# Patient Record
Sex: Female | Born: 1940
Health system: Southern US, Community
[De-identification: ages and names within clinical notes are randomized; demographics above are authoritative.]

## PROBLEM LIST (undated history)

## (undated) ENCOUNTER — Ambulatory Visit (HOSPITAL_COMMUNITY): Admission: EM | Discharge status: Absent without leave | Payer: Medicare Other

## (undated) DIAGNOSIS — S82209A Unspecified fracture of shaft of unspecified tibia, initial encounter for closed fracture: Secondary | ICD-10-CM

## (undated) DIAGNOSIS — B029 Zoster without complications: Secondary | ICD-10-CM

## (undated) DIAGNOSIS — N289 Disorder of kidney and ureter, unspecified: Secondary | ICD-10-CM

## (undated) DIAGNOSIS — K5790 Diverticulosis of intestine, part unspecified, without perforation or abscess without bleeding: Secondary | ICD-10-CM

## (undated) DIAGNOSIS — I10 Essential (primary) hypertension: Secondary | ICD-10-CM

## (undated) DIAGNOSIS — K219 Gastro-esophageal reflux disease without esophagitis: Secondary | ICD-10-CM

## (undated) DIAGNOSIS — K5792 Diverticulitis of intestine, part unspecified, without perforation or abscess without bleeding: Secondary | ICD-10-CM

## (undated) DIAGNOSIS — S329XXA Fracture of unspecified parts of lumbosacral spine and pelvis, initial encounter for closed fracture: Secondary | ICD-10-CM

## (undated) DIAGNOSIS — M545 Low back pain, unspecified: Secondary | ICD-10-CM

## (undated) DIAGNOSIS — S82409A Unspecified fracture of shaft of unspecified fibula, initial encounter for closed fracture: Secondary | ICD-10-CM

## (undated) DIAGNOSIS — R509 Fever, unspecified: Secondary | ICD-10-CM

## (undated) DIAGNOSIS — S36113A Laceration of liver, unspecified degree, initial encounter: Secondary | ICD-10-CM

## (undated) HISTORY — DX: Unspecified fracture of shaft of unspecified tibia, initial encounter for closed fracture: S82.209A

## (undated) HISTORY — DX: Diverticulitis of intestine, part unspecified, without perforation or abscess without bleeding: K57.92

## (undated) HISTORY — DX: Essential (primary) hypertension: I10

## (undated) HISTORY — DX: Fever, unspecified: R50.9

## (undated) HISTORY — DX: Unspecified fracture of shaft of unspecified fibula, initial encounter for closed fracture: S82.409A

## (undated) HISTORY — DX: Low back pain, unspecified: M54.50

## (undated) HISTORY — DX: Laceration of liver, unspecified degree, initial encounter: S36.113A

## (undated) HISTORY — DX: Gastro-esophageal reflux disease without esophagitis: K21.9

## (undated) HISTORY — PX: ABDOMINAL HYSTERECTOMY: SHX81

## (undated) HISTORY — DX: Diverticulosis of intestine, part unspecified, without perforation or abscess without bleeding: K57.90

## (undated) HISTORY — DX: Low back pain: M54.5

## (undated) HISTORY — DX: Zoster without complications: B02.9

## (undated) HISTORY — DX: Disorder of kidney and ureter, unspecified: N28.9

## (undated) HISTORY — DX: Fracture of unspecified parts of lumbosacral spine and pelvis, initial encounter for closed fracture: S32.9XXA

---

## 1999-05-12 DIAGNOSIS — Z8781 Personal history of (healed) traumatic fracture: Secondary | ICD-10-CM | POA: Insufficient documentation

## 1999-05-12 DIAGNOSIS — Z7982 Long term (current) use of aspirin: Secondary | ICD-10-CM | POA: Insufficient documentation

## 1999-05-12 DIAGNOSIS — Z86718 Personal history of other venous thrombosis and embolism: Secondary | ICD-10-CM | POA: Insufficient documentation

## 1999-05-12 DIAGNOSIS — Z95828 Presence of other vascular implants and grafts: Secondary | ICD-10-CM | POA: Insufficient documentation

## 2000-08-02 ENCOUNTER — Encounter: Payer: Self-pay | Admitting: Emergency Medicine

## 2000-08-02 ENCOUNTER — Emergency Department (HOSPITAL_COMMUNITY): Admission: EM | Admit: 2000-08-02 | Discharge: 2000-08-02 | Payer: Self-pay | Admitting: Emergency Medicine

## 2003-02-02 ENCOUNTER — Encounter: Payer: Self-pay | Admitting: Internal Medicine

## 2003-02-02 ENCOUNTER — Ambulatory Visit (HOSPITAL_COMMUNITY): Admission: RE | Admit: 2003-02-02 | Discharge: 2003-02-02 | Payer: Self-pay | Admitting: Internal Medicine

## 2003-02-23 ENCOUNTER — Emergency Department (HOSPITAL_COMMUNITY): Admission: EM | Admit: 2003-02-23 | Discharge: 2003-02-24 | Payer: Self-pay | Admitting: Emergency Medicine

## 2003-08-14 ENCOUNTER — Ambulatory Visit (HOSPITAL_COMMUNITY): Admission: RE | Admit: 2003-08-14 | Discharge: 2003-08-14 | Payer: Self-pay | Admitting: Family Medicine

## 2004-05-02 ENCOUNTER — Emergency Department (HOSPITAL_COMMUNITY): Admission: EM | Admit: 2004-05-02 | Discharge: 2004-05-02 | Payer: Self-pay | Admitting: Emergency Medicine

## 2004-05-11 DIAGNOSIS — S82209A Unspecified fracture of shaft of unspecified tibia, initial encounter for closed fracture: Secondary | ICD-10-CM

## 2004-05-11 DIAGNOSIS — S329XXA Fracture of unspecified parts of lumbosacral spine and pelvis, initial encounter for closed fracture: Secondary | ICD-10-CM

## 2004-05-11 DIAGNOSIS — S36113A Laceration of liver, unspecified degree, initial encounter: Secondary | ICD-10-CM

## 2004-05-11 HISTORY — DX: Unspecified fracture of shaft of unspecified tibia, initial encounter for closed fracture: S82.209A

## 2004-05-11 HISTORY — PX: TIBIA FRACTURE SURGERY: SHX806

## 2004-05-11 HISTORY — DX: Laceration of liver, unspecified degree, initial encounter: S36.113A

## 2004-05-11 HISTORY — DX: Fracture of unspecified parts of lumbosacral spine and pelvis, initial encounter for closed fracture: S32.9XXA

## 2004-05-26 ENCOUNTER — Encounter: Admission: RE | Admit: 2004-05-26 | Discharge: 2004-05-26 | Payer: Self-pay | Admitting: Orthopaedic Surgery

## 2004-12-24 ENCOUNTER — Ambulatory Visit: Payer: Self-pay | Admitting: Internal Medicine

## 2005-01-06 ENCOUNTER — Ambulatory Visit: Payer: Self-pay | Admitting: Gastroenterology

## 2005-01-15 ENCOUNTER — Ambulatory Visit: Payer: Self-pay | Admitting: Gastroenterology

## 2005-01-15 ENCOUNTER — Encounter (INDEPENDENT_AMBULATORY_CARE_PROVIDER_SITE_OTHER): Payer: Self-pay | Admitting: Specialist

## 2005-02-21 ENCOUNTER — Ambulatory Visit: Payer: Self-pay | Admitting: Physical Medicine & Rehabilitation

## 2005-02-21 ENCOUNTER — Inpatient Hospital Stay (HOSPITAL_COMMUNITY): Admission: EM | Admit: 2005-02-21 | Discharge: 2005-03-10 | Payer: Self-pay | Admitting: Emergency Medicine

## 2005-02-25 ENCOUNTER — Ambulatory Visit: Payer: Self-pay | Admitting: Plastic Surgery

## 2005-03-04 ENCOUNTER — Ambulatory Visit: Payer: Self-pay | Admitting: Internal Medicine

## 2005-03-10 ENCOUNTER — Inpatient Hospital Stay
Admission: RE | Admit: 2005-03-10 | Discharge: 2005-03-23 | Payer: Self-pay | Admitting: Physical Medicine & Rehabilitation

## 2005-05-19 ENCOUNTER — Ambulatory Visit: Payer: Self-pay | Admitting: Internal Medicine

## 2005-05-22 ENCOUNTER — Ambulatory Visit: Payer: Self-pay | Admitting: Internal Medicine

## 2005-05-25 ENCOUNTER — Ambulatory Visit: Payer: Self-pay | Admitting: Internal Medicine

## 2005-06-19 ENCOUNTER — Ambulatory Visit: Payer: Self-pay | Admitting: Internal Medicine

## 2005-07-09 ENCOUNTER — Ambulatory Visit: Payer: Self-pay | Admitting: Internal Medicine

## 2005-08-05 ENCOUNTER — Ambulatory Visit: Payer: Self-pay | Admitting: Internal Medicine

## 2005-08-11 ENCOUNTER — Ambulatory Visit: Payer: Self-pay | Admitting: Internal Medicine

## 2005-09-18 ENCOUNTER — Ambulatory Visit: Payer: Self-pay | Admitting: Internal Medicine

## 2005-10-24 ENCOUNTER — Emergency Department (HOSPITAL_COMMUNITY): Admission: EM | Admit: 2005-10-24 | Discharge: 2005-10-24 | Payer: Self-pay | Admitting: Emergency Medicine

## 2005-10-29 ENCOUNTER — Ambulatory Visit: Payer: Self-pay | Admitting: Internal Medicine

## 2005-11-10 ENCOUNTER — Ambulatory Visit: Payer: Self-pay | Admitting: Internal Medicine

## 2005-12-18 ENCOUNTER — Ambulatory Visit: Payer: Self-pay | Admitting: Internal Medicine

## 2005-12-30 ENCOUNTER — Ambulatory Visit: Payer: Self-pay | Admitting: Internal Medicine

## 2006-04-07 ENCOUNTER — Ambulatory Visit: Payer: Self-pay | Admitting: Internal Medicine

## 2006-04-15 ENCOUNTER — Ambulatory Visit: Payer: Self-pay | Admitting: Internal Medicine

## 2006-04-15 LAB — CONVERTED CEMR LAB
Bacteria, U Microscopic: NEGATIVE /hpf
Basophils Relative: 0.9 % (ref 0.0–1.0)
Crystals: NEGATIVE
Hemoglobin: 13.2 g/dL (ref 12.0–15.0)
Hgb A1c MFr Bld: 5.3 % (ref 4.6–6.0)
INR: 1 (ref 0.9–2.0)
MCHC: 33 g/dL (ref 30.0–36.0)
Monocytes Relative: 6.4 % (ref 3.0–11.0)
Mucus, UA: NEGATIVE
Platelets: 211 10*3/uL (ref 150–400)
Prothrombin Time: 12.5 s (ref 10.0–14.0)
RBC: 4.33 M/uL (ref 3.87–5.11)
RDW: 13.6 % (ref 11.5–14.6)
Sodium: 141 meq/L (ref 135–145)
Specific Gravity, Urine: 1.02 (ref 1.000–1.03)
TSH: 1.63 microintl units/mL (ref 0.35–5.50)
Urine Glucose: NEGATIVE mg/dL
Urobilinogen, UA: 0.2 (ref 0.0–1.0)

## 2006-07-07 ENCOUNTER — Ambulatory Visit: Payer: Self-pay | Admitting: Internal Medicine

## 2006-07-07 LAB — CONVERTED CEMR LAB
Bilirubin Urine: NEGATIVE
Chloride: 108 meq/L (ref 96–112)
Creatinine, Ser: 1.1 mg/dL (ref 0.4–1.2)
Glucose, Bld: 79 mg/dL (ref 70–99)
Hemoglobin, Urine: NEGATIVE
Leukocytes, UA: NEGATIVE
Potassium: 4.9 meq/L (ref 3.5–5.1)
Sodium: 144 meq/L (ref 135–145)
Total Protein, Urine: NEGATIVE mg/dL
Urine Glucose: NEGATIVE mg/dL

## 2007-01-05 ENCOUNTER — Ambulatory Visit: Payer: Self-pay | Admitting: Internal Medicine

## 2007-01-05 LAB — CONVERTED CEMR LAB
AST: 24 units/L (ref 0–37)
Bilirubin, Direct: 0.1 mg/dL (ref 0.0–0.3)
CO2: 25 meq/L (ref 19–32)
Chloride: 109 meq/L (ref 96–112)
Creatinine, Ser: 1.3 mg/dL — ABNORMAL HIGH (ref 0.4–1.2)
Glucose, Bld: 91 mg/dL (ref 70–99)
Hgb A1c MFr Bld: 5.7 % (ref 4.6–6.0)
Potassium: 4.7 meq/L (ref 3.5–5.1)
Sodium: 141 meq/L (ref 135–145)
TSH: 1.17 microintl units/mL (ref 0.35–5.50)
Total Bilirubin: 1.1 mg/dL (ref 0.3–1.2)
Total Protein: 7.5 g/dL (ref 6.0–8.3)

## 2007-04-27 ENCOUNTER — Ambulatory Visit: Payer: Self-pay | Admitting: Internal Medicine

## 2007-04-27 DIAGNOSIS — K219 Gastro-esophageal reflux disease without esophagitis: Secondary | ICD-10-CM | POA: Insufficient documentation

## 2007-04-27 DIAGNOSIS — E1121 Type 2 diabetes mellitus with diabetic nephropathy: Secondary | ICD-10-CM

## 2007-04-27 DIAGNOSIS — I10 Essential (primary) hypertension: Secondary | ICD-10-CM | POA: Insufficient documentation

## 2007-04-27 DIAGNOSIS — E1165 Type 2 diabetes mellitus with hyperglycemia: Secondary | ICD-10-CM | POA: Insufficient documentation

## 2007-04-27 LAB — CONVERTED CEMR LAB: Vit D, 1,25-Dihydroxy: 39 (ref 30–89)

## 2007-04-28 LAB — CONVERTED CEMR LAB
BUN: 14 mg/dL (ref 6–23)
Calcium: 10 mg/dL (ref 8.4–10.5)
Chloride: 108 meq/L (ref 96–112)
Creatinine, Ser: 1.4 mg/dL — ABNORMAL HIGH (ref 0.4–1.2)
Hgb A1c MFr Bld: 5.8 % (ref 4.6–6.0)
Potassium: 4.5 meq/L (ref 3.5–5.1)
Triglycerides: 199 mg/dL — ABNORMAL HIGH (ref 0–149)
VLDL: 40 mg/dL (ref 0–40)

## 2007-06-13 ENCOUNTER — Telehealth: Payer: Self-pay | Admitting: Internal Medicine

## 2007-10-21 ENCOUNTER — Encounter: Payer: Self-pay | Admitting: Internal Medicine

## 2007-11-02 ENCOUNTER — Ambulatory Visit: Payer: Self-pay | Admitting: Internal Medicine

## 2008-02-03 ENCOUNTER — Ambulatory Visit: Payer: Self-pay | Admitting: Internal Medicine

## 2008-02-03 LAB — CONVERTED CEMR LAB
AST: 24 units/L (ref 0–37)
Bilirubin, Direct: 0.2 mg/dL (ref 0.0–0.3)
Chloride: 109 meq/L (ref 96–112)
Creatinine, Ser: 1.4 mg/dL — ABNORMAL HIGH (ref 0.4–1.2)
GFR calc non Af Amer: 40 mL/min
Hgb A1c MFr Bld: 5.5 % (ref 4.6–6.0)
Sodium: 143 meq/L (ref 135–145)
Total Bilirubin: 1.2 mg/dL (ref 0.3–1.2)

## 2008-02-08 ENCOUNTER — Ambulatory Visit: Payer: Self-pay | Admitting: Internal Medicine

## 2008-05-11 DIAGNOSIS — B029 Zoster without complications: Secondary | ICD-10-CM

## 2008-05-11 HISTORY — DX: Zoster without complications: B02.9

## 2008-06-22 ENCOUNTER — Ambulatory Visit: Payer: Self-pay | Admitting: Internal Medicine

## 2008-06-22 LAB — CONVERTED CEMR LAB
CO2: 29 meq/L (ref 19–32)
Chloride: 107 meq/L (ref 96–112)
Creatinine, Ser: 1.4 mg/dL — ABNORMAL HIGH (ref 0.4–1.2)
Hgb A1c MFr Bld: 5.6 % (ref 4.6–6.0)
Sodium: 143 meq/L (ref 135–145)

## 2008-06-27 ENCOUNTER — Ambulatory Visit: Payer: Self-pay | Admitting: Internal Medicine

## 2008-06-27 DIAGNOSIS — E1121 Type 2 diabetes mellitus with diabetic nephropathy: Secondary | ICD-10-CM | POA: Insufficient documentation

## 2008-06-28 LAB — CONVERTED CEMR LAB
Mucus, UA: NEGATIVE
Nitrite: NEGATIVE
Specific Gravity, Urine: 1.015 (ref 1.000–1.03)
pH: 5.5 (ref 5.0–8.0)

## 2008-10-22 ENCOUNTER — Ambulatory Visit: Payer: Self-pay | Admitting: Internal Medicine

## 2008-10-22 LAB — CONVERTED CEMR LAB
ALT: 29 units/L (ref 0–35)
AST: 24 units/L (ref 0–37)
Basophils Absolute: 0 10*3/uL (ref 0.0–0.1)
Bilirubin, Direct: 0.1 mg/dL (ref 0.0–0.3)
Chloride: 107 meq/L (ref 96–112)
Creatinine, Ser: 1.3 mg/dL — ABNORMAL HIGH (ref 0.4–1.2)
Creatinine,U: 78.3 mg/dL
Direct LDL: 78.9 mg/dL
Hemoglobin: 13.3 g/dL (ref 12.0–15.0)
Lymphocytes Relative: 35.4 % (ref 12.0–46.0)
Microalb Creat Ratio: 5.1 mg/g (ref 0.0–30.0)
Microalb, Ur: 0.4 mg/dL (ref 0.0–1.9)
Monocytes Relative: 6.8 % (ref 3.0–12.0)
Neutro Abs: 4.1 10*3/uL (ref 1.4–7.7)
Neutrophils Relative %: 54 % (ref 43.0–77.0)
Potassium: 4.6 meq/L (ref 3.5–5.1)
RDW: 13.2 % (ref 11.5–14.6)
TSH: 1.28 microintl units/mL (ref 0.35–5.50)
Total Bilirubin: 1.3 mg/dL — ABNORMAL HIGH (ref 0.3–1.2)
Total CHOL/HDL Ratio: 4
Triglycerides: 201 mg/dL — ABNORMAL HIGH (ref 0.0–149.0)

## 2008-10-31 ENCOUNTER — Ambulatory Visit: Payer: Self-pay | Admitting: Internal Medicine

## 2008-11-07 ENCOUNTER — Encounter: Admission: RE | Admit: 2008-11-07 | Discharge: 2008-11-07 | Payer: Self-pay | Admitting: Internal Medicine

## 2008-11-15 ENCOUNTER — Encounter: Admission: RE | Admit: 2008-11-15 | Discharge: 2008-11-15 | Payer: Self-pay | Admitting: Internal Medicine

## 2008-11-23 ENCOUNTER — Encounter: Payer: Self-pay | Admitting: Internal Medicine

## 2009-02-19 ENCOUNTER — Ambulatory Visit: Payer: Self-pay | Admitting: Internal Medicine

## 2009-02-19 LAB — CONVERTED CEMR LAB
Calcium: 10.2 mg/dL (ref 8.4–10.5)
Chloride: 105 meq/L (ref 96–112)
Creatinine, Ser: 1.5 mg/dL — ABNORMAL HIGH (ref 0.4–1.2)
GFR calc non Af Amer: 44.26 mL/min (ref >60)
Hgb A1c MFr Bld: 5.5 % (ref 4.6–6.5)

## 2009-02-27 ENCOUNTER — Ambulatory Visit: Payer: Self-pay | Admitting: Internal Medicine

## 2009-03-09 ENCOUNTER — Emergency Department (HOSPITAL_COMMUNITY): Admission: EM | Admit: 2009-03-09 | Discharge: 2009-03-09 | Payer: Self-pay | Admitting: Emergency Medicine

## 2009-03-14 ENCOUNTER — Ambulatory Visit: Payer: Self-pay | Admitting: Internal Medicine

## 2009-03-14 DIAGNOSIS — R51 Headache: Secondary | ICD-10-CM

## 2009-03-14 DIAGNOSIS — R519 Headache, unspecified: Secondary | ICD-10-CM | POA: Insufficient documentation

## 2009-03-14 LAB — CONVERTED CEMR LAB
Basophils Absolute: 0.1 10*3/uL (ref 0.0–0.1)
CO2: 29 meq/L (ref 19–32)
Eosinophils Relative: 3.1 % (ref 0.0–5.0)
GFR calc non Af Amer: 52.2 mL/min (ref >60)
Glucose, Bld: 85 mg/dL (ref 70–99)
Lymphocytes Relative: 26.7 % (ref 12.0–46.0)
Monocytes Relative: 7.9 % (ref 3.0–12.0)
Neutrophils Relative %: 61 % (ref 43.0–77.0)
Platelets: 187 10*3/uL (ref 150.0–400.0)
Potassium: 4.1 meq/L (ref 3.5–5.1)
RDW: 13.2 % (ref 11.5–14.6)
Sodium: 143 meq/L (ref 135–145)
WBC: 8.4 10*3/uL (ref 4.5–10.5)

## 2009-03-20 ENCOUNTER — Ambulatory Visit: Payer: Self-pay | Admitting: Internal Medicine

## 2009-03-20 DIAGNOSIS — B029 Zoster without complications: Secondary | ICD-10-CM | POA: Insufficient documentation

## 2009-04-17 ENCOUNTER — Ambulatory Visit: Payer: Self-pay | Admitting: Internal Medicine

## 2009-04-22 ENCOUNTER — Telehealth: Payer: Self-pay | Admitting: Internal Medicine

## 2009-07-17 ENCOUNTER — Ambulatory Visit: Payer: Self-pay | Admitting: Internal Medicine

## 2009-07-17 DIAGNOSIS — R300 Dysuria: Secondary | ICD-10-CM | POA: Insufficient documentation

## 2009-07-17 LAB — CONVERTED CEMR LAB
Hemoglobin, Urine: NEGATIVE
Nitrite: NEGATIVE
Specific Gravity, Urine: 1.02 (ref 1.000–1.030)
Total Protein, Urine: NEGATIVE mg/dL
Urine Glucose: NEGATIVE mg/dL
Urobilinogen, UA: 0.2 (ref 0.0–1.0)

## 2009-07-18 LAB — CONVERTED CEMR LAB
ALT: 23 units/L (ref 0–35)
AST: 21 units/L (ref 0–37)
Albumin: 3.8 g/dL (ref 3.5–5.2)
BUN: 14 mg/dL (ref 6–23)
Chloride: 115 meq/L — ABNORMAL HIGH (ref 96–112)
Cholesterol: 164 mg/dL (ref 0–200)
Creatinine, Ser: 1.2 mg/dL (ref 0.4–1.2)
Glucose, Bld: 109 mg/dL — ABNORMAL HIGH (ref 70–99)
Hgb A1c MFr Bld: 5.7 % (ref 4.6–6.5)
LDL Cholesterol: 91 mg/dL (ref 0–99)
Potassium: 4.5 meq/L (ref 3.5–5.1)
Total Bilirubin: 1.2 mg/dL (ref 0.3–1.2)
Total Protein: 7.2 g/dL (ref 6.0–8.3)
Triglycerides: 141 mg/dL (ref 0.0–149.0)

## 2009-10-04 ENCOUNTER — Ambulatory Visit: Payer: Self-pay | Admitting: Internal Medicine

## 2009-10-04 ENCOUNTER — Encounter: Payer: Self-pay | Admitting: Internal Medicine

## 2009-12-02 ENCOUNTER — Ambulatory Visit: Payer: Self-pay | Admitting: Internal Medicine

## 2009-12-02 LAB — CONVERTED CEMR LAB
AST: 20 units/L (ref 0–37)
Albumin: 4 g/dL (ref 3.5–5.2)
CO2: 27 meq/L (ref 19–32)
Calcium: 9.4 mg/dL (ref 8.4–10.5)
Chloride: 105 meq/L (ref 96–112)
Glucose, Bld: 101 mg/dL — ABNORMAL HIGH (ref 70–99)
HDL: 40.8 mg/dL (ref >39.00)
Hgb A1c MFr Bld: 5.9 % (ref 4.6–6.5)
LDL Cholesterol: 105 mg/dL — ABNORMAL HIGH (ref 0–99)
Potassium: 4.2 meq/L (ref 3.5–5.1)
Sodium: 139 meq/L (ref 135–145)
Total Bilirubin: 1.4 mg/dL — ABNORMAL HIGH (ref 0.3–1.2)
Total CHOL/HDL Ratio: 4
Triglycerides: 167 mg/dL — ABNORMAL HIGH (ref 0.0–149.0)
VLDL: 33.4 mg/dL (ref 0.0–40.0)

## 2009-12-04 ENCOUNTER — Ambulatory Visit: Payer: Self-pay | Admitting: Internal Medicine

## 2010-01-16 ENCOUNTER — Encounter (INDEPENDENT_AMBULATORY_CARE_PROVIDER_SITE_OTHER): Payer: Self-pay | Admitting: *Deleted

## 2010-01-29 ENCOUNTER — Ambulatory Visit: Payer: Self-pay | Admitting: Internal Medicine

## 2010-01-29 DIAGNOSIS — M25519 Pain in unspecified shoulder: Secondary | ICD-10-CM

## 2010-01-29 DIAGNOSIS — M79609 Pain in unspecified limb: Secondary | ICD-10-CM | POA: Insufficient documentation

## 2010-03-26 ENCOUNTER — Ambulatory Visit: Payer: Self-pay | Admitting: Internal Medicine

## 2010-03-26 LAB — CONVERTED CEMR LAB
Albumin: 4.4 g/dL (ref 3.5–5.2)
BUN: 19 mg/dL (ref 6–23)
Basophils Absolute: 0 10*3/uL (ref 0.0–0.1)
CO2: 26 meq/L (ref 19–32)
Calcium: 10.3 mg/dL (ref 8.4–10.5)
Cholesterol: 192 mg/dL (ref 0–200)
Eosinophils Absolute: 0.2 10*3/uL (ref 0.0–0.7)
Glucose, Bld: 105 mg/dL — ABNORMAL HIGH (ref 70–99)
HCT: 40.2 % (ref 36.0–46.0)
HDL: 41.9 mg/dL (ref >39.00)
Hemoglobin, Urine: NEGATIVE
Hemoglobin: 13.8 g/dL (ref 12.0–15.0)
Hgb A1c MFr Bld: 5.9 % (ref 4.6–6.5)
Lymphs Abs: 2 10*3/uL (ref 0.7–4.0)
MCHC: 34.2 g/dL (ref 30.0–36.0)
Neutro Abs: 4.9 10*3/uL (ref 1.4–7.7)
Nitrite: NEGATIVE
Platelets: 188 10*3/uL (ref 150.0–400.0)
Potassium: 4.5 meq/L (ref 3.5–5.1)
RDW: 13.5 % (ref 11.5–14.6)
Sodium: 142 meq/L (ref 135–145)
TSH: 1.68 microintl units/mL (ref 0.35–5.50)
Total Bilirubin: 1.7 mg/dL — ABNORMAL HIGH (ref 0.3–1.2)
Urobilinogen, UA: 0.2 (ref 0.0–1.0)
VLDL: 34.2 mg/dL (ref 0.0–40.0)

## 2010-03-27 ENCOUNTER — Encounter (INDEPENDENT_AMBULATORY_CARE_PROVIDER_SITE_OTHER): Payer: Self-pay | Admitting: *Deleted

## 2010-03-28 ENCOUNTER — Telehealth: Payer: Self-pay | Admitting: Internal Medicine

## 2010-04-30 ENCOUNTER — Encounter (INDEPENDENT_AMBULATORY_CARE_PROVIDER_SITE_OTHER): Payer: Self-pay | Admitting: *Deleted

## 2010-05-06 ENCOUNTER — Ambulatory Visit: Payer: Self-pay | Admitting: Gastroenterology

## 2010-05-07 ENCOUNTER — Telehealth (INDEPENDENT_AMBULATORY_CARE_PROVIDER_SITE_OTHER): Payer: Self-pay | Admitting: *Deleted

## 2010-05-22 ENCOUNTER — Ambulatory Visit
Admission: RE | Admit: 2010-05-22 | Discharge: 2010-05-22 | Payer: Self-pay | Source: Home / Self Care | Attending: Gastroenterology | Admitting: Gastroenterology

## 2010-05-22 ENCOUNTER — Encounter: Payer: Self-pay | Admitting: Gastroenterology

## 2010-05-22 LAB — HM COLONOSCOPY

## 2010-05-26 LAB — GLUCOSE, CAPILLARY
Glucose-Capillary: 104 mg/dL — ABNORMAL HIGH (ref 70–99)
Glucose-Capillary: 94 mg/dL (ref 70–99)
Glucose-Capillary: 109 mg/dL — ABNORMAL HIGH (ref 70–99)

## 2010-06-10 NOTE — Letter (Signed)
Summary: Pre Visit Letter Revised  [facility]  [place], [place] [address]   Phone: [phone]  Fax: [phone]        03/27/2010 MRN: [id number]    [place],   [address]             Procedure Date: May 22, 2010   Welcome to the Gastroenterology Division at [facility].    You are scheduled to see a nurse for your pre-procedure visit on May 06, 2010 at 10:00 A.M.  on the 3rd floor at [facility], [facility].  We ask that you try to arrive at our office 15 minutes prior to your appointment time to allow for check-in.  Please take a minute to review the attached form.  If you answer "Yes" to one or more of the questions on the first page, we ask that you call the person listed at your earliest opportunity.  If you answer "No" to all of the questions, please complete the rest of the form and bring it to your appointment.    Your nurse visit will consist of discussing your medical and surgical history, your immediate family medical history, and your medications.   If you are unable to list all of your medications on the form, please bring the medication bottles to your appointment and we will list them.  We will need to be aware of both prescribed and over the counter drugs.  We will need to know exact dosage information as well.    Please be prepared to read and sign documents such as consent forms, a financial agreement, and acknowledgement forms.  If necessary, and with your consent, a friend or relative is welcome to sit-in on the nurse visit with you.  Please bring your insurance card so that we may make a copy of it.  If your insurance requires a referral to see a specialist, please bring your referral form from your primary care physician.  No co-pay is required for this nurse visit.     If you cannot keep your appointment, please call [phone] to cancel or reschedule prior to your appointment  date.  This allows [place] the opportunity to schedule an appointment for another patient in need of care.    Thank you for choosing [facility] for your medical needs.  We appreciate the opportunity to care for you.  Please visit [place] at our website  to learn more about our practice.  Sincerely, The Gastroenterology Division
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
## 2010-06-10 NOTE — Progress Notes (Signed)
  Phone Note Call from Patient Call back at Home Phone [phone]   Caller: Patient Summary of Call: Patient lmovm requesting lab results. Please advise Thanks Initial call taken by: Estell Harpin CMA,  March 28, 2010 1:36 PM  Follow-up for Phone Call        Labs are OK Follow-up by: Cassandria Anger MD,  March 28, 2010 2:27 PM  Additional Follow-up for Phone Call Additional follow up Details #1::        Patient notified per MD..Lakisha Archie CMA  March 28, 2010 2:33 PM
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
## 2010-06-10 NOTE — Letter (Signed)
Summary: Colonoscopy Letter  [facility]  [place], [place] [address]   Phone: [phone]  Fax: [phone]      January 16, 2010 MRN: [id number]   [place], [place]  [address]   Dear [name],   According to your medical record, it is time for you to schedule a Colonoscopy. The American Cancer Society recommends this procedure as a method to detect early colon cancer. Patients with a family history of colon cancer, or a personal history of colon polyps or inflammatory bowel disease are at increased risk.  This letter has beeen generated based on the recommendations made at the time of your procedure. If you feel that in your particular situation this may no longer apply, please contact our office.  Please call our office at [phone] to schedule this appointment or to update your records at your earliest convenience.  Thank you for cooperating with [place] to provide you with the very best care possible.   Sincerely,  Norberto Sorenson T. Fuller Plan, M.D.  [facility]ology [facility] [phone]
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
## 2010-06-10 NOTE — Miscellaneous (Signed)
Summary: [facility]   Imported By: Bubba Hales 10/09/2009 11:58:42  _____________________________________________________________________  External Attachment:    Type:   Image     Comment:   External Document

## 2010-06-12 NOTE — Procedures (Addendum)
Summary: Colonoscopy  Patient: Brandy Gonzales Note: All result statuses are Final unless otherwise noted.  Tests: (1) Colonoscopy (COL)   COL Colonoscopy           [facility]     [place], [place]  [address]           COLONOSCOPY PROCEDURE REPORT     PATIENT:  Brandy Gonzales  MR#:  [id number]     BIRTHDATE:  [birth year], 48 yrs. old  GENDER:  female     ENDOSCOPIST:  Brandy Sorenson T. Fuller Plan, MD, [facility]           PROCEDURE DATE:  05/22/2010     PROCEDURE:  Colonoscopy [id number]     ASA CLASS:  Class II     INDICATIONS:  1) Elevated Risk Screening  2) family history of     colon cancer: brother at 83.     MEDICATIONS:   Fentanyl 62.5 mcg IV, Versed 6 mg IV     DESCRIPTION OF PROCEDURE:   After the risks benefits and     alternatives of the procedure were thoroughly explained, informed     consent was obtained.  Digital rectal exam was performed and     revealed no abnormalities.   The LB [id number] endoscope     was introduced through the anus and advanced to the cecum, which     was identified by both the appendix and ileocecal valve, without     limitations.  The quality of the prep was excellent, using     MoviPrep.  The instrument was then slowly withdrawn as the colon     was fully examined.     <<PROCEDUREIMAGES>>     FINDINGS:  Mild diverticulosis was found in the sigmoid colon.     Diverticulitis was found in the sigmoid colon. A normal appearing     cecum, ileocecal valve, and appendiceal orifice were identified.     The ascending, hepatic flexure, transverse, splenic flexure,     descending colon, and rectum appeared unremarkable. Retroflexed     views in the rectum revealed no abnormalities. The time to cecum =     1.67  minutes. The scope was then withdrawn (time =  7.5  min)     from the patient and the procedure completed.           COMPLICATIONS:  None           ENDOSCOPIC IMPRESSION:     1) Mild diverticulosis in the sigmoid colon  2) Diverticulitis in the sigmoid colon           RECOMMENDATIONS:     1) High fiber diet with liberal fluid intake.     2) Repeat Colonoscopy in 5 years.     3) Cipro 500mg  po bid, #14           Brandy Gonzales T. Fuller Plan, MD, Brandy Gonzales           CC: Brandy Dixon. [name], MD           n.     Lorrin MaisNorberto Sorenson T. Khup Gonzales at 05/22/2010 11:38 AM           Brandy Gonzales, [id number]  Note: An exclamation mark (!) indicates a result that was not dispersed into the flowsheet. Document Creation Date: 05/22/2010 11:38 AM _______________________________________________________________________  (1) Order result status: Final Collection or observation date-time: 05/22/2010 11:32 Requested date-time:  Receipt date-time:  Reported date-time:  Referring Physician:   Ordering Physician: Brandy Gonzales [phone]) Specimen Source:  Source: Brandy Gonzales Order Number: [id number] Lab site:   Appended Document: Colonoscopy    Clinical Lists Changes  Observations: Added new observation of COLONNXTDUE: 05/2015 (05/22/2010 14:07)      Appended Document: Colonoscopy Recall     Procedures Next Due Date:    Colonoscopy: 06/2015

## 2010-06-12 NOTE — Miscellaneous (Signed)
Summary: cipro rx.  Clinical Lists Changes  Medications: Added new medication of CIPROFLOXACIN HCL 500 MG  TABS (CIPROFLOXACIN HCL) take 1 tab by mouth twice a day times 7 days. - Signed Rx of CIPROFLOXACIN HCL 500 MG  TABS (CIPROFLOXACIN HCL) take 1 tab by mouth twice a day times 7 days.;  #14 x 0;  Signed;  Entered by: Salome Arnt RN;  Authorized by: Ladene Artist MD Marval Regal;  Method used: Electronically to [facility] [phone]*, [address], [place], [place]  [address], Ph: [phone], Fax: [phone]    Prescriptions: CIPROFLOXACIN HCL 500 MG  TABS (CIPROFLOXACIN HCL) take 1 tab by mouth twice a day times 7 days.  #14 x 0   Entered by:   Salome Arnt RN   Authorized by:   Ladene Artist MD [facility]   Signed by:   Salome Arnt RN on 05/22/2010   Method used:   Electronically to        [facility] [phone]* (retail)       [address]       [place], [place]  [address]       Ph: [phone]       Fax: [phone]   [place]:   [phone]

## 2010-06-12 NOTE — Miscellaneous (Signed)
Summary: [facility] Previsit/prep  Clinical Lists Changes  Medications: Added new medication of MOVIPREP 100 GM  SOLR (PEG-KCL-NACL-NASULF-NA ASC-C) As per prep instructions. - Signed Rx of MOVIPREP 100 GM  SOLR (PEG-KCL-NACL-NASULF-NA ASC-C) As per prep instructions.;  #1 x 0;  Signed;  Entered by: Emerson Monte RN;  Authorized by: Ladene Artist MD [facility];  Method used: [facility] [address] [id number]*, [address], [place], [place], [place]  [address], Ph: [phone], Fax: [phone] Observations: Added new observation of ALLERGY REV: Done (05/06/2010 9:19)    Prescriptions: MOVIPREP 100 GM  SOLR (PEG-KCL-NACL-NASULF-NA ASC-C) As per prep instructions.  #1 x 0   Entered by:   Emerson Monte RN   Authorized by:   Ladene Artist MD [facility]   Signed by:   Emerson Monte RN on 05/06/2010   Method used:   [facility]        [facility]  [address] [id number][facility] (retail)       [address]       [place], [place]  [address]       Ph: [phone]       Fax: [phone]   RxID:   [id number]

## 2010-06-12 NOTE — Letter (Signed)
Summary: [facility] Instructions  [facility]  [place], [place] [address]   Phone: [phone]  Fax: [phone]       Brandy Gonzales    21-Mar-1941    MRN: [id number]        Procedure Day /Date:  Thursday 05/22/2010     Arrival Time: 10:00 am     Procedure Time:  11:00 am     Location of Procedure:                    _ x_  [place] (4th Floor)                        [place]   Starting 5 days prior to your procedure Saturday 05/17/10 do not eat nuts, seeds, popcorn, corn, beans, peas,  salads, or any raw vegetables.  Do not take any fiber supplements (e.g. Metamucil, Citrucel, and Benefiber).  THE DAY BEFORE YOUR PROCEDURE         DATE: Wednesday 05/21/10  1.  Drink clear liquids the entire day-NO SOLID FOOD  2.  Do not drink anything colored red or purple.  Avoid juices with pulp.  No orange juice.  3.  Drink at least 64 oz. (8 glasses) of fluid/clear liquids during the day to prevent dehydration and help the prep work efficiently.  CLEAR LIQUIDS INCLUDE: Water Jello Ice Popsicles Tea (sugar ok, no milk/cream) Powdered fruit flavored drinks Coffee (sugar ok, no milk/cream) Gatorade Juice: apple, white grape, white cranberry  Lemonade Clear bullion, consomm, broth Carbonated beverages (any kind) Strained chicken noodle soup Hard Candy                             4.  In the morning, mix first dose of MoviPrep solution:    Empty 1 Pouch A and 1 Pouch B into the disposable container    Add lukewarm drinking water to the top line of the container. Mix to dissolve    Refrigerate (mixed solution should be used within 24 hrs)  5.  Begin drinking the prep at 5:00 p.m. The MoviPrep container is divided by 4 marks.   Every 15 minutes drink the solution down to the next mark (approximately 8 oz) until the full liter is complete.   6.  Follow completed prep with 16 oz of clear liquid of your choice  (Nothing red or purple).  Continue to drink clear liquids until bedtime.  7.  Before going to bed, mix second dose of MoviPrep solution:    Empty 1 Pouch A and 1 Pouch B into the disposable container    Add lukewarm drinking water to the top line of the container. Mix to dissolve    Refrigerate  THE DAY OF YOUR PROCEDURE      DATE: Thursday 05/22/10  Beginning at 6:00 a.m. (5 hours before procedure):         1. Every 15 minutes, drink the solution down to the next mark (approx 8 oz) until the full liter is complete.  2. Follow completed prep with 16 oz. of clear liquid of your choice.    3. You may drink clear liquids until 9:00 am (2 HOURS BEFORE PROCEDURE).   MEDICATION INSTRUCTIONS  Unless otherwise instructed, you should take regular prescription medications with a small sip of water   as early as possible the morning of  your procedure.  Diabetic patients - see separate instructions.   Additional medication instructions: Hold Aldactone the morning of procedure.         OTHER INSTRUCTIONS  You will need a responsible adult at least 71 years of age to accompany you and drive you home.   This person must remain in the waiting room during your procedure.  Wear loose fitting clothing that is easily removed.  Leave jewelry and other valuables at home.  However, you may wish to bring a book to read or  an iPod/MP3 player to listen to music as you wait for your procedure to start.  Remove all body piercing jewelry and leave at home.  Total time from sign-in until discharge is approximately 2-3 hours.  You should go home directly after your procedure and rest.  You can resume normal activities the  day after your procedure.  The day of your procedure you should not:   Drive   Make legal decisions   Operate machinery   Drink alcohol   Return to work  You will receive specific instructions about eating, activities and medications before you leave.    The  above instructions have been reviewed and explained to me by   Emerson Monte RN  May 06, 2010 9:47 AM     I fully understand and can verbalize these instructions _____________________________ Date _________

## 2010-06-12 NOTE — Progress Notes (Signed)
Summary: Prep called in to wrong Rx  Phone Note Call from Patient Call back at Home Phone [phone]   Call For: Dr Fuller Plan Summary of Call: Says she was not asked which pharmacy she uses. Would like her prep called in to [facility] [address]. Initial call taken by: Irwin Brakeman [facility],  May 07, 2010 10:14 AM  Follow-up for Phone Call        Rx for [place] called in to [facility] [address].  Pt notified. Follow-up by: Ulice Dash RN,  May 07, 2010 11:22 AM

## 2010-06-12 NOTE — Letter (Signed)
Summary: Diabetic Instructions  [facility]  [place], [place] [address]   Phone: [phone]  Fax: [phone]    Brandy Gonzales January 28, 1941 MRN: [id number]   _x  _   ORAL DIABETIC MEDICATION INSTRUCTIONS  The day before your procedure:   Take your diabetic pill as you do normally  The day of your procedure:   Do not take your diabetic pill    We will check your blood sugar levels during the admission process and again in Recovery before discharging you home  ________________________________________________________________________

## 2010-07-23 ENCOUNTER — Other Ambulatory Visit: Payer: Self-pay

## 2010-07-30 ENCOUNTER — Ambulatory Visit: Payer: Self-pay | Admitting: Internal Medicine

## 2010-08-21 ENCOUNTER — Other Ambulatory Visit: Payer: Self-pay | Admitting: Internal Medicine

## 2010-08-21 ENCOUNTER — Other Ambulatory Visit (INDEPENDENT_AMBULATORY_CARE_PROVIDER_SITE_OTHER): Payer: Self-pay

## 2010-08-21 DIAGNOSIS — E119 Type 2 diabetes mellitus without complications: Secondary | ICD-10-CM

## 2010-08-21 LAB — BASIC METABOLIC PANEL
BUN: 16 mg/dL (ref 6–23)
CO2: 26 mEq/L (ref 19–32)
Chloride: 107 mEq/L (ref 96–112)
Creatinine, Ser: 1.3 mg/dL — ABNORMAL HIGH (ref 0.4–1.2)
Glucose, Bld: 87 mg/dL (ref 70–99)

## 2010-08-25 ENCOUNTER — Encounter: Payer: Self-pay | Admitting: Internal Medicine

## 2010-08-25 ENCOUNTER — Ambulatory Visit (INDEPENDENT_AMBULATORY_CARE_PROVIDER_SITE_OTHER): Payer: Medicare Other | Admitting: Internal Medicine

## 2010-08-25 DIAGNOSIS — N259 Disorder resulting from impaired renal tubular function, unspecified: Secondary | ICD-10-CM

## 2010-08-25 DIAGNOSIS — E119 Type 2 diabetes mellitus without complications: Secondary | ICD-10-CM

## 2010-08-25 DIAGNOSIS — M79609 Pain in unspecified limb: Secondary | ICD-10-CM

## 2010-08-25 DIAGNOSIS — R300 Dysuria: Secondary | ICD-10-CM

## 2010-08-25 DIAGNOSIS — K219 Gastro-esophageal reflux disease without esophagitis: Secondary | ICD-10-CM

## 2010-08-25 NOTE — Progress Notes (Signed)
  Subjective:    Patient ID: Brandy Gonzales, female    DOB: Jan 15, 1941, 70 y.o.   MRN: [id number]  HPI  The patient presents for a follow-up of  chronic hypertension, chronic dyslipidemia, GERD and L knee - controlled with medicines    Review of Systems  Constitutional: Positive for activity change.  HENT: Negative for sneezing.   Eyes: Negative for pain.  Respiratory: Negative for cough.   Musculoskeletal: Negative for back pain.  Neurological: Negative for numbness.       Objective:   Physical Exam  Constitutional: She appears well-developed and well-nourished. No distress.  HENT:  Head: Normocephalic.  Right Ear: External ear normal.  Left Ear: External ear normal.  Nose: Nose normal.  Mouth/Throat: Oropharynx is clear and moist.  Eyes: Conjunctivae are normal. Pupils are equal, round, and reactive to light. Right eye exhibits no discharge. Left eye exhibits no discharge.  Neck: Normal range of motion. Neck supple. No JVD present. No tracheal deviation present. No thyromegaly present.  Cardiovascular: Normal rate, regular rhythm and normal heart sounds.   Pulmonary/Chest: No stridor. No respiratory distress. She has no wheezes.  Abdominal: Soft. Bowel sounds are normal. She exhibits no distension and no mass. There is no tenderness. There is no rebound and no guarding.  Musculoskeletal: She exhibits no edema and no tenderness.  Lymphadenopathy:    She has no cervical adenopathy.  Neurological: She displays normal reflexes. No cranial nerve deficit. She exhibits normal muscle tone. Coordination normal.  Skin: No rash noted. No erythema.  Psychiatric: She has a normal mood and affect. Her behavior is normal. Judgment and thought content normal.        Lab Results  Component Value Date   WBC 7.7 03/26/2010   HGB 13.8 03/26/2010   HCT 40.2 03/26/2010   PLT 188.0 03/26/2010   CHOL 192 03/26/2010   TRIG 171.0* 03/26/2010   HDL 41.90 03/26/2010   LDLDIRECT 78.9 10/22/2008     ALT 28 03/26/2010   AST 24 03/26/2010   NA 140 08/21/2010   K 4.5 08/21/2010   CL 107 08/21/2010   CREATININE 1.3* 08/21/2010   BUN 16 08/21/2010   CO2 26 08/21/2010   TSH 1.68 03/26/2010   INR 1.0 RATIO 04/15/2006   HGBA1C 5.9 08/21/2010   MICROALBUR 0.4 10/22/2008     Assessment & Plan:  DIABETES MELLITUS, TYPE II Diet controlled  Dysuria No recurrence  RENAL INSUFFICIENCY Labs reviewed. We are watching creat  LEG PAIN On meds prn  GERD On Rx

## 2010-08-25 NOTE — Assessment & Plan Note (Signed)
On meds prn 

## 2010-08-25 NOTE — Assessment & Plan Note (Signed)
On Rx 

## 2010-08-25 NOTE — Assessment & Plan Note (Signed)
Diet controlled.  

## 2010-08-25 NOTE — Assessment & Plan Note (Signed)
No recurrence. 

## 2010-08-25 NOTE — Assessment & Plan Note (Signed)
Labs reviewed. We are watching creat
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
## 2010-09-26 NOTE — Op Note (Signed)
NAME:  Brandy Gonzales, Brandy Gonzales                  ACCOUNT NO.:  [id number]   MEDICAL RECORD NO.:  [id number]          PATIENT TYPE:  INP   LOCATION:  5010                         FACILITY:  [facility]   PHYSICIAN:  Jessy Oto, M.D.   DATE OF BIRTH:  Jul 13, 1940   DATE OF PROCEDURE:  02/24/2005  DATE OF DISCHARGE:                                 OPERATIVE REPORT   PREOPERATIVE DIAGNOSIS:  1.  Left tibia segmental comminuted fracture with associated segmental      fibula fracture and medial malleolus fracture, status post      intramedullary nail of the tibia with proximal and distal interlocking      screws.  2.  Internal fixation of mid and distal fibula fracture sites.  Unable to      close the open reduction and internal fixation incision of the fibula      two days ago.   POSTOPERATIVE DIAGNOSES:  1.  Left tibia segmental comminuted fracture with associated segmental      fibula fracture and medial malleolus fracture, status post      intramedullary nail of the tibia with proximal and distal interlocking      screws.  2.  Internal fixation of mid and distal fibula fracture sites.  Unable to      close the open reduction and internal fixation incision of the fibula      two days ago.   OPERATION/PROCEDURE:  1.  Irrigation of left open incision overlying the left fibula open      reduction and internal fixation site.  2.  Attempted closure, then placement of V.A.C.   SURGEON:  Jessy Oto, M.D.   ANESTHESIA:  General orotracheal tube, Dr. Tobias Alexander.   ESTIMATED BLOOD LOSS:  50 mL.   DRAINS:  Foley the straight drain.  V.A.C. to closed suction apparatus   TOTAL TOURNIQUET TIME:  200 mmHg for 15 minutes.   BRIEF CLINICAL HISTORY:  The patient is a 70 year old female who underwent  open reduction and internal fixation of left fibula fractures of both mid  shaft fibula and distal one-third fibula along with ORIF of medial malleolus  fracture and intramedullary nailing of a comminuted  segmental tibia fracture  two days ago.  At the end of ORIF of the fibula, unable to close the wound  overlying the anterior compartment.  She was brought back to the operating  room now two days out in an attempt to obtain a primary closure.   OPERATIVE FINDINGS:  The patient had persistent swelling within the anterior  compartment, unable to approximate the edges of the incision without sutures  pulling out of the subcutaneous soft tissues.  Therefore, V.A.C. was placed.  The patient will undergo V.A.C. treatment until the wound is appropriate for  closure or for full-thickness skin grafting.   DESCRIPTION OF PROCEDURE:  After adequate general anesthesia, the patient's  left lower extremity was prepped with Betadine scrub and prep solution with  a bump under the left buttock.  Draped in the usual manner.  The previous  rubber bands and staples used  to attempt to obtain some elastic attempts at  closure of the wound while in dressing had approximated the wound edges to  about 1-1.5 cm one another.  However, with their release, the wound spread  widely.  This was then carefully scrubbed with Betadine scrub and prep  solution and she was draped in the usual manner.  Irrigation was performed  of the incision site over the lateral aspect of the left leg.  No attempts  were made to lift the flap of muscle that was covering the ORIF plate.  This  was left in place.  It was felt to be well secured over and covering the  plate site.  With this then, attempts were made to close the wound  primarily, approximating the skin edges and then placing subcutaneous  sutures and this was unsuccessful using 0 Vicryl sutures.  Therefore, this  was abandoned and the V.A.C. was applied by configuring a sponge to the  surface of the open wound and coating the peri-incision site with tincture  of benzoin and then applying the V.A.C. with Op-Site-like material placed,  then cut over the sponge and the V.A.C.  external portal, then placed without  difficulty.  This obtained excellent suction.  This was attached then to the  V.A.C. closed suction container set at 125 mmHg.  The patient then had  dressings applied to the incisions over the proximal anterior aspect of the  tibia and medially as well as over the distal medial anterior tibia using  Xeroform gauze, 4 x 4's, and well-padded posterior plaster splint was then  applied from the ball of the foot to the posterior proximal leg.  Ace wraps  were used.  The patient was then reactivated and extubated, returned to the  recovery room in satisfactory condition.  Sponge counts were correct.      Jessy Oto, M.D.  Electronically Signed     JEN/MEDQ  D:  02/24/2005  T:  02/25/2005  Job:  [id number]
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
## 2010-12-25 ENCOUNTER — Telehealth: Payer: Self-pay | Admitting: Internal Medicine

## 2010-12-25 ENCOUNTER — Other Ambulatory Visit (INDEPENDENT_AMBULATORY_CARE_PROVIDER_SITE_OTHER): Payer: Medicare Other

## 2010-12-25 ENCOUNTER — Other Ambulatory Visit (INDEPENDENT_AMBULATORY_CARE_PROVIDER_SITE_OTHER): Payer: Medicare Other | Admitting: Internal Medicine

## 2010-12-25 ENCOUNTER — Other Ambulatory Visit: Payer: Medicare Other

## 2010-12-25 ENCOUNTER — Ambulatory Visit (INDEPENDENT_AMBULATORY_CARE_PROVIDER_SITE_OTHER): Payer: Medicare Other | Admitting: Internal Medicine

## 2010-12-25 ENCOUNTER — Encounter: Payer: Self-pay | Admitting: Internal Medicine

## 2010-12-25 DIAGNOSIS — N259 Disorder resulting from impaired renal tubular function, unspecified: Secondary | ICD-10-CM

## 2010-12-25 DIAGNOSIS — I10 Essential (primary) hypertension: Secondary | ICD-10-CM

## 2010-12-25 DIAGNOSIS — Z0000 Encounter for general adult medical examination without abnormal findings: Secondary | ICD-10-CM

## 2010-12-25 DIAGNOSIS — E119 Type 2 diabetes mellitus without complications: Secondary | ICD-10-CM

## 2010-12-25 LAB — URINALYSIS, ROUTINE W REFLEX MICROSCOPIC
Bilirubin Urine: NEGATIVE
Hgb urine dipstick: NEGATIVE
Ketones, ur: NEGATIVE
Urine Glucose: NEGATIVE
Urobilinogen, UA: 0.2 (ref 0.0–1.0)

## 2010-12-25 LAB — BASIC METABOLIC PANEL
CO2: 26 mEq/L (ref 19–32)
Calcium: 10 mg/dL (ref 8.4–10.5)
Glucose, Bld: 117 mg/dL — ABNORMAL HIGH (ref 70–99)
Sodium: 139 mEq/L (ref 135–145)

## 2010-12-25 LAB — MICROALBUMIN / CREATININE URINE RATIO
Creatinine,U: 75.5 mg/dL
Microalb, Ur: 0.6 mg/dL (ref 0.0–1.9)

## 2010-12-25 MED ORDER — SPIRONOLACTONE 50 MG PO TABS: 50.0000 mg | ORAL_TABLET | Freq: Every day | ORAL | Status: DC

## 2010-12-25 MED ORDER — GLIMEPIRIDE 2 MG PO TABS: 2.0000 mg | ORAL_TABLET | Freq: Every day | ORAL | Status: DC

## 2010-12-25 MED ORDER — CARVEDILOL 25 MG PO TABS: 25.0000 mg | ORAL_TABLET | Freq: Two times a day (BID) | ORAL | Status: DC

## 2010-12-25 NOTE — Telephone Encounter (Signed)
Erline Levine, please, inform patient that all labs are normal  Thx

## 2010-12-25 NOTE — Assessment & Plan Note (Signed)
On Rx 

## 2010-12-25 NOTE — Assessment & Plan Note (Signed)
Monitoring

## 2010-12-25 NOTE — Assessment & Plan Note (Signed)
CBGs are OK per pt

## 2010-12-26 NOTE — Telephone Encounter (Signed)
Patient informed. 

## 2010-12-27 NOTE — Progress Notes (Signed)
  Subjective:    Patient ID: Brandy Gonzales, female    DOB: [birth year], 70 y.o.   MRN: [id number]  HPI  The patient presents for a follow-up of  chronic hypertension, chronic dyslipidemia, type 2 diabetes controlled with medicines    Review of Systems  Constitutional: Negative for chills, activity change, appetite change, fatigue and unexpected weight change.  HENT: Negative for congestion, mouth sores and sinus pressure.   Eyes: Negative for visual disturbance.  Respiratory: Negative for cough and chest tightness.   Gastrointestinal: Negative for nausea and abdominal pain.  Genitourinary: Negative for frequency, difficulty urinating and vaginal pain.  Musculoskeletal: Positive for gait problem (due to knee pain). Negative for back pain.  Skin: Negative for pallor and rash.  Neurological: Negative for dizziness, tremors, weakness, numbness and headaches.  Psychiatric/Behavioral: Negative for confusion and sleep disturbance.       Objective:   Physical Exam  Constitutional: She appears well-developed and well-nourished. No distress.  HENT:  Head: Normocephalic.  Right Ear: External ear normal.  Left Ear: External ear normal.  Nose: Nose normal.  Mouth/Throat: Oropharynx is clear and moist.  Eyes: Conjunctivae are normal. Pupils are equal, round, and reactive to light. Right eye exhibits no discharge. Left eye exhibits no discharge.  Neck: Normal range of motion. Neck supple. No JVD present. No tracheal deviation present. No thyromegaly present.  Cardiovascular: Normal rate, regular rhythm and normal heart sounds.   Pulmonary/Chest: No stridor. No respiratory distress. She has no wheezes.  Abdominal: Soft. Bowel sounds are normal. She exhibits no distension and no mass. There is no tenderness. There is no rebound and no guarding.  Musculoskeletal: She exhibits no edema.  Lymphadenopathy:    She has no cervical adenopathy.  Neurological: She displays normal reflexes. No cranial  nerve deficit. She exhibits normal muscle tone. Coordination normal.  Skin: No rash noted. No erythema.  Psychiatric: She has a normal mood and affect. Her behavior is normal. Judgment and thought content normal.          Assessment & Plan:

## 2011-01-10 ENCOUNTER — Ambulatory Visit (INDEPENDENT_AMBULATORY_CARE_PROVIDER_SITE_OTHER): Payer: Medicare Other

## 2011-01-10 ENCOUNTER — Inpatient Hospital Stay (INDEPENDENT_AMBULATORY_CARE_PROVIDER_SITE_OTHER)
Admission: RE | Admit: 2011-01-10 | Discharge: 2011-01-10 | Disposition: A | Discharge status: Home / Self Care | Payer: Medicare Other | Source: Ambulatory Visit | Attending: Family Medicine | Admitting: Family Medicine

## 2011-01-10 DIAGNOSIS — R10811 Right upper quadrant abdominal tenderness: Secondary | ICD-10-CM

## 2011-01-10 LAB — DIFFERENTIAL
Eosinophils Absolute: 0.3 10*3/uL (ref 0.0–0.7)
Lymphs Abs: 2.6 10*3/uL (ref 0.7–4.0)
Monocytes Absolute: 0.7 10*3/uL (ref 0.1–1.0)
Monocytes Relative: 8 % (ref 3–12)
Neutrophils Relative %: 55 % (ref 43–77)

## 2011-01-10 LAB — POCT URINALYSIS DIP (DEVICE)
Bilirubin Urine: NEGATIVE
Glucose, UA: NEGATIVE mg/dL
Hgb urine dipstick: NEGATIVE
Ketones, ur: NEGATIVE mg/dL
Nitrite: NEGATIVE
Protein, ur: NEGATIVE mg/dL
Specific Gravity, Urine: 1.01 (ref 1.005–1.030)
Urobilinogen, UA: 0.2 mg/dL (ref 0.0–1.0)
pH: 6 (ref 5.0–8.0)

## 2011-01-10 LAB — CBC
HCT: 37.3 % (ref 36.0–46.0)
Hemoglobin: 13 g/dL (ref 12.0–15.0)
MCH: 31.1 pg (ref 26.0–34.0)
MCHC: 34.9 g/dL (ref 30.0–36.0)
MCV: 89.2 fL (ref 78.0–100.0)
Platelets: 178 K/uL (ref 150–400)
RBC: 4.18 MIL/uL (ref 3.87–5.11)
RDW: 13.2 % (ref 11.5–15.5)
WBC: 7.9 K/uL (ref 4.0–10.5)

## 2011-01-10 LAB — POCT I-STAT, CHEM 8
BUN: 23 mg/dL (ref 6–23)
Calcium, Ion: 1.28 mmol/L (ref 1.12–1.32)
Chloride: 111 meq/L (ref 96–112)
Creatinine, Ser: 1.7 mg/dL — ABNORMAL HIGH (ref 0.50–1.10)
Glucose, Bld: 98 mg/dL (ref 70–99)
HCT: 40 % (ref 36.0–46.0)
Hemoglobin: 13.6 g/dL (ref 12.0–15.0)
Potassium: 4.3 meq/L (ref 3.5–5.1)
Sodium: 143 meq/L (ref 135–145)
TCO2: 23 mmol/L (ref 0–100)

## 2011-01-15 ENCOUNTER — Other Ambulatory Visit (INDEPENDENT_AMBULATORY_CARE_PROVIDER_SITE_OTHER): Payer: Medicare Other

## 2011-01-15 ENCOUNTER — Encounter: Payer: Self-pay | Admitting: Internal Medicine

## 2011-01-15 ENCOUNTER — Ambulatory Visit (INDEPENDENT_AMBULATORY_CARE_PROVIDER_SITE_OTHER): Payer: Medicare Other | Admitting: Internal Medicine

## 2011-01-15 VITALS — BP 140/72 | HR 75 | Temp 97.8°F | Resp 16 | Wt 148.0 lb

## 2011-01-15 DIAGNOSIS — R10813 Right lower quadrant abdominal tenderness: Secondary | ICD-10-CM

## 2011-01-15 DIAGNOSIS — N259 Disorder resulting from impaired renal tubular function, unspecified: Secondary | ICD-10-CM

## 2011-01-15 DIAGNOSIS — R300 Dysuria: Secondary | ICD-10-CM

## 2011-01-15 DIAGNOSIS — E119 Type 2 diabetes mellitus without complications: Secondary | ICD-10-CM

## 2011-01-15 DIAGNOSIS — N3090 Cystitis, unspecified without hematuria: Secondary | ICD-10-CM

## 2011-01-15 LAB — URINALYSIS, ROUTINE W REFLEX MICROSCOPIC
Bilirubin Urine: NEGATIVE
Ketones, ur: NEGATIVE
Total Protein, Urine: NEGATIVE
pH: 7 (ref 5.0–8.0)

## 2011-01-15 NOTE — Patient Instructions (Signed)
Abdominal Pain Abdominal pain can be caused by many things. Your caregiver decides the seriousness of your pain by an examination and possibly blood tests and X-rays. Many cases can be observed and treated at home. Most abdominal pain is not caused by a disease and will probably improve without treatment. However, in many cases, more time must pass before a clear cause of the pain can be found. Before that point, it may not be known if you need more testing, or if hospitalization or surgery is needed. HOME CARE INSTRUCTIONS  Do not take laxatives unless directed by your caregiver.   Take pain medicine only as directed by your caregiver.   Only take over-the-counter or prescription medicines for pain, discomfort, or fever as directed by your caregiver.   Try a clear liquid diet (broth, tea, or water) for 100.5 or as ordered by your caregiver. Slowly move to a bland diet as tolerated.  SEEK IMMEDIATE MEDICAL CARE IF:  The pain does not go away.   You or your child has an oral temperature above 100.5, not controlled by medicine.   You keep throwing up (vomiting).   The pain is felt only in portions of the abdomen. Pain in the right side could possibly be appendicitis. In an adult, pain in the left lower portion of the abdomen could be colitis or diverticulitis.   You pass bloody or black tarry stools.  MAKE SURE YOU:  Understand these instructions.   Will watch your condition.   Will get help right away if you are not doing well or get worse.  Document Released: 02/04/2005 Document Re-Released: 07/22/2009 [facility] Patient Information 2011 [place].

## 2011-01-15 NOTE — Assessment & Plan Note (Signed)
UA and urine clx today

## 2011-01-15 NOTE — Assessment & Plan Note (Signed)
I am concerned she may have a mass or inflammation in the RLQ so I have ordered a CT scan today

## 2011-01-15 NOTE — Assessment & Plan Note (Signed)
Recent A1C shows good control of blood sugar

## 2011-01-15 NOTE — Assessment & Plan Note (Signed)
Overtime her renal function has been stable

## 2011-01-15 NOTE — Progress Notes (Signed)
Subjective:    Patient ID: Brandy Gonzales, female    DOB: 1940-08-15, 70 y.o.   MRN: [id number]  Abdominal Pain This is a new problem. The current episode started 1 to 4 weeks ago. The onset quality is gradual. The problem occurs intermittently. The most recent episode lasted 10 days. The problem has been unchanged. The pain is located in the RLQ. The pain is at a severity of 2/10. The pain is mild. The quality of the pain is burning and aching. The abdominal pain does not radiate. Associated symptoms include constipation and dysuria. Pertinent negatives include no anorexia, arthralgias, belching, diarrhea, fever, flatus, frequency, headaches, hematochezia, hematuria, melena, myalgias, nausea, vomiting or weight loss. The pain is aggravated by certain positions. The pain is relieved by nothing. She has tried H2 blockers for the symptoms. The treatment provided no relief. Prior workup: she went to an [facility] and labs were ok, plain film showed some stool.      Review of Systems  Constitutional: Negative for fever, chills, weight loss, diaphoresis, activity change, appetite change, fatigue and unexpected weight change.  HENT: Negative for sore throat, facial swelling, trouble swallowing, neck pain, neck stiffness and voice change.   Respiratory: Negative for apnea, cough, choking, chest tightness, shortness of breath, wheezing and stridor.   Cardiovascular: Negative for chest pain, palpitations and leg swelling.  Gastrointestinal: Positive for abdominal pain and constipation. Negative for nausea, vomiting, diarrhea, blood in stool, melena, hematochezia, abdominal distention, anal bleeding, rectal pain, anorexia and flatus.  Genitourinary: Positive for dysuria. Negative for urgency, frequency, hematuria, flank pain, decreased urine volume, vaginal bleeding, vaginal discharge, enuresis, difficulty urinating, genital sores, vaginal pain, menstrual problem, pelvic pain and dyspareunia.  Musculoskeletal:  Negative for myalgias, back pain, joint swelling, arthralgias and gait problem.  Skin: Negative for color change, pallor, rash and wound.  Neurological: Negative for dizziness, tremors, seizures, syncope, facial asymmetry, speech difficulty, weakness, light-headedness, numbness and headaches.  Hematological: Negative for adenopathy. Does not bruise/bleed easily.  Psychiatric/Behavioral: Negative.        Objective:   Physical Exam  Vitals reviewed. Constitutional: She is oriented to person, place, and time. She appears well-developed and well-nourished. No distress.  HENT:  Head: Normocephalic and atraumatic.  Mouth/Throat: Oropharynx is clear and moist. No oropharyngeal exudate.  Eyes: Conjunctivae are normal. Right eye exhibits no discharge. Left eye exhibits no discharge. No scleral icterus.  Neck: Normal range of motion. Neck supple. No JVD present. No tracheal deviation present. No thyromegaly present.  Cardiovascular: Normal rate, regular rhythm, normal heart sounds and intact distal pulses.  Exam reveals no gallop and no friction rub.   No murmur heard. Pulmonary/Chest: Effort normal and breath sounds normal. No stridor. No respiratory distress. She has no wheezes. She has no rales. She exhibits no tenderness.  Abdominal: Soft. Normal appearance and bowel sounds are normal. She exhibits no distension, no ascites, no pulsatile midline mass and no mass. There is no hepatosplenomegaly, splenomegaly or hepatomegaly. There is tenderness in the right lower quadrant. There is no rebound, no guarding and no CVA tenderness. No hernia. Hernia confirmed negative in the ventral area, confirmed negative in the right inguinal area and confirmed negative in the left inguinal area.  Genitourinary: Rectal exam shows external hemorrhoid and internal hemorrhoid. Rectal exam shows no fissure, no mass, no tenderness and anal tone normal. Guaiac negative stool. Right adnexum displays no mass, no tenderness and  no fullness. Left adnexum displays no mass, no tenderness and no fullness.  Musculoskeletal:  Normal range of motion. She exhibits no edema and no tenderness.  Lymphadenopathy:    She has no cervical adenopathy.  Neurological: She is alert and oriented to person, place, and time. She has normal reflexes. She displays normal reflexes. No cranial nerve deficit. She exhibits normal muscle tone. Coordination normal.  Skin: Skin is warm and dry. No rash noted. She is not diaphoretic. No erythema. No pallor.  Psychiatric: She has a normal mood and affect. Her behavior is normal. Judgment and thought content normal.      Lab Results  Component Value Date   WBC 7.9 01/10/2011   HGB 13.6 01/10/2011   HCT 40.0 01/10/2011   PLT 178 01/10/2011   CHOL 192 03/26/2010   TRIG 171.0* 03/26/2010   HDL 41.90 03/26/2010   LDLDIRECT 78.9 10/22/2008   ALT 28 03/26/2010   AST 24 03/26/2010   NA 143 01/10/2011   K 4.3 01/10/2011   CL 111 01/10/2011   CREATININE 1.70* 01/10/2011   BUN 23 01/10/2011   CO2 26 12/25/2010   TSH 1.68 03/26/2010   INR 1.0 RATIO 04/15/2006   HGBA1C 5.9 12/25/2010   MICROALBUR 0.6 12/25/2010     Assessment & Plan:

## 2011-01-15 NOTE — Assessment & Plan Note (Signed)
Will check a UA and urine clx today

## 2011-01-17 LAB — CULTURE, URINE COMPREHENSIVE: Organism ID, Bacteria: NO GROWTH

## 2011-01-22 ENCOUNTER — Ambulatory Visit (INDEPENDENT_AMBULATORY_CARE_PROVIDER_SITE_OTHER)
Admission: RE | Admit: 2011-01-22 | Discharge: 2011-01-22 | Disposition: A | Discharge status: Home / Self Care | Payer: Medicare Other | Source: Ambulatory Visit | Attending: Internal Medicine | Admitting: Internal Medicine

## 2011-01-22 DIAGNOSIS — R10813 Right lower quadrant abdominal tenderness: Secondary | ICD-10-CM

## 2011-01-22 DIAGNOSIS — N259 Disorder resulting from impaired renal tubular function, unspecified: Secondary | ICD-10-CM

## 2011-01-22 DIAGNOSIS — E119 Type 2 diabetes mellitus without complications: Secondary | ICD-10-CM

## 2011-01-22 MED ORDER — IOHEXOL 300 MG/ML  SOLN
80.0000 mL | Freq: Once | INTRAMUSCULAR | Status: AC | PRN
  Administered 2011-01-22: 80 mL via INTRAVENOUS

## 2011-04-28 ENCOUNTER — Ambulatory Visit (INDEPENDENT_AMBULATORY_CARE_PROVIDER_SITE_OTHER): Payer: Medicare Other | Admitting: Internal Medicine

## 2011-04-28 ENCOUNTER — Encounter: Payer: Self-pay | Admitting: Internal Medicine

## 2011-04-28 ENCOUNTER — Other Ambulatory Visit (INDEPENDENT_AMBULATORY_CARE_PROVIDER_SITE_OTHER): Payer: Medicare Other

## 2011-04-28 VITALS — BP 142/66 | HR 76 | Temp 98.5°F | Resp 16 | Wt 148.0 lb

## 2011-04-28 DIAGNOSIS — I10 Essential (primary) hypertension: Secondary | ICD-10-CM

## 2011-04-28 DIAGNOSIS — N259 Disorder resulting from impaired renal tubular function, unspecified: Secondary | ICD-10-CM

## 2011-04-28 DIAGNOSIS — E119 Type 2 diabetes mellitus without complications: Secondary | ICD-10-CM

## 2011-04-28 DIAGNOSIS — R10813 Right lower quadrant abdominal tenderness: Secondary | ICD-10-CM

## 2011-04-28 LAB — BASIC METABOLIC PANEL
Calcium: 9.8 mg/dL (ref 8.4–10.5)
Creatinine, Ser: 1.5 mg/dL — ABNORMAL HIGH (ref 0.4–1.2)
GFR: 43.65 mL/min — ABNORMAL LOW (ref >60.00)

## 2011-04-28 LAB — HEMOGLOBIN A1C: Hgb A1c MFr Bld: 5.7 % (ref 4.6–6.5)

## 2011-04-28 NOTE — Assessment & Plan Note (Signed)
9/12 CT was ok Resolved

## 2011-04-28 NOTE — Assessment & Plan Note (Signed)
BP Readings from Last 3 Encounters:  04/28/11 142/66  01/15/11 140/72  12/25/10 128/72  Continue with current prescription therapy as reflected on the Med list.

## 2011-04-28 NOTE — Progress Notes (Signed)
  Subjective:    Patient ID: Brandy Gonzales, female    DOB: 1941/01/07, 70 y.o.   MRN: [id number]  HPI The patient presents for a follow-up of  chronic hypertension, chronic dyslipidemia, type 2 diabetes controlled with medicines      Review of Systems  Constitutional: Negative for chills, activity change, appetite change, fatigue and unexpected weight change.  HENT: Negative for congestion, mouth sores and sinus pressure.   Eyes: Negative for visual disturbance.  Respiratory: Negative for cough and chest tightness.   Gastrointestinal: Negative for nausea and abdominal pain.  Genitourinary: Negative for frequency, difficulty urinating and vaginal pain.  Musculoskeletal: Negative for back pain and gait problem.  Skin: Negative for pallor and rash.  Neurological: Negative for dizziness, tremors, weakness, numbness and headaches.  Psychiatric/Behavioral: Negative for confusion and sleep disturbance.       Objective:   Physical Exam  Constitutional: She appears well-developed and well-nourished. No distress.  HENT:  Head: Normocephalic.  Right Ear: External ear normal.  Left Ear: External ear normal.  Nose: Nose normal.  Mouth/Throat: Oropharynx is clear and moist.  Eyes: Conjunctivae are normal. Pupils are equal, round, and reactive to light. Right eye exhibits no discharge. Left eye exhibits no discharge.  Neck: Normal range of motion. Neck supple. No JVD present. No tracheal deviation present. No thyromegaly present.  Cardiovascular: Normal rate, regular rhythm and normal heart sounds.   Pulmonary/Chest: No stridor. No respiratory distress. She has no wheezes.  Abdominal: Soft. Bowel sounds are normal. She exhibits no distension and no mass. There is no tenderness. There is no rebound and no guarding.  Musculoskeletal: She exhibits no edema and no tenderness.  Lymphadenopathy:    She has no cervical adenopathy.  Neurological: She displays normal reflexes. No cranial nerve  deficit. She exhibits normal muscle tone. Coordination normal.  Skin: No rash noted. No erythema.  Psychiatric: She has a normal mood and affect. Her behavior is normal. Judgment and thought content normal.          Assessment & Plan:

## 2011-04-28 NOTE — Assessment & Plan Note (Signed)
Continue with current prescription therapy as reflected on the Med list.  

## 2011-04-30 ENCOUNTER — Telehealth: Payer: Self-pay | Admitting: Internal Medicine

## 2011-04-30 NOTE — Telephone Encounter (Signed)
Stacey, please, inform patient that all labs are OK Thx   

## 2011-05-01 NOTE — Telephone Encounter (Signed)
Pt informed

## 2011-08-16 ENCOUNTER — Emergency Department (HOSPITAL_COMMUNITY)
Admission: EM | Admit: 2011-08-16 | Discharge: 2011-08-16 | Disposition: A | Discharge status: Home / Self Care | Payer: Medicare Other | Attending: Emergency Medicine | Admitting: Emergency Medicine

## 2011-08-16 ENCOUNTER — Emergency Department (HOSPITAL_COMMUNITY): Payer: Medicare Other

## 2011-08-16 ENCOUNTER — Encounter (HOSPITAL_COMMUNITY): Payer: Self-pay | Admitting: Emergency Medicine

## 2011-08-16 DIAGNOSIS — R0602 Shortness of breath: Secondary | ICD-10-CM | POA: Diagnosis not present

## 2011-08-16 DIAGNOSIS — K219 Gastro-esophageal reflux disease without esophagitis: Secondary | ICD-10-CM | POA: Insufficient documentation

## 2011-08-16 DIAGNOSIS — R0789 Other chest pain: Secondary | ICD-10-CM | POA: Insufficient documentation

## 2011-08-16 DIAGNOSIS — M545 Low back pain, unspecified: Secondary | ICD-10-CM | POA: Diagnosis not present

## 2011-08-16 DIAGNOSIS — E119 Type 2 diabetes mellitus without complications: Secondary | ICD-10-CM | POA: Insufficient documentation

## 2011-08-16 DIAGNOSIS — J189 Pneumonia, unspecified organism: Secondary | ICD-10-CM | POA: Insufficient documentation

## 2011-08-16 DIAGNOSIS — R209 Unspecified disturbances of skin sensation: Secondary | ICD-10-CM | POA: Diagnosis not present

## 2011-08-16 DIAGNOSIS — G8929 Other chronic pain: Secondary | ICD-10-CM | POA: Diagnosis not present

## 2011-08-16 DIAGNOSIS — R110 Nausea: Secondary | ICD-10-CM | POA: Diagnosis not present

## 2011-08-16 DIAGNOSIS — I10 Essential (primary) hypertension: Secondary | ICD-10-CM | POA: Diagnosis not present

## 2011-08-16 DIAGNOSIS — R079 Chest pain, unspecified: Secondary | ICD-10-CM | POA: Diagnosis not present

## 2011-08-16 DIAGNOSIS — M549 Dorsalgia, unspecified: Secondary | ICD-10-CM | POA: Insufficient documentation

## 2011-08-16 LAB — URINALYSIS, ROUTINE W REFLEX MICROSCOPIC
Bilirubin Urine: NEGATIVE
Ketones, ur: NEGATIVE mg/dL
Nitrite: NEGATIVE
Specific Gravity, Urine: 1.014 (ref 1.005–1.030)
Urobilinogen, UA: 0.2 mg/dL (ref 0.0–1.0)

## 2011-08-16 LAB — CBC
Hemoglobin: 12.9 g/dL (ref 12.0–15.0)
MCHC: 34.6 g/dL (ref 30.0–36.0)
RBC: 4.26 MIL/uL (ref 3.87–5.11)

## 2011-08-16 LAB — PROTIME-INR
INR: 1.03 (ref 0.00–1.49)
Prothrombin Time: 13.7 seconds (ref 11.6–15.2)

## 2011-08-16 LAB — DIFFERENTIAL
Basophils Absolute: 0 10*3/uL (ref 0.0–0.1)
Eosinophils Relative: 1 % (ref 0–5)
Lymphs Abs: 1.5 10*3/uL (ref 0.7–4.0)
Neutro Abs: 10.4 10*3/uL — ABNORMAL HIGH (ref 1.7–7.7)

## 2011-08-16 LAB — LIPASE, BLOOD: Lipase: 22 U/L (ref 11–59)

## 2011-08-16 LAB — URINE MICROSCOPIC-ADD ON

## 2011-08-16 LAB — BASIC METABOLIC PANEL
GFR calc Af Amer: 52 mL/min — ABNORMAL LOW (ref >90)
GFR calc non Af Amer: 45 mL/min — ABNORMAL LOW (ref >90)
Potassium: 4.7 mEq/L (ref 3.5–5.1)
Sodium: 137 mEq/L (ref 135–145)

## 2011-08-16 MED ORDER — GI COCKTAIL ~~LOC~~
30.0000 mL | Freq: Once | ORAL | Status: AC
  Administered 2011-08-16: 30 mL via ORAL
  Filled 2011-08-16: qty 30

## 2011-08-16 MED ORDER — PANTOPRAZOLE SODIUM 40 MG IV SOLR
40.0000 mg | Freq: Once | INTRAVENOUS | Status: AC
  Administered 2011-08-16: 40 mg via INTRAVENOUS
  Filled 2011-08-16: qty 40

## 2011-08-16 MED ORDER — MOXIFLOXACIN HCL 400 MG PO TABS: 400.0000 mg | ORAL_TABLET | Freq: Every day | ORAL | Status: AC

## 2011-08-16 MED ORDER — MOXIFLOXACIN HCL 400 MG PO TABS
400.0000 mg | ORAL_TABLET | Freq: Once | ORAL | Status: AC
  Administered 2011-08-16: 400 mg via ORAL
  Filled 2011-08-16: qty 1

## 2011-08-16 MED ORDER — OMEPRAZOLE 20 MG PO CPDR: 20.0000 mg | DELAYED_RELEASE_CAPSULE | Freq: Every day | ORAL | Status: DC

## 2011-08-16 NOTE — Discharge Instructions (Signed)
Chest Pain (Nonspecific) There is no evidence of heart attack or blood clot in lung. Take the stomach medicine as prescribed and followup with her doctor on the 18th. Return to the ED if you develop new or worsening symptoms. It is often hard to give a specific diagnosis for the cause of chest pain. There is always a chance that your pain could be related to something serious, such as a heart attack or a blood clot in the lungs. You need to follow up with your caregiver for further evaluation. CAUSES   Heartburn.   Pneumonia or bronchitis.   Anxiety or stress.   Inflammation around your heart (pericarditis) or lung (pleuritis or pleurisy).   A blood clot in the lung.   A collapsed lung (pneumothorax). It can develop suddenly on its own (spontaneous pneumothorax) or from injury (trauma) to the chest.   Shingles infection (herpes zoster virus).  The chest wall is composed of bones, muscles, and cartilage. Any of these can be the source of the pain.  The bones can be bruised by injury.   The muscles or cartilage can be strained by coughing or overwork.   The cartilage can be affected by inflammation and become sore (costochondritis).  DIAGNOSIS  Lab tests or other studies, such as X-rays, electrocardiography, stress testing, or cardiac imaging, may be needed to find the cause of your pain.  TREATMENT   Treatment depends on what may be causing your chest pain. Treatment may include:   Acid blockers for heartburn.   Anti-inflammatory medicine.   Pain medicine for inflammatory conditions.   Antibiotics if an infection is present.   You may be advised to change lifestyle habits. This includes stopping smoking and avoiding alcohol, caffeine, and chocolate.   You may be advised to keep your head raised (elevated) when sleeping. This reduces the chance of acid going backward from your stomach into your esophagus.   Most of the time, nonspecific chest pain will improve within 2 to 3  days with rest and mild pain medicine.  HOME CARE INSTRUCTIONS   If antibiotics were prescribed, take your antibiotics as directed. Finish them even if you start to feel better.   For the next few days, avoid physical activities that bring on chest pain. Continue physical activities as directed.   Do not smoke.   Avoid drinking alcohol.   Only take over-the-counter or prescription medicine for pain, discomfort, or fever as directed by your caregiver.   Follow your caregiver's suggestions for further testing if your chest pain does not go away.   Keep any follow-up appointments you made. If you do not go to an appointment, you could develop lasting (chronic) problems with pain. If there is any problem keeping an appointment, you must call to reschedule.  SEEK MEDICAL CARE IF:   You think you are having problems from the medicine you are taking. Read your medicine instructions carefully.   Your chest pain does not go away, even after treatment.   You develop a rash with blisters on your chest.  SEEK IMMEDIATE MEDICAL CARE IF:   You have increased chest pain or pain that spreads to your arm, neck, jaw, back, or abdomen.   You develop shortness of breath, an increasing cough, or you are coughing up blood.   You have severe back or abdominal pain, feel nauseous, or vomit.   You develop severe weakness, fainting, or chills.   You have a fever.  THIS IS AN EMERGENCY. Do not wait  to see if the pain will go away. Get medical help at once. Call your local emergency services (911 in [place] Do not drive yourself to the hospital. MAKE SURE YOU:   Understand these instructions.   Will watch your condition.   Will get help right away if you are not doing well or get worse.  Document Released: 02/04/2005 Document Revised: 04/16/2011 Document Reviewed: 12/01/2007 ExitCare Patient Information 2012 ExitCare, LLCPneumonia, Adult Pneumonia is an infection of the lungs.  CAUSES Pneumonia  may be caused by bacteria or a virus. Usually, these infections are caused by breathing infectious particles into the lungs (respiratory tract). SYMPTOMS   Cough.   Fever.   Chest pain.   Increased rate of breathing.   Wheezing.   Mucus production.  DIAGNOSIS  If you have the common symptoms of pneumonia, your caregiver will typically confirm the diagnosis with a chest X-ray. The X-ray will show an abnormality in the lung (pulmonary infiltrate) if you have pneumonia. Other tests of your blood, urine, or sputum may be done to find the specific cause of your pneumonia. Your caregiver may also do tests (blood gases or pulse oximetry) to see how well your lungs are working. TREATMENT  Some forms of pneumonia may be spread to other people when you cough or sneeze. You may be asked to wear a mask before and during your exam. Pneumonia that is caused by bacteria is treated with antibiotic medicine. Pneumonia that is caused by the influenza virus may be treated with an antiviral medicine. Most other viral infections must run their course. These infections will not respond to antibiotics.  PREVENTION A pneumococcal shot (vaccine) is available to prevent a common bacterial cause of pneumonia. This is usually suggested for:  People over 34 years old.   Patients on chemotherapy.   People with chronic lung problems, such as bronchitis or emphysema.   People with immune system problems.  If you are over 65 or have a high risk condition, you may receive the pneumococcal vaccine if you have not received it before. In some countries, a routine influenza vaccine is also recommended. This vaccine can help prevent some cases of pneumonia.You may be offered the influenza vaccine as part of your care. If you smoke, it is time to quit. You may receive instructions on how to stop smoking. Your caregiver can provide medicines and counseling to help you quit. HOME CARE INSTRUCTIONS   Cough suppressants may be  used if you are losing too much rest. However, coughing protects you by clearing your lungs. You should avoid using cough suppressants if you can.   Your caregiver may have prescribed medicine if he or she thinks your pneumonia is caused by a bacteria or influenza. Finish your medicine even if you start to feel better.   Your caregiver may also prescribe an expectorant. This loosens the mucus to be coughed up.   Only take over-the-counter or prescription medicines for pain, discomfort, or fever as directed by your caregiver.   Do not smoke. Smoking is a common cause of bronchitis and can contribute to pneumonia. If you are a smoker and continue to smoke, your cough may last several weeks after your pneumonia has cleared.   A cold steam vaporizer or humidifier in your room or home may help loosen mucus.   Coughing is often worse at night. Sleeping in a semi-upright position in a recliner or using a couple pillows under your head will help with this.   Get rest  as you feel it is needed. Your body will usually let you know when you need to rest.  SEEK IMMEDIATE MEDICAL CARE IF:   Your illness becomes worse. This is especially true if you are elderly or weakened from any other disease.   You cannot control your cough with suppressants and are losing sleep.   You begin coughing up blood.   You develop pain which is getting worse or is uncontrolled with medicines.   You have a fever.   Any of the symptoms which initially brought you in for treatment are getting worse rather than better.   You develop shortness of breath or chest pain.  MAKE SURE YOU:   Understand these instructions.   Will watch your condition.   Will get help right away if you are not doing well or get worse.  Document Released: 04/27/2005 Document Revised: 04/16/2011 Document Reviewed: 07/17/2010 [facility] Patient Information 2012 [place], [place].Marland Kitchen
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
## 2011-08-16 NOTE — ED Notes (Signed)
Pt states understanding of discharge instructions 

## 2011-08-16 NOTE — ED Notes (Signed)
C/o burning to R side of chest and sob since yesterday.  Reports mild nausea right before she left home that has resolved.

## 2011-08-27 ENCOUNTER — Ambulatory Visit: Payer: Medicare Other | Admitting: Internal Medicine

## 2011-09-10 ENCOUNTER — Other Ambulatory Visit (INDEPENDENT_AMBULATORY_CARE_PROVIDER_SITE_OTHER): Payer: Medicare Other

## 2011-09-10 ENCOUNTER — Ambulatory Visit (INDEPENDENT_AMBULATORY_CARE_PROVIDER_SITE_OTHER): Payer: Medicare Other | Admitting: Internal Medicine

## 2011-09-10 ENCOUNTER — Encounter: Payer: Self-pay | Admitting: Internal Medicine

## 2011-09-10 DIAGNOSIS — E119 Type 2 diabetes mellitus without complications: Secondary | ICD-10-CM | POA: Diagnosis not present

## 2011-09-10 DIAGNOSIS — M545 Low back pain, unspecified: Secondary | ICD-10-CM

## 2011-09-10 DIAGNOSIS — I10 Essential (primary) hypertension: Secondary | ICD-10-CM | POA: Diagnosis not present

## 2011-09-10 DIAGNOSIS — R079 Chest pain, unspecified: Secondary | ICD-10-CM | POA: Diagnosis not present

## 2011-09-10 DIAGNOSIS — N259 Disorder resulting from impaired renal tubular function, unspecified: Secondary | ICD-10-CM

## 2011-09-10 DIAGNOSIS — R10813 Right lower quadrant abdominal tenderness: Secondary | ICD-10-CM | POA: Diagnosis not present

## 2011-09-10 DIAGNOSIS — H409 Unspecified glaucoma: Secondary | ICD-10-CM | POA: Insufficient documentation

## 2011-09-10 HISTORY — DX: Chest pain, unspecified: R07.9

## 2011-09-10 LAB — URINALYSIS, ROUTINE W REFLEX MICROSCOPIC
Nitrite: NEGATIVE
Specific Gravity, Urine: 1.01 (ref 1.000–1.030)
Total Protein, Urine: NEGATIVE
Urine Glucose: NEGATIVE
Urobilinogen, UA: 0.2 (ref 0.0–1.0)

## 2011-09-10 LAB — BASIC METABOLIC PANEL
CO2: 28 mEq/L (ref 19–32)
Glucose, Bld: 119 mg/dL — ABNORMAL HIGH (ref 70–99)
Potassium: 4.7 mEq/L (ref 3.5–5.1)
Sodium: 144 mEq/L (ref 135–145)

## 2011-09-10 MED ORDER — OMEPRAZOLE 20 MG PO CPDR: 20.0000 mg | DELAYED_RELEASE_CAPSULE | Freq: Every day | ORAL | Status: DC

## 2011-09-10 MED ORDER — CARVEDILOL 25 MG PO TABS: 25.0000 mg | ORAL_TABLET | Freq: Two times a day (BID) | ORAL | Status: DC

## 2011-09-10 MED ORDER — GLIMEPIRIDE 2 MG PO TABS: 2.0000 mg | ORAL_TABLET | Freq: Every day | ORAL | Status: DC

## 2011-09-10 MED ORDER — SPIRONOLACTONE 50 MG PO TABS: 50.0000 mg | ORAL_TABLET | Freq: Every day | ORAL | Status: DC

## 2011-09-10 NOTE — Assessment & Plan Note (Signed)
Continue with current prescription therapy as reflected on the Med list.  

## 2011-09-10 NOTE — Assessment & Plan Note (Signed)
New 2013 Dr Page Spiro f/u

## 2011-09-10 NOTE — Progress Notes (Signed)
Patient ID: Brandy Gonzales, female   DOB: 1940-06-14, 71 y.o.   MRN: [id number]  Subjective:    Patient ID: Brandy Gonzales, female    DOB: 1940-05-29, 71 y.o.   MRN: [id number]  HPI The patient presents for a follow-up of  chronic hypertension, chronic dyslipidemia, type 2 diabetes controlled with medicines. She had a R sided chest burning and went to ER on 08/16/11 - ? Shingles vs PNA      Review of Systems  Constitutional: Negative for chills, activity change, appetite change, fatigue and unexpected weight change.  HENT: Negative for congestion, mouth sores and sinus pressure.   Eyes: Negative for visual disturbance.  Respiratory: Negative for cough, chest tightness and wheezing.   Cardiovascular: Negative for chest pain and leg swelling.  Gastrointestinal: Negative for nausea and abdominal pain.  Genitourinary: Negative for frequency, difficulty urinating and vaginal pain.  Musculoskeletal: Negative for back pain and gait problem.  Skin: Negative for pallor and rash.  Neurological: Negative for dizziness, tremors, weakness, numbness and headaches.  Psychiatric/Behavioral: Negative for suicidal ideas, confusion and sleep disturbance.       Objective:   Physical Exam  Constitutional: She appears well-developed and well-nourished. No distress.  HENT:  Head: Normocephalic.  Right Ear: External ear normal.  Left Ear: External ear normal.  Nose: Nose normal.  Mouth/Throat: Oropharynx is clear and moist.  Eyes: Conjunctivae are normal. Pupils are equal, round, and reactive to light. Right eye exhibits no discharge. Left eye exhibits no discharge.  Neck: Normal range of motion. Neck supple. No JVD present. No tracheal deviation present. No thyromegaly present.  Cardiovascular: Normal rate, regular rhythm and normal heart sounds.   Pulmonary/Chest: No stridor. No respiratory distress. She has no wheezes.  Abdominal: Soft. Bowel sounds are normal. She exhibits no distension and no mass. There  is no tenderness. There is no rebound and no guarding.  Musculoskeletal: She exhibits no edema and no tenderness.  Lymphadenopathy:    She has no cervical adenopathy.  Neurological: She displays normal reflexes. No cranial nerve deficit. She exhibits normal muscle tone. Coordination normal.  Skin: No rash noted. No erythema.  Psychiatric: She has a normal mood and affect. Her behavior is normal. Judgment and thought content normal.    Lab Results  Component Value Date   WBC 12.5* 08/16/2011   HGB 12.9 08/16/2011   HCT 37.3 08/16/2011   PLT 170 08/16/2011   GLUCOSE 261* 08/16/2011   CHOL 192 03/26/2010   TRIG 171.0* 03/26/2010   HDL 41.90 03/26/2010   LDLDIRECT 78.9 10/22/2008   LDLCALC 116* 03/26/2010   ALT 28 03/26/2010   AST 24 03/26/2010   NA 137 08/16/2011   K 4.7 08/16/2011   CL 103 08/16/2011   CREATININE 1.18* 08/16/2011   BUN 30* 08/16/2011   CO2 24 08/16/2011   TSH 1.68 03/26/2010   INR 1.03 08/16/2011   HGBA1C 5.7 04/28/2011   MICROALBUR 0.6 12/25/2010       Assessment & Plan:

## 2011-09-10 NOTE — Assessment & Plan Note (Signed)
4/13 ? PNA R *RADIOLOGY REPORT*  Clinical Data: Chest pain, right-sided back pain, nausea and  shortness of breath.  CHEST - 2 VIEW  Comparison: Chest radiograph performed 02/23/2005  Findings: The lungs are well-aerated. There is question of minimal  right basilar opacity, which could reflect minimal pneumonia.  There is no evidence of pleural effusion or pneumothorax.  The heart is normal in size; the mediastinal contour is within  normal limits. No acute osseous abnormalities are seen. An IVC  filter is noted.  IMPRESSION:  Question of minimal right basilar opacity, which could reflect  minimal pneumonia.  Original Report Authenticated By: Santa Lighter, M.D.  She took Avelox - pain resolved

## 2011-09-14 ENCOUNTER — Telehealth: Payer: Self-pay | Admitting: *Deleted

## 2011-09-14 NOTE — Telephone Encounter (Signed)
busy

## 2011-09-14 NOTE — Telephone Encounter (Signed)
Message copied by Cresenciano Lick on Mon Sep 14, 2011 11:41 AM ------      Message from: Creig Hines      Created: Fri Sep 11, 2011  8:48 AM       Erline Levine, please, inform patient that all labs are OK      Thank you!

## 2011-09-14 NOTE — Telephone Encounter (Signed)
Pt.notified

## 2011-11-04 DIAGNOSIS — E119 Type 2 diabetes mellitus without complications: Secondary | ICD-10-CM | POA: Diagnosis not present

## 2011-12-28 ENCOUNTER — Other Ambulatory Visit: Payer: Self-pay | Admitting: Internal Medicine

## 2012-01-12 ENCOUNTER — Other Ambulatory Visit (INDEPENDENT_AMBULATORY_CARE_PROVIDER_SITE_OTHER): Payer: Medicare Other

## 2012-01-12 DIAGNOSIS — I10 Essential (primary) hypertension: Secondary | ICD-10-CM | POA: Diagnosis not present

## 2012-01-12 DIAGNOSIS — M545 Low back pain: Secondary | ICD-10-CM | POA: Diagnosis not present

## 2012-01-12 DIAGNOSIS — E119 Type 2 diabetes mellitus without complications: Secondary | ICD-10-CM

## 2012-01-12 LAB — BASIC METABOLIC PANEL
BUN: 20 mg/dL (ref 6–23)
Calcium: 10.8 mg/dL — ABNORMAL HIGH (ref 8.4–10.5)
GFR: 47.93 mL/min — ABNORMAL LOW (ref >60.00)
Potassium: 4.5 mEq/L (ref 3.5–5.1)
Sodium: 142 mEq/L (ref 135–145)

## 2012-01-12 LAB — HEMOGLOBIN A1C: Hgb A1c MFr Bld: 5.7 % (ref 4.6–6.5)

## 2012-01-13 ENCOUNTER — Encounter: Payer: Self-pay | Admitting: Internal Medicine

## 2012-01-13 ENCOUNTER — Ambulatory Visit (INDEPENDENT_AMBULATORY_CARE_PROVIDER_SITE_OTHER): Payer: Medicare Other | Admitting: Internal Medicine

## 2012-01-13 VITALS — BP 130/60 | HR 76 | Temp 98.5°F | Resp 16 | Wt 144.0 lb

## 2012-01-13 DIAGNOSIS — N259 Disorder resulting from impaired renal tubular function, unspecified: Secondary | ICD-10-CM

## 2012-01-13 DIAGNOSIS — I10 Essential (primary) hypertension: Secondary | ICD-10-CM | POA: Diagnosis not present

## 2012-01-13 DIAGNOSIS — E119 Type 2 diabetes mellitus without complications: Secondary | ICD-10-CM | POA: Diagnosis not present

## 2012-01-13 DIAGNOSIS — M79609 Pain in unspecified limb: Secondary | ICD-10-CM | POA: Diagnosis not present

## 2012-01-13 DIAGNOSIS — M79604 Pain in right leg: Secondary | ICD-10-CM

## 2012-01-13 DIAGNOSIS — M79605 Pain in left leg: Secondary | ICD-10-CM | POA: Insufficient documentation

## 2012-01-13 HISTORY — DX: Pain in right leg: M79.605

## 2012-01-13 MED ORDER — VITAMIN D 1000 UNITS PO TABS: 1000.0000 [IU] | ORAL_TABLET | Freq: Every day | ORAL | Status: AC

## 2012-01-13 NOTE — Assessment & Plan Note (Signed)
Continue with current prescription therapy as reflected on the Med list.  

## 2012-01-13 NOTE — Assessment & Plan Note (Signed)
She declined injections

## 2012-01-13 NOTE — Assessment & Plan Note (Signed)
Watching labs 

## 2012-01-13 NOTE — Progress Notes (Signed)
   Subjective:    Patient ID: Brandy Gonzales, female    DOB: 1940-06-15, 71 y.o.   MRN: [id number]  HPI The patient presents for a follow-up of  chronic hypertension, chronic dyslipidemia, type 2 diabetes controlled with medicines. She had an episode of B hip pain and her legs gave out the other day. C/o bain behind L thighs L>R  Wt Readings from Last 3 Encounters:  01/13/12 144 lb (65.318 kg)  09/10/11 148 lb (67.132 kg)  04/28/11 148 lb (67.132 kg)     BP Readings from Last 3 Encounters:  01/13/12 130/60  09/10/11 130/80  08/16/11 152/65        Review of Systems  Constitutional: Negative for chills, activity change, appetite change, fatigue and unexpected weight change.  HENT: Negative for congestion, mouth sores and sinus pressure.   Eyes: Negative for visual disturbance.  Respiratory: Negative for cough, chest tightness and wheezing.   Cardiovascular: Negative for chest pain and leg swelling.  Gastrointestinal: Negative for nausea and abdominal pain.  Genitourinary: Negative for frequency, difficulty urinating and vaginal pain.  Musculoskeletal: Negative for back pain and gait problem.  Skin: Negative for pallor and rash.  Neurological: Negative for dizziness, tremors, weakness, numbness and headaches.  Psychiatric/Behavioral: Negative for suicidal ideas, confusion and disturbed wake/sleep cycle.       Objective:   Physical Exam  Constitutional: She appears well-developed and well-nourished. No distress.  HENT:  Head: Normocephalic.  Right Ear: External ear normal.  Left Ear: External ear normal.  Nose: Nose normal.  Mouth/Throat: Oropharynx is clear and moist.  Eyes: Conjunctivae are normal. Pupils are equal, round, and reactive to light. Right eye exhibits no discharge. Left eye exhibits no discharge.  Neck: Normal range of motion. Neck supple. No JVD present. No tracheal deviation present. No thyromegaly present.  Cardiovascular: Normal rate, regular rhythm and  normal heart sounds.   Pulmonary/Chest: No stridor. No respiratory distress. She has no wheezes.  Abdominal: Soft. Bowel sounds are normal. She exhibits no distension and no mass. There is no tenderness. There is no rebound and no guarding.  Musculoskeletal: She exhibits no edema and no tenderness.  Lymphadenopathy:    She has no cervical adenopathy.  Neurological: She displays normal reflexes. No cranial nerve deficit. She exhibits normal muscle tone. Coordination normal.  Skin: No rash noted. No erythema.  Psychiatric: She has a normal mood and affect. Her behavior is normal. Judgment and thought content normal.  LS NT Str leg elev neg B B toch major - tender  Lab Results  Component Value Date   WBC 12.5* 08/16/2011   HGB 12.9 08/16/2011   HCT 37.3 08/16/2011   PLT 170 08/16/2011   GLUCOSE 100* 01/12/2012   CHOL 192 03/26/2010   TRIG 171.0* 03/26/2010   HDL 41.90 03/26/2010   LDLDIRECT 78.9 10/22/2008   LDLCALC 116* 03/26/2010   ALT 28 03/26/2010   AST 24 03/26/2010   NA 142 01/12/2012   K 4.5 01/12/2012   CL 108 01/12/2012   CREATININE 1.4* 01/12/2012   BUN 20 01/12/2012   CO2 27 01/12/2012   TSH 1.68 03/26/2010   INR 1.03 08/16/2011   HGBA1C 5.7 01/12/2012   MICROALBUR 0.6 12/25/2010       Assessment & Plan:

## 2012-05-19 ENCOUNTER — Encounter: Payer: Self-pay | Admitting: Internal Medicine

## 2012-05-19 ENCOUNTER — Ambulatory Visit (INDEPENDENT_AMBULATORY_CARE_PROVIDER_SITE_OTHER): Payer: Medicare Other | Admitting: Internal Medicine

## 2012-05-19 ENCOUNTER — Other Ambulatory Visit (INDEPENDENT_AMBULATORY_CARE_PROVIDER_SITE_OTHER): Payer: Medicare Other

## 2012-05-19 VITALS — BP 134/76 | HR 72 | Temp 98.2°F | Wt 143.0 lb

## 2012-05-19 DIAGNOSIS — E785 Hyperlipidemia, unspecified: Secondary | ICD-10-CM | POA: Diagnosis not present

## 2012-05-19 DIAGNOSIS — Z0000 Encounter for general adult medical examination without abnormal findings: Secondary | ICD-10-CM

## 2012-05-19 DIAGNOSIS — R7309 Other abnormal glucose: Secondary | ICD-10-CM

## 2012-05-19 DIAGNOSIS — M79609 Pain in unspecified limb: Secondary | ICD-10-CM | POA: Diagnosis not present

## 2012-05-19 DIAGNOSIS — M1990 Unspecified osteoarthritis, unspecified site: Secondary | ICD-10-CM

## 2012-05-19 DIAGNOSIS — E119 Type 2 diabetes mellitus without complications: Secondary | ICD-10-CM | POA: Diagnosis not present

## 2012-05-19 DIAGNOSIS — M79605 Pain in left leg: Secondary | ICD-10-CM

## 2012-05-19 DIAGNOSIS — N259 Disorder resulting from impaired renal tubular function, unspecified: Secondary | ICD-10-CM

## 2012-05-19 DIAGNOSIS — I10 Essential (primary) hypertension: Secondary | ICD-10-CM

## 2012-05-19 DIAGNOSIS — R739 Hyperglycemia, unspecified: Secondary | ICD-10-CM

## 2012-05-19 DIAGNOSIS — K219 Gastro-esophageal reflux disease without esophagitis: Secondary | ICD-10-CM

## 2012-05-19 LAB — BASIC METABOLIC PANEL
BUN: 22 mg/dL (ref 6–23)
Calcium: 10.2 mg/dL (ref 8.4–10.5)
Creatinine, Ser: 1.4 mg/dL — ABNORMAL HIGH (ref 0.4–1.2)
GFR: 47.1 mL/min — ABNORMAL LOW (ref >60.00)
Potassium: 4.7 mEq/L (ref 3.5–5.1)

## 2012-05-19 MED ORDER — CARVEDILOL 25 MG PO TABS: 25.0000 mg | ORAL_TABLET | Freq: Two times a day (BID) | ORAL | Status: DC

## 2012-05-19 MED ORDER — SPIRONOLACTONE 50 MG PO TABS: 50.0000 mg | ORAL_TABLET | Freq: Every day | ORAL | Status: DC

## 2012-05-19 MED ORDER — FAMOTIDINE 10 MG PO TABS: 10.0000 mg | ORAL_TABLET | Freq: Every day | ORAL | Status: DC

## 2012-05-19 MED ORDER — GLIMEPIRIDE 2 MG PO TABS: 2.0000 mg | ORAL_TABLET | Freq: Every day | ORAL | Status: DC

## 2012-05-19 MED ORDER — OMEPRAZOLE 20 MG PO CPDR: 20.0000 mg | DELAYED_RELEASE_CAPSULE | Freq: Every day | ORAL | Status: DC

## 2012-05-19 NOTE — Assessment & Plan Note (Signed)
Continue with current prescription therapy as reflected on the Med list.  

## 2012-05-19 NOTE — Assessment & Plan Note (Signed)
Labs

## 2012-05-19 NOTE — Assessment & Plan Note (Signed)
Chronic - diffuse pains

## 2012-05-19 NOTE — Progress Notes (Signed)
   Subjective:    HPI The patient presents for a follow-up of  chronic hypertension, chronic dyslipidemia, type 2 diabetes controlled with medicines. She had an episode of B hip pain and her legs gave out the other day. C/o bain behind L thighs L>R - same C/o R arm pain  Wt Readings from Last 3 Encounters:  05/19/12 143 lb (64.864 kg)  01/13/12 144 lb (65.318 kg)  09/10/11 148 lb (67.132 kg)     BP Readings from Last 3 Encounters:  05/19/12 134/76  01/13/12 130/60  09/10/11 130/80        Review of Systems  Constitutional: Negative for chills, activity change, appetite change, fatigue and unexpected weight change.  HENT: Negative for congestion, mouth sores and sinus pressure.   Eyes: Negative for visual disturbance.  Respiratory: Negative for cough, chest tightness and wheezing.   Cardiovascular: Negative for chest pain and leg swelling.  Gastrointestinal: Negative for nausea and abdominal pain.  Genitourinary: Negative for frequency, difficulty urinating and vaginal pain.  Musculoskeletal: Negative for back pain and gait problem.  Skin: Negative for pallor and rash.  Neurological: Negative for dizziness, tremors, weakness, numbness and headaches.  Psychiatric/Behavioral: Negative for suicidal ideas, confusion and sleep disturbance.       Objective:   Physical Exam  Constitutional: She appears well-developed and well-nourished. No distress.  HENT:  Head: Normocephalic.  Right Ear: External ear normal.  Left Ear: External ear normal.  Nose: Nose normal.  Mouth/Throat: Oropharynx is clear and moist.  Eyes: Conjunctivae normal are normal. Pupils are equal, round, and reactive to light. Right eye exhibits no discharge. Left eye exhibits no discharge.  Neck: Normal range of motion. Neck supple. No JVD present. No tracheal deviation present. No thyromegaly present.  Cardiovascular: Normal rate, regular rhythm and normal heart sounds.   Pulmonary/Chest: No stridor. No  respiratory distress. She has no wheezes.  Abdominal: Soft. Bowel sounds are normal. She exhibits no distension and no mass. There is no tenderness. There is no rebound and no guarding.  Musculoskeletal: She exhibits no edema and no tenderness.  Lymphadenopathy:    She has no cervical adenopathy.  Neurological: She displays normal reflexes. No cranial nerve deficit. She exhibits normal muscle tone. Coordination normal.  Skin: No rash noted. No erythema.  Psychiatric: She has a normal mood and affect. Her behavior is normal. Judgment and thought content normal.  LS NT Str leg elev neg B B toch major - tender  Lab Results  Component Value Date   WBC 12.5* 08/16/2011   HGB 12.9 08/16/2011   HCT 37.3 08/16/2011   PLT 170 08/16/2011   GLUCOSE 100* 01/12/2012   CHOL 192 03/26/2010   TRIG 171.0* 03/26/2010   HDL 41.90 03/26/2010   LDLDIRECT 78.9 10/22/2008   LDLCALC 116* 03/26/2010   ALT 28 03/26/2010   AST 24 03/26/2010   NA 142 01/12/2012   K 4.5 01/12/2012   CL 108 01/12/2012   CREATININE 1.4* 01/12/2012   BUN 20 01/12/2012   CO2 27 01/12/2012   TSH 1.68 03/26/2010   INR 1.03 08/16/2011   HGBA1C 5.7 01/12/2012   MICROALBUR 0.6 12/25/2010       Assessment & Plan:

## 2012-05-25 ENCOUNTER — Telehealth: Payer: Self-pay | Admitting: Internal Medicine

## 2012-05-25 MED ORDER — OMEPRAZOLE 20 MG PO CPDR: 20.0000 mg | DELAYED_RELEASE_CAPSULE | Freq: Every day | ORAL | Status: DC

## 2012-05-25 MED ORDER — CARVEDILOL 25 MG PO TABS: 25.0000 mg | ORAL_TABLET | Freq: Two times a day (BID) | ORAL | Status: DC

## 2012-05-25 MED ORDER — GLIMEPIRIDE 2 MG PO TABS: 2.0000 mg | ORAL_TABLET | Freq: Every day | ORAL | Status: DC

## 2012-05-25 MED ORDER — FAMOTIDINE 10 MG PO TABS: 10.0000 mg | ORAL_TABLET | Freq: Every day | ORAL | Status: DC

## 2012-05-25 NOTE — Telephone Encounter (Signed)
Pt called [facility] to see if her rx's were ready.  The only one they have is the Aldactone.  Several others were refilled on Jan 9, but didn't get to [facility]. Patients number is [phone]

## 2012-05-25 NOTE — Telephone Encounter (Signed)
Done. Pt informed.

## 2012-06-01 DIAGNOSIS — H2510 Age-related nuclear cataract, unspecified eye: Secondary | ICD-10-CM | POA: Diagnosis not present

## 2012-08-08 ENCOUNTER — Emergency Department (INDEPENDENT_AMBULATORY_CARE_PROVIDER_SITE_OTHER)
Admission: EM | Admit: 2012-08-08 | Discharge: 2012-08-08 | Disposition: A | Discharge status: Home / Self Care | Payer: Medicare Other | Source: Home / Self Care | Attending: Family Medicine | Admitting: Family Medicine

## 2012-08-08 ENCOUNTER — Emergency Department (INDEPENDENT_AMBULATORY_CARE_PROVIDER_SITE_OTHER): Payer: Medicare Other

## 2012-08-08 ENCOUNTER — Encounter (HOSPITAL_COMMUNITY): Payer: Self-pay | Admitting: *Deleted

## 2012-08-08 DIAGNOSIS — IMO0002 Reserved for concepts with insufficient information to code with codable children: Secondary | ICD-10-CM | POA: Diagnosis not present

## 2012-08-08 DIAGNOSIS — S4980XA Other specified injuries of shoulder and upper arm, unspecified arm, initial encounter: Secondary | ICD-10-CM | POA: Diagnosis not present

## 2012-08-08 DIAGNOSIS — M79609 Pain in unspecified limb: Secondary | ICD-10-CM | POA: Diagnosis not present

## 2012-08-08 DIAGNOSIS — M25519 Pain in unspecified shoulder: Secondary | ICD-10-CM | POA: Diagnosis not present

## 2012-08-08 DIAGNOSIS — S43401A Unspecified sprain of right shoulder joint, initial encounter: Secondary | ICD-10-CM

## 2012-08-08 MED ORDER — ACETAMINOPHEN 650 MG PO TABS: 1.0000 | ORAL_TABLET | Freq: Three times a day (TID) | ORAL | Status: DC | PRN

## 2012-08-08 MED ORDER — CYCLOBENZAPRINE HCL 10 MG PO TABS: 10.0000 mg | ORAL_TABLET | Freq: Two times a day (BID) | ORAL | Status: DC | PRN
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
## 2012-08-08 NOTE — ED Notes (Signed)
r  Arm     Sling    med
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
## 2012-08-09 NOTE — ED Notes (Signed)
Pain scale  8      r  Ar arm      worsse  On  movement

## 2012-09-19 ENCOUNTER — Other Ambulatory Visit: Payer: Self-pay | Admitting: Internal Medicine

## 2012-09-19 ENCOUNTER — Other Ambulatory Visit (INDEPENDENT_AMBULATORY_CARE_PROVIDER_SITE_OTHER): Payer: Medicare Other

## 2012-09-19 DIAGNOSIS — R739 Hyperglycemia, unspecified: Secondary | ICD-10-CM

## 2012-09-19 DIAGNOSIS — Z0000 Encounter for general adult medical examination without abnormal findings: Secondary | ICD-10-CM | POA: Diagnosis not present

## 2012-09-19 DIAGNOSIS — E785 Hyperlipidemia, unspecified: Secondary | ICD-10-CM | POA: Diagnosis not present

## 2012-09-19 DIAGNOSIS — I10 Essential (primary) hypertension: Secondary | ICD-10-CM | POA: Diagnosis not present

## 2012-09-19 DIAGNOSIS — R7309 Other abnormal glucose: Secondary | ICD-10-CM

## 2012-09-19 LAB — URINALYSIS, ROUTINE W REFLEX MICROSCOPIC
Hgb urine dipstick: NEGATIVE
Nitrite: NEGATIVE
Specific Gravity, Urine: 1.015 (ref 1.000–1.030)
Urobilinogen, UA: 0.2 (ref 0.0–1.0)

## 2012-09-19 LAB — BASIC METABOLIC PANEL
Chloride: 111 mEq/L (ref 96–112)
Creatinine, Ser: 1.4 mg/dL — ABNORMAL HIGH (ref 0.4–1.2)
GFR: 47.84 mL/min — ABNORMAL LOW (ref >60.00)
Potassium: 4.8 mEq/L (ref 3.5–5.1)

## 2012-09-19 LAB — LIPID PANEL
Cholesterol: 144 mg/dL (ref 0–200)
HDL: 47.9 mg/dL (ref >39.00)
LDL Cholesterol: 76 mg/dL (ref 0–99)
Triglycerides: 103 mg/dL (ref 0.0–149.0)
VLDL: 20.6 mg/dL (ref 0.0–40.0)

## 2012-09-19 LAB — CBC WITH DIFFERENTIAL/PLATELET
Basophils Relative: 0.3 % (ref 0.0–3.0)
Eosinophils Relative: 4.2 % (ref 0.0–5.0)
HCT: 38.7 % (ref 36.0–46.0)
MCV: 91.4 fl (ref 78.0–100.0)
Monocytes Absolute: 0.6 10*3/uL (ref 0.1–1.0)
Monocytes Relative: 7.1 % (ref 3.0–12.0)
Neutrophils Relative %: 57.2 % (ref 43.0–77.0)
RBC: 4.24 Mil/uL (ref 3.87–5.11)
WBC: 8.6 10*3/uL (ref 4.5–10.5)

## 2012-09-19 LAB — HEPATIC FUNCTION PANEL
Albumin: 3.8 g/dL (ref 3.5–5.2)
Bilirubin, Direct: 0.2 mg/dL (ref 0.0–0.3)
Total Protein: 7.1 g/dL (ref 6.0–8.3)

## 2012-09-19 LAB — TSH: TSH: 2.21 u[IU]/mL (ref 0.35–5.50)

## 2012-09-19 MED ORDER — CIPROFLOXACIN HCL 250 MG PO TABS: 250.0000 mg | ORAL_TABLET | Freq: Two times a day (BID) | ORAL | Status: DC

## 2012-09-22 ENCOUNTER — Encounter: Payer: Self-pay | Admitting: Internal Medicine

## 2012-09-22 ENCOUNTER — Ambulatory Visit (INDEPENDENT_AMBULATORY_CARE_PROVIDER_SITE_OTHER): Payer: Medicare Other | Admitting: Internal Medicine

## 2012-09-22 ENCOUNTER — Ambulatory Visit: Payer: Medicare Other | Admitting: Internal Medicine

## 2012-09-22 VITALS — BP 158/80 | HR 80 | Temp 98.7°F | Resp 16 | Ht 59.5 in | Wt 149.0 lb

## 2012-09-22 DIAGNOSIS — Z136 Encounter for screening for cardiovascular disorders: Secondary | ICD-10-CM | POA: Diagnosis not present

## 2012-09-22 DIAGNOSIS — N3090 Cystitis, unspecified without hematuria: Secondary | ICD-10-CM | POA: Insufficient documentation

## 2012-09-22 DIAGNOSIS — E119 Type 2 diabetes mellitus without complications: Secondary | ICD-10-CM | POA: Diagnosis not present

## 2012-09-22 DIAGNOSIS — R635 Abnormal weight gain: Secondary | ICD-10-CM | POA: Diagnosis not present

## 2012-09-22 DIAGNOSIS — R011 Cardiac murmur, unspecified: Secondary | ICD-10-CM

## 2012-09-22 DIAGNOSIS — R9431 Abnormal electrocardiogram [ECG] [EKG]: Secondary | ICD-10-CM

## 2012-09-22 DIAGNOSIS — Z0000 Encounter for general adult medical examination without abnormal findings: Secondary | ICD-10-CM

## 2012-09-22 DIAGNOSIS — I10 Essential (primary) hypertension: Secondary | ICD-10-CM

## 2012-09-22 DIAGNOSIS — N259 Disorder resulting from impaired renal tubular function, unspecified: Secondary | ICD-10-CM

## 2012-09-22 HISTORY — DX: Cystitis, unspecified without hematuria: N30.90

## 2012-09-22 MED ORDER — SPIRONOLACTONE-HCTZ 50-50 MG PO TABS: 1.0000 | ORAL_TABLET | Freq: Every day | ORAL | Status: DC

## 2012-09-22 NOTE — Assessment & Plan Note (Signed)
labs

## 2012-09-22 NOTE — Assessment & Plan Note (Signed)
5/14 atypical EKG

## 2012-09-22 NOTE — Assessment & Plan Note (Signed)
Here for medicare wellness/physical  Diet: heart healthy  Physical activity: sedentary  Depression/mood screen: negative  Hearing: intact to whispered voice  Visual acuity: grossly normal, performs annual eye exam  ADLs: capable  Fall risk: none  Home safety: good  Cognitive evaluation: intact to orientation, naming, recall and repetition  EOL planning: adv directives, full code/ I agree  I have personally reviewed and have noted  1. The patient's medical and social history  2. Their use of alcohol, tobacco or illicit drugs  3. Their current medications and supplements  4. The patient's functional ability including ADL's, fall risks, home safety risks and hearing or visual impairment.  5. Diet and physical activities  6. Evidence for depression or mood disorders    Today patient counseled on age appropriate routine health concerns for screening and prevention, each reviewed and up to date or declined. Immunizations reviewed and up to date or declined. Labs ordered and reviewed. Risk factors for depression reviewed and negative. Hearing function and visual acuity are intact. ADLs screened and addressed as needed. Functional ability and level of safety reviewed and appropriate. Education, counseling and referrals performed based on assessed risks today. Patient provided with a copy of personalized plan for preventive services.  Ophth q 12 mo Mammo q 12 mo

## 2012-09-22 NOTE — Assessment & Plan Note (Signed)
Cipro po Rx

## 2012-09-22 NOTE — Assessment & Plan Note (Signed)
Diet discussed Wt Readings from Last 3 Encounters:  09/22/12 149 lb (67.586 kg)  05/19/12 143 lb (64.864 kg)  01/13/12 144 lb (65.318 kg)

## 2012-09-22 NOTE — Progress Notes (Signed)
   Subjective:    HPI The patient presents for a follow-up of  chronic hypertension, chronic dyslipidemia, type 2 diabetes controlled with medicines. She had an episode of B hip pain and her legs gave out the other day C/o ear wax  Wt Readings from Last 3 Encounters:  09/22/12 149 lb (67.586 kg)  05/19/12 143 lb (64.864 kg)  01/13/12 144 lb (65.318 kg)     BP Readings from Last 3 Encounters:  09/22/12 158/80  08/08/12 160/78  05/19/12 134/76        Review of Systems  Constitutional: Negative for chills, activity change, appetite change, fatigue and unexpected weight change.  HENT: Negative for congestion, mouth sores and sinus pressure.   Eyes: Negative for visual disturbance.  Respiratory: Negative for cough, chest tightness and wheezing.   Cardiovascular: Negative for chest pain and leg swelling.  Gastrointestinal: Negative for nausea and abdominal pain.  Genitourinary: Negative for frequency, difficulty urinating and vaginal pain.  Musculoskeletal: Negative for back pain and gait problem.  Skin: Negative for pallor and rash.  Neurological: Negative for dizziness, tremors, weakness, numbness and headaches.  Psychiatric/Behavioral: Negative for suicidal ideas, confusion and sleep disturbance.       Objective:   Physical Exam  Constitutional: She appears well-developed and well-nourished. No distress.  HENT:  Head: Normocephalic.  Right Ear: External ear normal.  Left Ear: External ear normal.  Nose: Nose normal.  Mouth/Throat: Oropharynx is clear and moist.  Eyes: Conjunctivae are normal. Pupils are equal, round, and reactive to light. Right eye exhibits no discharge. Left eye exhibits no discharge.  Neck: Normal range of motion. Neck supple. No JVD present. No tracheal deviation present. No thyromegaly present.  Cardiovascular: Normal rate, regular rhythm and normal heart sounds.   Pulmonary/Chest: No stridor. No respiratory distress. She has no wheezes.   Abdominal: Soft. Bowel sounds are normal. She exhibits no distension and no mass. There is no tenderness. There is no rebound and no guarding.  Musculoskeletal: She exhibits no edema and no tenderness.  Lymphadenopathy:    She has no cervical adenopathy.  Neurological: She displays normal reflexes. No cranial nerve deficit. She exhibits normal muscle tone. Coordination normal.  Skin: No rash noted. No erythema.  Psychiatric: She has a normal mood and affect. Her behavior is normal. Judgment and thought content normal.  LS NT No wax B  Lab Results  Component Value Date   WBC 8.6 09/19/2012   HGB 13.2 09/19/2012   HCT 38.7 09/19/2012   PLT 180.0 09/19/2012   GLUCOSE 110* 09/19/2012   CHOL 144 09/19/2012   TRIG 103.0 09/19/2012   HDL 47.90 09/19/2012   LDLDIRECT 78.9 10/22/2008   LDLCALC 76 09/19/2012   ALT 24 09/19/2012   AST 21 09/19/2012   NA 142 09/19/2012   K 4.8 09/19/2012   CL 111 09/19/2012   CREATININE 1.4* 09/19/2012   BUN 24* 09/19/2012   CO2 24 09/19/2012   TSH 2.21 09/19/2012   INR 1.03 08/16/2011   HGBA1C 6.2 09/19/2012   MICROALBUR 0.6 12/25/2010       Assessment & Plan:

## 2012-09-22 NOTE — Assessment & Plan Note (Signed)
Watching labs 

## 2012-09-22 NOTE — Assessment & Plan Note (Signed)
Change to aldactozide Loose wt NAS diet

## 2012-09-26 ENCOUNTER — Telehealth: Payer: Self-pay | Admitting: Internal Medicine

## 2012-09-26 NOTE — Telephone Encounter (Signed)
Patient left message on triage requesting to be put back on the old medication that was changed at her last appointment, no other information given

## 2012-09-27 NOTE — Telephone Encounter (Signed)
Pt states the Aldactazide is too expensive and she wants to stay on Carvedilol. We will need to call pharmacy and inform them ok to fill Carvedilol. Please advise.

## 2012-09-27 NOTE — Telephone Encounter (Signed)
She was supposed to be on Carvedilol anyway. Aldactozide was given in place of Aldactone. OK to go back on Aldactone Thx

## 2012-09-28 NOTE — Telephone Encounter (Signed)
Pt informed- she states she only wants to take Carvedilol. The Aldactazide was too expensive. She states the pharmacy would not give her the Carvedilol last time she tried to fill it. However, she is going to try to get it filled today. I informed pt she needs both Carvedilol and spironolactone. Pt understands.

## 2012-10-03 ENCOUNTER — Encounter (HOSPITAL_COMMUNITY): Payer: Self-pay | Admitting: *Deleted

## 2012-10-03 ENCOUNTER — Emergency Department (HOSPITAL_COMMUNITY): Payer: Medicare Other

## 2012-10-03 ENCOUNTER — Observation Stay (HOSPITAL_COMMUNITY)
Admission: EM | Admit: 2012-10-03 | Discharge: 2012-10-04 | Disposition: A | Discharge status: Home / Self Care | Payer: Medicare Other | Attending: Internal Medicine | Admitting: Internal Medicine

## 2012-10-03 DIAGNOSIS — R55 Syncope and collapse: Secondary | ICD-10-CM | POA: Diagnosis not present

## 2012-10-03 DIAGNOSIS — K219 Gastro-esophageal reflux disease without esophagitis: Secondary | ICD-10-CM | POA: Diagnosis not present

## 2012-10-03 DIAGNOSIS — Z9181 History of falling: Secondary | ICD-10-CM | POA: Diagnosis not present

## 2012-10-03 DIAGNOSIS — Z79899 Other long term (current) drug therapy: Secondary | ICD-10-CM | POA: Diagnosis not present

## 2012-10-03 DIAGNOSIS — S59919A Unspecified injury of unspecified forearm, initial encounter: Secondary | ICD-10-CM | POA: Diagnosis not present

## 2012-10-03 DIAGNOSIS — R079 Chest pain, unspecified: Secondary | ICD-10-CM | POA: Insufficient documentation

## 2012-10-03 DIAGNOSIS — I10 Essential (primary) hypertension: Secondary | ICD-10-CM

## 2012-10-03 DIAGNOSIS — S6990XA Unspecified injury of unspecified wrist, hand and finger(s), initial encounter: Secondary | ICD-10-CM | POA: Diagnosis not present

## 2012-10-03 DIAGNOSIS — E1121 Type 2 diabetes mellitus with diabetic nephropathy: Secondary | ICD-10-CM | POA: Diagnosis present

## 2012-10-03 DIAGNOSIS — E119 Type 2 diabetes mellitus without complications: Secondary | ICD-10-CM | POA: Insufficient documentation

## 2012-10-03 DIAGNOSIS — R0602 Shortness of breath: Secondary | ICD-10-CM | POA: Diagnosis not present

## 2012-10-03 DIAGNOSIS — I129 Hypertensive chronic kidney disease with stage 1 through stage 4 chronic kidney disease, or unspecified chronic kidney disease: Secondary | ICD-10-CM | POA: Insufficient documentation

## 2012-10-03 DIAGNOSIS — N184 Chronic kidney disease, stage 4 (severe): Secondary | ICD-10-CM | POA: Insufficient documentation

## 2012-10-03 DIAGNOSIS — R9431 Abnormal electrocardiogram [ECG] [EKG]: Secondary | ICD-10-CM | POA: Diagnosis present

## 2012-10-03 DIAGNOSIS — E1165 Type 2 diabetes mellitus with hyperglycemia: Secondary | ICD-10-CM | POA: Diagnosis present

## 2012-10-03 DIAGNOSIS — Z86718 Personal history of other venous thrombosis and embolism: Secondary | ICD-10-CM | POA: Insufficient documentation

## 2012-10-03 DIAGNOSIS — R9430 Abnormal result of cardiovascular function study, unspecified: Secondary | ICD-10-CM | POA: Diagnosis not present

## 2012-10-03 DIAGNOSIS — R69 Illness, unspecified: Secondary | ICD-10-CM | POA: Diagnosis not present

## 2012-10-03 DIAGNOSIS — N289 Disorder of kidney and ureter, unspecified: Secondary | ICD-10-CM | POA: Diagnosis not present

## 2012-10-03 LAB — GLUCOSE, CAPILLARY: Glucose-Capillary: 102 mg/dL — ABNORMAL HIGH (ref 70–99)

## 2012-10-03 LAB — CBC
Hemoglobin: 13 g/dL (ref 12.0–15.0)
Hemoglobin: 12.6 g/dL (ref 12.0–15.0)
MCH: 31.2 pg (ref 26.0–34.0)
MCH: 30.4 pg (ref 26.0–34.0)
MCHC: 34.4 g/dL (ref 30.0–36.0)
MCV: 88.5 fL (ref 78.0–100.0)
MCV: 88.2 fL (ref 78.0–100.0)
Platelets: 159 10*3/uL (ref 150–400)
RBC: 4.17 MIL/uL (ref 3.87–5.11)
RBC: 4.15 MIL/uL (ref 3.87–5.11)
WBC: 7.6 10*3/uL (ref 4.0–10.5)

## 2012-10-03 LAB — COMPREHENSIVE METABOLIC PANEL
AST: 24 U/L (ref 0–37)
Albumin: 3.8 g/dL (ref 3.5–5.2)
BUN: 31 mg/dL — ABNORMAL HIGH (ref 6–23)
Calcium: 10.3 mg/dL (ref 8.4–10.5)
Creatinine, Ser: 1.64 mg/dL — ABNORMAL HIGH (ref 0.50–1.10)

## 2012-10-03 LAB — POCT I-STAT TROPONIN I: Troponin i, poc: 0 ng/mL (ref 0.00–0.08)

## 2012-10-03 LAB — URINALYSIS, ROUTINE W REFLEX MICROSCOPIC
Bilirubin Urine: NEGATIVE
Glucose, UA: NEGATIVE mg/dL
Ketones, ur: NEGATIVE mg/dL
Nitrite: NEGATIVE
Specific Gravity, Urine: 1.015 (ref 1.005–1.030)
pH: 5.5 (ref 5.0–8.0)

## 2012-10-03 LAB — POCT I-STAT, CHEM 8
BUN: 30 mg/dL — ABNORMAL HIGH (ref 6–23)
Calcium, Ion: 1.32 mmol/L — ABNORMAL HIGH (ref 1.13–1.30)
Chloride: 107 mEq/L (ref 96–112)
HCT: 39 % (ref 36.0–46.0)
Potassium: 4.1 mEq/L (ref 3.5–5.1)

## 2012-10-03 LAB — URINE MICROSCOPIC-ADD ON

## 2012-10-03 LAB — CREATININE, SERUM: Creatinine, Ser: 1.67 mg/dL — ABNORMAL HIGH (ref 0.50–1.10)

## 2012-10-03 MED ORDER — ALBUTEROL SULFATE (5 MG/ML) 0.5% IN NEBU: 2.5000 mg | INHALATION_SOLUTION | RESPIRATORY_TRACT | Status: DC | PRN

## 2012-10-03 MED ORDER — ASPIRIN EC 81 MG PO TBEC
81.0000 mg | DELAYED_RELEASE_TABLET | Freq: Every day | ORAL | Status: DC
  Administered 2012-10-04: 81 mg via ORAL
  Filled 2012-10-03: qty 1

## 2012-10-03 MED ORDER — GUAIFENESIN-DM 100-10 MG/5ML PO SYRP: 5.0000 mL | ORAL_SOLUTION | ORAL | Status: DC | PRN

## 2012-10-03 MED ORDER — CARVEDILOL 25 MG PO TABS
25.0000 mg | ORAL_TABLET | Freq: Two times a day (BID) | ORAL | Status: DC
  Administered 2012-10-03 – 2012-10-04 (×2): 25 mg via ORAL
  Filled 2012-10-03 (×4): qty 1

## 2012-10-03 MED ORDER — ONDANSETRON HCL 4 MG/2ML IJ SOLN: 4.0000 mg | Freq: Three times a day (TID) | INTRAMUSCULAR | Status: AC | PRN

## 2012-10-03 MED ORDER — ONDANSETRON HCL 4 MG/2ML IJ SOLN: 4.0000 mg | Freq: Four times a day (QID) | INTRAMUSCULAR | Status: DC | PRN

## 2012-10-03 MED ORDER — PANTOPRAZOLE SODIUM 40 MG PO TBEC
40.0000 mg | DELAYED_RELEASE_TABLET | Freq: Every day | ORAL | Status: DC
  Administered 2012-10-04: 40 mg via ORAL
  Filled 2012-10-03: qty 1

## 2012-10-03 MED ORDER — HEPARIN SODIUM (PORCINE) 5000 UNIT/ML IJ SOLN
5000.0000 [IU] | Freq: Three times a day (TID) | INTRAMUSCULAR | Status: DC
  Administered 2012-10-03 – 2012-10-04 (×3): 5000 [IU] via SUBCUTANEOUS
  Filled 2012-10-03 (×5): qty 1

## 2012-10-03 MED ORDER — GLIMEPIRIDE 1 MG PO TABS
1.0000 mg | ORAL_TABLET | Freq: Every day | ORAL | Status: DC
  Administered 2012-10-04: 1 mg via ORAL
  Filled 2012-10-03 (×2): qty 1

## 2012-10-03 MED ORDER — SODIUM CHLORIDE 0.9 % IV SOLN
INTRAVENOUS | Status: AC
  Administered 2012-10-03: 22:00:00 via INTRAVENOUS

## 2012-10-03 MED ORDER — VITAMIN D3 25 MCG (1000 UNIT) PO TABS
1000.0000 [IU] | ORAL_TABLET | Freq: Every day | ORAL | Status: DC
  Administered 2012-10-04: 1000 [IU] via ORAL
  Filled 2012-10-03: qty 1

## 2012-10-03 MED ORDER — INSULIN ASPART 100 UNIT/ML ~~LOC~~ SOLN: 0.0000 [IU] | Freq: Three times a day (TID) | SUBCUTANEOUS | Status: DC

## 2012-10-03 MED ORDER — POLYETHYLENE GLYCOL 3350 17 G PO PACK
17.0000 g | PACK | Freq: Every day | ORAL | Status: DC | PRN
  Filled 2012-10-03: qty 1

## 2012-10-03 MED ORDER — ASPIRIN 81 MG PO TBEC: 81.0000 mg | DELAYED_RELEASE_TABLET | Freq: Every day | ORAL | Status: DC

## 2012-10-03 MED ORDER — SODIUM CHLORIDE 0.9 % IJ SOLN
3.0000 mL | Freq: Two times a day (BID) | INTRAMUSCULAR | Status: DC
  Administered 2012-10-03: 3 mL via INTRAVENOUS

## 2012-10-03 MED ORDER — ONDANSETRON HCL 4 MG PO TABS: 4.0000 mg | ORAL_TABLET | Freq: Four times a day (QID) | ORAL | Status: DC | PRN

## 2012-10-03 MED ORDER — ASPIRIN 81 MG PO CHEW
324.0000 mg | CHEWABLE_TABLET | Freq: Once | ORAL | Status: AC
  Administered 2012-10-03: 324 mg via ORAL
  Filled 2012-10-03: qty 4
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
## 2012-10-03 NOTE — ED Notes (Signed)
Attempted to call report
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
## 2012-10-03 NOTE — ED Notes (Signed)
MD at bedside. 

## 2012-10-04 ENCOUNTER — Other Ambulatory Visit (HOSPITAL_COMMUNITY): Payer: Medicare Other

## 2012-10-04 ENCOUNTER — Observation Stay (HOSPITAL_COMMUNITY): Payer: Medicare Other

## 2012-10-04 DIAGNOSIS — N289 Disorder of kidney and ureter, unspecified: Secondary | ICD-10-CM

## 2012-10-04 DIAGNOSIS — R079 Chest pain, unspecified: Secondary | ICD-10-CM | POA: Diagnosis not present

## 2012-10-04 DIAGNOSIS — R0789 Other chest pain: Secondary | ICD-10-CM | POA: Diagnosis not present

## 2012-10-04 DIAGNOSIS — E119 Type 2 diabetes mellitus without complications: Secondary | ICD-10-CM

## 2012-10-04 DIAGNOSIS — R55 Syncope and collapse: Secondary | ICD-10-CM

## 2012-10-04 DIAGNOSIS — R9431 Abnormal electrocardiogram [ECG] [EKG]: Secondary | ICD-10-CM

## 2012-10-04 DIAGNOSIS — I10 Essential (primary) hypertension: Secondary | ICD-10-CM | POA: Diagnosis not present

## 2012-10-04 LAB — TROPONIN I: Troponin I: <0.3 ng/mL (ref <0.30)

## 2012-10-04 LAB — BASIC METABOLIC PANEL
BUN: 27 mg/dL — ABNORMAL HIGH (ref 6–23)
CO2: 25 mEq/L (ref 19–32)
Chloride: 108 mEq/L (ref 96–112)
Creatinine, Ser: 1.49 mg/dL — ABNORMAL HIGH (ref 0.50–1.10)
Potassium: 4.4 mEq/L (ref 3.5–5.1)

## 2012-10-04 LAB — CBC
HCT: 34.9 % — ABNORMAL LOW (ref 36.0–46.0)
Hemoglobin: 12.1 g/dL (ref 12.0–15.0)
MCV: 88.8 fL (ref 78.0–100.0)
RDW: 13 % (ref 11.5–15.5)
WBC: 6.7 10*3/uL (ref 4.0–10.5)

## 2012-10-04 LAB — HEMOGLOBIN A1C
Hgb A1c MFr Bld: 6.1 % — ABNORMAL HIGH (ref <5.7)
Mean Plasma Glucose: 128 mg/dL — ABNORMAL HIGH (ref <117)

## 2012-10-04 LAB — GLUCOSE, CAPILLARY
Glucose-Capillary: 108 mg/dL — ABNORMAL HIGH (ref 70–99)
Glucose-Capillary: 133 mg/dL — ABNORMAL HIGH (ref 70–99)

## 2012-10-04 MED ORDER — CIPROFLOXACIN HCL 500 MG PO TABS: 500.0000 mg | ORAL_TABLET | Freq: Two times a day (BID) | ORAL | Status: DC

## 2012-10-04 MED ORDER — REGADENOSON 0.4 MG/5ML IV SOLN
0.4000 mg | Freq: Once | INTRAVENOUS | Status: AC
  Administered 2012-10-04: 0.4 mg via INTRAVENOUS
  Filled 2012-10-04: qty 5

## 2012-10-04 MED ORDER — TECHNETIUM TC 99M SESTAMIBI GENERIC - CARDIOLITE
30.0000 | Freq: Once | INTRAVENOUS | Status: AC | PRN
  Administered 2012-10-04: 30 via INTRAVENOUS

## 2012-10-04 MED ORDER — TECHNETIUM TC 99M SESTAMIBI GENERIC - CARDIOLITE
10.0000 | Freq: Once | INTRAVENOUS | Status: AC | PRN
  Administered 2012-10-04: 10 via INTRAVENOUS

## 2012-10-04 MED ORDER — CIPROFLOXACIN HCL 500 MG PO TABS
500.0000 mg | ORAL_TABLET | Freq: Two times a day (BID) | ORAL | Status: DC
  Administered 2012-10-04: 500 mg via ORAL
  Filled 2012-10-04 (×2): qty 1

## 2012-10-04 MED ORDER — REGADENOSON 0.4 MG/5ML IV SOLN
INTRAVENOUS | Status: AC
  Filled 2012-10-04: qty 5
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
## 2012-10-04 NOTE — Progress Notes (Signed)
VASCULAR LAB PRELIMINARY  PRELIMINARY  PRELIMINARY  PRELIMINARY  Carotid duplex completed.    Preliminary report:  Bilateral:  No evidence of hemodynamically significant internal carotid artery stenosis.   Vertebral artery flow is antegrade.     Khiree Bukhari, RVS 10/04/2012, 10:31 AM

## 2012-10-04 NOTE — Progress Notes (Signed)
  Echocardiogram 2D Echocardiogram has been performed.  Keilen Kahl, [place] 10/04/2012, 11:06 AM

## 2012-10-04 NOTE — Progress Notes (Signed)
Utilization review completed. Anh Bigos, RN, BSN. 

## 2012-12-01 DIAGNOSIS — E119 Type 2 diabetes mellitus without complications: Secondary | ICD-10-CM | POA: Diagnosis not present

## 2012-12-22 ENCOUNTER — Encounter: Payer: Self-pay | Admitting: Internal Medicine

## 2013-01-20 ENCOUNTER — Other Ambulatory Visit (INDEPENDENT_AMBULATORY_CARE_PROVIDER_SITE_OTHER): Payer: Medicare Other

## 2013-01-20 DIAGNOSIS — I10 Essential (primary) hypertension: Secondary | ICD-10-CM | POA: Diagnosis not present

## 2013-01-20 DIAGNOSIS — E119 Type 2 diabetes mellitus without complications: Secondary | ICD-10-CM | POA: Diagnosis not present

## 2013-01-20 LAB — BASIC METABOLIC PANEL
CO2: 27 mEq/L (ref 19–32)
Calcium: 10.3 mg/dL (ref 8.4–10.5)
Creatinine, Ser: 1.5 mg/dL — ABNORMAL HIGH (ref 0.4–1.2)
GFR: 44.45 mL/min — ABNORMAL LOW (ref >60.00)
Sodium: 141 mEq/L (ref 135–145)

## 2013-01-20 LAB — HEMOGLOBIN A1C: Hgb A1c MFr Bld: 6.1 % (ref 4.6–6.5)

## 2013-01-23 ENCOUNTER — Encounter: Payer: Self-pay | Admitting: Internal Medicine

## 2013-01-23 ENCOUNTER — Ambulatory Visit (INDEPENDENT_AMBULATORY_CARE_PROVIDER_SITE_OTHER): Payer: Medicare Other | Admitting: Internal Medicine

## 2013-01-23 VITALS — BP 140/84 | HR 72 | Temp 98.3°F | Resp 12 | Wt 144.0 lb

## 2013-01-23 DIAGNOSIS — E119 Type 2 diabetes mellitus without complications: Secondary | ICD-10-CM

## 2013-01-23 DIAGNOSIS — K219 Gastro-esophageal reflux disease without esophagitis: Secondary | ICD-10-CM

## 2013-01-23 DIAGNOSIS — R1310 Dysphagia, unspecified: Secondary | ICD-10-CM

## 2013-01-23 DIAGNOSIS — I10 Essential (primary) hypertension: Secondary | ICD-10-CM

## 2013-01-23 DIAGNOSIS — R55 Syncope and collapse: Secondary | ICD-10-CM

## 2013-01-23 DIAGNOSIS — N259 Disorder resulting from impaired renal tubular function, unspecified: Secondary | ICD-10-CM

## 2013-01-23 NOTE — Assessment & Plan Note (Signed)
Continue with current prescription therapy as reflected on the Med list.  

## 2013-01-23 NOTE — Assessment & Plan Note (Signed)
Card f/u No relapse

## 2013-01-23 NOTE — Assessment & Plan Note (Signed)
Monitoring labs 

## 2013-01-23 NOTE — Assessment & Plan Note (Signed)
New dysphagia. On Pepcid and Prilosec. GI ref Dr Fuller Plan

## 2013-01-23 NOTE — Progress Notes (Signed)
   Subjective:    HPI  The patient presents for a follow-up of  chronic hypertension, chronic dyslipidemia, type 2 diabetes controlled with medicines C/o trouble swallowing x 2-3 months F/u suncope in 5/14 - never got to see Dr Marlou Porch in a f/u yet  Wt Readings from Last 3 Encounters:  01/23/13 144 lb (65.318 kg)  10/04/12 143 lb 8.3 oz (65.1 kg)  09/22/12 149 lb (67.586 kg)     BP Readings from Last 3 Encounters:  01/23/13 140/84  10/04/12 136/61  09/22/12 158/80        Review of Systems  Constitutional: Negative for chills, activity change, appetite change, fatigue and unexpected weight change.  HENT: Negative for congestion, mouth sores and sinus pressure.   Eyes: Negative for visual disturbance.  Respiratory: Negative for cough, chest tightness and wheezing.   Cardiovascular: Negative for chest pain, palpitations and leg swelling.  Gastrointestinal: Negative for nausea, vomiting, abdominal pain and diarrhea.  Genitourinary: Negative for frequency, difficulty urinating and vaginal pain.  Musculoskeletal: Negative for back pain and gait problem.  Skin: Negative for pallor and rash.  Neurological: Negative for dizziness, tremors, seizures, weakness, light-headedness, numbness and headaches.  Psychiatric/Behavioral: Negative for suicidal ideas, confusion and sleep disturbance.       Objective:   Physical Exam  Constitutional: She appears well-developed and well-nourished. No distress.  HENT:  Head: Normocephalic.  Right Ear: External ear normal.  Left Ear: External ear normal.  Nose: Nose normal.  Mouth/Throat: Oropharynx is clear and moist.  Eyes: Conjunctivae are normal. Pupils are equal, round, and reactive to light. Right eye exhibits no discharge. Left eye exhibits no discharge.  Neck: Normal range of motion. Neck supple. No JVD present. No tracheal deviation present. No thyromegaly present.  Cardiovascular: Normal rate, regular rhythm and normal heart sounds.    Pulmonary/Chest: No stridor. No respiratory distress. She has no wheezes.  Abdominal: Soft. Bowel sounds are normal. She exhibits no distension and no mass. There is no tenderness. There is no rebound and no guarding.  Musculoskeletal: She exhibits no edema and no tenderness.  Lymphadenopathy:    She has no cervical adenopathy.  Neurological: She displays normal reflexes. No cranial nerve deficit. She exhibits normal muscle tone. Coordination normal.  Skin: No rash noted. No erythema.  Psychiatric: She has a normal mood and affect. Her behavior is normal. Judgment and thought content normal.  LS NT No wax B  Lab Results  Component Value Date   WBC 6.7 10/04/2012   HGB 12.1 10/04/2012   HCT 34.9* 10/04/2012   PLT 158 10/04/2012   GLUCOSE 114* 01/20/2013   CHOL 144 09/19/2012   TRIG 103.0 09/19/2012   HDL 47.90 09/19/2012   LDLDIRECT 78.9 10/22/2008   LDLCALC 76 09/19/2012   ALT 25 10/03/2012   AST 24 10/03/2012   NA 141 01/20/2013   K 4.4 01/20/2013   CL 108 01/20/2013   CREATININE 1.5* 01/20/2013   BUN 18 01/20/2013   CO2 27 01/20/2013   TSH 2.21 09/19/2012   INR 1.03 08/16/2011   HGBA1C 6.1 01/20/2013   MICROALBUR 0.6 12/25/2010       Assessment & Plan:

## 2013-01-30 ENCOUNTER — Encounter: Payer: Self-pay | Admitting: Gastroenterology

## 2013-02-28 ENCOUNTER — Ambulatory Visit (INDEPENDENT_AMBULATORY_CARE_PROVIDER_SITE_OTHER): Payer: Medicare Other | Admitting: Cardiology

## 2013-02-28 ENCOUNTER — Encounter: Payer: Self-pay | Admitting: Cardiology

## 2013-02-28 VITALS — BP 130/62 | HR 67 | Ht 59.0 in | Wt 146.0 lb

## 2013-02-28 DIAGNOSIS — R55 Syncope and collapse: Secondary | ICD-10-CM

## 2013-02-28 DIAGNOSIS — R079 Chest pain, unspecified: Secondary | ICD-10-CM | POA: Diagnosis not present

## 2013-02-28 NOTE — Patient Instructions (Signed)
Your physician recommends that you continue on your current medications as directed. Please refer to the Current Medication list given to you today.  Your physician recommends that you follow-up as needed.  
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
## 2013-03-14 ENCOUNTER — Other Ambulatory Visit (INDEPENDENT_AMBULATORY_CARE_PROVIDER_SITE_OTHER): Payer: Medicare Other

## 2013-03-14 ENCOUNTER — Ambulatory Visit (INDEPENDENT_AMBULATORY_CARE_PROVIDER_SITE_OTHER): Payer: Medicare Other | Admitting: Gastroenterology

## 2013-03-14 ENCOUNTER — Encounter: Payer: Self-pay | Admitting: Gastroenterology

## 2013-03-14 ENCOUNTER — Other Ambulatory Visit: Payer: Self-pay | Admitting: *Deleted

## 2013-03-14 VITALS — BP 120/66 | HR 80 | Ht 59.0 in | Wt 145.8 lb

## 2013-03-14 DIAGNOSIS — K219 Gastro-esophageal reflux disease without esophagitis: Secondary | ICD-10-CM | POA: Diagnosis not present

## 2013-03-14 DIAGNOSIS — R1319 Other dysphagia: Secondary | ICD-10-CM

## 2013-03-14 DIAGNOSIS — R300 Dysuria: Secondary | ICD-10-CM | POA: Diagnosis not present

## 2013-03-14 LAB — URINALYSIS, ROUTINE W REFLEX MICROSCOPIC
Nitrite: NEGATIVE
Specific Gravity, Urine: 1.02 (ref 1.000–1.030)
Urine Glucose: NEGATIVE
Urobilinogen, UA: 0.2 (ref 0.0–1.0)

## 2013-03-14 MED ORDER — OMEPRAZOLE 20 MG PO CPDR: 20.0000 mg | DELAYED_RELEASE_CAPSULE | Freq: Two times a day (BID) | ORAL | Status: DC

## 2013-03-14 NOTE — Progress Notes (Signed)
    History of Present Illness: This is a 72 year old female who has been treated for GERD for several years with daily omeprazole and daily famotidine. She states she has occasional heartburn but her symptoms have generally been under good control. For the past 5 months she notes intermittent difficulty swallowing solid foods and she feels the food occasionally lodges in her mid chest. Denies weight loss, abdominal pain, constipation, diarrhea, change in stool caliber, melena, hematochezia, nausea, vomiting,  chest pain.  Current Medications, Allergies, Past Medical History, Past Surgical History, Family History and Social History were reviewed in [employer] record.  Physical Exam: General: Well developed , well nourished, no acute distress Head: Normocephalic and atraumatic Eyes:  sclerae anicteric, EOMI Ears: Normal auditory acuity Mouth: No deformity or lesions Lungs: Clear throughout to auscultation Heart: Regular rate and rhythm; no murmurs, rubs or bruits Abdomen: Soft, non tender and non distended. No masses, hepatosplenomegaly or hernias noted. Normal Bowel sounds Musculoskeletal: Symmetrical with no gross deformities  Pulses:  Normal pulses noted Extremities: No clubbing, cyanosis, edema or deformities noted Neurological: Alert oriented x 4, grossly nonfocal Psychological:  Alert and cooperative. Normal mood and affect  Assessment and Recommendations:  1. Dysphagia and GERD. Rule out esophageal stricture, esophagitis, motility disorder. Schedule barium esophagram. Schedule upper endoscopy. Increase omeprazole to 20 mg twice a day. Standard antireflux measures. The risks, benefits, and alternatives to endoscopy with possible biopsy and possible dilation were discussed with the patient and they consent to proceed.

## 2013-03-14 NOTE — Patient Instructions (Signed)
We have sent the following medications to your pharmacy for you to pick up at your convenience: omeprazole to take one tablet by mouth twice daily.   You have been scheduled for a Barium Esophogram at [facility] Radiology (1st floor of the hospital) on 03/17/13 at 9:30am. Please arrive 15 minutes prior to your appointment for registration. Make certain not to have anything to eat or drink 6 hours prior to your test. If you need to reschedule for any reason, please contact radiology at [phone] to do so. __________________________________________________________________ A barium swallow is an examination that concentrates on views of the esophagus. This tends to be a double contrast exam (barium and two liquids which, when combined, create a gas to distend the wall of the oesophagus) or single contrast (non-ionic iodine based). The study is usually tailored to your symptoms so a good history is essential. Attention is paid during the study to the form, structure and configuration of the esophagus, looking for functional disorders (such as aspiration, dysphagia, achalasia, motility and reflux) EXAMINATION You may be asked to change into a gown, depending on the type of swallow being performed. A radiologist and radiographer will perform the procedure. The radiologist will advise you of the type of contrast selected for your procedure and direct you during the exam. You will be asked to stand, sit or lie in several different positions and to hold a small amount of fluid in your mouth before being asked to swallow while the imaging is performed .In some instances you may be asked to swallow barium coated marshmallows to assess the motility of a solid food bolus. The exam can be recorded as a digital or video fluoroscopy procedure. POST PROCEDURE It will take 1-2 days for the barium to pass through your system. To facilitate this, it is important, unless otherwise directed, to increase your fluids for the  next 24-48hrs and to resume your normal diet.  This test typically takes about 30 minutes to perform. __________________________________________________________________________________  Dennis Bast have been scheduled for an endoscopy with propofol. Please follow written instructions given to you at your visit today. If you use inhalers (even only as needed), please bring them with you on the day of your procedure.  You have been scheduled to see Dr. Alain Marion on 03/15/13 3:00pm. If you need to reschedule or cancel please call [phone].  Thank you for choosing me and [facility].  Pricilla Riffle. Dagoberto Ligas., MD., [facility] Regal

## 2013-03-15 ENCOUNTER — Ambulatory Visit: Payer: Medicare Other

## 2013-03-15 ENCOUNTER — Ambulatory Visit: Payer: Medicare Other | Admitting: Internal Medicine

## 2013-03-15 DIAGNOSIS — N3090 Cystitis, unspecified without hematuria: Secondary | ICD-10-CM | POA: Diagnosis not present

## 2013-03-17 ENCOUNTER — Ambulatory Visit: Payer: Medicare Other

## 2013-03-17 ENCOUNTER — Telehealth: Payer: Self-pay

## 2013-03-17 ENCOUNTER — Ambulatory Visit (HOSPITAL_COMMUNITY)
Admission: RE | Admit: 2013-03-17 | Discharge: 2013-03-17 | Disposition: A | Discharge status: Home / Self Care | Payer: Medicare Other | Source: Ambulatory Visit | Attending: Gastroenterology | Admitting: Gastroenterology

## 2013-03-17 DIAGNOSIS — E119 Type 2 diabetes mellitus without complications: Secondary | ICD-10-CM

## 2013-03-17 DIAGNOSIS — R1310 Dysphagia, unspecified: Secondary | ICD-10-CM | POA: Diagnosis not present

## 2013-03-17 DIAGNOSIS — K449 Diaphragmatic hernia without obstruction or gangrene: Secondary | ICD-10-CM | POA: Insufficient documentation

## 2013-03-17 DIAGNOSIS — R1319 Other dysphagia: Secondary | ICD-10-CM

## 2013-03-17 DIAGNOSIS — K224 Dyskinesia of esophagus: Secondary | ICD-10-CM | POA: Diagnosis not present

## 2013-03-17 DIAGNOSIS — K219 Gastro-esophageal reflux disease without esophagitis: Secondary | ICD-10-CM

## 2013-03-17 MED ORDER — CEFUROXIME AXETIL 250 MG PO TABS: 250.0000 mg | ORAL_TABLET | Freq: Two times a day (BID) | ORAL | Status: DC

## 2013-03-17 NOTE — Telephone Encounter (Signed)
Patient had a urine analysis done on 03/14/2013 and was suppose to be sent off for culture, however the urine sample was lost and was not sent off. Patient came back in on 03/17/2013 to give another sample. Patient was called and stated that she was not having any symptoms of a UTI, but she was concerned about a sore that was on her vagina.  Please advice, Thanks!

## 2013-03-17 NOTE — Telephone Encounter (Signed)
Will call in abx - Ceftin If there is a vaginal sore present - pls see your gynecologist Thx

## 2013-03-18 LAB — URINE CULTURE: Organism ID, Bacteria: NO GROWTH

## 2013-03-20 ENCOUNTER — Telehealth: Payer: Self-pay | Admitting: Gastroenterology

## 2013-03-20 NOTE — Telephone Encounter (Signed)
Patient should be calling for Dr. Alain Marion.  I have transferred her to his office

## 2013-03-22 ENCOUNTER — Telehealth: Payer: Self-pay

## 2013-03-22 NOTE — Telephone Encounter (Signed)
Patient informed of results.  

## 2013-03-22 NOTE — Telephone Encounter (Signed)
Message copied by Hanley Seamen on Wed Mar 22, 2013  5:41 PM ------      Message from: Cassandria Anger      Created: Mon Mar 20, 2013 11:09 PM       Erline Levine, please, inform patient that her urine cx was negative      Thx       ------

## 2013-04-12 ENCOUNTER — Other Ambulatory Visit: Payer: Self-pay | Admitting: Internal Medicine

## 2013-05-02 ENCOUNTER — Encounter: Payer: Self-pay | Admitting: Gastroenterology

## 2013-05-02 ENCOUNTER — Ambulatory Visit (AMBULATORY_SURGERY_CENTER): Payer: Medicare Other | Admitting: Gastroenterology

## 2013-05-02 VITALS — BP 137/62 | HR 67 | Temp 98.8°F | Resp 14 | Ht 59.0 in | Wt 145.0 lb

## 2013-05-02 DIAGNOSIS — E119 Type 2 diabetes mellitus without complications: Secondary | ICD-10-CM | POA: Diagnosis not present

## 2013-05-02 DIAGNOSIS — K219 Gastro-esophageal reflux disease without esophagitis: Secondary | ICD-10-CM | POA: Diagnosis not present

## 2013-05-02 DIAGNOSIS — R1310 Dysphagia, unspecified: Secondary | ICD-10-CM | POA: Diagnosis not present

## 2013-05-02 DIAGNOSIS — R1319 Other dysphagia: Secondary | ICD-10-CM | POA: Diagnosis not present

## 2013-05-02 DIAGNOSIS — I10 Essential (primary) hypertension: Secondary | ICD-10-CM | POA: Diagnosis not present

## 2013-05-02 DIAGNOSIS — Z885 Allergy status to narcotic agent status: Secondary | ICD-10-CM | POA: Diagnosis not present

## 2013-05-02 LAB — GLUCOSE, CAPILLARY
Glucose-Capillary: 121 mg/dL — ABNORMAL HIGH (ref 70–99)
Glucose-Capillary: 116 mg/dL — ABNORMAL HIGH (ref 70–99)

## 2013-05-02 MED ORDER — SODIUM CHLORIDE 0.9 % IV SOLN: 500.0000 mL | INTRAVENOUS | Status: DC

## 2013-05-02 NOTE — Op Note (Signed)
[facility]  [address]. [place], [id number]   ENDOSCOPY PROCEDURE REPORT  PATIENT: Brandy, Gonzales  MR#: [id number] BIRTHDATE: 04-18-1941 , 72  yrs. old GENDER: Female ENDOSCOPIST: Ladene Artist, MD, [facility] PROCEDURE DATE:  05/02/2013 PROCEDURE:  EGD, diagnostic and Savary dilation of esophagus ASA CLASS:     Class II INDICATIONS:  Dysphagia.   History of esophageal reflux. MEDICATIONS: MAC sedation, administered by CRNA and propofol (Diprivan) 100mg  IV TOPICAL ANESTHETIC: Cetacaine Spray DESCRIPTION OF PROCEDURE: After the risks benefits and alternatives of the procedure were thoroughly explained, informed consent was obtained.  The LB [id number] endoscope was introduced through the mouth and advanced to the second portion of the duodenum. Without limitations.  The instrument was slowly withdrawn as the mucosa was fully examined.  ESOPHAGUS: The mucosa of the esophagus appeared normal. STOMACH: The mucosa and folds of the stomach appeared normal. DUODENUM: Mild duodenal inflammation was found in the duodenal bulb. The duodenal mucosa showed no abnormalities in the 2nd part of the duodenum.  Retroflexed views revealed no abnormalities. A guidewire was placed and the scope was then withdrawn from the patient. a 17 mm Savary dilator was passed to 42 cm for dysphagia without a stricture noted. No resistance or heme noted. The dilator and guidewire were removed and the procedure completed.  COMPLICATIONS: There were no complications.  ENDOSCOPIC IMPRESSION: 1.   The mucosa of the esophagus and stomach appeared normal 2.   Mild duodenitis in the duodenal bulb  RECOMMENDATIONS: 1.  Anti-reflux regimen long term 2.  Continue PPI daily long term 3.  Post dilation instructions  eSigned:  Ladene Artist, MD, [facility] 05/02/2013 3:38 PM

## 2013-05-02 NOTE — Progress Notes (Signed)
Called to room to assist during endoscopic procedure.  Patient ID and intended procedure confirmed with present staff. Received instructions for my participation in the procedure from the performing physician.  

## 2013-05-02 NOTE — Patient Instructions (Signed)

## 2013-05-02 NOTE — Progress Notes (Signed)
Lidocaine-40mg IV prior to Propofol InductionPropofol given over incremental dosages 

## 2013-05-03 ENCOUNTER — Telehealth: Payer: Self-pay | Admitting: *Deleted

## 2013-05-03 NOTE — Telephone Encounter (Signed)
Dr. Lynne Leader recommendation given to pt and understanding voiced.

## 2013-05-03 NOTE — Telephone Encounter (Signed)
I spoke with pt to advise her to go to [facility] ER since [place] doesn't have an open appointment until 11:00 a.m. and she states, "My pain is much better now.  I did a salt water gargle and am able to swallow much better now.  I have eaten my breakfast and feel much, much better." Rates pain as a "4" and getting better and better "by the minute".  I told her that if her pain gets worse or she has difficulty swallowing to go to [facility] ER ASAP.  I will let Dr. Fuller Plan know this as well  Dr. Fuller Plan, I just wanted to double check with you that this was ok or if you still wanted her to go to ED

## 2013-05-03 NOTE — Telephone Encounter (Signed)
If her symptoms do not resolve over next 24-48 hours she should contact us.

## 2013-05-03 NOTE — Telephone Encounter (Signed)
Office evaluation by APP urgently this morning. If there are no open appts before 10am then to [facility] ED for urgent evaluation.

## 2013-05-03 NOTE — Telephone Encounter (Signed)
  Follow up Call-  Call back number 05/02/2013  Post procedure Call Back phone  # [phone]  Permission to leave phone message Yes     Patient questions:  Do you have a fever, pain , or abdominal swelling? yes Pain Score  9 *  Have you tolerated food without any problems? no  Have you been able to return to your normal activities? yes  Do you have any questions about your discharge instructions: Diet   no Medications  no Follow up visit  no  Do you have questions or concerns about your Care? no  Actions: * If pain score is 4 or above: Physician/ provider Notified : Lucio Edward, MD.   On call backs this morning, pt states, "It hurts too much to eat or even swallow."  Rates throat pain as an "8"  No SOB and difficulty breathing.  She states, "The only food I have been able to get down is some chicken noodle soup and I have to hold my head to the left so it will go down."  She states she has not tried to eat or drink much because it is too painful.    Dr. Fuller Plan, Please advice  Cyril Mourning

## 2013-05-16 ENCOUNTER — Other Ambulatory Visit: Payer: Self-pay | Admitting: Internal Medicine

## 2013-05-19 ENCOUNTER — Other Ambulatory Visit: Payer: Self-pay

## 2013-05-19 MED ORDER — CARVEDILOL 25 MG PO TABS: 25.0000 mg | ORAL_TABLET | Freq: Two times a day (BID) | ORAL | Status: DC

## 2013-05-22 ENCOUNTER — Other Ambulatory Visit: Payer: Self-pay | Admitting: *Deleted

## 2013-05-22 ENCOUNTER — Other Ambulatory Visit (INDEPENDENT_AMBULATORY_CARE_PROVIDER_SITE_OTHER): Payer: Medicare Other

## 2013-05-22 DIAGNOSIS — E119 Type 2 diabetes mellitus without complications: Secondary | ICD-10-CM | POA: Diagnosis not present

## 2013-05-22 DIAGNOSIS — N259 Disorder resulting from impaired renal tubular function, unspecified: Secondary | ICD-10-CM

## 2013-05-22 LAB — BASIC METABOLIC PANEL
BUN: 22 mg/dL (ref 6–23)
CALCIUM: 10.3 mg/dL (ref 8.4–10.5)
CO2: 27 mEq/L (ref 19–32)
Chloride: 105 mEq/L (ref 96–112)
Creatinine, Ser: 1.5 mg/dL — ABNORMAL HIGH (ref 0.4–1.2)
GFR: 44.76 mL/min — ABNORMAL LOW (ref >60.00)
Glucose, Bld: 157 mg/dL — ABNORMAL HIGH (ref 70–99)
Potassium: 4.5 mEq/L (ref 3.5–5.1)
Sodium: 141 mEq/L (ref 135–145)

## 2013-05-22 LAB — HEMOGLOBIN A1C: HEMOGLOBIN A1C: 6.4 % (ref 4.6–6.5)

## 2013-05-23 ENCOUNTER — Encounter: Payer: Self-pay | Admitting: Internal Medicine

## 2013-05-23 ENCOUNTER — Ambulatory Visit (INDEPENDENT_AMBULATORY_CARE_PROVIDER_SITE_OTHER): Payer: Medicare Other | Admitting: Internal Medicine

## 2013-05-23 VITALS — BP 162/82 | HR 72 | Temp 97.1°F | Resp 16 | Wt 143.0 lb

## 2013-05-23 DIAGNOSIS — I10 Essential (primary) hypertension: Secondary | ICD-10-CM | POA: Diagnosis not present

## 2013-05-23 DIAGNOSIS — N259 Disorder resulting from impaired renal tubular function, unspecified: Secondary | ICD-10-CM | POA: Diagnosis not present

## 2013-05-23 DIAGNOSIS — R635 Abnormal weight gain: Secondary | ICD-10-CM

## 2013-05-23 DIAGNOSIS — E119 Type 2 diabetes mellitus without complications: Secondary | ICD-10-CM | POA: Diagnosis not present

## 2013-05-23 NOTE — Assessment & Plan Note (Signed)
Continue with current prescription therapy as reflected on the Med list.  

## 2013-05-23 NOTE — Progress Notes (Signed)
Pre visit review using our clinic review tool, if applicable. No additional management support is needed unless otherwise documented below in the visit note. 

## 2013-05-23 NOTE — Progress Notes (Signed)
Patient ID: [name], female   DOB: 1940-06-05, 73 y.o.   MRN: [id number]   Subjective:    HPI  The patient presents for a follow-up of  chronic hypertension, chronic dyslipidemia, type 2 diabetes controlled with medicines C/o trouble swallowing x 2-3 months F/u suncope in 5/14 - never got to see Dr Marlou Porch in a f/u yet  Wt Readings from Last 3 Encounters:  05/23/13 143 lb (64.864 kg)  05/02/13 145 lb (65.772 kg)  03/14/13 145 lb 12.8 oz (66.134 kg)     BP Readings from Last 3 Encounters:  05/23/13 162/82  05/02/13 137/62  03/14/13 120/66        Review of Systems  Constitutional: Negative for chills, activity change, appetite change, fatigue and unexpected weight change.  HENT: Negative for congestion, mouth sores and sinus pressure.   Eyes: Negative for visual disturbance.  Respiratory: Negative for cough, chest tightness and wheezing.   Cardiovascular: Negative for chest pain, palpitations and leg swelling.  Gastrointestinal: Negative for nausea, vomiting, abdominal pain and diarrhea.  Genitourinary: Negative for frequency, difficulty urinating and vaginal pain.  Musculoskeletal: Negative for back pain and gait problem.  Skin: Negative for pallor and rash.  Neurological: Negative for dizziness, tremors, seizures, weakness, light-headedness, numbness and headaches.  Psychiatric/Behavioral: Negative for suicidal ideas, confusion and sleep disturbance.       Objective:   Physical Exam  Constitutional: She appears well-developed and well-nourished. No distress.  HENT:  Head: Normocephalic.  Right Ear: External ear normal.  Left Ear: External ear normal.  Nose: Nose normal.  Mouth/Throat: Oropharynx is clear and moist.  Eyes: Conjunctivae are normal. Pupils are equal, round, and reactive to light. Right eye exhibits no discharge. Left eye exhibits no discharge.  Neck: Normal range of motion. Neck supple. No JVD present. No tracheal deviation present. No thyromegaly  present.  Cardiovascular: Normal rate, regular rhythm and normal heart sounds.   Pulmonary/Chest: No stridor. No respiratory distress. She has no wheezes.  Abdominal: Soft. Bowel sounds are normal. She exhibits no distension and no mass. There is no tenderness. There is no rebound and no guarding.  Musculoskeletal: She exhibits no edema and no tenderness.  Lymphadenopathy:    She has no cervical adenopathy.  Neurological: She displays normal reflexes. No cranial nerve deficit. She exhibits normal muscle tone. Coordination normal.  Skin: No rash noted. No erythema.  Psychiatric: She has a normal mood and affect. Her behavior is normal. Judgment and thought content normal.  LS NT No wax B  Lab Results  Component Value Date   WBC 6.7 10/04/2012   HGB 12.1 10/04/2012   HCT 34.9* 10/04/2012   PLT 158 10/04/2012   GLUCOSE 157* 05/22/2013   CHOL 144 09/19/2012   TRIG 103.0 09/19/2012   HDL 47.90 09/19/2012   LDLDIRECT 78.9 10/22/2008   LDLCALC 76 09/19/2012   ALT 25 10/03/2012   AST 24 10/03/2012   NA 141 05/22/2013   K 4.5 05/22/2013   CL 105 05/22/2013   CREATININE 1.5* 05/22/2013   BUN 22 05/22/2013   CO2 27 05/22/2013   TSH 2.21 09/19/2012   INR 1.03 08/16/2011   HGBA1C 6.4 05/22/2013   MICROALBUR 0.6 12/25/2010       Assessment & Plan:

## 2013-05-23 NOTE — Assessment & Plan Note (Signed)
Better  

## 2013-05-23 NOTE — Assessment & Plan Note (Signed)
Labs

## 2013-05-24 ENCOUNTER — Telehealth: Payer: Self-pay

## 2013-05-24 NOTE — Telephone Encounter (Signed)
Relevant patient education mailed to patient.  

## 2013-09-25 ENCOUNTER — Other Ambulatory Visit (INDEPENDENT_AMBULATORY_CARE_PROVIDER_SITE_OTHER): Payer: Medicare Other

## 2013-09-25 DIAGNOSIS — E119 Type 2 diabetes mellitus without complications: Secondary | ICD-10-CM | POA: Diagnosis not present

## 2013-09-25 DIAGNOSIS — I10 Essential (primary) hypertension: Secondary | ICD-10-CM

## 2013-09-25 DIAGNOSIS — R635 Abnormal weight gain: Secondary | ICD-10-CM

## 2013-09-25 DIAGNOSIS — N259 Disorder resulting from impaired renal tubular function, unspecified: Secondary | ICD-10-CM

## 2013-09-25 LAB — BASIC METABOLIC PANEL
BUN: 18 mg/dL (ref 6–23)
CHLORIDE: 108 meq/L (ref 96–112)
CO2: 26 mEq/L (ref 19–32)
CREATININE: 1.3 mg/dL — AB (ref 0.4–1.2)
Calcium: 10.2 mg/dL (ref 8.4–10.5)
GFR: 51.08 mL/min — ABNORMAL LOW (ref >60.00)
GLUCOSE: 120 mg/dL — AB (ref 70–99)
Potassium: 4.5 mEq/L (ref 3.5–5.1)
Sodium: 141 mEq/L (ref 135–145)

## 2013-09-25 LAB — LIPID PANEL
Cholesterol: 171 mg/dL (ref 0–200)
HDL: 44.4 mg/dL (ref >39.00)
LDL CALC: 101 mg/dL — AB (ref 0–99)
Total CHOL/HDL Ratio: 4
Triglycerides: 126 mg/dL (ref 0.0–149.0)
VLDL: 25.2 mg/dL (ref 0.0–40.0)

## 2013-09-25 LAB — HEPATIC FUNCTION PANEL
ALK PHOS: 68 U/L (ref 39–117)
ALT: 28 U/L (ref 0–35)
AST: 23 U/L (ref 0–37)
Albumin: 4 g/dL (ref 3.5–5.2)
BILIRUBIN DIRECT: 0.1 mg/dL (ref 0.0–0.3)
Total Bilirubin: 1.2 mg/dL (ref 0.2–1.2)
Total Protein: 7 g/dL (ref 6.0–8.3)

## 2013-09-25 LAB — HEMOGLOBIN A1C: Hgb A1c MFr Bld: 6.2 % (ref 4.6–6.5)

## 2013-09-26 ENCOUNTER — Encounter: Payer: Self-pay | Admitting: Internal Medicine

## 2013-09-26 ENCOUNTER — Ambulatory Visit (INDEPENDENT_AMBULATORY_CARE_PROVIDER_SITE_OTHER): Payer: Medicare Other | Admitting: Internal Medicine

## 2013-09-26 VITALS — BP 140/80 | HR 72 | Temp 98.7°F | Resp 16 | Wt 149.0 lb

## 2013-09-26 DIAGNOSIS — N259 Disorder resulting from impaired renal tubular function, unspecified: Secondary | ICD-10-CM

## 2013-09-26 DIAGNOSIS — E119 Type 2 diabetes mellitus without complications: Secondary | ICD-10-CM

## 2013-09-26 DIAGNOSIS — I10 Essential (primary) hypertension: Secondary | ICD-10-CM

## 2013-09-26 DIAGNOSIS — R1310 Dysphagia, unspecified: Secondary | ICD-10-CM | POA: Diagnosis not present

## 2013-09-26 DIAGNOSIS — R635 Abnormal weight gain: Secondary | ICD-10-CM

## 2013-09-26 DIAGNOSIS — K219 Gastro-esophageal reflux disease without esophagitis: Secondary | ICD-10-CM

## 2013-09-26 DIAGNOSIS — L219 Seborrheic dermatitis, unspecified: Secondary | ICD-10-CM

## 2013-09-26 MED ORDER — TRIAMCINOLONE ACETONIDE 0.5 % EX CREA: 1.0000 "application " | TOPICAL_CREAM | Freq: Three times a day (TID) | CUTANEOUS | Status: DC

## 2013-09-26 MED ORDER — VITAMIN D 1000 UNITS PO TABS: 1000.0000 [IU] | ORAL_TABLET | Freq: Every day | ORAL | Status: AC

## 2013-09-26 NOTE — Assessment & Plan Note (Signed)
Continue with current prescription therapy as reflected on the Med list.  

## 2013-09-26 NOTE — Progress Notes (Signed)
Pre visit review using our clinic review tool, if applicable. No additional management support is needed unless otherwise documented below in the visit note. 

## 2013-09-26 NOTE — Assessment & Plan Note (Signed)
Resolved on rx

## 2013-09-26 NOTE — Assessment & Plan Note (Signed)
better 

## 2013-09-26 NOTE — Assessment & Plan Note (Signed)
Triamc cream 

## 2013-09-26 NOTE — Assessment & Plan Note (Signed)
Cut back on carbs 

## 2013-09-26 NOTE — Progress Notes (Signed)
   Subjective:    HPI  The patient presents for a follow-up of  chronic hypertension, chronic dyslipidemia, type 2 diabetes controlled with medicines  F/u trouble swallowing - resolved F/u syncope in 5/14 - never got to see Dr Marlou Porch in a f/u, no relapse  Wt Readings from Last 3 Encounters:  09/26/13 149 lb (67.586 kg)  05/23/13 143 lb (64.864 kg)  05/02/13 145 lb (65.772 kg)     BP Readings from Last 3 Encounters:  09/26/13 140/80  05/23/13 162/82  05/02/13 137/62        Review of Systems  Constitutional: Negative for chills, activity change, appetite change, fatigue and unexpected weight change.  HENT: Negative for congestion, mouth sores and sinus pressure.   Eyes: Negative for visual disturbance.  Respiratory: Negative for cough, chest tightness and wheezing.   Cardiovascular: Negative for chest pain, palpitations and leg swelling.  Gastrointestinal: Negative for nausea, vomiting, abdominal pain and diarrhea.  Genitourinary: Negative for frequency, difficulty urinating and vaginal pain.  Musculoskeletal: Negative for back pain and gait problem.  Skin: Negative for pallor and rash.  Neurological: Negative for dizziness, tremors, seizures, weakness, light-headedness, numbness and headaches.  Psychiatric/Behavioral: Negative for suicidal ideas, confusion and sleep disturbance.       Objective:   Physical Exam  Constitutional: She appears well-developed and well-nourished. No distress.  HENT:  Head: Normocephalic.  Right Ear: External ear normal.  Left Ear: External ear normal.  Nose: Nose normal.  Mouth/Throat: Oropharynx is clear and moist.  Eyes: Conjunctivae are normal. Pupils are equal, round, and reactive to light. Right eye exhibits no discharge. Left eye exhibits no discharge.  Neck: Normal range of motion. Neck supple. No JVD present. No tracheal deviation present. No thyromegaly present.  Cardiovascular: Normal rate, regular rhythm and normal heart  sounds.   Pulmonary/Chest: No stridor. No respiratory distress. She has no wheezes.  Abdominal: Soft. Bowel sounds are normal. She exhibits no distension and no mass. There is no tenderness. There is no rebound and no guarding.  Musculoskeletal: She exhibits no edema and no tenderness.  Lymphadenopathy:    She has no cervical adenopathy.  Neurological: She displays normal reflexes. No cranial nerve deficit. She exhibits normal muscle tone. Coordination normal.  Skin: No rash noted. No erythema.  Psychiatric: She has a normal mood and affect. Her behavior is normal. Judgment and thought content normal.  LS NT No wax B; SD in ears  Lab Results  Component Value Date   WBC 6.7 10/04/2012   HGB 12.1 10/04/2012   HCT 34.9* 10/04/2012   PLT 158 10/04/2012   GLUCOSE 120* 09/25/2013   CHOL 171 09/25/2013   TRIG 126.0 09/25/2013   HDL 44.40 09/25/2013   LDLDIRECT 78.9 10/22/2008   LDLCALC 101* 09/25/2013   ALT 28 09/25/2013   AST 23 09/25/2013   NA 141 09/25/2013   K 4.5 09/25/2013   CL 108 09/25/2013   CREATININE 1.3* 09/25/2013   BUN 18 09/25/2013   CO2 26 09/25/2013   TSH 2.21 09/19/2012   INR 1.03 08/16/2011   HGBA1C 6.2 09/25/2013   MICROALBUR 0.6 12/25/2010       Assessment & Plan:

## 2013-11-12 ENCOUNTER — Other Ambulatory Visit: Payer: Self-pay | Admitting: Internal Medicine

## 2013-11-26 ENCOUNTER — Other Ambulatory Visit: Payer: Self-pay | Admitting: Internal Medicine

## 2013-12-07 DIAGNOSIS — H2510 Age-related nuclear cataract, unspecified eye: Secondary | ICD-10-CM | POA: Diagnosis not present

## 2013-12-07 DIAGNOSIS — E119 Type 2 diabetes mellitus without complications: Secondary | ICD-10-CM | POA: Diagnosis not present

## 2013-12-25 DIAGNOSIS — H2510 Age-related nuclear cataract, unspecified eye: Secondary | ICD-10-CM | POA: Diagnosis not present

## 2014-01-01 DIAGNOSIS — H2589 Other age-related cataract: Secondary | ICD-10-CM | POA: Diagnosis not present

## 2014-01-01 DIAGNOSIS — H2510 Age-related nuclear cataract, unspecified eye: Secondary | ICD-10-CM | POA: Diagnosis not present

## 2014-01-08 DIAGNOSIS — H2510 Age-related nuclear cataract, unspecified eye: Secondary | ICD-10-CM | POA: Diagnosis not present

## 2014-01-24 ENCOUNTER — Ambulatory Visit (INDEPENDENT_AMBULATORY_CARE_PROVIDER_SITE_OTHER): Payer: Medicare Other

## 2014-01-24 DIAGNOSIS — E119 Type 2 diabetes mellitus without complications: Secondary | ICD-10-CM | POA: Diagnosis not present

## 2014-01-24 DIAGNOSIS — H2589 Other age-related cataract: Secondary | ICD-10-CM | POA: Diagnosis not present

## 2014-01-24 DIAGNOSIS — H2510 Age-related nuclear cataract, unspecified eye: Secondary | ICD-10-CM | POA: Diagnosis not present

## 2014-01-24 DIAGNOSIS — N259 Disorder resulting from impaired renal tubular function, unspecified: Secondary | ICD-10-CM | POA: Diagnosis not present

## 2014-01-24 DIAGNOSIS — I10 Essential (primary) hypertension: Secondary | ICD-10-CM | POA: Diagnosis not present

## 2014-01-24 DIAGNOSIS — R635 Abnormal weight gain: Secondary | ICD-10-CM | POA: Diagnosis not present

## 2014-01-24 LAB — URINALYSIS, ROUTINE W REFLEX MICROSCOPIC
Bilirubin Urine: NEGATIVE
HGB URINE DIPSTICK: NEGATIVE
Ketones, ur: NEGATIVE
Nitrite: NEGATIVE
Specific Gravity, Urine: 1.01 (ref 1.000–1.030)
Total Protein, Urine: NEGATIVE
UROBILINOGEN UA: 0.2 (ref 0.0–1.0)
Urine Glucose: NEGATIVE
pH: 5.5 (ref 5.0–8.0)

## 2014-01-24 LAB — HEMOGLOBIN A1C: Hgb A1c MFr Bld: 6.1 % (ref 4.6–6.5)

## 2014-01-25 LAB — CBC WITH DIFFERENTIAL/PLATELET
BASOS PCT: 0.3 % (ref 0.0–3.0)
Basophils Absolute: 0 10*3/uL (ref 0.0–0.1)
EOS PCT: 2 % (ref 0.0–5.0)
Eosinophils Absolute: 0.1 10*3/uL (ref 0.0–0.7)
HEMATOCRIT: 37.5 % (ref 36.0–46.0)
HEMOGLOBIN: 12.7 g/dL (ref 12.0–15.0)
LYMPHS ABS: 1.9 10*3/uL (ref 0.7–4.0)
Lymphocytes Relative: 30.8 % (ref 12.0–46.0)
MCHC: 33.8 g/dL (ref 30.0–36.0)
MCV: 91.9 fl (ref 78.0–100.0)
MONOS PCT: 7.7 % (ref 3.0–12.0)
Monocytes Absolute: 0.5 10*3/uL (ref 0.1–1.0)
NEUTROS ABS: 3.7 10*3/uL (ref 1.4–7.7)
Neutrophils Relative %: 59.2 % (ref 43.0–77.0)
Platelets: 164 10*3/uL (ref 150.0–400.0)
RBC: 4.08 Mil/uL (ref 3.87–5.11)
RDW: 13.6 % (ref 11.5–15.5)
WBC: 6.3 10*3/uL (ref 4.0–10.5)

## 2014-01-25 LAB — BASIC METABOLIC PANEL
BUN: 21 mg/dL (ref 6–23)
CO2: 25 mEq/L (ref 19–32)
Calcium: 10 mg/dL (ref 8.4–10.5)
Chloride: 107 mEq/L (ref 96–112)
Creatinine, Ser: 1.4 mg/dL — ABNORMAL HIGH (ref 0.4–1.2)
GFR: 45.75 mL/min — AB (ref >60.00)
GLUCOSE: 236 mg/dL — AB (ref 70–99)
POTASSIUM: 4 meq/L (ref 3.5–5.1)
SODIUM: 139 meq/L (ref 135–145)

## 2014-01-25 LAB — TSH: TSH: 1.29 u[IU]/mL (ref 0.35–4.50)

## 2014-01-25 LAB — HEPATIC FUNCTION PANEL
ALBUMIN: 4.1 g/dL (ref 3.5–5.2)
ALK PHOS: 73 U/L (ref 39–117)
ALT: 24 U/L (ref 0–35)
AST: 27 U/L (ref 0–37)
Bilirubin, Direct: 0.1 mg/dL (ref 0.0–0.3)
TOTAL PROTEIN: 7.3 g/dL (ref 6.0–8.3)
Total Bilirubin: 1.1 mg/dL (ref 0.2–1.2)

## 2014-01-29 ENCOUNTER — Encounter: Payer: Self-pay | Admitting: Internal Medicine

## 2014-01-29 ENCOUNTER — Ambulatory Visit (INDEPENDENT_AMBULATORY_CARE_PROVIDER_SITE_OTHER): Payer: Medicare Other | Admitting: Internal Medicine

## 2014-01-29 VITALS — BP 130/76 | HR 76 | Temp 98.8°F | Resp 16 | Ht 59.0 in | Wt 145.0 lb

## 2014-01-29 DIAGNOSIS — I10 Essential (primary) hypertension: Secondary | ICD-10-CM

## 2014-01-29 DIAGNOSIS — N259 Disorder resulting from impaired renal tubular function, unspecified: Secondary | ICD-10-CM

## 2014-01-29 DIAGNOSIS — Z0000 Encounter for general adult medical examination without abnormal findings: Secondary | ICD-10-CM | POA: Diagnosis not present

## 2014-01-29 DIAGNOSIS — E119 Type 2 diabetes mellitus without complications: Secondary | ICD-10-CM

## 2014-01-29 NOTE — Progress Notes (Signed)
   Subjective:    HPI The patient is here for a wellness exam. The patient has been doing well overall without major physical or psychological issues going on lately.  The patient presents for a follow-up of  chronic hypertension, chronic dyslipidemia, type 2 diabetes controlled with medicines.   Wt Readings from Last 3 Encounters:  01/29/14 145 lb (65.772 kg)  09/26/13 149 lb (67.586 kg)  05/23/13 143 lb (64.864 kg)     BP Readings from Last 3 Encounters:  01/29/14 130/76  09/26/13 140/80  05/23/13 162/82        Review of Systems  Constitutional: Negative for chills, activity change, appetite change, fatigue and unexpected weight change.  HENT: Negative for congestion, mouth sores and sinus pressure.   Eyes: Negative for visual disturbance.  Respiratory: Negative for cough, chest tightness and wheezing.   Cardiovascular: Negative for chest pain and leg swelling.  Gastrointestinal: Negative for nausea and abdominal pain.  Genitourinary: Negative for frequency, difficulty urinating and vaginal pain.  Musculoskeletal: Negative for back pain and gait problem.  Skin: Negative for pallor and rash.  Neurological: Negative for dizziness, tremors, weakness, numbness and headaches.  Psychiatric/Behavioral: Negative for suicidal ideas, confusion and sleep disturbance.       Objective:   Physical Exam  Constitutional: She appears well-developed and well-nourished. No distress.  HENT:  Head: Normocephalic.  Right Ear: External ear normal.  Left Ear: External ear normal.  Nose: Nose normal.  Mouth/Throat: Oropharynx is clear and moist.  Eyes: Conjunctivae are normal. Pupils are equal, round, and reactive to light. Right eye exhibits no discharge. Left eye exhibits no discharge.  Neck: Normal range of motion. Neck supple. No JVD present. No tracheal deviation present. No thyromegaly present.  Cardiovascular: Normal rate, regular rhythm and normal heart sounds.    Pulmonary/Chest: No stridor. No respiratory distress. She has no wheezes.  Abdominal: Soft. Bowel sounds are normal. She exhibits no distension and no mass. There is no tenderness. There is no rebound and no guarding.  Musculoskeletal: She exhibits no edema and no tenderness.  Lymphadenopathy:    She has no cervical adenopathy.  Neurological: She displays normal reflexes. No cranial nerve deficit. She exhibits normal muscle tone. Coordination normal.  Skin: No rash noted. No erythema.  Psychiatric: She has a normal mood and affect. Her behavior is normal. Judgment and thought content normal.  LS NT No wax B  Lab Results  Component Value Date   WBC 6.3 01/24/2014   HGB 12.7 01/24/2014   HCT 37.5 01/24/2014   PLT 164.0 01/24/2014   GLUCOSE 236* 01/24/2014   CHOL 171 09/25/2013   TRIG 126.0 09/25/2013   HDL 44.40 09/25/2013   LDLDIRECT 78.9 10/22/2008   LDLCALC 101* 09/25/2013   ALT 24 01/24/2014   AST 27 01/24/2014   NA 139 01/24/2014   K 4.0 01/24/2014   CL 107 01/24/2014   CREATININE 1.4* 01/24/2014   BUN 21 01/24/2014   CO2 25 01/24/2014   TSH 1.29 01/24/2014   INR 1.03 08/16/2011   HGBA1C 6.1 01/24/2014   MICROALBUR 0.6 12/25/2010       Assessment & Plan:

## 2014-01-29 NOTE — Assessment & Plan Note (Signed)
Continue with current prescription therapy as reflected on the Med list.  

## 2014-01-29 NOTE — Assessment & Plan Note (Addendum)
Here for medicare wellness/physical  Diet: heart healthy  Physical activity: not sedentary  Depression/mood screen: negative  Hearing: intact to whispered voice  Visual acuity: grossly normal, performs annual eye exam  ADLs: capable  Fall risk: low Home safety: good  Cognitive evaluation: intact to orientation, naming, recall and repetition  EOL planning: adv directives, full code/ I agree  I have personally reviewed and have noted  1. The patient's medical and social history  2. Their use of alcohol, tobacco or illicit drugs  3. Their current medications and supplements  4. The patient's functional ability including ADL's, fall risks, home safety risks and hearing or visual impairment.  5. Diet and physical activities  6. Evidence for depression or mood disorders    Today patient counseled on age appropriate routine health concerns for screening and prevention, each reviewed and up to date or declined. Immunizations reviewed and up to date or declined. Labs ordered and reviewed. Risk factors for depression reviewed and negative. Hearing function and visual acuity are intact. ADLs screened and addressed as needed. Functional ability and level of safety reviewed and appropriate. Education, counseling and referrals performed based on assessed risks today. Patient provided with a copy of personalized plan for preventive services.   Declined shots Colon due 2017

## 2014-01-29 NOTE — Progress Notes (Signed)
Pre visit review using our clinic review tool, if applicable. No additional management support is needed unless otherwise documented below in the visit note. 

## 2014-01-29 NOTE — Assessment & Plan Note (Signed)
Chronic - taking Glimeperide

## 2014-01-29 NOTE — Patient Instructions (Signed)
Wt Readings from Last 3 Encounters:  01/29/14 145 lb (65.772 kg)  09/26/13 149 lb (67.586 kg)  05/23/13 143 lb (64.864 kg)    Preventive Care for Adults A healthy lifestyle and preventive care can promote health and wellness. Preventive health guidelines for women include the following key practices.  A routine yearly physical is a good way to check with your health care provider about your health and preventive screening. It is a chance to share any concerns and updates on your health and to receive a thorough exam.  Visit your dentist for a routine exam and preventive care every 6 months. Brush your teeth twice a day and floss once a day. Good oral hygiene prevents tooth decay and gum disease.  The frequency of eye exams is based on your age, health, family medical history, use of contact lenses, and other factors. Follow your health care provider's recommendations for frequency of eye exams.  Eat a healthy diet. Foods like vegetables, fruits, whole grains, low-fat dairy products, and lean protein foods contain the nutrients you need without too many calories. Decrease your intake of foods high in solid fats, added sugars, and salt. Eat the right amount of calories for you.Get information about a proper diet from your health care provider, if necessary.  Regular physical exercise is one of the most important things you can do for your health. Most adults should get at least 150 minutes of moderate-intensity exercise (any activity that increases your heart rate and causes you to sweat) each week. In addition, most adults need muscle-strengthening exercises on 2 or more days a week.  Maintain a healthy weight. The body mass index (BMI) is a screening tool to identify possible weight problems. It provides an estimate of body fat based on height and weight. Your health care provider can find your BMI and can help you achieve or maintain a healthy weight.For adults 20 years and older:  A BMI  below 18.5 is considered underweight.  A BMI of 18.5 to 24.9 is normal.  A BMI of 25 to 29.9 is considered overweight.  A BMI of 30 and above is considered obese.  Maintain normal blood lipids and cholesterol levels by exercising and minimizing your intake of saturated fat. Eat a balanced diet with plenty of fruit and vegetables. Blood tests for lipids and cholesterol should begin at age 55 and be repeated every 5 years. If your lipid or cholesterol levels are high, you are over 50, or you are at high risk for heart disease, you may need your cholesterol levels checked more frequently.Ongoing high lipid and cholesterol levels should be treated with medicines if diet and exercise are not working.  If you smoke, find out from your health care provider how to quit. If you do not use tobacco, do not start.  Lung cancer screening is recommended for adults aged 26-80 years who are at high risk for developing lung cancer because of a history of smoking. A yearly low-dose CT scan of the lungs is recommended for people who have at least a 30-pack-year history of smoking and are a current smoker or have quit within the past 15 years. A pack year of smoking is smoking an average of 1 pack of cigarettes a day for 1 year (for example: 1 pack a day for 30 years or 2 packs a day for 15 years). Yearly screening should continue until the smoker has stopped smoking for at least 15 years. Yearly screening should be stopped for people  who develop a health problem that would prevent them from having lung cancer treatment.  If you are pregnant, do not drink alcohol. If you are breastfeeding, be very cautious about drinking alcohol. If you are not pregnant and choose to drink alcohol, do not have more than 1 drink per day. One drink is considered to be 12 ounces (355 mL) of beer, 5 ounces (148 mL) of wine, or 1.5 ounces (44 mL) of liquor.  Avoid use of street drugs. Do not share needles with anyone. Ask for help if you  need support or instructions about stopping the use of drugs.  High blood pressure causes heart disease and increases the risk of stroke. Your blood pressure should be checked at least every 1 to 2 years. Ongoing high blood pressure should be treated with medicines if weight loss and exercise do not work.  If you are 22-60 years old, ask your health care provider if you should take aspirin to prevent strokes.  Diabetes screening involves taking a blood sample to check your fasting blood sugar level. This should be done once every 3 years, after age 38, if you are within normal weight and without risk factors for diabetes. Testing should be considered at a younger age or be carried out more frequently if you are overweight and have at least 1 risk factor for diabetes.  Breast cancer screening is essential preventive care for women. You should practice "breast self-awareness." This means understanding the normal appearance and feel of your breasts and may include breast self-examination. Any changes detected, no matter how small, should be reported to a health care provider. Women in their 7s and 30s should have a clinical breast exam (CBE) by a health care provider as part of a regular health exam every 1 to 3 years. After age 83, women should have a CBE every year. Starting at age 49, women should consider having a mammogram (breast X-ray test) every year. Women who have a family history of breast cancer should talk to their health care provider about genetic screening. Women at a high risk of breast cancer should talk to their health care providers about having an MRI and a mammogram every year.  Breast cancer gene (BRCA)-related cancer risk assessment is recommended for women who have family members with BRCA-related cancers. BRCA-related cancers include breast, ovarian, tubal, and peritoneal cancers. Having family members with these cancers may be associated with an increased risk for harmful changes  (mutations) in the breast cancer genes BRCA1 and BRCA2. Results of the assessment will determine the need for genetic counseling and BRCA1 and BRCA2 testing.  Routine pelvic exams to screen for cancer are no longer recommended for nonpregnant women who are considered low risk for cancer of the pelvic organs (ovaries, uterus, and vagina) and who do not have symptoms. Ask your health care provider if a screening pelvic exam is right for you.  If you have had past treatment for cervical cancer or a condition that could lead to cancer, you need Pap tests and screening for cancer for at least 20 years after your treatment. If Pap tests have been discontinued, your risk factors (such as having a new sexual partner) need to be reassessed to determine if screening should be resumed. Some women have medical problems that increase the chance of getting cervical cancer. In these cases, your health care provider may recommend more frequent screening and Pap tests.  The HPV test is an additional test that may be used for cervical  cancer screening. The HPV test looks for the virus that can cause the cell changes on the cervix. The cells collected during the Pap test can be tested for HPV. The HPV test could be used to screen women aged 70 years and older, and should be used in women of any age who have unclear Pap test results. After the age of 74, women should have HPV testing at the same frequency as a Pap test.  Colorectal cancer can be detected and often prevented. Most routine colorectal cancer screening begins at the age of 52 years and continues through age 49 years. However, your health care provider may recommend screening at an earlier age if you have risk factors for colon cancer. On a yearly basis, your health care provider may provide home test kits to check for hidden blood in the stool. Use of a small camera at the end of a tube, to directly examine the colon (sigmoidoscopy or colonoscopy), can detect the  earliest forms of colorectal cancer. Talk to your health care provider about this at age 53, when routine screening begins. Direct exam of the colon should be repeated every 5-10 years through age 39 years, unless early forms of pre-cancerous polyps or small growths are found.  People who are at an increased risk for hepatitis B should be screened for this virus. You are considered at high risk for hepatitis B if:  You were born in a country where hepatitis B occurs often. Talk with your health care provider about which countries are considered high risk.  Your parents were born in a high-risk country and you have not received a shot to protect against hepatitis B (hepatitis B vaccine).  You have HIV or AIDS.  You use needles to inject street drugs.  You live with, or have sex with, someone who has hepatitis B.  You get hemodialysis treatment.  You take certain medicines for conditions like cancer, organ transplantation, and autoimmune conditions.  Hepatitis C blood testing is recommended for all people born from [birth year] through 1965 and any individual with known risks for hepatitis C.  Practice safe sex. Use condoms and avoid high-risk sexual practices to reduce the spread of sexually transmitted infections (STIs). STIs include gonorrhea, chlamydia, syphilis, trichomonas, herpes, HPV, and human immunodeficiency virus (HIV). Herpes, HIV, and HPV are viral illnesses that have no cure. They can result in disability, cancer, and death.  You should be screened for sexually transmitted illnesses (STIs) including gonorrhea and chlamydia if:  You are sexually active and are younger than 24 years.  You are older than 24 years and your health care provider tells you that you are at risk for this type of infection.  Your sexual activity has changed since you were last screened and you are at an increased risk for chlamydia or gonorrhea. Ask your health care provider if you are at risk.  If you are  at risk of being infected with HIV, it is recommended that you take a prescription medicine daily to prevent HIV infection. This is called preexposure prophylaxis (PrEP). You are considered at risk if:  You are a heterosexual woman, are sexually active, and are at increased risk for HIV infection.  You take drugs by injection.  You are sexually active with a partner who has HIV.  Talk with your health care provider about whether you are at high risk of being infected with HIV. If you choose to begin PrEP, you should first be tested for HIV. You should  then be tested every 3 months for as long as you are taking PrEP.  Osteoporosis is a disease in which the bones lose minerals and strength with aging. This can result in serious bone fractures or breaks. The risk of osteoporosis can be identified using a bone density scan. Women ages 48 years and over and women at risk for fractures or osteoporosis should discuss screening with their health care providers. Ask your health care provider whether you should take a calcium supplement or vitamin D to reduce the rate of osteoporosis.  Menopause can be associated with physical symptoms and risks. Hormone replacement therapy is available to decrease symptoms and risks. You should talk to your health care provider about whether hormone replacement therapy is right for you.  Use sunscreen. Apply sunscreen liberally and repeatedly throughout the day. You should seek shade when your shadow is shorter than you. Protect yourself by wearing long sleeves, pants, a wide-brimmed hat, and sunglasses year round, whenever you are outdoors.  Once a month, do a whole body skin exam, using a mirror to look at the skin on your back. Tell your health care provider of new moles, moles that have irregular borders, moles that are larger than a pencil eraser, or moles that have changed in shape or color.  Stay current with required vaccines (immunizations).  Influenza vaccine.  All adults should be immunized every year.  Tetanus, diphtheria, and acellular pertussis (Td, Tdap) vaccine. Pregnant women should receive 1 dose of Tdap vaccine during each pregnancy. The dose should be obtained regardless of the length of time since the last dose. Immunization is preferred during the 27th-36th week of gestation. An adult who has not previously received Tdap or who does not know her vaccine status should receive 1 dose of Tdap. This initial dose should be followed by tetanus and diphtheria toxoids (Td) booster doses every 10 years. Adults with an unknown or incomplete history of completing a 3-dose immunization series with Td-containing vaccines should begin or complete a primary immunization series including a Tdap dose. Adults should receive a Td booster every 10 years.  Varicella vaccine. An adult without evidence of immunity to varicella should receive 2 doses or a second dose if she has previously received 1 dose. Pregnant females who do not have evidence of immunity should receive the first dose after pregnancy. This first dose should be obtained before leaving the health care facility. The second dose should be obtained 4-8 weeks after the first dose.  Human papillomavirus (HPV) vaccine. Females aged 13-26 years who have not received the vaccine previously should obtain the 3-dose series. The vaccine is not recommended for use in pregnant females. However, pregnancy testing is not needed before receiving a dose. If a female is found to be pregnant after receiving a dose, no treatment is needed. In that case, the remaining doses should be delayed until after the pregnancy. Immunization is recommended for any person with an immunocompromised condition through the age of 52 years if she did not get any or all doses earlier. During the 3-dose series, the second dose should be obtained 4-8 weeks after the first dose. The third dose should be obtained 24 weeks after the first dose and 16  weeks after the second dose.  Zoster vaccine. One dose is recommended for adults aged 1 years or older unless certain conditions are present.  Measles, mumps, and rubella (MMR) vaccine. Adults born before [birth year] generally are considered immune to measles and mumps. Adults born in [birth year]  or later should have 1 or more doses of MMR vaccine unless there is a contraindication to the vaccine or there is laboratory evidence of immunity to each of the three diseases. A routine second dose of MMR vaccine should be obtained at least 28 days after the first dose for students attending postsecondary schools, health care workers, or international travelers. People who received inactivated measles vaccine or an unknown type of measles vaccine during 1963-1967 should receive 2 doses of MMR vaccine. People who received inactivated mumps vaccine or an unknown type of mumps vaccine before 1979 and are at high risk for mumps infection should consider immunization with 2 doses of MMR vaccine. For females of childbearing age, rubella immunity should be determined. If there is no evidence of immunity, females who are not pregnant should be vaccinated. If there is no evidence of immunity, females who are pregnant should delay immunization until after pregnancy. Unvaccinated health care workers born before [birth year] who lack laboratory evidence of measles, mumps, or rubella immunity or laboratory confirmation of disease should consider measles and mumps immunization with 2 doses of MMR vaccine or rubella immunization with 1 dose of MMR vaccine.  Pneumococcal 13-valent conjugate (PCV13) vaccine. When indicated, a person who is uncertain of her immunization history and has no record of immunization should receive the PCV13 vaccine. An adult aged 74 years or older who has certain medical conditions and has not been previously immunized should receive 1 dose of PCV13 vaccine. This PCV13 should be followed with a dose of pneumococcal  polysaccharide (PPSV23) vaccine. The PPSV23 vaccine dose should be obtained at least 8 weeks after the dose of PCV13 vaccine. An adult aged 38 years or older who has certain medical conditions and previously received 1 or more doses of PPSV23 vaccine should receive 1 dose of PCV13. The PCV13 vaccine dose should be obtained 1 or more years after the last PPSV23 vaccine dose.  Pneumococcal polysaccharide (PPSV23) vaccine. When PCV13 is also indicated, PCV13 should be obtained first. All adults aged 49 years and older should be immunized. An adult younger than age 91 years who has certain medical conditions should be immunized. Any person who resides in a nursing home or long-term care facility should be immunized. An adult smoker should be immunized. People with an immunocompromised condition and certain other conditions should receive both PCV13 and PPSV23 vaccines. People with human immunodeficiency virus (HIV) infection should be immunized as soon as possible after diagnosis. Immunization during chemotherapy or radiation therapy should be avoided. Routine use of PPSV23 vaccine is not recommended for American Indians, [place]atives, or people younger than 65 years unless there are medical conditions that require PPSV23 vaccine. When indicated, people who have unknown immunization and have no record of immunization should receive PPSV23 vaccine. One-time revaccination 5 years after the first dose of PPSV23 is recommended for people aged 19-64 years who have chronic kidney failure, nephrotic syndrome, asplenia, or immunocompromised conditions. People who received 1-2 doses of PPSV23 before age 74 years should receive another dose of PPSV23 vaccine at age 81 years or later if at least 5 years have passed since the previous dose. Doses of PPSV23 are not needed for people immunized with PPSV23 at or after age 24 years.  Meningococcal vaccine. Adults with asplenia or persistent complement component deficiencies  should receive 2 doses of quadrivalent meningococcal conjugate (MenACWY-D) vaccine. The doses should be obtained at least 2 months apart. Microbiologists working with certain meningococcal bacteria, [employer] recruits, people at risk during  an outbreak, and people who travel to or live in countries with a high rate of meningitis should be immunized. A first-year college student up through age 40 years who is living in a residence hall should receive a dose if she did not receive a dose on or after her 16th birthday. Adults who have certain high-risk conditions should receive one or more doses of vaccine.  Hepatitis A vaccine. Adults who wish to be protected from this disease, have certain high-risk conditions, work with hepatitis A-infected animals, work in hepatitis A research labs, or travel to or work in countries with a high rate of hepatitis A should be immunized. Adults who were previously unvaccinated and who anticipate close contact with an international adoptee during the first 60 days after arrival in the [place] from a country with a high rate of hepatitis A should be immunized.  Hepatitis B vaccine. Adults who wish to be protected from this disease, have certain high-risk conditions, may be exposed to blood or other infectious body fluids, are household contacts or sex partners of hepatitis B positive people, are clients or workers in certain care facilities, or travel to or work in countries with a high rate of hepatitis B should be immunized.  Haemophilus influenzae type b (Hib) vaccine. A previously unvaccinated person with asplenia or sickle cell disease or having a scheduled splenectomy should receive 1 dose of Hib vaccine. Regardless of previous immunization, a recipient of a hematopoietic stem cell transplant should receive a 3-dose series 6-12 months after her successful transplant. Hib vaccine is not recommended for adults with HIV infection. Preventive Services / Frequency Ages 53  to 31 years  Blood pressure check.** / Every 1 to 2 years.  Lipid and cholesterol check.** / Every 5 years beginning at age 57.  Clinical breast exam.** / Every 3 years for women in their 43s and 26s.  BRCA-related cancer risk assessment.** / For women who have family members with a BRCA-related cancer (breast, ovarian, tubal, or peritoneal cancers).  Pap test.** / Every 2 years from ages 57 through 54. Every 3 years starting at age 67 through age 5 or 38 with a history of 3 consecutive normal Pap tests.  HPV screening.** / Every 3 years from ages 45 through ages 39 to 35 with a history of 3 consecutive normal Pap tests.  Hepatitis C blood test.** / For any individual with known risks for hepatitis C.  Skin self-exam. / Monthly.  Influenza vaccine. / Every year.  Tetanus, diphtheria, and acellular pertussis (Tdap, Td) vaccine.** / Consult your health care provider. Pregnant women should receive 1 dose of Tdap vaccine during each pregnancy. 1 dose of Td every 10 years.  Varicella vaccine.** / Consult your health care provider. Pregnant females who do not have evidence of immunity should receive the first dose after pregnancy.  HPV vaccine. / 3 doses over 6 months, if 26 and younger. The vaccine is not recommended for use in pregnant females. However, pregnancy testing is not needed before receiving a dose.  Measles, mumps, rubella (MMR) vaccine.** / You need at least 1 dose of MMR if you were born in 1957 or later. You may also need a 2nd dose. For females of childbearing age, rubella immunity should be determined. If there is no evidence of immunity, females who are not pregnant should be vaccinated. If there is no evidence of immunity, females who are pregnant should delay immunization until after pregnancy.  Pneumococcal 13-valent conjugate (PCV13) vaccine.** / Consult  your health care provider.  Pneumococcal polysaccharide (PPSV23) vaccine.** / 1 to 2 doses if you smoke cigarettes  or if you have certain conditions.  Meningococcal vaccine.** / 1 dose if you are age 44 to 69 years and a Market researcher living in a residence hall, or have one of several medical conditions, you need to get vaccinated against meningococcal disease. You may also need additional booster doses.  Hepatitis A vaccine.** / Consult your health care provider.  Hepatitis B vaccine.** / Consult your health care provider.  Haemophilus influenzae type b (Hib) vaccine.** / Consult your health care provider. Ages 66 to 93 years  Blood pressure check.** / Every 1 to 2 years.  Lipid and cholesterol check.** / Every 5 years beginning at age 57 years.  Lung cancer screening. / Every year if you are aged 21-80 years and have a 30-pack-year history of smoking and currently smoke or have quit within the past 15 years. Yearly screening is stopped once you have quit smoking for at least 15 years or develop a health problem that would prevent you from having lung cancer treatment.  Clinical breast exam.** / Every year after age 69 years.  BRCA-related cancer risk assessment.** / For women who have family members with a BRCA-related cancer (breast, ovarian, tubal, or peritoneal cancers).  Mammogram.** / Every year beginning at age 78 years and continuing for as long as you are in good health. Consult with your health care provider.  Pap test.** / Every 3 years starting at age 63 years through age 42 or 105 years with a history of 3 consecutive normal Pap tests.  HPV screening.** / Every 3 years from ages 5 years through ages 58 to 37 years with a history of 3 consecutive normal Pap tests.  Fecal occult blood test (FOBT) of stool. / Every year beginning at age 52 years and continuing until age 60 years. You may not need to do this test if you get a colonoscopy every 10 years.  Flexible sigmoidoscopy or colonoscopy.** / Every 5 years for a flexible sigmoidoscopy or every 10 years for a colonoscopy  beginning at age 80 years and continuing until age 66 years.  Hepatitis C blood test.** / For all people born from [birth year] through 1966 and any individual with known risks for hepatitis C.  Skin self-exam. / Monthly.  Influenza vaccine. / Every year.  Tetanus, diphtheria, and acellular pertussis (Tdap/Td) vaccine.** / Consult your health care provider. Pregnant women should receive 1 dose of Tdap vaccine during each pregnancy. 1 dose of Td every 10 years.  Varicella vaccine.** / Consult your health care provider. Pregnant females who do not have evidence of immunity should receive the first dose after pregnancy.  Zoster vaccine.** / 1 dose for adults aged 20 years or older.  Measles, mumps, rubella (MMR) vaccine.** / You need at least 1 dose of MMR if you were born in 1958 or later. You may also need a 2nd dose. For females of childbearing age, rubella immunity should be determined. If there is no evidence of immunity, females who are not pregnant should be vaccinated. If there is no evidence of immunity, females who are pregnant should delay immunization until after pregnancy.  Pneumococcal 13-valent conjugate (PCV13) vaccine.** / Consult your health care provider.  Pneumococcal polysaccharide (PPSV23) vaccine.** / 1 to 2 doses if you smoke cigarettes or if you have certain conditions.  Meningococcal vaccine.** / Consult your health care provider.  Hepatitis A vaccine.** / Consult your  health care provider.  Hepatitis B vaccine.** / Consult your health care provider.  Haemophilus influenzae type b (Hib) vaccine.** / Consult your health care provider. Ages 39 years and over  Blood pressure check.** / Every 1 to 2 years.  Lipid and cholesterol check.** / Every 5 years beginning at age 47 years.  Lung cancer screening. / Every year if you are aged 6-80 years and have a 30-pack-year history of smoking and currently smoke or have quit within the past 15 years. Yearly screening is stopped  once you have quit smoking for at least 15 years or develop a health problem that would prevent you from having lung cancer treatment.  Clinical breast exam.** / Every year after age 92 years.  BRCA-related cancer risk assessment.** / For women who have family members with a BRCA-related cancer (breast, ovarian, tubal, or peritoneal cancers).  Mammogram.** / Every year beginning at age 7 years and continuing for as long as you are in good health. Consult with your health care provider.  Pap test.** / Every 3 years starting at age 29 years through age 56 or 29 years with 3 consecutive normal Pap tests. Testing can be stopped between 65 and 70 years with 3 consecutive normal Pap tests and no abnormal Pap or HPV tests in the past 10 years.  HPV screening.** / Every 3 years from ages 39 years through ages 69 or 75 years with a history of 3 consecutive normal Pap tests. Testing can be stopped between 65 and 70 years with 3 consecutive normal Pap tests and no abnormal Pap or HPV tests in the past 10 years.  Fecal occult blood test (FOBT) of stool. / Every year beginning at age 74 years and continuing until age 86 years. You may not need to do this test if you get a colonoscopy every 10 years.  Flexible sigmoidoscopy or colonoscopy.** / Every 5 years for a flexible sigmoidoscopy or every 10 years for a colonoscopy beginning at age 34 years and continuing until age 41 years.  Hepatitis C blood test.** / For all people born from [birth year] through 1966 and any individual with known risks for hepatitis C.  Osteoporosis screening.** / A one-time screening for women ages 73 years and over and women at risk for fractures or osteoporosis.  Skin self-exam. / Monthly.  Influenza vaccine. / Every year.  Tetanus, diphtheria, and acellular pertussis (Tdap/Td) vaccine.** / 1 dose of Td every 10 years.  Varicella vaccine.** / Consult your health care provider.  Zoster vaccine.** / 1 dose for adults aged 55 years  or older.  Pneumococcal 13-valent conjugate (PCV13) vaccine.** / Consult your health care provider.  Pneumococcal polysaccharide (PPSV23) vaccine.** / 1 dose for all adults aged 19 years and older.  Meningococcal vaccine.** / Consult your health care provider.  Hepatitis A vaccine.** / Consult your health care provider.  Hepatitis B vaccine.** / Consult your health care provider.  Haemophilus influenzae type b (Hib) vaccine.** / Consult your health care provider. ** Family history and personal history of risk and conditions may change your health care provider's recommendations. Document Released: 06/23/2001 Document Revised: 09/11/2013 Document Reviewed: 09/22/2010 [facility] Patient Information 2015 [place], [place]. This information is not intended to replace advice given to you by your health care provider. Make sure you discuss any questions you have with your health care provider.

## 2014-01-29 NOTE — Assessment & Plan Note (Signed)
Labs

## 2014-04-16 ENCOUNTER — Other Ambulatory Visit (INDEPENDENT_AMBULATORY_CARE_PROVIDER_SITE_OTHER): Payer: Medicare Other

## 2014-04-16 ENCOUNTER — Ambulatory Visit: Payer: Medicare Other | Admitting: Internal Medicine

## 2014-04-16 DIAGNOSIS — N259 Disorder resulting from impaired renal tubular function, unspecified: Secondary | ICD-10-CM | POA: Diagnosis not present

## 2014-04-16 DIAGNOSIS — E119 Type 2 diabetes mellitus without complications: Secondary | ICD-10-CM | POA: Diagnosis not present

## 2014-04-16 LAB — HEMOGLOBIN A1C: HEMOGLOBIN A1C: 6.2 % (ref 4.6–6.5)

## 2014-04-16 LAB — BASIC METABOLIC PANEL
BUN: 22 mg/dL (ref 6–23)
CO2: 26 mEq/L (ref 19–32)
Calcium: 10.1 mg/dL (ref 8.4–10.5)
Chloride: 109 mEq/L (ref 96–112)
Creatinine, Ser: 1.4 mg/dL — ABNORMAL HIGH (ref 0.4–1.2)
GFR: 46.47 mL/min — AB (ref >60.00)
Glucose, Bld: 139 mg/dL — ABNORMAL HIGH (ref 70–99)
Potassium: 4.7 mEq/L (ref 3.5–5.1)
SODIUM: 142 meq/L (ref 135–145)

## 2014-04-17 ENCOUNTER — Telehealth: Payer: Self-pay | Admitting: Internal Medicine

## 2014-04-17 DIAGNOSIS — K219 Gastro-esophageal reflux disease without esophagitis: Secondary | ICD-10-CM

## 2014-04-17 MED ORDER — OMEPRAZOLE 20 MG PO CPDR: 20.0000 mg | DELAYED_RELEASE_CAPSULE | Freq: Two times a day (BID) | ORAL | Status: DC

## 2014-04-17 MED ORDER — GLIMEPIRIDE 2 MG PO TABS: ORAL_TABLET | ORAL | Status: DC

## 2014-04-17 NOTE — Telephone Encounter (Signed)
Pt will be out of Glimepiride 2 mg and Omeprazole 20 mg caps in two days needs refill sent to [facility]. Pt showed up for appt on the wrong day and we cannot get back in until 12/28. Pls send refills.

## 2014-04-17 NOTE — Telephone Encounter (Signed)
Notified pt refill sent to [facility]...[name]

## 2014-04-17 NOTE — Telephone Encounter (Signed)
OK to fill both prescriptions with additional refills x11 Thank you!

## 2014-05-07 ENCOUNTER — Ambulatory Visit (INDEPENDENT_AMBULATORY_CARE_PROVIDER_SITE_OTHER): Payer: Medicare Other | Admitting: Internal Medicine

## 2014-05-07 ENCOUNTER — Encounter: Payer: Self-pay | Admitting: Internal Medicine

## 2014-05-07 VITALS — BP 146/84 | HR 70 | Temp 98.9°F | Wt 148.0 lb

## 2014-05-07 DIAGNOSIS — I10 Essential (primary) hypertension: Secondary | ICD-10-CM | POA: Diagnosis not present

## 2014-05-07 DIAGNOSIS — K219 Gastro-esophageal reflux disease without esophagitis: Secondary | ICD-10-CM | POA: Diagnosis not present

## 2014-05-07 DIAGNOSIS — E119 Type 2 diabetes mellitus without complications: Secondary | ICD-10-CM

## 2014-05-07 NOTE — Progress Notes (Signed)
   Subjective:       The patient presents for a follow-up of  chronic hypertension, chronic dyslipidemia, type 2 diabetes controlled with medicines.   Wt Readings from Last 3 Encounters:  05/07/14 148 lb (67.132 kg)  01/29/14 145 lb (65.772 kg)  09/26/13 149 lb (67.586 kg)     BP Readings from Last 3 Encounters:  05/07/14 146/84  01/29/14 130/76  09/26/13 140/80        Review of Systems  Constitutional: Negative for chills, activity change, appetite change, fatigue and unexpected weight change.  HENT: Negative for congestion, mouth sores and sinus pressure.   Eyes: Negative for visual disturbance.  Respiratory: Negative for cough, chest tightness and wheezing.   Cardiovascular: Negative for chest pain and leg swelling.  Gastrointestinal: Negative for nausea and abdominal pain.  Genitourinary: Negative for frequency, difficulty urinating and vaginal pain.  Musculoskeletal: Negative for back pain and gait problem.  Skin: Negative for pallor and rash.  Neurological: Negative for dizziness, tremors, weakness, numbness and headaches.  Psychiatric/Behavioral: Negative for suicidal ideas, confusion and sleep disturbance.       Objective:   Physical Exam  Constitutional: She appears well-developed and well-nourished. No distress.  HENT:  Head: Normocephalic.  Right Ear: External ear normal.  Left Ear: External ear normal.  Nose: Nose normal.  Mouth/Throat: Oropharynx is clear and moist.  Eyes: Conjunctivae are normal. Pupils are equal, round, and reactive to light. Right eye exhibits no discharge. Left eye exhibits no discharge.  Neck: Normal range of motion. Neck supple. No JVD present. No tracheal deviation present. No thyromegaly present.  Cardiovascular: Normal rate, regular rhythm and normal heart sounds.   Pulmonary/Chest: No stridor. No respiratory distress. She has no wheezes.  Abdominal: Soft. Bowel sounds are normal. She exhibits no distension and no mass.  There is no tenderness. There is no rebound and no guarding.  Musculoskeletal: She exhibits no edema and no tenderness.  Lymphadenopathy:    She has no cervical adenopathy.  Neurological: She displays normal reflexes. No cranial nerve deficit. She exhibits normal muscle tone. Coordination normal.  Skin: No rash noted. No erythema.  Psychiatric: She has a normal mood and affect. Her behavior is normal. Judgment and thought content normal.  LS NT No wax B  Lab Results  Component Value Date   WBC 6.3 01/24/2014   HGB 12.7 01/24/2014   HCT 37.5 01/24/2014   PLT 164.0 01/24/2014   GLUCOSE 139* 04/16/2014   CHOL 171 09/25/2013   TRIG 126.0 09/25/2013   HDL 44.40 09/25/2013   LDLDIRECT 78.9 10/22/2008   LDLCALC 101* 09/25/2013   ALT 24 01/24/2014   AST 27 01/24/2014   NA 142 04/16/2014   K 4.7 04/16/2014   CL 109 04/16/2014   CREATININE 1.4* 04/16/2014   BUN 22 04/16/2014   CO2 26 04/16/2014   TSH 1.29 01/24/2014   INR 1.03 08/16/2011   HGBA1C 6.2 04/16/2014   MICROALBUR 0.6 12/25/2010       Assessment & Plan:

## 2014-05-07 NOTE — Assessment & Plan Note (Signed)
Continue with current prescription therapy as reflected on the Med list.  

## 2014-05-08 ENCOUNTER — Telehealth: Payer: Self-pay | Admitting: Internal Medicine

## 2014-05-08 NOTE — Telephone Encounter (Signed)
emmi mailed  °

## 2014-06-12 ENCOUNTER — Telehealth: Payer: Self-pay | Admitting: Internal Medicine

## 2014-06-12 NOTE — Telephone Encounter (Signed)
Pt called in needs refill on carvedilol (COREG) 25 MG tablet [id number] , wants it sent to [facility] on file

## 2014-06-13 ENCOUNTER — Other Ambulatory Visit: Payer: Self-pay | Admitting: *Deleted

## 2014-06-13 MED ORDER — CARVEDILOL 25 MG PO TABS: 25.0000 mg | ORAL_TABLET | Freq: Two times a day (BID) | ORAL | Status: DC

## 2014-09-03 ENCOUNTER — Ambulatory Visit (INDEPENDENT_AMBULATORY_CARE_PROVIDER_SITE_OTHER): Payer: Medicare Other | Admitting: Internal Medicine

## 2014-09-03 ENCOUNTER — Other Ambulatory Visit (INDEPENDENT_AMBULATORY_CARE_PROVIDER_SITE_OTHER): Payer: Medicare Other

## 2014-09-03 ENCOUNTER — Encounter: Payer: Self-pay | Admitting: Internal Medicine

## 2014-09-03 VITALS — BP 139/84 | HR 70 | Wt 151.0 lb

## 2014-09-03 DIAGNOSIS — I10 Essential (primary) hypertension: Secondary | ICD-10-CM

## 2014-09-03 DIAGNOSIS — E119 Type 2 diabetes mellitus without complications: Secondary | ICD-10-CM

## 2014-09-03 DIAGNOSIS — N259 Disorder resulting from impaired renal tubular function, unspecified: Secondary | ICD-10-CM

## 2014-09-03 DIAGNOSIS — Z23 Encounter for immunization: Secondary | ICD-10-CM

## 2014-09-03 DIAGNOSIS — K219 Gastro-esophageal reflux disease without esophagitis: Secondary | ICD-10-CM

## 2014-09-03 LAB — BASIC METABOLIC PANEL
BUN: 14 mg/dL (ref 6–23)
CO2: 27 mEq/L (ref 19–32)
Calcium: 10.3 mg/dL (ref 8.4–10.5)
Chloride: 107 mEq/L (ref 96–112)
Creatinine, Ser: 1.38 mg/dL — ABNORMAL HIGH (ref 0.40–1.20)
GFR: 47.98 mL/min — ABNORMAL LOW (ref >60.00)
Glucose, Bld: 130 mg/dL — ABNORMAL HIGH (ref 70–99)
Potassium: 4.5 mEq/L (ref 3.5–5.1)
Sodium: 141 mEq/L (ref 135–145)

## 2014-09-03 LAB — HEMOGLOBIN A1C: Hgb A1c MFr Bld: 6 % (ref 4.6–6.5)

## 2014-09-03 MED ORDER — CARVEDILOL 25 MG PO TABS: 25.0000 mg | ORAL_TABLET | Freq: Two times a day (BID) | ORAL | Status: DC

## 2014-09-03 MED ORDER — GLIMEPIRIDE 2 MG PO TABS: ORAL_TABLET | ORAL | Status: DC

## 2014-09-03 MED ORDER — SPIRONOLACTONE 50 MG PO TABS
50.0000 mg | ORAL_TABLET | Freq: Every day | ORAL | Status: DC
Start: 2014-09-03 — End: 2014-11-01

## 2014-09-03 MED ORDER — OMEPRAZOLE 20 MG PO CPDR: 20.0000 mg | DELAYED_RELEASE_CAPSULE | Freq: Two times a day (BID) | ORAL | Status: DC

## 2014-09-03 NOTE — Assessment & Plan Note (Signed)
Labs

## 2014-09-03 NOTE — Patient Instructions (Signed)
Cut back on salt

## 2014-09-03 NOTE — Assessment & Plan Note (Signed)
Chronic - taking Glimeperide Labs

## 2014-09-03 NOTE — Assessment & Plan Note (Signed)
Chronic On Coreg, Spironolactone LABS

## 2014-09-03 NOTE — Progress Notes (Signed)
Pre visit review using our clinic review tool, if applicable. No additional management support is needed unless otherwise documented below in the visit note. 

## 2014-09-03 NOTE — Progress Notes (Signed)
   Subjective:    HPI  The patient presents for a follow-up of  chronic hypertension, chronic dyslipidemia, type 2 diabetes controlled with medicines.   Wt Readings from Last 3 Encounters:  09/03/14 151 lb (68.493 kg)  05/07/14 148 lb (67.132 kg)  01/29/14 145 lb (65.772 kg)     BP Readings from Last 3 Encounters:  09/03/14 139/84  05/07/14 146/84  01/29/14 130/76     Review of Systems  Constitutional: Negative for chills, activity change, appetite change, fatigue and unexpected weight change.  HENT: Negative for congestion, mouth sores and sinus pressure.   Eyes: Negative for visual disturbance.  Respiratory: Negative for cough, chest tightness and wheezing.   Cardiovascular: Negative for chest pain and leg swelling.  Gastrointestinal: Negative for nausea and abdominal pain.  Genitourinary: Negative for frequency, difficulty urinating and vaginal pain.  Musculoskeletal: Negative for back pain and gait problem.  Skin: Negative for pallor and rash.  Neurological: Negative for dizziness, tremors, weakness, numbness and headaches.  Psychiatric/Behavioral: Negative for suicidal ideas, confusion and sleep disturbance.       Objective:   Physical Exam  Constitutional: She appears well-developed and well-nourished. No distress.  HENT:  Head: Normocephalic.  Right Ear: External ear normal.  Left Ear: External ear normal.  Nose: Nose normal.  Mouth/Throat: Oropharynx is clear and moist.  Eyes: Conjunctivae are normal. Pupils are equal, round, and reactive to light. Right eye exhibits no discharge. Left eye exhibits no discharge.  Neck: Normal range of motion. Neck supple. No JVD present. No tracheal deviation present. No thyromegaly present.  Cardiovascular: Normal rate, regular rhythm and normal heart sounds.   Pulmonary/Chest: No stridor. No respiratory distress. She has no wheezes.  Abdominal: Soft. Bowel sounds are normal. She exhibits no distension and no mass. There is  no tenderness. There is no rebound and no guarding.  Musculoskeletal: She exhibits no edema or tenderness.  Lymphadenopathy:    She has no cervical adenopathy.  Neurological: She displays normal reflexes. No cranial nerve deficit. She exhibits normal muscle tone. Coordination normal.  Skin: No rash noted. No erythema.  Psychiatric: She has a normal mood and affect. Her behavior is normal. Judgment and thought content normal.  LS NT No wax B Trace edema B feet  Lab Results  Component Value Date   WBC 6.3 01/24/2014   HGB 12.7 01/24/2014   HCT 37.5 01/24/2014   PLT 164.0 01/24/2014   GLUCOSE 139* 04/16/2014   CHOL 171 09/25/2013   TRIG 126.0 09/25/2013   HDL 44.40 09/25/2013   LDLDIRECT 78.9 10/22/2008   LDLCALC 101* 09/25/2013   ALT 24 01/24/2014   AST 27 01/24/2014   NA 142 04/16/2014   K 4.7 04/16/2014   CL 109 04/16/2014   CREATININE 1.4* 04/16/2014   BUN 22 04/16/2014   CO2 26 04/16/2014   TSH 1.29 01/24/2014   INR 1.03 08/16/2011   HGBA1C 6.2 04/16/2014   MICROALBUR 0.6 12/25/2010       Assessment & Plan:

## 2014-09-18 DIAGNOSIS — H04123 Dry eye syndrome of bilateral lacrimal glands: Secondary | ICD-10-CM | POA: Diagnosis not present

## 2014-09-18 DIAGNOSIS — H11023 Central pterygium of eye, bilateral: Secondary | ICD-10-CM | POA: Diagnosis not present

## 2014-09-18 DIAGNOSIS — H26493 Other secondary cataract, bilateral: Secondary | ICD-10-CM | POA: Diagnosis not present

## 2014-09-18 LAB — HM DIABETES EYE EXAM

## 2014-10-15 ENCOUNTER — Emergency Department (INDEPENDENT_AMBULATORY_CARE_PROVIDER_SITE_OTHER)
Admission: EM | Admit: 2014-10-15 | Discharge: 2014-10-15 | Disposition: A | Discharge status: Home / Self Care | Payer: Medicare Other | Source: Home / Self Care | Attending: Family Medicine | Admitting: Family Medicine

## 2014-10-15 ENCOUNTER — Emergency Department (INDEPENDENT_AMBULATORY_CARE_PROVIDER_SITE_OTHER): Payer: Medicare Other

## 2014-10-15 ENCOUNTER — Encounter (HOSPITAL_COMMUNITY): Payer: Self-pay | Admitting: Emergency Medicine

## 2014-10-15 DIAGNOSIS — S8012XA Contusion of left lower leg, initial encounter: Secondary | ICD-10-CM | POA: Diagnosis not present

## 2014-10-15 NOTE — Discharge Instructions (Signed)
Thank you for coming in today.   Contusion A contusion is a deep bruise. Contusions are the result of an injury that caused bleeding under the skin. The contusion may turn blue, purple, or yellow. Minor injuries will give you a painless contusion, but more severe contusions may stay painful and swollen for a few weeks.  CAUSES  A contusion is usually caused by a blow, trauma, or direct force to an area of the body. SYMPTOMS   Swelling and redness of the injured area.  Bruising of the injured area.  Tenderness and soreness of the injured area.  Pain. DIAGNOSIS  The diagnosis can be made by taking a history and physical exam. An X-ray, CT scan, or MRI may be needed to determine if there were any associated injuries, such as fractures. TREATMENT  Specific treatment will depend on what area of the body was injured. In general, the best treatment for a contusion is resting, icing, elevating, and applying cold compresses to the injured area. Over-the-counter medicines may also be recommended for pain control. Ask your caregiver what the best treatment is for your contusion. HOME CARE INSTRUCTIONS   Put ice on the injured area.  Put ice in a plastic bag.  Place a towel between your skin and the bag.  Leave the ice on for 15-20 minutes, 3-4 times a day, or as directed by your health care provider.  Only take over-the-counter or prescription medicines for pain, discomfort, or fever as directed by your caregiver. Your caregiver may recommend avoiding anti-inflammatory medicines (aspirin, ibuprofen, and naproxen) for 48 hours because these medicines may increase bruising.  Rest the injured area.  If possible, elevate the injured area to reduce swelling. SEEK IMMEDIATE MEDICAL CARE IF:   You have increased bruising or swelling.  You have pain that is getting worse.  Your swelling or pain is not relieved with medicines. MAKE SURE YOU:   Understand these instructions.  Will watch your  condition.  Will get help right away if you are not doing well or get worse. Document Released: 02/04/2005 Document Revised: 05/02/2013 Document Reviewed: 03/02/2011 [facility] Patient Information 2015 [place], [place]. This information is not intended to replace advice given to you by your health care provider. Make sure you discuss any questions you have with your health care provider.
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
## 2014-10-15 NOTE — ED Notes (Signed)
Applied abd padding and secured w/3 in ace wrap

## 2014-10-23 ENCOUNTER — Encounter (HOSPITAL_COMMUNITY): Payer: Self-pay | Admitting: Emergency Medicine

## 2014-10-23 ENCOUNTER — Emergency Department (INDEPENDENT_AMBULATORY_CARE_PROVIDER_SITE_OTHER)
Admission: EM | Admit: 2014-10-23 | Discharge: 2014-10-23 | Disposition: A | Discharge status: Home / Self Care | Payer: Medicare Other | Source: Home / Self Care | Attending: Family Medicine | Admitting: Family Medicine

## 2014-10-23 DIAGNOSIS — L089 Local infection of the skin and subcutaneous tissue, unspecified: Secondary | ICD-10-CM

## 2014-10-23 DIAGNOSIS — T148XXA Other injury of unspecified body region, initial encounter: Secondary | ICD-10-CM

## 2014-10-23 DIAGNOSIS — S80822D Blister (nonthermal), left lower leg, subsequent encounter: Secondary | ICD-10-CM

## 2014-10-23 DIAGNOSIS — L03116 Cellulitis of left lower limb: Secondary | ICD-10-CM | POA: Diagnosis not present

## 2014-10-23 MED ORDER — BACITRACIN 500 UNIT/GM EX OINT
1.0000 "application " | TOPICAL_OINTMENT | Freq: Two times a day (BID) | CUTANEOUS | Status: DC
  Administered 2014-10-23: 1 via TOPICAL

## 2014-10-23 MED ORDER — CLINDAMYCIN HCL 300 MG PO CAPS: 300.0000 mg | ORAL_CAPSULE | Freq: Three times a day (TID) | ORAL | Status: DC

## 2014-10-23 NOTE — ED Notes (Signed)
Applied Bacitracin, ABD pad and Kerlex to LLE.  Wound was clean and dry.  Pt taught how to change the dressing and she stated understanding.

## 2014-10-23 NOTE — Discharge Instructions (Signed)
Cellulitis Cellulitis is an infection of the skin and the tissue under the skin. The infected area is usually red and tender. This happens most often in the arms and lower legs. HOME CARE   Take your antibiotic medicine as told. Finish the medicine even if you start to feel better.  Keep the infected arm or leg raised (elevated).  Put a warm cloth on the area up to 4 times per day.  Only take medicines as told by your doctor.  Keep all doctor visits as told. GET HELP IF:  You see red streaks on the skin coming from the infected area.  Your red area gets bigger or turns a dark color.  Your bone or joint under the infected area is painful after the skin heals.  Your infection comes back in the same area or different area.  You have a puffy (swollen) bump in the infected area.  You have new symptoms.  You have a fever. GET HELP RIGHT AWAY IF:   You feel very sleepy.  You throw up (vomit) or have watery poop (diarrhea).  You feel sick and have muscle aches and pains. MAKE SURE YOU:   Understand these instructions.  Will watch your condition.  Will get help right away if you are not doing well or get worse. Document Released: 10/14/2007 Document Revised: 09/11/2013 Document Reviewed: 07/13/2011 [facility] Patient Information 2015 [place], [place]. This information is not intended to replace advice given to you by your health care provider. Make sure you discuss any questions you have with your health care provider.  Contusion A contusion is a deep bruise. Contusions happen when an injury causes bleeding under the skin. Signs of bruising include pain, puffiness (swelling), and discolored skin. The contusion may turn blue, purple, or yellow. HOME CARE   Put ice on the injured area.  Put ice in a plastic bag.  Place a towel between your skin and the bag.  Leave the ice on for 15-20 minutes, 03-04 times a day.  Only take medicine as told by your doctor.  Rest the  injured area.  If possible, raise (elevate) the injured area to lessen puffiness. GET HELP RIGHT AWAY IF:   You have more bruising or puffiness.  You have pain that is getting worse.  Your puffiness or pain is not helped by medicine. MAKE SURE YOU:   Understand these instructions.  Will watch your condition.  Will get help right away if you are not doing well or get worse. Document Released: 10/14/2007 Document Revised: 07/20/2011 Document Reviewed: 03/02/2011 [facility] Patient Information 2015 [place], [place]. This information is not intended to replace advice given to you by your health care provider. Make sure you discuss any questions you have with your health care provider.  Wound Infection A wound infection happens when a type of germ (bacteria) starts growing in the wound. In some cases, this can cause the wound to break open. If cared for properly, the infected wound will heal from the inside to the outside. Wound infections need treatment. CAUSES An infection is caused by bacteria growing in the wound.  SYMPTOMS   Increase in redness, swelling, or pain at the wound site.  Increase in drainage at the wound site.  Wound or bandage (dressing) starts to smell bad.  Fever.  Feeling tired or fatigued.  Pus draining from the wound. TREATMENT  Your health care provider will prescribe antibiotic medicine. The wound infection should improve within 24 to 48 hours. Any redness around the  wound should stop spreading and the wound should be less painful.  HOME CARE INSTRUCTIONS   Only take over-the-counter or prescription medicines for pain, discomfort, or fever as directed by your health care provider.  Take your antibiotics as directed. Finish them even if you start to feel better.  Gently wash the area with mild soap and water 2 times a day, or as directed. Rinse off the soap. Pat the area dry with a clean towel. Do not rub the wound. This may cause bleeding.  Follow your  health care provider's instructions for how often you need to change the dressing.  Apply ointment and a dressing to the wound as directed.  If the dressing sticks, moisten it with soapy water and gently remove it.  Change the bandage right away if it becomes wet, dirty, or develops a bad smell.  Take showers. Do not take tub baths, swim, or do anything that may soak the wound until it is healed.  Avoid exercises that make you sweat heavily.  Use anti-itch medicine as directed by your health care provider. The wound may itch when it is healing. Do not pick or scratch at the wound.  Follow up with your health care provider to get your wound rechecked as directed. SEEK MEDICAL CARE IF:  You have an increase in swelling, pain, or redness around the wound.  You have an increase in the amount of pus coming from the wound.  There is a bad smell coming from the wound.  More of the wound breaks open.  You have a fever. MAKE SURE YOU:   Understand these instructions.  Will watch your condition.  Will get help right away if you are not doing well or get worse. Document Released: 01/24/2003 Document Revised: 05/02/2013 Document Reviewed: 08/31/2010 [facility] Patient Information 2015 [place], [place]. This information is not intended to replace advice given to you by your health care provider. Make sure you discuss any questions you have with your health care provider.  Wound Care Wound care helps prevent pain and infection.  You may need a tetanus shot if:  You cannot remember when you had your last tetanus shot.  You have never had a tetanus shot.  The injury broke your skin. If you need a tetanus shot and you choose not to have one, you may get tetanus. Sickness from tetanus can be serious. HOME CARE   Only take medicine as told by your doctor.  Clean the wound daily with mild soap and water.  Change any bandages (dressings) as told by your doctor.  Put medicated cream and  a bandage on the wound as told by your doctor.  Change the bandage if it gets wet, dirty, or starts to smell.  Take showers. Do not take baths, swim, or do anything that puts your wound under water.  Rest and raise (elevate) the wound until the pain and puffiness (swelling) are better.  Keep all doctor visits as told. GET HELP RIGHT AWAY IF:   Yellowish-white fluid (pus) comes from the wound.  Medicine does not lessen your pain.  There is a red streak going away from the wound.  You have a fever. MAKE SURE YOU:   Understand these instructions.  Will watch your condition.  Will get help right away if you are not doing well or get worse. Document Released: 02/04/2008 Document Revised: 07/20/2011 Document Reviewed: 08/31/2010 [facility] Patient Information 2015 [place], [place]. This information is not intended to replace advice given to you by  your health care provider. Make sure you discuss any questions you have with your health care provider. ° °
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
## 2014-10-25 ENCOUNTER — Emergency Department (INDEPENDENT_AMBULATORY_CARE_PROVIDER_SITE_OTHER)
Admission: EM | Admit: 2014-10-25 | Discharge: 2014-10-25 | Disposition: A | Discharge status: Home / Self Care | Payer: Medicare Other | Source: Home / Self Care | Attending: Family Medicine | Admitting: Family Medicine

## 2014-10-25 ENCOUNTER — Encounter (HOSPITAL_COMMUNITY): Payer: Self-pay | Admitting: Emergency Medicine

## 2014-10-25 DIAGNOSIS — S81802A Unspecified open wound, left lower leg, initial encounter: Secondary | ICD-10-CM | POA: Diagnosis not present
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
## 2014-10-25 NOTE — ED Notes (Signed)
Here for left leg wound check [place]14 Was here on Tuesday States she is not in no pain Uses heat as tx

## 2014-10-25 NOTE — Discharge Instructions (Signed)
Thank you for coming in today. Follow up with Dr. Alain Marion.  Follow up with the wound doctors.  Return as needed.  Continue antibiotics.  Skin Ulcer A skin ulcer is an open sore that can be shallow or deep. Skin ulcers sometimes become infected and are difficult to treat. It may be 1 month or longer before real healing progress is made. CAUSES   Injury.  Problems with the veins or arteries.  Diabetes.  Insect bites.  Bedsores.  Inflammatory conditions. SYMPTOMS   Pain, redness, swelling, and tenderness around the ulcer.  Fever.  Bleeding from the ulcer.  Yellow or clear fluid coming from the ulcer. DIAGNOSIS  There are many types of skin ulcers. Any open sores will be examined. Certain tests will be done to determine the kind of ulcer you have. The right treatment depends on the type of ulcer you have. TREATMENT  Treatment is a long-term challenge. It may include:  Wearing an elastic wrap, compression stockings, or gel cast over the ulcer area.  Taking antibiotic medicines or putting antibiotic creams on the affected area if there is an infection. HOME CARE INSTRUCTIONS  Put on your bandages (dressings), wraps, or casts over the ulcer as directed by your caregiver.  Change all dressings as directed by your caregiver.  Take all medicines as directed by your caregiver.  Keep the affected area clean and dry.  Avoid injuries to the affected area.  Eat a well-balanced, healthy diet that includes plenty of fruit and vegetables.  If you smoke, consider quitting or decreasing the amount of cigarettes you smoke.  Once the ulcer heals, get regular exercise as directed by your caregiver.  Work with your caregiver to make sure your blood pressure, cholesterol, and diabetes are well-controlled.  Keep your skin moisturized. Dry skin can crack and lead to skin ulcers. SEEK IMMEDIATE MEDICAL CARE IF:   Your pain gets worse.  You have swelling, redness, or fluids around  the ulcer.  You have chills.  You have a fever. MAKE SURE YOU:   Understand these instructions.  Will watch your condition.  Will get help right away if you are not doing well or get worse. Document Released: 06/04/2004 Document Revised: 07/20/2011 Document Reviewed: 12/12/2010 [facility] Patient Information 2015 [place], [place]. This information is not intended to replace advice given to you by your health care provider. Make sure you discuss any questions you have with your health care provider.

## 2014-11-01 ENCOUNTER — Encounter: Payer: Self-pay | Admitting: Internal Medicine

## 2014-11-01 ENCOUNTER — Ambulatory Visit (INDEPENDENT_AMBULATORY_CARE_PROVIDER_SITE_OTHER): Payer: Medicare Other | Admitting: Internal Medicine

## 2014-11-01 VITALS — BP 130/74 | HR 72 | Temp 98.5°F | Wt 147.0 lb

## 2014-11-01 DIAGNOSIS — Z23 Encounter for immunization: Secondary | ICD-10-CM

## 2014-11-01 DIAGNOSIS — R609 Edema, unspecified: Secondary | ICD-10-CM

## 2014-11-01 DIAGNOSIS — I10 Essential (primary) hypertension: Secondary | ICD-10-CM | POA: Diagnosis not present

## 2014-11-01 DIAGNOSIS — S81802A Unspecified open wound, left lower leg, initial encounter: Secondary | ICD-10-CM | POA: Insufficient documentation

## 2014-11-01 DIAGNOSIS — S81802D Unspecified open wound, left lower leg, subsequent encounter: Secondary | ICD-10-CM | POA: Diagnosis not present

## 2014-11-01 MED ORDER — SPIRONOLACTONE 50 MG PO TABS: 50.0000 mg | ORAL_TABLET | Freq: Every day | ORAL | Status: DC
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
## 2014-11-01 NOTE — Progress Notes (Signed)
   Subjective:    HPI C/o large L dist shin wound - x 2 weeks Finishing Clinda po  The patient presents for a follow-up of  chronic hypertension, chronic dyslipidemia, type 2 diabetes controlled with medicines.   Wt Readings from Last 3 Encounters:  11/01/14 147 lb (66.679 kg)  09/03/14 151 lb (68.493 kg)  05/07/14 148 lb (67.132 kg)     BP Readings from Last 3 Encounters:  11/01/14 130/74  10/25/14 159/66  10/23/14 133/72     Review of Systems  Constitutional: Negative for chills, activity change, appetite change, fatigue and unexpected weight change.  HENT: Negative for congestion, mouth sores and sinus pressure.   Eyes: Negative for visual disturbance.  Respiratory: Negative for cough, chest tightness and wheezing.   Cardiovascular: Negative for chest pain and leg swelling.  Gastrointestinal: Negative for nausea and abdominal pain.  Genitourinary: Negative for frequency, difficulty urinating and vaginal pain.  Musculoskeletal: Negative for back pain and gait problem.  Skin: Negative for pallor and rash.  Neurological: Negative for dizziness, tremors, weakness, numbness and headaches.  Psychiatric/Behavioral: Negative for suicidal ideas, confusion and sleep disturbance.       Objective:   Physical Exam  Constitutional: She appears well-developed and well-nourished. No distress.  HENT:  Head: Normocephalic.  Right Ear: External ear normal.  Left Ear: External ear normal.  Nose: Nose normal.  Mouth/Throat: Oropharynx is clear and moist.  Eyes: Conjunctivae are normal. Pupils are equal, round, and reactive to light. Right eye exhibits no discharge. Left eye exhibits no discharge.  Neck: Normal range of motion. Neck supple. No JVD present. No tracheal deviation present. No thyromegaly present.  Cardiovascular: Normal rate, regular rhythm and normal heart sounds.   Pulmonary/Chest: No stridor. No respiratory distress. She has no wheezes.  Abdominal: Soft. Bowel sounds  are normal. She exhibits no distension and no mass. There is no tenderness. There is no rebound and no guarding.  Musculoskeletal: She exhibits no edema or tenderness.  Lymphadenopathy:    She has no cervical adenopathy.  Neurological: She displays normal reflexes. No cranial nerve deficit. She exhibits normal muscle tone. Coordination normal.  Skin: No rash noted. No erythema.  Psychiatric: She has a normal mood and affect. Her behavior is normal. Judgment and thought content normal.  LS NT No wax B Trace edema B feet L>R   large L dist shin wound 9x7x1 cm w/granulating base - clean  Lab Results  Component Value Date   WBC 6.3 01/24/2014   HGB 12.7 01/24/2014   HCT 37.5 01/24/2014   PLT 164.0 01/24/2014   GLUCOSE 130* 09/03/2014   CHOL 171 09/25/2013   TRIG 126.0 09/25/2013   HDL 44.40 09/25/2013   LDLDIRECT 78.9 10/22/2008   LDLCALC 101* 09/25/2013   ALT 24 01/24/2014   AST 27 01/24/2014   NA 141 09/03/2014   K 4.5 09/03/2014   CL 107 09/03/2014   CREATININE 1.38* 09/03/2014   BUN 14 09/03/2014   CO2 27 09/03/2014   TSH 1.29 01/24/2014   INR 1.03 08/16/2011   HGBA1C 6.0 09/03/2014   MICROALBUR 0.6 12/25/2010       Assessment & Plan:

## 2014-11-01 NOTE — Progress Notes (Signed)
Pre visit review using our clinic review tool, if applicable. No additional management support is needed unless otherwise documented below in the visit note. 

## 2014-11-01 NOTE — Assessment & Plan Note (Signed)
Elevate leg Spironolactone

## 2014-11-01 NOTE — Assessment & Plan Note (Signed)
On Coreg, Spironolactone 

## 2014-11-02 ENCOUNTER — Telehealth: Payer: Self-pay | Admitting: Internal Medicine

## 2014-11-02 NOTE — Telephone Encounter (Signed)
Noted. Thx.

## 2014-11-02 NOTE — Telephone Encounter (Signed)
Called wound care spoke with Dorothea Ogle they do not have any earlier appointments . Will call pt if they have a cancellation.

## 2014-11-05 ENCOUNTER — Encounter (HOSPITAL_BASED_OUTPATIENT_CLINIC_OR_DEPARTMENT_OTHER): Payer: Medicare Other | Attending: General Surgery

## 2014-11-05 DIAGNOSIS — Z885 Allergy status to narcotic agent status: Secondary | ICD-10-CM | POA: Diagnosis not present

## 2014-11-05 DIAGNOSIS — L97222 Non-pressure chronic ulcer of left calf with fat layer exposed: Secondary | ICD-10-CM | POA: Diagnosis not present

## 2014-11-05 DIAGNOSIS — X58XXXD Exposure to other specified factors, subsequent encounter: Secondary | ICD-10-CM | POA: Insufficient documentation

## 2014-11-05 DIAGNOSIS — S8012XD Contusion of left lower leg, subsequent encounter: Secondary | ICD-10-CM | POA: Insufficient documentation

## 2014-11-05 DIAGNOSIS — D573 Sickle-cell trait: Secondary | ICD-10-CM | POA: Insufficient documentation

## 2014-11-05 DIAGNOSIS — E11622 Type 2 diabetes mellitus with other skin ulcer: Secondary | ICD-10-CM | POA: Insufficient documentation

## 2014-11-05 DIAGNOSIS — I10 Essential (primary) hypertension: Secondary | ICD-10-CM | POA: Insufficient documentation

## 2014-11-05 DIAGNOSIS — Z888 Allergy status to other drugs, medicaments and biological substances status: Secondary | ICD-10-CM | POA: Diagnosis not present

## 2014-11-20 ENCOUNTER — Encounter (HOSPITAL_BASED_OUTPATIENT_CLINIC_OR_DEPARTMENT_OTHER): Payer: Medicare Other | Attending: General Surgery

## 2014-11-20 DIAGNOSIS — D571 Sickle-cell disease without crisis: Secondary | ICD-10-CM | POA: Diagnosis not present

## 2014-11-20 DIAGNOSIS — I10 Essential (primary) hypertension: Secondary | ICD-10-CM | POA: Diagnosis not present

## 2014-11-20 DIAGNOSIS — L97222 Non-pressure chronic ulcer of left calf with fat layer exposed: Secondary | ICD-10-CM | POA: Insufficient documentation

## 2014-11-20 DIAGNOSIS — E08622 Diabetes mellitus due to underlying condition with other skin ulcer: Secondary | ICD-10-CM | POA: Insufficient documentation

## 2014-11-20 DIAGNOSIS — S8012XD Contusion of left lower leg, subsequent encounter: Secondary | ICD-10-CM | POA: Insufficient documentation

## 2014-11-20 DIAGNOSIS — X58XXXD Exposure to other specified factors, subsequent encounter: Secondary | ICD-10-CM | POA: Insufficient documentation

## 2014-11-27 ENCOUNTER — Encounter (HOSPITAL_BASED_OUTPATIENT_CLINIC_OR_DEPARTMENT_OTHER): Payer: Self-pay

## 2014-12-04 DIAGNOSIS — S8012XD Contusion of left lower leg, subsequent encounter: Secondary | ICD-10-CM | POA: Diagnosis not present

## 2014-12-04 DIAGNOSIS — I10 Essential (primary) hypertension: Secondary | ICD-10-CM | POA: Diagnosis not present

## 2014-12-04 DIAGNOSIS — L97222 Non-pressure chronic ulcer of left calf with fat layer exposed: Secondary | ICD-10-CM | POA: Diagnosis not present

## 2014-12-04 DIAGNOSIS — D571 Sickle-cell disease without crisis: Secondary | ICD-10-CM | POA: Diagnosis not present

## 2014-12-04 DIAGNOSIS — E08622 Diabetes mellitus due to underlying condition with other skin ulcer: Secondary | ICD-10-CM | POA: Diagnosis not present

## 2014-12-08 ENCOUNTER — Other Ambulatory Visit: Payer: Self-pay | Admitting: Internal Medicine

## 2014-12-11 ENCOUNTER — Ambulatory Visit: Payer: Medicare Other | Admitting: Internal Medicine

## 2014-12-17 ENCOUNTER — Other Ambulatory Visit (INDEPENDENT_AMBULATORY_CARE_PROVIDER_SITE_OTHER): Payer: Medicare Other

## 2014-12-17 DIAGNOSIS — E119 Type 2 diabetes mellitus without complications: Secondary | ICD-10-CM

## 2014-12-17 DIAGNOSIS — I10 Essential (primary) hypertension: Secondary | ICD-10-CM

## 2014-12-17 DIAGNOSIS — Z0000 Encounter for general adult medical examination without abnormal findings: Secondary | ICD-10-CM

## 2014-12-17 DIAGNOSIS — N259 Disorder resulting from impaired renal tubular function, unspecified: Secondary | ICD-10-CM | POA: Diagnosis not present

## 2014-12-17 LAB — LIPID PANEL
CHOL/HDL RATIO: 4
Cholesterol: 160 mg/dL (ref 0–200)
HDL: 37 mg/dL — AB (ref >39.00)
LDL Cholesterol: 92 mg/dL (ref 0–99)
NONHDL: 123.14
Triglycerides: 158 mg/dL — ABNORMAL HIGH (ref 0.0–149.0)
VLDL: 31.6 mg/dL (ref 0.0–40.0)

## 2014-12-17 LAB — BASIC METABOLIC PANEL
BUN: 19 mg/dL (ref 6–23)
CALCIUM: 9.8 mg/dL (ref 8.4–10.5)
CO2: 25 meq/L (ref 19–32)
CREATININE: 1.46 mg/dL — AB (ref 0.40–1.20)
Chloride: 108 mEq/L (ref 96–112)
GFR: 44.92 mL/min — AB (ref >60.00)
Glucose, Bld: 146 mg/dL — ABNORMAL HIGH (ref 70–99)
Potassium: 4.3 mEq/L (ref 3.5–5.1)
Sodium: 142 mEq/L (ref 135–145)

## 2014-12-17 LAB — HEPATIC FUNCTION PANEL
ALBUMIN: 4 g/dL (ref 3.5–5.2)
ALK PHOS: 68 U/L (ref 39–117)
ALT: 19 U/L (ref 0–35)
AST: 18 U/L (ref 0–37)
BILIRUBIN DIRECT: 0.2 mg/dL (ref 0.0–0.3)
Total Bilirubin: 1.1 mg/dL (ref 0.2–1.2)
Total Protein: 6.8 g/dL (ref 6.0–8.3)

## 2014-12-17 LAB — CBC WITH DIFFERENTIAL/PLATELET
BASOS ABS: 0 10*3/uL (ref 0.0–0.1)
BASOS PCT: 0.4 % (ref 0.0–3.0)
EOS PCT: 2.6 % (ref 0.0–5.0)
Eosinophils Absolute: 0.2 10*3/uL (ref 0.0–0.7)
HCT: 38.3 % (ref 36.0–46.0)
HEMOGLOBIN: 12.7 g/dL (ref 12.0–15.0)
Lymphocytes Relative: 28.6 % (ref 12.0–46.0)
Lymphs Abs: 1.7 10*3/uL (ref 0.7–4.0)
MCHC: 33.3 g/dL (ref 30.0–36.0)
MCV: 92.7 fl (ref 78.0–100.0)
Monocytes Absolute: 0.5 10*3/uL (ref 0.1–1.0)
Monocytes Relative: 7.7 % (ref 3.0–12.0)
Neutro Abs: 3.6 10*3/uL (ref 1.4–7.7)
Neutrophils Relative %: 60.7 % (ref 43.0–77.0)
Platelets: 156 10*3/uL (ref 150.0–400.0)
RBC: 4.13 Mil/uL (ref 3.87–5.11)
RDW: 14 % (ref 11.5–15.5)
WBC: 6 10*3/uL (ref 4.0–10.5)

## 2014-12-17 LAB — TSH: TSH: 1.33 u[IU]/mL (ref 0.35–4.50)

## 2014-12-17 LAB — HEMOGLOBIN A1C: Hgb A1c MFr Bld: 5.7 % (ref 4.6–6.5)

## 2014-12-18 ENCOUNTER — Encounter (HOSPITAL_BASED_OUTPATIENT_CLINIC_OR_DEPARTMENT_OTHER): Payer: Medicare Other | Attending: General Surgery

## 2014-12-18 ENCOUNTER — Ambulatory Visit (INDEPENDENT_AMBULATORY_CARE_PROVIDER_SITE_OTHER): Payer: Medicare Other | Admitting: Internal Medicine

## 2014-12-18 ENCOUNTER — Encounter: Payer: Self-pay | Admitting: Internal Medicine

## 2014-12-18 VITALS — BP 130/75 | HR 71 | Wt 144.0 lb

## 2014-12-18 DIAGNOSIS — D571 Sickle-cell disease without crisis: Secondary | ICD-10-CM | POA: Diagnosis not present

## 2014-12-18 DIAGNOSIS — I10 Essential (primary) hypertension: Secondary | ICD-10-CM

## 2014-12-18 DIAGNOSIS — R609 Edema, unspecified: Secondary | ICD-10-CM

## 2014-12-18 DIAGNOSIS — S8012XD Contusion of left lower leg, subsequent encounter: Secondary | ICD-10-CM | POA: Insufficient documentation

## 2014-12-18 DIAGNOSIS — E119 Type 2 diabetes mellitus without complications: Secondary | ICD-10-CM | POA: Diagnosis not present

## 2014-12-18 DIAGNOSIS — L97222 Non-pressure chronic ulcer of left calf with fat layer exposed: Secondary | ICD-10-CM | POA: Diagnosis present

## 2014-12-18 DIAGNOSIS — S81802D Unspecified open wound, left lower leg, subsequent encounter: Secondary | ICD-10-CM

## 2014-12-18 DIAGNOSIS — X58XXXD Exposure to other specified factors, subsequent encounter: Secondary | ICD-10-CM | POA: Insufficient documentation

## 2014-12-18 DIAGNOSIS — E11622 Type 2 diabetes mellitus with other skin ulcer: Secondary | ICD-10-CM | POA: Diagnosis not present

## 2014-12-18 MED ORDER — VITAMIN D 1000 UNITS PO TABS: 1000.0000 [IU] | ORAL_TABLET | Freq: Every day | ORAL | Status: AC

## 2014-12-18 NOTE — Assessment & Plan Note (Signed)
Chronic - taking Glimeperide

## 2014-12-18 NOTE — Progress Notes (Signed)
Pre visit review using our clinic review tool, if applicable. No additional management support is needed unless otherwise documented below in the visit note. 

## 2014-12-18 NOTE — Assessment & Plan Note (Signed)
On Coreg, Spironolactone 

## 2014-12-18 NOTE — Progress Notes (Signed)
Subjective:  Patient ID: Brandy Gonzales, female    DOB: 1941/01/15  Age: 74 y.o. MRN: [place]:[id number]  CC: No chief complaint on file.   HPI VERNEL COUTCHER presents for GERD, DM, HTN f/u  Outpatient Prescriptions Prior to Visit  Medication Sig Dispense Refill  . aspirin 81 MG EC tablet Take 81 mg by mouth daily.      . carvedilol (COREG) 25 MG tablet TAKE ONE TABLET BY MOUTH TWICE DAILY WITH  A  MEAL 60 tablet 11  . glimepiride (AMARYL) 2 MG tablet TAKE ONE TABLET BY MOUTH BEFORE BREAKFAST - for diabetes 30 tablet 11  . omeprazole (PRILOSEC) 20 MG capsule Take 1 capsule (20 mg total) by mouth 2 (two) times daily before a meal. For indigestion/acid 60 capsule 11  . spironolactone (ALDACTONE) 50 MG tablet Take 1 tablet (50 mg total) by mouth daily. For high blood pressure 30 tablet 11  . triamcinolone cream (KENALOG) 0.5 % Apply 1 application topically 3 (three) times daily. 30 g 1  . carvedilol (COREG) 25 MG tablet Take 1 tablet (25 mg total) by mouth 2 (two) times daily with a meal. For high blood pressure 60 tablet 11  . clindamycin (CLEOCIN) 300 MG capsule Take 1 capsule (300 mg total) by mouth 3 (three) times daily. 30 capsule 0   No facility-administered medications prior to visit.    ROS Review of Systems  Constitutional: Negative for chills, activity change, appetite change, fatigue and unexpected weight change.  HENT: Negative for congestion, mouth sores and sinus pressure.   Eyes: Negative for visual disturbance.  Respiratory: Negative for cough and chest tightness.   Gastrointestinal: Negative for nausea and abdominal pain.  Genitourinary: Negative for frequency, difficulty urinating and vaginal pain.  Musculoskeletal: Negative for back pain and gait problem.  Skin: Negative for pallor and rash.  Neurological: Negative for dizziness, tremors, weakness, numbness and headaches.  Psychiatric/Behavioral: Negative for confusion and sleep disturbance.    Objective:  BP 130/75 mmHg   Pulse 71  Wt 144 lb (65.318 kg)  SpO2 97%  BP Readings from Last 3 Encounters:  12/18/14 130/75  11/01/14 130/74  10/25/14 159/66    Wt Readings from Last 3 Encounters:  12/18/14 144 lb (65.318 kg)  11/01/14 147 lb (66.679 kg)  09/03/14 151 lb (68.493 kg)    Physical Exam  Constitutional: She appears well-developed. No distress.  HENT:  Head: Normocephalic.  Right Ear: External ear normal.  Left Ear: External ear normal.  Nose: Nose normal.  Mouth/Throat: Oropharynx is clear and moist.  Eyes: Conjunctivae are normal. Pupils are equal, round, and reactive to light. Right eye exhibits no discharge. Left eye exhibits no discharge.  Neck: Normal range of motion. Neck supple. No JVD present. No tracheal deviation present. No thyromegaly present.  Cardiovascular: Normal rate, regular rhythm and normal heart sounds.   Pulmonary/Chest: No stridor. No respiratory distress. She has no wheezes.  Abdominal: Soft. Bowel sounds are normal. She exhibits no distension and no mass. There is no tenderness. There is no rebound and no guarding.  Musculoskeletal: She exhibits no edema or tenderness.  Lymphadenopathy:    She has no cervical adenopathy.  Neurological: She displays normal reflexes. No cranial nerve deficit. She exhibits normal muscle tone. Coordination normal.  Skin: No rash noted. No erythema.  Psychiatric: She has a normal mood and affect. Her behavior is normal. Judgment and thought content normal.  LLE wound is dressed LLE edema 1+  Lab Results  Component  Value Date   WBC 6.0 12/17/2014   HGB 12.7 12/17/2014   HCT 38.3 12/17/2014   PLT 156.0 12/17/2014   GLUCOSE 146* 12/17/2014   CHOL 160 12/17/2014   TRIG 158.0* 12/17/2014   HDL 37.00* 12/17/2014   LDLDIRECT 78.9 10/22/2008   LDLCALC 92 12/17/2014   ALT 19 12/17/2014   AST 18 12/17/2014   NA 142 12/17/2014   K 4.3 12/17/2014   CL 108 12/17/2014   CREATININE 1.46* 12/17/2014   BUN 19 12/17/2014   CO2 25  12/17/2014   TSH 1.33 12/17/2014   INR 1.03 08/16/2011   HGBA1C 5.7 12/17/2014   MICROALBUR 0.6 12/25/2010    No results found.  Assessment & Plan:   Diagnoses and all orders for this visit:  Essential hypertension  Diabetes type 2, controlled  Edema  Leg wound, left, subsequent encounter  Other orders -     cholecalciferol (VITAMIN D) 1000 UNITS tablet; Take 1 tablet (1,000 Units total) by mouth daily.  I have discontinued Ms. Heyde's clindamycin. I am also having her start on cholecalciferol. Additionally, I am having her maintain her aspirin, triamcinolone cream, glimepiride, omeprazole, spironolactone, and carvedilol.  Meds ordered this encounter  Medications  . cholecalciferol (VITAMIN D) 1000 UNITS tablet    Sig: Take 1 tablet (1,000 Units total) by mouth daily.    Dispense:  100 tablet    Refill:  3     Follow-up: Return in about 3 months (around 03/20/2015) for a follow-up visit.  Walker Kehr, MD

## 2014-12-18 NOTE — Assessment & Plan Note (Signed)
6/16  large L dist shin wound - post-traumatic

## 2014-12-19 ENCOUNTER — Telehealth: Payer: Self-pay | Admitting: Geriatric Medicine

## 2014-12-19 NOTE — Telephone Encounter (Signed)
Left message asking patient to call me back to let me know if she has had a mammogram recently.  

## 2015-01-09 DIAGNOSIS — D571 Sickle-cell disease without crisis: Secondary | ICD-10-CM | POA: Diagnosis not present

## 2015-01-09 DIAGNOSIS — S8012XD Contusion of left lower leg, subsequent encounter: Secondary | ICD-10-CM | POA: Diagnosis not present

## 2015-01-09 DIAGNOSIS — L97222 Non-pressure chronic ulcer of left calf with fat layer exposed: Secondary | ICD-10-CM | POA: Diagnosis not present

## 2015-01-09 DIAGNOSIS — E11622 Type 2 diabetes mellitus with other skin ulcer: Secondary | ICD-10-CM | POA: Diagnosis not present

## 2015-01-09 DIAGNOSIS — I10 Essential (primary) hypertension: Secondary | ICD-10-CM | POA: Diagnosis not present

## 2015-01-15 ENCOUNTER — Encounter (HOSPITAL_BASED_OUTPATIENT_CLINIC_OR_DEPARTMENT_OTHER): Payer: Medicare Other | Attending: General Surgery

## 2015-01-15 DIAGNOSIS — E11622 Type 2 diabetes mellitus with other skin ulcer: Secondary | ICD-10-CM | POA: Diagnosis not present

## 2015-01-15 DIAGNOSIS — L97222 Non-pressure chronic ulcer of left calf with fat layer exposed: Secondary | ICD-10-CM | POA: Insufficient documentation

## 2015-01-15 DIAGNOSIS — I10 Essential (primary) hypertension: Secondary | ICD-10-CM | POA: Diagnosis not present

## 2015-01-15 DIAGNOSIS — D571 Sickle-cell disease without crisis: Secondary | ICD-10-CM | POA: Insufficient documentation

## 2015-01-22 DIAGNOSIS — E11622 Type 2 diabetes mellitus with other skin ulcer: Secondary | ICD-10-CM | POA: Diagnosis not present

## 2015-01-22 DIAGNOSIS — I10 Essential (primary) hypertension: Secondary | ICD-10-CM | POA: Diagnosis not present

## 2015-01-22 DIAGNOSIS — L97222 Non-pressure chronic ulcer of left calf with fat layer exposed: Secondary | ICD-10-CM | POA: Diagnosis not present

## 2015-01-22 DIAGNOSIS — D571 Sickle-cell disease without crisis: Secondary | ICD-10-CM | POA: Diagnosis not present

## 2015-01-29 DIAGNOSIS — I10 Essential (primary) hypertension: Secondary | ICD-10-CM | POA: Diagnosis not present

## 2015-01-29 DIAGNOSIS — L97222 Non-pressure chronic ulcer of left calf with fat layer exposed: Secondary | ICD-10-CM | POA: Diagnosis not present

## 2015-01-29 DIAGNOSIS — D571 Sickle-cell disease without crisis: Secondary | ICD-10-CM | POA: Diagnosis not present

## 2015-01-29 DIAGNOSIS — E11622 Type 2 diabetes mellitus with other skin ulcer: Secondary | ICD-10-CM | POA: Diagnosis not present

## 2015-02-05 DIAGNOSIS — D571 Sickle-cell disease without crisis: Secondary | ICD-10-CM | POA: Diagnosis not present

## 2015-02-05 DIAGNOSIS — E11622 Type 2 diabetes mellitus with other skin ulcer: Secondary | ICD-10-CM | POA: Diagnosis not present

## 2015-02-05 DIAGNOSIS — L97222 Non-pressure chronic ulcer of left calf with fat layer exposed: Secondary | ICD-10-CM | POA: Diagnosis not present

## 2015-02-05 DIAGNOSIS — I10 Essential (primary) hypertension: Secondary | ICD-10-CM | POA: Diagnosis not present

## 2015-03-20 ENCOUNTER — Other Ambulatory Visit (INDEPENDENT_AMBULATORY_CARE_PROVIDER_SITE_OTHER): Payer: Medicare Other

## 2015-03-20 ENCOUNTER — Encounter: Payer: Self-pay | Admitting: Internal Medicine

## 2015-03-20 ENCOUNTER — Ambulatory Visit (INDEPENDENT_AMBULATORY_CARE_PROVIDER_SITE_OTHER): Payer: Medicare Other | Admitting: Internal Medicine

## 2015-03-20 VITALS — BP 124/60 | HR 76 | Wt 147.0 lb

## 2015-03-20 DIAGNOSIS — E119 Type 2 diabetes mellitus without complications: Secondary | ICD-10-CM

## 2015-03-20 DIAGNOSIS — M1730 Unilateral post-traumatic osteoarthritis, unspecified knee: Secondary | ICD-10-CM

## 2015-03-20 DIAGNOSIS — N259 Disorder resulting from impaired renal tubular function, unspecified: Secondary | ICD-10-CM | POA: Diagnosis not present

## 2015-03-20 DIAGNOSIS — I10 Essential (primary) hypertension: Secondary | ICD-10-CM | POA: Diagnosis not present

## 2015-03-20 DIAGNOSIS — R609 Edema, unspecified: Secondary | ICD-10-CM | POA: Diagnosis not present

## 2015-03-20 LAB — BASIC METABOLIC PANEL
BUN: 27 mg/dL — AB (ref 6–23)
CHLORIDE: 107 meq/L (ref 96–112)
CO2: 25 mEq/L (ref 19–32)
CREATININE: 1.52 mg/dL — AB (ref 0.40–1.20)
Calcium: 10.3 mg/dL (ref 8.4–10.5)
GFR: 42.85 mL/min — ABNORMAL LOW (ref >60.00)
GLUCOSE: 158 mg/dL — AB (ref 70–99)
Potassium: 5.2 mEq/L — ABNORMAL HIGH (ref 3.5–5.1)
Sodium: 140 mEq/L (ref 135–145)

## 2015-03-20 LAB — HEMOGLOBIN A1C: Hgb A1c MFr Bld: 6.4 % (ref 4.6–6.5)

## 2015-03-20 NOTE — Assessment & Plan Note (Addendum)
Chronic - taking Glimeperide Labs

## 2015-03-20 NOTE — Assessment & Plan Note (Signed)
On Coreg, Spironolactone 

## 2015-03-20 NOTE — Assessment & Plan Note (Signed)
Tylenol prn 

## 2015-03-20 NOTE — Assessment & Plan Note (Signed)
Resolved Spironolactone

## 2015-03-20 NOTE — Progress Notes (Signed)
Pre visit review using our clinic review tool, if applicable. No additional management support is needed unless otherwise documented below in the visit note. 

## 2015-03-20 NOTE — Progress Notes (Signed)
Subjective:  Patient ID: Brandy Gonzales, female    DOB: 09/19/1940  Age: 74 y.o. MRN: [place]:[id number]  CC: No chief complaint on file.   HPI JAYDYN KAEO presents for HTN, OA, DM f/u  Outpatient Prescriptions Prior to Visit  Medication Sig Dispense Refill  . aspirin 81 MG EC tablet Take 81 mg by mouth daily.      . carvedilol (COREG) 25 MG tablet TAKE ONE TABLET BY MOUTH TWICE DAILY WITH  A  MEAL 60 tablet 11  . cholecalciferol (VITAMIN D) 1000 UNITS tablet Take 1 tablet (1,000 Units total) by mouth daily. 100 tablet 3  . glimepiride (AMARYL) 2 MG tablet TAKE ONE TABLET BY MOUTH BEFORE BREAKFAST - for diabetes 30 tablet 11  . omeprazole (PRILOSEC) 20 MG capsule Take 1 capsule (20 mg total) by mouth 2 (two) times daily before a meal. For indigestion/acid 60 capsule 11  . spironolactone (ALDACTONE) 50 MG tablet Take 1 tablet (50 mg total) by mouth daily. For high blood pressure 30 tablet 11  . triamcinolone cream (KENALOG) 0.5 % Apply 1 application topically 3 (three) times daily. 30 g 1   No facility-administered medications prior to visit.    ROS Review of Systems  Constitutional: Negative for chills, activity change, appetite change, fatigue and unexpected weight change.  HENT: Negative for congestion, mouth sores and sinus pressure.   Eyes: Negative for visual disturbance.  Respiratory: Negative for cough and chest tightness.   Gastrointestinal: Negative for nausea and abdominal pain.  Genitourinary: Negative for frequency, difficulty urinating and vaginal pain.  Musculoskeletal: Positive for arthralgias and gait problem. Negative for back pain.  Skin: Negative for pallor and rash.  Neurological: Negative for dizziness, tremors, weakness, numbness and headaches.  Psychiatric/Behavioral: Negative for confusion and sleep disturbance.    Objective:  BP 124/60 mmHg  Pulse 76  Wt 147 lb (66.679 kg)  SpO2 97%  BP Readings from Last 3 Encounters:  03/20/15 124/60  12/18/14 130/75    11/01/14 130/74    Wt Readings from Last 3 Encounters:  03/20/15 147 lb (66.679 kg)  12/18/14 144 lb (65.318 kg)  11/01/14 147 lb (66.679 kg)    Physical Exam  Constitutional: She appears well-developed. No distress.  HENT:  Head: Normocephalic.  Right Ear: External ear normal.  Left Ear: External ear normal.  Nose: Nose normal.  Mouth/Throat: Oropharynx is clear and moist.  Eyes: Conjunctivae are normal. Pupils are equal, round, and reactive to light. Right eye exhibits no discharge. Left eye exhibits no discharge.  Neck: Normal range of motion. Neck supple. No JVD present. No tracheal deviation present. No thyromegaly present.  Cardiovascular: Normal rate, regular rhythm and normal heart sounds.   Pulmonary/Chest: No stridor. No respiratory distress. She has no wheezes.  Abdominal: Soft. Bowel sounds are normal. She exhibits no distension and no mass. There is no tenderness. There is no rebound and no guarding.  Musculoskeletal: She exhibits no edema or tenderness.  Lymphadenopathy:    She has no cervical adenopathy.  Neurological: She displays normal reflexes. No cranial nerve deficit. She exhibits normal muscle tone. Coordination normal.  Skin: No rash noted. No erythema.  Psychiatric: She has a normal mood and affect. Her behavior is normal. Judgment and thought content normal.    Lab Results  Component Value Date   WBC 6.0 12/17/2014   HGB 12.7 12/17/2014   HCT 38.3 12/17/2014   PLT 156.0 12/17/2014   GLUCOSE 158* 03/20/2015   CHOL 160 12/17/2014  TRIG 158.0* 12/17/2014   HDL 37.00* 12/17/2014   LDLDIRECT 78.9 10/22/2008   LDLCALC 92 12/17/2014   ALT 19 12/17/2014   AST 18 12/17/2014   NA 140 03/20/2015   K 5.2* 03/20/2015   CL 107 03/20/2015   CREATININE 1.52* 03/20/2015   BUN 27* 03/20/2015   CO2 25 03/20/2015   TSH 1.33 12/17/2014   INR 1.03 08/16/2011   HGBA1C 6.4 03/20/2015   MICROALBUR 0.6 12/25/2010    No results found.  Assessment & Plan:    Diagnoses and all orders for this visit:  Essential hypertension  Controlled type 2 diabetes mellitus without complication, without long-term current use of insulin (HCC)  Edema, unspecified type  Post-traumatic osteoarthritis of one knee  I am having Ms. Wahlen maintain her aspirin, triamcinolone cream, glimepiride, omeprazole, spironolactone, carvedilol, and cholecalciferol.  No orders of the defined types were placed in this encounter.     Follow-up: Return in about 4 months (around 07/18/2015) for Wellness Exam.  Walker Kehr, MD

## 2015-04-09 ENCOUNTER — Other Ambulatory Visit: Payer: Self-pay | Admitting: Internal Medicine

## 2015-04-19 ENCOUNTER — Encounter (HOSPITAL_COMMUNITY): Payer: Self-pay | Admitting: Emergency Medicine

## 2015-04-19 ENCOUNTER — Emergency Department (HOSPITAL_COMMUNITY)
Admission: EM | Admit: 2015-04-19 | Discharge: 2015-04-20 | Disposition: A | Discharge status: Home / Self Care | Payer: Medicare Other | Attending: Emergency Medicine | Admitting: Emergency Medicine

## 2015-04-19 DIAGNOSIS — R197 Diarrhea, unspecified: Secondary | ICD-10-CM | POA: Diagnosis not present

## 2015-04-19 DIAGNOSIS — R112 Nausea with vomiting, unspecified: Secondary | ICD-10-CM | POA: Insufficient documentation

## 2015-04-19 DIAGNOSIS — Z79899 Other long term (current) drug therapy: Secondary | ICD-10-CM | POA: Insufficient documentation

## 2015-04-19 DIAGNOSIS — Z7982 Long term (current) use of aspirin: Secondary | ICD-10-CM | POA: Diagnosis not present

## 2015-04-19 DIAGNOSIS — Z8742 Personal history of other diseases of the female genital tract: Secondary | ICD-10-CM | POA: Diagnosis not present

## 2015-04-19 DIAGNOSIS — I10 Essential (primary) hypertension: Secondary | ICD-10-CM | POA: Insufficient documentation

## 2015-04-19 DIAGNOSIS — Z8781 Personal history of (healed) traumatic fracture: Secondary | ICD-10-CM | POA: Diagnosis not present

## 2015-04-19 DIAGNOSIS — Z8619 Personal history of other infectious and parasitic diseases: Secondary | ICD-10-CM | POA: Diagnosis not present

## 2015-04-19 DIAGNOSIS — K219 Gastro-esophageal reflux disease without esophagitis: Secondary | ICD-10-CM | POA: Diagnosis not present

## 2015-04-19 DIAGNOSIS — E119 Type 2 diabetes mellitus without complications: Secondary | ICD-10-CM | POA: Diagnosis not present

## 2015-04-19 DIAGNOSIS — R05 Cough: Secondary | ICD-10-CM | POA: Diagnosis not present

## 2015-04-19 DIAGNOSIS — R55 Syncope and collapse: Secondary | ICD-10-CM | POA: Diagnosis not present

## 2015-04-19 DIAGNOSIS — Z87828 Personal history of other (healed) physical injury and trauma: Secondary | ICD-10-CM | POA: Insufficient documentation

## 2015-04-19 LAB — COMPREHENSIVE METABOLIC PANEL
ALT: 19 U/L (ref 14–54)
AST: 33 U/L (ref 15–41)
Albumin: 3.6 g/dL (ref 3.5–5.0)
Alkaline Phosphatase: 67 U/L (ref 38–126)
Anion gap: 8 (ref 5–15)
BUN: 18 mg/dL (ref 6–20)
CHLORIDE: 108 mmol/L (ref 101–111)
CO2: 22 mmol/L (ref 22–32)
Calcium: 9.5 mg/dL (ref 8.9–10.3)
Creatinine, Ser: 1.45 mg/dL — ABNORMAL HIGH (ref 0.44–1.00)
GFR calc non Af Amer: 34 mL/min — ABNORMAL LOW (ref >60)
GFR, EST AFRICAN AMERICAN: 40 mL/min — AB (ref >60)
Glucose, Bld: 143 mg/dL — ABNORMAL HIGH (ref 65–99)
Potassium: 4.6 mmol/L (ref 3.5–5.1)
SODIUM: 138 mmol/L (ref 135–145)
Total Bilirubin: 1.6 mg/dL — ABNORMAL HIGH (ref 0.3–1.2)
Total Protein: 6.1 g/dL — ABNORMAL LOW (ref 6.5–8.1)

## 2015-04-19 LAB — CBC
HCT: 35.1 % — ABNORMAL LOW (ref 36.0–46.0)
HEMOGLOBIN: 11.7 g/dL — AB (ref 12.0–15.0)
MCH: 30.5 pg (ref 26.0–34.0)
MCHC: 33.3 g/dL (ref 30.0–36.0)
MCV: 91.4 fL (ref 78.0–100.0)
Platelets: 140 10*3/uL — ABNORMAL LOW (ref 150–400)
RBC: 3.84 MIL/uL — AB (ref 3.87–5.11)
RDW: 13.5 % (ref 11.5–15.5)
WBC: 6.7 10*3/uL (ref 4.0–10.5)

## 2015-04-19 LAB — LIPASE, BLOOD: LIPASE: 22 U/L (ref 11–51)

## 2015-04-19 MED ORDER — ONDANSETRON HCL 4 MG/2ML IJ SOLN
4.0000 mg | Freq: Once | INTRAMUSCULAR | Status: AC | PRN
  Administered 2015-04-19: 4 mg via INTRAVENOUS
  Filled 2015-04-19: qty 2

## 2015-04-19 NOTE — ED Notes (Addendum)
Pt roomed wrong.

## 2015-04-19 NOTE — ED Notes (Signed)
Presents with nausea, acti e vomiting and diarrhea began this morning, vomited all evening, diarrhea 3-4 times today. Pt has moist mucous membranes and brisk cap refill. Alert, drowsy, denies pain. Reports her grandchild was sick with same.

## 2015-04-20 ENCOUNTER — Emergency Department (HOSPITAL_COMMUNITY): Payer: Medicare Other

## 2015-04-20 DIAGNOSIS — R05 Cough: Secondary | ICD-10-CM | POA: Diagnosis not present

## 2015-04-20 LAB — URINALYSIS, ROUTINE W REFLEX MICROSCOPIC
Bilirubin Urine: NEGATIVE
Glucose, UA: NEGATIVE mg/dL
Hgb urine dipstick: NEGATIVE
Ketones, ur: 15 mg/dL — AB
NITRITE: NEGATIVE
PROTEIN: NEGATIVE mg/dL
SPECIFIC GRAVITY, URINE: 1.011 (ref 1.005–1.030)
pH: 5.5 (ref 5.0–8.0)

## 2015-04-20 LAB — URINE MICROSCOPIC-ADD ON: RBC / HPF: NONE SEEN RBC/hpf (ref 0–5)

## 2015-04-20 MED ORDER — ONDANSETRON HCL 4 MG PO TABS: 4.0000 mg | ORAL_TABLET | Freq: Four times a day (QID) | ORAL | Status: DC

## 2015-04-20 NOTE — ED Notes (Signed)
Ambulated pt, pt was dizzy upon standing but after taking a few steps she stated she felt fine, pt walked with minimal assistance.

## 2015-04-20 NOTE — ED Notes (Signed)
Pt taken to xray 

## 2015-04-20 NOTE — Discharge Instructions (Signed)
Nausea and Vomiting °Nausea is a sick feeling that often comes before throwing up (vomiting). Vomiting is a reflex where stomach contents come out of your mouth. Vomiting can cause severe loss of body fluids (dehydration). Children and elderly adults can become dehydrated quickly, especially if they also have diarrhea. Nausea and vomiting are symptoms of a condition or disease. It is important to find the cause of your symptoms. °CAUSES  °· Direct irritation of the stomach lining. This irritation can result from increased acid production (gastroesophageal reflux disease), infection, food poisoning, taking certain medicines (such as nonsteroidal anti-inflammatory drugs), alcohol use, or tobacco use. °· Signals from the brain. These signals could be caused by a headache, heat exposure, an inner ear disturbance, increased pressure in the brain from injury, infection, a tumor, or a concussion, pain, emotional stimulus, or metabolic problems. °· An obstruction in the gastrointestinal tract (bowel obstruction). °· Illnesses such as diabetes, hepatitis, gallbladder problems, appendicitis, kidney problems, cancer, sepsis, atypical symptoms of a heart attack, or eating disorders. °· Medical treatments such as chemotherapy and radiation. °· Receiving medicine that makes you sleep (general anesthetic) during surgery. °DIAGNOSIS °Your caregiver may ask for tests to be done if the problems do not improve after a few days. Tests may also be done if symptoms are severe or if the reason for the nausea and vomiting is not clear. Tests may include: °· Urine tests. °· Blood tests. °· Stool tests. °· Cultures (to look for evidence of infection). °· X-rays or other imaging studies. °Test results can help your caregiver make decisions about treatment or the need for additional tests. °TREATMENT °You need to stay well hydrated. Drink frequently but in small amounts. You may wish to drink water, sports drinks, clear broth, or eat frozen  ice pops or gelatin dessert to help stay hydrated. When you eat, eating slowly may help prevent nausea. There are also some antinausea medicines that may help prevent nausea. °HOME CARE INSTRUCTIONS  °· Take all medicine as directed by your caregiver. °· If you do not have an appetite, do not force yourself to eat. However, you must continue to drink fluids. °· If you have an appetite, eat a normal diet unless your caregiver tells you differently. °¨ Eat a variety of complex carbohydrates (rice, wheat, potatoes, bread), lean meats, yogurt, fruits, and vegetables. °¨ Avoid high-fat foods because they are more difficult to digest. °· Drink enough water and fluids to keep your urine clear or pale yellow. °· If you are dehydrated, ask your caregiver for specific rehydration instructions. Signs of dehydration may include: °¨ Severe thirst. °¨ Dry lips and mouth. °¨ Dizziness. °¨ Dark urine. °¨ Decreasing urine frequency and amount. °¨ Confusion. °¨ Rapid breathing or pulse. °SEEK IMMEDIATE MEDICAL CARE IF:  °· You have blood or brown flecks (like coffee grounds) in your vomit. °· You have black or bloody stools. °· You have a severe headache or stiff neck. °· You are confused. °· You have severe abdominal pain. °· You have chest pain or trouble breathing. °· You do not urinate at least once every 8 hours. °· You develop cold or clammy skin. °· You continue to vomit for longer than 24 to 48 hours. °· You have a fever. °MAKE SURE YOU:  °· Understand these instructions. °· Will watch your condition. °· Will get help right away if you are not doing well or get worse. °  °This information is not intended to replace advice given to you by your health care provider. Make sure   You have a fever.  MAKE SURE YOU:   · Understand these instructions.  · Will watch your condition.  · Will get help right away if you are not doing well or get worse.     This information is not intended to replace advice given to you by your health care provider. Make sure you discuss any questions you have with your health care provider.     Document Released: 04/27/2005 Document Revised: 07/20/2011 Document Reviewed: 09/24/2010  Elsevier Interactive Patient Education ©2016 [employer]      Food Choices to Help Relieve  Diarrhea, Adult  When you have diarrhea, the foods you eat and your eating habits are very important. Choosing the right foods and drinks can help relieve diarrhea. Also, because diarrhea can last up to 7 days, you need to replace lost fluids and electrolytes (such as sodium, potassium, and chloride) in order to help prevent dehydration.   WHAT GENERAL GUIDELINES DO I NEED TO FOLLOW?  · Slowly drink 1 cup (8 oz) of fluid for each episode of diarrhea. If you are getting enough fluid, your urine will be clear or pale yellow.  · Eat starchy foods. Some good choices include white rice, white toast, pasta, low-fiber cereal, baked potatoes (without the skin), saltine crackers, and bagels.  · Avoid large servings of any cooked vegetables.  · Limit fruit to two servings per day. A serving is ½ cup or 1 small piece.  · Choose foods with less than 2 g of fiber per serving.  · Limit fats to less than 8 tsp (38 g) per day.  · Avoid fried foods.  · Eat foods that have probiotics in them. Probiotics can be found in certain dairy products.  · Avoid foods and beverages that may increase the speed at which food moves through the stomach and intestines (gastrointestinal tract). Things to avoid include:  ¨ High-fiber foods, such as dried fruit, raw fruits and vegetables, nuts, seeds, and whole grain foods.  ¨ Spicy foods and high-fat foods.  ¨ Foods and beverages sweetened with high-fructose corn syrup, honey, or sugar alcohols such as xylitol, sorbitol, and mannitol.  WHAT FOODS ARE RECOMMENDED?  Grains  White rice. White, French, or pita breads (fresh or toasted), including plain rolls, buns, or bagels. White pasta. Saltine, soda, or graham crackers. Pretzels. Low-fiber cereal. Cooked cereals made with water (such as cornmeal, farina, or cream cereals). Plain muffins. Matzo. Melba toast. Zwieback.   Vegetables  Potatoes (without the skin). Strained tomato and vegetable juices. Most well-cooked and canned vegetables without seeds.  Tender lettuce.  Fruits  Cooked or canned applesauce, apricots, cherries, fruit cocktail, grapefruit, peaches, pears, or plums. Fresh bananas, apples without skin, cherries, grapes, cantaloupe, grapefruit, peaches, oranges, or plums.   Meat and Other Protein Products  Baked or boiled chicken. Eggs. Tofu. Fish. Seafood. Smooth peanut butter. Ground or well-cooked tender beef, ham, veal, lamb, pork, or poultry.   Dairy  Plain yogurt, kefir, and unsweetened liquid yogurt. Lactose-free milk, buttermilk, or soy milk. Plain hard cheese.  Beverages  Sport drinks. Clear broths. Diluted fruit juices (except prune). Regular, caffeine-free sodas such as ginger ale. Water. Decaffeinated teas. Oral rehydration solutions. Sugar-free beverages not sweetened with sugar alcohols.  Other  Bouillon, broth, or soups made from recommended foods.   The items listed above may not be a complete list of recommended foods or beverages. Contact your dietitian for more options.  WHAT FOODS ARE NOT RECOMMENDED?  Grains  Whole grain, whole   wheat, bran, or rye breads, rolls, pastas, crackers, and cereals. Wild or brown rice. Cereals that contain more than 2 g of fiber per serving. Corn tortillas or taco shells. Cooked or dry oatmeal. Granola. Popcorn.  Vegetables  Raw vegetables. Cabbage, broccoli, Brussels sprouts, artichokes, baked beans, beet greens, corn, kale, legumes, peas, sweet potatoes, and yams. Potato skins. Cooked spinach and cabbage.  Fruits  Dried fruit, including raisins and dates. Raw fruits. Stewed or dried prunes. Fresh apples with skin, apricots, mangoes, pears, raspberries, and strawberries.   Meat and Other Protein Products  Chunky peanut butter. Nuts and seeds. Beans and lentils. Bacon.   Dairy  High-fat cheeses. Milk, chocolate milk, and beverages made with milk, such as milk shakes. Cream. Ice cream.  Sweets and Desserts  Sweet rolls, doughnuts, and sweet breads. Pancakes and waffles.  Fats and Oils  Butter. Cream sauces.  Margarine. Salad oils. Plain salad dressings. Olives. Avocados.   Beverages  Caffeinated beverages (such as coffee, tea, soda, or energy drinks). Alcoholic beverages. Fruit juices with pulp. Prune juice. Soft drinks sweetened with high-fructose corn syrup or sugar alcohols.  Other  Coconut. Hot sauce. Chili powder. Mayonnaise. Gravy. Cream-based or milk-based soups.   The items listed above may not be a complete list of foods and beverages to avoid. Contact your dietitian for more information.  WHAT SHOULD I DO IF I BECOME DEHYDRATED?  Diarrhea can sometimes lead to dehydration. Signs of dehydration include dark urine and dry mouth and skin. If you think you are dehydrated, you should rehydrate with an oral rehydration solution. These solutions can be purchased at pharmacies, retail stores, or online.   Drink ½-1 cup (120-240 mL) of oral rehydration solution each time you have an episode of diarrhea. If drinking this amount makes your diarrhea worse, try drinking smaller amounts more often. For example, drink 1-3 tsp (5-15 mL) every 5-10 minutes.   A general rule for staying hydrated is to drink 1½-2 L of fluid per day. Talk to your health care provider about the specific amount you should be drinking each day. Drink enough fluids to keep your urine clear or pale yellow.     This information is not intended to replace advice given to you by your health care provider. Make sure you discuss any questions you have with your health care provider.     Document Released: 07/18/2003 Document Revised: 05/18/2014 Document Reviewed: 03/20/2013  Elsevier Interactive Patient Education ©2016 [employer]
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
## 2015-04-20 NOTE — ED Provider Notes (Signed)
Medical screening examination/treatment/procedure(s) were conducted as a shared visit with non-physician practitioner(s) and myself.  I personally evaluated the patient during the encounter.   EKG Interpretation   Date/Time:  Saturday April 20 2015 02:50:28 EST Ventricular Rate:  72 PR Interval:  156 QRS Duration: 88 QT Interval:  375 QTC Calculation: 410 R Axis:   1 Text Interpretation:  Sinus rhythm Abnormal R-wave progression, early  transition Left ventricular hypertrophy Borderline T abnormalities,  diffuse leads No significant change since last tracing in 2014 Confirmed  by Makaio Mach,  DO, Karandeep Resende [id number]) on 04/20/2015 3:03:56 AM      Pt is a 74 y.o. female with history of hypertension, diabetes, renal insufficiency who presents emergency department with several episodes of nonbloody diarrhea and one episode of nonbloody, nonbilious vomiting. States that she went to the bathroom and had a syncopal event while on the toilet. She states she is not sure why she passed out. No known seizure activity. No tongue biting. She denies head injury. Denies headache, numbness continuing or focal weakness. Denies chest pain or shortness of breath. Denies abdominal pain. EKG shows no new ischemic changes, arrhythmia, interval changes. Labs unremarkable other than a creatinine of 1.45 which is baseline. Patient does report she has had a cough. No pneumonia seen on chest x-ray. Normal-appearing mediastinum. Abdominal exam is completely benign. She is neurologically intact. Patient able to ambulate and drinking without difficulty. She reports she is feeling better and ready for discharge home. Discussed return precautions. Patient and daughter at bedside verbalize understanding and are comfortable with this plan.  [place], DO 04/20/15 [phone]

## 2015-04-21 LAB — URINE CULTURE

## 2015-04-22 ENCOUNTER — Encounter: Payer: Self-pay | Admitting: Internal Medicine

## 2015-04-22 ENCOUNTER — Ambulatory Visit (INDEPENDENT_AMBULATORY_CARE_PROVIDER_SITE_OTHER): Payer: Medicare Other | Admitting: Internal Medicine

## 2015-04-22 VITALS — BP 150/80 | HR 76 | Wt 146.0 lb

## 2015-04-22 DIAGNOSIS — K529 Noninfective gastroenteritis and colitis, unspecified: Secondary | ICD-10-CM | POA: Diagnosis not present

## 2015-04-22 DIAGNOSIS — N3281 Overactive bladder: Secondary | ICD-10-CM | POA: Insufficient documentation

## 2015-04-22 DIAGNOSIS — I10 Essential (primary) hypertension: Secondary | ICD-10-CM

## 2015-04-22 DIAGNOSIS — E1121 Type 2 diabetes mellitus with diabetic nephropathy: Secondary | ICD-10-CM

## 2015-04-22 DIAGNOSIS — R55 Syncope and collapse: Secondary | ICD-10-CM | POA: Diagnosis not present

## 2015-04-22 HISTORY — DX: Noninfective gastroenteritis and colitis, unspecified: K52.9

## 2015-04-22 LAB — GLUCOSE, POCT (MANUAL RESULT ENTRY): POC Glucose: 94 mg/dl (ref 70–99)

## 2015-04-22 MED ORDER — OXYBUTYNIN CHLORIDE 5 MG PO TABS: 5.0000 mg | ORAL_TABLET | Freq: Three times a day (TID) | ORAL | Status: DC | PRN

## 2015-04-22 NOTE — Assessment & Plan Note (Signed)
Glimepiride - take 1/2 tablet for 1 week, then 1 a day as before

## 2015-04-22 NOTE — Progress Notes (Signed)
Subjective:  Patient ID: Brandy Gonzales, female    DOB: 1940/08/02  Age: 74 y.o. MRN: [id number]  CC: No chief complaint on file.   HPI Brandy Gonzales presents for ER visit for a vaso-vagal syncope on 12/10  f/u: "Pt is a 74 y.o. female with history of hypertension, diabetes, renal insufficiency who presents emergency department with several episodes of nonbloody diarrhea and one episode of nonbloody, nonbilious vomiting. States that she went to the bathroom and had a syncopal event while on the toilet. She states she is not sure why she passed out. No known seizure activity. No tongue biting. She denies head injury. Denies headache, numbness continuing or focal weakness. Denies chest pain or shortness of breath. Denies abdominal pain. EKG shows no new ischemic changes, arrhythmia, interval changes. Labs unremarkable other than a creatinine of 1.45 which is baseline. Patient does report she has had a cough. No pneumonia seen on chest x-ray. Normal-appearing mediastinum. Abdominal exam is completely benign. She is neurologically intact. Patient able to ambulate and drinking without difficulty. She reports she is feeling better and ready for discharge home. Discussed return precautions. Patient and daughter at bedside verbalize understanding and are comfortable with this plan.  [place], DO 04/20/15 [phone]"  C/o urinary sx's x months. F/u DM-2  Outpatient Prescriptions Prior to Visit  Medication Sig Dispense Refill  . aspirin 81 MG EC tablet Take 81 mg by mouth daily.      . carvedilol (COREG) 25 MG tablet TAKE ONE TABLET BY MOUTH TWICE DAILY WITH  A  MEAL 60 tablet 11  . cholecalciferol (VITAMIN D) 1000 UNITS tablet Take 1 tablet (1,000 Units total) by mouth daily. 100 tablet 3  . glimepiride (AMARYL) 2 MG tablet TAKE ONE TABLET BY MOUTH BEFORE BREAKFAST - for diabetes 30 tablet 11  . omeprazole (PRILOSEC) 20 MG capsule Take 1 capsule (20 mg total) by mouth 2 (two) times daily before a meal.  For indigestion/acid 60 capsule 11  . ondansetron (ZOFRAN) 4 MG tablet Take 1 tablet (4 mg total) by mouth every 6 (six) hours. 12 tablet 0  . spironolactone (ALDACTONE) 50 MG tablet Take 1 tablet (50 mg total) by mouth daily. For high blood pressure 30 tablet 11  . omeprazole (PRILOSEC) 20 MG capsule TAKE ONE CAPSULE BY MOUTH TWICE DAILY BEFORE A MEAL 60 capsule 11   No facility-administered medications prior to visit.    ROS Review of Systems  Constitutional: Negative for chills, activity change, appetite change, fatigue and unexpected weight change.  HENT: Negative for congestion, mouth sores and sinus pressure.   Eyes: Negative for visual disturbance.  Respiratory: Negative for cough and chest tightness.   Cardiovascular: Negative for leg swelling.  Gastrointestinal: Negative for nausea, vomiting, abdominal pain, constipation, blood in stool, abdominal distention and rectal pain.  Genitourinary: Negative for frequency, difficulty urinating and vaginal pain.  Musculoskeletal: Negative for back pain and gait problem.  Skin: Negative for pallor and rash.  Neurological: Negative for dizziness, tremors, seizures, syncope, facial asymmetry, speech difficulty, weakness, light-headedness, numbness and headaches.  Psychiatric/Behavioral: Negative for confusion and sleep disturbance.    Objective:  BP 150/80 mmHg  Pulse 76  Wt 146 lb (66.225 kg)  SpO2 97%  BP Readings from Last 3 Encounters:  04/22/15 150/80  04/20/15 142/76  03/20/15 124/60    Wt Readings from Last 3 Encounters:  04/22/15 146 lb (66.225 kg)  03/20/15 147 lb (66.679 kg)  12/18/14 144 lb (65.318 kg)  Physical Exam  Constitutional: She appears well-developed. No distress.  HENT:  Head: Normocephalic.  Right Ear: External ear normal.  Left Ear: External ear normal.  Nose: Nose normal.  Mouth/Throat: Oropharynx is clear and moist.  Eyes: Conjunctivae are normal. Pupils are equal, round, and reactive to light.  Right eye exhibits no discharge. Left eye exhibits no discharge.  Neck: Normal range of motion. Neck supple. No JVD present. No tracheal deviation present. No thyromegaly present.  Cardiovascular: Normal rate, regular rhythm and normal heart sounds.   Pulmonary/Chest: No stridor. No respiratory distress. She has no wheezes.  Abdominal: Soft. Bowel sounds are normal. She exhibits no distension and no mass. There is no tenderness. There is no rebound and no guarding.  Musculoskeletal: She exhibits no edema or tenderness.  Lymphadenopathy:    She has no cervical adenopathy.  Neurological: She displays normal reflexes. No cranial nerve deficit. She exhibits normal muscle tone. Coordination normal.  Skin: No rash noted. No erythema.  Psychiatric: She has a normal mood and affect. Her behavior is normal. Judgment and thought content normal.  scull, neck - NT  Lab Results  Component Value Date   WBC 6.7 04/19/2015   HGB 11.7* 04/19/2015   HCT 35.1* 04/19/2015   PLT 140* 04/19/2015   GLUCOSE 143* 04/19/2015   CHOL 160 12/17/2014   TRIG 158.0* 12/17/2014   HDL 37.00* 12/17/2014   LDLDIRECT 78.9 10/22/2008   LDLCALC 92 12/17/2014   ALT 19 04/19/2015   AST 33 04/19/2015   NA 138 04/19/2015   K 4.6 04/19/2015   CL 108 04/19/2015   CREATININE 1.45* 04/19/2015   BUN 18 04/19/2015   CO2 22 04/19/2015   TSH 1.33 12/17/2014   INR 1.03 08/16/2011   HGBA1C 6.4 03/20/2015   MICROALBUR 0.6 12/25/2010    Dg Chest 2 View  04/20/2015  CLINICAL DATA:  Cough and syncope.  Nausea and vomiting. EXAM: CHEST  2 VIEW COMPARISON:  10/03/2012 FINDINGS: Heart at the upper limits normal in size with tortuosity of the thoracic aorta. Lungs are hyperinflated with minimal bibasilar atelectasis. Pulmonary vasculature is normal. No consolidation, pleural effusion, or pneumothorax. No acute osseous abnormalities are seen. IMPRESSION: Hyperinflation, may reflect acute bronchitis or COPD. Subsegmental atelectasis at  the lung bases. Electronically Signed   By: Jeb Levering M.D.   On: 04/20/2015 02:01    Assessment & Plan:   Diagnoses and all orders for this visit:  Vasovagal syncope  Gastroenteritis, acute  Controlled type 2 diabetes mellitus with diabetic nephropathy, without long-term current use of insulin (HCC) -     POCT glucose (manual entry)  Overactive bladder  Other orders -     oxybutynin (DITROPAN) 5 MG tablet; Take 1 tablet (5 mg total) by mouth 3 (three) times daily as needed for bladder spasms (urinary frequency).   I am having Ms. Callejo start on oxybutynin. I am also having her maintain her aspirin, glimepiride, omeprazole, spironolactone, carvedilol, cholecalciferol, and ondansetron.  Meds ordered this encounter  Medications  . oxybutynin (DITROPAN) 5 MG tablet    Sig: Take 1 tablet (5 mg total) by mouth 3 (three) times daily as needed for bladder spasms (urinary frequency).    Dispense:  90 tablet    Refill:  3     Follow-up: Return in about 2 months (around 06/23/2015) for a follow-up visit.  Walker Kehr, MD

## 2015-04-22 NOTE — Assessment & Plan Note (Signed)
Will try oxybutinine prn per pt's request

## 2015-04-22 NOTE — Assessment & Plan Note (Signed)
12/16 x1 day - resolved on Friday

## 2015-04-22 NOTE — Assessment & Plan Note (Signed)
12/16 vaso-vagal due to gastroenteritis sx's. ER notes/EKG/labs reviewed Will watch F/u w/Dr Luci Bank

## 2015-04-22 NOTE — Assessment & Plan Note (Signed)
On Coreg, Spironolactone 

## 2015-04-22 NOTE — Progress Notes (Signed)
Pre visit review using our clinic review tool, if applicable. No additional management support is needed unless otherwise documented below in the visit note. 

## 2015-04-22 NOTE — Patient Instructions (Signed)
Glimepiride - take 1/2 tablet for 1 week, then 1 a day as before

## 2015-05-11 ENCOUNTER — Other Ambulatory Visit: Payer: Self-pay | Admitting: Internal Medicine

## 2015-05-23 ENCOUNTER — Emergency Department (INDEPENDENT_AMBULATORY_CARE_PROVIDER_SITE_OTHER)
Admission: EM | Admit: 2015-05-23 | Discharge: 2015-05-23 | Disposition: A | Discharge status: Home / Self Care | Payer: Medicare Other | Source: Home / Self Care | Attending: Family Medicine | Admitting: Family Medicine

## 2015-05-23 ENCOUNTER — Emergency Department (INDEPENDENT_AMBULATORY_CARE_PROVIDER_SITE_OTHER): Payer: Medicare Other

## 2015-05-23 ENCOUNTER — Encounter (HOSPITAL_COMMUNITY): Payer: Self-pay | Admitting: Emergency Medicine

## 2015-05-23 DIAGNOSIS — S46811A Strain of other muscles, fascia and tendons at shoulder and upper arm level, right arm, initial encounter: Secondary | ICD-10-CM | POA: Diagnosis not present

## 2015-05-23 DIAGNOSIS — S86912A Strain of unspecified muscle(s) and tendon(s) at lower leg level, left leg, initial encounter: Secondary | ICD-10-CM

## 2015-05-23 DIAGNOSIS — S86812A Strain of other muscle(s) and tendon(s) at lower leg level, left leg, initial encounter: Secondary | ICD-10-CM

## 2015-05-23 DIAGNOSIS — W1839XA Other fall on same level, initial encounter: Secondary | ICD-10-CM

## 2015-05-23 DIAGNOSIS — S8992XA Unspecified injury of left lower leg, initial encounter: Secondary | ICD-10-CM | POA: Diagnosis not present

## 2015-05-23 DIAGNOSIS — W010XXA Fall on same level from slipping, tripping and stumbling without subsequent striking against object, initial encounter: Secondary | ICD-10-CM

## 2015-05-23 MED ORDER — DICLOFENAC SODIUM 1 % TD GEL: 1.0000 "application " | Freq: Four times a day (QID) | TRANSDERMAL | Status: DC

## 2015-05-23 MED ORDER — TRAMADOL-ACETAMINOPHEN 37.5-325 MG PO TABS: 1.0000 | ORAL_TABLET | Freq: Four times a day (QID) | ORAL | Status: DC | PRN

## 2015-05-23 NOTE — ED Notes (Signed)
Reports falling today at home.  Patient is certain she tripped on a rug at home.  Reports soreness involving right neck and shoulder and left knee.  Walking with a cane since fall today

## 2015-05-23 NOTE — Discharge Instructions (Signed)
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
## 2015-05-24 ENCOUNTER — Telehealth: Payer: Self-pay | Admitting: Internal Medicine

## 2015-05-24 DIAGNOSIS — M25562 Pain in left knee: Secondary | ICD-10-CM

## 2015-05-24 NOTE — Telephone Encounter (Signed)
Done. Thx.

## 2015-05-24 NOTE — Telephone Encounter (Signed)
Pt request referral to go to bone specilist Dr. Jacquelyne Balint, phone # [phone]. Pt went to the ER for fall and left tibial fracture. Please advise.

## 2015-05-27 NOTE — Telephone Encounter (Signed)
Pt aware our PCP will call once we have the appt date and time.

## 2015-06-03 ENCOUNTER — Encounter: Payer: Self-pay | Admitting: Gastroenterology

## 2015-06-10 DIAGNOSIS — M25561 Pain in right knee: Secondary | ICD-10-CM | POA: Diagnosis not present

## 2015-07-08 DIAGNOSIS — M25562 Pain in left knee: Secondary | ICD-10-CM | POA: Diagnosis not present

## 2015-07-13 ENCOUNTER — Emergency Department (INDEPENDENT_AMBULATORY_CARE_PROVIDER_SITE_OTHER)
Admission: EM | Admit: 2015-07-13 | Discharge: 2015-07-13 | Disposition: A | Discharge status: Home / Self Care | Payer: Medicare Other | Source: Home / Self Care | Attending: Emergency Medicine | Admitting: Emergency Medicine

## 2015-07-13 ENCOUNTER — Encounter (HOSPITAL_COMMUNITY): Payer: Self-pay | Admitting: Emergency Medicine

## 2015-07-13 DIAGNOSIS — L03115 Cellulitis of right lower limb: Secondary | ICD-10-CM

## 2015-07-13 LAB — GLUCOSE, CAPILLARY: GLUCOSE-CAPILLARY: 228 mg/dL — AB (ref 65–99)

## 2015-07-13 MED ORDER — AMOXICILLIN-POT CLAVULANATE 875-125 MG PO TABS: 1.0000 | ORAL_TABLET | Freq: Two times a day (BID) | ORAL | Status: DC
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
## 2015-07-13 NOTE — ED Notes (Signed)
Patient's left lower leg dressed with sterile gauze 4 x 4 and wrapped loosely with cling.

## 2015-07-13 NOTE — Discharge Instructions (Signed)

## 2015-07-15 ENCOUNTER — Encounter: Payer: Self-pay | Admitting: Internal Medicine

## 2015-07-19 ENCOUNTER — Ambulatory Visit (INDEPENDENT_AMBULATORY_CARE_PROVIDER_SITE_OTHER): Payer: Medicare Other | Admitting: Nurse Practitioner

## 2015-07-19 ENCOUNTER — Encounter: Payer: Self-pay | Admitting: Nurse Practitioner

## 2015-07-19 VITALS — BP 140/70 | HR 84 | Temp 98.4°F | Ht 59.0 in | Wt 144.0 lb

## 2015-07-19 DIAGNOSIS — S81801S Unspecified open wound, right lower leg, sequela: Secondary | ICD-10-CM | POA: Diagnosis not present

## 2015-07-19 MED ORDER — TRAMADOL-ACETAMINOPHEN 37.5-325 MG PO TABS: 1.0000 | ORAL_TABLET | Freq: Four times a day (QID) | ORAL | Status: DC | PRN

## 2015-07-19 MED ORDER — DOXYCYCLINE HYCLATE 100 MG PO TABS: 100.0000 mg | ORAL_TABLET | Freq: Two times a day (BID) | ORAL | Status: DC
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
## 2015-07-19 NOTE — Patient Instructions (Signed)
STOP the Augmentin  Start the Doxycyline twice daily for 7 days (Sent to your pharmacy)  DO NOT lay down for 30 minutes after taking due to throat soreness   Be careful with the Tramadol- acetaminophen don't take other tylenol since this has it already  Take when not driving and safe at home  We will set you up with the wound care clinic

## 2015-07-26 ENCOUNTER — Encounter (HOSPITAL_BASED_OUTPATIENT_CLINIC_OR_DEPARTMENT_OTHER): Payer: Medicare Other | Attending: Internal Medicine

## 2015-07-26 DIAGNOSIS — L97219 Non-pressure chronic ulcer of right calf with unspecified severity: Secondary | ICD-10-CM | POA: Diagnosis not present

## 2015-07-26 DIAGNOSIS — Z7984 Long term (current) use of oral hypoglycemic drugs: Secondary | ICD-10-CM | POA: Insufficient documentation

## 2015-07-26 DIAGNOSIS — L97211 Non-pressure chronic ulcer of right calf limited to breakdown of skin: Secondary | ICD-10-CM | POA: Diagnosis not present

## 2015-07-26 DIAGNOSIS — L03115 Cellulitis of right lower limb: Secondary | ICD-10-CM | POA: Diagnosis not present

## 2015-07-26 DIAGNOSIS — D571 Sickle-cell disease without crisis: Secondary | ICD-10-CM | POA: Insufficient documentation

## 2015-07-26 DIAGNOSIS — E11622 Type 2 diabetes mellitus with other skin ulcer: Secondary | ICD-10-CM | POA: Diagnosis not present

## 2015-07-26 DIAGNOSIS — E1140 Type 2 diabetes mellitus with diabetic neuropathy, unspecified: Secondary | ICD-10-CM | POA: Insufficient documentation

## 2015-07-26 DIAGNOSIS — E1151 Type 2 diabetes mellitus with diabetic peripheral angiopathy without gangrene: Secondary | ICD-10-CM | POA: Diagnosis not present

## 2015-07-26 DIAGNOSIS — I10 Essential (primary) hypertension: Secondary | ICD-10-CM | POA: Insufficient documentation

## 2015-07-28 IMAGING — RF DG ESOPHAGUS
14 of 24 series · 14 of 24 positions shown · non-contrast
Comparison: None.

FLUOROSCOPY TIME:  1 min and 45 seconds

CLINICAL DATA: Dysphasia.

EXAM:
ESOPHOGRAM / BARIUM SWALLOW / BARIUM TABLET STUDY
TECHNIQUE: Combined double contrast and single contrast examination performed
using effervescent crystals, thick barium liquid, and thin barium
liquid. The patient was observed with fluoroscopy swallowing a 13mm
barium sulphate tablet.

[Series 1: run · 1 of 10 slices shown (1 of 14)]
[im 1/10]
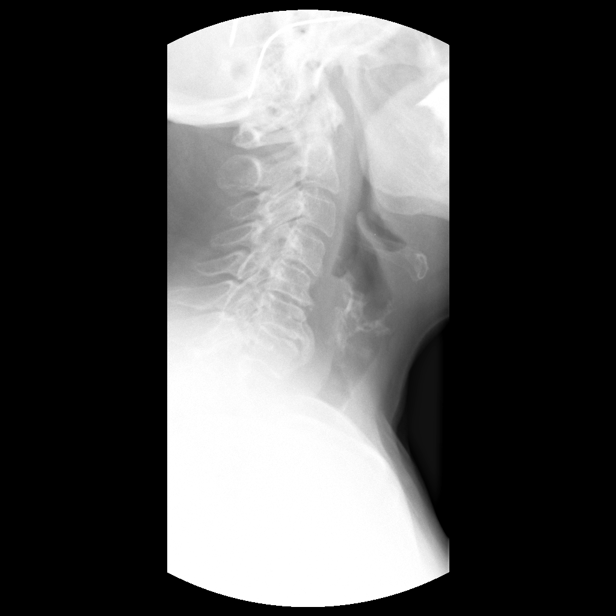

[Series 3: run · 1 of 1 slices shown (2 of 14)]
[im 1/1]
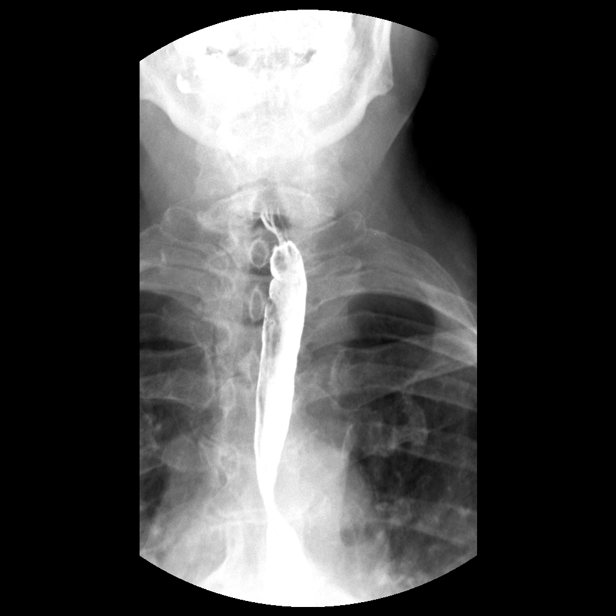

[Series 5: run · 1 of 1 slices shown (3 of 14)]
[im 1/1]
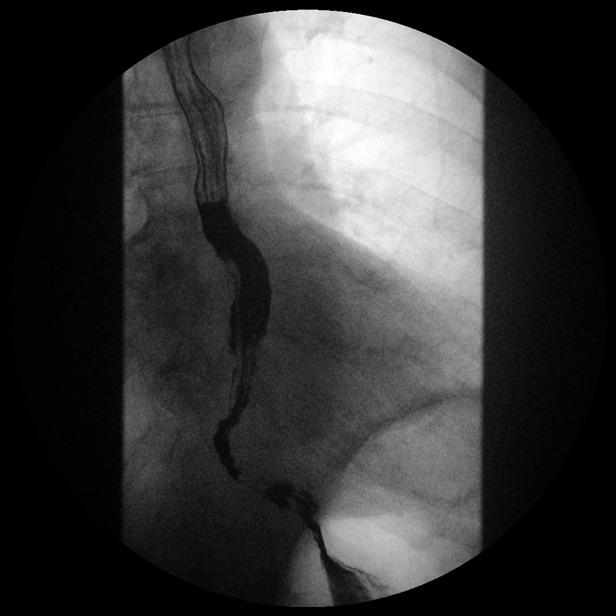

[Series 7: run · 1 of 1 slices shown (4 of 14)]
[im 1/1]
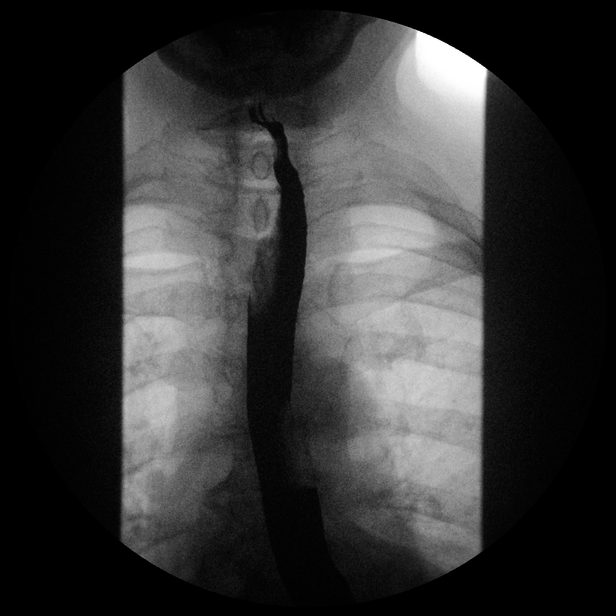

[Series 8: run · 1 of 1 slices shown (5 of 14)]
[im 1/1]
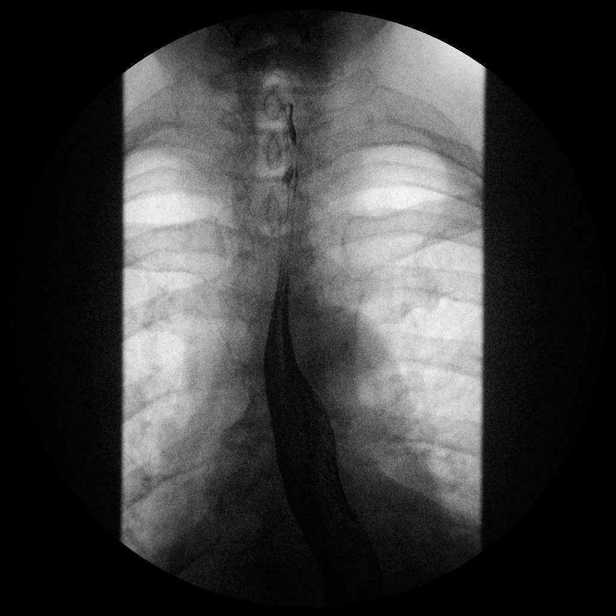

[Series 10: run · 1 of 1 slices shown (6 of 14)]
[im 1/1]
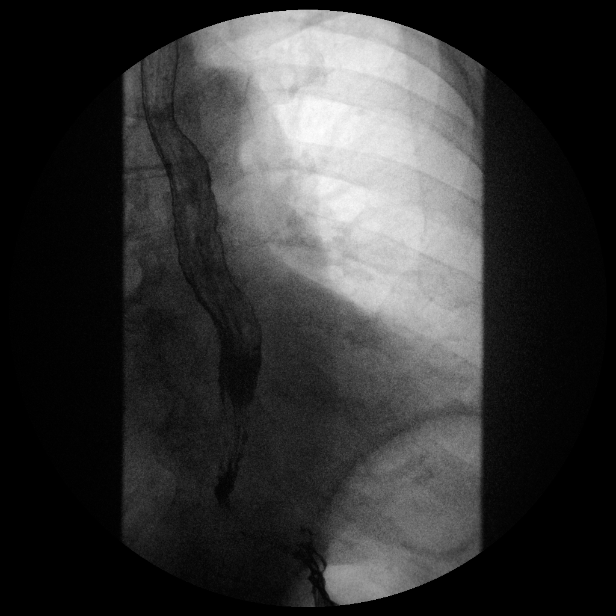

[Series 12: run · 1 of 1 slices shown (7 of 14)]
[im 1/1]
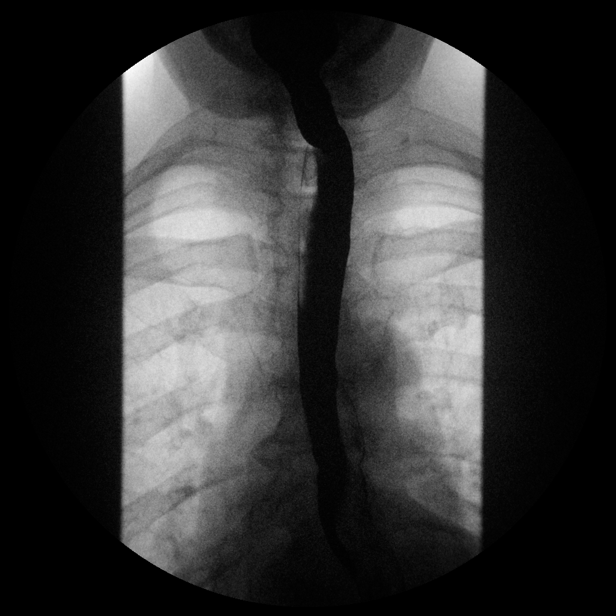

[Series 13: run · 1 of 1 slices shown (8 of 14)]
[im 1/1]
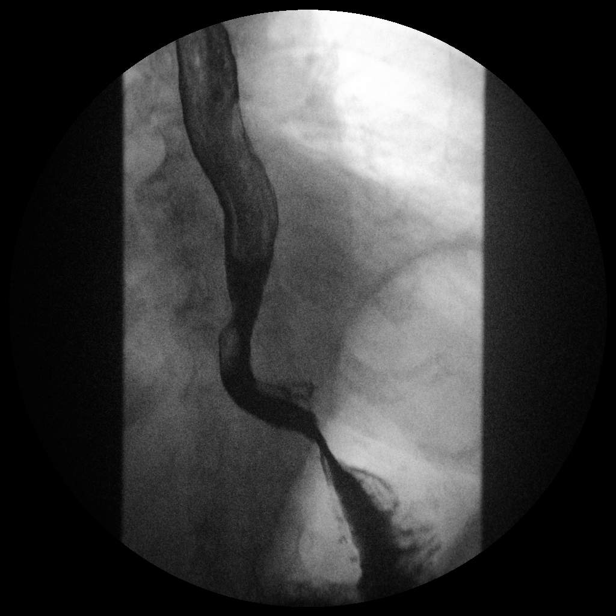

[Series 15: run · 1 of 1 slices shown (9 of 14)]
[im 1/1]
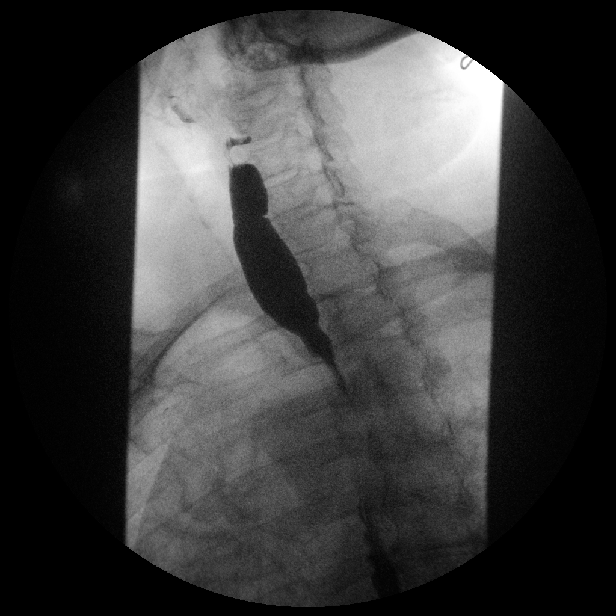

[Series 17: run · 1 of 1 slices shown (10 of 14)]
[im 1/1]
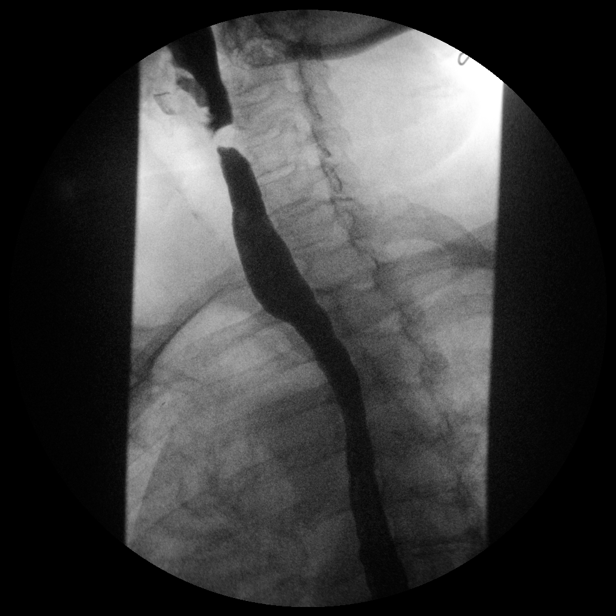

[Series 19: run · 1 of 1 slices shown (11 of 14)]
[im 1/1]
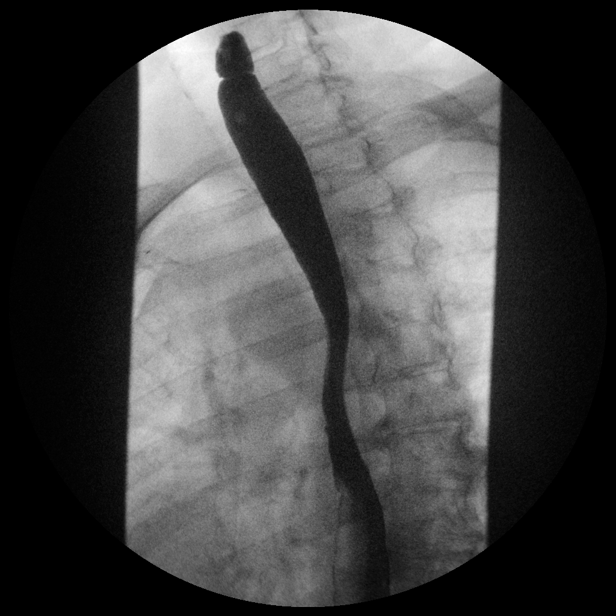

[Series 20: run · 1 of 1 slices shown (12 of 14)]
[im 1/1]
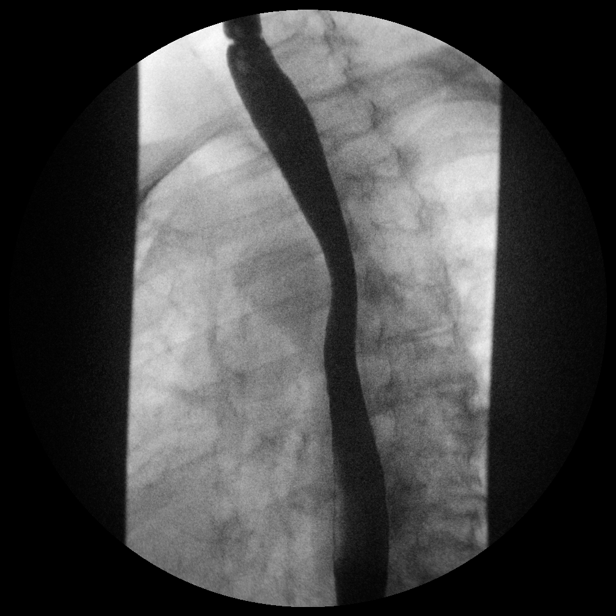

[Series 22: run · 1 of 1 slices shown (13 of 14)]
[im 1/1]
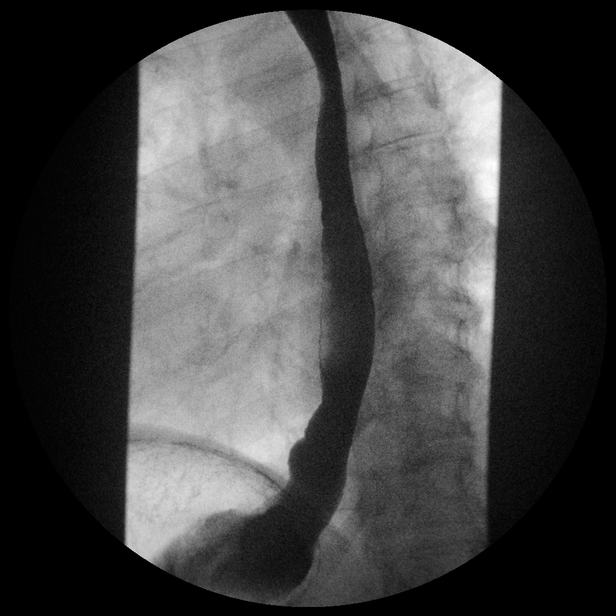

[Series 24: run · 1 of 1 slices shown (14 of 14)]
[im 1/1]
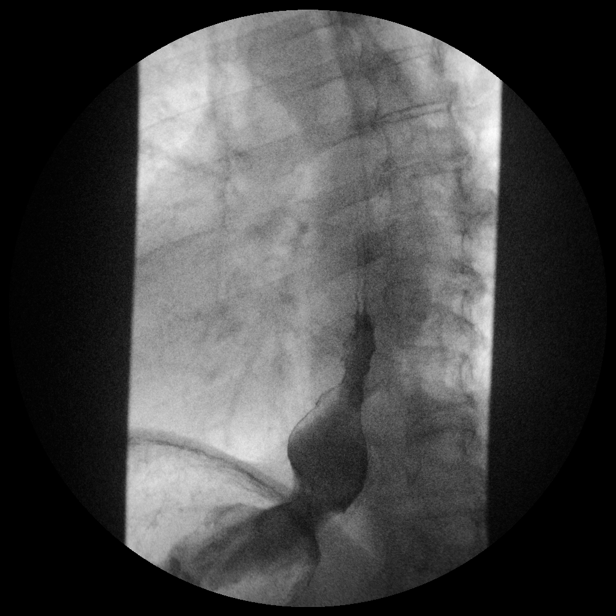

[14 of 24 positions shown; findings below may reference images not displayed]

FINDINGS: The hypopharynx demonstrated no filling defects. Mild
cricopharyngeus muscle impression is noted. The esophagus was smooth
in contour. Tertiary contractions are noted. There was no narrowing
or stricture. There was no delay in the passage of a barium tablet
into the stomach. Small hiatal hernia was noted. There was no
evidence of gastroesophageal reflux with simple provocative
maneuvers.
IMPRESSION: 1.  No evidence of esophageal stricture.

2.  Tertiary contractions of the esophagus.

3.  Small hiatal hernia.

## 2015-07-29 DIAGNOSIS — M25562 Pain in left knee: Secondary | ICD-10-CM | POA: Diagnosis not present

## 2015-08-01 DIAGNOSIS — H26493 Other secondary cataract, bilateral: Secondary | ICD-10-CM | POA: Diagnosis not present

## 2015-08-01 DIAGNOSIS — Z961 Presence of intraocular lens: Secondary | ICD-10-CM | POA: Diagnosis not present

## 2015-08-01 DIAGNOSIS — H35373 Puckering of macula, bilateral: Secondary | ICD-10-CM | POA: Diagnosis not present

## 2015-08-01 DIAGNOSIS — E119 Type 2 diabetes mellitus without complications: Secondary | ICD-10-CM | POA: Diagnosis not present

## 2015-08-01 DIAGNOSIS — H524 Presbyopia: Secondary | ICD-10-CM | POA: Diagnosis not present

## 2015-08-01 DIAGNOSIS — H35033 Hypertensive retinopathy, bilateral: Secondary | ICD-10-CM | POA: Diagnosis not present

## 2015-08-01 DIAGNOSIS — H04123 Dry eye syndrome of bilateral lacrimal glands: Secondary | ICD-10-CM | POA: Diagnosis not present

## 2015-08-01 LAB — HM DIABETES EYE EXAM

## 2015-08-02 DIAGNOSIS — I10 Essential (primary) hypertension: Secondary | ICD-10-CM | POA: Diagnosis not present

## 2015-08-02 DIAGNOSIS — E1140 Type 2 diabetes mellitus with diabetic neuropathy, unspecified: Secondary | ICD-10-CM | POA: Diagnosis not present

## 2015-08-02 DIAGNOSIS — L97211 Non-pressure chronic ulcer of right calf limited to breakdown of skin: Secondary | ICD-10-CM | POA: Diagnosis not present

## 2015-08-02 DIAGNOSIS — E11622 Type 2 diabetes mellitus with other skin ulcer: Secondary | ICD-10-CM | POA: Diagnosis not present

## 2015-08-02 DIAGNOSIS — Z7984 Long term (current) use of oral hypoglycemic drugs: Secondary | ICD-10-CM | POA: Diagnosis not present

## 2015-08-02 DIAGNOSIS — L97219 Non-pressure chronic ulcer of right calf with unspecified severity: Secondary | ICD-10-CM | POA: Diagnosis not present

## 2015-08-02 DIAGNOSIS — E1151 Type 2 diabetes mellitus with diabetic peripheral angiopathy without gangrene: Secondary | ICD-10-CM | POA: Diagnosis not present

## 2015-08-06 ENCOUNTER — Other Ambulatory Visit (INDEPENDENT_AMBULATORY_CARE_PROVIDER_SITE_OTHER): Payer: Medicare Other

## 2015-08-06 DIAGNOSIS — N259 Disorder resulting from impaired renal tubular function, unspecified: Secondary | ICD-10-CM | POA: Diagnosis not present

## 2015-08-06 DIAGNOSIS — E119 Type 2 diabetes mellitus without complications: Secondary | ICD-10-CM | POA: Diagnosis not present

## 2015-08-06 LAB — BASIC METABOLIC PANEL
BUN: 27 mg/dL — ABNORMAL HIGH (ref 6–23)
CALCIUM: 10 mg/dL (ref 8.4–10.5)
CO2: 26 mEq/L (ref 19–32)
Chloride: 111 mEq/L (ref 96–112)
Creatinine, Ser: 1.41 mg/dL — ABNORMAL HIGH (ref 0.40–1.20)
GFR: 46.68 mL/min — AB (ref >60.00)
Glucose, Bld: 183 mg/dL — ABNORMAL HIGH (ref 70–99)
POTASSIUM: 4.4 meq/L (ref 3.5–5.1)
SODIUM: 143 meq/L (ref 135–145)

## 2015-08-06 LAB — HEMOGLOBIN A1C: HEMOGLOBIN A1C: 6.2 % (ref 4.6–6.5)

## 2015-08-08 ENCOUNTER — Other Ambulatory Visit (INDEPENDENT_AMBULATORY_CARE_PROVIDER_SITE_OTHER): Payer: Medicare Other

## 2015-08-08 ENCOUNTER — Encounter: Payer: Self-pay | Admitting: Internal Medicine

## 2015-08-08 ENCOUNTER — Ambulatory Visit (INDEPENDENT_AMBULATORY_CARE_PROVIDER_SITE_OTHER): Payer: Medicare Other | Admitting: Internal Medicine

## 2015-08-08 VITALS — BP 120/64 | HR 72 | Ht 59.0 in | Wt 141.0 lb

## 2015-08-08 DIAGNOSIS — E785 Hyperlipidemia, unspecified: Secondary | ICD-10-CM

## 2015-08-08 DIAGNOSIS — I10 Essential (primary) hypertension: Secondary | ICD-10-CM

## 2015-08-08 DIAGNOSIS — Z0000 Encounter for general adult medical examination without abnormal findings: Secondary | ICD-10-CM | POA: Diagnosis not present

## 2015-08-08 DIAGNOSIS — S81801D Unspecified open wound, right lower leg, subsequent encounter: Secondary | ICD-10-CM | POA: Diagnosis not present

## 2015-08-08 DIAGNOSIS — S81801A Unspecified open wound, right lower leg, initial encounter: Secondary | ICD-10-CM | POA: Insufficient documentation

## 2015-08-08 DIAGNOSIS — D649 Anemia, unspecified: Secondary | ICD-10-CM

## 2015-08-08 DIAGNOSIS — E1121 Type 2 diabetes mellitus with diabetic nephropathy: Secondary | ICD-10-CM

## 2015-08-08 DIAGNOSIS — S81802S Unspecified open wound, left lower leg, sequela: Secondary | ICD-10-CM

## 2015-08-08 LAB — URINALYSIS, ROUTINE W REFLEX MICROSCOPIC
BILIRUBIN URINE: NEGATIVE
Hgb urine dipstick: NEGATIVE
KETONES UR: NEGATIVE
Nitrite: NEGATIVE
PH: 5.5 (ref 5.0–8.0)
RBC / HPF: NONE SEEN (ref 0–?)
Specific Gravity, Urine: 1.01 (ref 1.000–1.030)
TOTAL PROTEIN, URINE-UPE24: NEGATIVE
URINE GLUCOSE: NEGATIVE
UROBILINOGEN UA: 0.2 (ref 0.0–1.0)

## 2015-08-08 LAB — MICROALBUMIN / CREATININE URINE RATIO
CREATININE, U: 106.7 mg/dL
Microalb Creat Ratio: 0.7 mg/g (ref 0.0–30.0)
Microalb, Ur: 0.7 mg/dL (ref 0.0–1.9)

## 2015-08-08 NOTE — Patient Instructions (Signed)
Preventive Care for Adults, Female A healthy lifestyle and preventive care can promote health and wellness. Preventive health guidelines for women include the following key practices.  A routine yearly physical is a good way to check with your health care provider about your health and preventive screening. It is a chance to share any concerns and updates on your health and to receive a thorough exam.  Visit your dentist for a routine exam and preventive care every 6 months. Brush your teeth twice a day and floss once a day. Good oral hygiene prevents tooth decay and gum disease.  The frequency of eye exams is based on your age, health, family medical history, use of contact lenses, and other factors. Follow your health care provider's recommendations for frequency of eye exams.  Eat a healthy diet. Foods like vegetables, fruits, whole grains, low-fat dairy products, and lean protein foods contain the nutrients you need without too many calories. Decrease your intake of foods high in solid fats, added sugars, and salt. Eat the right amount of calories for you.Get information about a proper diet from your health care provider, if necessary.  Regular physical exercise is one of the most important things you can do for your health. Most adults should get at least 150 minutes of moderate-intensity exercise (any activity that increases your heart rate and causes you to sweat) each week. In addition, most adults need muscle-strengthening exercises on 2 or more days a week.  Maintain a healthy weight. The body mass index (BMI) is a screening tool to identify possible weight problems. It provides an estimate of body fat based on height and weight. Your health care provider can find your BMI and can help you achieve or maintain a healthy weight.For adults 20 years and older:  A BMI below 18.5 is considered underweight.  A BMI of 18.5 to 24.9 is normal.  A BMI of 25 to 29.9 is considered overweight.  A  BMI of 30 and above is considered obese.  Maintain normal blood lipids and cholesterol levels by exercising and minimizing your intake of saturated fat. Eat a balanced diet with plenty of fruit and vegetables. Blood tests for lipids and cholesterol should begin at age 45 and be repeated every 5 years. If your lipid or cholesterol levels are high, you are over 50, or you are at high risk for heart disease, you may need your cholesterol levels checked more frequently.Ongoing high lipid and cholesterol levels should be treated with medicines if diet and exercise are not working.  If you smoke, find out from your health care provider how to quit. If you do not use tobacco, do not start.  Lung cancer screening is recommended for adults aged 45-80 years who are at high risk for developing lung cancer because of a history of smoking. A yearly low-dose CT scan of the lungs is recommended for people who have at least a 30-pack-year history of smoking and are a current smoker or have quit within the past 15 years. A pack year of smoking is smoking an average of 1 pack of cigarettes a day for 1 year (for example: 1 pack a day for 30 years or 2 packs a day for 15 years). Yearly screening should continue until the smoker has stopped smoking for at least 15 years. Yearly screening should be stopped for people who develop a health problem that would prevent them from having lung cancer treatment.  If you are pregnant, do not drink alcohol. If you are  breastfeeding, be very cautious about drinking alcohol. If you are not pregnant and choose to drink alcohol, do not have more than 1 drink per day. One drink is considered to be 12 ounces (355 mL) of beer, 5 ounces (148 mL) of wine, or 1.5 ounces (44 mL) of liquor.  Avoid use of street drugs. Do not share needles with anyone. Ask for help if you need support or instructions about stopping the use of drugs.  High blood pressure causes heart disease and increases the risk  of stroke. Your blood pressure should be checked at least every 1 to 2 years. Ongoing high blood pressure should be treated with medicines if weight loss and exercise do not work.  If you are 55-79 years old, ask your health care provider if you should take aspirin to prevent strokes.  Diabetes screening is done by taking a blood sample to check your blood glucose level after you have not eaten for a certain period of time (fasting). If you are not overweight and you do not have risk factors for diabetes, you should be screened once every 3 years starting at age 45. If you are overweight or obese and you are 40-70 years of age, you should be screened for diabetes every year as part of your cardiovascular risk assessment.  Breast cancer screening is essential preventive care for women. You should practice "breast self-awareness." This means understanding the normal appearance and feel of your breasts and may include breast self-examination. Any changes detected, no matter how small, should be reported to a health care provider. Women in their 20s and 30s should have a clinical breast exam (CBE) by a health care provider as part of a regular health exam every 1 to 3 years. After age 40, women should have a CBE every year. Starting at age 40, women should consider having a mammogram (breast X-ray test) every year. Women who have a family history of breast cancer should talk to their health care provider about genetic screening. Women at a high risk of breast cancer should talk to their health care providers about having an MRI and a mammogram every year.  Breast cancer gene (BRCA)-related cancer risk assessment is recommended for women who have family members with BRCA-related cancers. BRCA-related cancers include breast, ovarian, tubal, and peritoneal cancers. Having family members with these cancers may be associated with an increased risk for harmful changes (mutations) in the breast cancer genes BRCA1 and  BRCA2. Results of the assessment will determine the need for genetic counseling and BRCA1 and BRCA2 testing.  Your health care provider may recommend that you be screened regularly for cancer of the pelvic organs (ovaries, uterus, and vagina). This screening involves a pelvic examination, including checking for microscopic changes to the surface of your cervix (Pap test). You may be encouraged to have this screening done every 3 years, beginning at age 21.  For women ages 30-65, health care providers may recommend pelvic exams and Pap testing every 3 years, or they may recommend the Pap and pelvic exam, combined with testing for human papilloma virus (HPV), every 5 years. Some types of HPV increase your risk of cervical cancer. Testing for HPV may also be done on women of any age with unclear Pap test results.  Other health care providers may not recommend any screening for nonpregnant women who are considered low risk for pelvic cancer and who do not have symptoms. Ask your health care provider if a screening pelvic exam is right for   you.  If you have had past treatment for cervical cancer or a condition that could lead to cancer, you need Pap tests and screening for cancer for at least 20 years after your treatment. If Pap tests have been discontinued, your risk factors (such as having a new sexual partner) need to be reassessed to determine if screening should resume. Some women have medical problems that increase the chance of getting cervical cancer. In these cases, your health care provider may recommend more frequent screening and Pap tests.  Colorectal cancer can be detected and often prevented. Most routine colorectal cancer screening begins at the age of 50 years and continues through age 75 years. However, your health care provider may recommend screening at an earlier age if you have risk factors for colon cancer. On a yearly basis, your health care provider may provide home test kits to check  for hidden blood in the stool. Use of a small camera at the end of a tube, to directly examine the colon (sigmoidoscopy or colonoscopy), can detect the earliest forms of colorectal cancer. Talk to your health care provider about this at age 50, when routine screening begins. Direct exam of the colon should be repeated every 5-10 years through age 75 years, unless early forms of precancerous polyps or small growths are found.  People who are at an increased risk for hepatitis B should be screened for this virus. You are considered at high risk for hepatitis B if:  You were born in a country where hepatitis B occurs often. Talk with your health care provider about which countries are considered high risk.  Your parents were born in a high-risk country and you have not received a shot to protect against hepatitis B (hepatitis B vaccine).  You have HIV or AIDS.  You use needles to inject street drugs.  You live with, or have sex with, someone who has hepatitis B.  You get hemodialysis treatment.  You take certain medicines for conditions like cancer, organ transplantation, and autoimmune conditions.  Hepatitis C blood testing is recommended for all people born from 1945 through 1965 and any individual with known risks for hepatitis C.  Practice safe sex. Use condoms and avoid high-risk sexual practices to reduce the spread of sexually transmitted infections (STIs). STIs include gonorrhea, chlamydia, syphilis, trichomonas, herpes, HPV, and human immunodeficiency virus (HIV). Herpes, HIV, and HPV are viral illnesses that have no cure. They can result in disability, cancer, and death.  You should be screened for sexually transmitted illnesses (STIs) including gonorrhea and chlamydia if:  You are sexually active and are younger than 24 years.  You are older than 24 years and your health care provider tells you that you are at risk for this type of infection.  Your sexual activity has changed  since you were last screened and you are at an increased risk for chlamydia or gonorrhea. Ask your health care provider if you are at risk.  If you are at risk of being infected with HIV, it is recommended that you take a prescription medicine daily to prevent HIV infection. This is called preexposure prophylaxis (PrEP). You are considered at risk if:  You are sexually active and do not regularly use condoms or know the HIV status of your partner(s).  You take drugs by injection.  You are sexually active with a partner who has HIV.  Talk with your health care provider about whether you are at high risk of being infected with HIV. If   you choose to begin PrEP, you should first be tested for HIV. You should then be tested every 3 months for as long as you are taking PrEP.  Osteoporosis is a disease in which the bones lose minerals and strength with aging. This can result in serious bone fractures or breaks. The risk of osteoporosis can be identified using a bone density scan. Women ages 67 years and over and women at risk for fractures or osteoporosis should discuss screening with their health care providers. Ask your health care provider whether you should take a calcium supplement or vitamin D to reduce the rate of osteoporosis.  Menopause can be associated with physical symptoms and risks. Hormone replacement therapy is available to decrease symptoms and risks. You should talk to your health care provider about whether hormone replacement therapy is right for you.  Use sunscreen. Apply sunscreen liberally and repeatedly throughout the day. You should seek shade when your shadow is shorter than you. Protect yourself by wearing long sleeves, pants, a wide-brimmed hat, and sunglasses year round, whenever you are outdoors.  Once a month, do a whole body skin exam, using a mirror to look at the skin on your back. Tell your health care provider of new moles, moles that have irregular borders, moles that  are larger than a pencil eraser, or moles that have changed in shape or color.  Stay current with required vaccines (immunizations).  Influenza vaccine. All adults should be immunized every year.  Tetanus, diphtheria, and acellular pertussis (Td, Tdap) vaccine. Pregnant women should receive 1 dose of Tdap vaccine during each pregnancy. The dose should be obtained regardless of the length of time since the last dose. Immunization is preferred during the 27th-36th week of gestation. An adult who has not previously received Tdap or who does not know her vaccine status should receive 1 dose of Tdap. This initial dose should be followed by tetanus and diphtheria toxoids (Td) booster doses every 10 years. Adults with an unknown or incomplete history of completing a 3-dose immunization series with Td-containing vaccines should begin or complete a primary immunization series including a Tdap dose. Adults should receive a Td booster every 10 years.  Varicella vaccine. An adult without evidence of immunity to varicella should receive 2 doses or a second dose if she has previously received 1 dose. Pregnant females who do not have evidence of immunity should receive the first dose after pregnancy. This first dose should be obtained before leaving the health care facility. The second dose should be obtained 4-8 weeks after the first dose.  Human papillomavirus (HPV) vaccine. Females aged 13-26 years who have not received the vaccine previously should obtain the 3-dose series. The vaccine is not recommended for use in pregnant females. However, pregnancy testing is not needed before receiving a dose. If a female is found to be pregnant after receiving a dose, no treatment is needed. In that case, the remaining doses should be delayed until after the pregnancy. Immunization is recommended for any person with an immunocompromised condition through the age of 61 years if she did not get any or all doses earlier. During the  3-dose series, the second dose should be obtained 4-8 weeks after the first dose. The third dose should be obtained 24 weeks after the first dose and 16 weeks after the second dose.  Zoster vaccine. One dose is recommended for adults aged 30 years or older unless certain conditions are present.  Measles, mumps, and rubella (MMR) vaccine. Adults born  before 1957 generally are considered immune to measles and mumps. Adults born in 1957 or later should have 1 or more doses of MMR vaccine unless there is a contraindication to the vaccine or there is laboratory evidence of immunity to each of the three diseases. A routine second dose of MMR vaccine should be obtained at least 28 days after the first dose for students attending postsecondary schools, health care workers, or international travelers. People who received inactivated measles vaccine or an unknown type of measles vaccine during 1963-1967 should receive 2 doses of MMR vaccine. People who received inactivated mumps vaccine or an unknown type of mumps vaccine before 1979 and are at high risk for mumps infection should consider immunization with 2 doses of MMR vaccine. For females of childbearing age, rubella immunity should be determined. If there is no evidence of immunity, females who are not pregnant should be vaccinated. If there is no evidence of immunity, females who are pregnant should delay immunization until after pregnancy. Unvaccinated health care workers born before 1957 who lack laboratory evidence of measles, mumps, or rubella immunity or laboratory confirmation of disease should consider measles and mumps immunization with 2 doses of MMR vaccine or rubella immunization with 1 dose of MMR vaccine.  Pneumococcal 13-valent conjugate (PCV13) vaccine. When indicated, a person who is uncertain of his immunization history and has no record of immunization should receive the PCV13 vaccine. All adults 65 years of age and older should receive this  vaccine. An adult aged 19 years or older who has certain medical conditions and has not been previously immunized should receive 1 dose of PCV13 vaccine. This PCV13 should be followed with a dose of pneumococcal polysaccharide (PPSV23) vaccine. Adults who are at high risk for pneumococcal disease should obtain the PPSV23 vaccine at least 8 weeks after the dose of PCV13 vaccine. Adults older than 76 years of age who have normal immune system function should obtain the PPSV23 vaccine dose at least 1 year after the dose of PCV13 vaccine.  Pneumococcal polysaccharide (PPSV23) vaccine. When PCV13 is also indicated, PCV13 should be obtained first. All adults aged 65 years and older should be immunized. An adult younger than age 65 years who has certain medical conditions should be immunized. Any person who resides in a nursing home or long-term care facility should be immunized. An adult smoker should be immunized. People with an immunocompromised condition and certain other conditions should receive both PCV13 and PPSV23 vaccines. People with human immunodeficiency virus (HIV) infection should be immunized as soon as possible after diagnosis. Immunization during chemotherapy or radiation therapy should be avoided. Routine use of PPSV23 vaccine is not recommended for American Indians, [place] Natives, or people younger than 65 years unless there are medical conditions that require PPSV23 vaccine. When indicated, people who have unknown immunization and have no record of immunization should receive PPSV23 vaccine. One-time revaccination 5 years after the first dose of PPSV23 is recommended for people aged 19-64 years who have chronic kidney failure, nephrotic syndrome, asplenia, or immunocompromised conditions. People who received 1-2 doses of PPSV23 before age 65 years should receive another dose of PPSV23 vaccine at age 65 years or later if at least 5 years have passed since the previous dose. Doses of PPSV23 are not  needed for people immunized with PPSV23 at or after age 65 years.  Meningococcal vaccine. Adults with asplenia or persistent complement component deficiencies should receive 2 doses of quadrivalent meningococcal conjugate (MenACWY-D) vaccine. The doses should be obtained   at least 2 months apart. Microbiologists working with certain meningococcal bacteria, [place] recruits, people at risk during an outbreak, and people who travel to or live in countries with a high rate of meningitis should be immunized. A first-year college student up through age 34 years who is living in a residence hall should receive a dose if she did not receive a dose on or after her 16th birthday. Adults who have certain high-risk conditions should receive one or more doses of vaccine.  Hepatitis A vaccine. Adults who wish to be protected from this disease, have certain high-risk conditions, work with hepatitis A-infected animals, work in hepatitis A research labs, or travel to or work in countries with a high rate of hepatitis A should be immunized. Adults who were previously unvaccinated and who anticipate close contact with an international adoptee during the first 60 days after arrival in the [place] from a country with a high rate of hepatitis A should be immunized.  Hepatitis B vaccine. Adults who wish to be protected from this disease, have certain high-risk conditions, may be exposed to blood or other infectious body fluids, are household contacts or sex partners of hepatitis B positive people, are clients or workers in certain care facilities, or travel to or work in countries with a high rate of hepatitis B should be immunized.  Haemophilus influenzae type b (Hib) vaccine. A previously unvaccinated person with asplenia or sickle cell disease or having a scheduled splenectomy should receive 1 dose of Hib vaccine. Regardless of previous immunization, a recipient of a hematopoietic stem cell transplant should receive a  3-dose series 6-12 months after her successful transplant. Hib vaccine is not recommended for adults with HIV infection. Preventive Services / Frequency Ages 35 to 4 years  Blood pressure check.** / Every 3-5 years.  Lipid and cholesterol check.** / Every 5 years beginning at age 60.  Clinical breast exam.** / Every 3 years for women in their 71s and 10s.  BRCA-related cancer risk assessment.** / For women who have family members with a BRCA-related cancer (breast, ovarian, tubal, or peritoneal cancers).  Pap test.** / Every 2 years from ages 76 through 26. Every 3 years starting at age 61 through age 76 or 93 with a history of 3 consecutive normal Pap tests.  HPV screening.** / Every 3 years from ages 37 through ages 60 to 51 with a history of 3 consecutive normal Pap tests.  Hepatitis C blood test.** / For any individual with known risks for hepatitis C.  Skin self-exam. / Monthly.  Influenza vaccine. / Every year.  Tetanus, diphtheria, and acellular pertussis (Tdap, Td) vaccine.** / Consult your health care provider. Pregnant women should receive 1 dose of Tdap vaccine during each pregnancy. 1 dose of Td every 10 years.  Varicella vaccine.** / Consult your health care provider. Pregnant females who do not have evidence of immunity should receive the first dose after pregnancy.  HPV vaccine. / 3 doses over 6 months, if 93 and younger. The vaccine is not recommended for use in pregnant females. However, pregnancy testing is not needed before receiving a dose.  Measles, mumps, rubella (MMR) vaccine.** / You need at least 1 dose of MMR if you were born in 1957 or later. You may also need a 2nd dose. For females of childbearing age, rubella immunity should be determined. If there is no evidence of immunity, females who are not pregnant should be vaccinated. If there is no evidence of immunity, females who are  pregnant should delay immunization until after pregnancy.  Pneumococcal  13-valent conjugate (PCV13) vaccine.** / Consult your health care provider.  Pneumococcal polysaccharide (PPSV23) vaccine.** / 1 to 2 doses if you smoke cigarettes or if you have certain conditions.  Meningococcal vaccine.** / 1 dose if you are age 68 to 8 years and a Market researcher living in a residence hall, or have one of several medical conditions, you need to get vaccinated against meningococcal disease. You may also need additional booster doses.  Hepatitis A vaccine.** / Consult your health care provider.  Hepatitis B vaccine.** / Consult your health care provider.  Haemophilus influenzae type b (Hib) vaccine.** / Consult your health care provider. Ages 7 to 53 years  Blood pressure check.** / Every year.  Lipid and cholesterol check.** / Every 5 years beginning at age 25 years.  Lung cancer screening. / Every year if you are aged 11-80 years and have a 30-pack-year history of smoking and currently smoke or have quit within the past 15 years. Yearly screening is stopped once you have quit smoking for at least 15 years or develop a health problem that would prevent you from having lung cancer treatment.  Clinical breast exam.** / Every year after age 48 years.  BRCA-related cancer risk assessment.** / For women who have family members with a BRCA-related cancer (breast, ovarian, tubal, or peritoneal cancers).  Mammogram.** / Every year beginning at age 41 years and continuing for as long as you are in good health. Consult with your health care provider.  Pap test.** / Every 3 years starting at age 65 years through age 37 or 70 years with a history of 3 consecutive normal Pap tests.  HPV screening.** / Every 3 years from ages 72 years through ages 60 to 40 years with a history of 3 consecutive normal Pap tests.  Fecal occult blood test (FOBT) of stool. / Every year beginning at age 21 years and continuing until age 5 years. You may not need to do this test if you get  a colonoscopy every 10 years.  Flexible sigmoidoscopy or colonoscopy.** / Every 5 years for a flexible sigmoidoscopy or every 10 years for a colonoscopy beginning at age 35 years and continuing until age 48 years.  Hepatitis C blood test.** / For all people born from [birth year] through 1965 and any individual with known risks for hepatitis C.  Skin self-exam. / Monthly.  Influenza vaccine. / Every year.  Tetanus, diphtheria, and acellular pertussis (Tdap/Td) vaccine.** / Consult your health care provider. Pregnant women should receive 1 dose of Tdap vaccine during each pregnancy. 1 dose of Td every 10 years.  Varicella vaccine.** / Consult your health care provider. Pregnant females who do not have evidence of immunity should receive the first dose after pregnancy.  Zoster vaccine.** / 1 dose for adults aged 30 years or older.  Measles, mumps, rubella (MMR) vaccine.** / You need at least 1 dose of MMR if you were born in 1957 or later. You may also need a second dose. For females of childbearing age, rubella immunity should be determined. If there is no evidence of immunity, females who are not pregnant should be vaccinated. If there is no evidence of immunity, females who are pregnant should delay immunization until after pregnancy.  Pneumococcal 13-valent conjugate (PCV13) vaccine.** / Consult your health care provider.  Pneumococcal polysaccharide (PPSV23) vaccine.** / 1 to 2 doses if you smoke cigarettes or if you have certain conditions.  Meningococcal vaccine.** /  Consult your health care provider.  Hepatitis A vaccine.** / Consult your health care provider.  Hepatitis B vaccine.** / Consult your health care provider.  Haemophilus influenzae type b (Hib) vaccine.** / Consult your health care provider. Ages 64 years and over  Blood pressure check.** / Every year.  Lipid and cholesterol check.** / Every 5 years beginning at age 23 years.  Lung cancer screening. / Every year if you  are aged 16-80 years and have a 30-pack-year history of smoking and currently smoke or have quit within the past 15 years. Yearly screening is stopped once you have quit smoking for at least 15 years or develop a health problem that would prevent you from having lung cancer treatment.  Clinical breast exam.** / Every year after age 74 years.  BRCA-related cancer risk assessment.** / For women who have family members with a BRCA-related cancer (breast, ovarian, tubal, or peritoneal cancers).  Mammogram.** / Every year beginning at age 44 years and continuing for as long as you are in good health. Consult with your health care provider.  Pap test.** / Every 3 years starting at age 58 years through age 22 or 39 years with 3 consecutive normal Pap tests. Testing can be stopped between 65 and 70 years with 3 consecutive normal Pap tests and no abnormal Pap or HPV tests in the past 10 years.  HPV screening.** / Every 3 years from ages 64 years through ages 70 or 61 years with a history of 3 consecutive normal Pap tests. Testing can be stopped between 65 and 70 years with 3 consecutive normal Pap tests and no abnormal Pap or HPV tests in the past 10 years.  Fecal occult blood test (FOBT) of stool. / Every year beginning at age 40 years and continuing until age 27 years. You may not need to do this test if you get a colonoscopy every 10 years.  Flexible sigmoidoscopy or colonoscopy.** / Every 5 years for a flexible sigmoidoscopy or every 10 years for a colonoscopy beginning at age 7 years and continuing until age 32 years.  Hepatitis C blood test.** / For all people born from [birth year] through 1966 and any individual with known risks for hepatitis C.  Osteoporosis screening.** / A one-time screening for women ages 30 years and over and women at risk for fractures or osteoporosis.  Skin self-exam. / Monthly.  Influenza vaccine. / Every year.  Tetanus, diphtheria, and acellular pertussis (Tdap/Td)  vaccine.** / 1 dose of Td every 10 years.  Varicella vaccine.** / Consult your health care provider.  Zoster vaccine.** / 1 dose for adults aged 35 years or older.  Pneumococcal 13-valent conjugate (PCV13) vaccine.** / Consult your health care provider.  Pneumococcal polysaccharide (PPSV23) vaccine.** / 1 dose for all adults aged 46 years and older.  Meningococcal vaccine.** / Consult your health care provider.  Hepatitis A vaccine.** / Consult your health care provider.  Hepatitis B vaccine.** / Consult your health care provider.  Haemophilus influenzae type b (Hib) vaccine.** / Consult your health care provider. ** Family history and personal history of risk and conditions may change your health care provider's recommendations.   This information is not intended to replace advice given to you by your health care provider. Make sure you discuss any questions you have with your health care provider.   Document Released: 06/23/2001 Document Revised: 05/18/2014 Document Reviewed: 09/22/2010 Elsevier Interactive Patient Education [employer].
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
## 2015-08-08 NOTE — Assessment & Plan Note (Signed)
healed 

## 2015-08-08 NOTE — Progress Notes (Signed)
Pre visit review using our clinic review tool, if applicable. No additional management support is needed unless otherwise documented below in the visit note. 

## 2015-08-08 NOTE — Assessment & Plan Note (Signed)
Labs, UA Coreg, Spironolactone

## 2015-08-08 NOTE — Assessment & Plan Note (Addendum)
Here for medicare wellness/physical  Diet: heart healthy  Physical activity: not sedentary  Depression/mood screen: negative  Hearing: intact to whispered voice  Visual acuity: grossly normal w/glasses, performs annual eye exam  ADLs: capable  Fall risk: moderate - using a cane  Home safety: good  Cognitive evaluation: intact to orientation, naming, recall and repetition  EOL planning: adv directives, full code/ I agree  I have personally reviewed and have noted  1. The patient's medical, surgical and social history  2. Their use of alcohol, tobacco or illicit drugs  3. Their current medications and supplements  4. The patient's functional ability including ADL's, fall risks, home safety risks and hearing or visual impairment.  5. Diet and physical activities  6. Evidence for depression or mood disorders 7. The roster of all physicians providing medical care to patient - is listed in the Snapshot section of the chart and reviewed today.    Today patient counseled on age appropriate routine health concerns for screening and prevention, each reviewed and up to date or declined. Immunizations reviewed and up to date or declined. Labs ordered and reviewed. Risk factors for depression reviewed and negative. Hearing function and visual acuity are intact. ADLs screened and addressed as needed. Functional ability and level of safety reviewed and appropriate. Education, counseling and referrals performed based on assessed risks today. Patient provided with a copy of personalized plan for preventive services.   Colon due 2017 Cologuard info
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
## 2015-08-08 NOTE — Assessment & Plan Note (Signed)
Chronic - taking Glimeperide Labs

## 2015-08-09 DIAGNOSIS — L97211 Non-pressure chronic ulcer of right calf limited to breakdown of skin: Secondary | ICD-10-CM | POA: Diagnosis not present

## 2015-08-09 DIAGNOSIS — E11622 Type 2 diabetes mellitus with other skin ulcer: Secondary | ICD-10-CM | POA: Diagnosis not present

## 2015-08-09 DIAGNOSIS — E1151 Type 2 diabetes mellitus with diabetic peripheral angiopathy without gangrene: Secondary | ICD-10-CM | POA: Diagnosis not present

## 2015-08-09 DIAGNOSIS — Z7984 Long term (current) use of oral hypoglycemic drugs: Secondary | ICD-10-CM | POA: Diagnosis not present

## 2015-08-09 DIAGNOSIS — L97219 Non-pressure chronic ulcer of right calf with unspecified severity: Secondary | ICD-10-CM | POA: Diagnosis not present

## 2015-08-09 DIAGNOSIS — E1140 Type 2 diabetes mellitus with diabetic neuropathy, unspecified: Secondary | ICD-10-CM | POA: Diagnosis not present

## 2015-08-09 DIAGNOSIS — I10 Essential (primary) hypertension: Secondary | ICD-10-CM | POA: Diagnosis not present

## 2015-08-16 ENCOUNTER — Encounter (HOSPITAL_BASED_OUTPATIENT_CLINIC_OR_DEPARTMENT_OTHER): Payer: Medicare Other | Attending: Internal Medicine

## 2015-08-16 DIAGNOSIS — D571 Sickle-cell disease without crisis: Secondary | ICD-10-CM | POA: Insufficient documentation

## 2015-08-16 DIAGNOSIS — L97211 Non-pressure chronic ulcer of right calf limited to breakdown of skin: Secondary | ICD-10-CM | POA: Insufficient documentation

## 2015-08-16 DIAGNOSIS — I10 Essential (primary) hypertension: Secondary | ICD-10-CM | POA: Insufficient documentation

## 2015-08-16 DIAGNOSIS — L97219 Non-pressure chronic ulcer of right calf with unspecified severity: Secondary | ICD-10-CM | POA: Insufficient documentation

## 2015-08-16 DIAGNOSIS — E11622 Type 2 diabetes mellitus with other skin ulcer: Secondary | ICD-10-CM | POA: Diagnosis not present

## 2015-08-16 DIAGNOSIS — L03115 Cellulitis of right lower limb: Secondary | ICD-10-CM | POA: Insufficient documentation

## 2015-08-23 DIAGNOSIS — E11622 Type 2 diabetes mellitus with other skin ulcer: Secondary | ICD-10-CM | POA: Diagnosis not present

## 2015-08-23 DIAGNOSIS — L03115 Cellulitis of right lower limb: Secondary | ICD-10-CM | POA: Diagnosis not present

## 2015-08-23 DIAGNOSIS — L97211 Non-pressure chronic ulcer of right calf limited to breakdown of skin: Secondary | ICD-10-CM | POA: Diagnosis not present

## 2015-08-23 DIAGNOSIS — D571 Sickle-cell disease without crisis: Secondary | ICD-10-CM | POA: Diagnosis not present

## 2015-08-23 DIAGNOSIS — I10 Essential (primary) hypertension: Secondary | ICD-10-CM | POA: Diagnosis not present

## 2015-08-23 DIAGNOSIS — L97219 Non-pressure chronic ulcer of right calf with unspecified severity: Secondary | ICD-10-CM | POA: Diagnosis not present

## 2015-08-30 DIAGNOSIS — L97211 Non-pressure chronic ulcer of right calf limited to breakdown of skin: Secondary | ICD-10-CM | POA: Diagnosis not present

## 2015-08-30 DIAGNOSIS — E11622 Type 2 diabetes mellitus with other skin ulcer: Secondary | ICD-10-CM | POA: Diagnosis not present

## 2015-08-30 DIAGNOSIS — L03115 Cellulitis of right lower limb: Secondary | ICD-10-CM | POA: Diagnosis not present

## 2015-08-30 DIAGNOSIS — I10 Essential (primary) hypertension: Secondary | ICD-10-CM | POA: Diagnosis not present

## 2015-08-30 DIAGNOSIS — L97219 Non-pressure chronic ulcer of right calf with unspecified severity: Secondary | ICD-10-CM | POA: Diagnosis not present

## 2015-08-30 DIAGNOSIS — D571 Sickle-cell disease without crisis: Secondary | ICD-10-CM | POA: Diagnosis not present

## 2015-08-30 DIAGNOSIS — L97811 Non-pressure chronic ulcer of other part of right lower leg limited to breakdown of skin: Secondary | ICD-10-CM | POA: Diagnosis not present

## 2015-09-06 DIAGNOSIS — E11622 Type 2 diabetes mellitus with other skin ulcer: Secondary | ICD-10-CM | POA: Diagnosis not present

## 2015-09-06 DIAGNOSIS — L97211 Non-pressure chronic ulcer of right calf limited to breakdown of skin: Secondary | ICD-10-CM | POA: Diagnosis not present

## 2015-09-06 DIAGNOSIS — L03115 Cellulitis of right lower limb: Secondary | ICD-10-CM | POA: Diagnosis not present

## 2015-09-06 DIAGNOSIS — L97219 Non-pressure chronic ulcer of right calf with unspecified severity: Secondary | ICD-10-CM | POA: Diagnosis not present

## 2015-09-06 DIAGNOSIS — L97212 Non-pressure chronic ulcer of right calf with fat layer exposed: Secondary | ICD-10-CM | POA: Diagnosis not present

## 2015-09-06 DIAGNOSIS — D571 Sickle-cell disease without crisis: Secondary | ICD-10-CM | POA: Diagnosis not present

## 2015-09-06 DIAGNOSIS — I10 Essential (primary) hypertension: Secondary | ICD-10-CM | POA: Diagnosis not present

## 2015-09-13 ENCOUNTER — Encounter (HOSPITAL_BASED_OUTPATIENT_CLINIC_OR_DEPARTMENT_OTHER): Payer: Medicare Other | Attending: Internal Medicine

## 2015-09-13 DIAGNOSIS — E11622 Type 2 diabetes mellitus with other skin ulcer: Secondary | ICD-10-CM | POA: Insufficient documentation

## 2015-09-13 DIAGNOSIS — I10 Essential (primary) hypertension: Secondary | ICD-10-CM | POA: Diagnosis not present

## 2015-09-13 DIAGNOSIS — L97211 Non-pressure chronic ulcer of right calf limited to breakdown of skin: Secondary | ICD-10-CM | POA: Diagnosis not present

## 2015-09-13 DIAGNOSIS — L97811 Non-pressure chronic ulcer of other part of right lower leg limited to breakdown of skin: Secondary | ICD-10-CM | POA: Diagnosis not present

## 2015-09-20 DIAGNOSIS — I10 Essential (primary) hypertension: Secondary | ICD-10-CM | POA: Diagnosis not present

## 2015-09-20 DIAGNOSIS — M7989 Other specified soft tissue disorders: Secondary | ICD-10-CM | POA: Diagnosis not present

## 2015-09-20 DIAGNOSIS — L97811 Non-pressure chronic ulcer of other part of right lower leg limited to breakdown of skin: Secondary | ICD-10-CM | POA: Diagnosis not present

## 2015-09-20 DIAGNOSIS — E11622 Type 2 diabetes mellitus with other skin ulcer: Secondary | ICD-10-CM | POA: Diagnosis not present

## 2015-09-20 DIAGNOSIS — L97211 Non-pressure chronic ulcer of right calf limited to breakdown of skin: Secondary | ICD-10-CM | POA: Diagnosis not present

## 2015-09-20 DIAGNOSIS — I87331 Chronic venous hypertension (idiopathic) with ulcer and inflammation of right lower extremity: Secondary | ICD-10-CM | POA: Diagnosis not present

## 2015-09-23 DIAGNOSIS — I10 Essential (primary) hypertension: Secondary | ICD-10-CM | POA: Diagnosis not present

## 2015-09-23 DIAGNOSIS — E11622 Type 2 diabetes mellitus with other skin ulcer: Secondary | ICD-10-CM | POA: Diagnosis not present

## 2015-09-23 DIAGNOSIS — L97211 Non-pressure chronic ulcer of right calf limited to breakdown of skin: Secondary | ICD-10-CM | POA: Diagnosis not present

## 2015-09-27 DIAGNOSIS — I10 Essential (primary) hypertension: Secondary | ICD-10-CM | POA: Diagnosis not present

## 2015-09-27 DIAGNOSIS — L97811 Non-pressure chronic ulcer of other part of right lower leg limited to breakdown of skin: Secondary | ICD-10-CM | POA: Diagnosis not present

## 2015-09-27 DIAGNOSIS — E11622 Type 2 diabetes mellitus with other skin ulcer: Secondary | ICD-10-CM | POA: Diagnosis not present

## 2015-09-27 DIAGNOSIS — L97211 Non-pressure chronic ulcer of right calf limited to breakdown of skin: Secondary | ICD-10-CM | POA: Diagnosis not present

## 2015-10-04 DIAGNOSIS — I10 Essential (primary) hypertension: Secondary | ICD-10-CM | POA: Diagnosis not present

## 2015-10-04 DIAGNOSIS — L97811 Non-pressure chronic ulcer of other part of right lower leg limited to breakdown of skin: Secondary | ICD-10-CM | POA: Diagnosis not present

## 2015-10-04 DIAGNOSIS — E11622 Type 2 diabetes mellitus with other skin ulcer: Secondary | ICD-10-CM | POA: Diagnosis not present

## 2015-10-04 DIAGNOSIS — L97211 Non-pressure chronic ulcer of right calf limited to breakdown of skin: Secondary | ICD-10-CM | POA: Diagnosis not present

## 2015-10-18 ENCOUNTER — Encounter (HOSPITAL_BASED_OUTPATIENT_CLINIC_OR_DEPARTMENT_OTHER): Payer: Medicare Other | Attending: Internal Medicine

## 2015-10-18 DIAGNOSIS — E11622 Type 2 diabetes mellitus with other skin ulcer: Secondary | ICD-10-CM | POA: Insufficient documentation

## 2015-10-18 DIAGNOSIS — L97211 Non-pressure chronic ulcer of right calf limited to breakdown of skin: Secondary | ICD-10-CM | POA: Diagnosis not present

## 2015-10-18 DIAGNOSIS — I10 Essential (primary) hypertension: Secondary | ICD-10-CM | POA: Insufficient documentation

## 2015-10-18 DIAGNOSIS — L97811 Non-pressure chronic ulcer of other part of right lower leg limited to breakdown of skin: Secondary | ICD-10-CM | POA: Diagnosis not present

## 2015-10-25 ENCOUNTER — Other Ambulatory Visit (INDEPENDENT_AMBULATORY_CARE_PROVIDER_SITE_OTHER): Payer: Medicare Other

## 2015-10-25 DIAGNOSIS — D649 Anemia, unspecified: Secondary | ICD-10-CM | POA: Diagnosis not present

## 2015-10-25 DIAGNOSIS — E11622 Type 2 diabetes mellitus with other skin ulcer: Secondary | ICD-10-CM | POA: Diagnosis not present

## 2015-10-25 DIAGNOSIS — Z0000 Encounter for general adult medical examination without abnormal findings: Secondary | ICD-10-CM | POA: Diagnosis not present

## 2015-10-25 DIAGNOSIS — E785 Hyperlipidemia, unspecified: Secondary | ICD-10-CM | POA: Diagnosis not present

## 2015-10-25 DIAGNOSIS — I10 Essential (primary) hypertension: Secondary | ICD-10-CM | POA: Diagnosis not present

## 2015-10-25 DIAGNOSIS — S81801A Unspecified open wound, right lower leg, initial encounter: Secondary | ICD-10-CM | POA: Diagnosis not present

## 2015-10-25 DIAGNOSIS — L97211 Non-pressure chronic ulcer of right calf limited to breakdown of skin: Secondary | ICD-10-CM | POA: Diagnosis not present

## 2015-10-25 DIAGNOSIS — E1121 Type 2 diabetes mellitus with diabetic nephropathy: Secondary | ICD-10-CM | POA: Diagnosis not present

## 2015-10-25 LAB — HEPATIC FUNCTION PANEL
ALK PHOS: 74 U/L (ref 39–117)
ALT: 12 U/L (ref 0–35)
AST: 14 U/L (ref 0–37)
Albumin: 4.1 g/dL (ref 3.5–5.2)
BILIRUBIN DIRECT: 0.2 mg/dL (ref 0.0–0.3)
Total Bilirubin: 0.8 mg/dL (ref 0.2–1.2)
Total Protein: 6.9 g/dL (ref 6.0–8.3)

## 2015-10-25 LAB — BASIC METABOLIC PANEL
BUN: 34 mg/dL — AB (ref 6–23)
CHLORIDE: 108 meq/L (ref 96–112)
CO2: 24 mEq/L (ref 19–32)
Calcium: 10 mg/dL (ref 8.4–10.5)
Creatinine, Ser: 1.59 mg/dL — ABNORMAL HIGH (ref 0.40–1.20)
GFR: 40.62 mL/min — AB (ref >60.00)
Glucose, Bld: 180 mg/dL — ABNORMAL HIGH (ref 70–99)
POTASSIUM: 4.6 meq/L (ref 3.5–5.1)
SODIUM: 140 meq/L (ref 135–145)

## 2015-10-25 LAB — LIPID PANEL
CHOL/HDL RATIO: 5
Cholesterol: 188 mg/dL (ref 0–200)
HDL: 39.4 mg/dL (ref >39.00)
LDL CALC: 123 mg/dL — AB (ref 0–99)
NONHDL: 149.01
Triglycerides: 129 mg/dL (ref 0.0–149.0)
VLDL: 25.8 mg/dL (ref 0.0–40.0)

## 2015-10-25 LAB — CBC WITH DIFFERENTIAL/PLATELET
BASOS PCT: 0.4 % (ref 0.0–3.0)
Basophils Absolute: 0 10*3/uL (ref 0.0–0.1)
EOS ABS: 0.1 10*3/uL (ref 0.0–0.7)
Eosinophils Relative: 1.6 % (ref 0.0–5.0)
HEMATOCRIT: 37.8 % (ref 36.0–46.0)
Hemoglobin: 12.4 g/dL (ref 12.0–15.0)
LYMPHS ABS: 1.9 10*3/uL (ref 0.7–4.0)
Lymphocytes Relative: 25.7 % (ref 12.0–46.0)
MCHC: 33 g/dL (ref 30.0–36.0)
MCV: 92.1 fl (ref 78.0–100.0)
MONO ABS: 0.7 10*3/uL (ref 0.1–1.0)
Monocytes Relative: 9.2 % (ref 3.0–12.0)
NEUTROS ABS: 4.8 10*3/uL (ref 1.4–7.7)
Neutrophils Relative %: 63.1 % (ref 43.0–77.0)
PLATELETS: 154 10*3/uL (ref 150.0–400.0)
RBC: 4.1 Mil/uL (ref 3.87–5.11)
RDW: 14 % (ref 11.5–15.5)
WBC: 7.6 10*3/uL (ref 4.0–10.5)

## 2015-10-25 LAB — HEMOGLOBIN A1C: HEMOGLOBIN A1C: 6.1 % (ref 4.6–6.5)

## 2015-10-25 LAB — TSH: TSH: 2.69 u[IU]/mL (ref 0.35–4.50)

## 2015-10-31 DIAGNOSIS — L97211 Non-pressure chronic ulcer of right calf limited to breakdown of skin: Secondary | ICD-10-CM | POA: Diagnosis not present

## 2015-10-31 DIAGNOSIS — E11622 Type 2 diabetes mellitus with other skin ulcer: Secondary | ICD-10-CM | POA: Diagnosis not present

## 2015-10-31 DIAGNOSIS — L97811 Non-pressure chronic ulcer of other part of right lower leg limited to breakdown of skin: Secondary | ICD-10-CM | POA: Diagnosis not present

## 2015-10-31 DIAGNOSIS — I10 Essential (primary) hypertension: Secondary | ICD-10-CM | POA: Diagnosis not present

## 2015-11-04 ENCOUNTER — Other Ambulatory Visit: Payer: Self-pay | Admitting: Internal Medicine

## 2015-11-05 ENCOUNTER — Encounter: Payer: Self-pay | Admitting: Internal Medicine

## 2015-11-05 ENCOUNTER — Ambulatory Visit (INDEPENDENT_AMBULATORY_CARE_PROVIDER_SITE_OTHER): Payer: Medicare Other | Admitting: Internal Medicine

## 2015-11-05 VITALS — BP 130/70 | HR 69 | Wt 143.0 lb

## 2015-11-05 DIAGNOSIS — E1121 Type 2 diabetes mellitus with diabetic nephropathy: Secondary | ICD-10-CM | POA: Diagnosis not present

## 2015-11-05 DIAGNOSIS — R635 Abnormal weight gain: Secondary | ICD-10-CM

## 2015-11-05 DIAGNOSIS — I10 Essential (primary) hypertension: Secondary | ICD-10-CM | POA: Diagnosis not present

## 2015-11-05 DIAGNOSIS — N3281 Overactive bladder: Secondary | ICD-10-CM | POA: Diagnosis not present

## 2015-11-05 DIAGNOSIS — S81801D Unspecified open wound, right lower leg, subsequent encounter: Secondary | ICD-10-CM | POA: Diagnosis not present

## 2015-11-05 MED ORDER — OXYBUTYNIN CHLORIDE 5 MG PO TABS: 5.0000 mg | ORAL_TABLET | Freq: Three times a day (TID) | ORAL | Status: DC | PRN

## 2015-11-05 NOTE — Assessment & Plan Note (Signed)
Start Ditropan, may need to d/c Spironolactone

## 2015-11-05 NOTE — Assessment & Plan Note (Signed)
Labs On Glimepiride

## 2015-11-05 NOTE — Progress Notes (Signed)
Pre visit review using our clinic review tool, if applicable. No additional management support is needed unless otherwise documented below in the visit note. 

## 2015-11-05 NOTE — Assessment & Plan Note (Signed)
Coreg, Spironolactone 

## 2015-11-05 NOTE — Progress Notes (Signed)
Subjective:  Patient ID: Brandy Gonzales, female    DOB: 1940/06/17  Age: 75 y.o. MRN: [id number]  CC: No chief complaint on file.   HPI GLENNA KETCHAM presents for HTN, DM2, OAB f/u. Not taking Ditropan for ?reason  Outpatient Prescriptions Prior to Visit  Medication Sig Dispense Refill  . aspirin 81 MG EC tablet Take 81 mg by mouth daily.      . carvedilol (COREG) 25 MG tablet TAKE ONE TABLET BY MOUTH TWICE DAILY WITH  A  MEAL 60 tablet 11  . cholecalciferol (VITAMIN D) 1000 UNITS tablet Take 1 tablet (1,000 Units total) by mouth daily. 100 tablet 3  . glimepiride (AMARYL) 2 MG tablet TAKE ONE TABLET BY MOUTH BEFORE BREAKFAST - for diabetes 30 tablet 11  . glimepiride (AMARYL) 2 MG tablet TAKE ONE TABLET BY MOUTH BEFORE BREAKFAST 30 tablet 11  . oxybutynin (DITROPAN) 5 MG tablet Take 1 tablet (5 mg total) by mouth 3 (three) times daily as needed for bladder spasms (urinary frequency). 90 tablet 3  . spironolactone (ALDACTONE) 50 MG tablet TAKE ONE TABLET BY MOUTH DAILY FOR  HIGH BLOOD PRESSURE 30 tablet 0  . traMADol-acetaminophen (ULTRACET) 37.5-325 MG tablet Take 1 tablet by mouth every 6 (six) hours as needed. 15 tablet 0  . diclofenac sodium (VOLTAREN) 1 % GEL Apply 1 application topically 4 (four) times daily. (Patient not taking: Reported on 11/05/2015) 100 g 0  . omeprazole (PRILOSEC) 20 MG capsule Take 1 capsule (20 mg total) by mouth 2 (two) times daily before a meal. For indigestion/acid 60 capsule 11  . ondansetron (ZOFRAN) 4 MG tablet Take 1 tablet (4 mg total) by mouth every 6 (six) hours. (Patient not taking: Reported on 11/05/2015) 12 tablet 0   No facility-administered medications prior to visit.    ROS Review of Systems  Constitutional: Negative for chills, activity change, appetite change, fatigue and unexpected weight change.  HENT: Negative for congestion, mouth sores and sinus pressure.   Eyes: Negative for visual disturbance.  Respiratory: Negative for cough and  chest tightness.   Gastrointestinal: Negative for nausea and abdominal pain.  Genitourinary: Positive for urgency and frequency. Negative for difficulty urinating and vaginal pain.  Musculoskeletal: Positive for gait problem. Negative for back pain.  Skin: Negative for pallor and rash.  Neurological: Negative for dizziness, tremors, weakness, numbness and headaches.  Psychiatric/Behavioral: Negative for confusion and sleep disturbance.    Objective:  BP 130/70 mmHg  Pulse 69  Wt 143 lb (64.864 kg)  SpO2 96%  BP Readings from Last 3 Encounters:  11/05/15 130/70  08/08/15 120/64  07/19/15 140/70    Wt Readings from Last 3 Encounters:  11/05/15 143 lb (64.864 kg)  08/08/15 141 lb (63.957 kg)  07/19/15 144 lb (65.318 kg)    Physical Exam  Constitutional: She appears well-developed. No distress.  HENT:  Head: Normocephalic.  Right Ear: External ear normal.  Left Ear: External ear normal.  Nose: Nose normal.  Mouth/Throat: Oropharynx is clear and moist.  Eyes: Conjunctivae are normal. Pupils are equal, round, and reactive to light. Right eye exhibits no discharge. Left eye exhibits no discharge.  Neck: Normal range of motion. Neck supple. No JVD present. No tracheal deviation present. No thyromegaly present.  Cardiovascular: Normal rate, regular rhythm and normal heart sounds.   Pulmonary/Chest: No stridor. No respiratory distress. She has no wheezes.  Abdominal: Soft. Bowel sounds are normal. She exhibits no distension and no mass. There is no tenderness. There  is no rebound and no guarding.  Musculoskeletal: She exhibits no edema or tenderness.  Lymphadenopathy:    She has no cervical adenopathy.  Neurological: She displays normal reflexes. No cranial nerve deficit. She exhibits normal muscle tone. Coordination abnormal.  Skin: No rash noted. No erythema.  Psychiatric: She has a normal mood and affect. Her behavior is normal. Judgment and thought content normal.  Cane  Lab  Results  Component Value Date   WBC 7.6 10/25/2015   HGB 12.4 10/25/2015   HCT 37.8 10/25/2015   PLT 154.0 10/25/2015   GLUCOSE 180* 10/25/2015   CHOL 188 10/25/2015   TRIG 129.0 10/25/2015   HDL 39.40 10/25/2015   LDLDIRECT 78.9 10/22/2008   LDLCALC 123* 10/25/2015   ALT 12 10/25/2015   AST 14 10/25/2015   NA 140 10/25/2015   K 4.6 10/25/2015   CL 108 10/25/2015   CREATININE 1.59* 10/25/2015   BUN 34* 10/25/2015   CO2 24 10/25/2015   TSH 2.69 10/25/2015   INR 1.03 08/16/2011   HGBA1C 6.1 10/25/2015   MICROALBUR 0.7 08/08/2015    No results found.  Assessment & Plan:   There are no diagnoses linked to this encounter. I am having Ms. Mierzwa maintain her aspirin, glimepiride, omeprazole, carvedilol, cholecalciferol, ondansetron, oxybutynin, glimepiride, diclofenac sodium, traMADol-acetaminophen, and spironolactone.  No orders of the defined types were placed in this encounter.     Follow-up: No Follow-up on file.  Walker Kehr, MD

## 2015-11-05 NOTE — Assessment & Plan Note (Signed)
Wt Readings from Last 3 Encounters:  11/05/15 143 lb (64.864 kg)  08/08/15 141 lb (63.957 kg)  07/19/15 144 lb (65.318 kg)

## 2015-11-05 NOTE — Patient Instructions (Signed)
Cold tea bags to eyes

## 2015-11-05 NOTE — Assessment & Plan Note (Signed)
Healing

## 2015-11-05 NOTE — Assessment & Plan Note (Signed)
Chronic due to DM and HTN Stage 3 Discussed

## 2015-11-08 DIAGNOSIS — E11622 Type 2 diabetes mellitus with other skin ulcer: Secondary | ICD-10-CM | POA: Diagnosis not present

## 2015-11-08 DIAGNOSIS — L97211 Non-pressure chronic ulcer of right calf limited to breakdown of skin: Secondary | ICD-10-CM | POA: Diagnosis not present

## 2015-11-08 DIAGNOSIS — L97811 Non-pressure chronic ulcer of other part of right lower leg limited to breakdown of skin: Secondary | ICD-10-CM | POA: Diagnosis not present

## 2015-11-08 DIAGNOSIS — I10 Essential (primary) hypertension: Secondary | ICD-10-CM | POA: Diagnosis not present

## 2015-11-22 ENCOUNTER — Encounter (HOSPITAL_BASED_OUTPATIENT_CLINIC_OR_DEPARTMENT_OTHER): Payer: Medicare Other | Attending: Internal Medicine

## 2015-11-22 DIAGNOSIS — Z8631 Personal history of diabetic foot ulcer: Secondary | ICD-10-CM | POA: Diagnosis not present

## 2015-11-22 DIAGNOSIS — Z09 Encounter for follow-up examination after completed treatment for conditions other than malignant neoplasm: Secondary | ICD-10-CM | POA: Diagnosis not present

## 2015-11-22 DIAGNOSIS — D571 Sickle-cell disease without crisis: Secondary | ICD-10-CM | POA: Diagnosis not present

## 2015-11-22 DIAGNOSIS — E119 Type 2 diabetes mellitus without complications: Secondary | ICD-10-CM | POA: Insufficient documentation

## 2015-11-22 DIAGNOSIS — Z872 Personal history of diseases of the skin and subcutaneous tissue: Secondary | ICD-10-CM | POA: Diagnosis not present

## 2015-12-03 ENCOUNTER — Other Ambulatory Visit: Payer: Self-pay | Admitting: Internal Medicine

## 2016-02-14 DIAGNOSIS — M25562 Pain in left knee: Secondary | ICD-10-CM | POA: Diagnosis not present

## 2016-03-16 DIAGNOSIS — M1712 Unilateral primary osteoarthritis, left knee: Secondary | ICD-10-CM | POA: Diagnosis not present

## 2016-03-24 ENCOUNTER — Other Ambulatory Visit (INDEPENDENT_AMBULATORY_CARE_PROVIDER_SITE_OTHER): Payer: Medicare Other

## 2016-03-24 ENCOUNTER — Encounter: Payer: Self-pay | Admitting: Internal Medicine

## 2016-03-24 ENCOUNTER — Ambulatory Visit (INDEPENDENT_AMBULATORY_CARE_PROVIDER_SITE_OTHER): Payer: Medicare Other | Admitting: Internal Medicine

## 2016-03-24 DIAGNOSIS — I10 Essential (primary) hypertension: Secondary | ICD-10-CM | POA: Diagnosis not present

## 2016-03-24 DIAGNOSIS — N259 Disorder resulting from impaired renal tubular function, unspecified: Secondary | ICD-10-CM | POA: Diagnosis not present

## 2016-03-24 DIAGNOSIS — E119 Type 2 diabetes mellitus without complications: Secondary | ICD-10-CM | POA: Diagnosis not present

## 2016-03-24 DIAGNOSIS — E1121 Type 2 diabetes mellitus with diabetic nephropathy: Secondary | ICD-10-CM | POA: Diagnosis not present

## 2016-03-24 DIAGNOSIS — K219 Gastro-esophageal reflux disease without esophagitis: Secondary | ICD-10-CM

## 2016-03-24 LAB — BASIC METABOLIC PANEL
BUN: 27 mg/dL — AB (ref 6–23)
CO2: 25 mEq/L (ref 19–32)
CREATININE: 1.5 mg/dL — AB (ref 0.40–1.20)
Calcium: 9.9 mg/dL (ref 8.4–10.5)
Chloride: 108 mEq/L (ref 96–112)
GFR: 43.39 mL/min — AB (ref >60.00)
Glucose, Bld: 181 mg/dL — ABNORMAL HIGH (ref 70–99)
POTASSIUM: 4.8 meq/L (ref 3.5–5.1)
Sodium: 142 mEq/L (ref 135–145)

## 2016-03-24 LAB — HEMOGLOBIN A1C: HEMOGLOBIN A1C: 6.1 % (ref 4.6–6.5)

## 2016-03-24 NOTE — Assessment & Plan Note (Signed)
We may need to reduce Spironolactone

## 2016-03-24 NOTE — Progress Notes (Signed)
Pre visit review using our clinic review tool, if applicable. No additional management support is needed unless otherwise documented below in the visit note. 

## 2016-03-24 NOTE — Assessment & Plan Note (Signed)
Coreg, spironolactone

## 2016-03-24 NOTE — Progress Notes (Signed)
Subjective:  Patient ID: Brandy Gonzales, female    DOB: 1940/10/11  Age: 75 y.o. MRN: [id number]  CC: No chief complaint on file.   HPI Brandy Gonzales presents for HTN, OA, GERD f/u  Outpatient Medications Prior to Visit  Medication Sig Dispense Refill  . aspirin 81 MG EC tablet Take 81 mg by mouth daily.      . carvedilol (COREG) 25 MG tablet TAKE ONE TABLET BY MOUTH TWICE DAILY WITH  A  MEAL 60 tablet 11  . diclofenac sodium (VOLTAREN) 1 % GEL Apply 1 application topically 4 (four) times daily. 100 g 0  . glimepiride (AMARYL) 2 MG tablet TAKE ONE TABLET BY MOUTH BEFORE BREAKFAST - for diabetes 30 tablet 11  . ondansetron (ZOFRAN) 4 MG tablet Take 1 tablet (4 mg total) by mouth every 6 (six) hours. 12 tablet 0  . oxybutynin (DITROPAN) 5 MG tablet Take 1 tablet (5 mg total) by mouth 3 (three) times daily as needed for bladder spasms (urinary frequency). 90 tablet 3  . spironolactone (ALDACTONE) 50 MG tablet TAKE ONE TABLET BY MOUTH DAILY FOR  HIGH BLOOD PRESSURE 30 tablet 11  . traMADol-acetaminophen (ULTRACET) 37.5-325 MG tablet Take 1 tablet by mouth every 6 (six) hours as needed. 15 tablet 0  . glimepiride (AMARYL) 2 MG tablet TAKE ONE TABLET BY MOUTH BEFORE BREAKFAST 30 tablet 11  . spironolactone (ALDACTONE) 50 MG tablet TAKE ONE TABLET BY MOUTH DAILY FOR  HIGH BLOOD PRESSURE 30 tablet 0  . omeprazole (PRILOSEC) 20 MG capsule Take 1 capsule (20 mg total) by mouth 2 (two) times daily before a meal. For indigestion/acid 60 capsule 11   No facility-administered medications prior to visit.     ROS Review of Systems  Constitutional: Negative for activity change, appetite change, chills, fatigue and unexpected weight change.  HENT: Negative for congestion, mouth sores and sinus pressure.   Eyes: Negative for visual disturbance.  Respiratory: Negative for cough and chest tightness.   Gastrointestinal: Negative for abdominal pain and nausea.  Genitourinary: Negative for difficulty  urinating, frequency and vaginal pain.  Musculoskeletal: Positive for arthralgias and gait problem. Negative for back pain.  Skin: Negative for pallor and rash.  Neurological: Negative for dizziness, tremors, weakness, numbness and headaches.  Psychiatric/Behavioral: Negative for confusion and sleep disturbance.    Objective:  BP (!) 148/80   Pulse 75   Wt 142 lb (64.4 kg)   SpO2 98%   BMI 28.68 kg/m   BP Readings from Last 3 Encounters:  03/24/16 (!) 148/80  11/05/15 130/70  08/08/15 120/64    Wt Readings from Last 3 Encounters:  03/24/16 142 lb (64.4 kg)  11/05/15 143 lb (64.9 kg)  08/08/15 141 lb (64 kg)    Physical Exam  Constitutional: She appears well-developed. No distress.  HENT:  Head: Normocephalic.  Right Ear: External ear normal.  Left Ear: External ear normal.  Nose: Nose normal.  Mouth/Throat: Oropharynx is clear and moist.  Eyes: Conjunctivae are normal. Pupils are equal, round, and reactive to light. Right eye exhibits no discharge. Left eye exhibits no discharge.  Neck: Normal range of motion. Neck supple. No JVD present. No tracheal deviation present. No thyromegaly present.  Cardiovascular: Normal rate, regular rhythm and normal heart sounds.   Pulmonary/Chest: No stridor. No respiratory distress. She has no wheezes.  Abdominal: Soft. Bowel sounds are normal. She exhibits no distension and no mass. There is no tenderness. There is no rebound and no guarding.  Musculoskeletal: She exhibits tenderness. She exhibits no edema.  Lymphadenopathy:    She has no cervical adenopathy.  Neurological: She displays normal reflexes. No cranial nerve deficit. She exhibits normal muscle tone. Coordination abnormal.  Skin: No rash noted. No erythema.  Psychiatric: She has a normal mood and affect. Her behavior is normal. Judgment and thought content normal.  limping Knees tender Needs help to move to and from the exam table   Lab Results  Component Value Date    WBC 7.6 10/25/2015   HGB 12.4 10/25/2015   HCT 37.8 10/25/2015   PLT 154.0 10/25/2015   GLUCOSE 180 (H) 10/25/2015   CHOL 188 10/25/2015   TRIG 129.0 10/25/2015   HDL 39.40 10/25/2015   LDLDIRECT 78.9 10/22/2008   LDLCALC 123 (H) 10/25/2015   ALT 12 10/25/2015   AST 14 10/25/2015   NA 140 10/25/2015   K 4.6 10/25/2015   CL 108 10/25/2015   CREATININE 1.59 (H) 10/25/2015   BUN 34 (H) 10/25/2015   CO2 24 10/25/2015   TSH 2.69 10/25/2015   INR 1.03 08/16/2011   HGBA1C 6.1 10/25/2015   MICROALBUR 0.7 08/08/2015    No results found.  Assessment & Plan:   There are no diagnoses linked to this encounter. I am having Ms. Giannotti maintain her aspirin, glimepiride, omeprazole, ondansetron, diclofenac sodium, traMADol-acetaminophen, oxybutynin, spironolactone, and carvedilol.  No orders of the defined types were placed in this encounter.    Follow-up: No Follow-up on file.  Walker Kehr, MD

## 2016-03-24 NOTE — Assessment & Plan Note (Signed)
On Prilosec

## 2016-03-24 NOTE — Assessment & Plan Note (Signed)
Doing well 

## 2016-04-05 ENCOUNTER — Other Ambulatory Visit: Payer: Self-pay | Admitting: Internal Medicine

## 2016-04-13 DIAGNOSIS — M1712 Unilateral primary osteoarthritis, left knee: Secondary | ICD-10-CM | POA: Diagnosis not present

## 2016-05-02 ENCOUNTER — Other Ambulatory Visit: Payer: Self-pay | Admitting: Internal Medicine

## 2016-06-01 ENCOUNTER — Other Ambulatory Visit: Payer: Self-pay | Admitting: Internal Medicine

## 2016-07-22 ENCOUNTER — Encounter: Payer: Self-pay | Admitting: Internal Medicine

## 2016-07-22 ENCOUNTER — Ambulatory Visit (INDEPENDENT_AMBULATORY_CARE_PROVIDER_SITE_OTHER): Payer: Medicare Other | Admitting: Internal Medicine

## 2016-07-22 ENCOUNTER — Other Ambulatory Visit (INDEPENDENT_AMBULATORY_CARE_PROVIDER_SITE_OTHER): Payer: Medicare Other

## 2016-07-22 DIAGNOSIS — M79605 Pain in left leg: Secondary | ICD-10-CM

## 2016-07-22 DIAGNOSIS — E119 Type 2 diabetes mellitus without complications: Secondary | ICD-10-CM | POA: Diagnosis not present

## 2016-07-22 DIAGNOSIS — N259 Disorder resulting from impaired renal tubular function, unspecified: Secondary | ICD-10-CM | POA: Diagnosis not present

## 2016-07-22 DIAGNOSIS — I10 Essential (primary) hypertension: Secondary | ICD-10-CM | POA: Diagnosis not present

## 2016-07-22 DIAGNOSIS — E1121 Type 2 diabetes mellitus with diabetic nephropathy: Secondary | ICD-10-CM

## 2016-07-22 DIAGNOSIS — M79604 Pain in right leg: Secondary | ICD-10-CM | POA: Diagnosis not present

## 2016-07-22 LAB — HEMOGLOBIN A1C: HEMOGLOBIN A1C: 6.1 % (ref 4.6–6.5)

## 2016-07-22 LAB — BASIC METABOLIC PANEL
BUN: 18 mg/dL (ref 6–23)
CALCIUM: 10 mg/dL (ref 8.4–10.5)
CO2: 26 mEq/L (ref 19–32)
Chloride: 107 mEq/L (ref 96–112)
Creatinine, Ser: 1.39 mg/dL — ABNORMAL HIGH (ref 0.40–1.20)
GFR: 47.34 mL/min — ABNORMAL LOW (ref >60.00)
Glucose, Bld: 228 mg/dL — ABNORMAL HIGH (ref 70–99)
Potassium: 4.1 mEq/L (ref 3.5–5.1)
SODIUM: 141 meq/L (ref 135–145)

## 2016-07-22 NOTE — Assessment & Plan Note (Signed)
Labs Spironolactone Coreg

## 2016-07-22 NOTE — Progress Notes (Signed)
Pre-visit discussion using our clinic review tool. No additional management support is needed unless otherwise documented below in the visit note.  

## 2016-07-22 NOTE — Progress Notes (Signed)
Subjective:  Patient ID: Brandy Gonzales, female    DOB: July 01, 1940  Age: 76 y.o. MRN: [id number]  CC: Follow-up (HTN, DM type 2)   HPI Brandy Gonzales presents for HTN, DM, leg pain f/u. 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 aspirin 81 MG EC tablet Take 81 mg by mouth daily.      . carvedilol (COREG) 25 MG tablet TAKE ONE TABLET BY MOUTH TWICE DAILY WITH  A  MEAL 60 tablet 11  . diclofenac sodium (VOLTAREN) 1 % GEL Apply 1 application topically 4 (four) times daily. 100 g 0  . glimepiride (AMARYL) 2 MG tablet TAKE ONE TABLET BY MOUTH ONCE DAILY BEFORE  BREAKFAST. 90 tablet 2  . omeprazole (PRILOSEC) 20 MG capsule TAKE ONE CAPSULE BY MOUTH TWICE DAILY BEFORE  A  MEAL. 60 capsule 11  . ondansetron (ZOFRAN) 4 MG tablet Take 1 tablet (4 mg total) by mouth every 6 (six) hours. 12 tablet 0  . oxybutynin (DITROPAN) 5 MG tablet Take 1 tablet (5 mg total) by mouth 3 (three) times daily as needed for bladder spasms (urinary frequency). 90 tablet 3  . spironolactone (ALDACTONE) 50 MG tablet TAKE ONE TABLET BY MOUTH DAILY FOR  HIGH BLOOD PRESSURE 30 tablet 11  . traMADol-acetaminophen (ULTRACET) 37.5-325 MG tablet Take 1 tablet by mouth every 6 (six) hours as needed. 15 tablet 0  . glimepiride (AMARYL) 2 MG tablet TAKE ONE TABLET BY MOUTH BEFORE BREAKFAST - for diabetes 30 tablet 11  . omeprazole (PRILOSEC) 20 MG capsule Take 1 capsule (20 mg total) by mouth 2 (two) times daily before a meal. For indigestion/acid 60 capsule 11   No facility-administered medications prior to visit.     ROS Review of Systems  Constitutional: Negative for activity change, appetite change, chills, fatigue and unexpected weight change.  HENT: Negative for congestion, mouth sores and sinus pressure.   Eyes: Negative for visual disturbance.  Respiratory: Negative for cough and chest tightness.   Gastrointestinal: Negative for abdominal pain  and nausea.  Genitourinary: Negative for difficulty urinating, frequency and vaginal pain.  Musculoskeletal: Positive for arthralgias and gait problem. Negative for back pain.  Skin: Negative for pallor and rash.  Neurological: Negative for dizziness, tremors, weakness, numbness and headaches.  Psychiatric/Behavioral: Negative for confusion and sleep disturbance.    Objective:  BP 122/64   Pulse 70   Temp 97.7 F (36.5 C) (Oral)   Resp 16   Ht 4\' 11"  (1.499 m)   Wt 142 lb 8 oz (64.6 kg)   SpO2 97%   BMI 28.78 kg/m   BP Readings from Last 3 Encounters:  07/22/16 122/64  03/24/16 130/80  11/05/15 130/70    Wt Readings from Last 3 Encounters:  07/22/16 142 lb 8 oz (64.6 kg)  03/24/16 142 lb (64.4 kg)  11/05/15 143 lb (64.9 kg)    Physical Exam  Constitutional: She appears well-developed. No distress.  HENT:  Head: Normocephalic.  Right Ear: External ear normal.  Left Ear: External ear normal.  Nose: Nose normal.  Mouth/Throat: Oropharynx is clear and moist.  Eyes: Conjunctivae are normal. Pupils are equal, round, and reactive to light. Right eye exhibits no discharge. Left eye exhibits no discharge.  Neck: Normal range of motion. Neck supple. No JVD present. No tracheal deviation present. No thyromegaly present.  Cardiovascular: Normal rate, regular rhythm and normal heart sounds.  Pulmonary/Chest: No stridor. No respiratory distress. She has no wheezes.  Abdominal: Soft. Bowel sounds are normal. She exhibits no distension and no mass. There is no tenderness. There is no rebound and no guarding.  Musculoskeletal: She exhibits tenderness. She exhibits no edema.  Lymphadenopathy:    She has no cervical adenopathy.  Neurological: She displays normal reflexes. No cranial nerve deficit. She exhibits normal muscle tone. Coordination abnormal.  Skin: No rash noted. No erythema.  Psychiatric: She has a normal mood and affect. Her behavior is normal. Judgment and thought  content normal.  cane Knees tender L>R  Lab Results  Component Value Date   WBC 7.6 10/25/2015   HGB 12.4 10/25/2015   HCT 37.8 10/25/2015   PLT 154.0 10/25/2015   GLUCOSE 181 (H) 03/24/2016   CHOL 188 10/25/2015   TRIG 129.0 10/25/2015   HDL 39.40 10/25/2015   LDLDIRECT 78.9 10/22/2008   LDLCALC 123 (H) 10/25/2015   ALT 12 10/25/2015   AST 14 10/25/2015   NA 142 03/24/2016   K 4.8 03/24/2016   CL 108 03/24/2016   CREATININE 1.50 (H) 03/24/2016   BUN 27 (H) 03/24/2016   CO2 25 03/24/2016   TSH 2.69 10/25/2015   INR 1.03 08/16/2011   HGBA1C 6.1 03/24/2016   MICROALBUR 0.7 08/08/2015    No results found.  Assessment & Plan:   There are no diagnoses linked to this encounter. I am having Ms. Draughon maintain her aspirin, ondansetron, diclofenac sodium, traMADol-acetaminophen, oxybutynin, spironolactone, carvedilol, omeprazole, and glimepiride.  No orders of the defined types were placed in this encounter.    Follow-up: No Follow-up on file.  Walker Kehr, MD

## 2016-07-22 NOTE — Assessment & Plan Note (Addendum)
Form filled out for [employer] [facility] F/u w/Dr Rhona Raider

## 2016-07-22 NOTE — Assessment & Plan Note (Signed)
Labs Glimepiride

## 2016-08-04 DIAGNOSIS — H35373 Puckering of macula, bilateral: Secondary | ICD-10-CM | POA: Diagnosis not present

## 2016-08-04 DIAGNOSIS — H26493 Other secondary cataract, bilateral: Secondary | ICD-10-CM | POA: Diagnosis not present

## 2016-08-04 DIAGNOSIS — E119 Type 2 diabetes mellitus without complications: Secondary | ICD-10-CM | POA: Diagnosis not present

## 2016-08-04 DIAGNOSIS — H04123 Dry eye syndrome of bilateral lacrimal glands: Secondary | ICD-10-CM | POA: Diagnosis not present

## 2016-08-04 DIAGNOSIS — H5211 Myopia, right eye: Secondary | ICD-10-CM | POA: Diagnosis not present

## 2016-08-04 DIAGNOSIS — H524 Presbyopia: Secondary | ICD-10-CM | POA: Diagnosis not present

## 2016-08-04 DIAGNOSIS — H52221 Regular astigmatism, right eye: Secondary | ICD-10-CM | POA: Diagnosis not present

## 2016-08-20 ENCOUNTER — Encounter (HOSPITAL_COMMUNITY): Payer: Self-pay | Admitting: Emergency Medicine

## 2016-08-20 ENCOUNTER — Ambulatory Visit (INDEPENDENT_AMBULATORY_CARE_PROVIDER_SITE_OTHER): Payer: Medicare Other

## 2016-08-20 ENCOUNTER — Ambulatory Visit (HOSPITAL_COMMUNITY)
Admission: EM | Admit: 2016-08-20 | Discharge: 2016-08-20 | Disposition: A | Discharge status: Home / Self Care | Payer: Medicare Other | Attending: Family Medicine | Admitting: Family Medicine

## 2016-08-20 DIAGNOSIS — K589 Irritable bowel syndrome without diarrhea: Secondary | ICD-10-CM | POA: Diagnosis not present

## 2016-08-20 DIAGNOSIS — R300 Dysuria: Secondary | ICD-10-CM | POA: Diagnosis not present

## 2016-08-20 DIAGNOSIS — R1031 Right lower quadrant pain: Secondary | ICD-10-CM

## 2016-08-20 DIAGNOSIS — K5901 Slow transit constipation: Secondary | ICD-10-CM

## 2016-08-20 DIAGNOSIS — R140 Abdominal distension (gaseous): Secondary | ICD-10-CM | POA: Diagnosis not present

## 2016-08-20 DIAGNOSIS — R1032 Left lower quadrant pain: Secondary | ICD-10-CM | POA: Diagnosis not present

## 2016-08-20 NOTE — ED Triage Notes (Signed)
Here for UTI sx onset 2 days associated w/abd pain, urinary urgency/frequency  Denies fevers, chills  A&O 4... NAD

## 2016-08-20 NOTE — Discharge Instructions (Signed)
Its likely that your abdominal pain is due to a larger amount of stool in your colon. And you are having gas pain and colon spasms. Even though you have one small spot of tenderness in the left lower abdomen I do not think that you are having a diverticulitis attack at this time. If your pain gets worse and primarily is located in the left lower part of your abdomen, develop fever, vomiting, blood in the stool or worsening of any kind go directly to the emergency department. For now recommend taking a glass of MiraLAX twice a day until you have a good bowel movement. Follow-up with your primary care doctor tomorrow or early next week if needed.
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
## 2016-09-03 ENCOUNTER — Telehealth: Payer: Self-pay

## 2016-09-03 NOTE — Telephone Encounter (Signed)
I left a message asking the patient to confirm PCP. Duane Lope ([place])

## 2016-09-08 NOTE — Telephone Encounter (Signed)
The patient called me back and confirmed that Brandy Gonzales is her PCP whom she sees every 4 months. Duane Lope ([place])

## 2016-09-08 NOTE — Telephone Encounter (Signed)
2nd attempt to contact the patient.  I left a message on the home phone number. Brandy Gonzales ([place])

## 2016-10-09 ENCOUNTER — Other Ambulatory Visit: Payer: Self-pay | Admitting: Internal Medicine

## 2016-10-09 NOTE — Telephone Encounter (Signed)
Refill done.  

## 2016-10-13 ENCOUNTER — Ambulatory Visit (HOSPITAL_COMMUNITY)
Admission: EM | Admit: 2016-10-13 | Discharge: 2016-10-13 | Disposition: A | Discharge status: Home / Self Care | Payer: Medicare Other | Attending: Internal Medicine | Admitting: Internal Medicine

## 2016-10-13 ENCOUNTER — Encounter (HOSPITAL_COMMUNITY): Payer: Self-pay | Admitting: Emergency Medicine

## 2016-10-13 DIAGNOSIS — H9193 Unspecified hearing loss, bilateral: Secondary | ICD-10-CM

## 2016-10-13 DIAGNOSIS — R0981 Nasal congestion: Secondary | ICD-10-CM

## 2016-10-13 MED ORDER — TRIAMCINOLONE ACETONIDE 55 MCG/ACT NA AERO: 2.0000 | INHALATION_SPRAY | Freq: Every day | NASAL | 0 refills | Status: DC

## 2016-10-13 NOTE — ED Triage Notes (Signed)
The patient presented to the [facility] with a complaint of ear fullness.
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
## 2016-10-13 NOTE — Discharge Instructions (Addendum)
No significant ear wax accumulation in ears on exam today.  Will try treating sinus congestion to see if improvement in hearing occurs.  Prescription for nasal steroid spray (to help with sinus/ear congestion) sent to pharmacy.  If wig is covering ears, this may decrease hearing.  Followup with primary care provider to discuss further evaluation for hearing loss if this persists.

## 2016-11-25 ENCOUNTER — Other Ambulatory Visit (INDEPENDENT_AMBULATORY_CARE_PROVIDER_SITE_OTHER): Payer: Medicare Other

## 2016-11-25 DIAGNOSIS — N259 Disorder resulting from impaired renal tubular function, unspecified: Secondary | ICD-10-CM

## 2016-11-25 DIAGNOSIS — E119 Type 2 diabetes mellitus without complications: Secondary | ICD-10-CM

## 2016-11-25 LAB — BASIC METABOLIC PANEL
BUN: 24 mg/dL — ABNORMAL HIGH (ref 6–23)
CALCIUM: 10.3 mg/dL (ref 8.4–10.5)
CO2: 27 meq/L (ref 19–32)
Chloride: 110 mEq/L (ref 96–112)
Creatinine, Ser: 1.61 mg/dL — ABNORMAL HIGH (ref 0.40–1.20)
GFR: 39.92 mL/min — ABNORMAL LOW (ref >60.00)
GLUCOSE: 84 mg/dL (ref 70–99)
Potassium: 5.2 mEq/L — ABNORMAL HIGH (ref 3.5–5.1)
Sodium: 143 mEq/L (ref 135–145)

## 2016-11-25 LAB — HEMOGLOBIN A1C: Hgb A1c MFr Bld: 5.9 % (ref 4.6–6.5)

## 2016-11-26 ENCOUNTER — Encounter: Payer: Self-pay | Admitting: Internal Medicine

## 2016-11-26 ENCOUNTER — Ambulatory Visit (INDEPENDENT_AMBULATORY_CARE_PROVIDER_SITE_OTHER): Payer: Medicare Other | Admitting: Internal Medicine

## 2016-11-26 DIAGNOSIS — M172 Bilateral post-traumatic osteoarthritis of knee: Secondary | ICD-10-CM | POA: Diagnosis not present

## 2016-11-26 DIAGNOSIS — M79604 Pain in right leg: Secondary | ICD-10-CM

## 2016-11-26 DIAGNOSIS — M79605 Pain in left leg: Secondary | ICD-10-CM

## 2016-11-26 DIAGNOSIS — E1121 Type 2 diabetes mellitus with diabetic nephropathy: Secondary | ICD-10-CM

## 2016-11-26 DIAGNOSIS — I10 Essential (primary) hypertension: Secondary | ICD-10-CM | POA: Diagnosis not present

## 2016-11-26 MED ORDER — ZOSTER VAC RECOMB ADJUVANTED 50 MCG/0.5ML IM SUSR: 0.5000 mL | Freq: Once | INTRAMUSCULAR | 1 refills | Status: AC

## 2016-11-26 NOTE — Assessment & Plan Note (Signed)
C[place]

## 2016-11-26 NOTE — Assessment & Plan Note (Signed)
Coreg, Spironolactone Labs

## 2016-11-26 NOTE — Progress Notes (Signed)
Subjective:  Patient ID: Brandy Gonzales, female    DOB: Nov 11, 1940  Age: 76 y.o. MRN: [id number]  CC: No chief complaint on file.   HPI Brandy Gonzales presents for HTN, DM, OA f/u  Outpatient Medications Prior to Visit  Medication Sig Dispense Refill  . aspirin 81 MG EC tablet Take 81 mg by mouth daily.      . carvedilol (COREG) 25 MG tablet TAKE ONE TABLET BY MOUTH TWICE DAILY WITH  A  MEAL 60 tablet 11  . glimepiride (AMARYL) 2 MG tablet TAKE ONE TABLET BY MOUTH ONCE DAILY BEFORE  BREAKFAST. 90 tablet 2  . spironolactone (ALDACTONE) 50 MG tablet TAKE 1 TABLET BY MOUTH EVERY DAY 90 tablet 1  . diclofenac sodium (VOLTAREN) 1 % GEL Apply 1 application topically 4 (four) times daily. 100 g 0  . oxybutynin (DITROPAN) 5 MG tablet Take 1 tablet (5 mg total) by mouth 3 (three) times daily as needed for bladder spasms (urinary frequency). 90 tablet 3  . traMADol-acetaminophen (ULTRACET) 37.5-325 MG tablet Take 1 tablet by mouth every 6 (six) hours as needed. 15 tablet 0  . triamcinolone (NASACORT) 55 MCG/ACT AERO nasal inhaler Place 2 sprays into the nose daily. 1 Inhaler 0   No facility-administered medications prior to visit.     ROS Review of Systems  Constitutional: Negative for activity change, appetite change, chills, fatigue and unexpected weight change.  HENT: Negative for congestion, mouth sores and sinus pressure.   Eyes: Negative for visual disturbance.  Respiratory: Negative for cough and chest tightness.   Gastrointestinal: Negative for abdominal pain and nausea.  Genitourinary: Negative for difficulty urinating, frequency and vaginal pain.  Musculoskeletal: Positive for gait problem. Negative for back pain.  Skin: Negative for pallor and rash.  Neurological: Negative for dizziness, tremors, weakness, numbness and headaches.  Psychiatric/Behavioral: Negative for confusion, sleep disturbance and suicidal ideas.    Objective:  BP 128/68 (BP Location: Left Arm, Patient  Position: Sitting, Cuff Size: Normal)   Pulse 77   Temp 98.3 F (36.8 C) (Oral)   Ht 4\' 11"  (1.499 m)   Wt 141 lb (64 kg)   SpO2 100%   BMI 28.48 kg/m   BP Readings from Last 3 Encounters:  11/26/16 128/68  10/13/16 (!) 161/73  08/20/16 140/60    Wt Readings from Last 3 Encounters:  11/26/16 141 lb (64 kg)  07/22/16 142 lb 8 oz (64.6 kg)  03/24/16 142 lb (64.4 kg)    Physical Exam  Constitutional: She appears well-developed. No distress.  HENT:  Head: Normocephalic.  Right Ear: External ear normal.  Left Ear: External ear normal.  Nose: Nose normal.  Mouth/Throat: Oropharynx is clear and moist.  Eyes: Pupils are equal, round, and reactive to light. Conjunctivae are normal. Right eye exhibits no discharge. Left eye exhibits no discharge.  Neck: Normal range of motion. Neck supple. No JVD present. No tracheal deviation present. No thyromegaly present.  Cardiovascular: Normal rate, regular rhythm and normal heart sounds.   Pulmonary/Chest: No stridor. No respiratory distress. She has no wheezes.  Abdominal: Soft. Bowel sounds are normal. She exhibits no distension and no mass. There is no tenderness. There is no rebound and no guarding.  Musculoskeletal: She exhibits tenderness. She exhibits no edema.  Lymphadenopathy:    She has no cervical adenopathy.  Neurological: She displays normal reflexes. No cranial nerve deficit. She exhibits normal muscle tone. Coordination normal.  Skin: No rash noted. No erythema.  Psychiatric: She has  a normal mood and affect. Her behavior is normal. Judgment and thought content normal.  Cane  Lab Results  Component Value Date   WBC 7.6 10/25/2015   HGB 12.4 10/25/2015   HCT 37.8 10/25/2015   PLT 154.0 10/25/2015   GLUCOSE 84 11/25/2016   CHOL 188 10/25/2015   TRIG 129.0 10/25/2015   HDL 39.40 10/25/2015   LDLDIRECT 78.9 10/22/2008   LDLCALC 123 (H) 10/25/2015   ALT 12 10/25/2015   AST 14 10/25/2015   NA 143 11/25/2016   K 5.2 (H)  11/25/2016   CL 110 11/25/2016   CREATININE 1.61 (H) 11/25/2016   BUN 24 (H) 11/25/2016   CO2 27 11/25/2016   TSH 2.69 10/25/2015   INR 1.03 08/16/2011   HGBA1C 5.9 11/25/2016   MICROALBUR 0.7 08/08/2015    No results found.  Assessment & Plan:   There are no diagnoses linked to this encounter. I have discontinued Ms. Tuminello's diclofenac sodium, traMADol-acetaminophen, oxybutynin, and triamcinolone. I am also having her maintain her aspirin, carvedilol, glimepiride, and spironolactone.  No orders of the defined types were placed in this encounter.    Follow-up: No Follow-up on file.  Walker Kehr, MD

## 2016-11-26 NOTE — Patient Instructions (Signed)
MC well w/Jill 

## 2016-11-26 NOTE — Assessment & Plan Note (Signed)
Cane Tylenol

## 2016-11-26 NOTE — Assessment & Plan Note (Signed)
Glimepiride  Labs

## 2016-12-01 ENCOUNTER — Other Ambulatory Visit: Payer: Self-pay | Admitting: Internal Medicine

## 2016-12-04 ENCOUNTER — Other Ambulatory Visit: Payer: Self-pay | Admitting: Internal Medicine

## 2017-03-26 ENCOUNTER — Other Ambulatory Visit: Payer: Self-pay | Admitting: Internal Medicine

## 2017-03-29 ENCOUNTER — Other Ambulatory Visit (INDEPENDENT_AMBULATORY_CARE_PROVIDER_SITE_OTHER): Payer: Medicare Other

## 2017-03-29 DIAGNOSIS — N259 Disorder resulting from impaired renal tubular function, unspecified: Secondary | ICD-10-CM

## 2017-03-29 DIAGNOSIS — E119 Type 2 diabetes mellitus without complications: Secondary | ICD-10-CM | POA: Diagnosis not present

## 2017-03-29 LAB — BASIC METABOLIC PANEL
BUN: 23 mg/dL (ref 6–23)
CALCIUM: 10.1 mg/dL (ref 8.4–10.5)
CHLORIDE: 108 meq/L (ref 96–112)
CO2: 26 meq/L (ref 19–32)
CREATININE: 1.75 mg/dL — AB (ref 0.40–1.20)
GFR: 36.23 mL/min — ABNORMAL LOW (ref >60.00)
GLUCOSE: 81 mg/dL (ref 70–99)
Potassium: 4.7 mEq/L (ref 3.5–5.1)
Sodium: 141 mEq/L (ref 135–145)

## 2017-03-29 LAB — HEMOGLOBIN A1C: Hgb A1c MFr Bld: 6.1 % (ref 4.6–6.5)

## 2017-03-30 ENCOUNTER — Encounter: Payer: Self-pay | Admitting: Internal Medicine

## 2017-03-30 ENCOUNTER — Ambulatory Visit (INDEPENDENT_AMBULATORY_CARE_PROVIDER_SITE_OTHER): Payer: Medicare Other | Admitting: Internal Medicine

## 2017-03-30 DIAGNOSIS — I10 Essential (primary) hypertension: Secondary | ICD-10-CM | POA: Diagnosis not present

## 2017-03-30 DIAGNOSIS — E1121 Type 2 diabetes mellitus with diabetic nephropathy: Secondary | ICD-10-CM | POA: Diagnosis not present

## 2017-03-30 DIAGNOSIS — K219 Gastro-esophageal reflux disease without esophagitis: Secondary | ICD-10-CM | POA: Diagnosis not present

## 2017-03-30 MED ORDER — OMEPRAZOLE 20 MG PO CPDR: 20.0000 mg | DELAYED_RELEASE_CAPSULE | Freq: Every day | ORAL | 11 refills | Status: DC

## 2017-03-30 NOTE — Assessment & Plan Note (Signed)
Omeprazole

## 2017-03-30 NOTE — Assessment & Plan Note (Signed)
Coreg, Spironolactone 

## 2017-03-30 NOTE — Patient Instructions (Signed)
Elongated uvula

## 2017-03-30 NOTE — Assessment & Plan Note (Signed)
Glimepiride

## 2017-03-30 NOTE — Progress Notes (Signed)
Subjective:  Patient ID: Brandy Gonzales, female    DOB: [birth year]  Age: 76 y.o. MRN: [id number]  CC: No chief complaint on file.   HPI Brandy Gonzales presents for DM, OA, HTN f/u  Outpatient Medications Prior to Visit  Medication Sig Dispense Refill  . aspirin 81 MG EC tablet Take 81 mg by mouth daily.      . carvedilol (COREG) 25 MG tablet TAKE ONE TABLET BY MOUTH TWICE DAILY WITH A MEAL 60 tablet 11  . glimepiride (AMARYL) 2 MG tablet TAKE ONE TABLET BY MOUTH ONCE DAILY BEFORE  BREAKFAST. 90 tablet 2  . spironolactone (ALDACTONE) 50 MG tablet TAKE ONE TABLET BY MOUTH ONCE DAILY FOR HIGH BLOOD PRESSURE 30 tablet 11  . spironolactone (ALDACTONE) 50 MG tablet TAKE 1 TABLET BY MOUTH EVERY DAY 90 tablet 1   No facility-administered medications prior to visit.     ROS Review of Systems  Constitutional: Negative for activity change, appetite change, chills, fatigue and unexpected weight change.  HENT: Negative for congestion, mouth sores and sinus pressure.   Eyes: Negative for visual disturbance.  Respiratory: Negative for cough and chest tightness.   Gastrointestinal: Negative for abdominal pain and nausea.  Genitourinary: Negative for difficulty urinating, frequency and vaginal pain.  Musculoskeletal: Positive for gait problem. Negative for back pain.  Skin: Negative for pallor and rash.  Neurological: Negative for dizziness, tremors, weakness, numbness and headaches.  Psychiatric/Behavioral: Negative for confusion and sleep disturbance.    Objective:  BP 130/72 (BP Location: Left Arm, Patient Position: Sitting, Cuff Size: Normal)   Pulse 63   Temp 98 F (36.7 C) (Oral)   Ht 4\' 11"  (1.499 m)   Wt 140 lb (63.5 kg)   SpO2 99%   BMI 28.28 kg/m   BP Readings from Last 3 Encounters:  03/30/17 130/72  11/26/16 128/68  10/13/16 (!) 161/73    Wt Readings from Last 3 Encounters:  03/30/17 140 lb (63.5 kg)  11/26/16 141 lb (64 kg)  07/22/16 142 lb 8 oz (64.6 kg)     Physical Exam  Constitutional: She appears well-developed. No distress.  HENT:  Head: Normocephalic.  Right Ear: External ear normal.  Left Ear: External ear normal.  Nose: Nose normal.  Mouth/Throat: Oropharynx is clear and moist.  Eyes: Conjunctivae are normal. Pupils are equal, round, and reactive to light. Right eye exhibits no discharge. Left eye exhibits no discharge.  Neck: Normal range of motion. Neck supple. No JVD present. No tracheal deviation present. No thyromegaly present.  Cardiovascular: Normal rate, regular rhythm and normal heart sounds.  Pulmonary/Chest: No stridor. No respiratory distress. She has no wheezes.  Abdominal: Soft. Bowel sounds are normal. She exhibits no distension and no mass. There is no tenderness. There is no rebound and no guarding.  Musculoskeletal: She exhibits no edema or tenderness.  Lymphadenopathy:    She has no cervical adenopathy.  Neurological: She displays normal reflexes. No cranial nerve deficit. She exhibits normal muscle tone. Coordination abnormal.  Skin: No rash noted. No erythema.  Psychiatric: She has a normal mood and affect. Her behavior is normal. Judgment and thought content normal.  cane Elongated uvula - discussed   Lab Results  Component Value Date   WBC 7.6 10/25/2015   HGB 12.4 10/25/2015   HCT 37.8 10/25/2015   PLT 154.0 10/25/2015   GLUCOSE 81 03/29/2017   CHOL 188 10/25/2015   TRIG 129.0 10/25/2015   HDL 39.40 10/25/2015   LDLDIRECT 78.9 10/22/2008  LDLCALC 123 (H) 10/25/2015   ALT 12 10/25/2015   AST 14 10/25/2015   NA 141 03/29/2017   K 4.7 03/29/2017   CL 108 03/29/2017   CREATININE 1.75 (H) 03/29/2017   BUN 23 03/29/2017   CO2 26 03/29/2017   TSH 2.69 10/25/2015   INR 1.03 08/16/2011   HGBA1C 6.1 03/29/2017   MICROALBUR 0.7 08/08/2015    No results found.  Assessment & Plan:   There are no diagnoses linked to this encounter. I am having Brandy Gonzales maintain her aspirin, glimepiride,  carvedilol, and spironolactone.  No orders of the defined types were placed in this encounter.    Follow-up: No Follow-up on file.  Walker Kehr, MD

## 2017-05-01 ENCOUNTER — Other Ambulatory Visit: Payer: Self-pay | Admitting: Internal Medicine

## 2017-05-10 ENCOUNTER — Ambulatory Visit (HOSPITAL_COMMUNITY)
Admission: EM | Admit: 2017-05-10 | Discharge: 2017-05-10 | Disposition: A | Discharge status: Home / Self Care | Payer: Medicare Other | Attending: Emergency Medicine | Admitting: Emergency Medicine

## 2017-05-10 ENCOUNTER — Other Ambulatory Visit: Payer: Self-pay

## 2017-05-10 ENCOUNTER — Encounter (HOSPITAL_COMMUNITY): Payer: Self-pay | Admitting: Emergency Medicine

## 2017-05-10 DIAGNOSIS — K0889 Other specified disorders of teeth and supporting structures: Secondary | ICD-10-CM

## 2017-05-10 DIAGNOSIS — K029 Dental caries, unspecified: Secondary | ICD-10-CM | POA: Diagnosis not present

## 2017-05-10 MED ORDER — AMOXICILLIN 500 MG PO CAPS: 500.0000 mg | ORAL_CAPSULE | Freq: Three times a day (TID) | ORAL | 0 refills | Status: DC

## 2017-05-10 MED ORDER — TRAMADOL-ACETAMINOPHEN 37.5-325 MG PO TABS: 1.0000 | ORAL_TABLET | Freq: Four times a day (QID) | ORAL | 0 refills | Status: DC | PRN
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
## 2017-05-10 NOTE — Discharge Instructions (Signed)
See dentist as soon as possible. Take medications as directed.

## 2017-05-10 NOTE — ED Triage Notes (Signed)
Pt c/o upper right toothache since yesterday. Pt has poor dentition, does not see a dentist.

## 2017-07-27 ENCOUNTER — Other Ambulatory Visit (INDEPENDENT_AMBULATORY_CARE_PROVIDER_SITE_OTHER): Payer: Medicare Other

## 2017-07-27 DIAGNOSIS — N259 Disorder resulting from impaired renal tubular function, unspecified: Secondary | ICD-10-CM | POA: Diagnosis not present

## 2017-07-27 DIAGNOSIS — E119 Type 2 diabetes mellitus without complications: Secondary | ICD-10-CM | POA: Diagnosis not present

## 2017-07-27 LAB — BASIC METABOLIC PANEL
BUN: 21 mg/dL (ref 6–23)
CHLORIDE: 107 meq/L (ref 96–112)
CO2: 28 mEq/L (ref 19–32)
Calcium: 10.3 mg/dL (ref 8.4–10.5)
Creatinine, Ser: 1.79 mg/dL — ABNORMAL HIGH (ref 0.40–1.20)
GFR: 35.26 mL/min — AB (ref >60.00)
GLUCOSE: 135 mg/dL — AB (ref 70–99)
Potassium: 4.9 mEq/L (ref 3.5–5.1)
Sodium: 141 mEq/L (ref 135–145)

## 2017-07-27 LAB — HEMOGLOBIN A1C: HEMOGLOBIN A1C: 6.4 % (ref 4.6–6.5)

## 2017-07-28 ENCOUNTER — Ambulatory Visit (INDEPENDENT_AMBULATORY_CARE_PROVIDER_SITE_OTHER): Payer: Medicare Other | Admitting: Internal Medicine

## 2017-07-28 ENCOUNTER — Encounter: Payer: Self-pay | Admitting: Internal Medicine

## 2017-07-28 DIAGNOSIS — E1121 Type 2 diabetes mellitus with diabetic nephropathy: Secondary | ICD-10-CM

## 2017-07-28 DIAGNOSIS — I10 Essential (primary) hypertension: Secondary | ICD-10-CM | POA: Diagnosis not present

## 2017-07-28 DIAGNOSIS — M172 Bilateral post-traumatic osteoarthritis of knee: Secondary | ICD-10-CM

## 2017-07-28 NOTE — Patient Instructions (Signed)
Chronic Kidney Disease, Adult Chronic kidney disease (CKD) occurs when the kidneys become damaged slowly over a long period of time. The kidneys are a pair of organs that do many important jobs in the body, including:  Removing waste and extra fluid from the blood to make urine.  Making hormones that maintain the amount of fluid in tissues and blood vessels.  Maintaining the right amount of fluids and chemicals in the body.  A small amount of kidney damage may not cause problems, but a large amount of damage may make it hard or impossible for the kidneys to work the way they should. If steps are not taken to slow down kidney damage or to stop it from getting worse, the kidneys may stop working permanently (end-stage renal disease or ESRD). Most of the time, CKD does not go away, but it can often be controlled. People who have CKD are usually able to live normal lives. What are the causes? The most common causes of this condition are diabetes and high blood pressure (hypertension). Other causes include:  Heart and blood vessel (cardiovascular) disease.  Kidney diseases, such as: ? Glomerulonephritis. ? Interstitial nephritis. ? Polycystic kidney disease. ? Renal vascular disease.  Diseases that affect the immune system.  Genetic diseases.  Medicines that damage the kidneys, such as anti-inflammatory medicines.  Being around or being in contact with poisonous (toxic) substances.  A kidney or urinary infection that occurs again and again (recurs).  Vasculitis. This is swelling or inflammation of the blood vessels.  A problem with urine flow that may be caused by: ? Cancer. ? Having kidney stones more than one time. ? An enlarged prostate, in males.  What increases the risk? You are more likely to develop this condition if you:  Are older than age 60.  Are female.  Are African-American, Hispanic, Asian, Pacific Islander, or American Indian.  Are a current or former  smoker.  Are obese.  Have a family history of kidney disease or failure.  Often take medicines that are damaging to the kidneys.  What are the signs or symptoms? Symptoms of this condition include:  Swelling (edema) of the face, legs, ankles, or feet.  Tiredness (lethargy) and having less energy.  Nausea or vomiting.  Confusion or trouble concentrating.  Problems with urination, such as: ? Painful or burning feeling during urination. ? Decreased urine production. ? Frequent urination, especially at night. ? Bloody urine.  Muscle twitches and cramps, especially in the legs.  Shortness of breath.  Weakness.  Loss of appetite.  Metallic taste in the mouth.  Trouble sleeping.  Dry, itchy skin.  A low blood count (anemia).  Pale lining of the eyelids and surface of the eye (conjunctiva).  Symptoms develop slowly and may not be obvious until the kidney damage becomes severe. It is possible to have kidney disease for years without having any symptoms. How is this diagnosed? This condition may be diagnosed based on:  Blood tests.  Urine tests.  Imaging tests, such as an ultrasound or CT scan.  A test in which a sample of tissue is removed from the kidneys to be examined under a microscope (kidney biopsy).  These test results will help your health care provider determine how serious the CKD is. How is this treated? There is no cure for most cases of this condition, but treatment usually relieves symptoms and prevents or slows the progression of the disease. Treatment may include:  Making diet changes, which may require you to   avoid alcohol, salty foods (sodium), and foods that are high in potassium, calcium, and protein.  Medicines: ? To lower blood pressure. ? To control blood glucose. ? To relieve anemia. ? To relieve swelling. ? To protect your bones. ? To improve the balance of electrolytes in your blood.  Removing toxic waste from the body through  types of dialysis, if the kidneys can no longer do their job (kidney failure).  Managing any other conditions that are causing your CKD or making it worse.  Follow these instructions at home: Medicines  Take over-the-counter and prescription medicines only as told by your health care provider. The dose of some medicines that you take may need to be adjusted.  Do not take any new medicines unless approved by your health care provider. Many medicines can worsen your kidney damage.  Do not take any vitamin and mineral supplements unless approved by your health care provider. Many nutritional supplements can worsen your kidney damage. General instructions  Follow your prescribed diet as told by your health care provider.  Do not use any products that contain nicotine or tobacco, such as cigarettes and e-cigarettes. If you need help quitting, ask your health care provider.  Monitor and track your blood pressure at home. Report changes in your blood pressure as told by your health care provider.  If you are being treated for diabetes, monitor and track your blood sugar (blood glucose) levels as told by your health care provider.  Maintain a healthy weight. If you need help with this, ask your health care provider.  Start or continue an exercise plan. Exercise at least 30 minutes a day, 5 days a week.  Keep your immunizations up to date as told by your health care provider.  Keep all follow-up visits as told by your health care provider. This is important. Where to find more information:  American Association of Kidney Patients: www.aakp.org  National Kidney Foundation: www.kidney.org  American Kidney Fund: www.akfinc.org  [facility]: www.lifeoptions.org and www.kidneyschool.org Contact a health care provider if:  Your symptoms get worse.  You develop new symptoms. Get help right away if:  You develop symptoms of ESRD, which  include: ? Headaches. ? Numbness in the hands or feet. ? Easy bruising. ? Frequent hiccups. ? Chest pain. ? Shortness of breath. ? Lack of menstruation, in women.  You have a fever.  You have decreased urine production.  You have pain or bleeding when you urinate. Summary  Chronic kidney disease (CKD) occurs when the kidneys become damaged slowly over a long period of time.  The most common causes of this condition are diabetes and high blood pressure (hypertension).  There is no cure for most cases of this condition, but treatment usually relieves symptoms and prevents or slows the progression of the disease. Treatment may include a combination of medicines and lifestyle changes. This information is not intended to replace advice given to you by your health care provider. Make sure you discuss any questions you have with your health care provider. Document Released: 02/04/2008 Document Revised: 06/04/2016 Document Reviewed: 06/04/2016 Elsevier Interactive Patient Education  2018 [employer]  

## 2017-07-28 NOTE — Assessment & Plan Note (Signed)
Glimeperide 

## 2017-07-28 NOTE — Assessment & Plan Note (Signed)
C[place]

## 2017-07-28 NOTE — Progress Notes (Signed)
Subjective:  Patient ID: Brandy Gonzales, female    DOB: 1941/04/24  Age: 77 y.o. MRN: [id number]  CC: No chief complaint on file.   HPI Brandy Gonzales presents for HTN, OA, DM f/u  Outpatient Medications Prior to Visit  Medication Sig Dispense Refill  . aspirin 81 MG EC tablet Take 81 mg by mouth daily.      . carvedilol (COREG) 25 MG tablet TAKE ONE TABLET BY MOUTH TWICE DAILY WITH A MEAL 60 tablet 11  . glimepiride (AMARYL) 2 MG tablet TAKE ONE TABLET BY MOUTH ONCE DAILY BEFORE  BREAKFAST 90 tablet 3  . omeprazole (PRILOSEC) 20 MG capsule Take 1 capsule (20 mg total) by mouth daily. 30 capsule 11  . spironolactone (ALDACTONE) 50 MG tablet TAKE ONE TABLET BY MOUTH ONCE DAILY FOR HIGH BLOOD PRESSURE 30 tablet 11  . traMADol-acetaminophen (ULTRACET) 37.5-325 MG tablet Take 1 tablet by mouth every 6 (six) hours as needed. 15 tablet 0  . amoxicillin (AMOXIL) 500 MG capsule Take 1 capsule (500 mg total) by mouth 3 (three) times daily. 21 capsule 0   No facility-administered medications prior to visit.     ROS Review of Systems  Constitutional: Negative for activity change, appetite change, chills, fatigue and unexpected weight change.  HENT: Negative for congestion, mouth sores and sinus pressure.   Eyes: Negative for visual disturbance.  Respiratory: Negative for cough and chest tightness.   Gastrointestinal: Negative for abdominal pain and nausea.  Genitourinary: Negative for difficulty urinating, frequency and vaginal pain.  Musculoskeletal: Positive for arthralgias and gait problem. Negative for back pain.  Skin: Negative for pallor and rash.  Neurological: Negative for dizziness, tremors, weakness, numbness and headaches.  Psychiatric/Behavioral: Negative for confusion and sleep disturbance.    Objective:  BP 128/72 (BP Location: Left Arm, Patient Position: Sitting, Cuff Size: Normal)   Pulse 66   Temp 98.3 F (36.8 C) (Oral)   Ht 4\' 11"  (1.499 m)   Wt 143 lb (64.9 kg)    SpO2 98%   BMI 28.88 kg/m   BP Readings from Last 3 Encounters:  07/28/17 128/72  05/10/17 (!) 180/74  03/30/17 130/72    Wt Readings from Last 3 Encounters:  07/28/17 143 lb (64.9 kg)  03/30/17 140 lb (63.5 kg)  11/26/16 141 lb (64 kg)    Physical Exam  Constitutional: She appears well-developed. No distress.  HENT:  Head: Normocephalic.  Right Ear: External ear normal.  Left Ear: External ear normal.  Nose: Nose normal.  Mouth/Throat: Oropharynx is clear and moist.  Eyes: Conjunctivae are normal. Pupils are equal, round, and reactive to light. Right eye exhibits no discharge. Left eye exhibits no discharge.  Neck: Normal range of motion. Neck supple. No JVD present. No tracheal deviation present. No thyromegaly present.  Cardiovascular: Normal rate, regular rhythm and normal heart sounds.  Pulmonary/Chest: No stridor. No respiratory distress. She has no wheezes.  Abdominal: Soft. Bowel sounds are normal. She exhibits no distension and no mass. There is no tenderness. There is no rebound and no guarding.  Musculoskeletal: She exhibits tenderness. She exhibits no edema.  Lymphadenopathy:    She has no cervical adenopathy.  Neurological: She displays normal reflexes. No cranial nerve deficit. She exhibits normal muscle tone. Coordination abnormal.  Skin: No rash noted. No erythema.  Psychiatric: She has a normal mood and affect. Her behavior is normal. Judgment and thought content normal.  [place]  Lab Results  Component Value Date   WBC 7.6  10/25/2015   HGB 12.4 10/25/2015   HCT 37.8 10/25/2015   PLT 154.0 10/25/2015   GLUCOSE 135 (H) 07/27/2017   CHOL 188 10/25/2015   TRIG 129.0 10/25/2015   HDL 39.40 10/25/2015   LDLDIRECT 78.9 10/22/2008   LDLCALC 123 (H) 10/25/2015   ALT 12 10/25/2015   AST 14 10/25/2015   NA 141 07/27/2017   K 4.9 07/27/2017   CL 107 07/27/2017   CREATININE 1.79 (H) 07/27/2017   BUN 21 07/27/2017   CO2 28 07/27/2017   TSH 2.69 10/25/2015    INR 1.03 08/16/2011   HGBA1C 6.4 07/27/2017   MICROALBUR 0.7 08/08/2015    No results found.  Assessment & Plan:   There are no diagnoses linked to this encounter. I have discontinued Brandy Gonzales. Majano's amoxicillin. I am also having her maintain her aspirin, carvedilol, spironolactone, omeprazole, glimepiride, and traMADol-acetaminophen.  No orders of the defined types were placed in this encounter.    Follow-up: No Follow-up on file.  Walker Kehr, MD

## 2017-07-28 NOTE — Assessment & Plan Note (Signed)
On Coreg, Spironolactone 

## 2017-07-28 NOTE — Assessment & Plan Note (Addendum)
Labs US 

## 2017-08-05 DIAGNOSIS — H35373 Puckering of macula, bilateral: Secondary | ICD-10-CM | POA: Diagnosis not present

## 2017-08-05 DIAGNOSIS — H04123 Dry eye syndrome of bilateral lacrimal glands: Secondary | ICD-10-CM | POA: Diagnosis not present

## 2017-08-05 DIAGNOSIS — H26493 Other secondary cataract, bilateral: Secondary | ICD-10-CM | POA: Diagnosis not present

## 2017-08-05 DIAGNOSIS — H524 Presbyopia: Secondary | ICD-10-CM | POA: Diagnosis not present

## 2017-08-05 DIAGNOSIS — H5211 Myopia, right eye: Secondary | ICD-10-CM | POA: Diagnosis not present

## 2017-08-05 DIAGNOSIS — Z961 Presence of intraocular lens: Secondary | ICD-10-CM | POA: Diagnosis not present

## 2017-08-05 DIAGNOSIS — H52221 Regular astigmatism, right eye: Secondary | ICD-10-CM | POA: Diagnosis not present

## 2017-08-05 LAB — HM DIABETES EYE EXAM

## 2017-08-12 NOTE — Addendum Note (Signed)
Addended by: Karren Cobble on: 08/12/2017 08:39 AM   Modules accepted: Orders

## 2017-09-07 ENCOUNTER — Ambulatory Visit
Admission: RE | Admit: 2017-09-07 | Discharge: 2017-09-07 | Disposition: A | Discharge status: Home / Self Care | Payer: Medicare Other | Source: Ambulatory Visit | Attending: Internal Medicine | Admitting: Internal Medicine

## 2017-09-07 DIAGNOSIS — E1121 Type 2 diabetes mellitus with diabetic nephropathy: Secondary | ICD-10-CM

## 2017-09-07 DIAGNOSIS — N281 Cyst of kidney, acquired: Secondary | ICD-10-CM | POA: Diagnosis not present

## 2017-09-09 ENCOUNTER — Telehealth: Payer: Self-pay | Admitting: Internal Medicine

## 2017-09-09 NOTE — Telephone Encounter (Signed)
Patient called, no answer, no machine to leave message, results available in result notes.

## 2017-09-09 NOTE — Telephone Encounter (Signed)
Copied from [place]. Topic: General - Other >> Sep 09, 2017  1:05 PM Yvette Rack wrote: Reason for CRM: patient calling about U/S results

## 2017-09-30 ENCOUNTER — Encounter (HOSPITAL_COMMUNITY): Payer: Self-pay | Admitting: Emergency Medicine

## 2017-09-30 ENCOUNTER — Ambulatory Visit (HOSPITAL_COMMUNITY)
Admission: EM | Admit: 2017-09-30 | Discharge: 2017-09-30 | Disposition: A | Discharge status: Home / Self Care | Payer: Medicare Other | Attending: Emergency Medicine | Admitting: Emergency Medicine

## 2017-09-30 DIAGNOSIS — K0889 Other specified disorders of teeth and supporting structures: Secondary | ICD-10-CM

## 2017-09-30 MED ORDER — AMOXICILLIN-POT CLAVULANATE 875-125 MG PO TABS: 1.0000 | ORAL_TABLET | Freq: Two times a day (BID) | ORAL | 0 refills | Status: DC

## 2017-09-30 MED ORDER — TRAMADOL HCL 50 MG PO TABS: 50.0000 mg | ORAL_TABLET | Freq: Two times a day (BID) | ORAL | 0 refills | Status: AC | PRN

## 2017-09-30 NOTE — Discharge Instructions (Signed)
Please use dental resource to contact offices to seek permenant treatment/relief.   Today we have given you an antibiotic. This should help with pain as any infection is cleared.   For pain please take with 1000 mg of Tylenol Extra strength every 8 hours. These are safe to take together. Please take with food.   I have also provided 2 days worth of stronger pain medication. This should only be used for severe pain. Do not drive or operate machinery while taking this medication.   Please return if you start to experience significant swelling of your face, experiencing fever.
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
## 2017-09-30 NOTE — ED Triage Notes (Signed)
PT reports dental pain and swelling for a few days.

## 2017-11-28 ENCOUNTER — Other Ambulatory Visit: Payer: Self-pay | Admitting: Internal Medicine

## 2017-11-30 ENCOUNTER — Ambulatory Visit (INDEPENDENT_AMBULATORY_CARE_PROVIDER_SITE_OTHER): Payer: Medicare Other | Admitting: Internal Medicine

## 2017-11-30 ENCOUNTER — Encounter: Payer: Self-pay | Admitting: Internal Medicine

## 2017-11-30 ENCOUNTER — Other Ambulatory Visit (INDEPENDENT_AMBULATORY_CARE_PROVIDER_SITE_OTHER): Payer: Medicare Other

## 2017-11-30 DIAGNOSIS — R55 Syncope and collapse: Secondary | ICD-10-CM | POA: Diagnosis not present

## 2017-11-30 DIAGNOSIS — N183 Chronic kidney disease, stage 3 unspecified: Secondary | ICD-10-CM

## 2017-11-30 DIAGNOSIS — M172 Bilateral post-traumatic osteoarthritis of knee: Secondary | ICD-10-CM

## 2017-11-30 DIAGNOSIS — N259 Disorder resulting from impaired renal tubular function, unspecified: Secondary | ICD-10-CM | POA: Diagnosis not present

## 2017-11-30 DIAGNOSIS — E1121 Type 2 diabetes mellitus with diabetic nephropathy: Secondary | ICD-10-CM

## 2017-11-30 DIAGNOSIS — E119 Type 2 diabetes mellitus without complications: Secondary | ICD-10-CM

## 2017-11-30 DIAGNOSIS — I10 Essential (primary) hypertension: Secondary | ICD-10-CM

## 2017-11-30 DIAGNOSIS — N1832 Chronic kidney disease, stage 3b: Secondary | ICD-10-CM | POA: Insufficient documentation

## 2017-11-30 DIAGNOSIS — N184 Chronic kidney disease, stage 4 (severe): Secondary | ICD-10-CM | POA: Insufficient documentation

## 2017-11-30 LAB — BASIC METABOLIC PANEL
BUN: 22 mg/dL (ref 6–23)
CO2: 23 mEq/L (ref 19–32)
Calcium: 9.9 mg/dL (ref 8.4–10.5)
Chloride: 112 mEq/L (ref 96–112)
Creatinine, Ser: 1.68 mg/dL — ABNORMAL HIGH (ref 0.40–1.20)
GFR: 37.91 mL/min — AB (ref >60.00)
GLUCOSE: 111 mg/dL — AB (ref 70–99)
POTASSIUM: 4.3 meq/L (ref 3.5–5.1)
SODIUM: 144 meq/L (ref 135–145)

## 2017-11-30 LAB — HEMOGLOBIN A1C: Hgb A1c MFr Bld: 6.1 % (ref 4.6–6.5)

## 2017-11-30 MED ORDER — OMEPRAZOLE 20 MG PO CPDR: 20.0000 mg | DELAYED_RELEASE_CAPSULE | Freq: Every day | ORAL | 3 refills | Status: DC

## 2017-11-30 MED ORDER — CARVEDILOL 25 MG PO TABS: 25.0000 mg | ORAL_TABLET | Freq: Two times a day (BID) | ORAL | 3 refills | Status: DC

## 2017-11-30 MED ORDER — VITAMIN D 1000 UNITS PO TABS: 1000.0000 [IU] | ORAL_TABLET | Freq: Every day | ORAL | 11 refills | Status: AC

## 2017-11-30 MED ORDER — SPIRONOLACTONE 50 MG PO TABS: ORAL_TABLET | ORAL | 3 refills | Status: DC
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
## 2017-11-30 NOTE — Assessment & Plan Note (Signed)
Tylenol.

## 2017-11-30 NOTE — Assessment & Plan Note (Signed)
No relapse 

## 2017-11-30 NOTE — Assessment & Plan Note (Signed)
Coreg, Spironolactone

## 2017-11-30 NOTE — Assessment & Plan Note (Addendum)
We discussed diabetic nephropathy, meds Monitor labs May need to reduce amaryl due to CRI

## 2017-11-30 NOTE — Assessment & Plan Note (Signed)
Labs

## 2017-11-30 NOTE — Patient Instructions (Addendum)
MC well w/Jill Elastic brace for left ankle

## 2017-12-01 ENCOUNTER — Ambulatory Visit: Payer: Medicare Other | Admitting: Internal Medicine

## 2017-12-29 ENCOUNTER — Ambulatory Visit (HOSPITAL_COMMUNITY)
Admission: EM | Admit: 2017-12-29 | Discharge: 2017-12-29 | Disposition: A | Discharge status: Home / Self Care | Payer: Medicare Other | Attending: Family Medicine | Admitting: Family Medicine

## 2017-12-29 ENCOUNTER — Encounter (HOSPITAL_COMMUNITY): Payer: Self-pay | Admitting: Emergency Medicine

## 2017-12-29 DIAGNOSIS — T161XXA Foreign body in right ear, initial encounter: Secondary | ICD-10-CM | POA: Diagnosis not present

## 2017-12-29 NOTE — ED Triage Notes (Signed)
Pt states she has qtip cotton stuck in her R ear since last night.
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
## 2018-01-07 ENCOUNTER — Encounter (HOSPITAL_COMMUNITY): Payer: Self-pay

## 2018-01-07 ENCOUNTER — Ambulatory Visit (HOSPITAL_COMMUNITY)
Admission: EM | Admit: 2018-01-07 | Discharge: 2018-01-07 | Disposition: A | Discharge status: Home / Self Care | Payer: Medicare Other | Attending: Internal Medicine | Admitting: Internal Medicine

## 2018-01-07 DIAGNOSIS — J040 Acute laryngitis: Secondary | ICD-10-CM | POA: Diagnosis not present

## 2018-01-07 MED ORDER — CETIRIZINE HCL 5 MG PO TABS: 5.0000 mg | ORAL_TABLET | Freq: Every day | ORAL | 0 refills | Status: DC

## 2018-01-07 MED ORDER — PREDNISONE 10 MG (21) PO TBPK: ORAL_TABLET | Freq: Every day | ORAL | 0 refills | Status: DC

## 2018-01-07 NOTE — ED Triage Notes (Signed)
Pt presents with laryngitis x 1 week. Denies any pain or any other symptoms.

## 2018-01-07 NOTE — Discharge Instructions (Addendum)
Start prednisone as directed to help with inflammation. Start zyrtec to help with any allergy symptoms that could be causing these symptoms. If symptoms still not resolving, please follow up with your PCP for further evaluation needed.
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
## 2018-01-11 ENCOUNTER — Other Ambulatory Visit: Payer: Self-pay | Admitting: *Deleted

## 2018-01-11 MED ORDER — SPIRONOLACTONE 50 MG PO TABS
ORAL_TABLET | ORAL | 0 refills | Status: DC
Start: 2018-01-11 — End: 2019-01-09

## 2018-01-19 ENCOUNTER — Other Ambulatory Visit (INDEPENDENT_AMBULATORY_CARE_PROVIDER_SITE_OTHER): Payer: Medicare Other

## 2018-01-19 ENCOUNTER — Ambulatory Visit (INDEPENDENT_AMBULATORY_CARE_PROVIDER_SITE_OTHER): Payer: Medicare Other | Admitting: *Deleted

## 2018-01-19 VITALS — BP 118/64 | HR 73 | Resp 19 | Ht 59.0 in | Wt 141.0 lb

## 2018-01-19 DIAGNOSIS — Z0000 Encounter for general adult medical examination without abnormal findings: Secondary | ICD-10-CM | POA: Diagnosis not present

## 2018-01-19 DIAGNOSIS — N259 Disorder resulting from impaired renal tubular function, unspecified: Secondary | ICD-10-CM | POA: Diagnosis not present

## 2018-01-19 DIAGNOSIS — E119 Type 2 diabetes mellitus without complications: Secondary | ICD-10-CM

## 2018-01-19 LAB — HEMOGLOBIN A1C: HEMOGLOBIN A1C: 7.2 % — AB (ref 4.6–6.5)

## 2018-01-19 LAB — BASIC METABOLIC PANEL
BUN: 30 mg/dL — ABNORMAL HIGH (ref 6–23)
CALCIUM: 9.6 mg/dL (ref 8.4–10.5)
CHLORIDE: 107 meq/L (ref 96–112)
CO2: 24 mEq/L (ref 19–32)
CREATININE: 1.56 mg/dL — AB (ref 0.40–1.20)
GFR: 41.28 mL/min — AB (ref >60.00)
Glucose, Bld: 304 mg/dL — ABNORMAL HIGH (ref 70–99)
Potassium: 5 mEq/L (ref 3.5–5.1)
Sodium: 137 mEq/L (ref 135–145)

## 2018-01-19 LAB — HM DIABETES EYE EXAM

## 2018-01-19 NOTE — Patient Instructions (Addendum)
Continue doing brain stimulating activities (puzzles, reading, adult coloring books, staying active) to keep memory sharp.   Continue to eat heart healthy diet (full of fruits, vegetables, whole grains, lean protein, water--limit salt, fat, and sugar intake) and increase physical activity as tolerated.   Brandy Gonzales , Thank you for taking time to come for your Medicare Wellness Visit. I appreciate your ongoing commitment to your health goals. Please review the following plan we discussed and let me know if I can assist you in the future.   These are the goals we discussed: Goals    . Patient Stated     Maintain current healthy status.        This is a list of the screening recommended for you and due dates:  Health Maintenance  Topic Date Due  . DEXA scan (bone density measurement)  01/02/2005  . Eye exam for diabetics  07/31/2016  . Urine Protein Check  08/07/2016  . Flu Shot  12/09/2017  . Hemoglobin A1C  06/02/2018  . Complete foot exam   12/01/2018  . Tetanus Vaccine  10/31/2024  . Pneumonia vaccines  Completed   Health Maintenance, Female Adopting a healthy lifestyle and getting preventive care can go a long way to promote health and wellness. Talk with your health care provider about what schedule of regular examinations is right for you. This is a good chance for you to check in with your provider about disease prevention and staying healthy. In between checkups, there are plenty of things you can do on your own. Experts have done a lot of research about which lifestyle changes and preventive measures are most likely to keep you healthy. Ask your health care provider for more information. Weight and diet Eat a healthy diet  Be sure to include plenty of vegetables, fruits, low-fat dairy products, and lean protein.  Do not eat a lot of foods high in solid fats, added sugars, or salt.  Get regular exercise. This is one of the most important things you can do for your  health. ? Most adults should exercise for at least 150 minutes each week. The exercise should increase your heart rate and make you sweat (moderate-intensity exercise). ? Most adults should also do strengthening exercises at least twice a week. This is in addition to the moderate-intensity exercise.  Maintain a healthy weight  Body mass index (BMI) is a measurement that can be used to identify possible weight problems. It estimates body fat based on height and weight. Your health care provider can help determine your BMI and help you achieve or maintain a healthy weight.  For females 23 years of age and older: ? A BMI below 18.5 is considered underweight. ? A BMI of 18.5 to 24.9 is normal. ? A BMI of 25 to 29.9 is considered overweight. ? A BMI of 30 and above is considered obese.  Watch levels of cholesterol and blood lipids  You should start having your blood tested for lipids and cholesterol at 78 years of age, then have this test every 5 years.  You may need to have your cholesterol levels checked more often if: ? Your lipid or cholesterol levels are high. ? You are older than 78 years of age. ? You are at high risk for heart disease.  Cancer screening Lung Cancer  Lung cancer screening is recommended for adults 52-20 years old who are at high risk for lung cancer because of a history of smoking.  A yearly low-dose CT  scan of the lungs is recommended for people who: ? Currently smoke. ? Have quit within the past 15 years. ? Have at least a 30-pack-year history of smoking. A pack year is smoking an average of one pack of cigarettes a day for 1 year.  Yearly screening should continue until it has been 15 years since you quit.  Yearly screening should stop if you develop a health problem that would prevent you from having lung cancer treatment.  Breast Cancer  Practice breast self-awareness. This means understanding how your breasts normally appear and feel.  It also means  doing regular breast self-exams. Let your health care provider know about any changes, no matter how small.  If you are in your 20s or 30s, you should have a clinical breast exam (CBE) by a health care provider every 1-3 years as part of a regular health exam.  If you are 66 or older, have a CBE every year. Also consider having a breast X-ray (mammogram) every year.  If you have a family history of breast cancer, talk to your health care provider about genetic screening.  If you are at high risk for breast cancer, talk to your health care provider about having an MRI and a mammogram every year.  Breast cancer gene (BRCA) assessment is recommended for women who have family members with BRCA-related cancers. BRCA-related cancers include: ? Breast. ? Ovarian. ? Tubal. ? Peritoneal cancers.  Results of the assessment will determine the need for genetic counseling and BRCA1 and BRCA2 testing.  Cervical Cancer Your health care provider may recommend that you be screened regularly for cancer of the pelvic organs (ovaries, uterus, and vagina). This screening involves a pelvic examination, including checking for microscopic changes to the surface of your cervix (Pap test). You may be encouraged to have this screening done every 3 years, beginning at age 73.  For women ages 19-65, health care providers may recommend pelvic exams and Pap testing every 3 years, or they may recommend the Pap and pelvic exam, combined with testing for human papilloma virus (HPV), every 5 years. Some types of HPV increase your risk of cervical cancer. Testing for HPV may also be done on women of any age with unclear Pap test results.  Other health care providers may not recommend any screening for nonpregnant women who are considered low risk for pelvic cancer and who do not have symptoms. Ask your health care provider if a screening pelvic exam is right for you.  If you have had past treatment for cervical cancer or a  condition that could lead to cancer, you need Pap tests and screening for cancer for at least 20 years after your treatment. If Pap tests have been discontinued, your risk factors (such as having a new sexual partner) need to be reassessed to determine if screening should resume. Some women have medical problems that increase the chance of getting cervical cancer. In these cases, your health care provider may recommend more frequent screening and Pap tests.  Colorectal Cancer  This type of cancer can be detected and often prevented.  Routine colorectal cancer screening usually begins at 78 years of age and continues through 78 years of age.  Your health care provider may recommend screening at an earlier age if you have risk factors for colon cancer.  Your health care provider may also recommend using home test kits to check for hidden blood in the stool.  A small camera at the end of a tube can  be used to examine your colon directly (sigmoidoscopy or colonoscopy). This is done to check for the earliest forms of colorectal cancer.  Routine screening usually begins at age 11.  Direct examination of the colon should be repeated every 5-10 years through 78 years of age. However, you may need to be screened more often if early forms of precancerous polyps or small growths are found.  Skin Cancer  Check your skin from head to toe regularly.  Tell your health care provider about any new moles or changes in moles, especially if there is a change in a mole's shape or color.  Also tell your health care provider if you have a mole that is larger than the size of a pencil eraser.  Always use sunscreen. Apply sunscreen liberally and repeatedly throughout the day.  Protect yourself by wearing long sleeves, pants, a wide-brimmed hat, and sunglasses whenever you are outside.  Heart disease, diabetes, and high blood pressure  High blood pressure causes heart disease and increases the risk of stroke.  High blood pressure is more likely to develop in: ? People who have blood pressure in the high end of the normal range (130-139/85-89 mm Hg). ? People who are overweight or obese. ? People who are African American.  If you are 61-26 years of age, have your blood pressure checked every 3-5 years. If you are 53 years of age or older, have your blood pressure checked every year. You should have your blood pressure measured twice-once when you are at a hospital or clinic, and once when you are not at a hospital or clinic. Record the average of the two measurements. To check your blood pressure when you are not at a hospital or clinic, you can use: ? An automated blood pressure machine at a pharmacy. ? A home blood pressure monitor.  If you are between 51 years and 58 years old, ask your health care provider if you should take aspirin to prevent strokes.  Have regular diabetes screenings. This involves taking a blood sample to check your fasting blood sugar level. ? If you are at a normal weight and have a low risk for diabetes, have this test once every three years after 78 years of age. ? If you are overweight and have a high risk for diabetes, consider being tested at a younger age or more often. Preventing infection Hepatitis B  If you have a higher risk for hepatitis B, you should be screened for this virus. You are considered at high risk for hepatitis B if: ? You were born in a country where hepatitis B is common. Ask your health care provider which countries are considered high risk. ? Your parents were born in a high-risk country, and you have not been immunized against hepatitis B (hepatitis B vaccine). ? You have HIV or AIDS. ? You use needles to inject street drugs. ? You live with someone who has hepatitis B. ? You have had sex with someone who has hepatitis B. ? You get hemodialysis treatment. ? You take certain medicines for conditions, including cancer, organ transplantation, and  autoimmune conditions.  Hepatitis C  Blood testing is recommended for: ? Everyone born from [birth year] through 1966. ? Anyone with known risk factors for hepatitis C.  Sexually transmitted infections (STIs)  You should be screened for sexually transmitted infections (STIs) including gonorrhea and chlamydia if: ? You are sexually active and are younger than 78 years of age. ? You are older than 78 years  of age and your health care provider tells you that you are at risk for this type of infection. ? Your sexual activity has changed since you were last screened and you are at an increased risk for chlamydia or gonorrhea. Ask your health care provider if you are at risk.  If you do not have HIV, but are at risk, it may be recommended that you take a prescription medicine daily to prevent HIV infection. This is called pre-exposure prophylaxis (PrEP). You are considered at risk if: ? You are sexually active and do not regularly use condoms or know the HIV status of your partner(s). ? You take drugs by injection. ? You are sexually active with a partner who has HIV.  Talk with your health care provider about whether you are at high risk of being infected with HIV. If you choose to begin PrEP, you should first be tested for HIV. You should then be tested every 3 months for as long as you are taking PrEP. Pregnancy  If you are premenopausal and you may become pregnant, ask your health care provider about preconception counseling.  If you may become pregnant, take 400 to 800 micrograms (mcg) of folic acid every day.  If you want to prevent pregnancy, talk to your health care provider about birth control (contraception). Osteoporosis and menopause  Osteoporosis is a disease in which the bones lose minerals and strength with aging. This can result in serious bone fractures. Your risk for osteoporosis can be identified using a bone density scan.  If you are 67 years of age or older, or if you are at  risk for osteoporosis and fractures, ask your health care provider if you should be screened.  Ask your health care provider whether you should take a calcium or vitamin D supplement to lower your risk for osteoporosis.  Menopause may have certain physical symptoms and risks.  Hormone replacement therapy may reduce some of these symptoms and risks. Talk to your health care provider about whether hormone replacement therapy is right for you. Follow these instructions at home:  Schedule regular health, dental, and eye exams.  Stay current with your immunizations.  Do not use any tobacco products including cigarettes, chewing tobacco, or electronic cigarettes.  If you are pregnant, do not drink alcohol.  If you are breastfeeding, limit how much and how often you drink alcohol.  Limit alcohol intake to no more than 1 drink per day for nonpregnant women. One drink equals 12 ounces of beer, 5 ounces of Samarrah Tranchina, or 1 ounces of hard liquor.  Do not use street drugs.  Do not share needles.  Ask your health care provider for help if you need support or information about quitting drugs.  Tell your health care provider if you often feel depressed.  Tell your health care provider if you have ever been abused or do not feel safe at home. This information is not intended to replace advice given to you by your health care provider. Make sure you discuss any questions you have with your health care provider. Document Released: 11/10/2010 Document Revised: 10/03/2015 Document Reviewed: 01/29/2015 Elsevier Interactive Patient Education  2018 [employer].   .ramp
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
## 2018-01-21 NOTE — Progress Notes (Signed)
Abstracted and sent to scan  

## 2018-01-26 ENCOUNTER — Telehealth: Payer: Self-pay | Admitting: Emergency Medicine

## 2018-01-26 NOTE — Telephone Encounter (Signed)
Faxed last OV to [facility]

## 2018-01-26 NOTE — Telephone Encounter (Signed)
Copied from [place] [id number]. Topic: General - Other >> Jan 26, 2018  9:30 AM Yvette Rack wrote: Reason for CRM: pt has an appt on the 25th at [facility] they would like OV notes on pt [phone]

## 2018-02-02 DIAGNOSIS — H04123 Dry eye syndrome of bilateral lacrimal glands: Secondary | ICD-10-CM | POA: Diagnosis not present

## 2018-02-02 DIAGNOSIS — H35373 Puckering of macula, bilateral: Secondary | ICD-10-CM | POA: Diagnosis not present

## 2018-02-02 DIAGNOSIS — H26493 Other secondary cataract, bilateral: Secondary | ICD-10-CM | POA: Diagnosis not present

## 2018-02-02 DIAGNOSIS — Z961 Presence of intraocular lens: Secondary | ICD-10-CM | POA: Diagnosis not present

## 2018-04-05 ENCOUNTER — Ambulatory Visit (INDEPENDENT_AMBULATORY_CARE_PROVIDER_SITE_OTHER): Payer: Medicare Other | Admitting: Internal Medicine

## 2018-04-05 ENCOUNTER — Other Ambulatory Visit (INDEPENDENT_AMBULATORY_CARE_PROVIDER_SITE_OTHER): Payer: Medicare Other

## 2018-04-05 ENCOUNTER — Encounter: Payer: Self-pay | Admitting: Internal Medicine

## 2018-04-05 VITALS — BP 132/76 | HR 69 | Temp 99.2°F | Ht 59.0 in | Wt 143.0 lb

## 2018-04-05 DIAGNOSIS — I10 Essential (primary) hypertension: Secondary | ICD-10-CM | POA: Diagnosis not present

## 2018-04-05 DIAGNOSIS — R609 Edema, unspecified: Secondary | ICD-10-CM

## 2018-04-05 DIAGNOSIS — N183 Chronic kidney disease, stage 3 unspecified: Secondary | ICD-10-CM

## 2018-04-05 DIAGNOSIS — E1121 Type 2 diabetes mellitus with diabetic nephropathy: Secondary | ICD-10-CM | POA: Diagnosis not present

## 2018-04-05 DIAGNOSIS — M79604 Pain in right leg: Secondary | ICD-10-CM | POA: Diagnosis not present

## 2018-04-05 DIAGNOSIS — N259 Disorder resulting from impaired renal tubular function, unspecified: Secondary | ICD-10-CM | POA: Diagnosis not present

## 2018-04-05 DIAGNOSIS — R9431 Abnormal electrocardiogram [ECG] [EKG]: Secondary | ICD-10-CM | POA: Diagnosis not present

## 2018-04-05 DIAGNOSIS — M79605 Pain in left leg: Secondary | ICD-10-CM | POA: Diagnosis not present

## 2018-04-05 DIAGNOSIS — E119 Type 2 diabetes mellitus without complications: Secondary | ICD-10-CM | POA: Diagnosis not present

## 2018-04-05 DIAGNOSIS — R198 Other specified symptoms and signs involving the digestive system and abdomen: Secondary | ICD-10-CM

## 2018-04-05 LAB — BASIC METABOLIC PANEL
BUN: 20 mg/dL (ref 6–23)
CHLORIDE: 107 meq/L (ref 96–112)
CO2: 23 mEq/L (ref 19–32)
Calcium: 10 mg/dL (ref 8.4–10.5)
Creatinine, Ser: 1.49 mg/dL — ABNORMAL HIGH (ref 0.40–1.20)
GFR: 43.5 mL/min — AB (ref >60.00)
Glucose, Bld: 175 mg/dL — ABNORMAL HIGH (ref 70–99)
POTASSIUM: 4.3 meq/L (ref 3.5–5.1)
SODIUM: 140 meq/L (ref 135–145)

## 2018-04-05 LAB — HEMOGLOBIN A1C: Hgb A1c MFr Bld: 6.2 % (ref 4.6–6.5)

## 2018-04-05 NOTE — Patient Instructions (Signed)

## 2018-04-05 NOTE — Assessment & Plan Note (Addendum)
On Coreg, Spironolactone CT ca scoring option info given  

## 2018-04-05 NOTE — Assessment & Plan Note (Signed)
C[place]

## 2018-04-05 NOTE — Progress Notes (Signed)
Subjective:  Patient ID: Brandy Gonzales, female    DOB: [birth year]  Age: 77 y.o. MRN: [id number]  CC: No chief complaint on file.   HPI Brandy Gonzales presents for HTN, OA, DM f/u C/o large abdomen, no pain   Outpatient Medications Prior to Visit  Medication Sig Dispense Refill  . acetaminophen (TYLENOL) 500 MG tablet Take 500 mg by mouth every 6 (six) hours as needed.    Marland Kitchen aspirin 81 MG EC tablet Take 81 mg by mouth daily.      . carvedilol (COREG) 25 MG tablet Take 1 tablet (25 mg total) by mouth 2 (two) times daily with a meal. 180 tablet 3  . cetirizine (ZYRTEC) 5 MG tablet Take 1 tablet (5 mg total) by mouth daily. 10 tablet 0  . cholecalciferol (VITAMIN D) 1000 units tablet Take 1 tablet (1,000 Units total) by mouth daily. 30 tablet 11  . glimepiride (AMARYL) 2 MG tablet TAKE ONE TABLET BY MOUTH ONCE DAILY BEFORE  BREAKFAST 90 tablet 3  . omeprazole (PRILOSEC) 20 MG capsule Take 1 capsule (20 mg total) by mouth daily. 90 capsule 3  . spironolactone (ALDACTONE) 50 MG tablet TAKE ONE TABLET BY MOUTH ONCE DAILY FOR HIGH BLOOD PRESSURE 90 tablet 0   No facility-administered medications prior to visit.     ROS: Review of Systems  Constitutional: Negative for activity change, appetite change, chills, fatigue and unexpected weight change.  HENT: Negative for congestion, mouth sores and sinus pressure.   Eyes: Negative for visual disturbance.  Respiratory: Negative for cough and chest tightness.   Gastrointestinal: Negative for abdominal pain and nausea.  Genitourinary: Negative for difficulty urinating, frequency and vaginal pain.  Musculoskeletal: Positive for arthralgias and gait problem. Negative for back pain.  Skin: Negative for pallor and rash.  Neurological: Negative for dizziness, tremors, weakness, numbness and headaches.  Psychiatric/Behavioral: Negative for confusion and sleep disturbance.    Objective:  BP 132/76 (BP Location: Left Arm, Patient Position: Sitting, Cuff  Size: Normal)   Pulse 69   Temp 99.2 F (37.3 C) (Oral)   Ht 4\' 11"  (1.499 m)   Wt 143 lb (64.9 kg)   SpO2 98%   BMI 28.88 kg/m   BP Readings from Last 3 Encounters:  04/05/18 132/76  01/19/18 118/64  01/07/18 140/72    Wt Readings from Last 3 Encounters:  04/05/18 143 lb (64.9 kg)  01/19/18 141 lb (64 kg)  11/30/17 141 lb (64 kg)    Physical Exam  Constitutional: She appears well-developed. No distress.  HENT:  Head: Normocephalic.  Right Ear: External ear normal.  Left Ear: External ear normal.  Nose: Nose normal.  Mouth/Throat: Oropharynx is clear and moist.  Eyes: Pupils are equal, round, and reactive to light. Conjunctivae are normal. Right eye exhibits no discharge. Left eye exhibits no discharge.  Neck: Normal range of motion. Neck supple. No JVD present. No tracheal deviation present. No thyromegaly present.  Cardiovascular: Normal rate, regular rhythm and normal heart sounds.  Pulmonary/Chest: No stridor. No respiratory distress. She has no wheezes.  Abdominal: Soft. Bowel sounds are normal. She exhibits no distension and no mass. There is no tenderness. There is no rebound and no guarding.  Musculoskeletal: She exhibits no edema or tenderness.  Lymphadenopathy:    She has no cervical adenopathy.  Neurological: She displays normal reflexes. No cranial nerve deficit. She exhibits normal muscle tone. Coordination normal.  Skin: No rash noted. No erythema.  Psychiatric: She has a normal  mood and affect. Her behavior is normal. Judgment and thought content normal.  abd large  Lab Results  Component Value Date   WBC 7.6 10/25/2015   HGB 12.4 10/25/2015   HCT 37.8 10/25/2015   PLT 154.0 10/25/2015   GLUCOSE 304 (H) 01/19/2018   CHOL 188 10/25/2015   TRIG 129.0 10/25/2015   HDL 39.40 10/25/2015   LDLDIRECT 78.9 10/22/2008   LDLCALC 123 (H) 10/25/2015   ALT 12 10/25/2015   AST 14 10/25/2015   NA 137 01/19/2018   K 5.0 01/19/2018   CL 107 01/19/2018    CREATININE 1.56 (H) 01/19/2018   BUN 30 (H) 01/19/2018   CO2 24 01/19/2018   TSH 2.69 10/25/2015   INR 1.03 08/16/2011   HGBA1C 7.2 (H) 01/19/2018   MICROALBUR 0.7 08/08/2015    No results found.  Assessment & Plan:   There are no diagnoses linked to this encounter.   No orders of the defined types were placed in this encounter.    Follow-up: No follow-ups on file.  Walker Kehr, MD

## 2018-04-05 NOTE — Assessment & Plan Note (Addendum)
Labs CT ca scoring option info given 11/19

## 2018-04-05 NOTE — Assessment & Plan Note (Signed)
Mild - better

## 2018-04-05 NOTE — Assessment & Plan Note (Signed)
CT ca scoring option info given

## 2018-04-05 NOTE — Assessment & Plan Note (Signed)
subjective - nl xam. CT offered

## 2018-04-05 NOTE — Assessment & Plan Note (Signed)
BMET 

## 2018-05-06 ENCOUNTER — Other Ambulatory Visit: Payer: Self-pay | Admitting: Internal Medicine

## 2018-08-02 ENCOUNTER — Other Ambulatory Visit (INDEPENDENT_AMBULATORY_CARE_PROVIDER_SITE_OTHER): Payer: Medicare Other

## 2018-08-02 ENCOUNTER — Encounter: Payer: Self-pay | Admitting: Internal Medicine

## 2018-08-02 ENCOUNTER — Ambulatory Visit (INDEPENDENT_AMBULATORY_CARE_PROVIDER_SITE_OTHER): Payer: Medicare Other | Admitting: Internal Medicine

## 2018-08-02 ENCOUNTER — Other Ambulatory Visit: Payer: Self-pay

## 2018-08-02 DIAGNOSIS — R609 Edema, unspecified: Secondary | ICD-10-CM

## 2018-08-02 DIAGNOSIS — N183 Chronic kidney disease, stage 3 unspecified: Secondary | ICD-10-CM

## 2018-08-02 DIAGNOSIS — E1121 Type 2 diabetes mellitus with diabetic nephropathy: Secondary | ICD-10-CM | POA: Diagnosis not present

## 2018-08-02 DIAGNOSIS — M79604 Pain in right leg: Secondary | ICD-10-CM

## 2018-08-02 DIAGNOSIS — E119 Type 2 diabetes mellitus without complications: Secondary | ICD-10-CM | POA: Diagnosis not present

## 2018-08-02 DIAGNOSIS — M79605 Pain in left leg: Secondary | ICD-10-CM

## 2018-08-02 DIAGNOSIS — N2889 Other specified disorders of kidney and ureter: Secondary | ICD-10-CM

## 2018-08-02 DIAGNOSIS — N259 Disorder resulting from impaired renal tubular function, unspecified: Secondary | ICD-10-CM | POA: Diagnosis not present

## 2018-08-02 LAB — BASIC METABOLIC PANEL
BUN: 27 mg/dL — AB (ref 6–23)
CALCIUM: 9.7 mg/dL (ref 8.4–10.5)
CHLORIDE: 107 meq/L (ref 96–112)
CO2: 25 mEq/L (ref 19–32)
CREATININE: 1.45 mg/dL — AB (ref 0.40–1.20)
GFR: 42.2 mL/min — ABNORMAL LOW (ref >60.00)
Glucose, Bld: 226 mg/dL — ABNORMAL HIGH (ref 70–99)
Potassium: 4.5 mEq/L (ref 3.5–5.1)
Sodium: 139 mEq/L (ref 135–145)

## 2018-08-02 LAB — HEMOGLOBIN A1C: HEMOGLOBIN A1C: 6.5 % (ref 4.6–6.5)

## 2018-08-02 NOTE — Assessment & Plan Note (Signed)
-   taking Glimeperide

## 2018-08-02 NOTE — Progress Notes (Signed)
Subjective:  Patient ID: Brandy Gonzales, female    DOB: 10/27/1940  Age: 78 y.o. MRN: [id number]  CC: No chief complaint on file.   HPI Brandy Gonzales presents for HTN, DM, OA f/u  Outpatient Medications Prior to Visit  Medication Sig Dispense Refill  . acetaminophen (TYLENOL) 500 MG tablet Take 500 mg by mouth every 6 (six) hours as needed.    Marland Kitchen aspirin 81 MG EC tablet Take 81 mg by mouth daily.      . carvedilol (COREG) 25 MG tablet Take 1 tablet (25 mg total) by mouth 2 (two) times daily with a meal. 180 tablet 3  . cetirizine (ZYRTEC) 5 MG tablet Take 1 tablet (5 mg total) by mouth daily. 10 tablet 0  . cholecalciferol (VITAMIN D) 1000 units tablet Take 1 tablet (1,000 Units total) by mouth daily. 30 tablet 11  . glimepiride (AMARYL) 2 MG tablet TAKE 1 TABLET BY MOUTH ONCE DAILY BEFORE BREAKFAST 90 tablet 3  . omeprazole (PRILOSEC) 20 MG capsule Take 1 capsule (20 mg total) by mouth daily. 90 capsule 3  . spironolactone (ALDACTONE) 50 MG tablet TAKE ONE TABLET BY MOUTH ONCE DAILY FOR HIGH BLOOD PRESSURE 90 tablet 0   No facility-administered medications prior to visit.     ROS: Review of Systems  Constitutional: Negative for activity change, appetite change, chills, fatigue and unexpected weight change.  HENT: Negative for congestion, mouth sores and sinus pressure.   Eyes: Negative for visual disturbance.  Respiratory: Negative for cough and chest tightness.   Gastrointestinal: Negative for abdominal pain and nausea.  Genitourinary: Negative for difficulty urinating, frequency and vaginal pain.  Musculoskeletal: Positive for arthralgias and gait problem. Negative for back pain.  Skin: Negative for pallor and rash.  Neurological: Negative for dizziness, tremors, weakness, numbness and headaches.  Psychiatric/Behavioral: Negative for confusion, sleep disturbance and suicidal ideas.    Objective:  BP 140/72 (BP Location: Left Arm, Patient Position: Sitting, Cuff Size: Normal)    Pulse 69   Temp 97.8 F (36.6 C) (Oral)   Ht 4\' 11"  (1.499 m)   Wt 141 lb (64 kg)   SpO2 99%   BMI 28.48 kg/m   BP Readings from Last 3 Encounters:  08/02/18 140/72  04/05/18 132/76  01/19/18 118/64    Wt Readings from Last 3 Encounters:  08/02/18 141 lb (64 kg)  04/05/18 143 lb (64.9 kg)  01/19/18 141 lb (64 kg)    Physical Exam Constitutional:      General: She is not in acute distress.    Appearance: She is well-developed.  HENT:     Head: Normocephalic.     Right Ear: External ear normal.     Left Ear: External ear normal.     Nose: Nose normal.  Eyes:     General:        Right eye: No discharge.        Left eye: No discharge.     Conjunctiva/sclera: Conjunctivae normal.     Pupils: Pupils are equal, round, and reactive to light.  Neck:     Musculoskeletal: Normal range of motion and neck supple.     Thyroid: No thyromegaly.     Vascular: No JVD.     Trachea: No tracheal deviation.  Cardiovascular:     Rate and Rhythm: Normal rate and regular rhythm.     Heart sounds: Normal heart sounds.  Pulmonary:     Effort: No respiratory distress.  Breath sounds: No stridor. No wheezing.  Abdominal:     General: Bowel sounds are normal. There is no distension.     Palpations: Abdomen is soft. There is no mass.     Tenderness: There is no abdominal tenderness. There is no guarding or rebound.  Musculoskeletal:        General: No tenderness.  Lymphadenopathy:     Cervical: No cervical adenopathy.  Skin:    Findings: No erythema or rash.  Neurological:     Mental Status: She is oriented to person, place, and time.     Cranial Nerves: No cranial nerve deficit.     Motor: No abnormal muscle tone.     Coordination: Coordination abnormal.     Deep Tendon Reflexes: Reflexes normal.  Psychiatric:        Behavior: Behavior normal.        Thought Content: Thought content normal.        Judgment: Judgment normal.    Cane LLE w/pain   Lab Results  Component  Value Date   WBC 7.6 10/25/2015   HGB 12.4 10/25/2015   HCT 37.8 10/25/2015   PLT 154.0 10/25/2015   GLUCOSE 226 (H) 08/02/2018   CHOL 188 10/25/2015   TRIG 129.0 10/25/2015   HDL 39.40 10/25/2015   LDLDIRECT 78.9 10/22/2008   LDLCALC 123 (H) 10/25/2015   ALT 12 10/25/2015   AST 14 10/25/2015   NA 139 08/02/2018   K 4.5 08/02/2018   CL 107 08/02/2018   CREATININE 1.45 (H) 08/02/2018   BUN 27 (H) 08/02/2018   CO2 25 08/02/2018   TSH 2.69 10/25/2015   INR 1.03 08/16/2011   HGBA1C 6.5 08/02/2018   MICROALBUR 0.7 08/08/2015    No results found.  Assessment & Plan:   There are no diagnoses linked to this encounter.   No orders of the defined types were placed in this encounter.    Follow-up: No follow-ups on file.  Walker Kehr, MD

## 2018-08-02 NOTE — Assessment & Plan Note (Signed)
Monitor labs 

## 2018-08-02 NOTE — Assessment & Plan Note (Signed)
Tylenol.

## 2018-08-02 NOTE — Assessment & Plan Note (Signed)
Better  

## 2018-10-05 DIAGNOSIS — H26493 Other secondary cataract, bilateral: Secondary | ICD-10-CM | POA: Diagnosis not present

## 2018-10-05 DIAGNOSIS — H35373 Puckering of macula, bilateral: Secondary | ICD-10-CM | POA: Diagnosis not present

## 2018-10-05 DIAGNOSIS — Z961 Presence of intraocular lens: Secondary | ICD-10-CM | POA: Diagnosis not present

## 2018-10-05 DIAGNOSIS — H04123 Dry eye syndrome of bilateral lacrimal glands: Secondary | ICD-10-CM | POA: Diagnosis not present

## 2018-10-07 ENCOUNTER — Telehealth: Payer: Self-pay | Admitting: Internal Medicine

## 2018-10-07 MED ORDER — CARVEDILOL 25 MG PO TABS: 25.0000 mg | ORAL_TABLET | Freq: Two times a day (BID) | ORAL | 3 refills | Status: DC

## 2018-10-07 NOTE — Telephone Encounter (Signed)
RX sent

## 2018-10-07 NOTE — Telephone Encounter (Signed)
Copied from [place] [phone]. Topic: Quick Communication - Rx Refill/Question >> Oct 07, 2018  2:49 PM Mcneil, Ja-Kwan wrote: Medication: carvedilol (COREG) 25 MG tablet  Has the patient contacted their pharmacy? yes   Preferred Pharmacy (with phone number or street name): [place], [place] - [address] [phone] (Phone)  [phone] (Fax)  Agent: Please be advised that RX refills may take up to 3 business days. We ask that you follow-up with your pharmacy.

## 2018-12-01 ENCOUNTER — Ambulatory Visit (INDEPENDENT_AMBULATORY_CARE_PROVIDER_SITE_OTHER): Payer: Medicare Other | Admitting: Internal Medicine

## 2018-12-01 ENCOUNTER — Other Ambulatory Visit (INDEPENDENT_AMBULATORY_CARE_PROVIDER_SITE_OTHER): Payer: Medicare Other

## 2018-12-01 ENCOUNTER — Other Ambulatory Visit: Payer: Self-pay

## 2018-12-01 ENCOUNTER — Encounter: Payer: Self-pay | Admitting: Internal Medicine

## 2018-12-01 DIAGNOSIS — N183 Chronic kidney disease, stage 3 unspecified: Secondary | ICD-10-CM

## 2018-12-01 DIAGNOSIS — E119 Type 2 diabetes mellitus without complications: Secondary | ICD-10-CM | POA: Diagnosis not present

## 2018-12-01 DIAGNOSIS — I10 Essential (primary) hypertension: Secondary | ICD-10-CM

## 2018-12-01 DIAGNOSIS — R609 Edema, unspecified: Secondary | ICD-10-CM | POA: Diagnosis not present

## 2018-12-01 DIAGNOSIS — N259 Disorder resulting from impaired renal tubular function, unspecified: Secondary | ICD-10-CM

## 2018-12-01 DIAGNOSIS — E1121 Type 2 diabetes mellitus with diabetic nephropathy: Secondary | ICD-10-CM

## 2018-12-01 LAB — HEMOGLOBIN A1C: Hgb A1c MFr Bld: 6.6 % — ABNORMAL HIGH (ref 4.6–6.5)

## 2018-12-01 LAB — BASIC METABOLIC PANEL
BUN: 24 mg/dL — ABNORMAL HIGH (ref 6–23)
CO2: 22 mEq/L (ref 19–32)
Calcium: 10.1 mg/dL (ref 8.4–10.5)
Chloride: 112 mEq/L (ref 96–112)
Creatinine, Ser: 1.81 mg/dL — ABNORMAL HIGH (ref 0.40–1.20)
GFR: 32.64 mL/min — ABNORMAL LOW (ref >60.00)
Glucose, Bld: 189 mg/dL — ABNORMAL HIGH (ref 70–99)
Potassium: 4.8 mEq/L (ref 3.5–5.1)
Sodium: 143 mEq/L (ref 135–145)

## 2018-12-01 NOTE — Assessment & Plan Note (Signed)
Glimeperide

## 2018-12-01 NOTE — Progress Notes (Signed)
Subjective:  Patient ID: Brandy Gonzales, female    DOB: 06/13/1940  Age: 78 y.o. MRN: [id number]  CC: No chief complaint on file.   HPI Brandy Gonzales presents for DM, HTN, OA f/u  Outpatient Medications Prior to Visit  Medication Sig Dispense Refill  . acetaminophen (TYLENOL) 500 MG tablet Take 500 mg by mouth every 6 (six) hours as needed.    Marland Kitchen aspirin 81 MG EC tablet Take 81 mg by mouth daily.      . carvedilol (COREG) 25 MG tablet Take 1 tablet (25 mg total) by mouth 2 (two) times daily with a meal. 180 tablet 3  . cetirizine (ZYRTEC) 5 MG tablet Take 1 tablet (5 mg total) by mouth daily. 10 tablet 0  . glimepiride (AMARYL) 2 MG tablet TAKE 1 TABLET BY MOUTH ONCE DAILY BEFORE BREAKFAST 90 tablet 3  . spironolactone (ALDACTONE) 50 MG tablet TAKE ONE TABLET BY MOUTH ONCE DAILY FOR HIGH BLOOD PRESSURE 90 tablet 0  . omeprazole (PRILOSEC) 20 MG capsule Take 1 capsule (20 mg total) by mouth daily. 90 capsule 3   No facility-administered medications prior to visit.     ROS: Review of Systems  Constitutional: Negative for activity change, appetite change, chills, fatigue and unexpected weight change.  HENT: Negative for congestion, mouth sores and sinus pressure.   Eyes: Negative for visual disturbance.  Respiratory: Negative for cough and chest tightness.   Gastrointestinal: Negative for abdominal pain and nausea.  Genitourinary: Negative for difficulty urinating, frequency and vaginal pain.  Musculoskeletal: Positive for arthralgias and gait problem. Negative for back pain.  Skin: Negative for pallor and rash.  Neurological: Negative for dizziness, tremors, weakness, numbness and headaches.  Psychiatric/Behavioral: Negative for confusion, sleep disturbance and suicidal ideas.    Objective:  BP (!) 148/72 (BP Location: Left Arm, Patient Position: Sitting, Cuff Size: Normal)   Pulse 72   Temp 98.6 F (37 C) (Oral)   Ht 4\' 11"  (1.499 m)   Wt 141 lb (64 kg)   SpO2 98%   BMI 28.48  kg/m   BP Readings from Last 3 Encounters:  12/01/18 (!) 148/72  08/02/18 140/72  04/05/18 132/76    Wt Readings from Last 3 Encounters:  12/01/18 141 lb (64 kg)  08/02/18 141 lb (64 kg)  04/05/18 143 lb (64.9 kg)    Physical Exam Constitutional:      General: She is not in acute distress.    Appearance: She is well-developed.  HENT:     Head: Normocephalic.     Right Ear: External ear normal.     Left Ear: External ear normal.     Nose: Nose normal.  Eyes:     General:        Right eye: No discharge.        Left eye: No discharge.     Conjunctiva/sclera: Conjunctivae normal.     Pupils: Pupils are equal, round, and reactive to light.  Neck:     Musculoskeletal: Normal range of motion and neck supple.     Thyroid: No thyromegaly.     Vascular: No JVD.     Trachea: No tracheal deviation.  Cardiovascular:     Rate and Rhythm: Normal rate and regular rhythm.     Heart sounds: Normal heart sounds.  Pulmonary:     Effort: No respiratory distress.     Breath sounds: No stridor. No wheezing.  Abdominal:     General: Bowel sounds are normal. There  is no distension.     Palpations: Abdomen is soft. There is no mass.     Tenderness: There is no abdominal tenderness. There is no guarding or rebound.  Musculoskeletal:        General: Tenderness present.  Lymphadenopathy:     Cervical: No cervical adenopathy.  Skin:    Findings: No erythema or rash.  Neurological:     Cranial Nerves: No cranial nerve deficit.     Motor: No abnormal muscle tone.     Coordination: Coordination normal.     Deep Tendon Reflexes: Reflexes normal.  Psychiatric:        Behavior: Behavior normal.        Thought Content: Thought content normal.        Judgment: Judgment normal.   cane  Lab Results  Component Value Date   WBC 7.6 10/25/2015   HGB 12.4 10/25/2015   HCT 37.8 10/25/2015   PLT 154.0 10/25/2015   GLUCOSE 226 (H) 08/02/2018   CHOL 188 10/25/2015   TRIG 129.0 10/25/2015    HDL 39.40 10/25/2015   LDLDIRECT 78.9 10/22/2008   LDLCALC 123 (H) 10/25/2015   ALT 12 10/25/2015   AST 14 10/25/2015   NA 139 08/02/2018   K 4.5 08/02/2018   CL 107 08/02/2018   CREATININE 1.45 (H) 08/02/2018   BUN 27 (H) 08/02/2018   CO2 25 08/02/2018   TSH 2.69 10/25/2015   INR 1.03 08/16/2011   HGBA1C 6.5 08/02/2018   MICROALBUR 0.7 08/08/2015    No results found.  Assessment & Plan:   There are no diagnoses linked to this encounter.   No orders of the defined types were placed in this encounter.    Follow-up: No follow-ups on file.  Walker Kehr, MD

## 2018-12-01 NOTE — Assessment & Plan Note (Signed)
On Coreg, Spironolactone 

## 2018-12-01 NOTE — Assessment & Plan Note (Signed)
Labs pending.  

## 2018-12-01 NOTE — Patient Instructions (Signed)
If you have medicare related insurance (such as traditional Medicare, Blue Cross Medicare, United Health[employer] Medicare, or similar), Please make an appointment at the scheduling desk with Jill, the Wellness Health Coach, for your Wellness visit in this office, which is a benefit with your insurance.  

## 2018-12-01 NOTE — Assessment & Plan Note (Signed)
Chronic L>R

## 2019-01-08 ENCOUNTER — Other Ambulatory Visit: Payer: Self-pay | Admitting: Internal Medicine

## 2019-04-03 ENCOUNTER — Other Ambulatory Visit (INDEPENDENT_AMBULATORY_CARE_PROVIDER_SITE_OTHER): Payer: Medicare Other

## 2019-04-03 DIAGNOSIS — N259 Disorder resulting from impaired renal tubular function, unspecified: Secondary | ICD-10-CM

## 2019-04-03 DIAGNOSIS — E119 Type 2 diabetes mellitus without complications: Secondary | ICD-10-CM | POA: Diagnosis not present

## 2019-04-03 LAB — BASIC METABOLIC PANEL
BUN: 21 mg/dL (ref 6–23)
CO2: 23 mEq/L (ref 19–32)
Calcium: 9.6 mg/dL (ref 8.4–10.5)
Chloride: 107 mEq/L (ref 96–112)
Creatinine, Ser: 1.65 mg/dL — ABNORMAL HIGH (ref 0.40–1.20)
GFR: 36.29 mL/min — ABNORMAL LOW (ref >60.00)
Glucose, Bld: 267 mg/dL — ABNORMAL HIGH (ref 70–99)
Potassium: 4.7 mEq/L (ref 3.5–5.1)
Sodium: 140 mEq/L (ref 135–145)

## 2019-04-03 LAB — HEMOGLOBIN A1C: Hgb A1c MFr Bld: 6.6 % — ABNORMAL HIGH (ref 4.6–6.5)

## 2019-04-04 ENCOUNTER — Encounter: Payer: Self-pay | Admitting: Internal Medicine

## 2019-04-04 ENCOUNTER — Other Ambulatory Visit: Payer: Self-pay

## 2019-04-04 ENCOUNTER — Ambulatory Visit (INDEPENDENT_AMBULATORY_CARE_PROVIDER_SITE_OTHER): Payer: Medicare Other | Admitting: Internal Medicine

## 2019-04-04 DIAGNOSIS — K219 Gastro-esophageal reflux disease without esophagitis: Secondary | ICD-10-CM

## 2019-04-04 DIAGNOSIS — E1121 Type 2 diabetes mellitus with diabetic nephropathy: Secondary | ICD-10-CM | POA: Diagnosis not present

## 2019-04-04 DIAGNOSIS — I10 Essential (primary) hypertension: Secondary | ICD-10-CM | POA: Diagnosis not present

## 2019-04-04 NOTE — Patient Instructions (Signed)
If you have medicare related insurance (such as traditional Medicare, Blue Cross Medicare, United Health[employer] Medicare, or similar), Please make an appointment at the scheduling desk with Jill, the Wellness Health Coach, for your Wellness visit in this office, which is a benefit with your insurance.  

## 2019-04-04 NOTE — Assessment & Plan Note (Signed)
Prilosec 

## 2019-04-04 NOTE — Assessment & Plan Note (Signed)
Chronic - on Glimeperide Labs

## 2019-04-04 NOTE — Progress Notes (Signed)
Subjective:  Patient ID: Brandy Gonzales, female    DOB: [birth year]  Age: 78 y.o. MRN: [id number]  CC: No chief complaint on file.   HPI Brandy Gonzales presents for HTN, OA, GERD f/u  Outpatient Medications Prior to Visit  Medication Sig Dispense Refill  . acetaminophen (TYLENOL) 500 MG tablet Take 500 mg by mouth every 6 (six) hours as needed.    Marland Kitchen aspirin 81 MG EC tablet Take 81 mg by mouth daily.      . carvedilol (COREG) 25 MG tablet Take 1 tablet (25 mg total) by mouth 2 (two) times daily with a meal. 180 tablet 3  . cetirizine (ZYRTEC) 5 MG tablet Take 1 tablet (5 mg total) by mouth daily. 10 tablet 0  . glimepiride (AMARYL) 2 MG tablet TAKE 1 TABLET BY MOUTH ONCE DAILY BEFORE BREAKFAST 90 tablet 3  . omeprazole (PRILOSEC) 20 MG capsule TAKE 1 CAPSULE BY MOUTH  DAILY 90 capsule 3  . spironolactone (ALDACTONE) 50 MG tablet TAKE 1 TABLET BY MOUTH ONCE DAILY FOR  HIGH  BLOOD  PRESSURE 90 tablet 3   No facility-administered medications prior to visit.     ROS: Review of Systems  Constitutional: Negative for activity change, appetite change, chills, fatigue and unexpected weight change.  HENT: Negative for congestion, mouth sores and sinus pressure.   Eyes: Negative for visual disturbance.  Respiratory: Negative for cough and chest tightness.   Gastrointestinal: Negative for abdominal pain and nausea.  Genitourinary: Negative for difficulty urinating, frequency and vaginal pain.  Musculoskeletal: Positive for back pain and gait problem.  Skin: Negative for pallor and rash.  Neurological: Negative for dizziness, tremors, weakness, numbness and headaches.  Psychiatric/Behavioral: Negative for confusion, sleep disturbance and suicidal ideas.    Objective:  BP (!) 146/72 (BP Location: Left Arm, Patient Position: Sitting, Cuff Size: Normal)   Pulse 68   Temp 97.6 F (36.4 C) (Oral)   Ht 4\' 11"  (1.499 m)   Wt 142 lb (64.4 kg)   SpO2 98%   BMI 28.68 kg/m   BP Readings from Last  3 Encounters:  04/04/19 (!) 146/72  12/01/18 (!) 148/72  08/02/18 140/72    Wt Readings from Last 3 Encounters:  04/04/19 142 lb (64.4 kg)  12/01/18 141 lb (64 kg)  08/02/18 141 lb (64 kg)    Physical Exam Constitutional:      General: She is not in acute distress.    Appearance: She is well-developed.  HENT:     Head: Normocephalic.     Right Ear: External ear normal.     Left Ear: External ear normal.     Nose: Nose normal.  Eyes:     General:        Right eye: No discharge.        Left eye: No discharge.     Conjunctiva/sclera: Conjunctivae normal.     Pupils: Pupils are equal, round, and reactive to light.  Neck:     Musculoskeletal: Normal range of motion and neck supple.     Thyroid: No thyromegaly.     Vascular: No JVD.     Trachea: No tracheal deviation.  Cardiovascular:     Rate and Rhythm: Normal rate and regular rhythm.     Heart sounds: Normal heart sounds.  Pulmonary:     Effort: No respiratory distress.     Breath sounds: No stridor. No wheezing.  Abdominal:     General: Bowel sounds are normal. There is  no distension.     Palpations: Abdomen is soft. There is no mass.     Tenderness: There is no abdominal tenderness. There is no guarding or rebound.  Musculoskeletal:        General: No tenderness.  Lymphadenopathy:     Cervical: No cervical adenopathy.  Skin:    Findings: No erythema or rash.  Neurological:     Mental Status: She is oriented to person, place, and time.     Cranial Nerves: No cranial nerve deficit.     Motor: No abnormal muscle tone.     Coordination: Coordination normal.     Gait: Gait abnormal.     Deep Tendon Reflexes: Reflexes normal.  Psychiatric:        Behavior: Behavior normal.        Thought Content: Thought content normal.        Judgment: Judgment normal.    [place]  Lab Results  Component Value Date   WBC 7.6 10/25/2015   HGB 12.4 10/25/2015   HCT 37.8 10/25/2015   PLT 154.0 10/25/2015   GLUCOSE 267 (H)  04/03/2019   CHOL 188 10/25/2015   TRIG 129.0 10/25/2015   HDL 39.40 10/25/2015   LDLDIRECT 78.9 10/22/2008   LDLCALC 123 (H) 10/25/2015   ALT 12 10/25/2015   AST 14 10/25/2015   NA 140 04/03/2019   K 4.7 04/03/2019   CL 107 04/03/2019   CREATININE 1.65 (H) 04/03/2019   BUN 21 04/03/2019   CO2 23 04/03/2019   TSH 2.69 10/25/2015   INR 1.03 08/16/2011   HGBA1C 6.6 (H) 04/03/2019   MICROALBUR 0.7 08/08/2015    No results found.  Assessment & Plan:   There are no diagnoses linked to this encounter.   No orders of the defined types were placed in this encounter.    Follow-up: No follow-ups on file.  Walker Kehr, MD

## 2019-04-04 NOTE — Assessment & Plan Note (Signed)
Chronic On Coreg, Spironolactone labs

## 2019-04-20 ENCOUNTER — Other Ambulatory Visit: Payer: Self-pay | Admitting: Internal Medicine

## 2019-05-03 ENCOUNTER — Other Ambulatory Visit: Payer: Self-pay

## 2019-05-03 ENCOUNTER — Emergency Department (HOSPITAL_COMMUNITY)
Admission: EM | Admit: 2019-05-03 | Discharge: 2019-05-03 | Disposition: A | Discharge status: Home / Self Care | Payer: Medicare Other | Attending: Emergency Medicine | Admitting: Emergency Medicine

## 2019-05-03 ENCOUNTER — Encounter (HOSPITAL_COMMUNITY): Payer: Self-pay | Admitting: Emergency Medicine

## 2019-05-03 DIAGNOSIS — Z5321 Procedure and treatment not carried out due to patient leaving prior to being seen by health care provider: Secondary | ICD-10-CM | POA: Diagnosis not present

## 2019-05-03 DIAGNOSIS — R1031 Right lower quadrant pain: Secondary | ICD-10-CM | POA: Insufficient documentation

## 2019-05-03 LAB — COMPREHENSIVE METABOLIC PANEL
ALT: 17 U/L (ref 0–44)
AST: 17 U/L (ref 15–41)
Albumin: 3.9 g/dL (ref 3.5–5.0)
Alkaline Phosphatase: 77 U/L (ref 38–126)
Anion gap: 8 (ref 5–15)
BUN: 22 mg/dL (ref 8–23)
CO2: 25 mmol/L (ref 22–32)
Calcium: 10 mg/dL (ref 8.9–10.3)
Chloride: 107 mmol/L (ref 98–111)
Creatinine, Ser: 1.69 mg/dL — ABNORMAL HIGH (ref 0.44–1.00)
GFR calc Af Amer: 33 mL/min — ABNORMAL LOW (ref >60)
GFR calc non Af Amer: 28 mL/min — ABNORMAL LOW (ref >60)
Glucose, Bld: 237 mg/dL — ABNORMAL HIGH (ref 70–99)
Potassium: 4.9 mmol/L (ref 3.5–5.1)
Sodium: 140 mmol/L (ref 135–145)
Total Bilirubin: 0.7 mg/dL (ref 0.3–1.2)
Total Protein: 7 g/dL (ref 6.5–8.1)

## 2019-05-03 LAB — CBC
HCT: 39.2 % (ref 36.0–46.0)
Hemoglobin: 12.9 g/dL (ref 12.0–15.0)
MCH: 30.7 pg (ref 26.0–34.0)
MCHC: 32.9 g/dL (ref 30.0–36.0)
MCV: 93.3 fL (ref 80.0–100.0)
Platelets: 174 10*3/uL (ref 150–400)
RBC: 4.2 MIL/uL (ref 3.87–5.11)
RDW: 12.9 % (ref 11.5–15.5)
WBC: 7.5 10*3/uL (ref 4.0–10.5)
nRBC: 0 % (ref 0.0–0.2)

## 2019-05-03 LAB — LIPASE, BLOOD: Lipase: 20 U/L (ref 11–51)

## 2019-05-03 NOTE — ED Notes (Signed)
Patient reports she wants to wait in the lobby.

## 2019-05-03 NOTE — ED Triage Notes (Signed)
Patient reports right side flank pain radiating to RLQ x2 days. Denies urinary sx, N/V/D.

## 2019-05-03 NOTE — ED Notes (Signed)
Patient called for treatment room. Reports she is going home.

## 2019-05-08 ENCOUNTER — Other Ambulatory Visit: Payer: Self-pay

## 2019-05-08 ENCOUNTER — Ambulatory Visit (INDEPENDENT_AMBULATORY_CARE_PROVIDER_SITE_OTHER): Payer: Medicare Other

## 2019-05-08 ENCOUNTER — Encounter (HOSPITAL_COMMUNITY): Payer: Self-pay

## 2019-05-08 ENCOUNTER — Ambulatory Visit (HOSPITAL_COMMUNITY)
Admission: EM | Admit: 2019-05-08 | Discharge: 2019-05-08 | Disposition: A | Discharge status: Home / Self Care | Payer: Medicare Other | Attending: Family Medicine | Admitting: Family Medicine

## 2019-05-08 DIAGNOSIS — R1031 Right lower quadrant pain: Secondary | ICD-10-CM

## 2019-05-08 DIAGNOSIS — M7061 Trochanteric bursitis, right hip: Secondary | ICD-10-CM

## 2019-05-08 DIAGNOSIS — R52 Pain, unspecified: Secondary | ICD-10-CM

## 2019-05-08 MED ORDER — PREDNISONE 20 MG PO TABS: 20.0000 mg | ORAL_TABLET | Freq: Two times a day (BID) | ORAL | 0 refills | Status: DC

## 2019-05-08 MED ORDER — CEPHALEXIN 500 MG PO CAPS: 500.0000 mg | ORAL_CAPSULE | Freq: Two times a day (BID) | ORAL | 0 refills | Status: DC
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
## 2019-05-08 NOTE — ED Triage Notes (Signed)
Pt states she has a sharp pain in her right side groin area x 4 days.

## 2019-05-08 NOTE — Discharge Instructions (Signed)
Take the medication as prescribed Take one prednisone and one antibiotic morning and night Call your doctor if you are not improving by the end of the week

## 2019-07-31 ENCOUNTER — Other Ambulatory Visit (INDEPENDENT_AMBULATORY_CARE_PROVIDER_SITE_OTHER): Payer: Medicare Other

## 2019-07-31 ENCOUNTER — Other Ambulatory Visit: Payer: Self-pay

## 2019-07-31 DIAGNOSIS — N259 Disorder resulting from impaired renal tubular function, unspecified: Secondary | ICD-10-CM | POA: Diagnosis not present

## 2019-07-31 DIAGNOSIS — E119 Type 2 diabetes mellitus without complications: Secondary | ICD-10-CM | POA: Diagnosis not present

## 2019-07-31 LAB — BASIC METABOLIC PANEL
BUN: 17 mg/dL (ref 6–23)
CO2: 26 mEq/L (ref 19–32)
Calcium: 9.9 mg/dL (ref 8.4–10.5)
Chloride: 109 mEq/L (ref 96–112)
Creatinine, Ser: 1.43 mg/dL — ABNORMAL HIGH (ref 0.40–1.20)
GFR: 42.77 mL/min — ABNORMAL LOW (ref >60.00)
Glucose, Bld: 141 mg/dL — ABNORMAL HIGH (ref 70–99)
Potassium: 4.7 mEq/L (ref 3.5–5.1)
Sodium: 142 mEq/L (ref 135–145)

## 2019-07-31 LAB — HEMOGLOBIN A1C: Hgb A1c MFr Bld: 6.7 % — ABNORMAL HIGH (ref 4.6–6.5)

## 2019-08-02 ENCOUNTER — Encounter: Payer: Self-pay | Admitting: Internal Medicine

## 2019-08-02 ENCOUNTER — Ambulatory Visit (INDEPENDENT_AMBULATORY_CARE_PROVIDER_SITE_OTHER): Payer: Medicare Other | Admitting: Internal Medicine

## 2019-08-02 ENCOUNTER — Other Ambulatory Visit: Payer: Self-pay

## 2019-08-02 VITALS — BP 136/88 | HR 78 | Temp 97.8°F | Ht 59.0 in | Wt 144.5 lb

## 2019-08-02 DIAGNOSIS — I10 Essential (primary) hypertension: Secondary | ICD-10-CM | POA: Diagnosis not present

## 2019-08-02 DIAGNOSIS — E785 Hyperlipidemia, unspecified: Secondary | ICD-10-CM | POA: Diagnosis not present

## 2019-08-02 DIAGNOSIS — E1121 Type 2 diabetes mellitus with diabetic nephropathy: Secondary | ICD-10-CM | POA: Diagnosis not present

## 2019-08-02 DIAGNOSIS — N183 Chronic kidney disease, stage 3 unspecified: Secondary | ICD-10-CM

## 2019-08-02 MED ORDER — VITAMIN D3 50 MCG (2000 UT) PO CAPS: 2000.0000 [IU] | ORAL_CAPSULE | Freq: Every day | ORAL | 3 refills | Status: DC

## 2019-08-02 NOTE — Assessment & Plan Note (Signed)
Labs

## 2019-08-02 NOTE — Assessment & Plan Note (Signed)
On Coreg, Spironolactone

## 2019-08-02 NOTE — Progress Notes (Signed)
Subjective:  Patient ID: Brandy Gonzales, female    DOB: [birth year]  Age: 79 y.o. MRN: [id number]  CC: No chief complaint on file.   HPI Brandy Gonzales presents for HTN, DM, CRI f/u  Outpatient Medications Prior to Visit  Medication Sig Dispense Refill  . acetaminophen (TYLENOL) 500 MG tablet Take 500 mg by mouth every 6 (six) hours as needed.    Marland Kitchen aspirin 81 MG EC tablet Take 81 mg by mouth daily.      . carvedilol (COREG) 25 MG tablet Take 1 tablet (25 mg total) by mouth 2 (two) times daily with a meal. 180 tablet 3  . cephALEXin (KEFLEX) 500 MG capsule Take 1 capsule (500 mg total) by mouth 2 (two) times daily. 10 capsule 0  . cetirizine (ZYRTEC) 5 MG tablet Take 1 tablet (5 mg total) by mouth daily. 10 tablet 0  . glimepiride (AMARYL) 2 MG tablet TAKE 1 TABLET BY MOUTH ONCE DAILY BEFORE BREAKFAST 90 tablet 1  . omeprazole (PRILOSEC) 20 MG capsule TAKE 1 CAPSULE BY MOUTH  DAILY 90 capsule 3  . predniSONE (DELTASONE) 20 MG tablet Take 1 tablet (20 mg total) by mouth 2 (two) times daily with a meal. 10 tablet 0  . spironolactone (ALDACTONE) 50 MG tablet TAKE 1 TABLET BY MOUTH ONCE DAILY FOR  HIGH  BLOOD  PRESSURE 90 tablet 3   No facility-administered medications prior to visit.    ROS: Review of Systems  Constitutional: Negative for activity change, appetite change, chills, fatigue and unexpected weight change.  HENT: Negative for congestion, mouth sores and sinus pressure.   Eyes: Negative for visual disturbance.  Respiratory: Negative for cough and chest tightness.   Gastrointestinal: Negative for abdominal pain and nausea.  Genitourinary: Negative for difficulty urinating, frequency and vaginal pain.  Musculoskeletal: Positive for arthralgias and gait problem. Negative for back pain.  Skin: Negative for pallor and rash.  Neurological: Negative for dizziness, tremors, weakness, numbness and headaches.  Psychiatric/Behavioral: Negative for confusion, sleep disturbance and suicidal  ideas.    Objective:  BP 136/88 (BP Location: Right Arm, Patient Position: Sitting, Cuff Size: Normal)   Pulse 78   Temp 97.8 F (36.6 C) (Oral)   Ht 4\' 11"  (1.499 m)   Wt 144 lb 8 oz (65.5 kg)   SpO2 96%   BMI 29.19 kg/m   BP Readings from Last 3 Encounters:  08/02/19 136/88  05/08/19 (!) 163/81  04/04/19 (!) 146/72    Wt Readings from Last 3 Encounters:  08/02/19 144 lb 8 oz (65.5 kg)  05/08/19 142 lb (64.4 kg)  04/04/19 142 lb (64.4 kg)    Physical Exam Constitutional:      General: She is not in acute distress.    Appearance: She is well-developed.  HENT:     Head: Normocephalic.     Right Ear: External ear normal.     Left Ear: External ear normal.     Nose: Nose normal.  Eyes:     General:        Right eye: No discharge.        Left eye: No discharge.     Conjunctiva/sclera: Conjunctivae normal.     Pupils: Pupils are equal, round, and reactive to light.  Neck:     Thyroid: No thyromegaly.     Vascular: No JVD.     Trachea: No tracheal deviation.  Cardiovascular:     Rate and Rhythm: Normal rate and regular rhythm.  Heart sounds: Normal heart sounds.  Pulmonary:     Effort: No respiratory distress.     Breath sounds: No stridor. No wheezing.  Abdominal:     General: Bowel sounds are normal. There is no distension.     Palpations: Abdomen is soft. There is no mass.     Tenderness: There is no abdominal tenderness. There is no guarding or rebound.  Musculoskeletal:        General: No tenderness.     Cervical back: Normal range of motion and neck supple.  Lymphadenopathy:     Cervical: No cervical adenopathy.  Skin:    Findings: No erythema or rash.  Neurological:     Cranial Nerves: No cranial nerve deficit.     Motor: No abnormal muscle tone.     Coordination: Coordination normal.     Gait: Gait abnormal.     Deep Tendon Reflexes: Reflexes normal.  Psychiatric:        Behavior: Behavior normal.        Thought Content: Thought content  normal.        Judgment: Judgment normal.   limping  Lab Results  Component Value Date   WBC 7.5 05/03/2019   HGB 12.9 05/03/2019   HCT 39.2 05/03/2019   PLT 174 05/03/2019   GLUCOSE 141 (H) 07/31/2019   CHOL 188 10/25/2015   TRIG 129.0 10/25/2015   HDL 39.40 10/25/2015   LDLDIRECT 78.9 10/22/2008   LDLCALC 123 (H) 10/25/2015   ALT 17 05/03/2019   AST 17 05/03/2019   NA 142 07/31/2019   K 4.7 07/31/2019   CL 109 07/31/2019   CREATININE 1.43 (H) 07/31/2019   BUN 17 07/31/2019   CO2 26 07/31/2019   TSH 2.69 10/25/2015   INR 1.03 08/16/2011   HGBA1C 6.7 (H) 07/31/2019   MICROALBUR 0.7 08/08/2015    DG Hip Unilat With Pelvis 2-3 Views Right  Result Date: 05/08/2019 CLINICAL DATA:  Right hip pain for 4 days.  No known injury. EXAM: DG HIP (WITH OR WITHOUT PELVIS) 2-3V RIGHT COMPARISON:  None. FINDINGS: There is no evidence of hip fracture or dislocation. There is no evidence of arthropathy or other focal bone abnormality. IMPRESSION: Negative exam. Electronically Signed   By: Inge Rise M.D.   On: 05/08/2019 13:38    Assessment & Plan:   There are no diagnoses linked to this encounter.   No orders of the defined types were placed in this encounter.    Follow-up: No follow-ups on file.  Walker Kehr, MD

## 2019-08-02 NOTE — Assessment & Plan Note (Signed)
On Glimeperide

## 2019-10-11 DIAGNOSIS — H04123 Dry eye syndrome of bilateral lacrimal glands: Secondary | ICD-10-CM | POA: Diagnosis not present

## 2019-10-11 DIAGNOSIS — H26493 Other secondary cataract, bilateral: Secondary | ICD-10-CM | POA: Diagnosis not present

## 2019-10-11 DIAGNOSIS — H35373 Puckering of macula, bilateral: Secondary | ICD-10-CM | POA: Diagnosis not present

## 2019-10-11 DIAGNOSIS — Z961 Presence of intraocular lens: Secondary | ICD-10-CM | POA: Diagnosis not present

## 2019-11-06 ENCOUNTER — Other Ambulatory Visit: Payer: Self-pay | Admitting: Internal Medicine

## 2019-12-06 ENCOUNTER — Ambulatory Visit (INDEPENDENT_AMBULATORY_CARE_PROVIDER_SITE_OTHER): Payer: Medicare Other | Admitting: Internal Medicine

## 2019-12-06 ENCOUNTER — Encounter: Payer: Self-pay | Admitting: Internal Medicine

## 2019-12-06 ENCOUNTER — Ambulatory Visit (INDEPENDENT_AMBULATORY_CARE_PROVIDER_SITE_OTHER): Payer: Medicare Other

## 2019-12-06 ENCOUNTER — Other Ambulatory Visit: Payer: Self-pay

## 2019-12-06 VITALS — BP 120/90 | HR 80 | Temp 98.0°F | Resp 16 | Ht 59.0 in | Wt 150.4 lb

## 2019-12-06 DIAGNOSIS — N259 Disorder resulting from impaired renal tubular function, unspecified: Secondary | ICD-10-CM | POA: Diagnosis not present

## 2019-12-06 DIAGNOSIS — N183 Chronic kidney disease, stage 3 unspecified: Secondary | ICD-10-CM

## 2019-12-06 DIAGNOSIS — E1121 Type 2 diabetes mellitus with diabetic nephropathy: Secondary | ICD-10-CM

## 2019-12-06 DIAGNOSIS — E119 Type 2 diabetes mellitus without complications: Secondary | ICD-10-CM

## 2019-12-06 DIAGNOSIS — Z0000 Encounter for general adult medical examination without abnormal findings: Secondary | ICD-10-CM | POA: Diagnosis not present

## 2019-12-06 DIAGNOSIS — I10 Essential (primary) hypertension: Secondary | ICD-10-CM

## 2019-12-06 DIAGNOSIS — N2889 Other specified disorders of kidney and ureter: Secondary | ICD-10-CM

## 2019-12-06 DIAGNOSIS — E785 Hyperlipidemia, unspecified: Secondary | ICD-10-CM

## 2019-12-06 DIAGNOSIS — R609 Edema, unspecified: Secondary | ICD-10-CM | POA: Diagnosis not present

## 2019-12-06 LAB — URINALYSIS, ROUTINE W REFLEX MICROSCOPIC
Bilirubin Urine: NEGATIVE
Hgb urine dipstick: NEGATIVE
Ketones, ur: NEGATIVE
Nitrite: POSITIVE — AB
Specific Gravity, Urine: 1.015 (ref 1.000–1.030)
Total Protein, Urine: NEGATIVE
Urine Glucose: NEGATIVE
Urobilinogen, UA: 0.2 (ref 0.0–1.0)
pH: 6 (ref 5.0–8.0)

## 2019-12-06 LAB — CBC WITH DIFFERENTIAL/PLATELET
Basophils Absolute: 0.1 10*3/uL (ref 0.0–0.1)
Basophils Relative: 0.8 % (ref 0.0–3.0)
Eosinophils Absolute: 0.2 10*3/uL (ref 0.0–0.7)
Eosinophils Relative: 3.2 % (ref 0.0–5.0)
HCT: 36.8 % (ref 36.0–46.0)
Hemoglobin: 12.5 g/dL (ref 12.0–15.0)
Lymphocytes Relative: 32.4 % (ref 12.0–46.0)
Lymphs Abs: 2.2 10*3/uL (ref 0.7–4.0)
MCHC: 33.9 g/dL (ref 30.0–36.0)
MCV: 91.6 fl (ref 78.0–100.0)
Monocytes Absolute: 0.5 10*3/uL (ref 0.1–1.0)
Monocytes Relative: 8 % (ref 3.0–12.0)
Neutro Abs: 3.7 10*3/uL (ref 1.4–7.7)
Neutrophils Relative %: 55.6 % (ref 43.0–77.0)
Platelets: 153 10*3/uL (ref 150.0–400.0)
RBC: 4.01 Mil/uL (ref 3.87–5.11)
RDW: 13.5 % (ref 11.5–15.5)
WBC: 6.7 10*3/uL (ref 4.0–10.5)

## 2019-12-06 LAB — TSH: TSH: 1.71 u[IU]/mL (ref 0.35–4.50)

## 2019-12-06 LAB — BASIC METABOLIC PANEL
BUN: 27 mg/dL — ABNORMAL HIGH (ref 6–23)
CO2: 28 mEq/L (ref 19–32)
Calcium: 9.9 mg/dL (ref 8.4–10.5)
Chloride: 107 mEq/L (ref 96–112)
Creatinine, Ser: 1.73 mg/dL — ABNORMAL HIGH (ref 0.40–1.20)
GFR: 34.3 mL/min — ABNORMAL LOW (ref >60.00)
Glucose, Bld: 233 mg/dL — ABNORMAL HIGH (ref 70–99)
Potassium: 4.9 mEq/L (ref 3.5–5.1)
Sodium: 141 mEq/L (ref 135–145)

## 2019-12-06 LAB — HEPATIC FUNCTION PANEL
ALT: 15 U/L (ref 0–35)
AST: 15 U/L (ref 0–37)
Albumin: 3.9 g/dL (ref 3.5–5.2)
Alkaline Phosphatase: 87 U/L (ref 39–117)
Bilirubin, Direct: 0.1 mg/dL (ref 0.0–0.3)
Total Bilirubin: 0.7 mg/dL (ref 0.2–1.2)
Total Protein: 6.6 g/dL (ref 6.0–8.3)

## 2019-12-06 LAB — LIPID PANEL
Cholesterol: 156 mg/dL (ref 0–200)
HDL: 38.1 mg/dL — ABNORMAL LOW (ref >39.00)
LDL Cholesterol: 83 mg/dL (ref 0–99)
NonHDL: 118.2
Total CHOL/HDL Ratio: 4
Triglycerides: 177 mg/dL — ABNORMAL HIGH (ref 0.0–149.0)
VLDL: 35.4 mg/dL (ref 0.0–40.0)

## 2019-12-06 LAB — HEMOGLOBIN A1C: Hgb A1c MFr Bld: 7.3 % — ABNORMAL HIGH (ref 4.6–6.5)

## 2019-12-06 NOTE — Assessment & Plan Note (Signed)
resolved 

## 2019-12-06 NOTE — Progress Notes (Signed)
Subjective:  Patient ID: Brandy Gonzales, female    DOB: [birth year]  Age: 79 y.o. MRN: [id number]  CC: No chief complaint on file.   HPI Brandy Gonzales presents for DM, HTN, OA f/u  Outpatient Medications Prior to Visit  Medication Sig Dispense Refill  . acetaminophen (TYLENOL) 500 MG tablet Take 500 mg by mouth every 6 (six) hours as needed.    Marland Kitchen aspirin 81 MG EC tablet Take 81 mg by mouth daily.      . carvedilol (COREG) 25 MG tablet TAKE 1 TABLET BY MOUTH TWICE DAILY WITH MEALS 180 tablet 0  . cephALEXin (KEFLEX) 500 MG capsule Take 1 capsule (500 mg total) by mouth 2 (two) times daily. 10 capsule 0  . cetirizine (ZYRTEC) 5 MG tablet Take 1 tablet (5 mg total) by mouth daily. 10 tablet 0  . Cholecalciferol (VITAMIN D3) 50 MCG (2000 UT) capsule Take 1 capsule (2,000 Units total) by mouth daily. 100 capsule 3  . glimepiride (AMARYL) 2 MG tablet TAKE 1 TABLET BY MOUTH ONCE DAILY BEFORE BREAKFAST 90 tablet 0  . omeprazole (PRILOSEC) 20 MG capsule TAKE 1 CAPSULE BY MOUTH  DAILY 90 capsule 3  . spironolactone (ALDACTONE) 50 MG tablet TAKE 1 TABLET BY MOUTH ONCE DAILY FOR  HIGH  BLOOD  PRESSURE 90 tablet 3   No facility-administered medications prior to visit.    ROS: Review of Systems  Constitutional: Negative for activity change, appetite change, chills, fatigue and unexpected weight change.  HENT: Negative for congestion, mouth sores and sinus pressure.   Eyes: Negative for visual disturbance.  Respiratory: Negative for cough and chest tightness.   Gastrointestinal: Negative for abdominal pain and nausea.  Genitourinary: Negative for difficulty urinating, frequency and vaginal pain.  Musculoskeletal: Positive for back pain and gait problem.  Skin: Negative for pallor and rash.  Neurological: Negative for dizziness, tremors, weakness, numbness and headaches.  Psychiatric/Behavioral: Negative for confusion, sleep disturbance and suicidal ideas.    Objective:  There were no vitals  taken for this visit.  BP Readings from Last 3 Encounters:  12/06/19 (!) 120/90  08/02/19 136/88  05/08/19 (!) 163/81    Wt Readings from Last 3 Encounters:  12/06/19 150 lb 6.4 oz (68.2 kg)  08/02/19 144 lb 8 oz (65.5 kg)  05/08/19 142 lb (64.4 kg)    Physical Exam Constitutional:      General: She is not in acute distress.    Appearance: She is well-developed. She is obese.  HENT:     Head: Normocephalic.     Right Ear: External ear normal.     Left Ear: External ear normal.     Nose: Nose normal.  Eyes:     General:        Right eye: No discharge.        Left eye: No discharge.     Conjunctiva/sclera: Conjunctivae normal.     Pupils: Pupils are equal, round, and reactive to light.  Neck:     Thyroid: No thyromegaly.     Vascular: No JVD.     Trachea: No tracheal deviation.  Cardiovascular:     Rate and Rhythm: Normal rate and regular rhythm.     Heart sounds: Normal heart sounds.  Pulmonary:     Effort: No respiratory distress.     Breath sounds: No stridor. No wheezing.  Abdominal:     General: Bowel sounds are normal. There is no distension.     Palpations: Abdomen is  soft. There is no mass.     Tenderness: There is no abdominal tenderness. There is no guarding or rebound.  Musculoskeletal:        General: No tenderness.     Cervical back: Normal range of motion and neck supple.     Right lower leg: No edema.     Left lower leg: No edema.  Lymphadenopathy:     Cervical: No cervical adenopathy.  Skin:    Findings: No erythema or rash.  Neurological:     Mental Status: She is oriented to person, place, and time.     Cranial Nerves: No cranial nerve deficit.     Motor: No abnormal muscle tone.     Coordination: Coordination normal.     Deep Tendon Reflexes: Reflexes normal.  Psychiatric:        Behavior: Behavior normal.        Thought Content: Thought content normal.        Judgment: Judgment normal.   cane Limping   Lab Results  Component Value  Date   WBC 7.5 05/03/2019   HGB 12.9 05/03/2019   HCT 39.2 05/03/2019   PLT 174 05/03/2019   GLUCOSE 141 (H) 07/31/2019   CHOL 188 10/25/2015   TRIG 129.0 10/25/2015   HDL 39.40 10/25/2015   LDLDIRECT 78.9 10/22/2008   LDLCALC 123 (H) 10/25/2015   ALT 17 05/03/2019   AST 17 05/03/2019   NA 142 07/31/2019   K 4.7 07/31/2019   CL 109 07/31/2019   CREATININE 1.43 (H) 07/31/2019   BUN 17 07/31/2019   CO2 26 07/31/2019   TSH 2.69 10/25/2015   INR 1.03 08/16/2011   HGBA1C 6.7 (H) 07/31/2019   MICROALBUR 0.7 08/08/2015    DG Hip Unilat With Pelvis 2-3 Views Right  Result Date: 05/08/2019 CLINICAL DATA:  Right hip pain for 4 days.  No known injury. EXAM: DG HIP (WITH OR WITHOUT PELVIS) 2-3V RIGHT COMPARISON:  None. FINDINGS: There is no evidence of hip fracture or dislocation. There is no evidence of arthropathy or other focal bone abnormality. IMPRESSION: Negative exam. Electronically Signed   By: Inge Rise M.D.   On: 05/08/2019 13:38    Assessment & Plan:     Follow-up: No follow-ups on file.  Walker Kehr, MD

## 2019-12-06 NOTE — Assessment & Plan Note (Signed)
Coreg Spironolactone
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
## 2019-12-06 NOTE — Patient Instructions (Addendum)
[name] , Thank you for taking time to come for your Medicare Wellness Visit. I appreciate your ongoing commitment to your health goals. Please review the following plan we discussed and let me know if I can assist you in the future.   Screening recommendations/referrals: Colonoscopy: no repeat due to age 79: no repeat due to age Bone Density: never done Recommended yearly ophthalmology/optometry visit for glaucoma screening and checkup Recommended yearly dental visit for hygiene and checkup  Vaccinations: Influenza vaccine: declined Pneumococcal vaccine: completed Tdap vaccine: 11/01/2014 Shingles vaccine: declined   Covid-19: declined  Advanced directives: Advance directive discussed with you today. Even though you declined this today please call our office should you change your mind and we can give you the proper paperwork for you to fill out.   Conditions/risks identified: Yes; Please continue to do your personal lifestyle choices by: daily care of teeth and gums, regular physical activity (goal should be 5 days a week for 30 minutes), eat a healthy diet, avoid tobacco and drug use, limiting any alcohol intake, taking a low-dose aspirin (if not allergic or have been advised by your provider otherwise) and taking vitamins and minerals as recommended by your provider. Continue doing brain stimulating activities (puzzles, reading, adult coloring books, staying active) to keep memory sharp. Continue to eat heart healthy diet (full of fruits, vegetables, whole grains, lean protein, water--limit salt, fat, and sugar intake) and increase physical activity as tolerated.  Next appointment: Please schedule your next Medicare Wellness Visit with your Nurse Health Advisor in 1 year.   Preventive Care 31 Years and Older, Female Preventive care refers to lifestyle choices and visits with your health care provider that can promote health and wellness. What does preventive care include?  A  yearly physical exam. This is also called an annual well check.  Dental exams once or twice a year.  Routine eye exams. Ask your health care provider how often you should have your eyes checked.  Personal lifestyle choices, including:  Daily care of your teeth and gums.  Regular physical activity.  Eating a healthy diet.  Avoiding tobacco and drug use.  Limiting alcohol use.  Practicing safe sex.  Taking low-dose aspirin every day.  Taking vitamin and mineral supplements as recommended by your health care provider. What happens during an annual well check? The services and screenings done by your health care provider during your annual well check will depend on your age, overall health, lifestyle risk factors, and family history of disease. Counseling  Your health care provider may ask you questions about your:  Alcohol use.  Tobacco use.  Drug use.  Emotional well-being.  Home and relationship well-being.  Sexual activity.  Eating habits.  History of falls.  Memory and ability to understand (cognition).  Work and work Statistician.  Reproductive health. Screening  You may have the following tests or measurements:  Height, weight, and BMI.  Blood pressure.  Lipid and cholesterol levels. These may be checked every 5 years, or more frequently if you are over 25 years old.  Skin check.  Lung cancer screening. You may have this screening every year starting at age 59 if you have a 30-pack-year history of smoking and currently smoke or have quit within the past 15 years.  Fecal occult blood test (FOBT) of the stool. You may have this test every year starting at age 41.  Flexible sigmoidoscopy or colonoscopy. You may have a sigmoidoscopy every 5 years or a colonoscopy every 10 years  starting at age 80.  Hepatitis C blood test.  Hepatitis B blood test.  Sexually transmitted disease (STD) testing.  Diabetes screening. This is done by checking your blood  sugar (glucose) after you have not eaten for a while (fasting). You may have this done every 1-3 years.  Bone density scan. This is done to screen for osteoporosis. You may have this done starting at age 55.  Mammogram. This may be done every 1-2 years. Talk to your health care provider about how often you should have regular mammograms. Talk with your health care provider about your test results, treatment options, and if necessary, the need for more tests. Vaccines  Your health care provider may recommend certain vaccines, such as:  Influenza vaccine. This is recommended every year.  Tetanus, diphtheria, and acellular pertussis (Tdap, Td) vaccine. You may need a Td booster every 10 years.  Zoster vaccine. You may need this after age 22.  Pneumococcal 13-valent conjugate (PCV13) vaccine. One dose is recommended after age 50.  Pneumococcal polysaccharide (PPSV23) vaccine. One dose is recommended after age 70. Talk to your health care provider about which screenings and vaccines you need and how often you need them. This information is not intended to replace advice given to you by your health care provider. Make sure you discuss any questions you have with your health care provider. Document Released: 05/24/2015 Document Revised: 01/15/2016 Document Reviewed: 02/26/2015 Elsevier Interactive Patient Education  2017 [place] Prevention in the Home Falls can cause injuries. They can happen to people of all ages. There are many things you can do to make your home safe and to help prevent falls. What can I do on the outside of my home?  Regularly fix the edges of walkways and driveways and fix any cracks.  Remove anything that might make you trip as you walk through a door, such as a raised step or threshold.  Trim any bushes or trees on the path to your home.  Use bright outdoor lighting.  Clear any walking paths of anything that might make someone trip, such as rocks or  tools.  Regularly check to see if handrails are loose or broken. Make sure that both sides of any steps have handrails.  Any raised decks and porches should have guardrails on the edges.  Have any leaves, snow, or ice cleared regularly.  Use sand or salt on walking paths during winter.  Clean up any spills in your garage right away. This includes oil or grease spills. What can I do in the bathroom?  Use night lights.  Install grab bars by the toilet and in the tub and shower. Do not use towel bars as grab bars.  Use non-skid mats or decals in the tub or shower.  If you need to sit down in the shower, use a plastic, non-slip stool.  Keep the floor dry. Clean up any water that spills on the floor as soon as it happens.  Remove soap buildup in the tub or shower regularly.  Attach bath mats securely with double-sided non-slip rug tape.  Do not have throw rugs and other things on the floor that can make you trip. What can I do in the bedroom?  Use night lights.  Make sure that you have a light by your bed that is easy to reach.  Do not use any sheets or blankets that are too big for your bed. They should not hang down onto the floor.  Have a  firm chair that has side arms. You can use this for support while you get dressed.  Do not have throw rugs and other things on the floor that can make you trip. What can I do in the kitchen?  Clean up any spills right away.  Avoid walking on wet floors.  Keep items that you use a lot in easy-to-reach places.  If you need to reach something above you, use a strong step stool that has a grab bar.  Keep electrical cords out of the way.  Do not use floor polish or wax that makes floors slippery. If you must use wax, use non-skid floor wax.  Do not have throw rugs and other things on the floor that can make you trip. What can I do with my stairs?  Do not leave any items on the stairs.  Make sure that there are handrails on both  sides of the stairs and use them. Fix handrails that are broken or loose. Make sure that handrails are as long as the stairways.  Check any carpeting to make sure that it is firmly attached to the stairs. Fix any carpet that is loose or worn.  Avoid having throw rugs at the top or bottom of the stairs. If you do have throw rugs, attach them to the floor with carpet tape.  Make sure that you have a light switch at the top of the stairs and the bottom of the stairs. If you do not have them, ask someone to add them for you. What else can I do to help prevent falls?  Wear shoes that:  Do not have high heels.  Have rubber bottoms.  Are comfortable and fit you well.  Are closed at the toe. Do not wear sandals.  If you use a stepladder:  Make sure that it is fully opened. Do not climb a closed stepladder.  Make sure that both sides of the stepladder are locked into place.  Ask someone to hold it for you, if possible.  Clearly mark and make sure that you can see:  Any grab bars or handrails.  First and last steps.  Where the edge of each step is.  Use tools that help you move around (mobility aids) if they are needed. These include:  Canes.  Walkers.  Scooters.  Crutches.  Turn on the lights when you go into a dark area. Replace any light bulbs as soon as they burn out.  Set up your furniture so you have a clear path. Avoid moving your furniture around.  If any of your floors are uneven, fix them.  If there are any pets around you, be aware of where they are.  Review your medicines with your doctor. Some medicines can make you feel dizzy. This can increase your chance of falling. Ask your doctor what other things that you can do to help prevent falls. This information is not intended to replace advice given to you by your health care provider. Make sure you discuss any questions you have with your health care provider. Document Released: 02/21/2009 Document Revised:  10/03/2015 Document Reviewed: 06/01/2014 Elsevier Interactive Patient Education  2017 [employer].

## 2019-12-06 NOTE — Assessment & Plan Note (Signed)
Labs

## 2019-12-06 NOTE — Assessment & Plan Note (Signed)
Glimipiride

## 2019-12-10 ENCOUNTER — Other Ambulatory Visit: Payer: Self-pay | Admitting: Internal Medicine

## 2019-12-10 MED ORDER — CIPROFLOXACIN HCL 250 MG PO TABS: 250.0000 mg | ORAL_TABLET | Freq: Two times a day (BID) | ORAL | 0 refills | Status: DC

## 2020-02-01 ENCOUNTER — Other Ambulatory Visit: Payer: Self-pay | Admitting: Internal Medicine

## 2020-02-09 DEATH — deceased

## 2020-04-10 ENCOUNTER — Other Ambulatory Visit: Payer: Self-pay

## 2020-04-11 ENCOUNTER — Ambulatory Visit (INDEPENDENT_AMBULATORY_CARE_PROVIDER_SITE_OTHER): Payer: Medicare Other | Admitting: Internal Medicine

## 2020-04-11 ENCOUNTER — Encounter: Payer: Self-pay | Admitting: Internal Medicine

## 2020-04-11 VITALS — BP 132/84 | HR 74 | Temp 97.7°F | Wt 146.2 lb

## 2020-04-11 DIAGNOSIS — N183 Chronic kidney disease, stage 3 unspecified: Secondary | ICD-10-CM

## 2020-04-11 DIAGNOSIS — E785 Hyperlipidemia, unspecified: Secondary | ICD-10-CM | POA: Diagnosis not present

## 2020-04-11 DIAGNOSIS — E1121 Type 2 diabetes mellitus with diabetic nephropathy: Secondary | ICD-10-CM | POA: Diagnosis not present

## 2020-04-11 DIAGNOSIS — I10 Essential (primary) hypertension: Secondary | ICD-10-CM

## 2020-04-11 DIAGNOSIS — M79604 Pain in right leg: Secondary | ICD-10-CM

## 2020-04-11 LAB — TSH: TSH: 1.18 u[IU]/mL (ref 0.35–4.50)

## 2020-04-11 LAB — COMPREHENSIVE METABOLIC PANEL
ALT: 13 U/L (ref 0–35)
AST: 16 U/L (ref 0–37)
Albumin: 3.9 g/dL (ref 3.5–5.2)
Alkaline Phosphatase: 64 U/L (ref 39–117)
BUN: 18 mg/dL (ref 6–23)
CO2: 28 mEq/L (ref 19–32)
Calcium: 10.2 mg/dL (ref 8.4–10.5)
Chloride: 107 mEq/L (ref 96–112)
Creatinine, Ser: 1.63 mg/dL — ABNORMAL HIGH (ref 0.40–1.20)
GFR: 29.65 mL/min — ABNORMAL LOW (ref >60.00)
Glucose, Bld: 250 mg/dL — ABNORMAL HIGH (ref 70–99)
Potassium: 4.4 mEq/L (ref 3.5–5.1)
Sodium: 141 mEq/L (ref 135–145)
Total Bilirubin: 0.7 mg/dL (ref 0.2–1.2)
Total Protein: 6.4 g/dL (ref 6.0–8.3)

## 2020-04-11 LAB — HEMOGLOBIN A1C: Hgb A1c MFr Bld: 7.3 % — ABNORMAL HIGH (ref 4.6–6.5)

## 2020-04-11 MED ORDER — PRAVASTATIN SODIUM 20 MG PO TABS: 20.0000 mg | ORAL_TABLET | Freq: Every day | ORAL | 11 refills | Status: DC

## 2020-04-11 NOTE — Assessment & Plan Note (Signed)
Chronic Using a cane

## 2020-04-11 NOTE — Assessment & Plan Note (Signed)
Cont w/Coreg, Spironolactone Labs

## 2020-04-11 NOTE — Addendum Note (Signed)
Addended by: Cresenciano Lick on: 04/11/2020 02:07 PM   Modules accepted: Orders

## 2020-04-11 NOTE — Assessment & Plan Note (Signed)
Glimeperide Labs

## 2020-04-11 NOTE — Assessment & Plan Note (Signed)
Try Pravachol

## 2020-04-11 NOTE — Assessment & Plan Note (Signed)
Labs

## 2020-04-11 NOTE — Progress Notes (Signed)
Subjective:  Patient ID: Brandy Gonzales, female    DOB: May 03, 1941  Age: 79 y.o. MRN: [id number]  CC: Follow-up (4 month f/u)   HPI Brandy Gonzales presents for HTN, DM, CRI f/u  Outpatient Medications Prior to Visit  Medication Sig Dispense Refill  . acetaminophen (TYLENOL) 500 MG tablet Take 500 mg by mouth every 6 (six) hours as needed.    Marland Kitchen aspirin 81 MG EC tablet Take 81 mg by mouth daily.      . carvedilol (COREG) 25 MG tablet TAKE 1 TABLET BY MOUTH TWICE DAILY WITH MEALS 180 tablet 0  . cetirizine (ZYRTEC) 5 MG tablet Take 1 tablet (5 mg total) by mouth daily. 10 tablet 0  . Cholecalciferol (VITAMIN D3) 50 MCG (2000 UT) capsule Take 1 capsule (2,000 Units total) by mouth daily. 100 capsule 3  . glimepiride (AMARYL) 2 MG tablet TAKE 1 TABLET BY MOUTH ONCE DAILY BEFORE BREAKFAST 90 tablet 0  . omeprazole (PRILOSEC) 20 MG capsule TAKE 1 CAPSULE BY MOUTH DAILY 90 capsule 0  . spironolactone (ALDACTONE) 50 MG tablet TAKE 1 TABLET BY MOUTH ONCE DAILY FOR HIGH BLOOD PRESSURE 90 tablet 0  . cephALEXin (KEFLEX) 500 MG capsule Take 1 capsule (500 mg total) by mouth 2 (two) times daily. (Patient not taking: Reported on 04/11/2020) 10 capsule 0  . ciprofloxacin (CIPRO) 250 MG tablet Take 1 tablet (250 mg total) by mouth 2 (two) times daily. (Patient not taking: Reported on 04/11/2020) 10 tablet 0   No facility-administered medications prior to visit.    ROS: Review of Systems  Constitutional: Negative for activity change, appetite change, chills, fatigue and unexpected weight change.  HENT: Negative for congestion, mouth sores and sinus pressure.   Eyes: Negative for visual disturbance.  Respiratory: Negative for cough and chest tightness.   Gastrointestinal: Negative for abdominal pain and nausea.  Genitourinary: Negative for difficulty urinating, frequency and vaginal pain.  Musculoskeletal: Positive for arthralgias and gait problem. Negative for back pain.  Skin: Negative for pallor and  rash.  Neurological: Negative for dizziness, tremors, weakness, numbness and headaches.  Psychiatric/Behavioral: Negative for confusion and sleep disturbance.    Objective:  BP 132/84 (BP Location: Left Arm)   Pulse 74   Temp 97.7 F (36.5 C) (Oral)   Wt 146 lb 3.2 oz (66.3 kg)   SpO2 98%   BMI 29.53 kg/m   BP Readings from Last 3 Encounters:  04/11/20 132/84  12/06/19 (!) 120/90  08/02/19 136/88    Wt Readings from Last 3 Encounters:  04/11/20 146 lb 3.2 oz (66.3 kg)  12/06/19 150 lb 6.4 oz (68.2 kg)  08/02/19 144 lb 8 oz (65.5 kg)    Physical Exam Constitutional:      General: She is not in acute distress.    Appearance: She is well-developed. She is obese.  HENT:     Head: Normocephalic.     Right Ear: External ear normal.     Left Ear: External ear normal.     Nose: Nose normal.  Eyes:     General:        Right eye: No discharge.        Left eye: No discharge.     Conjunctiva/sclera: Conjunctivae normal.     Pupils: Pupils are equal, round, and reactive to light.  Neck:     Thyroid: No thyromegaly.     Vascular: No JVD.     Trachea: No tracheal deviation.  Cardiovascular:  Rate and Rhythm: Normal rate and regular rhythm.     Heart sounds: Normal heart sounds.  Pulmonary:     Effort: No respiratory distress.     Breath sounds: No stridor. No wheezing.  Abdominal:     General: Bowel sounds are normal. There is no distension.     Palpations: Abdomen is soft. There is no mass.     Tenderness: There is no abdominal tenderness. There is no guarding or rebound.  Musculoskeletal:        General: No tenderness.     Cervical back: Normal range of motion and neck supple.  Lymphadenopathy:     Cervical: No cervical adenopathy.  Skin:    Findings: No erythema or rash.  Neurological:     Mental Status: She is oriented to person, place, and time.     Cranial Nerves: No cranial nerve deficit.     Motor: No abnormal muscle tone.     Coordination: Coordination  abnormal.     Deep Tendon Reflexes: Reflexes normal.  Psychiatric:        Behavior: Behavior normal.        Thought Content: Thought content normal.        Judgment: Judgment normal.   cane  Lab Results  Component Value Date   WBC 6.7 12/06/2019   HGB 12.5 12/06/2019   HCT 36.8 12/06/2019   PLT 153.0 12/06/2019   GLUCOSE 233 (H) 12/06/2019   CHOL 156 12/06/2019   TRIG 177.0 (H) 12/06/2019   HDL 38.10 (L) 12/06/2019   LDLDIRECT 78.9 10/22/2008   LDLCALC 83 12/06/2019   ALT 15 12/06/2019   AST 15 12/06/2019   NA 141 12/06/2019   K 4.9 12/06/2019   CL 107 12/06/2019   CREATININE 1.73 (H) 12/06/2019   BUN 27 (H) 12/06/2019   CO2 28 12/06/2019   TSH 1.71 12/06/2019   INR 1.03 08/16/2011   HGBA1C 7.3 (H) 12/06/2019   MICROALBUR 0.7 08/08/2015    DG Hip Unilat With Pelvis 2-3 Views Right  Result Date: 05/08/2019 CLINICAL DATA:  Right hip pain for 4 days.  No known injury. EXAM: DG HIP (WITH OR WITHOUT PELVIS) 2-3V RIGHT COMPARISON:  None. FINDINGS: There is no evidence of hip fracture or dislocation. There is no evidence of arthropathy or other focal bone abnormality. IMPRESSION: Negative exam. Electronically Signed   By: Inge Rise M.D.   On: 05/08/2019 13:38    Assessment & Plan:    Walker Kehr, MD

## 2020-04-15 ENCOUNTER — Telehealth: Payer: Self-pay | Admitting: Internal Medicine

## 2020-04-15 NOTE — Telephone Encounter (Signed)
Called pt see result note.Marland KitchenJohny Gonzales

## 2020-04-15 NOTE — Telephone Encounter (Signed)
Patient called and was wanting a call back from her recent lab results. She can be reached at [phone]

## 2020-04-29 ENCOUNTER — Other Ambulatory Visit: Payer: Self-pay | Admitting: Internal Medicine

## 2020-04-30 ENCOUNTER — Other Ambulatory Visit: Payer: Self-pay | Admitting: Internal Medicine

## 2020-08-12 ENCOUNTER — Encounter: Payer: Self-pay | Admitting: Internal Medicine

## 2020-08-12 ENCOUNTER — Other Ambulatory Visit: Payer: Self-pay

## 2020-08-12 ENCOUNTER — Ambulatory Visit (INDEPENDENT_AMBULATORY_CARE_PROVIDER_SITE_OTHER): Payer: Medicare Other | Admitting: Internal Medicine

## 2020-08-12 DIAGNOSIS — I10 Essential (primary) hypertension: Secondary | ICD-10-CM | POA: Diagnosis not present

## 2020-08-12 DIAGNOSIS — L219 Seborrheic dermatitis, unspecified: Secondary | ICD-10-CM | POA: Diagnosis not present

## 2020-08-12 DIAGNOSIS — N183 Chronic kidney disease, stage 3 unspecified: Secondary | ICD-10-CM

## 2020-08-12 DIAGNOSIS — E1121 Type 2 diabetes mellitus with diabetic nephropathy: Secondary | ICD-10-CM

## 2020-08-12 DIAGNOSIS — M172 Bilateral post-traumatic osteoarthritis of knee: Secondary | ICD-10-CM

## 2020-08-12 DIAGNOSIS — E785 Hyperlipidemia, unspecified: Secondary | ICD-10-CM

## 2020-08-12 DIAGNOSIS — R635 Abnormal weight gain: Secondary | ICD-10-CM

## 2020-08-12 LAB — COMPREHENSIVE METABOLIC PANEL
ALT: 13 U/L (ref 0–35)
AST: 16 U/L (ref 0–37)
Albumin: 4.1 g/dL (ref 3.5–5.2)
Alkaline Phosphatase: 66 U/L (ref 39–117)
BUN: 22 mg/dL (ref 6–23)
CO2: 29 mEq/L (ref 19–32)
Calcium: 10.2 mg/dL (ref 8.4–10.5)
Chloride: 106 mEq/L (ref 96–112)
Creatinine, Ser: 1.78 mg/dL — ABNORMAL HIGH (ref 0.40–1.20)
GFR: 26.62 mL/min — ABNORMAL LOW (ref >60.00)
Glucose, Bld: 160 mg/dL — ABNORMAL HIGH (ref 70–99)
Potassium: 4.8 mEq/L (ref 3.5–5.1)
Sodium: 141 mEq/L (ref 135–145)
Total Bilirubin: 1.1 mg/dL (ref 0.2–1.2)
Total Protein: 6.7 g/dL (ref 6.0–8.3)

## 2020-08-12 LAB — HEMOGLOBIN A1C: Hgb A1c MFr Bld: 7.4 % — ABNORMAL HIGH (ref 4.6–6.5)

## 2020-08-12 LAB — TSH: TSH: 1.4 u[IU]/mL (ref 0.35–4.50)

## 2020-08-12 MED ORDER — TRIAMCINOLONE ACETONIDE 0.1 % EX CREA: 1.0000 "application " | TOPICAL_CREAM | Freq: Two times a day (BID) | CUTANEOUS | 1 refills | Status: DC

## 2020-08-12 NOTE — Assessment & Plan Note (Signed)
Tylenol prn Pt declined shoulder inj

## 2020-08-12 NOTE — Progress Notes (Signed)
Subjective:  Patient ID: Brandy Gonzales, female    DOB: [birth year]  Age: 80 y.o. MRN: [id number]  CC: Follow-up (4 month f/u)   HPI Brandy Gonzales presents for OA, HTN, DM f/u C/o B shoulders hurt a lot Not taking Pravastatin  Outpatient Medications Prior to Visit  Medication Sig Dispense Refill  . acetaminophen (TYLENOL) 500 MG tablet Take 500 mg by mouth every 6 (six) hours as needed.    Marland Kitchen aspirin 81 MG EC tablet Take 81 mg by mouth daily.    . carvedilol (COREG) 25 MG tablet TAKE 1 TABLET BY MOUTH TWICE DAILY WITH MEALS 180 tablet 1  . Cholecalciferol (VITAMIN D3) 50 MCG (2000 UT) capsule Take 1 capsule (2,000 Units total) by mouth daily. 100 capsule 3  . glimepiride (AMARYL) 2 MG tablet TAKE 1 TABLET BY MOUTH ONCE DAILY BEFORE BREAKFAST 90 tablet 1  . omeprazole (PRILOSEC) 20 MG capsule TAKE 1 CAPSULE BY MOUTH  DAILY 90 capsule 1  . spironolactone (ALDACTONE) 50 MG tablet TAKE 1 TABLET BY MOUTH ONCE DAILY FOR HIGH BLOOD PRESSURE 90 tablet 1  . pravastatin (PRAVACHOL) 20 MG tablet Take 1 tablet (20 mg total) by mouth daily. (Patient not taking: Reported on 08/12/2020) 30 tablet 11  . cetirizine (ZYRTEC) 5 MG tablet Take 1 tablet (5 mg total) by mouth daily. (Patient not taking: Reported on 08/12/2020) 10 tablet 0   No facility-administered medications prior to visit.    ROS: Review of Systems  Constitutional: Positive for unexpected weight change. Negative for activity change, appetite change, chills and fatigue.  HENT: Negative for congestion, mouth sores and sinus pressure.   Eyes: Negative for visual disturbance.  Respiratory: Negative for cough and chest tightness.   Gastrointestinal: Negative for abdominal pain and nausea.  Genitourinary: Negative for difficulty urinating, frequency and vaginal pain.  Musculoskeletal: Positive for arthralgias, back pain and gait problem.  Skin: Negative for pallor and rash.  Neurological: Negative for dizziness, tremors, weakness, numbness and  headaches.  Hematological: Does not bruise/bleed easily.  Psychiatric/Behavioral: Negative for confusion, decreased concentration and sleep disturbance.    Objective:  BP 132/76 (BP Location: Left Arm)   Pulse 67   Temp 98.4 F (36.9 C) (Oral)   Ht '4\' 11"'$  (1.499 m)   Wt 140 lb 3.2 oz (63.6 kg)   SpO2 97%   BMI 28.32 kg/m   BP Readings from Last 3 Encounters:  08/12/20 132/76  04/11/20 132/84  12/06/19 (!) 120/90    Wt Readings from Last 3 Encounters:  08/12/20 140 lb 3.2 oz (63.6 kg)  04/11/20 146 lb 3.2 oz (66.3 kg)  12/06/19 150 lb 6.4 oz (68.2 kg)    Physical Exam Constitutional:      General: She is not in acute distress.    Appearance: She is well-developed. She is obese.  HENT:     Head: Normocephalic.     Right Ear: External ear normal.     Left Ear: External ear normal.     Nose: Nose normal.  Eyes:     General:        Right eye: No discharge.        Left eye: No discharge.     Conjunctiva/sclera: Conjunctivae normal.     Pupils: Pupils are equal, round, and reactive to light.  Neck:     Thyroid: No thyromegaly.     Vascular: No JVD.     Trachea: No tracheal deviation.  Cardiovascular:     Rate  and Rhythm: Normal rate and regular rhythm.     Heart sounds: Normal heart sounds.  Pulmonary:     Effort: No respiratory distress.     Breath sounds: No stridor. No wheezing.  Abdominal:     General: Bowel sounds are normal. There is no distension.     Palpations: Abdomen is soft. There is no mass.     Tenderness: There is no abdominal tenderness. There is no guarding or rebound.  Musculoskeletal:        General: No tenderness.     Cervical back: Normal range of motion and neck supple.  Lymphadenopathy:     Cervical: No cervical adenopathy.  Skin:    Findings: No erythema or rash.  Neurological:     Mental Status: She is oriented to person, place, and time.     Cranial Nerves: No cranial nerve deficit.     Motor: No abnormal muscle tone.      Coordination: Coordination abnormal.     Gait: Gait abnormal.     Deep Tendon Reflexes: Reflexes normal.  Psychiatric:        Behavior: Behavior normal.        Thought Content: Thought content normal.        Judgment: Judgment normal.    using a cane Shoulders - pain w/ROM  Lab Results  Component Value Date   WBC 6.7 12/06/2019   HGB 12.5 12/06/2019   HCT 36.8 12/06/2019   PLT 153.0 12/06/2019   GLUCOSE 250 (H) 04/11/2020   CHOL 156 12/06/2019   TRIG 177.0 (H) 12/06/2019   HDL 38.10 (L) 12/06/2019   LDLDIRECT 78.9 10/22/2008   LDLCALC 83 12/06/2019   ALT 13 04/11/2020   AST 16 04/11/2020   NA 141 04/11/2020   K 4.4 04/11/2020   CL 107 04/11/2020   CREATININE 1.63 (H) 04/11/2020   BUN 18 04/11/2020   CO2 28 04/11/2020   TSH 1.18 04/11/2020   INR 1.03 08/16/2011   HGBA1C 7.3 (H) 04/11/2020   MICROALBUR 0.7 08/08/2015    DG Hip Unilat With Pelvis 2-3 Views Right  Result Date: 05/08/2019 CLINICAL DATA:  Right hip pain for 4 days.  No known injury. EXAM: DG HIP (WITH OR WITHOUT PELVIS) 2-3V RIGHT COMPARISON:  None. FINDINGS: There is no evidence of hip fracture or dislocation. There is no evidence of arthropathy or other focal bone abnormality. IMPRESSION: Negative exam. Electronically Signed   By: Inge Rise M.D.   On: 05/08/2019 13:38    Assessment & Plan:    Walker Kehr, MD

## 2020-08-12 NOTE — Assessment & Plan Note (Signed)
Not taking Pravachol

## 2020-08-12 NOTE — Assessment & Plan Note (Signed)
Loosing wt 

## 2020-08-12 NOTE — Assessment & Plan Note (Signed)
Stage 3

## 2020-08-12 NOTE — Assessment & Plan Note (Signed)
B ears  Triamc cream

## 2020-08-12 NOTE — Assessment & Plan Note (Signed)
On Coreg, Spironolactone CT ca scoring option info given

## 2020-08-12 NOTE — Assessment & Plan Note (Signed)
Check CMET. 

## 2020-08-12 NOTE — Assessment & Plan Note (Addendum)
CT ca scoring option info given A1c

## 2020-10-16 DIAGNOSIS — H35033 Hypertensive retinopathy, bilateral: Secondary | ICD-10-CM | POA: Diagnosis not present

## 2020-10-16 DIAGNOSIS — Z961 Presence of intraocular lens: Secondary | ICD-10-CM | POA: Diagnosis not present

## 2020-10-16 DIAGNOSIS — H35373 Puckering of macula, bilateral: Secondary | ICD-10-CM | POA: Diagnosis not present

## 2020-10-16 DIAGNOSIS — H26493 Other secondary cataract, bilateral: Secondary | ICD-10-CM | POA: Diagnosis not present

## 2020-10-16 DIAGNOSIS — E119 Type 2 diabetes mellitus without complications: Secondary | ICD-10-CM | POA: Diagnosis not present

## 2020-12-12 ENCOUNTER — Other Ambulatory Visit: Payer: Self-pay

## 2020-12-12 ENCOUNTER — Encounter: Payer: Self-pay | Admitting: Internal Medicine

## 2020-12-12 ENCOUNTER — Ambulatory Visit (INDEPENDENT_AMBULATORY_CARE_PROVIDER_SITE_OTHER): Payer: Medicare Other | Admitting: Internal Medicine

## 2020-12-12 VITALS — BP 140/62 | HR 68 | Temp 98.9°F | Ht 59.0 in | Wt 140.4 lb

## 2020-12-12 DIAGNOSIS — E1121 Type 2 diabetes mellitus with diabetic nephropathy: Secondary | ICD-10-CM | POA: Diagnosis not present

## 2020-12-12 DIAGNOSIS — E785 Hyperlipidemia, unspecified: Secondary | ICD-10-CM | POA: Diagnosis not present

## 2020-12-12 DIAGNOSIS — L219 Seborrheic dermatitis, unspecified: Secondary | ICD-10-CM

## 2020-12-12 DIAGNOSIS — N184 Chronic kidney disease, stage 4 (severe): Secondary | ICD-10-CM | POA: Diagnosis not present

## 2020-12-12 DIAGNOSIS — N183 Chronic kidney disease, stage 3 unspecified: Secondary | ICD-10-CM

## 2020-12-12 DIAGNOSIS — R609 Edema, unspecified: Secondary | ICD-10-CM | POA: Diagnosis not present

## 2020-12-12 LAB — HEMOGLOBIN A1C: Hgb A1c MFr Bld: 7.5 % — ABNORMAL HIGH (ref 4.6–6.5)

## 2020-12-12 LAB — COMPREHENSIVE METABOLIC PANEL
ALT: 14 U/L (ref 0–35)
AST: 17 U/L (ref 0–37)
Albumin: 3.8 g/dL (ref 3.5–5.2)
Alkaline Phosphatase: 67 U/L (ref 39–117)
BUN: 23 mg/dL (ref 6–23)
CO2: 27 mEq/L (ref 19–32)
Calcium: 10.1 mg/dL (ref 8.4–10.5)
Chloride: 108 mEq/L (ref 96–112)
Creatinine, Ser: 1.82 mg/dL — ABNORMAL HIGH (ref 0.40–1.20)
GFR: 25.86 mL/min — ABNORMAL LOW (ref >60.00)
Glucose, Bld: 216 mg/dL — ABNORMAL HIGH (ref 70–99)
Potassium: 4.9 mEq/L (ref 3.5–5.1)
Sodium: 141 mEq/L (ref 135–145)
Total Bilirubin: 0.9 mg/dL (ref 0.2–1.2)
Total Protein: 6.4 g/dL (ref 6.0–8.3)

## 2020-12-12 NOTE — Assessment & Plan Note (Signed)
B ears Not using Triamc cream - re-start

## 2020-12-12 NOTE — Assessment & Plan Note (Addendum)
Check GFR regular   We will need to add Farxiga to improve her diabetes control and to improve kidney function.  GFR is 25.86

## 2020-12-12 NOTE — Assessment & Plan Note (Addendum)
Check A1c today Cont w/Glimeperide  We will need to add  Farxiga to improve her diabetes control and to improve kidney function.

## 2020-12-12 NOTE — Assessment & Plan Note (Signed)
Use compression socks - daytime

## 2020-12-12 NOTE — Progress Notes (Signed)
Subjective:  Patient ID: Brandy Gonzales, female    DOB: 01-08-1941  Age: 80 y.o. MRN: [id number]  CC: Follow-up (4 month f/u)   HPI Brandy Gonzales presents for HTN, SD, CRI f/u The patient is here with her granddaughter.  Her granddaughter is upset about our communication.  Her last report was sent to [facility] via [place].  Apparently Brandy Gonzales's daughter did not have a chance to check Brandy Gonzales's MyChart messages.  Outpatient Medications Prior to Visit  Medication Sig Dispense Refill   acetaminophen (TYLENOL) 500 MG tablet Take 500 mg by mouth every 6 (six) hours as needed.     aspirin 81 MG EC tablet Take 81 mg by mouth daily.     carvedilol (COREG) 25 MG tablet TAKE 1 TABLET BY MOUTH TWICE DAILY WITH MEALS 180 tablet 1   Cholecalciferol (VITAMIN D3) 50 MCG (2000 UT) capsule Take 1 capsule (2,000 Units total) by mouth daily. 100 capsule 3   glimepiride (AMARYL) 2 MG tablet TAKE 1 TABLET BY MOUTH ONCE DAILY BEFORE BREAKFAST 90 tablet 1   omeprazole (PRILOSEC) 20 MG capsule TAKE 1 CAPSULE BY MOUTH  DAILY 90 capsule 1   spironolactone (ALDACTONE) 50 MG tablet TAKE 1 TABLET BY MOUTH ONCE DAILY FOR HIGH BLOOD PRESSURE 90 tablet 1   triamcinolone (KENALOG) 0.1 % Apply 1 application topically 2 (two) times daily. 45 g 1   No facility-administered medications prior to visit.    ROS: Review of Systems  Constitutional:  Positive for fatigue. Negative for activity change, appetite change, chills and unexpected weight change.  HENT:  Negative for congestion, mouth sores and sinus pressure.   Eyes:  Negative for visual disturbance.  Respiratory:  Negative for cough and chest tightness.   Gastrointestinal:  Negative for abdominal pain and nausea.  Genitourinary:  Negative for difficulty urinating, frequency and vaginal pain.  Musculoskeletal:  Positive for arthralgias and gait problem. Negative for back pain.  Skin:  Positive for rash. Negative for pallor.  Neurological:  Negative for dizziness, tremors,  weakness, numbness and headaches.  Psychiatric/Behavioral:  Negative for confusion, sleep disturbance and suicidal ideas. The patient is not nervous/anxious.    Objective:  BP 140/62 (BP Location: Left Arm)   Pulse 68   Temp 98.9 F (37.2 C) (Oral)   Ht '4\' 11"'$  (1.499 m)   Wt 140 lb 6.4 oz (63.7 kg)   SpO2 97%   BMI 28.36 kg/m   BP Readings from Last 3 Encounters:  12/12/20 140/62  08/12/20 132/76  04/11/20 132/84    Wt Readings from Last 3 Encounters:  12/12/20 140 lb 6.4 oz (63.7 kg)  08/12/20 140 lb 3.2 oz (63.6 kg)  04/11/20 146 lb 3.2 oz (66.3 kg)    Physical Exam Constitutional:      General: She is not in acute distress.    Appearance: She is well-developed. She is obese.  HENT:     Head: Normocephalic.     Right Ear: External ear normal.     Left Ear: External ear normal.     Nose: Nose normal.  Eyes:     General:        Right eye: No discharge.        Left eye: No discharge.     Conjunctiva/sclera: Conjunctivae normal.     Pupils: Pupils are equal, round, and reactive to light.  Neck:     Thyroid: No thyromegaly.     Vascular: No JVD.     Trachea: No tracheal  deviation.  Cardiovascular:     Rate and Rhythm: Normal rate and regular rhythm.     Heart sounds: Normal heart sounds.  Pulmonary:     Effort: No respiratory distress.     Breath sounds: No stridor. No wheezing.  Abdominal:     General: Bowel sounds are normal. There is no distension.     Palpations: Abdomen is soft. There is no mass.     Tenderness: There is no abdominal tenderness. There is no guarding or rebound.  Musculoskeletal:        General: Tenderness present.     Cervical back: Normal range of motion and neck supple. No rigidity.  Lymphadenopathy:     Cervical: No cervical adenopathy.  Skin:    Findings: No erythema or rash.  Neurological:     Cranial Nerves: No cranial nerve deficit.     Motor: No abnormal muscle tone.     Coordination: Coordination normal.     Gait: Gait  abnormal.     Deep Tendon Reflexes: Reflexes normal.  Psychiatric:        Behavior: Behavior normal.        Thought Content: Thought content normal.        Judgment: Judgment normal.   Using a cane Ataxic B ears w/SD  Lab Results  Component Value Date   WBC 6.7 12/06/2019   HGB 12.5 12/06/2019   HCT 36.8 12/06/2019   PLT 153.0 12/06/2019   GLUCOSE 160 (H) 08/12/2020   CHOL 156 12/06/2019   TRIG 177.0 (H) 12/06/2019   HDL 38.10 (L) 12/06/2019   LDLDIRECT 78.9 10/22/2008   LDLCALC 83 12/06/2019   ALT 13 08/12/2020   AST 16 08/12/2020   NA 141 08/12/2020   K 4.8 08/12/2020   CL 106 08/12/2020   CREATININE 1.78 (H) 08/12/2020   BUN 22 08/12/2020   CO2 29 08/12/2020   TSH 1.40 08/12/2020   INR 1.03 08/16/2011   HGBA1C 7.4 (H) 08/12/2020   MICROALBUR 0.7 08/08/2015    DG Hip Unilat With Pelvis 2-3 Views Right  Result Date: 05/08/2019 CLINICAL DATA:  Right hip pain for 4 days.  No known injury. EXAM: DG HIP (WITH OR WITHOUT PELVIS) 2-3V RIGHT COMPARISON:  None. FINDINGS: There is no evidence of hip fracture or dislocation. There is no evidence of arthropathy or other focal bone abnormality. IMPRESSION: Negative exam. Electronically Signed   By: Inge Rise M.D.   On: 05/08/2019 13:38    Assessment & Plan:     Walker Kehr, MD

## 2020-12-13 ENCOUNTER — Other Ambulatory Visit: Payer: Self-pay | Admitting: Internal Medicine

## 2020-12-13 LAB — URINALYSIS, ROUTINE W REFLEX MICROSCOPIC
Bilirubin Urine: NEGATIVE
Hgb urine dipstick: NEGATIVE
Ketones, ur: NEGATIVE
Nitrite: POSITIVE — AB
Specific Gravity, Urine: 1.015 (ref 1.000–1.030)
Total Protein, Urine: NEGATIVE
Urine Glucose: NEGATIVE
Urobilinogen, UA: 0.2 (ref 0.0–1.0)
pH: 5.5 (ref 5.0–8.0)

## 2020-12-13 LAB — MICROALBUMIN / CREATININE URINE RATIO
Creatinine,U: 79.9 mg/dL
Microalb Creat Ratio: 0.9 mg/g (ref 0.0–30.0)
Microalb, Ur: <0.7 mg/dL (ref 0.0–1.9)

## 2020-12-13 MED ORDER — DAPAGLIFLOZIN PROPANEDIOL 10 MG PO TABS: 10.0000 mg | ORAL_TABLET | Freq: Every day | ORAL | 5 refills | Status: DC

## 2020-12-13 NOTE — Assessment & Plan Note (Signed)
We will need to add Farxiga to improve her diabetes control and to improve kidney function.  GFR is 25.86

## 2020-12-15 ENCOUNTER — Other Ambulatory Visit: Payer: Self-pay | Admitting: Internal Medicine

## 2020-12-15 MED ORDER — CEPHALEXIN 500 MG PO CAPS: 500.0000 mg | ORAL_CAPSULE | Freq: Four times a day (QID) | ORAL | 0 refills | Status: DC

## 2021-03-24 ENCOUNTER — Ambulatory Visit: Payer: Medicare Other

## 2021-03-24 ENCOUNTER — Ambulatory Visit (INDEPENDENT_AMBULATORY_CARE_PROVIDER_SITE_OTHER): Payer: Medicare Other

## 2021-03-24 DIAGNOSIS — Z0000 Encounter for general adult medical examination without abnormal findings: Secondary | ICD-10-CM

## 2021-03-24 NOTE — Patient Instructions (Signed)
Brandy Gonzales , Thank you for taking time to come for your Medicare Wellness Visit. I appreciate your ongoing commitment to your health goals. Please review the following plan we discussed and let me know if I can assist you in the future.   Screening recommendations/referrals: Colonoscopy: no longer required  Mammogram: no longer required  Bone Density: declined referral  Recommended yearly ophthalmology/optometry visit for glaucoma screening and checkup Recommended yearly dental visit for hygiene and checkup  Vaccinations: Influenza vaccine: declines  Pneumococcal vaccine: completed  Tdap vaccine: 11/01/2014 Shingles vaccine: declines     Advanced directives: none   Conditions/risks identified: none   Next appointment: none    Preventive Care 81 Years and Older, Female Preventive care refers to lifestyle choices and visits with your health care provider that can promote health and wellness. What does preventive care include? A yearly physical exam. This is also called an annual well check. Dental exams once or twice a year. Routine eye exams. Ask your health care provider how often you should have your eyes checked. Personal lifestyle choices, including: Daily care of your teeth and gums. Regular physical activity. Eating a healthy diet. Avoiding tobacco and drug use. Limiting alcohol use. Practicing safe sex. Taking low-dose aspirin every day. Taking vitamin and mineral supplements as recommended by your health care provider. What happens during an annual well check? The services and screenings done by your health care provider during your annual well check will depend on your age, overall health, lifestyle risk factors, and family history of disease. Counseling  Your health care provider may ask you questions about your: Alcohol use. Tobacco use. Drug use. Emotional well-being. Home and relationship well-being. Sexual activity. Eating habits. History of falls. Memory  and ability to understand (cognition). Work and work Statistician. Reproductive health. Screening  You may have the following tests or measurements: Height, weight, and BMI. Blood pressure. Lipid and cholesterol levels. These may be checked every 5 years, or more frequently if you are over 37 years old. Skin check. Lung cancer screening. You may have this screening every year starting at age 61 if you have a 30-pack-year history of smoking and currently smoke or have quit within the past 15 years. Fecal occult blood test (FOBT) of the stool. You may have this test every year starting at age 59. Flexible sigmoidoscopy or colonoscopy. You may have a sigmoidoscopy every 5 years or a colonoscopy every 10 years starting at age 25. Hepatitis C blood test. Hepatitis B blood test. Sexually transmitted disease (STD) testing. Diabetes screening. This is done by checking your blood sugar (glucose) after you have not eaten for a while (fasting). You may have this done every 1-3 years. Bone density scan. This is done to screen for osteoporosis. You may have this done starting at age 28. Mammogram. This may be done every 1-2 years. Talk to your health care provider about how often you should have regular mammograms. Talk with your health care provider about your test results, treatment options, and if necessary, the need for more tests. Vaccines  Your health care provider may recommend certain vaccines, such as: Influenza vaccine. This is recommended every year. Tetanus, diphtheria, and acellular pertussis (Tdap, Td) vaccine. You may need a Td booster every 10 years. Zoster vaccine. You may need this after age 17. Pneumococcal 13-valent conjugate (PCV13) vaccine. One dose is recommended after age 73. Pneumococcal polysaccharide (PPSV23) vaccine. One dose is recommended after age 44. Talk to your health care provider about which screenings  and vaccines you need and how often you need them. This  information is not intended to replace advice given to you by your health care provider. Make sure you discuss any questions you have with your health care provider. Document Released: 05/24/2015 Document Revised: 01/15/2016 Document Reviewed: 02/26/2015 Elsevier Interactive Patient Education  2017 [place] Prevention in the Home Falls can cause injuries. They can happen to people of all ages. There are many things you can do to make your home safe and to help prevent falls. What can I do on the outside of my home? Regularly fix the edges of walkways and driveways and fix any cracks. Remove anything that might make you trip as you walk through a door, such as a raised step or threshold. Trim any bushes or trees on the path to your home. Use bright outdoor lighting. Clear any walking paths of anything that might make someone trip, such as rocks or tools. Regularly check to see if handrails are loose or broken. Make sure that both sides of any steps have handrails. Any raised decks and porches should have guardrails on the edges. Have any leaves, snow, or ice cleared regularly. Use sand or salt on walking paths during winter. Clean up any spills in your garage right away. This includes oil or grease spills. What can I do in the bathroom? Use night lights. Install grab bars by the toilet and in the tub and shower. Do not use towel bars as grab bars. Use non-skid mats or decals in the tub or shower. If you need to sit down in the shower, use a plastic, non-slip stool. Keep the floor dry. Clean up any water that spills on the floor as soon as it happens. Remove soap buildup in the tub or shower regularly. Attach bath mats securely with double-sided non-slip rug tape. Do not have throw rugs and other things on the floor that can make you trip. What can I do in the bedroom? Use night lights. Make sure that you have a light by your bed that is easy to reach. Do not use any sheets or  blankets that are too big for your bed. They should not hang down onto the floor. Have a firm chair that has side arms. You can use this for support while you get dressed. Do not have throw rugs and other things on the floor that can make you trip. What can I do in the kitchen? Clean up any spills right away. Avoid walking on wet floors. Keep items that you use a lot in easy-to-reach places. If you need to reach something above you, use a strong step stool that has a grab bar. Keep electrical cords out of the way. Do not use floor polish or wax that makes floors slippery. If you must use wax, use non-skid floor wax. Do not have throw rugs and other things on the floor that can make you trip. What can I do with my stairs? Do not leave any items on the stairs. Make sure that there are handrails on both sides of the stairs and use them. Fix handrails that are broken or loose. Make sure that handrails are as long as the stairways. Check any carpeting to make sure that it is firmly attached to the stairs. Fix any carpet that is loose or worn. Avoid having throw rugs at the top or bottom of the stairs. If you do have throw rugs, attach them to the floor with carpet tape.  Make sure that you have a light switch at the top of the stairs and the bottom of the stairs. If you do not have them, ask someone to add them for you. What else can I do to help prevent falls? Wear shoes that: Do not have high heels. Have rubber bottoms. Are comfortable and fit you well. Are closed at the toe. Do not wear sandals. If you use a stepladder: Make sure that it is fully opened. Do not climb a closed stepladder. Make sure that both sides of the stepladder are locked into place. Ask someone to hold it for you, if possible. Clearly mark and make sure that you can see: Any grab bars or handrails. First and last steps. Where the edge of each step is. Use tools that help you move around (mobility aids) if they are  needed. These include: Canes. Walkers. Scooters. Crutches. Turn on the lights when you go into a dark area. Replace any light bulbs as soon as they burn out. Set up your furniture so you have a clear path. Avoid moving your furniture around. If any of your floors are uneven, fix them. If there are any pets around you, be aware of where they are. Review your medicines with your doctor. Some medicines can make you feel dizzy. This can increase your chance of falling. Ask your doctor what other things that you can do to help prevent falls. This information is not intended to replace advice given to you by your health care provider. Make sure you discuss any questions you have with your health care provider. Document Released: 02/21/2009 Document Revised: 10/03/2015 Document Reviewed: 06/01/2014 Elsevier Interactive Patient Education  2017 [employer].
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
## 2021-04-16 ENCOUNTER — Encounter: Payer: Self-pay | Admitting: Internal Medicine

## 2021-04-16 ENCOUNTER — Ambulatory Visit (INDEPENDENT_AMBULATORY_CARE_PROVIDER_SITE_OTHER): Payer: Medicare Other | Admitting: Internal Medicine

## 2021-04-16 ENCOUNTER — Other Ambulatory Visit: Payer: Self-pay

## 2021-04-16 DIAGNOSIS — N3281 Overactive bladder: Secondary | ICD-10-CM

## 2021-04-16 DIAGNOSIS — E1121 Type 2 diabetes mellitus with diabetic nephropathy: Secondary | ICD-10-CM | POA: Diagnosis not present

## 2021-04-16 DIAGNOSIS — N184 Chronic kidney disease, stage 4 (severe): Secondary | ICD-10-CM

## 2021-04-16 DIAGNOSIS — M79605 Pain in left leg: Secondary | ICD-10-CM | POA: Diagnosis not present

## 2021-04-16 DIAGNOSIS — M79604 Pain in right leg: Secondary | ICD-10-CM

## 2021-04-16 LAB — URINALYSIS, ROUTINE W REFLEX MICROSCOPIC
Bilirubin Urine: NEGATIVE
Hgb urine dipstick: NEGATIVE
Ketones, ur: NEGATIVE
Nitrite: NEGATIVE
RBC / HPF: NONE SEEN (ref 0–?)
Specific Gravity, Urine: 1.01 (ref 1.000–1.030)
Total Protein, Urine: NEGATIVE
Urine Glucose: 500 — AB
Urobilinogen, UA: 1 (ref 0.0–1.0)
pH: 6 (ref 5.0–8.0)

## 2021-04-16 LAB — COMPREHENSIVE METABOLIC PANEL
ALT: 12 U/L (ref 0–35)
AST: 17 U/L (ref 0–37)
Albumin: 3.9 g/dL (ref 3.5–5.2)
Alkaline Phosphatase: 71 U/L (ref 39–117)
BUN: 24 mg/dL — ABNORMAL HIGH (ref 6–23)
CO2: 27 mEq/L (ref 19–32)
Calcium: 10.2 mg/dL (ref 8.4–10.5)
Chloride: 106 mEq/L (ref 96–112)
Creatinine, Ser: 1.88 mg/dL — ABNORMAL HIGH (ref 0.40–1.20)
GFR: 24.81 mL/min — ABNORMAL LOW (ref >60.00)
Glucose, Bld: 242 mg/dL — ABNORMAL HIGH (ref 70–99)
Potassium: 4.9 mEq/L (ref 3.5–5.1)
Sodium: 139 mEq/L (ref 135–145)
Total Bilirubin: 1 mg/dL (ref 0.2–1.2)
Total Protein: 6.6 g/dL (ref 6.0–8.3)

## 2021-04-16 LAB — HEMOGLOBIN A1C: Hgb A1c MFr Bld: 7.7 % — ABNORMAL HIGH (ref 4.6–6.5)

## 2021-04-16 MED ORDER — DAPAGLIFLOZIN PROPANEDIOL 10 MG PO TABS: 10.0000 mg | ORAL_TABLET | Freq: Every day | ORAL | 3 refills | Status: DC

## 2021-04-16 MED ORDER — SPIRONOLACTONE 50 MG PO TABS: 50.0000 mg | ORAL_TABLET | Freq: Every day | ORAL | 3 refills | Status: DC

## 2021-04-16 MED ORDER — CARVEDILOL 25 MG PO TABS: 25.0000 mg | ORAL_TABLET | Freq: Two times a day (BID) | ORAL | 3 refills | Status: DC

## 2021-04-16 MED ORDER — OMEPRAZOLE 20 MG PO CPDR: 20.0000 mg | DELAYED_RELEASE_CAPSULE | Freq: Every day | ORAL | 3 refills | Status: DC

## 2021-04-16 MED ORDER — GLIMEPIRIDE 2 MG PO TABS: 2.0000 mg | ORAL_TABLET | Freq: Every day | ORAL | 3 refills | Status: DC

## 2021-04-16 MED ORDER — DICLOFENAC SODIUM 1 % EX GEL: 2.0000 g | Freq: Four times a day (QID) | CUTANEOUS | 3 refills | Status: DC

## 2021-04-16 NOTE — Assessment & Plan Note (Signed)
L>>R post-traumatic. Use Voltaren gel

## 2021-04-16 NOTE — Assessment & Plan Note (Signed)
Consider Wilder Glade for renal preservation  8/22 We will need to add Farxiga to improve her diabetes control and to improve kidney function.  GFR is 25.86

## 2021-04-16 NOTE — Progress Notes (Signed)
Subjective:  Patient ID: Brandy Gonzales, female    DOB: [birth year]  Age: 80 y.o. MRN: [id number]  CC: Follow-up (4 month f/u)   HPI Brandy Gonzales presents for HTN, GERD, DM f/u C/o chronic pain in the LLE - no change  Outpatient Medications Prior to Visit  Medication Sig Dispense Refill   acetaminophen (TYLENOL) 500 MG tablet Take 500 mg by mouth every 6 (six) hours as needed.     aspirin 81 MG EC tablet Take 81 mg by mouth daily.     Cholecalciferol (VITAMIN D3) 50 MCG (2000 UT) capsule Take 1 capsule (2,000 Units total) by mouth daily. 100 capsule 3   triamcinolone (KENALOG) 0.1 % Apply 1 application topically 2 (two) times daily. 45 g 1   carvedilol (COREG) 25 MG tablet TAKE 1 TABLET BY MOUTH TWICE DAILY WITH MEALS 180 tablet 1   dapagliflozin propanediol (FARXIGA) 10 MG TABS tablet Take 1 tablet (10 mg total) by mouth daily before breakfast. 30 tablet 5   glimepiride (AMARYL) 2 MG tablet TAKE 1 TABLET BY MOUTH ONCE DAILY BEFORE BREAKFAST 90 tablet 1   omeprazole (PRILOSEC) 20 MG capsule TAKE 1 CAPSULE BY MOUTH  DAILY 90 capsule 1   spironolactone (ALDACTONE) 50 MG tablet TAKE 1 TABLET BY MOUTH ONCE DAILY FOR HIGH BLOOD PRESSURE 90 tablet 1   cephALEXin (KEFLEX) 500 MG capsule Take 1 capsule (500 mg total) by mouth 4 (four) times daily. (Patient not taking: Reported on 03/24/2021) 20 capsule 0   No facility-administered medications prior to visit.    ROS: Review of Systems  Constitutional:  Negative for activity change, appetite change, chills, fatigue and unexpected weight change.  HENT:  Negative for congestion, mouth sores and sinus pressure.   Eyes:  Negative for visual disturbance.  Respiratory:  Negative for cough and chest tightness.   Gastrointestinal:  Negative for abdominal pain and nausea.  Genitourinary:  Negative for difficulty urinating, frequency and vaginal pain.  Musculoskeletal:  Positive for arthralgias and gait problem. Negative for back pain.  Skin:  Negative  for pallor and rash.  Neurological:  Negative for dizziness, tremors, weakness, numbness and headaches.  Psychiatric/Behavioral:  Negative for confusion and sleep disturbance. The patient is not nervous/anxious.    Objective:  BP (!) 142/70 (BP Location: Left Arm)   Pulse 76   Temp 98 F (36.7 C) (Oral)   Ht 4\' 11"  (1.499 m)   Wt 136 lb 9.6 oz (62 kg)   SpO2 98%   BMI 27.59 kg/m   BP Readings from Last 3 Encounters:  04/16/21 (!) 142/70  12/12/20 140/62  08/12/20 132/76    Wt Readings from Last 3 Encounters:  04/16/21 136 lb 9.6 oz (62 kg)  12/12/20 140 lb 6.4 oz (63.7 kg)  08/12/20 140 lb 3.2 oz (63.6 kg)    Physical Exam Constitutional:      General: She is not in acute distress.    Appearance: Normal appearance. She is well-developed.  HENT:     Head: Normocephalic.     Right Ear: External ear normal.     Left Ear: External ear normal.     Nose: Nose normal.  Eyes:     General:        Right eye: No discharge.        Left eye: No discharge.     Conjunctiva/sclera: Conjunctivae normal.     Pupils: Pupils are equal, round, and reactive to light.  Neck:  Thyroid: No thyromegaly.     Vascular: No JVD.     Trachea: No tracheal deviation.  Cardiovascular:     Rate and Rhythm: Normal rate and regular rhythm.     Heart sounds: Normal heart sounds.  Pulmonary:     Effort: No respiratory distress.     Breath sounds: No stridor. No wheezing.  Abdominal:     General: Bowel sounds are normal. There is no distension.     Palpations: Abdomen is soft. There is no mass.     Tenderness: There is no abdominal tenderness. There is no guarding or rebound.  Musculoskeletal:        General: No tenderness.     Cervical back: Normal range of motion and neck supple. No rigidity.  Lymphadenopathy:     Cervical: No cervical adenopathy.  Skin:    Findings: No erythema or rash.  Neurological:     Mental Status: She is oriented to person, place, and time.     Cranial Nerves: No  cranial nerve deficit.     Motor: No abnormal muscle tone.     Coordination: Coordination abnormal.     Gait: Gait abnormal.     Deep Tendon Reflexes: Reflexes normal.  Psychiatric:        Behavior: Behavior normal.        Thought Content: Thought content normal.        Judgment: Judgment normal.   Antalgic gait Limping on the LLE. LLE w/hyperpigm, painful Using a cane  Lab Results  Component Value Date   WBC 6.7 12/06/2019   HGB 12.5 12/06/2019   HCT 36.8 12/06/2019   PLT 153.0 12/06/2019   GLUCOSE 216 (H) 12/12/2020   CHOL 156 12/06/2019   TRIG 177.0 (H) 12/06/2019   HDL 38.10 (L) 12/06/2019   LDLDIRECT 78.9 10/22/2008   LDLCALC 83 12/06/2019   ALT 14 12/12/2020   AST 17 12/12/2020   NA 141 12/12/2020   K 4.9 12/12/2020   CL 108 12/12/2020   CREATININE 1.82 (H) 12/12/2020   BUN 23 12/12/2020   CO2 27 12/12/2020   TSH 1.40 08/12/2020   INR 1.03 08/16/2011   HGBA1C 7.5 (H) 12/12/2020   MICROALBUR <0.7 12/12/2020    DG Hip Unilat With Pelvis 2-3 Views Right  Result Date: 05/08/2019 CLINICAL DATA:  Right hip pain for 4 days.  No known injury. EXAM: DG HIP (WITH OR WITHOUT PELVIS) 2-3V RIGHT COMPARISON:  None. FINDINGS: There is no evidence of hip fracture or dislocation. There is no evidence of arthropathy or other focal bone abnormality. IMPRESSION: Negative exam. Electronically Signed   By: Inge Rise M.D.   On: 05/08/2019 13:38    Assessment & Plan:   Problem List Items Addressed This Visit     CKD (chronic kidney disease) stage 4, GFR 15-29 ml/min (HCC)    Consider Farxiga for renal preservation  8/22 We will need to add Farxiga to improve her diabetes control and to improve kidney function.  GFR is 25.86      Relevant Orders   Comprehensive metabolic panel   Hemoglobin A1c   Urinalysis   Controlled type 2 diabetes mellitus with diabetic nephropathy (HCC)    Cont Glimepiride Start Farxiga Check A1c      Relevant Medications   dapagliflozin  propanediol (FARXIGA) 10 MG TABS tablet   glimepiride (AMARYL) 2 MG tablet   Other Relevant Orders   Comprehensive metabolic panel   Hemoglobin A1c   Urinalysis   Overactive bladder  Check UA, A1c         Meds ordered this encounter  Medications   carvedilol (COREG) 25 MG tablet    Sig: Take 1 tablet (25 mg total) by mouth 2 (two) times daily with a meal.    Dispense:  180 tablet    Refill:  3   dapagliflozin propanediol (FARXIGA) 10 MG TABS tablet    Sig: Take 1 tablet (10 mg total) by mouth daily before breakfast.    Dispense:  90 tablet    Refill:  3   glimepiride (AMARYL) 2 MG tablet    Sig: Take 1 tablet (2 mg total) by mouth daily before breakfast.    Dispense:  90 tablet    Refill:  3   omeprazole (PRILOSEC) 20 MG capsule    Sig: Take 1 capsule (20 mg total) by mouth daily.    Dispense:  90 capsule    Refill:  3   spironolactone (ALDACTONE) 50 MG tablet    Sig: Take 1 tablet (50 mg total) by mouth daily. for high blood pressure    Dispense:  90 tablet    Refill:  3      Follow-up: No follow-ups on file.  Walker Kehr, MD

## 2021-04-16 NOTE — Assessment & Plan Note (Signed)
Check UA, A1c

## 2021-04-16 NOTE — Patient Instructions (Signed)
Use Voltaren gel

## 2021-04-16 NOTE — Assessment & Plan Note (Signed)
Cont Glimepiride Start Farxiga Check A1c

## 2021-07-21 ENCOUNTER — Encounter: Payer: Self-pay | Admitting: Internal Medicine

## 2021-07-21 ENCOUNTER — Other Ambulatory Visit: Payer: Self-pay

## 2021-07-21 ENCOUNTER — Ambulatory Visit (INDEPENDENT_AMBULATORY_CARE_PROVIDER_SITE_OTHER): Payer: Medicare Other | Admitting: Internal Medicine

## 2021-07-21 DIAGNOSIS — N184 Chronic kidney disease, stage 4 (severe): Secondary | ICD-10-CM | POA: Diagnosis not present

## 2021-07-21 DIAGNOSIS — I10 Essential (primary) hypertension: Secondary | ICD-10-CM | POA: Diagnosis not present

## 2021-07-21 DIAGNOSIS — E1121 Type 2 diabetes mellitus with diabetic nephropathy: Secondary | ICD-10-CM

## 2021-07-21 LAB — COMPREHENSIVE METABOLIC PANEL
ALT: 17 U/L (ref 0–35)
AST: 18 U/L (ref 0–37)
Albumin: 3.9 g/dL (ref 3.5–5.2)
Alkaline Phosphatase: 68 U/L (ref 39–117)
BUN: 24 mg/dL — ABNORMAL HIGH (ref 6–23)
CO2: 25 mEq/L (ref 19–32)
Calcium: 10 mg/dL (ref 8.4–10.5)
Chloride: 110 mEq/L (ref 96–112)
Creatinine, Ser: 1.7 mg/dL — ABNORMAL HIGH (ref 0.40–1.20)
GFR: 27.94 mL/min — ABNORMAL LOW (ref >60.00)
Glucose, Bld: 147 mg/dL — ABNORMAL HIGH (ref 70–99)
Potassium: 4.8 mEq/L (ref 3.5–5.1)
Sodium: 142 mEq/L (ref 135–145)
Total Bilirubin: 0.7 mg/dL (ref 0.2–1.2)
Total Protein: 6.3 g/dL (ref 6.0–8.3)

## 2021-07-21 LAB — HEMOGLOBIN A1C: Hgb A1c MFr Bld: 7.1 % — ABNORMAL HIGH (ref 4.6–6.5)

## 2021-07-21 NOTE — Assessment & Plan Note (Addendum)
On Coreg, Spironolactone, Farxiga ?

## 2021-07-21 NOTE — Progress Notes (Signed)
? ?Subjective:  ?Patient ID: Brandy Gonzales, female    DOB: 14-Mar-1941  Age: 81 y.o. MRN: [id number] ? ?CC: No chief complaint on file. ? ? ?HPI ?Brandy Gonzales presents for DM, OA, HTN ?She is here w/her GD Brandy Gonzales ? ?Outpatient Medications Prior to Visit  ?Medication Sig Dispense Refill  ? acetaminophen (TYLENOL) 500 MG tablet Take 500 mg by mouth every 6 (six) hours as needed.    ? aspirin 81 MG EC tablet Take 81 mg by mouth daily.    ? carvedilol (COREG) 25 MG tablet Take 1 tablet (25 mg total) by mouth 2 (two) times daily with a meal. 180 tablet 3  ? Cholecalciferol (VITAMIN D3) 50 MCG (2000 UT) capsule Take 1 capsule (2,000 Units total) by mouth daily. 100 capsule 3  ? dapagliflozin propanediol (FARXIGA) 10 MG TABS tablet Take 1 tablet (10 mg total) by mouth daily before breakfast. 90 tablet 3  ? diclofenac Sodium (VOLTAREN) 1 % GEL Apply 2 g topically 4 (four) times daily. 100 g 3  ? glimepiride (AMARYL) 2 MG tablet Take 1 tablet (2 mg total) by mouth daily before breakfast. 90 tablet 3  ? omeprazole (PRILOSEC) 20 MG capsule Take 1 capsule (20 mg total) by mouth daily. 90 capsule 3  ? spironolactone (ALDACTONE) 50 MG tablet Take 1 tablet (50 mg total) by mouth daily. for high blood pressure 90 tablet 3  ? triamcinolone (KENALOG) 0.1 % Apply 1 application topically 2 (two) times daily. 45 g 1  ? ?No facility-administered medications prior to visit.  ? ? ?ROS: ?Review of Systems  ?Constitutional:  Positive for fatigue. Negative for activity change, appetite change, chills and unexpected weight change.  ?HENT:  Negative for congestion, mouth sores and sinus pressure.   ?Eyes:  Negative for visual disturbance.  ?Respiratory:  Negative for cough and chest tightness.   ?Gastrointestinal:  Negative for abdominal pain and nausea.  ?Genitourinary:  Negative for difficulty urinating, frequency and vaginal pain.  ?Musculoskeletal:  Positive for arthralgias, gait problem and neck stiffness. Negative for back pain.  ?Skin:   Negative for pallor and rash.  ?Neurological:  Negative for dizziness, tremors, weakness, numbness and headaches.  ?Psychiatric/Behavioral:  Negative for confusion and sleep disturbance.   ? ?Objective:  ?BP 130/82 (BP Location: Left Arm, Patient Position: Sitting, Cuff Size: Large)   Pulse 72   Temp 98 ?F (36.7 ?C) (Oral)   Ht 4\' 11"  (1.499 m)   Wt 137 lb (62.1 kg)   SpO2 99%   BMI 27.67 kg/m?  ? ?BP Readings from Last 3 Encounters:  ?07/21/21 130/82  ?04/16/21 (!) 142/70  ?12/12/20 140/62  ? ? ?Wt Readings from Last 3 Encounters:  ?07/21/21 137 lb (62.1 kg)  ?04/16/21 136 lb 9.6 oz (62 kg)  ?12/12/20 140 lb 6.4 oz (63.7 kg)  ? ? ?Physical Exam ?Constitutional:   ?   General: She is not in acute distress. ?   Appearance: She is well-developed. She is obese.  ?HENT:  ?   Head: Normocephalic.  ?   Right Ear: External ear normal.  ?   Left Ear: External ear normal.  ?   Nose: Nose normal.  ?Eyes:  ?   General:     ?   Right eye: No discharge.     ?   Left eye: No discharge.  ?   Conjunctiva/sclera: Conjunctivae normal.  ?   Pupils: Pupils are equal, round, and reactive to light.  ?Neck:  ?  Thyroid: No thyromegaly.  ?   Vascular: No JVD.  ?   Trachea: No tracheal deviation.  ?Cardiovascular:  ?   Rate and Rhythm: Normal rate and regular rhythm.  ?   Heart sounds: Normal heart sounds.  ?Pulmonary:  ?   Effort: No respiratory distress.  ?   Breath sounds: No stridor. No wheezing.  ?Abdominal:  ?   General: Bowel sounds are normal. There is no distension.  ?   Palpations: Abdomen is soft. There is no mass.  ?   Tenderness: There is no abdominal tenderness. There is no guarding or rebound.  ?Musculoskeletal:     ?   General: No tenderness.  ?   Cervical back: Normal range of motion and neck supple. No rigidity.  ?Lymphadenopathy:  ?   Cervical: No cervical adenopathy.  ?Skin: ?   Findings: No erythema or rash.  ?Neurological:  ?   Cranial Nerves: No cranial nerve deficit.  ?   Motor: No abnormal muscle tone.  ?    Coordination: Coordination abnormal.  ?   Gait: Gait abnormal.  ?   Deep Tendon Reflexes: Reflexes normal.  ?Psychiatric:     ?   Behavior: Behavior normal.     ?   Thought Content: Thought content normal.     ?   Judgment: Judgment normal.  ?Stiff joits ?Using a Cane ? ?Lab Results  ?Component Value Date  ? WBC 6.7 12/06/2019  ? HGB 12.5 12/06/2019  ? HCT 36.8 12/06/2019  ? PLT 153.0 12/06/2019  ? GLUCOSE 242 (H) 04/16/2021  ? CHOL 156 12/06/2019  ? TRIG 177.0 (H) 12/06/2019  ? HDL 38.10 (L) 12/06/2019  ? LDLDIRECT 78.9 10/22/2008  ? [place] 83 12/06/2019  ? ALT 12 04/16/2021  ? AST 17 04/16/2021  ? NA 139 04/16/2021  ? K 4.9 04/16/2021  ? CL 106 04/16/2021  ? CREATININE 1.88 (H) 04/16/2021  ? BUN 24 (H) 04/16/2021  ? CO2 27 04/16/2021  ? TSH 1.40 08/12/2020  ? INR 1.03 08/16/2011  ? HGBA1C 7.7 (H) 04/16/2021  ? MICROALBUR <0.7 12/12/2020  ? ? ?DG Hip Unilat With Pelvis 2-3 Views Right ? ?Result Date: 05/08/2019 ?CLINICAL DATA:  Right hip pain for 4 days.  No known injury. EXAM: DG HIP (WITH OR WITHOUT PELVIS) 2-3V RIGHT COMPARISON:  None. FINDINGS: There is no evidence of hip fracture or dislocation. There is no evidence of arthropathy or other focal bone abnormality. IMPRESSION: Negative exam. Electronically Signed   By: Inge Rise M.D.   On: 05/08/2019 13:38  ? ? ?Assessment & Plan:  ? ?Problem List Items Addressed This Visit   ? ? CKD (chronic kidney disease) stage 4, GFR 15-29 ml/min (HCC)  ?  Monitor GFR ?  ?  ? Relevant Orders  ? Comprehensive metabolic panel  ? Hemoglobin A1c  ? Controlled type 2 diabetes mellitus with diabetic nephropathy ([place])  ?  Cont w/Glimeperide, [place] ?  ?  ? Relevant Orders  ? Hemoglobin A1c  ? Essential hypertension  ?  On Coreg, Spironolactone, Farxiga ?  ?  ?  ? ? ?No orders of the defined types were placed in this encounter. ?  ? ? ?Follow-up: Return in about 3 months (around 10/21/2021) for a follow-up visit. ? ?Walker Kehr, MD ?

## 2021-07-21 NOTE — Assessment & Plan Note (Signed)
Monitor GFR 

## 2021-07-21 NOTE — Assessment & Plan Note (Addendum)
Cont w/Glimeperide, [place] Glade ?

## 2021-10-05 ENCOUNTER — Encounter (HOSPITAL_COMMUNITY): Payer: Self-pay | Admitting: Emergency Medicine

## 2021-10-05 ENCOUNTER — Emergency Department (HOSPITAL_COMMUNITY): Payer: Medicare Other

## 2021-10-05 ENCOUNTER — Emergency Department (HOSPITAL_COMMUNITY)
Admission: EM | Admit: 2021-10-05 | Discharge: 2021-10-05 | Disposition: A | Discharge status: Home / Self Care | Payer: Medicare Other | Attending: Emergency Medicine | Admitting: Emergency Medicine

## 2021-10-05 ENCOUNTER — Other Ambulatory Visit: Payer: Self-pay

## 2021-10-05 DIAGNOSIS — Z7982 Long term (current) use of aspirin: Secondary | ICD-10-CM | POA: Insufficient documentation

## 2021-10-05 DIAGNOSIS — N189 Chronic kidney disease, unspecified: Secondary | ICD-10-CM | POA: Insufficient documentation

## 2021-10-05 DIAGNOSIS — Z20822 Contact with and (suspected) exposure to covid-19: Secondary | ICD-10-CM | POA: Insufficient documentation

## 2021-10-05 DIAGNOSIS — R0602 Shortness of breath: Secondary | ICD-10-CM | POA: Diagnosis not present

## 2021-10-05 DIAGNOSIS — I129 Hypertensive chronic kidney disease with stage 1 through stage 4 chronic kidney disease, or unspecified chronic kidney disease: Secondary | ICD-10-CM | POA: Diagnosis not present

## 2021-10-05 DIAGNOSIS — R059 Cough, unspecified: Secondary | ICD-10-CM | POA: Diagnosis not present

## 2021-10-05 DIAGNOSIS — E1122 Type 2 diabetes mellitus with diabetic chronic kidney disease: Secondary | ICD-10-CM | POA: Diagnosis not present

## 2021-10-05 DIAGNOSIS — R058 Other specified cough: Secondary | ICD-10-CM

## 2021-10-05 DIAGNOSIS — Z7984 Long term (current) use of oral hypoglycemic drugs: Secondary | ICD-10-CM | POA: Diagnosis not present

## 2021-10-05 DIAGNOSIS — I10 Essential (primary) hypertension: Secondary | ICD-10-CM | POA: Diagnosis not present

## 2021-10-05 LAB — SARS CORONAVIRUS 2 BY RT PCR: SARS Coronavirus 2 by RT PCR: NEGATIVE

## 2021-10-05 MED ORDER — BENZONATATE 100 MG PO CAPS
200.0000 mg | ORAL_CAPSULE | Freq: Once | ORAL | Status: AC
  Administered 2021-10-05: 200 mg via ORAL
  Filled 2021-10-05: qty 2

## 2021-10-05 MED ORDER — ALBUTEROL SULFATE HFA 108 (90 BASE) MCG/ACT IN AERS
2.0000 | INHALATION_SPRAY | RESPIRATORY_TRACT | Status: DC | PRN
  Administered 2021-10-05: 2 via RESPIRATORY_TRACT
  Filled 2021-10-05: qty 6.7

## 2021-10-05 MED ORDER — BENZONATATE 100 MG PO CAPS: 100.0000 mg | ORAL_CAPSULE | Freq: Three times a day (TID) | ORAL | 0 refills | Status: DC | PRN

## 2021-10-05 NOTE — ED Notes (Signed)
Pt in restroom 

## 2021-10-05 NOTE — Discharge Instructions (Addendum)
Like we discussed, I am prescribing you a cough medication called Tessalon Perles.  You can take this up to 3 times per day as needed for your cough.  Please also continue to use your albuterol inhaler as needed for management of cough and wheezing.  If you develop any new or worsening symptoms whatsoever please come back to the emergency department.

## 2021-10-05 NOTE — ED Triage Notes (Signed)
Pt presents to ED triage for evaluation of cough. States cough has been persistent x1 week and she occasionally wheezes. Denies any chest pain or shortness of breath. States she "just came to get it checked out because it is not going away". Endorses chronic leg swelling.

## 2021-10-05 NOTE — ED Provider Notes (Signed)
[facility] EMERGENCY DEPARTMENT Provider Note   CSN: [id number] Arrival date & time: 10/05/21  1848     History  Chief Complaint  Patient presents with   Cough    Brandy Gonzales is a 81 y.o. female.  HPI  Patient is an 81 year old female with history of diabetes mellitus, hypertension, edema, CKD, hyperlipidemia, who presents to the emergency department due to a cough.  States that she has had a productive cough for the past week.  Denies any chest pain, shortness of breath, fevers, chills, rhinorrhea, sore throat.  She does endorse chronic pedal edema.     Home Medications Prior to Admission medications   Medication Sig Start Date End Date Taking? Authorizing Provider  benzonatate (TESSALON) 100 MG capsule Take 1 capsule (100 mg total) by mouth 3 (three) times daily as needed for cough. 10/05/21  Yes Rayna Sexton, PA-C  acetaminophen (TYLENOL) 500 MG tablet Take 500 mg by mouth every 6 (six) hours as needed.    [provider]  aspirin 81 MG EC tablet Take 81 mg by mouth daily.    [provider]  carvedilol (COREG) 25 MG tablet Take 1 tablet (25 mg total) by mouth 2 (two) times daily with a meal. 04/16/21   [name], Evie Lacks, MD  Cholecalciferol (VITAMIN D3) 50 MCG (2000 UT) capsule Take 1 capsule (2,000 Units total) by mouth daily. 08/02/19   [name], Evie Lacks, MD  dapagliflozin propanediol (FARXIGA) 10 MG TABS tablet Take 1 tablet (10 mg total) by mouth daily before breakfast. 04/16/21   [name], Evie Lacks, MD  diclofenac Sodium (VOLTAREN) 1 % GEL Apply 2 g topically 4 (four) times daily. 04/16/21   [name], Evie Lacks, MD  glimepiride (AMARYL) 2 MG tablet Take 1 tablet (2 mg total) by mouth daily before breakfast. 04/16/21   [name], Evie Lacks, MD  omeprazole (PRILOSEC) 20 MG capsule Take 1 capsule (20 mg total) by mouth daily. 04/16/21   [name], Evie Lacks, MD  spironolactone (ALDACTONE) 50 MG tablet Take 1 tablet (50 mg total) by  mouth daily. for high blood pressure 04/16/21   [name], Evie Lacks, MD  triamcinolone (KENALOG) 0.1 % Apply 1 application topically 2 (two) times daily. 08/12/20   [name], Evie Lacks, MD      Allergies    Amlodipine besylate, Atenolol, Enalapril maleate, Oxycodone hcl, and Propoxyphene n-acetaminophen    Review of Systems   Review of Systems  All other systems reviewed and are negative. Ten systems reviewed and are negative for acute change, except as noted in the HPI.   Physical Exam Updated Vital Signs BP (!) 149/69   Pulse 66   Temp 97.7 F (36.5 C) (Oral)   Resp 16   SpO2 99%  Physical Exam Vitals and nursing note reviewed.  Constitutional:      General: She is not in acute distress.    Appearance: Normal appearance. She is not ill-appearing, toxic-appearing or diaphoretic.  HENT:     Head: Normocephalic and atraumatic.     Right Ear: External ear normal.     Left Ear: External ear normal.     Nose: Nose normal.     Mouth/Throat:     Mouth: Mucous membranes are moist.     Pharynx: Oropharynx is clear. No oropharyngeal exudate or posterior oropharyngeal erythema.  Eyes:     General:        Right eye: No discharge.        Left eye: No discharge.  Extraocular Movements: Extraocular movements intact.     Conjunctiva/sclera: Conjunctivae normal.  Cardiovascular:     Rate and Rhythm: Normal rate and regular rhythm.     Pulses: Normal pulses.     Heart sounds: Normal heart sounds. No murmur heard.   No friction rub. No gallop.     Comments: RRR without M/R/G. Pulmonary:     Effort: Pulmonary effort is normal. No respiratory distress.     Breath sounds: No stridor. Wheezing present. No rhonchi or rales.     Comments: Faint expiratory wheezes noted in the bilateral lung bases.  Patient speaking in clear and coherent sentences.  Oxygen saturations at 100% on room air. Abdominal:     General: Abdomen is flat.     Palpations: Abdomen is soft.     Tenderness: There is no  abdominal tenderness.     Comments: Abdomen is soft and nontender.  Musculoskeletal:        General: Normal range of motion.     Cervical back: Normal range of motion and neck supple. No tenderness.     Right lower leg: Edema present.     Left lower leg: Edema present.     Comments: Trace pitting edema noted in the bilateral lower extremities.  Skin:    General: Skin is warm and dry.  Neurological:     General: No focal deficit present.     Mental Status: She is alert and oriented to person, place, and time.  Psychiatric:        Mood and Affect: Mood normal.        Behavior: Behavior normal.   ED Results / Procedures / Treatments   Labs (all labs ordered are listed, but only abnormal results are displayed) Labs Reviewed  SARS CORONAVIRUS 2 BY RT PCR   EKG EKG Interpretation  Date/Time:  Sunday Oct 05 2021 19:05:15 EDT Ventricular Rate:  76 PR Interval:  136 QRS Duration: 72 QT Interval:  382 QTC Calculation: 429 R Axis:   19 Text Interpretation: Normal sinus rhythm ST/T changes unchanged when compared to 2016 Confirmed by Sherwood Gambler [phone]) on 10/05/2021 9:36:08 PM  Radiology DG Chest 2 View  Result Date: 10/05/2021 CLINICAL DATA:  Shortness of breath. EXAM: CHEST - 2 VIEW COMPARISON:  Chest radiograph 04/20/2015. FINDINGS: Chronic hyperinflation. Chronic mild streaky bibasilar opacities. No confluent consolidation. No visible pleural effusions or pneumothorax. Thoracic spine degenerative change with exaggerated thoracic kyphosis, chronic. IVC filter. IMPRESSION: 1. Chronic hyperinflation, suggestive of COPD. 2. Chronic mild streaky bibasilar opacities, compatible with atelectasis. No confluent consolidation. Electronically Signed   By: Margaretha Sheffield M.D.   On: 10/05/2021 19:37    Procedures Procedures   Medications Ordered in ED Medications  albuterol (VENTOLIN HFA) 108 (90 Base) MCG/ACT inhaler 2 puff (2 puffs Inhalation Given 10/05/21 2107)  benzonatate  (TESSALON) capsule 200 mg (200 mg Oral Given 10/05/21 2107)   ED Course/ Medical Decision Making/ A&P                           Medical Decision Making Amount and/or Complexity of Data Reviewed Radiology: ordered.  Risk Prescription drug management.   Patient is an 81 year old female who presents to the emergency department due to a cough for the past week.  Her niece is at bedside.  On my exam patient has faint expiratory wheezes noted in the bilateral lung bases.  Oxygen saturations at 100% on room air.  No tachypnea.  Does not appear to be in respiratory distress at this time.  Heart is regular rate and rhythm without murmurs, rubs, or gallops.  Abdomen is soft and nontender.  Trace pedal edema noted in the bilateral lower extremities.  Besides cough, patient is denying any somatic complaints to me at this time.  She is afebrile, nontachycardic, and nontoxic-appearing.  Chest x-ray is obtained in triage which shows chronic hyperinflation, suggestive of COPD.  Chronic mild streaky bibasilar opacities, compatible with atelectasis.  No confluent consolidation.  COVID test is negative.  Patient denies a history of smoking.  Symptoms appear consistent with possible bronchitis versus viral URI with cough.  Patient denies having an inhaler at home.  Given her wheezing we will treat with an albuterol inhaler.  We will also treat her cough with Tessalon Perles.  We will give her a prescription for Tessalon Perles that she can use as needed until her cough improves.    Patient appears stable for discharge at this time and she and her niece are agreeable.  We discussed return precautions.  Their questions were answered and they were amicable at the time of discharge.  Final Clinical Impression(s) / ED Diagnoses Final diagnoses:  Productive cough   Rx / DC Orders ED Discharge Orders          Ordered    benzonatate (TESSALON) 100 MG capsule  3 times daily PRN        10/05/21 2136               Rayna Sexton, PA-C 10/05/21 2146    Sherwood Gambler, MD 10/07/21 [phone]

## 2021-10-21 ENCOUNTER — Encounter: Payer: Self-pay | Admitting: Internal Medicine

## 2021-10-21 ENCOUNTER — Ambulatory Visit (INDEPENDENT_AMBULATORY_CARE_PROVIDER_SITE_OTHER): Payer: Medicare Other | Admitting: Internal Medicine

## 2021-10-21 VITALS — BP 112/78 | HR 66 | Temp 98.6°F | Ht 59.0 in | Wt 134.0 lb

## 2021-10-21 DIAGNOSIS — J209 Acute bronchitis, unspecified: Secondary | ICD-10-CM | POA: Diagnosis not present

## 2021-10-21 DIAGNOSIS — E785 Hyperlipidemia, unspecified: Secondary | ICD-10-CM | POA: Diagnosis not present

## 2021-10-21 DIAGNOSIS — N3281 Overactive bladder: Secondary | ICD-10-CM | POA: Diagnosis not present

## 2021-10-21 DIAGNOSIS — E1121 Type 2 diabetes mellitus with diabetic nephropathy: Secondary | ICD-10-CM

## 2021-10-21 DIAGNOSIS — I10 Essential (primary) hypertension: Secondary | ICD-10-CM | POA: Diagnosis not present

## 2021-10-21 DIAGNOSIS — N184 Chronic kidney disease, stage 4 (severe): Secondary | ICD-10-CM

## 2021-10-21 HISTORY — DX: Acute bronchitis, unspecified: J20.9

## 2021-10-21 LAB — COMPREHENSIVE METABOLIC PANEL
ALT: 16 U/L (ref 0–35)
AST: 16 U/L (ref 0–37)
Albumin: 3.7 g/dL (ref 3.5–5.2)
Alkaline Phosphatase: 93 U/L (ref 39–117)
BUN: 21 mg/dL (ref 6–23)
CO2: 28 mEq/L (ref 19–32)
Calcium: 10.1 mg/dL (ref 8.4–10.5)
Chloride: 107 mEq/L (ref 96–112)
Creatinine, Ser: 1.62 mg/dL — ABNORMAL HIGH (ref 0.40–1.20)
GFR: 29.55 mL/min — ABNORMAL LOW (ref >60.00)
Glucose, Bld: 148 mg/dL — ABNORMAL HIGH (ref 70–99)
Potassium: 4.7 mEq/L (ref 3.5–5.1)
Sodium: 142 mEq/L (ref 135–145)
Total Bilirubin: 0.6 mg/dL (ref 0.2–1.2)
Total Protein: 6.4 g/dL (ref 6.0–8.3)

## 2021-10-21 LAB — CBC WITH DIFFERENTIAL/PLATELET
Basophils Absolute: 0 10*3/uL (ref 0.0–0.1)
Basophils Relative: 0.5 % (ref 0.0–3.0)
Eosinophils Absolute: 0.2 10*3/uL (ref 0.0–0.7)
Eosinophils Relative: 2.6 % (ref 0.0–5.0)
HCT: 33.7 % — ABNORMAL LOW (ref 36.0–46.0)
Hemoglobin: 11.1 g/dL — ABNORMAL LOW (ref 12.0–15.0)
Lymphocytes Relative: 30.5 % (ref 12.0–46.0)
Lymphs Abs: 2.3 10*3/uL (ref 0.7–4.0)
MCHC: 33.1 g/dL (ref 30.0–36.0)
MCV: 90.8 fl (ref 78.0–100.0)
Monocytes Absolute: 0.6 10*3/uL (ref 0.1–1.0)
Monocytes Relative: 7.7 % (ref 3.0–12.0)
Neutro Abs: 4.4 10*3/uL (ref 1.4–7.7)
Neutrophils Relative %: 58.7 % (ref 43.0–77.0)
Platelets: 196 10*3/uL (ref 150.0–400.0)
RBC: 3.71 Mil/uL — ABNORMAL LOW (ref 3.87–5.11)
RDW: 13.4 % (ref 11.5–15.5)
WBC: 7.6 10*3/uL (ref 4.0–10.5)

## 2021-10-21 LAB — LIPID PANEL
Cholesterol: 140 mg/dL (ref 0–200)
HDL: 37 mg/dL — ABNORMAL LOW (ref >39.00)
LDL Cholesterol: 72 mg/dL (ref 0–99)
NonHDL: 103.08
Total CHOL/HDL Ratio: 4
Triglycerides: 157 mg/dL — ABNORMAL HIGH (ref 0.0–149.0)
VLDL: 31.4 mg/dL (ref 0.0–40.0)

## 2021-10-21 LAB — HEMOGLOBIN A1C: Hgb A1c MFr Bld: 7.7 % — ABNORMAL HIGH (ref 4.6–6.5)

## 2021-10-21 LAB — TSH: TSH: 2.02 u[IU]/mL (ref 0.35–5.50)

## 2021-10-21 MED ORDER — MIRABEGRON ER 50 MG PO TB24: 50.0000 mg | ORAL_TABLET | Freq: Every day | ORAL | 3 refills | Status: DC

## 2021-10-21 MED ORDER — DAPAGLIFLOZIN PROPANEDIOL 10 MG PO TABS: 10.0000 mg | ORAL_TABLET | Freq: Every day | ORAL | 3 refills | Status: DC

## 2021-10-21 MED ORDER — AZITHROMYCIN 250 MG PO TABS: ORAL_TABLET | ORAL | 0 refills | Status: DC

## 2021-10-21 MED ORDER — BENZONATATE 100 MG PO CAPS: 100.0000 mg | ORAL_CAPSULE | Freq: Three times a day (TID) | ORAL | 0 refills | Status: DC | PRN

## 2021-10-21 NOTE — Assessment & Plan Note (Signed)
Worse. Start Myrbetriq

## 2021-10-21 NOTE — Assessment & Plan Note (Addendum)
New  Z pac, Tessalon

## 2021-10-21 NOTE — Progress Notes (Signed)
Subjective:  Patient ID: Brandy Gonzales, female    DOB: 07-Oct-1940  Age: 81 y.o. MRN: [id number]  CC: No chief complaint on file.   HPI Brandy Gonzales presents for HTN, DM, OA F/u on cough - some better after UC visit on 5/28 - on Tessalon C/o OAB sx's  Outpatient Medications Prior to Visit  Medication Sig Dispense Refill   acetaminophen (TYLENOL) 500 MG tablet Take 500 mg by mouth every 6 (six) hours as needed.     aspirin 81 MG EC tablet Take 81 mg by mouth daily.     carvedilol (COREG) 25 MG tablet Take 1 tablet (25 mg total) by mouth 2 (two) times daily with a meal. 180 tablet 3   Cholecalciferol (VITAMIN D3) 50 MCG (2000 UT) capsule Take 1 capsule (2,000 Units total) by mouth daily. 100 capsule 3   diclofenac Sodium (VOLTAREN) 1 % GEL Apply 2 g topically 4 (four) times daily. 100 g 3   glimepiride (AMARYL) 2 MG tablet Take 1 tablet (2 mg total) by mouth daily before breakfast. 90 tablet 3   omeprazole (PRILOSEC) 20 MG capsule Take 1 capsule (20 mg total) by mouth daily. 90 capsule 3   spironolactone (ALDACTONE) 50 MG tablet Take 1 tablet (50 mg total) by mouth daily. for high blood pressure 90 tablet 3   benzonatate (TESSALON) 100 MG capsule Take 1 capsule (100 mg total) by mouth 3 (three) times daily as needed for cough. 21 capsule 0   dapagliflozin propanediol (FARXIGA) 10 MG TABS tablet Take 1 tablet (10 mg total) by mouth daily before breakfast. 90 tablet 3   triamcinolone (KENALOG) 0.1 % Apply 1 application topically 2 (two) times daily. 45 g 1   No facility-administered medications prior to visit.    ROS: Review of Systems  Constitutional:  Negative for activity change, appetite change, chills, fatigue and unexpected weight change.  HENT:  Negative for congestion, mouth sores and sinus pressure.   Eyes:  Negative for visual disturbance.  Respiratory:  Positive for cough. Negative for chest tightness.   Gastrointestinal:  Negative for abdominal pain and nausea.   Genitourinary:  Positive for frequency and urgency. Negative for difficulty urinating and vaginal pain.  Musculoskeletal:  Positive for arthralgias and gait problem. Negative for back pain.  Skin:  Negative for pallor and rash.  Neurological:  Negative for dizziness, tremors, weakness, numbness and headaches.  Psychiatric/Behavioral:  Negative for confusion and sleep disturbance. The patient is not nervous/anxious.     Objective:  BP 112/78 (BP Location: Left Arm, Patient Position: Sitting, Cuff Size: Large)   Pulse 66   Temp 98.6 F (37 C) (Oral)   Ht 4\' 11"  (1.499 m)   Wt 134 lb (60.8 kg)   SpO2 99%   BMI 27.06 kg/m   BP Readings from Last 3 Encounters:  10/21/21 112/78  10/05/21 (!) 149/69  07/21/21 130/82    Wt Readings from Last 3 Encounters:  10/21/21 134 lb (60.8 kg)  07/21/21 137 lb (62.1 kg)  04/16/21 136 lb 9.6 oz (62 kg)    Physical Exam Constitutional:      General: She is not in acute distress.    Appearance: She is well-developed. She is obese.  HENT:     Head: Normocephalic.     Right Ear: External ear normal.     Left Ear: External ear normal.     Nose: Nose normal.  Eyes:     General:  Right eye: No discharge.        Left eye: No discharge.     Conjunctiva/sclera: Conjunctivae normal.     Pupils: Pupils are equal, round, and reactive to light.  Neck:     Thyroid: No thyromegaly.     Vascular: No JVD.     Trachea: No tracheal deviation.  Cardiovascular:     Rate and Rhythm: Normal rate and regular rhythm.     Heart sounds: Normal heart sounds.  Pulmonary:     Effort: No respiratory distress.     Breath sounds: No stridor. Rhonchi present. No wheezing.  Abdominal:     General: Bowel sounds are normal. There is no distension.     Palpations: Abdomen is soft. There is no mass.     Tenderness: There is no abdominal tenderness. There is no guarding or rebound.  Musculoskeletal:        General: No tenderness.     Cervical back: Normal  range of motion and neck supple. No rigidity.  Lymphadenopathy:     Cervical: No cervical adenopathy.  Skin:    Findings: No erythema or rash.  Neurological:     Cranial Nerves: No cranial nerve deficit.     Motor: No abnormal muscle tone.     Coordination: Coordination normal.     Deep Tendon Reflexes: Reflexes normal.  Psychiatric:        Behavior: Behavior normal.        Thought Content: Thought content normal.        Judgment: Judgment normal.     Lab Results  Component Value Date   WBC 6.7 12/06/2019   HGB 12.5 12/06/2019   HCT 36.8 12/06/2019   PLT 153.0 12/06/2019   GLUCOSE 147 (H) 07/21/2021   CHOL 156 12/06/2019   TRIG 177.0 (H) 12/06/2019   HDL 38.10 (L) 12/06/2019   LDLDIRECT 78.9 10/22/2008   LDLCALC 83 12/06/2019   ALT 17 07/21/2021   AST 18 07/21/2021   NA 142 07/21/2021   K 4.8 07/21/2021   CL 110 07/21/2021   CREATININE 1.70 (H) 07/21/2021   BUN 24 (H) 07/21/2021   CO2 25 07/21/2021   TSH 1.40 08/12/2020   INR 1.03 08/16/2011   HGBA1C 7.1 (H) 07/21/2021   MICROALBUR <0.7 12/12/2020    DG Chest 2 View  Result Date: 10/05/2021 CLINICAL DATA:  Shortness of breath. EXAM: CHEST - 2 VIEW COMPARISON:  Chest radiograph 04/20/2015. FINDINGS: Chronic hyperinflation. Chronic mild streaky bibasilar opacities. No confluent consolidation. No visible pleural effusions or pneumothorax. Thoracic spine degenerative change with exaggerated thoracic kyphosis, chronic. IVC filter. IMPRESSION: 1. Chronic hyperinflation, suggestive of COPD. 2. Chronic mild streaky bibasilar opacities, compatible with atelectasis. No confluent consolidation. Electronically Signed   By: Margaretha Sheffield M.D.   On: 10/05/2021 19:37    Assessment & Plan:   Problem List Items Addressed This Visit     Acute bronchitis    New  Z pac, Tessalon      CKD (chronic kidney disease) stage 4, GFR 15-29 ml/min (HCC)    Monitor GFR      Controlled type 2 diabetes mellitus with diabetic  nephropathy (HCC) - [place]    Cont on Farxiga and Glimepiride  Check A1c      Relevant Medications   dapagliflozin propanediol (FARXIGA) 10 MG TABS tablet   Other Relevant Orders   CBC with Differential/Platelet   Hemoglobin A1c   Dyslipidemia   Relevant Orders   TSH   Lipid panel  Essential hypertension   Relevant Orders   TSH   Urinalysis   CBC with Differential/Platelet   Lipid panel   Comprehensive metabolic panel   Hemoglobin A1c   OAB (overactive bladder)    Worse. Start Myrbetriq         Meds ordered this encounter  Medications   benzonatate (TESSALON) 100 MG capsule    Sig: Take 1 capsule (100 mg total) by mouth 3 (three) times daily as needed for cough.    Dispense:  30 capsule    Refill:  0   mirabegron ER (MYRBETRIQ) 50 MG TB24 tablet    Sig: Take 1 tablet (50 mg total) by mouth daily.    Dispense:  90 tablet    Refill:  3   dapagliflozin propanediol (FARXIGA) 10 MG TABS tablet    Sig: Take 1 tablet (10 mg total) by mouth daily before breakfast.    Dispense:  90 tablet    Refill:  3   azithromycin (ZITHROMAX Z-PAK) 250 MG tablet    Sig: As directed    Dispense:  6 tablet    Refill:  0      Follow-up: Return in about 3 months (around 01/21/2022) for a follow-up visit.  Walker Kehr, MD

## 2021-10-21 NOTE — Assessment & Plan Note (Signed)
Cont on Farxiga and Glimepiride  Check A1c

## 2021-10-21 NOTE — Assessment & Plan Note (Signed)
Monitor GFR 

## 2021-10-22 LAB — URINALYSIS, ROUTINE W REFLEX MICROSCOPIC
Bilirubin Urine: NEGATIVE
Hgb urine dipstick: NEGATIVE
Ketones, ur: NEGATIVE
Nitrite: NEGATIVE
RBC / HPF: NONE SEEN (ref 0–?)
Specific Gravity, Urine: 1.01 (ref 1.000–1.030)
Total Protein, Urine: NEGATIVE
Urine Glucose: NEGATIVE
Urobilinogen, UA: 0.2 (ref 0.0–1.0)
pH: 6 (ref 5.0–8.0)

## 2021-10-28 ENCOUNTER — Other Ambulatory Visit: Payer: Self-pay | Admitting: Internal Medicine

## 2021-10-28 MED ORDER — NITROFURANTOIN MONOHYD MACRO 100 MG PO CAPS: 100.0000 mg | ORAL_CAPSULE | Freq: Two times a day (BID) | ORAL | 0 refills | Status: DC

## 2021-10-29 DIAGNOSIS — E119 Type 2 diabetes mellitus without complications: Secondary | ICD-10-CM | POA: Diagnosis not present

## 2021-10-29 DIAGNOSIS — H35373 Puckering of macula, bilateral: Secondary | ICD-10-CM | POA: Diagnosis not present

## 2021-10-29 DIAGNOSIS — H04123 Dry eye syndrome of bilateral lacrimal glands: Secondary | ICD-10-CM | POA: Diagnosis not present

## 2021-10-29 DIAGNOSIS — H26493 Other secondary cataract, bilateral: Secondary | ICD-10-CM | POA: Diagnosis not present

## 2021-11-04 DIAGNOSIS — H1131 Conjunctival hemorrhage, right eye: Secondary | ICD-10-CM | POA: Diagnosis not present

## 2021-11-04 DIAGNOSIS — E119 Type 2 diabetes mellitus without complications: Secondary | ICD-10-CM | POA: Diagnosis not present

## 2021-11-16 ENCOUNTER — Emergency Department (HOSPITAL_COMMUNITY): Payer: Medicare Other

## 2021-11-16 ENCOUNTER — Encounter (HOSPITAL_COMMUNITY): Payer: Self-pay | Admitting: *Deleted

## 2021-11-16 ENCOUNTER — Emergency Department (HOSPITAL_COMMUNITY)
Admission: EM | Admit: 2021-11-16 | Discharge: 2021-11-16 | Disposition: A | Discharge status: Home / Self Care | Payer: Medicare Other | Attending: Emergency Medicine | Admitting: Emergency Medicine

## 2021-11-16 ENCOUNTER — Ambulatory Visit (HOSPITAL_COMMUNITY)
Admission: EM | Admit: 2021-11-16 | Discharge: 2021-11-16 | Disposition: A | Discharge status: Home / Self Care | Payer: Medicare Other | Attending: Internal Medicine | Admitting: Internal Medicine

## 2021-11-16 ENCOUNTER — Other Ambulatory Visit: Payer: Self-pay

## 2021-11-16 DIAGNOSIS — Z79899 Other long term (current) drug therapy: Secondary | ICD-10-CM | POA: Insufficient documentation

## 2021-11-16 DIAGNOSIS — G510 Bell's palsy: Secondary | ICD-10-CM | POA: Insufficient documentation

## 2021-11-16 DIAGNOSIS — R471 Dysarthria and anarthria: Secondary | ICD-10-CM | POA: Diagnosis not present

## 2021-11-16 DIAGNOSIS — I10 Essential (primary) hypertension: Secondary | ICD-10-CM | POA: Diagnosis not present

## 2021-11-16 DIAGNOSIS — R29810 Facial weakness: Secondary | ICD-10-CM

## 2021-11-16 DIAGNOSIS — R531 Weakness: Secondary | ICD-10-CM | POA: Diagnosis present

## 2021-11-16 DIAGNOSIS — Z7982 Long term (current) use of aspirin: Secondary | ICD-10-CM | POA: Diagnosis not present

## 2021-11-16 DIAGNOSIS — R29818 Other symptoms and signs involving the nervous system: Secondary | ICD-10-CM | POA: Diagnosis not present

## 2021-11-16 DIAGNOSIS — G319 Degenerative disease of nervous system, unspecified: Secondary | ICD-10-CM | POA: Diagnosis not present

## 2021-11-16 LAB — CBC WITH DIFFERENTIAL/PLATELET
Abs Immature Granulocytes: 0.02 10*3/uL (ref 0.00–0.07)
Basophils Absolute: 0 10*3/uL (ref 0.0–0.1)
Basophils Relative: 1 %
Eosinophils Absolute: 0.2 10*3/uL (ref 0.0–0.5)
Eosinophils Relative: 4 %
HCT: 35 % — ABNORMAL LOW (ref 36.0–46.0)
Hemoglobin: 11.5 g/dL — ABNORMAL LOW (ref 12.0–15.0)
Immature Granulocytes: 0 %
Lymphocytes Relative: 29 %
Lymphs Abs: 1.7 10*3/uL (ref 0.7–4.0)
MCH: 30.3 pg (ref 26.0–34.0)
MCHC: 32.9 g/dL (ref 30.0–36.0)
MCV: 92.3 fL (ref 80.0–100.0)
Monocytes Absolute: 0.6 10*3/uL (ref 0.1–1.0)
Monocytes Relative: 10 %
Neutro Abs: 3.2 10*3/uL (ref 1.7–7.7)
Neutrophils Relative %: 56 %
Platelets: 157 10*3/uL (ref 150–400)
RBC: 3.79 MIL/uL — ABNORMAL LOW (ref 3.87–5.11)
RDW: 14.2 % (ref 11.5–15.5)
WBC: 5.7 10*3/uL (ref 4.0–10.5)
nRBC: 0 % (ref 0.0–0.2)

## 2021-11-16 LAB — COMPREHENSIVE METABOLIC PANEL
ALT: 18 U/L (ref 0–44)
AST: 21 U/L (ref 15–41)
Albumin: 3.5 g/dL (ref 3.5–5.0)
Alkaline Phosphatase: 80 U/L (ref 38–126)
Anion gap: 7 (ref 5–15)
BUN: 28 mg/dL — ABNORMAL HIGH (ref 8–23)
CO2: 21 mmol/L — ABNORMAL LOW (ref 22–32)
Calcium: 9.8 mg/dL (ref 8.9–10.3)
Chloride: 113 mmol/L — ABNORMAL HIGH (ref 98–111)
Creatinine, Ser: 2.13 mg/dL — ABNORMAL HIGH (ref 0.44–1.00)
GFR, Estimated: 23 mL/min — ABNORMAL LOW (ref >60)
Glucose, Bld: 197 mg/dL — ABNORMAL HIGH (ref 70–99)
Potassium: 4.6 mmol/L (ref 3.5–5.1)
Sodium: 141 mmol/L (ref 135–145)
Total Bilirubin: 1.3 mg/dL — ABNORMAL HIGH (ref 0.3–1.2)
Total Protein: 6 g/dL — ABNORMAL LOW (ref 6.5–8.1)

## 2021-11-16 LAB — URINALYSIS, ROUTINE W REFLEX MICROSCOPIC
Bilirubin Urine: NEGATIVE
Glucose, UA: >=500 mg/dL — AB
Hgb urine dipstick: NEGATIVE
Ketones, ur: NEGATIVE mg/dL
Nitrite: NEGATIVE
Protein, ur: NEGATIVE mg/dL
Specific Gravity, Urine: 1.012 (ref 1.005–1.030)
pH: 5 (ref 5.0–8.0)

## 2021-11-16 LAB — CBG MONITORING, ED
Glucose-Capillary: 175 mg/dL — ABNORMAL HIGH (ref 70–99)
Glucose-Capillary: 211 mg/dL — ABNORMAL HIGH (ref 70–99)

## 2021-11-16 LAB — ETHANOL: Alcohol, Ethyl (B): <10 mg/dL (ref <10)

## 2021-11-16 LAB — TROPONIN I (HIGH SENSITIVITY): Troponin I (High Sensitivity): 7 ng/L (ref <18)

## 2021-11-16 MED ORDER — VALACYCLOVIR HCL 500 MG PO TABS
500.0000 mg | ORAL_TABLET | Freq: Once | ORAL | Status: AC
Start: 2021-11-16 — End: 2021-11-16
  Administered 2021-11-16: 500 mg via ORAL
  Filled 2021-11-16: qty 1

## 2021-11-16 MED ORDER — PREDNISONE 50 MG PO TABS: ORAL_TABLET | ORAL | 0 refills | Status: DC

## 2021-11-16 MED ORDER — ARTIFICIAL TEARS OPHTHALMIC OINT: TOPICAL_OINTMENT | Freq: Every evening | OPHTHALMIC | 0 refills | Status: DC | PRN

## 2021-11-16 MED ORDER — VALACYCLOVIR HCL 1 G PO TABS: 1000.0000 mg | ORAL_TABLET | Freq: Three times a day (TID) | ORAL | 0 refills | Status: DC

## 2021-11-16 MED ORDER — PREDNISONE 20 MG PO TABS
60.0000 mg | ORAL_TABLET | Freq: Once | ORAL | Status: AC
  Administered 2021-11-16: 60 mg via ORAL
  Filled 2021-11-16: qty 3

## 2021-11-16 NOTE — ED Triage Notes (Signed)
Pt presents with Rt sided mouth drooping that started on Friday.

## 2021-11-16 NOTE — ED Notes (Signed)
[facility] transporting Pt to Ed

## 2021-11-16 NOTE — ED Notes (Signed)
Patient is being discharged from the Urgent Care and sent to the Emergency Department via [place] . Per provider, patient is in need of higher level of care due to Stroke symptoms. Patient is aware and verbalizes understanding of plan of care.  Vitals:   11/16/21 1244  BP: (!) 148/77  Pulse: 76  Resp: 18  Temp: 97.8 F (36.6 C)  SpO2: 98%

## 2021-11-16 NOTE — ED Notes (Signed)
Patient was transported from [facility] via [facility]  with c/o code stroke , cancelled at the bridge by neurogist
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
## 2021-11-16 NOTE — ED Notes (Signed)
Pt reports she went to bed at 2200 on 11-13-21 . Pt reports she woke up with Rt sided  mouth drooping but did not tell her family.

## 2021-11-16 NOTE — ED Provider Notes (Signed)
[place] EMERGENCY DEPARTMENT Provider Note   CSN: [id number] Arrival date & time: 11/16/21  1315     History  No chief complaint on file.   Brandy Gonzales is a 81 y.o. female.  Patient transferred from urgent care as code stroke.  She presented there with a 3-day history of weakness to her right face and "twisted mouth".  She first noticed this on the evening of July 7 but did not tell her family about until today.  She denies any headache or visual changes.  She was recently treated for a "burst blood vessel" by her ophthalmologist but this is improving.  Denies any difficulty speaking or difficulty swallowing.  No weakness to her arms or legs.  No chest pain or shortness of breath.  No abdominal pain, nausea or vomiting. No history of previous strokes.  She does have diabetes and high blood pressure.  No blood thinner use  The history is provided by the patient.       Home Medications Prior to Admission medications   Medication Sig Start Date End Date Taking? Authorizing Provider  artificial tears (LACRILUBE) OINT ophthalmic ointment Place into both eyes at bedtime as needed for dry eyes. 11/16/21  Yes Kiva Norland, Annie Main, MD  predniSONE (DELTASONE) 50 MG tablet 1 tablet PO daily 11/16/21  Yes Zylah Elsbernd, Annie Main, MD  valACYclovir (VALTREX) 1000 MG tablet Take 1 tablet (1,000 mg total) by mouth 3 (three) times daily. 11/16/21  Yes Demtrius Rounds, Annie Main, MD  acetaminophen (TYLENOL) 500 MG tablet Take 500 mg by mouth every 6 (six) hours as needed.    [provider]  aspirin 81 MG EC tablet Take 81 mg by mouth daily.    [provider]  azithromycin (ZITHROMAX Z-PAK) 250 MG tablet As directed 10/21/21   [name], Evie Lacks, MD  benzonatate (TESSALON) 100 MG capsule Take 1 capsule (100 mg total) by mouth 3 (three) times daily as needed for cough. 10/21/21   [name], Evie Lacks, MD  carvedilol (COREG) 25 MG tablet Take 1 tablet (25 mg total) by mouth 2 (two) times  daily with a meal. 04/16/21   [name], Evie Lacks, MD  Cholecalciferol (VITAMIN D3) 50 MCG (2000 UT) capsule Take 1 capsule (2,000 Units total) by mouth daily. 08/02/19   [name], Evie Lacks, MD  dapagliflozin propanediol (FARXIGA) 10 MG TABS tablet Take 1 tablet (10 mg total) by mouth daily before breakfast. 10/21/21   [name], Evie Lacks, MD  diclofenac Sodium (VOLTAREN) 1 % GEL Apply 2 g topically 4 (four) times daily. 04/16/21   [name], Evie Lacks, MD  glimepiride (AMARYL) 2 MG tablet Take 1 tablet (2 mg total) by mouth daily before breakfast. 04/16/21   [name], Evie Lacks, MD  mirabegron ER (MYRBETRIQ) 50 MG TB24 tablet Take 1 tablet (50 mg total) by mouth daily. 10/21/21   [name], Evie Lacks, MD  nitrofurantoin, macrocrystal-monohydrate, (MACROBID) 100 MG capsule Take 1 capsule (100 mg total) by mouth 2 (two) times daily. 10/28/21   [name], Evie Lacks, MD  omeprazole (PRILOSEC) 20 MG capsule Take 1 capsule (20 mg total) by mouth daily. 04/16/21   [name], Evie Lacks, MD  spironolactone (ALDACTONE) 50 MG tablet Take 1 tablet (50 mg total) by mouth daily. for high blood pressure 04/16/21   [name], Evie Lacks, MD      Allergies    Amlodipine besylate, Atenolol, Enalapril maleate, Oxycodone hcl, and Propoxyphene n-acetaminophen    Review of Systems   Review of Systems  Constitutional:  Negative  for fever.  Neurological:  Positive for weakness.   all other systems are negative except as noted in the HPI and PMH.    Physical Exam Updated Vital Signs BP 118/64   Pulse 63   Resp 16   SpO2 100%  Physical Exam Vitals and nursing note reviewed.  Constitutional:      General: She is not in acute distress.    Appearance: She is well-developed.  HENT:     Head: Normocephalic and atraumatic.     Mouth/Throat:     Pharynx: No oropharyngeal exudate.  Eyes:     Conjunctiva/sclera: Conjunctivae normal.     Pupils: Pupils are equal, round, and reactive to light.  Neck:     Comments: No  meningismus. Cardiovascular:     Rate and Rhythm: Normal rate and regular rhythm.     Heart sounds: Normal heart sounds. No murmur heard. Pulmonary:     Effort: Pulmonary effort is normal. No respiratory distress.     Breath sounds: Normal breath sounds.  Chest:     Chest wall: No tenderness.  Abdominal:     Palpations: Abdomen is soft.     Tenderness: There is no abdominal tenderness. There is no guarding or rebound.  Musculoskeletal:        General: No tenderness. Normal range of motion.     Cervical back: Normal range of motion and neck supple.  Skin:    General: Skin is warm.  Neurological:     Mental Status: She is alert and oriented to person, place, and time.     Cranial Nerves: Cranial nerve deficit present.     Motor: No abnormal muscle tone.     Coordination: Coordination normal.     Comments: Right-sided facial droop and nasolabial fold flattening.  Tongue is midline.  Difficulty closing right eye.  Weakness raising eyebrow on the right. 5/5 strength in upper and lower extremities.  No pronator drift.  No ataxia finger-nose.  Visual fields full to confrontation bilaterally.  Psychiatric:        Behavior: Behavior normal.     ED Results / Procedures / Treatments   Labs (all labs ordered are listed, but only abnormal results are displayed) Labs Reviewed  URINALYSIS, ROUTINE W REFLEX MICROSCOPIC - Abnormal; Notable for the following components:      Result Value   APPearance HAZY (*)    Glucose, UA >=500 (*)    Leukocytes,Ua MODERATE (*)    Bacteria, UA FEW (*)    All other components within normal limits  CBG MONITORING, ED - Abnormal; Notable for the following components:   Glucose-Capillary 211 (*)    All other components within normal limits  CBC WITH DIFFERENTIAL/PLATELET  COMPREHENSIVE METABOLIC PANEL  ETHANOL  TROPONIN I (HIGH SENSITIVITY)    EKG EKG Interpretation  Date/Time:  Sunday November 16 2021 13:26:19 EDT Ventricular Rate:  72 PR  Interval:  149 QRS Duration: 97 QT Interval:  392 QTC Calculation: 429 R Axis:   8 Text Interpretation: Sinus rhythm LAE, consider biatrial enlargement Repol abnrm suggests ischemia, anterolateral No significant change was found Confirmed by Ezequiel Essex [id number]) on 11/16/2021 3:28:16 PM  Radiology CT Head Wo Contrast  Result Date: 11/16/2021 CLINICAL DATA:  Neuro deficit, acute, stroke suspected EXAM: CT HEAD WITHOUT CONTRAST TECHNIQUE: Contiguous axial images were obtained from the base of the skull through the vertex without intravenous contrast. RADIATION DOSE REDUCTION: This exam was performed according to the departmental dose-optimization program which includes automated exposure  control, adjustment of the mA and/or kV according to patient size and/or use of iterative reconstruction technique. COMPARISON:  CT head March 09, 2009. FINDINGS: Brain: No evidence of acute large vascular territory infarction, hemorrhage, hydrocephalus, extra-axial collection or mass lesion/mass effect. Progressive patchy white matter hypodensities, nonspecific but compatible with chronic microvascular ischemic disease and age-indeterminant infarcts. Remote appearing left frontal infarct. Progressive atrophy. Vascular: No hyperdense vessel. Calcific intracranial atherosclerosis. Skull: No acute fracture. Sinuses/Orbits: Right maxillary sinus and left frontal mucosal thickening. Other: No mastoid effusions IMPRESSION: 1. Multiple age indeterminate white matter infarcts (progressed since 2010) and remote appearing left frontal infarct that is new. An MRI could provide more sensitive evaluation. 2. No acute hemorrhage. 3. Progressive cerebral atrophy (ICD10-G31.9). Electronically Signed   By: Margaretha Sheffield M.D.   On: 11/16/2021 14:50    Procedures Procedures    Medications Ordered in ED Medications  predniSONE (DELTASONE) tablet 60 mg (has no administration in time range)  valACYclovir (VALTREX) tablet 500 mg  (has no administration in time range)    ED Course/ Medical Decision Making/ A&P                           Medical Decision Making Amount and/or Complexity of Data Reviewed Labs: ordered. Decision-making details documented in ED Course. Radiology: ordered and independent interpretation performed. Decision-making details documented in ED Course. ECG/medicine tests: ordered and independent interpretation performed. Decision-making details documented in ED Course.  Risk Prescription drug management.  Patient transferred from urgent care with concern for stroke given her facial weakness.  Seen on arrival with Dr. Lorrin Goodell of neurology who favors Bell's palsy and canceled code stroke.  Patient out of intervention due to delay in presentation anyway.  Basic labs will be obtained including blood sugar. CT scan obtained to rule out mass or schwannoma.  Results reviewed and interpreted by me.  There are multiple areas of white matter age-indeterminate infarcts.  D/w Dr. Lorrin Goodell of neurology. He agrees MRI should be obtained for further evaluation of infarcts but not necessary for Bell's palsy.  At shift change, lab studies are pending as well as MRI for further evaluation of age-indeterminate infarcts.  No acute infarct anticipate discharge home with treatment for Bell's palsy including steroids and valacyclovir.  Care to be transferred at shift change.          Final Clinical Impression(s) / ED Diagnoses Final diagnoses:  Bell's palsy    Rx / DC Orders ED Discharge Orders          Ordered    valACYclovir (VALTREX) 1000 MG tablet  3 times daily        11/16/21 1534    predniSONE (DELTASONE) 50 MG tablet        11/16/21 1534    artificial tears (LACRILUBE) OINT ophthalmic ointment  At bedtime PRN        11/16/21 1534              Caelen Higinbotham, Annie Main, MD 11/16/21 1549

## 2021-11-16 NOTE — ED Provider Notes (Signed)
5:46 PM Care of the patient assumed at signout.  On my discussing her MRI results with her, 2 family members are present we lengthy conversation about MRI results, Bell's palsy, labs which were mildly abnormal in several regards, do not remarkably so.  Patient comfortable with outpatient follow-up, instructions, was discharged in stable condition.  Prior to discharge I again discussed her case with our neurologist, who again recommends discharge.   Carmin Muskrat, MD 11/16/21 [phone]

## 2021-11-16 NOTE — Discharge Instructions (Addendum)
Take the steroids and antiviral medication as prescribed.  Is important to monitor your blood sugars closely while you are taking the steroids.  Keep your eye well lubricated with the drops and taping her eye shut at night.  Follow-up with your doctor for recheck next week.  If you develop any eye pain or vision changes you should follow-up with your ophthalmologist.  Return to the ED if you develop worsening symptoms including difficulty speaking, difficulty swallowing, visual changes, behavior changes, weakness to arms or legs, chest pain, shortness of breath or any other concerns.

## 2021-11-16 NOTE — ED Provider Notes (Signed)
Patient seen by neuro team in triage.  Code stroke canceled.  Dr.Kahliqdina (Neuro) communicated with this provider that the patient's presentation is consistent with Bell's palsy.  He does not feel that neuroimaging is required.  He requested the patient be given treatment for Bell's palsy - specifically artificial tears and instructions on how to tape her eyelid shut.  He requests ophthalmologic referral.  ED Bed placement pending at time of this note.   Valarie Merino, MD 11/16/21 1323

## 2021-11-21 NOTE — ED Provider Notes (Signed)
Patient presents to urgent care for evaluation of right sided facial droop and slurred speech that she states began on Friday November 14, 2021. She denies history of cerebrovascular events. Her daughter accompanied her to urgent care and states that patient did not tell her children about her symptoms until this morning. Patient walks with a walker at baseline and is currently sitting in a wheelchair. She denies one sided arm or leg weakness. No Patient admits that her speech does sound a little bit different and slightly slurred since Friday November 14, 2021 as well. No recent falls or head trauma. No changes to patient's medications recently. She does not take blood thinning medications other than aspirin 81mg  once daily.She denies feeling dizzy, shortness of breath, chest pain, blurry vision, changes in visual acuity, and headache. She does report some lightheadedness.   She is sitting comfortably in a wheelchair in triage at time of exam. Right sided facial droop is present. She is able to open both eyes voluntarily and able to raise her left eyebrow, but not her right. She is not able to puff out her cheeks to the right side. No pronator drift, limb ataxia, or abnormal EOMs. She knows the year, her name, and the president. She has normal sensation to cheeks, bilateral upper extremities, and bilateral lower extremities. 5/5 power throughout with bilateral grip strength as well as dorsiflexion and plantar flexion bilaterally. Lung sounds clear to auscultation throughout. Heart sounds normal at a normal rate and regular rhythm.   CBG 175 in clinic.   Patient to go to the hospital via [facility] for further evaluation due to stroke symptoms. Discussed physical exam findings and plan of care with patient and daughter who verbalize understanding and agreement with plan. IV placed at urgent care prior to transport to ED. Patient discharged from urgent care to ED in stable condition with [facility].    Talbot Grumbling, [place] 11/21/21 2007

## 2021-12-10 ENCOUNTER — Encounter: Payer: Self-pay | Admitting: Internal Medicine

## 2021-12-10 ENCOUNTER — Ambulatory Visit (INDEPENDENT_AMBULATORY_CARE_PROVIDER_SITE_OTHER): Payer: Medicare Other | Admitting: Internal Medicine

## 2021-12-10 DIAGNOSIS — E1121 Type 2 diabetes mellitus with diabetic nephropathy: Secondary | ICD-10-CM

## 2021-12-10 DIAGNOSIS — G510 Bell's palsy: Secondary | ICD-10-CM | POA: Diagnosis not present

## 2021-12-10 DIAGNOSIS — N184 Chronic kidney disease, stage 4 (severe): Secondary | ICD-10-CM | POA: Diagnosis not present

## 2021-12-10 DIAGNOSIS — R413 Other amnesia: Secondary | ICD-10-CM | POA: Diagnosis not present

## 2021-12-10 DIAGNOSIS — N3281 Overactive bladder: Secondary | ICD-10-CM | POA: Diagnosis not present

## 2021-12-10 HISTORY — DX: Bell's palsy: G51.0

## 2021-12-10 NOTE — Assessment & Plan Note (Signed)
Myrbetriq not helping - d/c D/c spironolactone

## 2021-12-10 NOTE — Progress Notes (Signed)
Subjective:  Patient ID: Brandy Gonzales, female    DOB: [birth year]  Age: 81 y.o. MRN: [id number]  CC: No chief complaint on file.   HPI MESSINA KOSINSKI presents for post-hosp f/u C/o incontinence F/u HTN She had confusion   Outpatient Medications Prior to Visit  Medication Sig Dispense Refill   acetaminophen (TYLENOL) 500 MG tablet Take 500 mg by mouth every 6 (six) hours as needed.     aspirin 81 MG EC tablet Take 81 mg by mouth daily.     carvedilol (COREG) 25 MG tablet Take 1 tablet (25 mg total) by mouth 2 (two) times daily with a meal. 180 tablet 3   Cholecalciferol (VITAMIN D3) 50 MCG (2000 UT) capsule Take 1 capsule (2,000 Units total) by mouth daily. 100 capsule 3   dapagliflozin propanediol (FARXIGA) 10 MG TABS tablet Take 1 tablet (10 mg total) by mouth daily before breakfast. 90 tablet 3   glimepiride (AMARYL) 2 MG tablet Take 1 tablet (2 mg total) by mouth daily before breakfast. 90 tablet 3   omeprazole (PRILOSEC) 20 MG capsule Take 1 capsule (20 mg total) by mouth daily. 90 capsule 3   artificial tears (LACRILUBE) OINT ophthalmic ointment Place into both eyes at bedtime as needed for dry eyes. 1 Tube 0   azithromycin (ZITHROMAX Z-PAK) 250 MG tablet As directed 6 tablet 0   benzonatate (TESSALON) 100 MG capsule Take 1 capsule (100 mg total) by mouth 3 (three) times daily as needed for cough. 30 capsule 0   diclofenac Sodium (VOLTAREN) 1 % GEL Apply 2 g topically 4 (four) times daily. 100 g 3   mirabegron ER (MYRBETRIQ) 50 MG TB24 tablet Take 1 tablet (50 mg total) by mouth daily. 90 tablet 3   nitrofurantoin, macrocrystal-monohydrate, (MACROBID) 100 MG capsule Take 1 capsule (100 mg total) by mouth 2 (two) times daily. 10 capsule 0   predniSONE (DELTASONE) 50 MG tablet 1 tablet PO daily 7 tablet 0   spironolactone (ALDACTONE) 50 MG tablet Take 1 tablet (50 mg total) by mouth daily. for high blood pressure 90 tablet 3   valACYclovir (VALTREX) 1000 MG tablet Take 1 tablet (1,000  mg total) by mouth 3 (three) times daily. 21 tablet 0   No facility-administered medications prior to visit.    ROS: Review of Systems  Constitutional:  Positive for fatigue. Negative for activity change, appetite change, chills and unexpected weight change.  HENT:  Negative for congestion, mouth sores and sinus pressure.   Eyes:  Negative for visual disturbance.  Respiratory:  Negative for cough and chest tightness.   Gastrointestinal:  Negative for abdominal pain and nausea.  Genitourinary:  Negative for difficulty urinating, frequency and vaginal pain.  Musculoskeletal:  Positive for arthralgias and gait problem. Negative for back pain.  Skin:  Negative for pallor and rash.  Neurological:  Positive for dizziness. Negative for tremors, weakness, numbness and headaches.  Psychiatric/Behavioral:  Positive for confusion. Negative for sleep disturbance.     Objective:  BP 100/60 (BP Location: Left Arm, Patient Position: Sitting, Cuff Size: Normal)   Pulse 72   Temp 98.8 F (37.1 C) (Oral)   Ht 4\' 11"  (1.499 m)   Wt 125 lb (56.7 kg)   SpO2 99%   BMI 25.25 kg/m   BP Readings from Last 3 Encounters:  12/10/21 100/60  11/16/21 140/74  11/16/21 (!) 148/77    Wt Readings from Last 3 Encounters:  12/10/21 125 lb (56.7 kg)  11/16/21 134 lb (  60.8 kg)  10/21/21 134 lb (60.8 kg)    Physical Exam Constitutional:      General: She is not in acute distress.    Appearance: Normal appearance. She is well-developed.  HENT:     Head: Normocephalic.     Right Ear: External ear normal.     Left Ear: External ear normal.     Nose: Nose normal.  Eyes:     General:        Right eye: No discharge.        Left eye: No discharge.     Conjunctiva/sclera: Conjunctivae normal.     Pupils: Pupils are equal, round, and reactive to light.  Neck:     Thyroid: No thyromegaly.     Vascular: No JVD.     Trachea: No tracheal deviation.  Cardiovascular:     Rate and Rhythm: Normal rate and  regular rhythm.     Heart sounds: Normal heart sounds.  Pulmonary:     Effort: No respiratory distress.     Breath sounds: No stridor. No wheezing.  Abdominal:     General: Bowel sounds are normal. There is no distension.     Palpations: Abdomen is soft. There is no mass.     Tenderness: There is no abdominal tenderness. There is no guarding or rebound.  Musculoskeletal:        General: Tenderness present.     Cervical back: Normal range of motion and neck supple. No rigidity.  Lymphadenopathy:     Cervical: No cervical adenopathy.  Skin:    Findings: No erythema or rash.  Neurological:     Cranial Nerves: No cranial nerve deficit.     Motor: No abnormal muscle tone.     Coordination: Coordination normal.     Gait: Gait abnormal.     Deep Tendon Reflexes: Reflexes normal.  Psychiatric:        Mood and Affect: Mood normal.        Behavior: Behavior normal.   Antalgic gait, limping  Lab Results  Component Value Date   WBC 5.7 11/16/2021   HGB 11.5 (L) 11/16/2021   HCT 35.0 (L) 11/16/2021   PLT 157 11/16/2021   GLUCOSE 197 (H) 11/16/2021   CHOL 140 10/21/2021   TRIG 157.0 (H) 10/21/2021   HDL 37.00 (L) 10/21/2021   LDLDIRECT 78.9 10/22/2008   LDLCALC 72 10/21/2021   ALT 18 11/16/2021   AST 21 11/16/2021   NA 141 11/16/2021   K 4.6 11/16/2021   CL 113 (H) 11/16/2021   CREATININE 2.13 (H) 11/16/2021   BUN 28 (H) 11/16/2021   CO2 21 (L) 11/16/2021   TSH 2.02 10/21/2021   INR 1.03 08/16/2011   HGBA1C 7.7 (H) 10/21/2021   MICROALBUR <0.7 12/12/2020    MR BRAIN WO CONTRAST  Result Date: 11/16/2021 CLINICAL DATA:  Neuro deficit, acute, stroke suspected EXAM: MRI HEAD WITHOUT CONTRAST TECHNIQUE: Multiplanar, multiecho pulse sequences of the brain and surrounding structures were obtained without intravenous contrast. COMPARISON:  Same day CT head. FINDINGS: Brain: No acute infarction, acute hemorrhage, hydrocephalus, extra-axial collection or mass lesion. Moderate to severe  patchy white matter T2/FLAIR hyperintensities, compatible with chronic microvascular ischemic disease. Remote left frontal infarct with encephalomalacia. Additional remote infarct in the right parietal lobe with evidence of prior hemorrhage in this region. Multiple small remote infarcts in the cerebellum. Vascular: Major arterial flow voids are maintained skull base. Skull and upper cervical spine: Normal marrow signal. Sinuses/Orbits: Moderate maxillary sinus mucosal  thickening with mild mucosal thickening elsewhere. No acute orbital findings. Other: No mastoid effusions. IMPRESSION: 1. No evidence of acute intracranial abnormality. 2. Chronic microvascular ischemic disease and remote infarcts, described above. 3.  Cerebral atrophy (ICD10-G31.9). Electronically Signed   By: Margaretha Sheffield M.D.   On: 11/16/2021 17:05   CT Head Wo Contrast  Result Date: 11/16/2021 CLINICAL DATA:  Neuro deficit, acute, stroke suspected EXAM: CT HEAD WITHOUT CONTRAST TECHNIQUE: Contiguous axial images were obtained from the base of the skull through the vertex without intravenous contrast. RADIATION DOSE REDUCTION: This exam was performed according to the departmental dose-optimization program which includes automated exposure control, adjustment of the mA and/or kV according to patient size and/or use of iterative reconstruction technique. COMPARISON:  CT head March 09, 2009. FINDINGS: Brain: No evidence of acute large vascular territory infarction, hemorrhage, hydrocephalus, extra-axial collection or mass lesion/mass effect. Progressive patchy white matter hypodensities, nonspecific but compatible with chronic microvascular ischemic disease and age-indeterminant infarcts. Remote appearing left frontal infarct. Progressive atrophy. Vascular: No hyperdense vessel. Calcific intracranial atherosclerosis. Skull: No acute fracture. Sinuses/Orbits: Right maxillary sinus and left frontal mucosal thickening. Other: No mastoid  effusions IMPRESSION: 1. Multiple age indeterminate white matter infarcts (progressed since 2010) and remote appearing left frontal infarct that is new. An MRI could provide more sensitive evaluation. 2. No acute hemorrhage. 3. Progressive cerebral atrophy (ICD10-G31.9). Electronically Signed   By: Margaretha Sheffield M.D.   On: 11/16/2021 14:50    Assessment & Plan:   Problem List Items Addressed This Visit     Bell palsy    R side - better      CKD (chronic kidney disease) stage 4, GFR 15-29 ml/min ([place])    Hydrate well D/c spironolactone      Controlled type 2 diabetes mellitus with diabetic nephropathy (HCC)    Cont on Farxiga and Glimepiride       Diabetic nephropathy ([place])    Hydrate well D/c spironolactone Cont Farxiga      Memory loss    Probable Alzheimer's Options to treat discussed      OAB (overactive bladder)    Myrbetriq not helping - d/c D/c spironolactone         No orders of the defined types were placed in this encounter.     Follow-up: No follow-ups on file.  Walker Kehr, MD

## 2021-12-10 NOTE — Assessment & Plan Note (Signed)
R side - better

## 2021-12-10 NOTE — Assessment & Plan Note (Signed)
Hydrate well D/c spironolactone

## 2021-12-10 NOTE — Assessment & Plan Note (Signed)
Probable Alzheimer's Options to treat discussed

## 2021-12-10 NOTE — Patient Instructions (Signed)
Blue-Emu cream -- use 2-3 times a day ? ?

## 2021-12-10 NOTE — Assessment & Plan Note (Signed)
Cont on Farxiga and Glimepiride

## 2021-12-10 NOTE — Assessment & Plan Note (Signed)
Hydrate well D/c spironolactone Cont [place]

## 2022-01-20 ENCOUNTER — Other Ambulatory Visit: Payer: Self-pay

## 2022-01-21 ENCOUNTER — Encounter: Payer: Self-pay | Admitting: Internal Medicine

## 2022-01-21 ENCOUNTER — Ambulatory Visit (INDEPENDENT_AMBULATORY_CARE_PROVIDER_SITE_OTHER): Payer: Medicare Other | Admitting: Internal Medicine

## 2022-01-21 VITALS — BP 156/70 | HR 62 | Temp 98.0°F | Ht 59.0 in | Wt 130.8 lb

## 2022-01-21 DIAGNOSIS — R413 Other amnesia: Secondary | ICD-10-CM

## 2022-01-21 DIAGNOSIS — R609 Edema, unspecified: Secondary | ICD-10-CM

## 2022-01-21 DIAGNOSIS — N184 Chronic kidney disease, stage 4 (severe): Secondary | ICD-10-CM | POA: Diagnosis not present

## 2022-01-21 DIAGNOSIS — E1121 Type 2 diabetes mellitus with diabetic nephropathy: Secondary | ICD-10-CM | POA: Diagnosis not present

## 2022-01-21 LAB — COMPREHENSIVE METABOLIC PANEL
ALT: 22 U/L (ref 0–35)
AST: 22 U/L (ref 0–37)
Albumin: 3.8 g/dL (ref 3.5–5.2)
Alkaline Phosphatase: 93 U/L (ref 39–117)
BUN: 18 mg/dL (ref 6–23)
CO2: 26 mEq/L (ref 19–32)
Calcium: 10 mg/dL (ref 8.4–10.5)
Chloride: 109 mEq/L (ref 96–112)
Creatinine, Ser: 1.59 mg/dL — ABNORMAL HIGH (ref 0.40–1.20)
GFR: 30.17 mL/min — ABNORMAL LOW (ref >60.00)
Glucose, Bld: 162 mg/dL — ABNORMAL HIGH (ref 70–99)
Potassium: 4.3 mEq/L (ref 3.5–5.1)
Sodium: 143 mEq/L (ref 135–145)
Total Bilirubin: 0.8 mg/dL (ref 0.2–1.2)
Total Protein: 6.4 g/dL (ref 6.0–8.3)

## 2022-01-21 LAB — HEMOGLOBIN A1C: Hgb A1c MFr Bld: 7.1 % — ABNORMAL HIGH (ref 4.6–6.5)

## 2022-01-21 LAB — TSH: TSH: 1.26 u[IU]/mL (ref 0.35–5.50)

## 2022-01-21 MED ORDER — TORSEMIDE 20 MG PO TABS: 20.0000 mg | ORAL_TABLET | Freq: Every day | ORAL | 1 refills | Status: DC

## 2022-01-21 NOTE — Assessment & Plan Note (Signed)
Cont on Farxiga  and Glimepiride

## 2022-01-21 NOTE — Assessment & Plan Note (Addendum)
Will use Demadex prn w/caution Nephrology ref offered/discussed

## 2022-01-21 NOTE — Assessment & Plan Note (Signed)
Worse Will use Demadex prn w/caution

## 2022-01-21 NOTE — Assessment & Plan Note (Signed)
Options to treat discussed

## 2022-01-21 NOTE — Progress Notes (Signed)
Subjective:  Patient ID: Brandy Gonzales, female    DOB: Sep 30, 1940  Age: 81 y.o. MRN: [id number]  CC: Follow-up (3 month f/u)   HPI Brandy Gonzales presents for edema L>R  - worse x months F/u on HTN, DM  Outpatient Medications Prior to Visit  Medication Sig Dispense Refill   acetaminophen (TYLENOL) 500 MG tablet Take 500 mg by mouth every 6 (six) hours as needed.     aspirin 81 MG EC tablet Take 81 mg by mouth daily.     carvedilol (COREG) 25 MG tablet Take 1 tablet (25 mg total) by mouth 2 (two) times daily with a meal. 180 tablet 3   Cholecalciferol (VITAMIN D3) 50 MCG (2000 UT) capsule Take 1 capsule (2,000 Units total) by mouth daily. 100 capsule 3   dapagliflozin propanediol (FARXIGA) 10 MG TABS tablet Take 1 tablet (10 mg total) by mouth daily before breakfast. 90 tablet 3   glimepiride (AMARYL) 2 MG tablet Take 1 tablet (2 mg total) by mouth daily before breakfast. 90 tablet 3   omeprazole (PRILOSEC) 20 MG capsule Take 1 capsule (20 mg total) by mouth daily. 90 capsule 3   No facility-administered medications prior to visit.    ROS: Review of Systems  Constitutional:  Negative for activity change, appetite change, chills, fatigue and unexpected weight change.  HENT:  Negative for congestion, mouth sores and sinus pressure.   Eyes:  Negative for visual disturbance.  Respiratory:  Negative for cough and chest tightness.   Gastrointestinal:  Negative for abdominal pain and nausea.  Genitourinary:  Negative for difficulty urinating, frequency and vaginal pain.  Musculoskeletal:  Negative for back pain and gait problem.  Skin:  Negative for pallor and rash.  Neurological:  Negative for dizziness, tremors, weakness, numbness and headaches.  Psychiatric/Behavioral:  Negative for confusion, sleep disturbance and suicidal ideas.     Objective:  BP (!) 156/70 (BP Location: Left Arm)   Pulse 62   Temp 98 F (36.7 C) (Oral)   Ht 4\' 11"  (1.499 m)   Wt 130 lb 12.8 oz (59.3 kg)    SpO2 97%   BMI 26.42 kg/m   BP Readings from Last 3 Encounters:  01/21/22 (!) 156/70  12/10/21 100/60  11/16/21 140/74    Wt Readings from Last 3 Encounters:  01/21/22 130 lb 12.8 oz (59.3 kg)  12/10/21 125 lb (56.7 kg)  11/16/21 134 lb (60.8 kg)    Physical Exam Constitutional:      General: She is not in acute distress.    Appearance: She is well-developed. She is obese.  HENT:     Head: Normocephalic.     Right Ear: External ear normal.     Left Ear: External ear normal.     Nose: Nose normal.  Eyes:     General:        Right eye: No discharge.        Left eye: No discharge.     Conjunctiva/sclera: Conjunctivae normal.     Pupils: Pupils are equal, round, and reactive to light.  Neck:     Thyroid: No thyromegaly.     Vascular: No JVD.     Trachea: No tracheal deviation.  Cardiovascular:     Rate and Rhythm: Normal rate and regular rhythm.     Heart sounds: Normal heart sounds.  Pulmonary:     Effort: No respiratory distress.     Breath sounds: No stridor. No wheezing.  Abdominal:  General: Bowel sounds are normal. There is no distension.     Palpations: Abdomen is soft. There is no mass.     Tenderness: There is no abdominal tenderness. There is no guarding or rebound.  Musculoskeletal:        General: Tenderness present.     Cervical back: Normal range of motion and neck supple. No rigidity.     Right lower leg: Edema present.     Left lower leg: Edema present.  Lymphadenopathy:     Cervical: No cervical adenopathy.  Skin:    Findings: No erythema or rash.  Neurological:     Cranial Nerves: No cranial nerve deficit.     Motor: No abnormal muscle tone.     Coordination: Coordination normal.     Deep Tendon Reflexes: Reflexes normal.  Psychiatric:        Behavior: Behavior normal.        Thought Content: Thought content normal.        Judgment: Judgment normal.   L ankle 1-2+, R ankle trace edema  Lab Results  Component Value Date   WBC 5.7  11/16/2021   HGB 11.5 (L) 11/16/2021   HCT 35.0 (L) 11/16/2021   PLT 157 11/16/2021   GLUCOSE 197 (H) 11/16/2021   CHOL 140 10/21/2021   TRIG 157.0 (H) 10/21/2021   HDL 37.00 (L) 10/21/2021   LDLDIRECT 78.9 10/22/2008   LDLCALC 72 10/21/2021   ALT 18 11/16/2021   AST 21 11/16/2021   NA 141 11/16/2021   K 4.6 11/16/2021   CL 113 (H) 11/16/2021   CREATININE 2.13 (H) 11/16/2021   BUN 28 (H) 11/16/2021   CO2 21 (L) 11/16/2021   TSH 2.02 10/21/2021   INR 1.03 08/16/2011   HGBA1C 7.7 (H) 10/21/2021   MICROALBUR <0.7 12/12/2020    MR BRAIN WO CONTRAST  Result Date: 11/16/2021 CLINICAL DATA:  Neuro deficit, acute, stroke suspected EXAM: MRI HEAD WITHOUT CONTRAST TECHNIQUE: Multiplanar, multiecho pulse sequences of the brain and surrounding structures were obtained without intravenous contrast. COMPARISON:  Same day CT head. FINDINGS: Brain: No acute infarction, acute hemorrhage, hydrocephalus, extra-axial collection or mass lesion. Moderate to severe patchy white matter T2/FLAIR hyperintensities, compatible with chronic microvascular ischemic disease. Remote left frontal infarct with encephalomalacia. Additional remote infarct in the right parietal lobe with evidence of prior hemorrhage in this region. Multiple small remote infarcts in the cerebellum. Vascular: Major arterial flow voids are maintained skull base. Skull and upper cervical spine: Normal marrow signal. Sinuses/Orbits: Moderate maxillary sinus mucosal thickening with mild mucosal thickening elsewhere. No acute orbital findings. Other: No mastoid effusions. IMPRESSION: 1. No evidence of acute intracranial abnormality. 2. Chronic microvascular ischemic disease and remote infarcts, described above. 3.  Cerebral atrophy (ICD10-G31.9). Electronically Signed   By: Margaretha Sheffield M.D.   On: 11/16/2021 17:05   CT Head Wo Contrast  Result Date: 11/16/2021 CLINICAL DATA:  Neuro deficit, acute, stroke suspected EXAM: CT HEAD WITHOUT CONTRAST  TECHNIQUE: Contiguous axial images were obtained from the base of the skull through the vertex without intravenous contrast. RADIATION DOSE REDUCTION: This exam was performed according to the departmental dose-optimization program which includes automated exposure control, adjustment of the mA and/or kV according to patient size and/or use of iterative reconstruction technique. COMPARISON:  CT head March 09, 2009. FINDINGS: Brain: No evidence of acute large vascular territory infarction, hemorrhage, hydrocephalus, extra-axial collection or mass lesion/mass effect. Progressive patchy white matter hypodensities, nonspecific but compatible with chronic microvascular ischemic disease and  age-indeterminant infarcts. Remote appearing left frontal infarct. Progressive atrophy. Vascular: No hyperdense vessel. Calcific intracranial atherosclerosis. Skull: No acute fracture. Sinuses/Orbits: Right maxillary sinus and left frontal mucosal thickening. Other: No mastoid effusions IMPRESSION: 1. Multiple age indeterminate white matter infarcts (progressed since 2010) and remote appearing left frontal infarct that is new. An MRI could provide more sensitive evaluation. 2. No acute hemorrhage. 3. Progressive cerebral atrophy (ICD10-G31.9). Electronically Signed   By: Margaretha Sheffield M.D.   On: 11/16/2021 14:50    Assessment & Plan:   Problem List Items Addressed This Visit     CKD (chronic kidney disease) stage 4, GFR 15-29 ml/min (HCC)    Will use Demadex prn w/caution Nephrology ref offered/discussed      Relevant Orders   Ambulatory referral to Nephrology   Controlled type 2 diabetes mellitus with diabetic nephropathy ([place])    Cont on Farxiga  and Glimepiride       Relevant Orders   Comprehensive metabolic panel   Hemoglobin A1c   Edema - Primary    Worse Will use Demadex prn w/caution      Relevant Orders   Comprehensive metabolic panel   TSH   Memory loss    Options to treat discussed          Meds ordered this encounter  Medications   torsemide (DEMADEX) 20 MG tablet    Sig: Take 1-2 tablets (20-40 mg total) by mouth daily.    Dispense:  60 tablet    Refill:  1      Follow-up: Return in about 3 months (around 04/22/2022) for a follow-up visit.  Walker Kehr, MD

## 2022-02-14 DIAGNOSIS — I10 Essential (primary) hypertension: Secondary | ICD-10-CM | POA: Diagnosis not present

## 2022-02-14 DIAGNOSIS — R42 Dizziness and giddiness: Secondary | ICD-10-CM | POA: Diagnosis not present

## 2022-02-14 DIAGNOSIS — R55 Syncope and collapse: Secondary | ICD-10-CM | POA: Diagnosis not present

## 2022-02-21 ENCOUNTER — Encounter (HOSPITAL_COMMUNITY): Payer: Self-pay | Admitting: Emergency Medicine

## 2022-02-21 ENCOUNTER — Other Ambulatory Visit: Payer: Self-pay

## 2022-02-21 ENCOUNTER — Emergency Department (HOSPITAL_COMMUNITY)
Admission: EM | Admit: 2022-02-21 | Discharge: 2022-02-21 | Disposition: A | Discharge status: Home / Self Care | Payer: Medicare Other | Attending: Emergency Medicine | Admitting: Emergency Medicine

## 2022-02-21 ENCOUNTER — Emergency Department (HOSPITAL_COMMUNITY): Payer: Medicare Other

## 2022-02-21 DIAGNOSIS — W228XXA Striking against or struck by other objects, initial encounter: Secondary | ICD-10-CM | POA: Insufficient documentation

## 2022-02-21 DIAGNOSIS — Z79899 Other long term (current) drug therapy: Secondary | ICD-10-CM | POA: Insufficient documentation

## 2022-02-21 DIAGNOSIS — E119 Type 2 diabetes mellitus without complications: Secondary | ICD-10-CM | POA: Diagnosis not present

## 2022-02-21 DIAGNOSIS — S81802A Unspecified open wound, left lower leg, initial encounter: Secondary | ICD-10-CM | POA: Insufficient documentation

## 2022-02-21 DIAGNOSIS — S8992XA Unspecified injury of left lower leg, initial encounter: Secondary | ICD-10-CM | POA: Diagnosis present

## 2022-02-21 DIAGNOSIS — R600 Localized edema: Secondary | ICD-10-CM | POA: Diagnosis not present

## 2022-02-21 DIAGNOSIS — Z7982 Long term (current) use of aspirin: Secondary | ICD-10-CM | POA: Diagnosis not present

## 2022-02-21 DIAGNOSIS — I10 Essential (primary) hypertension: Secondary | ICD-10-CM | POA: Diagnosis not present

## 2022-02-21 DIAGNOSIS — L089 Local infection of the skin and subcutaneous tissue, unspecified: Secondary | ICD-10-CM

## 2022-02-21 LAB — CBC WITH DIFFERENTIAL/PLATELET
Abs Immature Granulocytes: 0.03 10*3/uL (ref 0.00–0.07)
Basophils Absolute: 0 10*3/uL (ref 0.0–0.1)
Basophils Relative: 0 %
Eosinophils Absolute: 0.2 10*3/uL (ref 0.0–0.5)
Eosinophils Relative: 3 %
HCT: 42.8 % (ref 36.0–46.0)
Hemoglobin: 14 g/dL (ref 12.0–15.0)
Immature Granulocytes: 0 %
Lymphocytes Relative: 24 %
Lymphs Abs: 1.7 10*3/uL (ref 0.7–4.0)
MCH: 30 pg (ref 26.0–34.0)
MCHC: 32.7 g/dL (ref 30.0–36.0)
MCV: 91.8 fL (ref 80.0–100.0)
Monocytes Absolute: 0.6 10*3/uL (ref 0.1–1.0)
Monocytes Relative: 9 %
Neutro Abs: 4.5 10*3/uL (ref 1.7–7.7)
Neutrophils Relative %: 64 %
Platelets: 210 10*3/uL (ref 150–400)
RBC: 4.66 MIL/uL (ref 3.87–5.11)
RDW: 12.2 % (ref 11.5–15.5)
WBC: 7.1 10*3/uL (ref 4.0–10.5)
nRBC: 0 % (ref 0.0–0.2)

## 2022-02-21 LAB — COMPREHENSIVE METABOLIC PANEL
ALT: 20 U/L (ref 0–44)
AST: 17 U/L (ref 15–41)
Albumin: 3.7 g/dL (ref 3.5–5.0)
Alkaline Phosphatase: 76 U/L (ref 38–126)
Anion gap: 13 (ref 5–15)
BUN: 26 mg/dL — ABNORMAL HIGH (ref 8–23)
CO2: 24 mmol/L (ref 22–32)
Calcium: 10.2 mg/dL (ref 8.9–10.3)
Chloride: 103 mmol/L (ref 98–111)
Creatinine, Ser: 1.71 mg/dL — ABNORMAL HIGH (ref 0.44–1.00)
GFR, Estimated: 30 mL/min — ABNORMAL LOW (ref >60)
Glucose, Bld: 200 mg/dL — ABNORMAL HIGH (ref 70–99)
Potassium: 3.5 mmol/L (ref 3.5–5.1)
Sodium: 140 mmol/L (ref 135–145)
Total Bilirubin: 1.3 mg/dL — ABNORMAL HIGH (ref 0.3–1.2)
Total Protein: 6.7 g/dL (ref 6.5–8.1)

## 2022-02-21 LAB — LACTIC ACID, PLASMA: Lactic Acid, Venous: 1.6 mmol/L (ref 0.5–1.9)

## 2022-02-21 MED ORDER — DOXYCYCLINE HYCLATE 100 MG PO CAPS: 100.0000 mg | ORAL_CAPSULE | Freq: Two times a day (BID) | ORAL | 0 refills | Status: DC

## 2022-02-21 MED ORDER — CEPHALEXIN 250 MG PO CAPS
500.0000 mg | ORAL_CAPSULE | Freq: Once | ORAL | Status: AC
  Administered 2022-02-21: 500 mg via ORAL
  Filled 2022-02-21: qty 2

## 2022-02-21 MED ORDER — CEPHALEXIN 500 MG PO CAPS: 500.0000 mg | ORAL_CAPSULE | Freq: Four times a day (QID) | ORAL | 0 refills | Status: AC

## 2022-02-21 MED ORDER — DOXYCYCLINE HYCLATE 100 MG PO TABS
100.0000 mg | ORAL_TABLET | Freq: Once | ORAL | Status: AC
Start: 2022-02-21 — End: 2022-02-21
  Administered 2022-02-21: 100 mg via ORAL
  Filled 2022-02-21: qty 1

## 2022-02-21 MED ORDER — CEPHALEXIN 500 MG PO CAPS: 500.0000 mg | ORAL_CAPSULE | Freq: Four times a day (QID) | ORAL | 0 refills | Status: DC

## 2022-02-21 NOTE — ED Notes (Signed)
Pt A&OX4 ambulatory at d/c with independent steady gait. Pt verbalized understanding of d/c instructions, prescription and follow up care. 

## 2022-02-21 NOTE — Discharge Instructions (Signed)
Can continue to clean with saline.  Recommend apply bacitracin to wound twice daily. Take antibiotics as prescribed and complete the full course. Call your primary care provider, recommend wound check in 2 days. Return to ER for worsening or concerning symptoms.  Provided with referral to local wound care center for further care.

## 2022-02-21 NOTE — ED Notes (Signed)
Patient transported to CT 

## 2022-02-21 NOTE — ED Notes (Signed)
Pt has a wound on anterior of left shin measuring 3.04cmx6.04cm. The bedding of wound is pink and the right side of wound has green and black eschar. The skin surrounding the wound is excoriated

## 2022-02-21 NOTE — ED Triage Notes (Signed)
Patient from home accompanied by daughter w/ a infected open wound to the left shin area. Patient's family stated they noticed it last week. Patient is a diabetic. Denies any fevers at home.

## 2022-02-21 NOTE — ED Notes (Signed)
Wound dressed with non adherent dressing and wrapped with medipore tape.
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
## 2022-03-25 ENCOUNTER — Telehealth: Payer: Self-pay

## 2022-03-25 NOTE — Telephone Encounter (Signed)
Called patient lvm to return call, to complete AWV at [phone].  If no return call within 15 minutes, patient may reschedule for the next available appointment with [facility] or [facility]. -S. Marc Sivertsen,LPN 

## 2022-04-16 ENCOUNTER — Telehealth: Payer: Self-pay | Admitting: Internal Medicine

## 2022-04-16 NOTE — Telephone Encounter (Signed)
LVM for pt to rtn my call to schedule AWV with [facility] call back # [phone] 

## 2022-04-22 ENCOUNTER — Ambulatory Visit (INDEPENDENT_AMBULATORY_CARE_PROVIDER_SITE_OTHER): Payer: Medicare Other | Admitting: Internal Medicine

## 2022-04-22 ENCOUNTER — Encounter: Payer: Self-pay | Admitting: Internal Medicine

## 2022-04-22 VITALS — BP 140/72 | HR 82 | Temp 97.8°F | Ht 59.0 in | Wt 123.2 lb

## 2022-04-22 DIAGNOSIS — K219 Gastro-esophageal reflux disease without esophagitis: Secondary | ICD-10-CM

## 2022-04-22 DIAGNOSIS — I10 Essential (primary) hypertension: Secondary | ICD-10-CM | POA: Diagnosis not present

## 2022-04-22 DIAGNOSIS — R609 Edema, unspecified: Secondary | ICD-10-CM

## 2022-04-22 DIAGNOSIS — N184 Chronic kidney disease, stage 4 (severe): Secondary | ICD-10-CM

## 2022-04-22 DIAGNOSIS — E1121 Type 2 diabetes mellitus with diabetic nephropathy: Secondary | ICD-10-CM | POA: Diagnosis not present

## 2022-04-22 DIAGNOSIS — S81802S Unspecified open wound, left lower leg, sequela: Secondary | ICD-10-CM

## 2022-04-22 LAB — COMPREHENSIVE METABOLIC PANEL
ALT: 29 U/L (ref 0–35)
AST: 29 U/L (ref 0–37)
Albumin: 4 g/dL (ref 3.5–5.2)
Alkaline Phosphatase: 128 U/L — ABNORMAL HIGH (ref 39–117)
BUN: 15 mg/dL (ref 6–23)
CO2: 30 mEq/L (ref 19–32)
Calcium: 10.2 mg/dL (ref 8.4–10.5)
Chloride: 107 mEq/L (ref 96–112)
Creatinine, Ser: 1.36 mg/dL — ABNORMAL HIGH (ref 0.40–1.20)
GFR: 36.33 mL/min — ABNORMAL LOW (ref >60.00)
Glucose, Bld: 165 mg/dL — ABNORMAL HIGH (ref 70–99)
Potassium: 4 mEq/L (ref 3.5–5.1)
Sodium: 144 mEq/L (ref 135–145)
Total Bilirubin: 1.5 mg/dL — ABNORMAL HIGH (ref 0.2–1.2)
Total Protein: 6.5 g/dL (ref 6.0–8.3)

## 2022-04-22 LAB — HEMOGLOBIN A1C: Hgb A1c MFr Bld: 7.8 % — ABNORMAL HIGH (ref 4.6–6.5)

## 2022-04-22 MED ORDER — CARVEDILOL 25 MG PO TABS: 25.0000 mg | ORAL_TABLET | Freq: Two times a day (BID) | ORAL | 3 refills | Status: DC

## 2022-04-22 MED ORDER — TORSEMIDE 20 MG PO TABS: 20.0000 mg | ORAL_TABLET | Freq: Every day | ORAL | 1 refills | Status: DC

## 2022-04-22 MED ORDER — TORSEMIDE 20 MG PO TABS: 20.0000 mg | ORAL_TABLET | Freq: Every day | ORAL | 1 refills | Status: DC | PRN

## 2022-04-22 MED ORDER — GLIMEPIRIDE 2 MG PO TABS: 2.0000 mg | ORAL_TABLET | Freq: Every day | ORAL | 3 refills | Status: DC

## 2022-04-22 MED ORDER — MUPIROCIN 2 % EX OINT: TOPICAL_OINTMENT | CUTANEOUS | 0 refills | Status: DC

## 2022-04-22 MED ORDER — OMEPRAZOLE 20 MG PO CPDR: 20.0000 mg | DELAYED_RELEASE_CAPSULE | Freq: Every day | ORAL | 3 refills | Status: DC

## 2022-04-22 MED ORDER — DAPAGLIFLOZIN PROPANEDIOL 10 MG PO TABS: 10.0000 mg | ORAL_TABLET | Freq: Every day | ORAL | 3 refills | Status: DC

## 2022-04-22 NOTE — Assessment & Plan Note (Signed)
Cont on Farxiga and Glimepiride  Check A1c

## 2022-04-22 NOTE — Assessment & Plan Note (Signed)
Chronic  Cont on Prilosec

## 2022-04-22 NOTE — Assessment & Plan Note (Signed)
Chronic On Coreg,  Farxiga
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
## 2022-04-22 NOTE — Assessment & Plan Note (Addendum)
Hydrate well Will use Demadex prn w/caution  Nephrology appt pending

## 2022-04-22 NOTE — Assessment & Plan Note (Signed)
Will use Demadex prn w/caution
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
## 2022-04-25 ENCOUNTER — Encounter (HOSPITAL_COMMUNITY): Payer: Self-pay | Admitting: Family Medicine

## 2022-04-25 ENCOUNTER — Inpatient Hospital Stay (HOSPITAL_COMMUNITY): Payer: Medicare Other | Admitting: Anesthesiology

## 2022-04-25 ENCOUNTER — Emergency Department (HOSPITAL_COMMUNITY): Payer: Medicare Other

## 2022-04-25 ENCOUNTER — Other Ambulatory Visit: Payer: Self-pay

## 2022-04-25 ENCOUNTER — Inpatient Hospital Stay (HOSPITAL_COMMUNITY)
Admission: EM | Admit: 2022-04-25 | Discharge: 2022-05-06 | DRG: 480 | Disposition: A | Discharge status: Skilled Nursing Facility | Payer: Medicare Other | Attending: Internal Medicine | Admitting: Internal Medicine

## 2022-04-25 ENCOUNTER — Inpatient Hospital Stay (HOSPITAL_COMMUNITY): Payer: Medicare Other

## 2022-04-25 DIAGNOSIS — E1165 Type 2 diabetes mellitus with hyperglycemia: Secondary | ICD-10-CM | POA: Diagnosis present

## 2022-04-25 DIAGNOSIS — E1122 Type 2 diabetes mellitus with diabetic chronic kidney disease: Secondary | ICD-10-CM | POA: Diagnosis present

## 2022-04-25 DIAGNOSIS — J1000 Influenza due to other identified influenza virus with unspecified type of pneumonia: Secondary | ICD-10-CM | POA: Diagnosis not present

## 2022-04-25 DIAGNOSIS — S72002A Fracture of unspecified part of neck of left femur, initial encounter for closed fracture: Secondary | ICD-10-CM

## 2022-04-25 DIAGNOSIS — Z7984 Long term (current) use of oral hypoglycemic drugs: Secondary | ICD-10-CM | POA: Diagnosis not present

## 2022-04-25 DIAGNOSIS — K219 Gastro-esophageal reflux disease without esophagitis: Secondary | ICD-10-CM | POA: Diagnosis present

## 2022-04-25 DIAGNOSIS — R55 Syncope and collapse: Secondary | ICD-10-CM | POA: Diagnosis not present

## 2022-04-25 DIAGNOSIS — Z7401 Bed confinement status: Secondary | ICD-10-CM | POA: Diagnosis not present

## 2022-04-25 DIAGNOSIS — R600 Localized edema: Secondary | ICD-10-CM | POA: Diagnosis not present

## 2022-04-25 DIAGNOSIS — W19XXXD Unspecified fall, subsequent encounter: Secondary | ICD-10-CM | POA: Diagnosis not present

## 2022-04-25 DIAGNOSIS — Z79899 Other long term (current) drug therapy: Secondary | ICD-10-CM | POA: Diagnosis not present

## 2022-04-25 DIAGNOSIS — E538 Deficiency of other specified B group vitamins: Secondary | ICD-10-CM | POA: Diagnosis not present

## 2022-04-25 DIAGNOSIS — Z7982 Long term (current) use of aspirin: Secondary | ICD-10-CM

## 2022-04-25 DIAGNOSIS — Y92009 Unspecified place in unspecified non-institutional (private) residence as the place of occurrence of the external cause: Secondary | ICD-10-CM

## 2022-04-25 DIAGNOSIS — D631 Anemia in chronic kidney disease: Secondary | ICD-10-CM | POA: Diagnosis not present

## 2022-04-25 DIAGNOSIS — Y838 Other surgical procedures as the cause of abnormal reaction of the patient, or of later complication, without mention of misadventure at the time of the procedure: Secondary | ICD-10-CM | POA: Diagnosis not present

## 2022-04-25 DIAGNOSIS — M80852A Other osteoporosis with current pathological fracture, left femur, initial encounter for fracture: Secondary | ICD-10-CM | POA: Diagnosis present

## 2022-04-25 DIAGNOSIS — R4701 Aphasia: Secondary | ICD-10-CM | POA: Diagnosis not present

## 2022-04-25 DIAGNOSIS — I82409 Acute embolism and thrombosis of unspecified deep veins of unspecified lower extremity: Secondary | ICD-10-CM | POA: Insufficient documentation

## 2022-04-25 DIAGNOSIS — M25572 Pain in left ankle and joints of left foot: Secondary | ICD-10-CM | POA: Diagnosis not present

## 2022-04-25 DIAGNOSIS — Z6823 Body mass index (BMI) 23.0-23.9, adult: Secondary | ICD-10-CM

## 2022-04-25 DIAGNOSIS — I824Y2 Acute embolism and thrombosis of unspecified deep veins of left proximal lower extremity: Secondary | ICD-10-CM | POA: Diagnosis not present

## 2022-04-25 DIAGNOSIS — N1832 Chronic kidney disease, stage 3b: Secondary | ICD-10-CM

## 2022-04-25 DIAGNOSIS — Z1152 Encounter for screening for COVID-19: Secondary | ICD-10-CM | POA: Diagnosis not present

## 2022-04-25 DIAGNOSIS — Z885 Allergy status to narcotic agent status: Secondary | ICD-10-CM

## 2022-04-25 DIAGNOSIS — I82432 Acute embolism and thrombosis of left popliteal vein: Secondary | ICD-10-CM | POA: Diagnosis not present

## 2022-04-25 DIAGNOSIS — I6523 Occlusion and stenosis of bilateral carotid arteries: Secondary | ICD-10-CM | POA: Diagnosis not present

## 2022-04-25 DIAGNOSIS — Z043 Encounter for examination and observation following other accident: Secondary | ICD-10-CM | POA: Diagnosis not present

## 2022-04-25 DIAGNOSIS — J09X1 Influenza due to identified novel influenza A virus with pneumonia: Secondary | ICD-10-CM

## 2022-04-25 DIAGNOSIS — J101 Influenza due to other identified influenza virus with other respiratory manifestations: Secondary | ICD-10-CM | POA: Diagnosis not present

## 2022-04-25 DIAGNOSIS — I82402 Acute embolism and thrombosis of unspecified deep veins of left lower extremity: Secondary | ICD-10-CM | POA: Diagnosis not present

## 2022-04-25 DIAGNOSIS — I129 Hypertensive chronic kidney disease with stage 1 through stage 4 chronic kidney disease, or unspecified chronic kidney disease: Secondary | ICD-10-CM | POA: Diagnosis present

## 2022-04-25 DIAGNOSIS — T8172XA Complication of vein following a procedure, not elsewhere classified, initial encounter: Secondary | ICD-10-CM | POA: Diagnosis not present

## 2022-04-25 DIAGNOSIS — Z751 Person awaiting admission to adequate facility elsewhere: Secondary | ICD-10-CM

## 2022-04-25 DIAGNOSIS — E1121 Type 2 diabetes mellitus with diabetic nephropathy: Secondary | ICD-10-CM

## 2022-04-25 DIAGNOSIS — Z888 Allergy status to other drugs, medicaments and biological substances status: Secondary | ICD-10-CM

## 2022-04-25 DIAGNOSIS — R739 Hyperglycemia, unspecified: Secondary | ICD-10-CM | POA: Diagnosis not present

## 2022-04-25 DIAGNOSIS — M80052A Age-related osteoporosis with current pathological fracture, left femur, initial encounter for fracture: Secondary | ICD-10-CM | POA: Diagnosis not present

## 2022-04-25 DIAGNOSIS — G8911 Acute pain due to trauma: Secondary | ICD-10-CM | POA: Diagnosis not present

## 2022-04-25 DIAGNOSIS — N179 Acute kidney failure, unspecified: Secondary | ICD-10-CM | POA: Diagnosis present

## 2022-04-25 DIAGNOSIS — I82412 Acute embolism and thrombosis of left femoral vein: Secondary | ICD-10-CM | POA: Diagnosis not present

## 2022-04-25 DIAGNOSIS — G510 Bell's palsy: Secondary | ICD-10-CM | POA: Diagnosis not present

## 2022-04-25 DIAGNOSIS — Z8249 Family history of ischemic heart disease and other diseases of the circulatory system: Secondary | ICD-10-CM | POA: Diagnosis not present

## 2022-04-25 DIAGNOSIS — I10 Essential (primary) hypertension: Secondary | ICD-10-CM

## 2022-04-25 DIAGNOSIS — E1140 Type 2 diabetes mellitus with diabetic neuropathy, unspecified: Secondary | ICD-10-CM | POA: Diagnosis not present

## 2022-04-25 DIAGNOSIS — N189 Chronic kidney disease, unspecified: Secondary | ICD-10-CM | POA: Diagnosis not present

## 2022-04-25 DIAGNOSIS — M79652 Pain in left thigh: Secondary | ICD-10-CM | POA: Diagnosis present

## 2022-04-25 DIAGNOSIS — W19XXXA Unspecified fall, initial encounter: Secondary | ICD-10-CM | POA: Diagnosis not present

## 2022-04-25 DIAGNOSIS — W1830XA Fall on same level, unspecified, initial encounter: Secondary | ICD-10-CM | POA: Diagnosis present

## 2022-04-25 DIAGNOSIS — M25551 Pain in right hip: Secondary | ICD-10-CM | POA: Diagnosis not present

## 2022-04-25 DIAGNOSIS — E43 Unspecified severe protein-calorie malnutrition: Secondary | ICD-10-CM | POA: Diagnosis present

## 2022-04-25 DIAGNOSIS — R4182 Altered mental status, unspecified: Secondary | ICD-10-CM | POA: Diagnosis not present

## 2022-04-25 DIAGNOSIS — S72142D Displaced intertrochanteric fracture of left femur, subsequent encounter for closed fracture with routine healing: Secondary | ICD-10-CM | POA: Diagnosis not present

## 2022-04-25 DIAGNOSIS — S72142A Displaced intertrochanteric fracture of left femur, initial encounter for closed fracture: Secondary | ICD-10-CM | POA: Diagnosis not present

## 2022-04-25 DIAGNOSIS — R410 Disorientation, unspecified: Secondary | ICD-10-CM | POA: Diagnosis not present

## 2022-04-25 DIAGNOSIS — E559 Vitamin D deficiency, unspecified: Secondary | ICD-10-CM | POA: Diagnosis not present

## 2022-04-25 DIAGNOSIS — M7989 Other specified soft tissue disorders: Secondary | ICD-10-CM | POA: Diagnosis not present

## 2022-04-25 DIAGNOSIS — R29818 Other symptoms and signs involving the nervous system: Secondary | ICD-10-CM | POA: Diagnosis not present

## 2022-04-25 DIAGNOSIS — Z7901 Long term (current) use of anticoagulants: Secondary | ICD-10-CM | POA: Diagnosis not present

## 2022-04-25 HISTORY — DX: Fracture of unspecified part of neck of left femur, initial encounter for closed fracture: S72.002A

## 2022-04-25 LAB — COMPREHENSIVE METABOLIC PANEL
ALT: 31 U/L (ref 0–44)
AST: 30 U/L (ref 15–41)
Albumin: 3.5 g/dL (ref 3.5–5.0)
Alkaline Phosphatase: 117 U/L (ref 38–126)
Anion gap: 7 (ref 5–15)
BUN: 14 mg/dL (ref 8–23)
CO2: 27 mmol/L (ref 22–32)
Calcium: 9.7 mg/dL (ref 8.9–10.3)
Chloride: 109 mmol/L (ref 98–111)
Creatinine, Ser: 1.46 mg/dL — ABNORMAL HIGH (ref 0.44–1.00)
GFR, Estimated: 36 mL/min — ABNORMAL LOW (ref >60)
Glucose, Bld: 244 mg/dL — ABNORMAL HIGH (ref 70–99)
Potassium: 3.6 mmol/L (ref 3.5–5.1)
Sodium: 143 mmol/L (ref 135–145)
Total Bilirubin: 1.1 mg/dL (ref 0.3–1.2)
Total Protein: 5.8 g/dL — ABNORMAL LOW (ref 6.5–8.1)

## 2022-04-25 LAB — CBC WITH DIFFERENTIAL/PLATELET
Abs Immature Granulocytes: 0.04 10*3/uL (ref 0.00–0.07)
Basophils Absolute: 0 10*3/uL (ref 0.0–0.1)
Basophils Relative: 0 %
Eosinophils Absolute: 0.1 10*3/uL (ref 0.0–0.5)
Eosinophils Relative: 2 %
HCT: 36.8 % (ref 36.0–46.0)
Hemoglobin: 12.1 g/dL (ref 12.0–15.0)
Immature Granulocytes: 1 %
Lymphocytes Relative: 13 %
Lymphs Abs: 0.9 10*3/uL (ref 0.7–4.0)
MCH: 30 pg (ref 26.0–34.0)
MCHC: 32.9 g/dL (ref 30.0–36.0)
MCV: 91.1 fL (ref 80.0–100.0)
Monocytes Absolute: 0.4 10*3/uL (ref 0.1–1.0)
Monocytes Relative: 6 %
Neutro Abs: 5.2 10*3/uL (ref 1.7–7.7)
Neutrophils Relative %: 78 %
Platelets: 173 10*3/uL (ref 150–400)
RBC: 4.04 MIL/uL (ref 3.87–5.11)
RDW: 14.3 % (ref 11.5–15.5)
WBC: 6.6 10*3/uL (ref 4.0–10.5)
nRBC: 0 % (ref 0.0–0.2)

## 2022-04-25 LAB — CBG MONITORING, ED: Glucose-Capillary: 209 mg/dL — ABNORMAL HIGH (ref 70–99)

## 2022-04-25 MED ORDER — LABETALOL HCL 5 MG/ML IV SOLN: 10.0000 mg | INTRAVENOUS | Status: DC | PRN

## 2022-04-25 MED ORDER — FENTANYL CITRATE (PF) 100 MCG/2ML IJ SOLN
50.0000 ug | Freq: Once | INTRAMUSCULAR | Status: AC
  Administered 2022-04-25: 50 ug via INTRAVENOUS

## 2022-04-25 MED ORDER — ROPIVACAINE HCL 5 MG/ML IJ SOLN
INTRAMUSCULAR | Status: DC | PRN
  Administered 2022-04-25: 30 mL

## 2022-04-25 MED ORDER — SENNOSIDES-DOCUSATE SODIUM 8.6-50 MG PO TABS
1.0000 | ORAL_TABLET | Freq: Every evening | ORAL | Status: DC | PRN
  Administered 2022-05-05: 1 via ORAL
  Filled 2022-04-25: qty 1

## 2022-04-25 MED ORDER — ONDANSETRON HCL 4 MG/2ML IJ SOLN: 4.0000 mg | Freq: Four times a day (QID) | INTRAMUSCULAR | Status: DC | PRN

## 2022-04-25 MED ORDER — TRANEXAMIC ACID-NACL 1000-0.7 MG/100ML-% IV SOLN
1000.0000 mg | INTRAVENOUS | Status: AC
  Administered 2022-04-26: 1000 mg via INTRAVENOUS
  Filled 2022-04-25: qty 100

## 2022-04-25 MED ORDER — POVIDONE-IODINE 10 % EX SWAB: 2.0000 | Freq: Once | CUTANEOUS | Status: DC

## 2022-04-25 MED ORDER — MUPIROCIN 2 % EX OINT
1.0000 | TOPICAL_OINTMENT | Freq: Two times a day (BID) | CUTANEOUS | Status: AC
  Administered 2022-04-26 – 2022-04-30 (×10): 1 via NASAL
  Filled 2022-04-25 (×4): qty 22

## 2022-04-25 MED ORDER — FENTANYL CITRATE PF 50 MCG/ML IJ SOSY: 25.0000 ug | PREFILLED_SYRINGE | INTRAMUSCULAR | Status: DC | PRN

## 2022-04-25 MED ORDER — CARVEDILOL 12.5 MG PO TABS
25.0000 mg | ORAL_TABLET | Freq: Two times a day (BID) | ORAL | Status: DC
  Administered 2022-04-26 – 2022-05-06 (×20): 25 mg via ORAL
  Filled 2022-04-25 (×12): qty 2
  Filled 2022-04-25: qty 1
  Filled 2022-04-25 (×3): qty 2
  Filled 2022-04-25: qty 1
  Filled 2022-04-25: qty 2
  Filled 2022-04-25: qty 1
  Filled 2022-04-25: qty 2

## 2022-04-25 MED ORDER — MORPHINE SULFATE (PF) 4 MG/ML IV SOLN
4.0000 mg | Freq: Once | INTRAVENOUS | Status: AC
  Administered 2022-04-25: 4 mg via INTRAVENOUS
  Filled 2022-04-25: qty 1

## 2022-04-25 MED ORDER — MORPHINE SULFATE (PF) 4 MG/ML IV SOLN: 4.0000 mg | Freq: Once | INTRAVENOUS | Status: DC

## 2022-04-25 MED ORDER — CHLORHEXIDINE GLUCONATE 4 % EX LIQD
60.0000 mL | Freq: Once | CUTANEOUS | Status: AC
  Administered 2022-04-26: 4 via TOPICAL

## 2022-04-25 MED ORDER — FENTANYL CITRATE (PF) 100 MCG/2ML IJ SOLN
INTRAMUSCULAR | Status: AC
  Filled 2022-04-25: qty 2

## 2022-04-25 MED ORDER — INSULIN ASPART 100 UNIT/ML IJ SOLN
0.0000 [IU] | INTRAMUSCULAR | Status: DC
  Administered 2022-04-25: 2 [IU] via SUBCUTANEOUS
  Administered 2022-04-26: 4 [IU] via SUBCUTANEOUS
  Administered 2022-04-26 (×2): 1 [IU] via SUBCUTANEOUS
  Administered 2022-04-27: 2 [IU] via SUBCUTANEOUS
  Administered 2022-04-27 – 2022-04-28 (×3): 1 [IU] via SUBCUTANEOUS
  Administered 2022-04-28: 3 [IU] via SUBCUTANEOUS
  Administered 2022-04-28: 2 [IU] via SUBCUTANEOUS
  Administered 2022-04-28: 3 [IU] via SUBCUTANEOUS
  Administered 2022-04-28 (×2): 1 [IU] via SUBCUTANEOUS
  Administered 2022-04-29: 2 [IU] via SUBCUTANEOUS
  Administered 2022-04-29: 1 [IU] via SUBCUTANEOUS

## 2022-04-25 MED ORDER — CLONIDINE HCL (ANALGESIA) 100 MCG/ML EP SOLN
EPIDURAL | Status: DC | PRN
  Administered 2022-04-25: 100 ug

## 2022-04-25 MED ORDER — ACETAMINOPHEN 325 MG PO TABS
650.0000 mg | ORAL_TABLET | Freq: Four times a day (QID) | ORAL | Status: DC | PRN
  Administered 2022-04-28 – 2022-05-04 (×9): 650 mg via ORAL
  Filled 2022-04-25 (×10): qty 2

## 2022-04-25 MED ORDER — CEFAZOLIN SODIUM-DEXTROSE 2-4 GM/100ML-% IV SOLN
2.0000 g | INTRAVENOUS | Status: AC
  Administered 2022-04-26: 2 g via INTRAVENOUS
  Filled 2022-04-25 (×2): qty 100
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
## 2022-04-25 NOTE — ED Provider Notes (Signed)
[place] EMERGENCY DEPARTMENT Provider Note   CSN: [id number] Arrival date & time: 04/25/22  1733     History  No chief complaint on file.   Brandy Gonzales is a 81 y.o. female hx of DM, hypertension, here presenting with fall.  Patient is from home and had a mechanical fall and directly landed on her left hip.  She was unable to bear weight on it afterwards.  Patient denies any head injury or loss of consciousness.  Patient is not currently on blood thinners.  The history is provided by the patient.       Home Medications Prior to Admission medications   Medication Sig Start Date End Date Taking? Authorizing Provider  acetaminophen (TYLENOL) 500 MG tablet Take 500 mg by mouth every 6 (six) hours as needed.    [provider]  aspirin 81 MG EC tablet Take 81 mg by mouth daily.    [provider]  carvedilol (COREG) 25 MG tablet Take 1 tablet (25 mg total) by mouth 2 (two) times daily with a meal. 04/22/22   [name], Brandy Lacks, MD  Cholecalciferol (VITAMIN D3) 50 MCG (2000 UT) capsule Take 1 capsule (2,000 Units total) by mouth daily. 08/02/19   [name], Brandy Lacks, MD  dapagliflozin propanediol (FARXIGA) 10 MG TABS tablet Take 1 tablet (10 mg total) by mouth daily before breakfast. 04/22/22   [name], Brandy Lacks, MD  glimepiride (AMARYL) 2 MG tablet Take 1 tablet (2 mg total) by mouth daily before breakfast. 04/22/22   [name], Brandy Lacks, MD  mupirocin ointment (BACTROBAN) 2 % On leg wound w/dressing change qd or bid 04/22/22   [name], Brandy Lacks, MD  omeprazole (PRILOSEC) 20 MG capsule Take 1 capsule (20 mg total) by mouth daily. 04/22/22   [name], Brandy Lacks, MD  torsemide (DEMADEX) 20 MG tablet Take 1-2 tablets (20-40 mg total) by mouth daily as needed. 04/22/22   [name], Brandy Lacks, MD      Allergies    Amlodipine besylate, Atenolol, Enalapril maleate, Oxycodone hcl, and Propoxyphene n-acetaminophen    Review of Systems    Review of Systems  Musculoskeletal:        Left hip pain  All other systems reviewed and are negative.   Physical Exam Updated Vital Signs BP (!) 181/86   Pulse 96   Temp 98.5 F (36.9 C) (Oral)   Resp 15   SpO2 94%  Physical Exam Vitals and nursing note reviewed.  Constitutional:      Appearance: Normal appearance.     Comments: Uncomfortable   HENT:     Head: Normocephalic and atraumatic.     Nose: Nose normal.     Mouth/Throat:     Mouth: Mucous membranes are moist.  Eyes:     Extraocular Movements: Extraocular movements intact.     Pupils: Pupils are equal, round, and reactive to light.  Cardiovascular:     Rate and Rhythm: Normal rate and regular rhythm.     Pulses: Normal pulses.     Heart sounds: Normal heart sounds.  Pulmonary:     Effort: Pulmonary effort is normal.     Breath sounds: Normal breath sounds.  Abdominal:     General: Abdomen is flat.     Palpations: Abdomen is soft.  Musculoskeletal:     Cervical back: Normal range of motion and neck supple.     Comments: Obvious deformity of the left hip. 2+ pulses able to wiggle toes   Skin:  General: Skin is warm.  Neurological:     General: No focal deficit present.     Mental Status: She is alert and oriented to person, place, and time.  Psychiatric:        Mood and Affect: Mood normal.        Behavior: Behavior normal.     ED Results / Procedures / Treatments   Labs (all labs ordered are listed, but only abnormal results are displayed) Labs Reviewed  COMPREHENSIVE METABOLIC PANEL - Abnormal; Notable for the following components:      Result Value   Glucose, Bld 244 (*)    Creatinine, Ser 1.46 (*)    Total Protein 5.8 (*)    GFR, Estimated 36 (*)    All other components within normal limits  CBC WITH DIFFERENTIAL/PLATELET    EKG None  Radiology DG Chest 1 View  Result Date: 04/25/2022 CLINICAL DATA:  Fall EXAM: CHEST  1 VIEW COMPARISON:  Chest x-ray 09/27/2021 FINDINGS: The heart  size and mediastinal contours are within normal limits. Both lungs are clear. The visualized skeletal structures are unremarkable. IMPRESSION: No active disease. Electronically Signed   By: Ronney Asters M.D.   On: 04/25/2022 19:49   DG Hip Unilat W or Wo Pelvis 2-3 Views Left  Result Date: 04/25/2022 CLINICAL DATA:  Hip pain, fall. EXAM: DG HIP (WITH OR WITHOUT PELVIS) 2-3V LEFT COMPARISON:  None. FINDINGS: There is a comminuted left femoral intratrochanteric fracture with apex lateral angulation and overlying soft tissue swelling. There is no dislocation identified. IMPRESSION: Comminuted left femoral intratrochanteric fracture. Electronically Signed   By: Ronney Asters M.D.   On: 04/25/2022 19:44    Procedures Procedures    Medications Ordered in ED Medications  morphine (PF) 4 MG/ML injection 4 mg (has no administration in time range)  morphine (PF) 4 MG/ML injection 4 mg (4 mg Intravenous Given 04/25/22 1854)    ED Course/ Medical Decision Making/ A&P                           Medical Decision Making Brandy Gonzales is a 81 y.o. female here presenting with fall.  Patient has obvious deformity of the left hip.  Concern for possible hip fracture.  Patient has no other injuries.  Will get hip x-rays and preop labs.  8:12 PM X-ray shows a comminuted left femoral intertrochanteric fracture.  I discussed case with Dr. Mable Gonzales from ortho.  He states that patient can be n.p.o. after midnight and be admitted for pain control by the hospitalist service.    Problems Addressed: Closed fracture of left hip, initial encounter [facility]): acute illness or injury  Amount and/or Complexity of Data Reviewed Labs: ordered. Decision-making details documented in ED Course. Radiology: ordered and independent interpretation performed. Decision-making details documented in ED Course. ECG/medicine tests: ordered and independent interpretation performed. Decision-making details documented in ED  Course.  Risk Prescription drug management. Decision regarding hospitalization.    Final Clinical Impression(s) / ED Diagnoses Final diagnoses:  None    Rx / DC Orders ED Discharge Orders     None         Drenda Freeze, MD 04/25/22 2013

## 2022-04-25 NOTE — ED Notes (Addendum)
The pt returned from pacu

## 2022-04-25 NOTE — Progress Notes (Signed)
Pt had one episode of emesis upon being shifted on stretcher. She denies pain and is not actively vomiting at this time. Patient is being transported to ED at this time by Ronne Binning, RN.

## 2022-04-25 NOTE — ED Notes (Signed)
Patient transported to X-ray 

## 2022-04-25 NOTE — Care Plan (Signed)
Patient discussed with EDP.  Imaging reviewed.  L pertroch fx.  NWB LLE Pain control Admit to medicine: preop clearance/risk stratification/optimization XR L femur in anticipation of long CMN NPO midnight for OR 04/25/2022 Hip block ordered  Full consult to follow.  Georgeanna Harrison M.D. Orthopaedic Surgery [facility]

## 2022-04-25 NOTE — Anesthesia Procedure Notes (Signed)
Anesthesia Regional Block: Peng block   Pre-Anesthetic Checklist: , timeout performed,  Correct Patient, Correct Site, Correct Laterality,  Correct Procedure, Correct Position, site marked,  Risks and benefits discussed,  Surgical consent,  Pre-op evaluation,  At surgeon's request and post-op pain management  Laterality: Left  Prep: Dura Prep       Needles:  Injection technique: Single-shot  Needle Type: Echogenic Stimulator Needle     Needle Length: 10cm  Needle Gauge: 20     Additional Needles:   Procedures:,,,, ultrasound used (permanent image in chart),,    Narrative:  Start time: 04/25/2022 9:10 PM End time: 04/25/2022 9:15 PM Injection made incrementally with aspirations every 5 mL.  Performed by: Personally  Anesthesiologist: Darral Dash, DO  Additional Notes: Patient identified. Risks/Benefits/Options discussed with patient including but not limited to bleeding, infection, nerve damage, failed block, incomplete pain control. Patient expressed understanding and wished to proceed. All questions were answered. Sterile technique was used throughout the entire procedure. Please see nursing notes for vital signs. Aspirated in 5cc intervals with injection for negative confirmation. Patient was given instructions on fall risk and not to get out of bed. All questions and concerns addressed with instructions to call with any issues or inadequate analgesia.

## 2022-04-25 NOTE — Progress Notes (Signed)
Pt arrived to PACU on a stretcher from ED. Vital signs obtained, patient denies pain or distress. Dr. Gloris Manchester informed and procedure (nerve block to left hip) began at 2105. Patient was given 50 mcg Fentanyl IV per Dr. Gloris Manchester orders prior to beginning procedure. Consent was obtained prior to beginning procedure.Procedure was completed at 2115 and the patient tolerated it very well.

## 2022-04-25 NOTE — Progress Notes (Signed)
Called ED Charge RN to inform that patient was finished with procedure. ED has no available transport at this time. Informed Charge nurse that as soon as I have another RN in PACU I will transport patient back to the ED.

## 2022-04-25 NOTE — ED Triage Notes (Addendum)
PT BIB [facility] for mechanical fall from standing while ambulating at home.  She uses walker but does not think she was using it when she fell.  She doesn't remember exactly how it happened but she endorses no LOC. She has hip pain on left and is only comfortable on right side at this time.  EMS gave 100 mcg of Fentanyl en route which seemed to help some.

## 2022-04-25 NOTE — ED Notes (Signed)
I called the nurse on 2000 gave a verbal report pt going up

## 2022-04-25 NOTE — Anesthesia Preprocedure Evaluation (Signed)
Anesthesia Evaluation  Patient identified by MRN, date of birth, ID band Patient awake    Reviewed: Allergy & Precautions, NPO status , Patient's Chart, lab work & pertinent test results  Airway Mallampati: II  TM Distance: >3 FB Neck ROM: Full    Dental no notable dental hx.    Pulmonary neg pulmonary ROS   Pulmonary exam normal        Cardiovascular hypertension, Pt. on medications  Rhythm:Regular Rate:Normal     Neuro/Psych negative neurological ROS  negative psych ROS   GI/Hepatic Neg liver ROS,GERD  Medicated,,  Endo/Other  diabetes    Renal/GU   negative genitourinary   Musculoskeletal  (+) Arthritis , Osteoarthritis,  Hip fx   Abdominal Normal abdominal exam  (+)   Peds  Hematology negative hematology ROS (+)   Anesthesia Other Findings   Reproductive/Obstetrics                             Anesthesia Physical Anesthesia Plan  ASA: 2  Anesthesia Plan: Regional   Post-op Pain Management: Regional block*   Induction:   PONV Risk Score and Plan: 2 and Treatment may vary due to age or medical condition  Airway Management Planned: Natural Airway and Nasal Cannula  Additional Equipment: None  Intra-op Plan:   Post-operative Plan:   Informed Consent: I have reviewed the patients History and Physical, chart, labs and discussed the procedure including the risks, benefits and alternatives for the proposed anesthesia with the patient or authorized representative who has indicated his/her understanding and acceptance.     Dental advisory given  Plan Discussed with:   Anesthesia Plan Comments: (Lab Results      Component                Value               Date                      WBC                      6.6                 04/25/2022                HGB                      12.1                04/25/2022                HCT                      36.8                 04/25/2022                MCV                      91.1                04/25/2022                PLT                      173  04/25/2022           )       Anesthesia Quick Evaluation

## 2022-04-26 ENCOUNTER — Inpatient Hospital Stay (HOSPITAL_COMMUNITY): Payer: Medicare Other

## 2022-04-26 ENCOUNTER — Inpatient Hospital Stay (HOSPITAL_COMMUNITY): Payer: Medicare Other | Admitting: Certified Registered Nurse Anesthetist

## 2022-04-26 ENCOUNTER — Encounter (HOSPITAL_COMMUNITY)
Admission: EM | Disposition: A | Discharge status: Skilled Nursing Facility | Payer: Self-pay | Source: Home / Self Care | Attending: Internal Medicine

## 2022-04-26 ENCOUNTER — Other Ambulatory Visit: Payer: Self-pay

## 2022-04-26 ENCOUNTER — Encounter (HOSPITAL_COMMUNITY): Payer: Self-pay | Admitting: Family Medicine

## 2022-04-26 DIAGNOSIS — S72142A Displaced intertrochanteric fracture of left femur, initial encounter for closed fracture: Secondary | ICD-10-CM | POA: Diagnosis not present

## 2022-04-26 DIAGNOSIS — I129 Hypertensive chronic kidney disease with stage 1 through stage 4 chronic kidney disease, or unspecified chronic kidney disease: Secondary | ICD-10-CM

## 2022-04-26 DIAGNOSIS — E1122 Type 2 diabetes mellitus with diabetic chronic kidney disease: Secondary | ICD-10-CM

## 2022-04-26 DIAGNOSIS — D631 Anemia in chronic kidney disease: Secondary | ICD-10-CM

## 2022-04-26 DIAGNOSIS — S72002A Fracture of unspecified part of neck of left femur, initial encounter for closed fracture: Secondary | ICD-10-CM | POA: Diagnosis not present

## 2022-04-26 DIAGNOSIS — N189 Chronic kidney disease, unspecified: Secondary | ICD-10-CM

## 2022-04-26 HISTORY — PX: INTRAMEDULLARY (IM) NAIL INTERTROCHANTERIC: SHX5875

## 2022-04-26 LAB — CBC
HCT: 34.7 % — ABNORMAL LOW (ref 36.0–46.0)
Hemoglobin: 11.8 g/dL — ABNORMAL LOW (ref 12.0–15.0)
MCH: 30.3 pg (ref 26.0–34.0)
MCHC: 34 g/dL (ref 30.0–36.0)
MCV: 89 fL (ref 80.0–100.0)
Platelets: 158 10*3/uL (ref 150–400)
RBC: 3.9 MIL/uL (ref 3.87–5.11)
RDW: 14.4 % (ref 11.5–15.5)
WBC: 7.4 10*3/uL (ref 4.0–10.5)
nRBC: 0 % (ref 0.0–0.2)

## 2022-04-26 LAB — GLUCOSE, CAPILLARY
Glucose-Capillary: 134 mg/dL — ABNORMAL HIGH (ref 70–99)
Glucose-Capillary: 135 mg/dL — ABNORMAL HIGH (ref 70–99)
Glucose-Capillary: 142 mg/dL — ABNORMAL HIGH (ref 70–99)
Glucose-Capillary: 153 mg/dL — ABNORMAL HIGH (ref 70–99)
Glucose-Capillary: 154 mg/dL — ABNORMAL HIGH (ref 70–99)
Glucose-Capillary: 159 mg/dL — ABNORMAL HIGH (ref 70–99)
Glucose-Capillary: 189 mg/dL — ABNORMAL HIGH (ref 70–99)
Glucose-Capillary: 306 mg/dL — ABNORMAL HIGH (ref 70–99)

## 2022-04-26 LAB — BASIC METABOLIC PANEL
Anion gap: 10 (ref 5–15)
BUN: 12 mg/dL (ref 8–23)
CO2: 23 mmol/L (ref 22–32)
Calcium: 9.7 mg/dL (ref 8.9–10.3)
Chloride: 110 mmol/L (ref 98–111)
Creatinine, Ser: 1.25 mg/dL — ABNORMAL HIGH (ref 0.44–1.00)
GFR, Estimated: 43 mL/min — ABNORMAL LOW (ref >60)
Glucose, Bld: 165 mg/dL — ABNORMAL HIGH (ref 70–99)
Potassium: 3.7 mmol/L (ref 3.5–5.1)
Sodium: 143 mmol/L (ref 135–145)

## 2022-04-26 LAB — TYPE AND SCREEN
ABO/RH(D): O POS
Antibody Screen: NEGATIVE

## 2022-04-26 LAB — ABO/RH: ABO/RH(D): O POS

## 2022-04-26 LAB — SURGICAL PCR SCREEN
MRSA, PCR: NEGATIVE
Staphylococcus aureus: NEGATIVE

## 2022-04-26 SURGERY — FIXATION, FRACTURE, INTERTROCHANTERIC, WITH INTRAMEDULLARY ROD
Anesthesia: Choice | Laterality: Left

## 2022-04-26 MED ORDER — ACETAMINOPHEN 325 MG PO TABS: 325.0000 mg | ORAL_TABLET | Freq: Four times a day (QID) | ORAL | Status: DC | PRN

## 2022-04-26 MED ORDER — EPHEDRINE SULFATE-NACL 50-0.9 MG/10ML-% IV SOSY
PREFILLED_SYRINGE | INTRAVENOUS | Status: DC | PRN
  Administered 2022-04-26: 5 mg via INTRAVENOUS

## 2022-04-26 MED ORDER — CEFAZOLIN SODIUM-DEXTROSE 2-4 GM/100ML-% IV SOLN
2.0000 g | Freq: Four times a day (QID) | INTRAVENOUS | Status: AC
  Administered 2022-04-26 (×2): 2 g via INTRAVENOUS
  Filled 2022-04-26 (×2): qty 100

## 2022-04-26 MED ORDER — MENTHOL 3 MG MT LOZG: 1.0000 | LOZENGE | OROMUCOSAL | Status: DC | PRN

## 2022-04-26 MED ORDER — ONDANSETRON HCL 4 MG/2ML IJ SOLN
INTRAMUSCULAR | Status: DC | PRN
  Administered 2022-04-26: 4 mg via INTRAVENOUS

## 2022-04-26 MED ORDER — METOCLOPRAMIDE HCL 5 MG/ML IJ SOLN: 5.0000 mg | Freq: Three times a day (TID) | INTRAMUSCULAR | Status: DC | PRN

## 2022-04-26 MED ORDER — ONDANSETRON HCL 4 MG/2ML IJ SOLN: 4.0000 mg | Freq: Four times a day (QID) | INTRAMUSCULAR | Status: DC | PRN

## 2022-04-26 MED ORDER — METOCLOPRAMIDE HCL 5 MG PO TABS: 5.0000 mg | ORAL_TABLET | Freq: Three times a day (TID) | ORAL | Status: DC | PRN

## 2022-04-26 MED ORDER — PHENOL 1.4 % MT LIQD: 1.0000 | OROMUCOSAL | Status: DC | PRN

## 2022-04-26 MED ORDER — ROCURONIUM BROMIDE 10 MG/ML (PF) SYRINGE
PREFILLED_SYRINGE | INTRAVENOUS | Status: AC
  Filled 2022-04-26: qty 10

## 2022-04-26 MED ORDER — PROPOFOL 500 MG/50ML IV EMUL
INTRAVENOUS | Status: DC | PRN
  Administered 2022-04-26: 50 ug/kg/min via INTRAVENOUS

## 2022-04-26 MED ORDER — PHENYLEPHRINE HCL-NACL 20-0.9 MG/250ML-% IV SOLN
INTRAVENOUS | Status: DC | PRN
  Administered 2022-04-26: 60 ug/min via INTRAVENOUS

## 2022-04-26 MED ORDER — ENOXAPARIN SODIUM 40 MG/0.4ML IJ SOSY: 40.0000 mg | PREFILLED_SYRINGE | INTRAMUSCULAR | 0 refills | Status: DC

## 2022-04-26 MED ORDER — HYDROCODONE-ACETAMINOPHEN 5-325 MG PO TABS
1.0000 | ORAL_TABLET | ORAL | Status: DC | PRN
  Administered 2022-04-26 – 2022-05-05 (×2): 1 via ORAL
  Filled 2022-04-26 (×2): qty 1

## 2022-04-26 MED ORDER — CHLORHEXIDINE GLUCONATE 0.12 % MT SOLN
OROMUCOSAL | Status: AC
  Administered 2022-04-26: 15 mL via OROMUCOSAL
  Filled 2022-04-26: qty 15

## 2022-04-26 MED ORDER — TRANEXAMIC ACID-NACL 1000-0.7 MG/100ML-% IV SOLN
INTRAVENOUS | Status: AC
  Filled 2022-04-26: qty 100

## 2022-04-26 MED ORDER — ONDANSETRON HCL 4 MG PO TABS: 4.0000 mg | ORAL_TABLET | Freq: Four times a day (QID) | ORAL | Status: DC | PRN

## 2022-04-26 MED ORDER — HYDROCODONE-ACETAMINOPHEN 7.5-325 MG PO TABS
1.0000 | ORAL_TABLET | ORAL | Status: DC | PRN
  Administered 2022-04-27: 1 via ORAL
  Filled 2022-04-26: qty 1

## 2022-04-26 MED ORDER — BUPIVACAINE IN DEXTROSE 0.75-8.25 % IT SOLN
INTRATHECAL | Status: DC | PRN
  Administered 2022-04-26: 1.6 mL via INTRATHECAL

## 2022-04-26 MED ORDER — ACETAMINOPHEN 500 MG PO TABS
500.0000 mg | ORAL_TABLET | Freq: Four times a day (QID) | ORAL | Status: AC
  Administered 2022-04-26 – 2022-04-27 (×4): 500 mg via ORAL
  Filled 2022-04-26 (×4): qty 1

## 2022-04-26 MED ORDER — DOCUSATE SODIUM 100 MG PO CAPS
100.0000 mg | ORAL_CAPSULE | Freq: Two times a day (BID) | ORAL | Status: DC
  Administered 2022-04-26 – 2022-05-06 (×13): 100 mg via ORAL
  Filled 2022-04-26 (×13): qty 1

## 2022-04-26 MED ORDER — HYDROCODONE-ACETAMINOPHEN 5-325 MG PO TABS: 1.0000 | ORAL_TABLET | Freq: Four times a day (QID) | ORAL | 0 refills | Status: DC | PRN

## 2022-04-26 MED ORDER — INSULIN ASPART 100 UNIT/ML IJ SOLN: 0.0000 [IU] | INTRAMUSCULAR | Status: DC | PRN

## 2022-04-26 MED ORDER — VANCOMYCIN HCL 1000 MG IV SOLR
INTRAVENOUS | Status: DC | PRN
  Administered 2022-04-26: 1000 mg via TOPICAL

## 2022-04-26 MED ORDER — EPHEDRINE 5 MG/ML INJ
INTRAVENOUS | Status: AC
  Filled 2022-04-26: qty 5

## 2022-04-26 MED ORDER — LIDOCAINE 2% (20 MG/ML) 5 ML SYRINGE
INTRAMUSCULAR | Status: AC
  Filled 2022-04-26: qty 5

## 2022-04-26 MED ORDER — 0.9 % SODIUM CHLORIDE (POUR BTL) OPTIME
TOPICAL | Status: DC | PRN
  Administered 2022-04-26: 1000 mL

## 2022-04-26 MED ORDER — ONDANSETRON HCL 4 MG/2ML IJ SOLN
INTRAMUSCULAR | Status: AC
  Filled 2022-04-26: qty 2

## 2022-04-26 MED ORDER — TRANEXAMIC ACID-NACL 1000-0.7 MG/100ML-% IV SOLN
1000.0000 mg | Freq: Once | INTRAVENOUS | Status: AC
  Administered 2022-04-26: 1000 mg via INTRAVENOUS
  Filled 2022-04-26: qty 100

## 2022-04-26 MED ORDER — MORPHINE SULFATE (PF) 2 MG/ML IV SOLN: 0.5000 mg | INTRAVENOUS | Status: DC | PRN

## 2022-04-26 MED ORDER — VANCOMYCIN HCL 1000 MG IV SOLR
INTRAVENOUS | Status: AC
  Filled 2022-04-26: qty 20

## 2022-04-26 MED ORDER — PROPOFOL 10 MG/ML IV BOLUS
INTRAVENOUS | Status: DC | PRN
  Administered 2022-04-26: 20 mg via INTRAVENOUS

## 2022-04-26 MED ORDER — CHLORHEXIDINE GLUCONATE 0.12 % MT SOLN: 15.0000 mL | Freq: Once | OROMUCOSAL | Status: AC

## 2022-04-26 MED ORDER — ORAL CARE MOUTH RINSE: 15.0000 mL | Freq: Once | OROMUCOSAL | Status: AC

## 2022-04-26 MED ORDER — ENOXAPARIN SODIUM 40 MG/0.4ML IJ SOSY
40.0000 mg | PREFILLED_SYRINGE | INTRAMUSCULAR | Status: DC
  Administered 2022-04-27 – 2022-05-04 (×8): 40 mg via SUBCUTANEOUS
  Filled 2022-04-26 (×8): qty 0.4

## 2022-04-26 MED ORDER — LACTATED RINGERS IV SOLN: INTRAVENOUS | Status: DC

## 2022-04-26 SURGICAL SUPPLY — 51 items
ADH SKN CLS APL DERMABOND .7 (GAUZE/BANDAGES/DRESSINGS) ×2
ADH SKN CLS LQ APL DERMABOND (GAUZE/BANDAGES/DRESSINGS) ×1
APL PRP STRL LF DISP 70% ISPRP (MISCELLANEOUS) ×1
BAG COUNTER SPONGE SURGICOUNT (BAG) ×1 IMPLANT
BAG SPNG CNTER NS LX DISP (BAG) ×1
BIT DRILL CANN 16 HIP (BIT) IMPLANT
BIT DRILL CANN STP 6/9 HIP (BIT) IMPLANT
BIT DRILL SHORT 4.2 (BIT) IMPLANT
BNDG COHESIVE 6X5 TAN NS LF (GAUZE/BANDAGES/DRESSINGS) ×1 IMPLANT
CHLORAPREP W/TINT 26 (MISCELLANEOUS) ×1 IMPLANT
CLSR STERI-STRIP ANTIMIC 1/2X4 (GAUZE/BANDAGES/DRESSINGS) IMPLANT
COVER PERINEAL POST (MISCELLANEOUS) ×1 IMPLANT
COVER SURGICAL LIGHT HANDLE (MISCELLANEOUS) ×1 IMPLANT
DERMABOND ADVANCED .7 DNX12 (GAUZE/BANDAGES/DRESSINGS) ×2 IMPLANT
DERMABOND ADVANCED .7 DNX6 (GAUZE/BANDAGES/DRESSINGS) IMPLANT
DRAPE C-ARM 42X72 X-RAY (DRAPES) ×1 IMPLANT
DRAPE C-ARMOR (DRAPES) ×1 IMPLANT
DRAPE STERI IOBAN 125X83 (DRAPES) ×1 IMPLANT
DRILL BIT SHORT 4.2 (BIT) ×2
DRSG AQUACEL AG ADV 3.5X 6 (GAUZE/BANDAGES/DRESSINGS) IMPLANT
DRSG MEPILEX BORDER 4X8 (GAUZE/BANDAGES/DRESSINGS) ×2 IMPLANT
ELECT REM PT RETURN 9FT ADLT (ELECTROSURGICAL) ×1 IMPLANT
ELECTRODE REM PT RTRN 9FT ADLT (ELECTROSURGICAL) ×1 IMPLANT
GLOVE BIOGEL PI IND STRL 8 (GLOVE) ×2 IMPLANT
GLOVE ECLIPSE 7.5 STRL STRAW (GLOVE) ×2 IMPLANT
GLOVE SURG ORTHO LTX SZ7.5 (GLOVE) ×2 IMPLANT
GOWN STRL REIN XL XLG (GOWN DISPOSABLE) ×1 IMPLANT
GOWN STRL REUS W/ TWL LRG LVL3 (GOWN DISPOSABLE) ×1 IMPLANT
GOWN STRL REUS W/ TWL XL LVL3 (GOWN DISPOSABLE) ×1 IMPLANT
GOWN STRL REUS W/TWL LRG LVL3 (GOWN DISPOSABLE) ×1
GOWN STRL REUS W/TWL XL LVL3 (GOWN DISPOSABLE) ×1
GUIDEWIRE 3.2X400 (WIRE) IMPLANT
KIT BASIN OR (CUSTOM PROCEDURE TRAY) ×1 IMPLANT
MANIFOLD NEPTUNE II (INSTRUMENTS) ×1 IMPLANT
NAIL TFNA CANN 10X130X340 LEFT (Nail) IMPLANT
NS IRRIG 1000ML POUR BTL (IV SOLUTION) ×1 IMPLANT
PACK GENERAL/GYN (CUSTOM PROCEDURE TRAY) ×1 IMPLANT
PAD ARMBOARD 7.5X6 YLW CONV (MISCELLANEOUS) ×2 IMPLANT
REAMER ROD DEEP FLUTE 2.5X950 (INSTRUMENTS) IMPLANT
SCREW LOCK IM 5X36 (Screw) ×1 IMPLANT
SCREW LOCK IM 5X38X125 (Screw) IMPLANT
SCREW LOCK X25 36X5X IM (Screw) IMPLANT
SCREW TFNA FENS 10.35X80 (Screw) IMPLANT
SPONGE T-LAP 18X18 ~~LOC~~+RFID (SPONGE) ×2 IMPLANT
SUT MNCRL AB 3-0 PS2 18 (SUTURE) ×2 IMPLANT
SUT MON AB 2-0 CT1 36 (SUTURE) ×2 IMPLANT
SUT PDS AB 1 CT  36 (SUTURE) ×2
SUT PDS AB 1 CT 36 (SUTURE) ×2 IMPLANT
SUT VIC AB 0 CT1 27 (SUTURE) ×2
SUT VIC AB 0 CT1 27XBRD ANBCTR (SUTURE) ×2 IMPLANT
TOWEL GREEN STERILE (TOWEL DISPOSABLE) ×2 IMPLANT

## 2022-04-26 NOTE — Anesthesia Procedure Notes (Signed)
Spinal  Patient location during procedure: OR Start time: 04/26/2022 11:23 AM End time: 04/26/2022 11:29 AM Reason for block: surgical anesthesia Staffing Anesthesiologist: Oleta Mouse, MD Performed by: Oleta Mouse, MD Authorized by: Oleta Mouse, MD   Preanesthetic Checklist Completed: patient identified, IV checked, risks and benefits discussed, surgical consent, monitors and equipment checked, pre-op evaluation and timeout performed Spinal Block Patient position: left lateral decubitus Prep: DuraPrep Patient monitoring: heart rate, cardiac monitor, continuous pulse ox and blood pressure Approach: midline Location: L3-4 Injection technique: single-shot Needle Needle type: Pencan  Needle gauge: 24 G Needle length: 9 cm Assessment Sensory level: T6 Events: CSF return

## 2022-04-26 NOTE — Discharge Instructions (Addendum)
Discharge instructions for Dr. Georgeanna Harrison, M.D., Orthopaedic Surgeon, [place]:  Diet: As you were doing prior to hospitalization, unless instructed otherwise by medical team, dietary/nutrition team, etc. Dressing:  Keep AQUACEL on and dry for up to 14 days.  Do not allow surgical area to get wet before that.  Remove AQUACEL dressing after 14 days and allow area to get wet in shower but DO NOT SUBMERGE until wound is evaluated in clinic.  In most cases skin glue is used and no additional dressing is necessary.  Shower:  Unless otherwise specified, may shower but keep the wounds dry, use an occlusive plastic wrap, NO SOAKING IN TUB.  If the bandage gets wet, change with a clean dry gauze.  Unless otherwise specified, after 5 days dressing(s) should be removed (7 days for PREVENA dressings) and wound(s) may get wet in the shower by allowing water to gently run over.  Again, no soaking in tub, and do NOT submerge for at least 2 weeks!!! Activity:  Increase activity slowly as tolerated.  If you right leg is injured or immobilized, no driving for 6 weeks or until discussed with your surgeon.  If you have an injury or immobilization of the left lower extremity you may not operate a clutch. Please note that driving with any kind of immobilization for the upper extremity (sling, shoulder brace, splint, cast, etc.) may also be considered impaired driving and should not be attempted. Weight Bearing: WEIGHTBEARING AS TOLERATED (WBAT) on the LEFT LOWER EXTREMITY. To prevent constipation: You may use over-the-counter stool softener(s) such as Colace (over the counter) 100 mg by mouth twice a day and/or Miralax (over the counter) for constipation as needed.  Drink plenty of fluids (prune juice may be helpful) and high fiber foods.  Itching:  If you experience itching with your medications, try taking only a single pain pill, or even half a pain pill at a time.  You can also use  benadryl over the counter for itching or also to help with sleep.  Precautions:  If you experience chest pain or shortness of breath - call 911 immediately for transfer to the hospital emergency department!! Medications: Please contact the clinic during office hours (Monday through Friday, 0800 to 1600) if you need a refill on any medications.  Please monitor medications and allow 24 to 48 hours to process refill request!!!!  Please note that only medications directly related to the surgery can be prescribed.  For other medications (e.g. blood pressure medicines, sleeping medicines, etc.), please contact the prescribing physician or your primary care provider.  If you develop a fever greater that 101.1 deg F, purulent drainage from wound, increased redness or drainage from wound, or calf pain -- Call the office at [phone].   Information on my medicine - ELIQUIS (apixaban)  This medication education was reviewed with me or my healthcare representative as part of my discharge preparation.   Why was Eliquis prescribed for you? Eliquis was prescribed to treat blood clots that may have been found in the veins of your legs (deep vein thrombosis) or in your lungs (pulmonary embolism) and to reduce the risk of them occurring again.  What do You need to know about Eliquis ? The starting dose is 10 mg (two 5 mg tablets) taken TWICE daily for the FIRST SEVEN (7) DAYS, then on  05/13/2022 AM dose,  the dose is reduced to ONE 5 mg tablet taken TWICE daily.  Eliquis may be taken with  or without food.   Try to take the dose about the same time in the morning and in the evening. If you have difficulty swallowing the tablet whole please discuss with your pharmacist how to take the medication safely.  Take Eliquis exactly as prescribed and DO NOT stop taking Eliquis without talking to the doctor who prescribed the medication.  Stopping may increase your risk of developing a new blood clot.  Refill your  prescription before you run out.  After discharge, you should have regular check-up appointments with your healthcare provider that is prescribing your Eliquis.    What do you do if you miss a dose? If a dose of ELIQUIS is not taken at the scheduled time, take it as soon as possible on the same day and twice-daily administration should be resumed. The dose should not be doubled to make up for a missed dose.  Important Safety Information A possible side effect of Eliquis is bleeding. You should call your healthcare provider right away if you experience any of the following: Bleeding from an injury or your nose that does not stop. Unusual colored urine (red or dark brown) or unusual colored stools (red or black). Unusual bruising for unknown reasons. A serious fall or if you hit your head (even if there is no bleeding).  Some medicines may interact with Eliquis and might increase your risk of bleeding or clotting while on Eliquis. To help avoid this, consult your healthcare provider or pharmacist prior to using any new prescription or non-prescription medications, including herbals, vitamins, non-steroidal anti-inflammatory drugs (NSAIDs) and supplements.  This website has more information on Eliquis (apixaban): http://www.eliquis.com/eliquis/home

## 2022-04-26 NOTE — Plan of Care (Signed)
0700: Pt resting bed at beginning of shift, pt alert and oriented x3, but forgetful at times, pt on room air, pt bedrest due to Left hip fracture, pt pending surgery today, pt NWB to LLE. Pt denies pain at this time, purewick placed on pt, pt bed alarm in place, pt bed in lowest position and call bell within reach. Pt daughter came to bedside. Pt NPO for procedure, pt made aware.

## 2022-04-26 NOTE — Anesthesia Preprocedure Evaluation (Addendum)
Anesthesia Evaluation  Patient identified by MRN, date of birth, ID band  Reviewed: Allergy & Precautions, NPO status , Patient's Chart, lab work & pertinent test results  History of Anesthesia Complications Negative for: history of anesthetic complications  Airway Mallampati: II  TM Distance: >3 FB Neck ROM: Full    Dental  (+) Dental Advisory Given   Pulmonary neg pulmonary ROS   breath sounds clear to auscultation       Cardiovascular hypertension, Pt. on medications  Rhythm:Regular     Neuro/Psych  Neuromuscular disease  negative psych ROS   GI/Hepatic Neg liver ROS,GERD  Medicated and Controlled,,  Endo/Other  diabetes    Renal/GU CRFRenal disease     Musculoskeletal  (+) Arthritis ,    Abdominal   Peds  Hematology  (+) Blood dyscrasia, anemia Lab Results      Component                Value               Date                      WBC                      7.4                 04/26/2022                HGB                      11.8 (L)            04/26/2022                HCT                      34.7 (L)            04/26/2022                MCV                      89.0                04/26/2022                PLT                      158                 04/26/2022             DENIES blood thinners   Anesthesia Other Findings   Reproductive/Obstetrics                             Anesthesia Physical Anesthesia Plan  ASA: 3  Anesthesia Plan: MAC and Spinal   Post-op Pain Management:    Induction: Intravenous  PONV Risk Score and Plan: 2 and Propofol infusion and Treatment may vary due to age or medical condition  Airway Management Planned: Nasal Cannula, Natural Airway and Simple Face Mask  Additional Equipment: None  Intra-op Plan:   Post-operative Plan:   Informed Consent: I have reviewed the patients History and Physical, chart, labs and discussed the procedure  including the risks, benefits and alternatives for the proposed anesthesia with the  patient or authorized representative who has indicated his/her understanding and acceptance.     Dental advisory given  Plan Discussed with: CRNA  Anesthesia Plan Comments:        Anesthesia Quick Evaluation

## 2022-04-26 NOTE — Anesthesia Procedure Notes (Signed)
Procedure Name: MAC Date/Time: 04/26/2022 11:30 AM  Performed by: Darletta Moll, CRNAPre-anesthesia Checklist: Patient identified, Emergency Drugs available, Suction available and Patient being monitored Patient Re-evaluated:Patient Re-evaluated prior to induction Oxygen Delivery Method: Nasal cannula

## 2022-04-26 NOTE — Transfer of Care (Signed)
Immediate Anesthesia Transfer of Care Note  Patient: Brandy Gonzales  Procedure(s) Performed: INTRAMEDULLARY (IM) NAIL INTERTROCHANTERIC (Left)  Patient Location: PACU  Anesthesia Type:Spinal  Level of Consciousness: drowsy and patient cooperative  Airway & Oxygen Therapy: Patient Spontanous Breathing and Patient connected to nasal cannula oxygen  Post-op Assessment: Report given to RN, Post -op Vital signs reviewed and stable, and Patient moving all extremities X 4  Post vital signs: Reviewed and stable  Last Vitals:  Vitals Value Taken Time  BP    Temp    Pulse 70 04/26/22 1339  Resp 14 04/26/22 1339  SpO2 97 % 04/26/22 1339  Vitals shown include unvalidated device data.  Last Pain:  Vitals:   04/26/22 1332  TempSrc:   PainSc: 0-No pain         Complications: No notable events documented.

## 2022-04-26 NOTE — Progress Notes (Signed)
PROGRESS NOTE    Brandy Gonzales  WVP:[id number] DOB: [birth year] DOA: 04/25/2022 PCP: Cassandria Anger, MD  Outpatient Specialists:     Brief Narrative:  As per H&P done on admission: "Brandy Gonzales is an 81 y.o. female with medical history significant for hypertension, type 2 diabetes mellitus, and CKD 3B who presents to the emergency department with severe left hip pain after a fall at home.   Patient's granddaughter is at the bedside and assists with the history.  The patient had been in her usual state of health and was looking after a great grandchild when she fell.  She has been unable to bear weight on the left leg and is complaining of severe left hip pain.   She denies any history of heart or lung disease, is active around the house looking after great-grandchildren, sometimes uses a walker, but never complains of chest discomfort with activity.  She is a non-smoker.  No recent fever, chills, chest pain, or shortness of breath.   Lower left leg wound has been healing well per family.   ED Course: Upon arrival to the ED, patient is found to be afebrile and saturating well on room air with elevated blood pressures.  EKG demonstrates sinus rhythm with repolarization abnormality.  Chest x-ray negative for acute cardiopulmonary disease.  Plain radiographs of the pelvis demonstrate comminuted left femoral intertrochanteric fracture.  Blood work notable for glucose 244 and creatinine 1.46.   Orthopedic surgery was consulted by the ED physician and the patient was treated with morphine in the ED".  06/27/2021: Patient seen.  Patient has undergone left hip surgery (open treatment left hip peritrochanteric fracture with long cephalomedullary nail).  Orthopedic team is evaluating postop management.     Assessment & Plan:   Principal Problem:   Closed left hip fracture, initial encounter [facility]) Active Problems:   Controlled type 2 diabetes mellitus with diabetic nephropathy (HCC)    Essential hypertension   Stage 3b chronic kidney disease (CKD) ([place])    1. Left hip fracture  -Patient has undergone surgery. -See above documentation. -Orthopedic teams input is highly appreciated.     2. Hypertension  -Optimize pain control. -Control blood pressure cautiously.     3. Type II DM  - A1c was 7.8% in December 2023   -Sliding scale insulin coverage.     4. CKD IIIb  -Stable. -SCr is 1.46 on admission, appears close to baseline  -Continue to monitor renal function and electrolytes. -Avoid nephrotoxin. -Avoid hemodynamic instability. -Keep MAP greater than 65 mmHg.   -Follow-up with nephrology on discharge.  DVT prophylaxis: SCD Code Status: Full code Family Communication:  Disposition Plan: This will depend on hospital course   Consultants:  Orthopedic surgery  Procedures:  Left hip surgery  Antimicrobials:  IV cefazolin   Subjective: No new complaints  Objective: Vitals:   04/26/22 1332 04/26/22 1345 04/26/22 1400 04/26/22 1434  BP: 98/61 116/67 136/70 135/74  Pulse: 73 61 62 (!) 54  Resp: 15 12 11 16   Temp: 97.6 F (36.4 C)  97.6 F (36.4 C) (!) 97.4 F (36.3 C)  TempSrc:    Oral  SpO2: 95% 97% 98% 100%  Weight:        Intake/Output Summary (Last 24 hours) at 04/26/2022 1623 Last data filed at 04/26/2022 1328 Gross per 24 hour  Intake 800 ml  Output 150 ml  Net 650 ml   Filed Weights   04/25/22 2340  Weight: 51.9 kg  Examination:  General exam: Appears calm and comfortable  Respiratory system: Clear to auscultation.  Cardiovascular system: S1 & S2 heard. Gastrointestinal system: Abdomen is soft and nontender Central nervous system: Alert and oriented. No focal neurological deficits. Extremities: No lower extremity edema.  Data Reviewed: I have personally reviewed following labs and imaging studies  CBC: Recent Labs  Lab 04/25/22 1857 04/26/22 0136  WBC 6.6 7.4  NEUTROABS 5.2  --   HGB 12.1 11.8*  HCT 36.8  34.7*  MCV 91.1 89.0  PLT 173 034   Basic Metabolic Panel: Recent Labs  Lab 04/22/22 1440 04/25/22 1857 04/26/22 0234  NA 144 143 143  K 4.0 3.6 3.7  CL 107 109 110  CO2 30 27 23   GLUCOSE 165* 244* 165*  BUN 15 14 12   CREATININE 1.36* 1.46* 1.25*  CALCIUM 10.2 9.7 9.7   GFR: Estimated Creatinine Clearance: 25.6 mL/min (A) (by C-G formula based on SCr of 1.25 mg/dL (H)). Liver Function Tests: Recent Labs  Lab 04/22/22 1440 04/25/22 1857  AST 29 30  ALT 29 31  ALKPHOS 128* 117  BILITOT 1.5* 1.1  PROT 6.5 5.8*  ALBUMIN 4.0 3.5   No results for input(s): "LIPASE", "AMYLASE" in the last 168 hours. No results for input(s): "AMMONIA" in the last 168 hours. Coagulation Profile: No results for input(s): "INR", "PROTIME" in the last 168 hours. Cardiac Enzymes: No results for input(s): "CKTOTAL", "CKMB", "CKMBINDEX", "TROPONINI" in the last 168 hours. BNP (last 3 results) No results for input(s): "PROBNP" in the last 8760 hours. HbA1C: No results for input(s): "HGBA1C" in the last 72 hours. CBG: Recent Labs  Lab 04/26/22 0401 04/26/22 0806 04/26/22 1002 04/26/22 1334 04/26/22 1432  GLUCAP 159* 135* 142* 153* 134*   Lipid Profile: No results for input(s): "CHOL", "HDL", "LDLCALC", "TRIG", "CHOLHDL", "LDLDIRECT" in the last 72 hours. Thyroid Function Tests: No results for input(s): "TSH", "T4TOTAL", "FREET4", "T3FREE", "THYROIDAB" in the last 72 hours. Anemia Panel: No results for input(s): "VITAMINB12", "FOLATE", "FERRITIN", "TIBC", "IRON", "RETICCTPCT" in the last 72 hours. Urine analysis:    Component Value Date/Time   COLORURINE YELLOW 11/16/2021 1330   APPEARANCEUR HAZY (A) 11/16/2021 1330   LABSPEC 1.012 11/16/2021 1330   PHURINE 5.0 11/16/2021 1330   GLUCOSEU >=500 (A) 11/16/2021 1330   GLUCOSEU NEGATIVE 10/21/2021 1404   HGBUR NEGATIVE 11/16/2021 1330   BILIRUBINUR NEGATIVE 11/16/2021 1330   KETONESUR NEGATIVE 11/16/2021 1330   PROTEINUR NEGATIVE  11/16/2021 1330   UROBILINOGEN 0.2 10/21/2021 1404   NITRITE NEGATIVE 11/16/2021 1330   LEUKOCYTESUR MODERATE (A) 11/16/2021 1330   Sepsis Labs: @LABRCNTIP (procalcitonin:4,lacticidven:4)  ) Recent Results (from the past 240 hour(s))  Surgical PCR screen     Status: None   Collection Time: 04/25/22 11:56 PM   Specimen: Nasal Mucosa; Nasal Swab  Result Value Ref Range Status   MRSA, PCR NEGATIVE NEGATIVE Final   Staphylococcus aureus NEGATIVE NEGATIVE Final    Comment: (NOTE) The Xpert SA Assay (FDA approved for NASAL specimens in patients 57 years of age and older), is one component of a comprehensive surveillance program. It is not intended to diagnose infection nor to guide or monitor treatment. Performed at [facility], [place] [address], [place], [place] [address]          Radiology Studies: DG FEMUR MIN 2 VIEWS LEFT  Result Date: 04/26/2022 CLINICAL DATA:  Left hip ORIF EXAM: LEFT FEMUR 2 VIEWS COMPARISON:  04/25/2022 FINDINGS: Interval postsurgical changes from ORIF of intertrochanteric left  femur fracture with long intramedullary rod, proximal lag screw, and distal interlocking screws. Anatomic alignment at the fracture site. No new fractures. No malalignment. Expected postoperative changes within the overlying soft tissues. IMPRESSION: Interval ORIF of intertrochanteric left femur fracture. No postoperative complications are evident. Electronically Signed   By: Davina Poke D.O.   On: 04/26/2022 14:11   DG FEMUR MIN 2 VIEWS LEFT  Result Date: 04/26/2022 CLINICAL DATA:  Left hip surgery EXAM: LEFT FEMUR 2 VIEWS COMPARISON:  04/25/2022 FINDINGS: Six C-arm fluoroscopic images were obtained intraoperatively and submitted for post operative interpretation. Images demonstrate placement of ORIF hardware traversing proximal left femur fracture with improved alignment. 105 seconds fluoroscopy time utilized. Radiation dose: 6.36 mGy. Please see the performing provider's  procedural report for further detail. IMPRESSION: Intraoperative fluoroscopy provided for left hip ORIF. Electronically Signed   By: Davina Poke D.O.   On: 04/26/2022 13:31   DG C-Arm 1-60 Min-No Report  Result Date: 04/26/2022 Fluoroscopy was utilized by the requesting physician.  No radiographic interpretation.   DG Femur Min 2 Views Left  Result Date: 04/25/2022 CLINICAL DATA:  Left hip fracture. EXAM: LEFT FEMUR 2 VIEWS COMPARISON:  Earlier same day hip radiograph at 1915 hours FINDINGS: Comminuted intertrochanteric fracture, better visualized on earlier same day radiograph secondary to overlying soft tissues. No other acute fracture of the left femur. Partially visualized intramedullary rod with proximal locking screws in the tibia. Small ossification noted adjacent to the medial femoral condyle, likely sequela from prior trauma. No suprapatellar knee joint effusion. Vascular calcifications are seen in the left thigh and left lower leg. IMPRESSION: Acute comminuted intertrochanteric fracture, better visualized on earlier same day hip radiograph due to overlying soft tissues. No other acute fractures of the left femur. Electronically Signed   By: Ileana Roup M.D.   On: 04/25/2022 20:38   DG Chest 1 View  Result Date: 04/25/2022 CLINICAL DATA:  Fall EXAM: CHEST  1 VIEW COMPARISON:  Chest x-ray 09/27/2021 FINDINGS: The heart size and mediastinal contours are within normal limits. Both lungs are clear. The visualized skeletal structures are unremarkable. IMPRESSION: No active disease. Electronically Signed   By: Ronney Asters M.D.   On: 04/25/2022 19:49   DG Hip Unilat W or Wo Pelvis 2-3 Views Left  Result Date: 04/25/2022 CLINICAL DATA:  Hip pain, fall. EXAM: DG HIP (WITH OR WITHOUT PELVIS) 2-3V LEFT COMPARISON:  None. FINDINGS: There is a comminuted left femoral intratrochanteric fracture with apex lateral angulation and overlying soft tissue swelling. There is no dislocation identified.  IMPRESSION: Comminuted left femoral intratrochanteric fracture. Electronically Signed   By: Ronney Asters M.D.   On: 04/25/2022 19:44        Scheduled Meds:  acetaminophen  500 mg Oral Q6H   carvedilol  25 mg Oral BID WC   docusate sodium  100 mg Oral BID   [START ON 04/27/2022] enoxaparin (LOVENOX) injection  40 mg Subcutaneous Q24H   insulin aspart  0-6 Units Subcutaneous Q4H   mupirocin ointment  1 Application Nasal BID   Continuous Infusions:   ceFAZolin (ANCEF) IV       LOS: 1 day    Time spent: 55 minutes.    Dana Allan, MD  [facility] Pager #: [phone] 7PM-7AM contact night coverage as above

## 2022-04-26 NOTE — Op Note (Signed)
OPERATIVE NOTE  Brandy Gonzales female 81 y.o. 04/26/2022  PREOPERATIVE DIAGNOSIS: Left hip peritrochanteric fracture Osteoporosis with current left hip peritrochanteric insufficiency fracture  POSTOPERATIVE DIAGNOSIS: Left hip peritrochanteric fracture (J94.174Y) Osteoporosis with current left hip peritrochanteric insufficiency fracture (M80.052A)  PROCEDURE(S): Open treatment left hip peritrochanteric fracture with long cephalomedullary nail (81448) Operative use of fluoroscopy for above procedure(s) (76000)   SURGEON: Georgeanna Harrison, M.D.  ASSISTANT(S): None  ANESTHESIA: Choice  FINDINGS: Preoperative Examination: Left Lower Extremity: Inspection: Slightly shortened, flexed and externally rotated Palpation: Tender to palpation at hip site and fracture ROM: Range of motion severely limited due to pain Strength: +DF/PF/EHL Sensation: SILT SP/DP/T Skin: Intact Peripheral Vascular: 2+ DP, WWP  Operative Findings: Comminuted left hip peritrochanteric fracture redemonstrated on operative fluoroscopy.  Closed reduction achieved with traction and ligamentotaxis on Hana table.  Duction confirmed on orthogonal AP and lateral fluoroscopic views.  Appropriate placement of a long cephalomedullary nail with 2 distal interlock screws and proximal lag screw, confirmed on orthogonal AP and lateral fluoroscopic views with stable maintenance of reduction.  IMPLANTS: Implant Name Type Inv. Item Serial No. Manufacturer Lot No. LRB No. Used Action  NAIL TFNA CANN [id number] LEFT - [id number] Nail NAIL TFNA CANN [id number] LEFT  [employer] [id number] Left 1 Implanted  SCREW TFNA FENS 10.35X80 - [id number] Screw SCREW TFNA FENS 10.35X80  [employer]  Left 1 Implanted  SCREW LOCK IM 5X36 - [id number] Screw SCREW LOCK IM 5X36  [employer]  Left 1 Implanted  SCREW LOCK IM [id number] - [id number] Screw SCREW LOCK IM [id number]  [employer] [id number] Left 1 Implanted     INDICATIONS:  The patient is a 81 y.o. female who sustained mechanical fall resulting in a left hip peritrochanteric fracture.  Due to the morbidity associated with nonoperative treatment, she did wish to proceed with operative treatment form of cephalomedullary nail fixation in order to promote early return to mobility.  She understood the risks, benefits and alternatives to surgery which include but are not limited to bleeding, wound healing complications, infection, damage to surrounding structures, persistent pain, stiffness, lack of improvement, potential for subsequent arthritis or worsening of pre-existing arthritis, nonunion, malunion, and need for further surgery, as well as complications related to anesthesia, cardiovascular complications, and death.  She also understood the potential for continued pain, and that there were no guarantees of acceptable outcome.  After weighing these risks the patient opted to proceed with surgery.  TECHNIQUE: Patient was identified in the preoperative holding area.  The left hip was confirmed as the appropriate operative site and marked by me.  Consent was signed by myself and the patient and witnessed by the preoperative nurse.  No block was performed by anesthesia in the preoperative holding area.  Patient was taken to the operative suite and placed supine on the operative table.  Anesthesia was induced by the anesthesia team.  The patient was positioned appropriately for the procedure and all bony prominences were well padded.  A tourniquet was not used.  Preoperative antibiotics were given. The extremity was prepped and draped in the usual sterile fashion and surgical timeout was performed.  Surgery was performed on the Hana table.  Fracture was closed reduced with traction and ligamentotaxis.  Reduction was confirmed on orthogonal AP and lateral fluoroscopic views.  Surface anatomy was marked out laterally.  The location for the incision was identified  fluoroscopically.  A short curvilinear incision proximal to the greater trochanter was marked out on the skin.  Skin  was incised sharply.  Underlying subcutaneous tissue and fat was dissected with Bovie electrocautery down to the fascial layer.  Fascia was split sharply in line with fibers.  Underlying muscle was split bluntly in line with fibers, exposing the trochanteric starting point.  Starting wire was positioned at the trochanteric starting point under fluoroscopic guidance and advanced into the center center position of the proximal intramedullary canal of the femur, confirming appropriate position fluoroscopically.  The proximal femur was then cannulated with the entry reamer.  The starting wire and entry reamer were withdrawn.  A ball-tipped guidewire was impacted into a center center position distally to the level of the knee down the intramedullary canal of the femur under fluoroscopic guidance.  A measurement for the nail was obtained, and a 360 mm length nail was selected.  Sequential reaming was carried out up to 11.5 mm in order to accommodate a 10.0 mm diameter nail.  This was mounted to the jig and impacted into position under fluoroscopic guidance.  The location for the lag screw incision was noted on the skin.  Skin was incised sharply.  Underlying subcutaneous tissue, fascia, and muscle was split sharply with a deep knife giving access to the lateral femoral cortex.  Triple sleeve trocar was advanced onto the lateral cortex of the femur.  Guidewire for the lag screw was advanced proximally through the femoral neck into the femoral head under fluoroscopic guidance, taking care to avoid subchondral penetration.  A derotation wire was placed through the jig.  Drill path for the lag screw was drilled over the guidewire.  80 mm lag screw was placed.  The derotation wire was removed.  The construct was compressed and then statically locked, and the jig assembly was removed.  Location of the distal  interlock bolts was identified fluoroscopically.  Skin was incised sharply.  Underlying subcutaneous tissue, fascia, and muscle split sharply with a deep knife giving access to the lateral femoral cortex.  2 distal interlock screws were placed through the nail using perfect circles technique.  Final x-rays demonstrated reduction of the fracture with appropriate placement of long cephalomedullary nail including proximal lag screw and 2 distal interlock screws.  Wounds were copiously irrigated and hemostasis was obtained.  1 g vancomycin powder was placed deep in the wound.  Fascia layer was closed with interrupted figure-of-eight #1 PDS.  Deep fat layers were closed with simple inverted interrupted #0 Vicryl, followed by similar erupted #2-0 Monocryl deep dermal, followed by running #3 Monocryl subcuticular.  Skin was sealed with Dermabond.  Suture tails secured with Steri-Strips.  Aquacel dressings were placed over the wounds.  Patient was awakened from anesthesia and transferred to PACU in stable condition.  She tolerated procedure well.  No complications were noted intraoperatively.  POSTOPERATIVE INSTRUCTIONS: Mobility: Out of bed with PT/OT Pain control: Continue to wean/titrate to appropriate oral regimen DVT Prophylaxis: Lovenox 40 mg daily x 6 weeks postoperatively Further surgical plans: None LLE: Weightbearing as tolerated, no restrictions Dressing care: Keep AQUACEL on and dry for up to 14 days.  Do not allow surgical area to get wet before that.  Remove AQUACEL dressing after 14 days and allow area to get wet in shower but DO NOT SUBMERGE until wound is evaluated in clinic.  In most cases skin glue is used and no additional dressing is necessary.  Disposition: Per primary team is medically appropriate Follow-up: Please call [facility] [phone]) to schedule follow-op appointment for 2 weeks after surgery.  TOURNIQUET TIME: * No tourniquets in log *  BLOOD  LOSS: 150 mL         DRAINS: None         SPECIMEN: None       COMPLICATIONS:  * No complications entered in OR log *         DISPOSITION: PACU - hemodynamically stable.         CONDITION: stable   Georgeanna Harrison M.D. Orthopaedic Surgery [facility]   Portions of the record have been created with voice recognition software.  Grammatical and punctuation errors, random word insertions, wrong-word or "sound-a-like" substitutions, pronoun errors (inaccuracies and/or substitutions), and/or incomplete sentences may have occurred due to the inherent limitations of voice recognition software.  Not all errors are caught or corrected.  Although every attempt is made to root out erroneous and incomplete transcription, the note may still not fully represent the intent or opinion of the author.  Read the chart carefully and recognize, using context, where errors/substitutions have occurred.  Any questions or concerns about the content of this note or information contained within the body of this dictation should be addressed directly with the author for clarification.

## 2022-04-26 NOTE — Progress Notes (Signed)
Orthopedic Tech Progress Note Patient Details:  Brandy Gonzales [birth year] [id number]  Patient ID: [name], female   DOB: 1940-08-07, 81 y.o.   MRN: [id number] Patient doesn't meet criteria for an ohf. No one over 70 can have an ohf. Karolee Stamps 04/26/2022, 4:24 AM
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
## 2022-04-27 ENCOUNTER — Inpatient Hospital Stay (HOSPITAL_COMMUNITY): Payer: Medicare Other

## 2022-04-27 DIAGNOSIS — R4182 Altered mental status, unspecified: Secondary | ICD-10-CM

## 2022-04-27 DIAGNOSIS — S72002A Fracture of unspecified part of neck of left femur, initial encounter for closed fracture: Secondary | ICD-10-CM | POA: Diagnosis not present

## 2022-04-27 LAB — VITAMIN B12: Vitamin B-12: 208 pg/mL (ref 180–914)

## 2022-04-27 LAB — BASIC METABOLIC PANEL
Anion gap: 8 (ref 5–15)
BUN: 16 mg/dL (ref 8–23)
CO2: 23 mmol/L (ref 22–32)
Calcium: 9.1 mg/dL (ref 8.9–10.3)
Chloride: 107 mmol/L (ref 98–111)
Creatinine, Ser: 1.48 mg/dL — ABNORMAL HIGH (ref 0.44–1.00)
GFR, Estimated: 35 mL/min — ABNORMAL LOW (ref >60)
Glucose, Bld: 142 mg/dL — ABNORMAL HIGH (ref 70–99)
Potassium: 3.6 mmol/L (ref 3.5–5.1)
Sodium: 138 mmol/L (ref 135–145)

## 2022-04-27 LAB — GLUCOSE, CAPILLARY
Glucose-Capillary: 140 mg/dL — ABNORMAL HIGH (ref 70–99)
Glucose-Capillary: 168 mg/dL — ABNORMAL HIGH (ref 70–99)
Glucose-Capillary: 183 mg/dL — ABNORMAL HIGH (ref 70–99)
Glucose-Capillary: 204 mg/dL — ABNORMAL HIGH (ref 70–99)
Glucose-Capillary: 174 mg/dL — ABNORMAL HIGH (ref 70–99)
Glucose-Capillary: 234 mg/dL — ABNORMAL HIGH (ref 70–99)

## 2022-04-27 LAB — CBC
HCT: 29.4 % — ABNORMAL LOW (ref 36.0–46.0)
Hemoglobin: 10.2 g/dL — ABNORMAL LOW (ref 12.0–15.0)
MCH: 30.9 pg (ref 26.0–34.0)
MCHC: 34.7 g/dL (ref 30.0–36.0)
MCV: 89.1 fL (ref 80.0–100.0)
Platelets: 126 10*3/uL — ABNORMAL LOW (ref 150–400)
RBC: 3.3 MIL/uL — ABNORMAL LOW (ref 3.87–5.11)
RDW: 14.5 % (ref 11.5–15.5)
WBC: 9.8 10*3/uL (ref 4.0–10.5)
nRBC: 1.1 % — ABNORMAL HIGH (ref 0.0–0.2)

## 2022-04-27 LAB — FOLATE: Folate: 5.9 ng/mL — ABNORMAL LOW (ref >5.9)

## 2022-04-27 LAB — TSH: TSH: 1.46 u[IU]/mL (ref 0.350–4.500)

## 2022-04-27 MED ORDER — VITAMIN B-12 1000 MCG PO TABS
1000.0000 ug | ORAL_TABLET | Freq: Every day | ORAL | Status: DC
  Administered 2022-05-02 – 2022-05-06 (×5): 1000 ug via ORAL
  Filled 2022-04-27 (×5): qty 1

## 2022-04-27 MED ORDER — FOLIC ACID 5 MG/ML IJ SOLN
1.0000 mg | Freq: Every day | INTRAMUSCULAR | Status: DC
  Administered 2022-05-03 (×2): 1 mg via INTRAVENOUS
  Filled 2022-04-27 (×10): qty 0.2

## 2022-04-27 MED ORDER — CYANOCOBALAMIN 1000 MCG/ML IJ SOLN
1000.0000 ug | Freq: Every day | INTRAMUSCULAR | Status: AC
  Administered 2022-04-27 – 2022-05-01 (×5): 1000 ug via INTRAMUSCULAR
  Filled 2022-04-27 (×6): qty 1

## 2022-04-27 MED ORDER — FENTANYL CITRATE PF 50 MCG/ML IJ SOSY
25.0000 ug | PREFILLED_SYRINGE | Freq: Once | INTRAMUSCULAR | Status: AC
  Administered 2022-04-27: 25 ug via INTRAVENOUS

## 2022-04-27 MED ORDER — THIAMINE HCL 100 MG/ML IJ SOLN
100.0000 mg | Freq: Every day | INTRAMUSCULAR | Status: DC
  Administered 2022-04-27: 100 mg via INTRAVENOUS
  Filled 2022-04-27 (×3): qty 2

## 2022-04-27 MED ORDER — FENTANYL CITRATE (PF) 100 MCG/2ML IJ SOLN
INTRAMUSCULAR | Status: AC
  Filled 2022-04-27: qty 2

## 2022-04-27 MED ORDER — IOHEXOL 350 MG/ML SOLN
60.0000 mL | Freq: Once | INTRAVENOUS | Status: AC | PRN
  Administered 2022-04-27: 60 mL via INTRAVENOUS

## 2022-04-27 MED ORDER — THIAMINE MONONITRATE 100 MG PO TABS
100.0000 mg | ORAL_TABLET | Freq: Every day | ORAL | Status: DC
  Administered 2022-04-28 – 2022-05-06 (×8): 100 mg via ORAL
  Filled 2022-04-27 (×11): qty 1

## 2022-04-27 MED ORDER — FOLIC ACID 1 MG PO TABS
1.0000 mg | ORAL_TABLET | Freq: Every day | ORAL | Status: DC
  Administered 2022-04-27 – 2022-05-06 (×9): 1 mg via ORAL
  Filled 2022-04-27 (×10): qty 1

## 2022-04-27 MED ORDER — SODIUM CHLORIDE 0.9 % IV SOLN
INTRAVENOUS | Status: DC
  Administered 2022-04-28: 1000 mL via INTRAVENOUS

## 2022-04-27 NOTE — Progress Notes (Signed)
EEG complete - results pending 

## 2022-04-27 NOTE — Progress Notes (Signed)
Patient had a syncope episode while working with PT. Patient sat at the edge of the bed and felt light headed and dizzy. PT staff laid patient down in the bed and alerted the staff that patient was unresponsive. Sternal rub applied and patient returned back to consciousness. Viral signs assessed and BP was 121/66 HR 64 O2sats 100% on R.A Blood sugar was 183. Patient returned back to baseline, alert and oriented x4 and answering questions appropriately. Dr Shawna Clamp notified and made aware of patient change in condition, and order to continue to monitor patient, if patient c/o of any headaches he will order a CT of the head. Patient not c/o of any headaches at the moment. Patient in the bed with call light within reach, bed placed in lowest position. Son at bedside. Staff will continue the plan of care for patient.

## 2022-04-27 NOTE — Progress Notes (Signed)
Initial Nutrition Assessment  DOCUMENTATION CODES:   Severe malnutrition in context of chronic illness  INTERVENTION:  - Advance diet as tolerated.   NUTRITION DIAGNOSIS:   Severe Malnutrition related to chronic illness as evidenced by energy intake < or equal to 75% for > or equal to 1 month, percent weight loss (12% wt loss in 3 months).  GOAL:   Patient will meet greater than or equal to 90% of their needs  MONITOR:   PO intake, Diet advancement  REASON FOR ASSESSMENT:   Malnutrition Screening Tool    ASSESSMENT:   81 y.o. female admits related to fall and left hip pain. PMH includes: HTN, T2DM. CKD stage 3. Pt is currently receiving medical management for closed left hip fracture.  Meds reviewed: colace, sliding scale insulin. Labs reviewed.   Pt was out of room at time of assessment in CT in setting of code stroke. Pt's son was in the room. He reports that his Mother's appetite was declining PTA and was not eating well. Per record, pt has experienced a 12% wt loss in 3 months. Pt is currently NPO. RD will continue to monitor for diet advancement and add supplements when diet is advanced.   If diet is unable to be advanced within 24-48 hrs, consider alternate means of nutrition.   NUTRITION - FOCUSED PHYSICAL EXAM:  Unable to assess,  Diet Order:   Diet Order             Diet NPO time specified  Diet effective now                   EDUCATION NEEDS:   Not appropriate for education at this time  Skin:  Skin Assessment: Skin Integrity Issues: Skin Integrity Issues:: Incisions Incisions: left hip  Last BM:  unknown  Height:   Ht Readings from Last 1 Encounters:  04/22/22 4\' 11"  (1.499 m)    Weight:   Wt Readings from Last 1 Encounters:  04/25/22 51.9 kg    Ideal Body Weight:     BMI:  Body mass index is 23.11 kg/m.  Estimated Nutritional Needs:   Kcal:  1555-1815 kcals  Protein:  75-90 gm  Fluid:  >/= 1.5 L  Thalia Bloodgood, RD,  LDN, CNSC.
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
## 2022-04-27 NOTE — Procedures (Signed)
Patient Name: Brandy Gonzales  MRN: [id number]  Epilepsy Attending: Lora Havens  Referring Physician/Provider: Donnetta Simpers, MD  Date: 04/27/2022 Duration: 22.03 mins  Patient history: 81 year old female with presyncopal episode this morning now with aphasia.  EEG to evaluate for seizure.  Level of alertness: Awake  AEDs during EEG study: None  Technical aspects: This EEG study was done with scalp electrodes positioned according to the 10-20 International system of electrode placement. Electrical activity was reviewed with band pass filter of 1-70Hz , sensitivity of 7 uV/mm, display speed of 69mm/sec with a 60Hz  notched filter applied as appropriate. EEG data were recorded continuously and digitally stored.  Video monitoring was available and reviewed as appropriate.  Description: No clear posterior dominant rhythm was seen.  EEG showed continuous generalized predominantly 5 to 7 Hz theta slowing admixed with intermittent generalized 2 to 3 Hz delta slowing. Hyperventilation and photic stimulation were not performed.     ABNORMALITY - Continuous slow, generalized  IMPRESSION: This study is suggestive of moderate diffuse encephalopathy, nonspecific etiology. No seizures or epileptiform discharges were seen throughout the recording.  Casia Corti Barbra Sarks

## 2022-04-27 NOTE — Progress Notes (Addendum)
Orthopaedics Daily Progress Note   04/27/2022   8:56 AM  Brandy Gonzales is a 81 y.o. female 1 Day Post-Op s/p INTRAMEDULLARY (IM) NAIL INTERTROCHANTERIC  Subjective Pain well controlled.  Denies nausea, vomiting, or fevers. PT apparently cancelled due to "Other (comment) (await weight bearing status)", despite "Progressive Mobility Protocol: No Restrictions ... Weight-bearing: As tolerated (WBAT)" having been ordered 04/26/2022 at 1451, as well as "Out of bed with PT/OT" and "LLE: Weightbearing as tolerated, no restrictions" specified in op note.  Objective Vitals:   04/26/22 1932 04/27/22 0404  BP: (!) 157/66 128/62  Pulse: 69 63  Resp: 18 17  Temp: 98 F (36.7 C) 97.8 F (36.6 C)  SpO2: 100% 98%    Intake/Output Summary (Last 24 hours) at 04/27/2022 0856 Last data filed at 04/27/2022 0400 Gross per 24 hour  Intake 960 ml  Output 350 ml  Net 610 ml    Physical Exam LLE: Dressing clean, dry, and intact +DF/PF/EHL SILT SP/DP/T +DP/PT and WWP distally  Assessment 81 y.o. female s/p Procedure(s) (LRB): INTRAMEDULLARY (IM) NAIL INTERTROCHANTERIC (Left)  Plan Mobility: Out of bed with PT/OT PT apparently cancelled 04/27/2022 AM due to "Other (comment) (await weight bearing status)", despite "Progressive Mobility Protocol: No Restrictions ... Weight-bearing: As tolerated (WBAT)" having been ordered 04/26/2022 at 1451, as well as "Out of bed with PT/OT" and "LLE: Weightbearing as tolerated, no restrictions" specified in op note POSTOPERATIVE INSTRUCTIONS. Please allow pt to get OOB and work with PT/OT as ordered Please perform PT/OT as ordered Pain control: Continue to wean/titrate to appropriate oral regimen DVT Prophylaxis: Lovenox 40 mg daily x 6 weeks postoperatively Further surgical plans: None LLE: Weightbearing as tolerated, no restrictions Dressing care: Keep AQUACEL on and dry for up to 14 days.  Do not allow surgical area to get wet before that.  Remove AQUACEL  dressing after 14 days and allow area to get wet in shower but DO NOT SUBMERGE until wound is evaluated in clinic.  In most cases skin glue is used and no additional dressing is necessary.  Disposition: Per primary team is medically appropriate Follow-up: Please call [facility] [phone]) to schedule follow-op appointment for 2 weeks after surgery.  I have verified that my discharge instructions and follow-up information have been entered in the Discharge Navigator in Epic.  These should automatically populate in the AVS.  Please print the AVS in its entirety and ensure that the patient or a responsible party has a complete copy of the AVS before they are discharged.  If there are questions regarding discharge instructions or follow-up before the AVS is generated, please check the Discharge Navigator before attempting to contact the surgeon/office.  If unsure how to access the Discharge Navigator or the information contained in the Discharge Navigator, or how to generate/print the AVS, please contact the appropriate Nurse, learning disability.   The patient's postoperative prescriptions (e.g. pain medication, anticoagulation) have been printed, signed, and placed in/on the patient's hard chart: Lovenox and Norco    Georgeanna Harrison M.D. Orthopaedic Surgery [facility]

## 2022-04-27 NOTE — Progress Notes (Signed)
PROGRESS NOTE    Brandy Gonzales  GQQ:[id number] DOB: 1941/04/19 DOA: 04/25/2022  PCP: Cassandria Anger, MD   Brief Narrative:  This 81 years old female with PMH significant for hypertension, type 2 diabetes and CKD stage IIIb presented in the ED s/p fall at home with left hip pain.  Patient had been in her usual state of health and was looking after her grandchild when she fell.  She was unable to bear weight on her left leg and complaining about severe left hip pain.  Workup in the ED reveals comminuted left femoral intertrochanteric fracture.  Orthopedics was consulted and s/p ORIF.  Postoperative day 1.  Assessment & Plan:   Principal Problem:   Closed left hip fracture, initial encounter [facility]) Active Problems:   Controlled type 2 diabetes mellitus with diabetic nephropathy (HCC)   Essential hypertension   Stage 3b chronic kidney disease (CKD) (HCC)  Left Femoral Intertrochanteric fracture: She presented s/p fall at home.   Workup reveals left femoral intertrochanteric fracture. Orthopedic consulted, underwent ORIF, POD 1. Weightbearing as tolerated.  PT and OT eval. Adequate pain control with pain medications.  Essential hypertension: Continue Coreg 25 mg twice daily. Blood pressure is well-controlled.  Type II DM: Hemoglobin A1c 7.8 in December 2023. Continue regular insulin sliding scale.  CKD stage IIIb: Serum creatinine at baseline.  Baseline creatinine 1.2-1.4. Continue to monitor renal functions and electrolytes. Avoid nephrotoxic medications.   DVT prophylaxis: Lovenox Code Status:Full code. Family Communication:  Husband at bed side. Disposition Plan:   Status is: Inpatient Remains inpatient appropriate because: Admitted s/p mechanical fall underwent ORIF for left IT fracture. Postoperative day 1.  PT and OT pending.   Consultants:  Orthopedics  Procedures: ORIF POD 1 Antimicrobials: None  Subjective: Patient was seen and examined at bedside.   Overnight events noted. Patient reports doing much better, still reports having left thigh pain. S/p ORIF.  POD 1.  Objective: Vitals:   04/26/22 1400 04/26/22 1434 04/26/22 1932 04/27/22 0404  BP: 136/70 135/74 (!) 157/66 128/62  Pulse: 62 (!) 54 69 63  Resp: 11 16 18 17   Temp: 97.6 F (36.4 C) (!) 97.4 F (36.3 C) 98 F (36.7 C) 97.8 F (36.6 C)  TempSrc:  Oral Oral Oral  SpO2: 98% 100% 100% 98%  Weight:        Intake/Output Summary (Last 24 hours) at 04/27/2022 1036 Last data filed at 04/27/2022 0400 Gross per 24 hour  Intake 960 ml  Output 350 ml  Net 610 ml   Filed Weights   04/25/22 2340  Weight: 51.9 kg    Examination:  General exam: Appears comfortable, not in any acute distress. Respiratory system: CTA bilaterally, respiratory effort normal, RR 15. Cardiovascular system: S1 & S2 heard, regular rate and rhythm, no murmur. Gastrointestinal system: Abdomen is soft, nontender, nondistended, BS+ Central nervous system: Alert and oriented x 3. No focal neurological deficits. Extremities: Left hip tenderness, status post ORIF.  POD 1 Skin: No rashes, lesions or ulcers Psychiatry: Judgement and insight appear normal. Mood & affect appropriate.     Data Reviewed: I have personally reviewed following labs and imaging studies  CBC: Recent Labs  Lab 04/25/22 1857 04/26/22 0136 04/27/22 0311  WBC 6.6 7.4 9.8  NEUTROABS 5.2  --   --   HGB 12.1 11.8* 10.2*  HCT 36.8 34.7* 29.4*  MCV 91.1 89.0 89.1  PLT 173 158 671*   Basic Metabolic Panel: Recent Labs  Lab 04/22/22 1440 04/25/22  1857 04/26/22 0234 04/27/22 0311  NA 144 143 143 138  K 4.0 3.6 3.7 3.6  CL 107 109 110 107  CO2 30 27 23 23   GLUCOSE 165* 244* 165* 142*  BUN 15 14 12 16   CREATININE 1.36* 1.46* 1.25* 1.48*  CALCIUM 10.2 9.7 9.7 9.1   GFR: Estimated Creatinine Clearance: 21.6 mL/min (A) (by C-G formula based on SCr of 1.48 mg/dL (H)). Liver Function Tests: Recent Labs  Lab  04/22/22 1440 04/25/22 1857  AST 29 30  ALT 29 31  ALKPHOS 128* 117  BILITOT 1.5* 1.1  PROT 6.5 5.8*  ALBUMIN 4.0 3.5   No results for input(s): "LIPASE", "AMYLASE" in the last 168 hours. No results for input(s): "AMMONIA" in the last 168 hours. Coagulation Profile: No results for input(s): "INR", "PROTIME" in the last 168 hours. Cardiac Enzymes: No results for input(s): "CKTOTAL", "CKMB", "CKMBINDEX", "TROPONINI" in the last 168 hours. BNP (last 3 results) No results for input(s): "PROBNP" in the last 8760 hours. HbA1C: No results for input(s): "HGBA1C" in the last 72 hours. CBG: Recent Labs  Lab 04/26/22 1432 04/26/22 1942 04/26/22 2355 04/27/22 0405 04/27/22 0727  GLUCAP 134* 306* 154* 140* 168*   Lipid Profile: No results for input(s): "CHOL", "HDL", "LDLCALC", "TRIG", "CHOLHDL", "LDLDIRECT" in the last 72 hours. Thyroid Function Tests: No results for input(s): "TSH", "T4TOTAL", "FREET4", "T3FREE", "THYROIDAB" in the last 72 hours. Anemia Panel: No results for input(s): "VITAMINB12", "FOLATE", "FERRITIN", "TIBC", "IRON", "RETICCTPCT" in the last 72 hours. Sepsis Labs: No results for input(s): "PROCALCITON", "LATICACIDVEN" in the last 168 hours.  Recent Results (from the past 240 hour(s))  Surgical PCR screen     Status: None   Collection Time: 04/25/22 11:56 PM   Specimen: Nasal Mucosa; Nasal Swab  Result Value Ref Range Status   MRSA, PCR NEGATIVE NEGATIVE Final   Staphylococcus aureus NEGATIVE NEGATIVE Final    Comment: (NOTE) The Xpert SA Assay (FDA approved for NASAL specimens in patients 19 years of age and older), is one component of a comprehensive surveillance program. It is not intended to diagnose infection nor to guide or monitor treatment. Performed at [facility], [place] [address], [place],  [address]     Radiology Studies: DG FEMUR MIN 2 VIEWS LEFT  Result Date: 04/26/2022 CLINICAL DATA:  Left hip ORIF EXAM: LEFT FEMUR 2 VIEWS  COMPARISON:  04/25/2022 FINDINGS: Interval postsurgical changes from ORIF of intertrochanteric left femur fracture with long intramedullary rod, proximal lag screw, and distal interlocking screws. Anatomic alignment at the fracture site. No new fractures. No malalignment. Expected postoperative changes within the overlying soft tissues. IMPRESSION: Interval ORIF of intertrochanteric left femur fracture. No postoperative complications are evident. Electronically Signed   By: Davina Poke D.O.   On: 04/26/2022 14:11   DG FEMUR MIN 2 VIEWS LEFT  Result Date: 04/26/2022 CLINICAL DATA:  Left hip surgery EXAM: LEFT FEMUR 2 VIEWS COMPARISON:  04/25/2022 FINDINGS: Six C-arm fluoroscopic images were obtained intraoperatively and submitted for post operative interpretation. Images demonstrate placement of ORIF hardware traversing proximal left femur fracture with improved alignment. 105 seconds fluoroscopy time utilized. Radiation dose: 6.36 mGy. Please see the performing provider's procedural report for further detail. IMPRESSION: Intraoperative fluoroscopy provided for left hip ORIF. Electronically Signed   By: Davina Poke D.O.   On: 04/26/2022 13:31   DG C-Arm 1-60 Min-No Report  Result Date: 04/26/2022 Fluoroscopy was utilized by the requesting physician.  No radiographic interpretation.   DG Femur  Min 2 Views Left  Result Date: 04/25/2022 CLINICAL DATA:  Left hip fracture. EXAM: LEFT FEMUR 2 VIEWS COMPARISON:  Earlier same day hip radiograph at 1915 hours FINDINGS: Comminuted intertrochanteric fracture, better visualized on earlier same day radiograph secondary to overlying soft tissues. No other acute fracture of the left femur. Partially visualized intramedullary rod with proximal locking screws in the tibia. Small ossification noted adjacent to the medial femoral condyle, likely sequela from prior trauma. No suprapatellar knee joint effusion. Vascular calcifications are seen in the left thigh  and left lower leg. IMPRESSION: Acute comminuted intertrochanteric fracture, better visualized on earlier same day hip radiograph due to overlying soft tissues. No other acute fractures of the left femur. Electronically Signed   By: Ileana Roup M.D.   On: 04/25/2022 20:38   DG Chest 1 View  Result Date: 04/25/2022 CLINICAL DATA:  Fall EXAM: CHEST  1 VIEW COMPARISON:  Chest x-ray 09/27/2021 FINDINGS: The heart size and mediastinal contours are within normal limits. Both lungs are clear. The visualized skeletal structures are unremarkable. IMPRESSION: No active disease. Electronically Signed   By: Ronney Asters M.D.   On: 04/25/2022 19:49   DG Hip Unilat W or Wo Pelvis 2-3 Views Left  Result Date: 04/25/2022 CLINICAL DATA:  Hip pain, fall. EXAM: DG HIP (WITH OR WITHOUT PELVIS) 2-3V LEFT COMPARISON:  None. FINDINGS: There is a comminuted left femoral intratrochanteric fracture with apex lateral angulation and overlying soft tissue swelling. There is no dislocation identified. IMPRESSION: Comminuted left femoral intratrochanteric fracture. Electronically Signed   By: Ronney Asters M.D.   On: 04/25/2022 19:44    Scheduled Meds:  acetaminophen  500 mg Oral Q6H   carvedilol  25 mg Oral BID WC   docusate sodium  100 mg Oral BID   enoxaparin (LOVENOX) injection  40 mg Subcutaneous Q24H   insulin aspart  0-6 Units Subcutaneous Q4H   mupirocin ointment  1 Application Nasal BID   Continuous Infusions:   LOS: 2 days    Time spent: 50 mins    Geovanny Sartin, MD [facility]   If 7PM-7AM, please contact night-coverage

## 2022-04-27 NOTE — Code Documentation (Addendum)
Stroke Response Nurse Documentation Code Documentation  Brandy Gonzales is a 81 y.o. female admitted to [facility]  on 04/25/2022 for left hip fracture s/p ORIF with past medical hx of HTN, type 2 diabetes, and CKD. On No antithrombotic. Code stroke was activated by [place], [place] RN.   Patient on 2W med-surg unit where she was LKW at 1130 and now complaining of aphasia. Pt was working with PT this morning when she reportably had a syncopal episode. She was back to her baseline following that episode. At 1130 her primary RN came in to assess her pain and when she returned with pain medication she noted the patient was having trouble speaking. Pt did not receive Norco at 1151, as RN noted pt to have a change in mentation.  Stroke team at the bedside after patient activation. Patient to CT with team. NIHSS 12, see documentation for details and code stroke times. Patient with disoriented, not following commands, left facial droop, bilateral leg weakness, and Global aphasia  on exam. The following imaging was completed:  CT Head and CTA. Patient requiring Fentanyl 34mcg x2 doses for pain during imaging. Patient is not a candidate for IV Thrombolytic due to recent surgery and history of ICH according to Dr. Lorrin Goodell. Patient is not a candidate for IR due to negative for LVO per CTA.   Care/Plan: q2h NIHSS and VS. Pt with transfer orders for PCU. MRI ordered. Complete bedside swallow evaluation once patient is more awake. Permissive hypertension, BP under 220/110.   Bedside handoff with RN Henlande. Dr. Dwyane Dee, primary MD updated at 1322.   Samary Shatz L Myriam Brandhorst  Rapid Response RN

## 2022-04-27 NOTE — Progress Notes (Signed)
Inpatient Diabetes Program Recommendations  AACE/ADA: New Consensus Statement on Inpatient Glycemic Control (2015)  Target Ranges:  Prepandial:   less than 140 mg/dL      Peak postprandial:   less than 180 mg/dL (1-2 hours)      Critically ill patients:  140 - 180 mg/dL   Lab Results  Component Value Date   GLUCAP 183 (H) 04/27/2022   HGBA1C 7.8 (H) 04/22/2022    Review of Glycemic Control  Latest Reference Range & Units 04/26/22 14:32 04/26/22 19:42 04/26/22 23:55 04/27/22 04:05 04/27/22 07:27 04/27/22 11:11  Glucose-Capillary 70 - 99 mg/dL 134 (H) 306 (H) 154 (H) 140 (H) 168 (H) 183 (H)   Diabetes history: DM 2 Outpatient Diabetes medications:  Amaryl 2 mg with breakfast Farxiga 10 mg daily Current orders for Inpatient glycemic control:  Novolog 0-6 units q 4 hours  Inpatient Diabetes Program Recommendations:    Please consider changing Novolog correction to sensitive (0-9 units) tid with meals and HS scale.  Also consider adding CHO modified to diet.   Thanks,  Adah Perl, RN, BC-ADM Inpatient Diabetes Coordinator Pager [phone]  (8a-5p)

## 2022-04-27 NOTE — Progress Notes (Signed)
PT Cancellation Note  Patient Details Name: Brandy Gonzales MRN: [id number] DOB: May 20, 1940   Cancelled Treatment:    Reason Eval/Treat Not Completed: Other (comment) (await weight bearing status)   [name] B [name] 04/27/2022, 7:59 AM [place] Office: [phone]

## 2022-04-27 NOTE — Evaluation (Signed)
Physical Therapy Evaluation Patient Details Name: Brandy Gonzales MRN: [id number] DOB: 19-Jan-1941 Today's Date: 04/27/2022  History of Present Illness  81 yo admitted 12/16 after fall with left hip fx s/p IM nail 12/17. PMhx; dementia, GERD, glaucoma, syncope  Clinical Impression  Pt pleasant with decreased cognition and inability to recall fall leading to admission. Pt with decreased strength, function, cognition and mobility limited by syncope in sitting and unable to get BP in sitting. BP with return to supine 121/66, HR 63 and SPO2 100% on RA. Pt lives with daughter with assist of family and son currently agreeable to SNF at D/C.   Pt will benefit from acute therapy to maximize mobility, safety, function and transfers to decrease burden of care.      Recommendations for follow up therapy are one component of a multi-disciplinary discharge planning process, led by the attending physician.  Recommendations may be updated based on patient status, additional functional criteria and insurance authorization.  Follow Up Recommendations Skilled nursing-short term rehab (<3 hours/day) Can patient physically be transported by private vehicle: No    Assistance Recommended at Discharge Frequent or constant Supervision/Assistance  Patient can return home with the following  Two people to help with walking and/or transfers;Assistance with cooking/housework;Direct supervision/assist for medications management;Assist for transportation;Help with stairs or ramp for entrance;A lot of help with bathing/dressing/bathroom    Equipment Recommendations BSC/3in1  Recommendations for Other Services  OT consult    Functional Status Assessment Patient has had a recent decline in their functional status and demonstrates the ability to make significant improvements in function in a reasonable and predictable amount of time.     Precautions / Restrictions Precautions Precautions: Fall Precaution Comments: check  BP Restrictions Weight Bearing Restrictions: Yes LLE Weight Bearing: Weight bearing as tolerated      Mobility  Bed Mobility Overal bed mobility: Needs Assistance Bed Mobility: Supine to Sit, Sit to Supine     Supine to sit: Mod assist Sit to supine: Max assist   General bed mobility comments: pt with mod assist to pivot legs to EOB toward left, physical assist to elevate trunk from surface and scoot fully to EOB. Max assist to return to supine due to syncope. Total +2 to slide toward [facility]    Transfers                   General transfer comment: pt unable    Ambulation/Gait                  Stairs            Wheelchair Mobility    Modified Rankin (Stroke Patients Only)       Balance Overall balance assessment: History of Falls, Needs assistance Sitting-balance support: Feet supported, No upper extremity supported Sitting balance-Leahy Scale: Poor                                       Pertinent Vitals/Pain Pain Assessment Pain Assessment: 0-10 Pain Score: 4  Pain Location: left hip Pain Descriptors / Indicators: Aching, Grimacing Pain Intervention(s): Limited activity within patient's tolerance, Monitored during session    Home Living Family/patient expects to be discharged to:: Private residence Living Arrangements: Children Available Help at Discharge: Family;Available 24 hours/day Type of Home: House Home Access: Ramped entrance       Home Layout: One level Home Equipment: Conservation officer, nature (2 wheels)  Prior Function Prior Level of Function : Independent/Modified Independent             Mobility Comments: pt walks with RW ADLs Comments: performs ADLs without assist     Hand Dominance        Extremity/Trunk Assessment   Upper Extremity Assessment Upper Extremity Assessment: Generalized weakness    Lower Extremity Assessment Lower Extremity Assessment: Generalized weakness    Cervical / Trunk  Assessment Cervical / Trunk Assessment: Kyphotic  Communication   Communication: No difficulties  Cognition Arousal/Alertness: Awake/alert Behavior During Therapy: Flat affect Overall Cognitive Status: History of cognitive impairments - at baseline                                 General Comments: pt with difficulty providing PLOF with son present to clarify. Pt with difficulty following single step commands with mobility        General Comments      Exercises General Exercises - Lower Extremity Heel Slides: AAROM, Left, Supine, 5 reps   Assessment/Plan    PT Assessment Patient needs continued PT services  PT Problem List Decreased strength;Decreased mobility;Decreased activity tolerance;Decreased balance;Decreased knowledge of use of DME;Pain;Decreased cognition;Decreased range of motion       PT Treatment Interventions DME instruction;Stair training;Balance training;Functional mobility training;Therapeutic exercise;Patient/family education;Gait training;Therapeutic activities;Cognitive remediation    PT Goals (Current goals can be found in the Care Plan section)  Acute Rehab PT Goals Patient Stated Goal: return home PT Goal Formulation: With patient/family Time For Goal Achievement: 05/11/22 Potential to Achieve Goals: Fair    Frequency Min 4X/week     Co-evaluation               AM-PAC PT "6 Clicks" Mobility  Outcome Measure Help needed turning from your back to your side while in a flat bed without using bedrails?: A Lot Help needed moving from lying on your back to sitting on the side of a flat bed without using bedrails?: A Lot Help needed moving to and from a bed to a chair (including a wheelchair)?: Total Help needed standing up from a chair using your arms (e.g., wheelchair or bedside chair)?: Total Help needed to walk in hospital room?: Total Help needed climbing 3-5 steps with a railing? : Total 6 Click Score: 8    End of Session    Activity Tolerance: Other (comment) (limited by syncope) Patient left: in bed;with call bell/phone within reach;with bed alarm set;with nursing/sitter in room;with family/visitor present Nurse Communication: Mobility status;Precautions;Weight bearing status PT Visit Diagnosis: Other abnormalities of gait and mobility (R26.89);Muscle weakness (generalized) (M62.81);History of falling (Z91.81)    Time: 8889-1694 PT Time Calculation (min) (ACUTE ONLY): 22 min   Charges:   PT Evaluation $PT Eval Moderate Complexity: 1 Mod          [place], PT Acute Rehabilitation Services Office: [phone]   Vicki Pasqual B Kathy Wares 04/27/2022, 11:30 AM

## 2022-04-28 DIAGNOSIS — E43 Unspecified severe protein-calorie malnutrition: Secondary | ICD-10-CM | POA: Insufficient documentation

## 2022-04-28 DIAGNOSIS — S72002A Fracture of unspecified part of neck of left femur, initial encounter for closed fracture: Secondary | ICD-10-CM | POA: Diagnosis not present

## 2022-04-28 LAB — BASIC METABOLIC PANEL
Anion gap: 14 (ref 5–15)
BUN: 20 mg/dL (ref 8–23)
CO2: 21 mmol/L — ABNORMAL LOW (ref 22–32)
Calcium: 9.3 mg/dL (ref 8.9–10.3)
Chloride: 104 mmol/L (ref 98–111)
Creatinine, Ser: 1.68 mg/dL — ABNORMAL HIGH (ref 0.44–1.00)
GFR, Estimated: 30 mL/min — ABNORMAL LOW (ref >60)
Glucose, Bld: 162 mg/dL — ABNORMAL HIGH (ref 70–99)
Potassium: 3.6 mmol/L (ref 3.5–5.1)
Sodium: 139 mmol/L (ref 135–145)

## 2022-04-28 LAB — CBC
HCT: 29.7 % — ABNORMAL LOW (ref 36.0–46.0)
Hemoglobin: 10 g/dL — ABNORMAL LOW (ref 12.0–15.0)
MCH: 30 pg (ref 26.0–34.0)
MCHC: 33.7 g/dL (ref 30.0–36.0)
MCV: 89.2 fL (ref 80.0–100.0)
Platelets: 146 10*3/uL — ABNORMAL LOW (ref 150–400)
RBC: 3.33 MIL/uL — ABNORMAL LOW (ref 3.87–5.11)
RDW: 14.5 % (ref 11.5–15.5)
WBC: 9 10*3/uL (ref 4.0–10.5)
nRBC: 0 % (ref 0.0–0.2)

## 2022-04-28 LAB — GLUCOSE, CAPILLARY
Glucose-Capillary: 152 mg/dL — ABNORMAL HIGH (ref 70–99)
Glucose-Capillary: 156 mg/dL — ABNORMAL HIGH (ref 70–99)
Glucose-Capillary: 172 mg/dL — ABNORMAL HIGH (ref 70–99)
Glucose-Capillary: 247 mg/dL — ABNORMAL HIGH (ref 70–99)
Glucose-Capillary: 272 mg/dL — ABNORMAL HIGH (ref 70–99)
Glucose-Capillary: 289 mg/dL — ABNORMAL HIGH (ref 70–99)

## 2022-04-28 LAB — MAGNESIUM: Magnesium: 1.8 mg/dL (ref 1.7–2.4)

## 2022-04-28 LAB — PHOSPHORUS: Phosphorus: 3.3 mg/dL (ref 2.5–4.6)

## 2022-04-28 NOTE — Progress Notes (Signed)
PROGRESS NOTE    SOO STEELMAN  DSK:[id number] DOB: May 28, 1940 DOA: 04/25/2022  PCP: Cassandria Anger, MD   Brief Narrative:  This 81 years old female with PMH significant for hypertension, type 2 diabetes and CKD stage IIIb presented in the ED s/p fall at home with left hip pain.  Patient had been in her usual state of health and was looking after her grandchild when she fell.  She was unable to bear weight on her left leg and complaining about severe left hip pain.  Workup in the ED reveals comminuted left femoral intertrochanteric fracture.  Orthopedics was consulted and s/p ORIF.  Postoperative day 2. While during physical therapy session patient briefly syncopized, code stroke was called,  Patient had EEG and MRI which ruled out acute stroke.  Neurology signed off.  Assessment & Plan:   Principal Problem:   Closed left hip fracture, initial encounter [facility]) Active Problems:   Controlled type 2 diabetes mellitus with diabetic nephropathy (HCC)   Essential hypertension   Stage 3b chronic kidney disease (CKD) (HCC)   Protein-calorie malnutrition, severe  Left Femoral Intertrochanteric fracture: She presented s/p fall at home.   Workup reveals left femoral intertrochanteric fracture. Orthopedics consulted, underwent ORIF, POD 2. Weightbearing as tolerated.  PT and OT eval. Adequate pain control with pain medications.  Essential hypertension: Continue Coreg 25 mg twice daily. Blood pressure is well-controlled.  Type II DM: Hemoglobin A1c 7.8 in December 2023. Continue regular insulin sliding scale.  CKD stage IIIb: Serum creatinine at baseline.  Baseline creatinine 1.2-1.4. Continue to monitor renal functions and electrolytes. Avoid nephrotoxic medications.  Syncope: While during physical therapy session,  Patient briefly syncopized. Code stroke was called,  Patient had CT head, MRI and EEG all unremarkable. She does have low vitamin B12 and folate that could be  contributing factor. Start vitamin B12 and folate.   DVT prophylaxis: Lovenox Code Status:Full code. Family Communication:  Husband at bed side. Disposition Plan:   Status is: Inpatient Remains inpatient appropriate because: Admitted s/p mechanical fall underwent ORIF for left IT fracture. Postoperative day 2.  PT and OT pending.  Briefly syncopized during PT session stroke workup unremarkable.  Neurology signed off.   Consultants:  Orthopedics  Procedures: ORIF POD 2 Antimicrobials: None  Subjective: Patient was seen and examined at bedside.  Overnight events noted. She reports doing much better.  Denies any dizziness, shortness of breath, palpitations. S/p ORIF.  POD 2.  She still reports having left thigh pain.  Objective: Vitals:   04/28/22 0400 04/28/22 0621 04/28/22 0818 04/28/22 1200  BP: (!) 164/95  (!) 162/74 (!) 170/78  Pulse: 73  73 65  Resp: 15  16 20   Temp: 98.7 F (37.1 C)  98.3 F (36.8 C) (!) 97.5 F (36.4 C)  TempSrc: Oral  Oral Oral  SpO2: 98%  100% 100%  Weight:  59.4 kg      Intake/Output Summary (Last 24 hours) at 04/28/2022 1501 Last data filed at 04/28/2022 0400 Gross per 24 hour  Intake 420.41 ml  Output 200 ml  Net 220.41 ml   Filed Weights   04/25/22 2340 04/28/22 0621  Weight: 51.9 kg 59.4 kg    Examination:  General exam: Appears comfortable, not in any acute distress. Respiratory system: CTA bilaterally, respiratory effort normal, RR 15. Cardiovascular system: S1 & S2 heard, regular rate and rhythm, no murmur. Gastrointestinal system: Abdomen is soft, non tender, non distended, BS+ Central nervous system: Alert and oriented x 3.  No focal neurological deficits. Extremities: Left hip tenderness, status post ORIF.  POD 2 Skin: No rashes, lesions or ulcers Psychiatry: Judgement and insight appear normal. Mood & affect appropriate.     Data Reviewed: I have personally reviewed following labs and imaging studies  CBC: Recent  Labs  Lab 04/25/22 1857 04/26/22 0136 04/27/22 0311 04/28/22 0350  WBC 6.6 7.4 9.8 9.0  NEUTROABS 5.2  --   --   --   HGB 12.1 11.8* 10.2* 10.0*  HCT 36.8 34.7* 29.4* 29.7*  MCV 91.1 89.0 89.1 89.2  PLT 173 158 126* 102*   Basic Metabolic Panel: Recent Labs  Lab 04/22/22 1440 04/25/22 1857 04/26/22 0234 04/27/22 0311 04/28/22 0350  NA 144 143 143 138 139  K 4.0 3.6 3.7 3.6 3.6  CL 107 109 110 107 104  CO2 30 27 23 23  21*  GLUCOSE 165* 244* 165* 142* 162*  BUN 15 14 12 16 20   CREATININE 1.36* 1.46* 1.25* 1.48* 1.68*  CALCIUM 10.2 9.7 9.7 9.1 9.3  MG  --   --   --   --  1.8  PHOS  --   --   --   --  3.3   GFR: Estimated Creatinine Clearance: 20.3 mL/min (A) (by C-G formula based on SCr of 1.68 mg/dL (H)). Liver Function Tests: Recent Labs  Lab 04/22/22 1440 04/25/22 1857  AST 29 30  ALT 29 31  ALKPHOS 128* 117  BILITOT 1.5* 1.1  PROT 6.5 5.8*  ALBUMIN 4.0 3.5   No results for input(s): "LIPASE", "AMYLASE" in the last 168 hours. No results for input(s): "AMMONIA" in the last 168 hours. Coagulation Profile: No results for input(s): "INR", "PROTIME" in the last 168 hours. Cardiac Enzymes: No results for input(s): "CKTOTAL", "CKMB", "CKMBINDEX", "TROPONINI" in the last 168 hours. BNP (last 3 results) No results for input(s): "PROBNP" in the last 8760 hours. HbA1C: No results for input(s): "HGBA1C" in the last 72 hours. CBG: Recent Labs  Lab 04/27/22 2138 04/28/22 0010 04/28/22 0351 04/28/22 0827 04/28/22 1204  GLUCAP 234* 247* 172* 156* 152*   Lipid Profile: No results for input(s): "CHOL", "HDL", "LDLCALC", "TRIG", "CHOLHDL", "LDLDIRECT" in the last 72 hours. Thyroid Function Tests: Recent Labs    04/27/22 1328  TSH 1.460   Anemia Panel: Recent Labs    04/27/22 1328  VITAMINB12 208  FOLATE 5.9*   Sepsis Labs: No results for input(s): "PROCALCITON", "LATICACIDVEN" in the last 168 hours.  Recent Results (from the past 240 hour(s))   Surgical PCR screen     Status: None   Collection Time: 04/25/22 11:56 PM   Specimen: Nasal Mucosa; Nasal Swab  Result Value Ref Range Status   MRSA, PCR NEGATIVE NEGATIVE Final   Staphylococcus aureus NEGATIVE NEGATIVE Final    Comment: (NOTE) The Xpert SA Assay (FDA approved for NASAL specimens in patients 28 years of age and older), is one component of a comprehensive surveillance program. It is not intended to diagnose infection nor to guide or monitor treatment. Performed at [facility], [place] [address], [place], [place] [address]     Radiology Studies: MR BRAIN WO CONTRAST  Result Date: 04/27/2022 CLINICAL DATA:  Altered mental status EXAM: MRI HEAD WITHOUT CONTRAST TECHNIQUE: Multiplanar, multiecho pulse sequences of the brain and surrounding structures were obtained without intravenous contrast. COMPARISON:  Same-day CT/CTA head and neck, brain MRI 10/17/2021 FINDINGS: Image quality is intermittently motion degraded. Specifically, the axial FLAIR sequence is moderately motion degraded. Brain:  There is no evidence of acute intracranial hemorrhage, extra-axial fluid collection, or acute territorial infarct. Tiny foci of SWI signal abnormality in the high right frontoparietal region are favored artifactual. There is unchanged parenchymal volume loss with prominence of the ventricular system and extra-axial CSF spaces. There is extensive patchy and confluence FLAIR signal abnormality throughout the supratentorial brain consistent with advanced chronic small-vessel ischemic change. Encephalomalacia in the left frontal lobe consistent with prior infarct is unchanged. Small remote infarcts in the bilateral cerebellar hemispheres are unchanged. Old hemorrhage in the bilateral parietal lobes is unchanged likely associated with prior infarcts There is no mass lesion.  There is no mass effect or midline shift. Vascular: Normal flow voids. Skull and upper cervical spine: Normal marrow signal.  Sinuses/Orbits: The paranasal sinuses are clear. Bilateral lens implants are in place. The globes and orbits are otherwise unremarkable. Other: None. IMPRESSION: 1. No definite evidence of acute intracranial pathology, within the confines of motion degraded images. 2. Stable chronic findings as above. Electronically Signed   By: Valetta Mole M.D.   On: 04/27/2022 21:37   EEG adult  Result Date: 04/27/2022 Lora Havens, MD     04/27/2022  2:48 PM Patient Name: EIMI VINEY MRN: [id number] Epilepsy Attending: Lora Havens Referring Physician/Provider: Donnetta Simpers, MD Date: 04/27/2022 Duration: 22.03 mins Patient history: 81 year old female with presyncopal episode this morning now with aphasia.  EEG to evaluate for seizure. Level of alertness: Awake AEDs during EEG study: None Technical aspects: This EEG study was done with scalp electrodes positioned according to the 10-20 International system of electrode placement. Electrical activity was reviewed with band pass filter of 1-70Hz , sensitivity of 7 uV/mm, display speed of 64mm/sec with a 60Hz  notched filter applied as appropriate. EEG data were recorded continuously and digitally stored.  Video monitoring was available and reviewed as appropriate. Description: No clear posterior dominant rhythm was seen.  EEG showed continuous generalized predominantly 5 to 7 Hz theta slowing admixed with intermittent generalized 2 to 3 Hz delta slowing. Hyperventilation and photic stimulation were not performed.   ABNORMALITY - Continuous slow, generalized IMPRESSION: This study is suggestive of moderate diffuse encephalopathy, nonspecific etiology. No seizures or epileptiform discharges were seen throughout the recording. Priyanka Barbra Sarks   CT ANGIO HEAD NECK W WO CM (CODE STROKE)  Result Date: 04/27/2022 CLINICAL DATA:  Aphasia out of proportion to Encephalopathy. EXAM: CT ANGIOGRAPHY HEAD AND NECK TECHNIQUE: Multidetector CT imaging of the head and neck  was performed using the standard protocol during bolus administration of intravenous contrast. Multiplanar CT image reconstructions and MIPs were obtained to evaluate the vascular anatomy. Carotid stenosis measurements (when applicable) are obtained utilizing NASCET criteria, using the distal internal carotid diameter as the denominator. RADIATION DOSE REDUCTION: This exam was performed according to the departmental dose-optimization program which includes automated exposure control, adjustment of the mA and/or kV according to patient size and/or use of iterative reconstruction technique. CONTRAST:  84mL OMNIPAQUE IOHEXOL 350 MG/ML SOLN COMPARISON:  Head CT earlier same day.  MRI 11/16/2021 FINDINGS: CTA NECK FINDINGS Aortic arch: Aortic atherosclerosis. Branching pattern is normal without origin stenosis. Right carotid system: Common carotid artery widely patent to the bifurcation. Calcified plaque at the carotid bifurcation and ICA bulb but no stenosis. Cervical ICA widely patent. Left carotid system: Common carotid artery widely patent to the bifurcation. No plaque at the bifurcation or ICA bulb. No ICA stenosis. Vertebral arteries: Both vertebral arteries widely patent at their origins and through the cervical  region to the foramen magnum. Skeleton: Chronic cervical spondylosis. Other neck: No mass or lymphadenopathy. Upper chest: Normal Review of the MIP images confirms the above findings CTA HEAD FINDINGS Anterior circulation: Both internal carotid arteries are patent through the skull base and siphon regions. There is ordinary siphon atherosclerotic calcification but no stenosis greater than 30-50%. The anterior and middle cerebral vessels are patent. No large vessel occlusion or proximal stenosis. No aneurysm or vascular malformation. Posterior circulation: Both vertebral arteries widely patent to the basilar. No basilar stenosis. Posterior circulation branch vessels are normal. Venous sinuses: Patent and  normal. Anatomic variants: None significant. Review of the MIP images confirms the above findings IMPRESSION: 1. No large vessel occlusion or proximal stenosis. 2. Aortic atherosclerosis. 3. Atherosclerotic change at the right carotid bifurcation but no stenosis. 4. Atherosclerotic disease in both carotid siphon regions but without stenosis greater than 30-50% suspected. Aortic Atherosclerosis (ICD10-I70.0). Electronically Signed   By: Nelson Chimes M.D.   On: 04/27/2022 13:10   CT HEAD CODE STROKE WO CONTRAST  Result Date: 04/27/2022 CLINICAL DATA:  Code stroke. Neuro deficit, acute, stroke suspected. EXAM: CT HEAD WITHOUT CONTRAST TECHNIQUE: Contiguous axial images were obtained from the base of the skull through the vertex without intravenous contrast. RADIATION DOSE REDUCTION: This exam was performed according to the departmental dose-optimization program which includes automated exposure control, adjustment of the mA and/or kV according to patient size and/or use of iterative reconstruction technique. COMPARISON:  11/16/2021 FINDINGS: Brain: Generalized atrophy. No focal abnormality seen affecting the brainstem or cerebellum. Extensive chronic appearing small vessel ischemic changes of the cerebral hemispheric white matter. No sign of acute large vessel territory stroke. No mass lesion, hemorrhage, hydrocephalus or extra-axial collection. Vascular: There is atherosclerotic calcification of the major vessels at the base of the brain. Skull: Negative Sinuses/Orbits: Clear/normal Other: None ASPECTS ([place] Stroke Program Early CT Score) - Ganglionic level infarction (caudate, lentiform nuclei, internal capsule, insula, M1-M3 cortex): 7 - Supraganglionic infarction (M4-M6 cortex): 3 Total score (0-10 with 10 being normal): 10 IMPRESSION: 1. No acute CT finding. Atrophy and extensive chronic small-vessel ischemic changes of the white matter. 2. Aspects is 10. These results were communicated to Dr. Lorrin Goodell  at 12:54 pm on 04/27/2022 by text page via the [facility] messaging system. Electronically Signed   By: Nelson Chimes M.D.   On: 04/27/2022 12:55    Scheduled Meds:  carvedilol  25 mg Oral BID WC   cyanocobalamin  1,000 mcg Intramuscular Daily   Followed by   Derrill Memo ON 05/02/2022] vitamin B-12  1,000 mcg Oral Daily   docusate sodium  100 mg Oral BID   enoxaparin (LOVENOX) injection  40 mg Subcutaneous [id number]   folic acid  1 mg Oral Daily   Or   folic acid  1 mg Intravenous Daily   insulin aspart  0-6 Units Subcutaneous Q4H   mupirocin ointment  1 Application Nasal BID   thiamine (VITAMIN B1) injection  100 mg Intravenous Daily   Or   thiamine  100 mg Oral Daily   Continuous Infusions:  sodium chloride 1,000 mL (04/28/22 0841)     LOS: 3 days    Time spent: 35 mins    Dylon Correa, MD [facility]   If 7PM-7AM, please contact night-coverage

## 2022-04-28 NOTE — Progress Notes (Signed)
Orthopaedics Daily Progress Note   04/28/2022   8:28 AM  Brandy Gonzales is a 81 y.o. female 2 Days Post-Op s/p INTRAMEDULLARY (IM) NAIL INTERTROCHANTERIC  Subjective Pain well controlled.  Denies nausea, vomiting, or fevers.   Objective Vitals:   04/28/22 0400 04/28/22 0818  BP: (!) 164/95 (!) 162/74  Pulse: 73 73  Resp: 15 16  Temp: 98.7 F (37.1 C) 98.3 F (36.8 C)  SpO2: 98% 100%    Intake/Output Summary (Last 24 hours) at 04/28/2022 3382 Last data filed at 04/28/2022 0400 Gross per 24 hour  Intake 540.41 ml  Output 200 ml  Net 340.41 ml     Physical Exam LLE: Dressing clean, dry, and intact +DF/PF/EHL SILT SP/DP/T +DP/PT and WWP distally  Assessment 81 y.o. female s/p Procedure(s) (LRB): INTRAMEDULLARY (IM) NAIL INTERTROCHANTERIC (Left)  Plan Mobility: Out of bed with PT/OT Pain control: Continue to wean/titrate to appropriate oral regimen DVT Prophylaxis: Lovenox 40 mg daily x 6 weeks postoperatively Further surgical plans: None LLE: Weightbearing as tolerated, no restrictions Dressing care: Keep AQUACEL on and dry for up to 14 days.  Do not allow surgical area to get wet before that.  Remove AQUACEL dressing after 14 days and allow area to get wet in shower but DO NOT SUBMERGE until wound is evaluated in clinic.  In most cases skin glue is used and no additional dressing is necessary.  Disposition: Per primary team is medically appropriate Follow-up: Please call [facility] [phone]) to schedule follow-op appointment for 2 weeks after surgery.  I have verified that my discharge instructions and follow-up information have been entered in the Discharge Navigator in Epic.  These should automatically populate in the AVS.  Please print the AVS in its entirety and ensure that the patient or a responsible party has a complete copy of the AVS before they are discharged.  If there are questions regarding discharge instructions or  follow-up before the AVS is generated, please check the Discharge Navigator before attempting to contact the surgeon/office.  If unsure how to access the Discharge Navigator or the information contained in the Discharge Navigator, or how to generate/print the AVS, please contact the appropriate Nurse, learning disability.   The patient's postoperative prescriptions (e.g. pain medication, anticoagulation) have been printed, signed, and placed in/on the patient's hard chart: Lovenox and Norco    Georgeanna Harrison M.D. Orthopaedic Surgery [facility]

## 2022-04-28 NOTE — Plan of Care (Signed)
Pt is tolerating diet well adequately reporting pai and understands rationale for TEDS

## 2022-04-28 NOTE — Anesthesia Postprocedure Evaluation (Signed)
Anesthesia Post Note  Patient: [name]  Procedure(s) Performed: INTRAMEDULLARY (IM) NAIL INTERTROCHANTERIC (Left)     Patient location during evaluation: PACU Anesthesia Type: MAC and Spinal Level of consciousness: awake and alert Pain management: pain level controlled Vital Signs Assessment: post-procedure vital signs reviewed and stable Respiratory status: spontaneous breathing, nonlabored ventilation and respiratory function stable Cardiovascular status: stable and blood pressure returned to baseline Postop Assessment: no apparent nausea or vomiting Anesthetic complications: no  No notable events documented.  Last Vitals:  Vitals:   04/28/22 0400 04/28/22 0818  BP: (!) 164/95 (!) 162/74  Pulse: 73 73  Resp: 15 16  Temp: 37.1 C 36.8 C  SpO2: 98% 100%    Last Pain:  Vitals:   04/28/22 0818  TempSrc: Oral  PainSc:                  Patrece Tallie

## 2022-04-28 NOTE — Evaluation (Addendum)
Occupational Therapy Evaluation Patient Details Name: Brandy Gonzales MRN: [id number] DOB: Dec 06, 1940 Today's Date: 04/28/2022   History of Present Illness 81 yo admitted 12/16 after fall with left hip fx s/p IM nail 12/17. PMhx; dementia, GERD, glaucoma, syncope, HTN, DM   Clinical Impression   Pt in bed upon therapy arrival with Son present in room. Pt agreeable to participate in OT evaluation. Prior to admit, pt was completing BADL tasks without assistance. Currently, pt presents with increased pain, decreased strength, standing/sitting balance, endurance, and activity tolerance requiring increased physical assist to complete BADL tasks and functional transfers. Pt experienced increased difficulty with safe RW management during step pvt transfer to/from bed to recliner and required physical assist in addition to tactile and verbal cues to complete transfer. Pt with low standing tolerance and able to complete ~50% of transfer with Min-Mod A before attempting to sit unsafely requiring therapists to physically complete remainder of transfer to safely sit on surface. Recommend SNF at discharge to focus on mentioned deficits. OT will continue to follow patient acutely.       Recommendations for follow up therapy are one component of a multi-disciplinary discharge planning process, led by the attending physician.  Recommendations may be updated based on patient status, additional functional criteria and insurance authorization.   Follow Up Recommendations  Skilled nursing-short term rehab (<3 hours/day)     Assistance Recommended at Discharge Frequent or constant Supervision/Assistance  Patient can return home with the following Two people to help with walking and/or transfers;A lot of help with bathing/dressing/bathroom;Assistance with cooking/housework;Help with stairs or ramp for entrance;Assist for transportation    Functional Status Assessment  Patient has had a recent decline in their  functional status and demonstrates the ability to make significant improvements in function in a reasonable and predictable amount of time.  Equipment Recommendations  Other (comment) (defer to next venue of care)       Precautions / Restrictions Precautions Precautions: Fall Precaution Comments: Watch BP Restrictions Weight Bearing Restrictions: Yes LLE Weight Bearing: Weight bearing as tolerated   BP monitored during session Supine in bed: 168/78 Seated EOB: 153/76    Mobility Bed Mobility Overal bed mobility: Needs Assistance Bed Mobility: Supine to Sit, Sit to Supine, Sit to Sidelying     Supine to sit: +2 for physical assistance, HOB elevated, Mod assist Sit to supine: Max assist, +2 for physical assistance Sit to sidelying: Max assist, +2 for physical assistance General bed mobility comments: physical assist provided to elevate trunk from back of bed with HOB elevated. Increased time needed to scoot towards EOB with verbal cues and visual demonstration provided. Total A x2 to boost to [facility] while supine. Patient Response: Cooperative, Flat affect  Transfers Overall transfer level: Needs assistance Equipment used: Rolling walker (2 wheels) Transfers: Sit to/from Stand, Bed to chair/wheelchair/BSC Sit to Stand: Min assist, +2 physical assistance, +2 safety/equipment, From elevated surface     Step pivot transfers: Max assist, +2 physical assistance, +2 safety/equipment     General transfer comment: dificulty advancing right LE when stepping with RW. Max VC for [employer].      Balance Overall balance assessment: History of Falls, Needs assistance Sitting-balance support: Feet supported, No upper extremity supported Sitting balance-Leahy Scale: Poor     Standing balance support: Reliant on assistive device for balance, Bilateral upper extremity supported, During functional activity Standing balance-Leahy Scale: Poor         ADL either performed or assessed  with clinical judgement  ADL Overall ADL's : Needs assistance/impaired     Grooming: Wash/dry hands;Wash/dry face;Oral care;Minimal assistance;Sitting   Upper Body Bathing: Minimal assistance;Sitting   Lower Body Bathing: Total assistance;Bed level   Upper Body Dressing : Minimal assistance;Sitting   Lower Body Dressing: Total assistance;Bed level   Toilet Transfer: Maximal assistance;BSC/3in1;Rolling walker (2 wheels) Toilet Transfer Details (indicate cue type and reason): simulated to recliner Toileting- Clothing Manipulation and Hygiene: Total assistance;Bed level         Vision Baseline Vision/History: 1 Wears glasses Ability to See in Adequate Light: 1 Impaired Patient Visual Report: No change from baseline Vision Assessment?: No apparent visual deficits            Pertinent Vitals/Pain Pain Assessment Pain Assessment: Faces Faces Pain Scale: Hurts whole lot Pain Location: left hip Pain Descriptors / Indicators: Aching, Grimacing, Guarding Pain Intervention(s): Monitored during session     Hand Dominance Right   Extremity/Trunk Assessment Upper Extremity Assessment Upper Extremity Assessment: RUE deficits/detail;LUE deficits/detail RUE Deficits / Details: Shoulder flexion, IR/er, abduction: 4-/5. A/ROM is WFL in all ranges. decreased gross grasp. LUE Deficits / Details: Shoulder flexion, IR/er, abduction: 4-/5. A/ROM is WFL in all ranges. decreased gross grasp.   Lower Extremity Assessment Lower Extremity Assessment: Defer to PT evaluation   Cervical / Trunk Assessment Cervical / Trunk Assessment: Kyphotic   Communication Communication Communication: No difficulties   Cognition Arousal/Alertness: Awake/alert Behavior During Therapy: Flat affect Overall Cognitive Status: History of cognitive impairments - at baseline     General Comments: Pt presents with difficulty providing PLOF. Information taken from chart review. Max difficulty following one step  commands requiring physical assist, verbal cues, and visual cues.                Home Living Family/patient expects to be discharged to:: Skilled nursing facility Living Arrangements: Children Available Help at Discharge: Family;Available 24 hours/day Type of Home: House Home Access: Ramped entrance     Home Layout: One level     Bathroom Shower/Tub: Tub/shower unit;Sponge bathes at baseline   Bathroom Toilet: Standard     Home Equipment: Conservation officer, nature (2 wheels)          Prior Functioning/Environment Prior Level of Function : Independent/Modified Independent       Mobility Comments: pt walks with RW (per chart review) ADLs Comments: performs ADLs without assist (per chart review)        OT Problem List: Decreased strength;Pain;Decreased activity tolerance;Decreased safety awareness;Impaired balance (sitting and/or standing);Decreased knowledge of use of DME or AE      OT Treatment/Interventions: Self-care/ADL training;Therapeutic exercise;Therapeutic activities;Neuromuscular education;Visual/perceptual remediation/compensation;Energy conservation;DME and/or AE instruction;Patient/family education;Manual therapy;Balance training;Modalities    OT Goals(Current goals can be found in the care plan section) Acute Rehab OT Goals Patient Stated Goal: to sit down OT Goal Formulation: Patient unable to participate in goal setting Time For Goal Achievement: 05/11/22 Potential to Achieve Goals: Good  OT Frequency: Min 2X/week    Co-evaluation PT/OT/SLP Co-Evaluation/Treatment: Yes Reason for Co-Treatment: To address functional/ADL transfers   OT goals addressed during session: ADL's and self-care;Proper use of Adaptive equipment and DME;Strengthening/ROM      AM-PAC OT "6 Clicks" Daily Activity     Outcome Measure Help from another person eating meals?: A Little Help from another person taking care of personal grooming?: A Little Help from another person  toileting, which includes using toliet, bedpan, or urinal?: Total Help from another person bathing (including washing, rinsing, drying)?: A Lot Help from another person to  put on and taking off regular upper body clothing?: A Little Help from another person to put on and taking off regular lower body clothing?: Total 6 Click Score: 13   End of Session Equipment Utilized During Treatment: Gait belt;Rolling walker (2 wheels)  Activity Tolerance: Patient tolerated treatment well;Patient limited by pain Patient left: in bed;with call bell/phone within reach;with bed alarm set;with family/visitor present;with nursing/sitter in room  OT Visit Diagnosis: History of falling (Z91.81);Muscle weakness (generalized) (M62.81)                Time: 2003-7944 OT Time Calculation (min): 37 min Charges:  OT General Charges $OT Visit: 1 Visit OT Evaluation $OT Eval High Complexity: 1 High  [employer], OTR/L,CBIS  Supplemental OT - [facility] and [facility]   Veera Stapleton, Clarene Duke 04/28/2022, 10:24 AM

## 2022-04-28 NOTE — Care Management Important Message (Signed)
Important Message  Patient Details  Name: Brandy Gonzales MRN: [id number] Date of Birth: 1941-01-05   Medicare Important Message Given:  Yes     Orbie Pyo 04/28/2022, 3:28 PM

## 2022-04-28 NOTE — Progress Notes (Signed)
Physical Therapy Treatment Patient Details Name: Brandy Gonzales MRN: [id number] DOB: 01-Jun-1940 Today's Date: 04/28/2022   History of Present Illness 81 yo admitted 12/16 after fall with left hip fx s/p IM nail 12/17. PMhx; dementia, GERD, glaucoma, syncope, HTN, DM    PT Comments    Pt greeted supine in bed and agreeable to session. Pt continues to be limited by pain, decreased activity tolerance, impaired balance/postural reactions. Pt needing grossly max assist +2 for all mobility and transfers. Pt with very poor standing tolerance with pt trying to prematurely come to sit during step pivot transfer needing max multimodal cues to remain standing and total assist +2 to complete transfer bed<>chair. Pt family members present and supportive throughout session. Current plan remains appropriate to address deficits and maximize functional independence and decrease caregiver burden. Pt continues to benefit from skilled PT services to progress toward functional mobility goals.    Recommendations for follow up therapy are one component of a multi-disciplinary discharge planning process, led by the attending physician.  Recommendations may be updated based on patient status, additional functional criteria and insurance authorization.  Follow Up Recommendations  Skilled nursing-short term rehab (<3 hours/day) Can patient physically be transported by private vehicle: No   Assistance Recommended at Discharge Frequent or constant Supervision/Assistance  Patient can return home with the following Two people to help with walking and/or transfers;Assistance with cooking/housework;Direct supervision/assist for medications management;Assist for transportation;Help with stairs or ramp for entrance;A lot of help with bathing/dressing/bathroom   Equipment Recommendations  BSC/3in1    Recommendations for Other Services       Precautions / Restrictions Precautions Precautions: Fall Precaution Comments: Watch  BP Restrictions Weight Bearing Restrictions: Yes LLE Weight Bearing: Weight bearing as tolerated     Mobility  Bed Mobility Overal bed mobility: Needs Assistance Bed Mobility: Supine to Sit, Sit to Supine, Sit to Sidelying     Supine to sit: +2 for physical assistance, HOB elevated, Mod assist Sit to supine: Max assist, +2 for physical assistance Sit to sidelying: Max assist, +2 for physical assistance General bed mobility comments: physical assist provided to elevate trunk from back of bed with HOB elevated. Increased time needed to scoot towards EOB with verbal cues and visual demonstration provided. Total A x2 to boost to [facility] while supine. Patient Response: Cooperative, Flat affect  Transfers Overall transfer level: Needs assistance Equipment used: Rolling walker (2 wheels) Transfers: Sit to/from Stand, Bed to chair/wheelchair/BSC Sit to Stand: Min assist, +2 physical assistance, +2 safety/equipment, From elevated surface   Step pivot transfers: Max assist, +2 physical assistance, +2 safety/equipment       General transfer comment: dificulty advancing right LE when stepping with RW. Max VC for [employer].    Ambulation/Gait                   Stairs             Wheelchair Mobility    Modified Rankin (Stroke Patients Only)       Balance Overall balance assessment: History of Falls, Needs assistance Sitting-balance support: Feet supported, No upper extremity supported Sitting balance-Leahy Scale: Poor     Standing balance support: Reliant on assistive device for balance, Bilateral upper extremity supported, During functional activity Standing balance-Leahy Scale: Poor                              Cognition Arousal/Alertness: Awake/alert Behavior During Therapy: Flat affect  Overall Cognitive Status: History of cognitive impairments - at baseline                                 General Comments: Pt presents with  difficulty providing PLOF. Information taken from chart review. Max difficulty following one step commands requiring physical assist, verbal cues, and visual cues.        Exercises      General Comments        Pertinent Vitals/Pain Pain Assessment Pain Assessment: Faces Faces Pain Scale: Hurts whole lot Pain Location: left hip Pain Descriptors / Indicators: Aching, Grimacing, Guarding Pain Intervention(s): Monitored during session, Limited activity within patient's tolerance, Repositioned    Home Living                          Prior Function            PT Goals (current goals can now be found in the care plan section) Acute Rehab PT Goals PT Goal Formulation: With patient/family Time For Goal Achievement: 05/11/22 Progress towards PT goals: Progressing toward goals    Frequency    Min 4X/week      PT Plan      Co-evaluation PT/OT/SLP Co-Evaluation/Treatment: Yes Reason for Co-Treatment: Complexity of the patient's impairments (multi-system involvement);For patient/therapist safety;To address functional/ADL transfers PT goals addressed during session: Mobility/safety with mobility;Proper use of DME;Strengthening/ROM        AM-PAC PT "6 Clicks" Mobility   Outcome Measure  Help needed turning from your back to your side while in a flat bed without using bedrails?: A Lot Help needed moving from lying on your back to sitting on the side of a flat bed without using bedrails?: A Lot Help needed moving to and from a bed to a chair (including a wheelchair)?: Total Help needed standing up from a chair using your arms (e.g., wheelchair or bedside chair)?: Total Help needed to walk in hospital room?: Total Help needed climbing 3-5 steps with a railing? : Total 6 Click Score: 8    End of Session Equipment Utilized During Treatment: Gait belt Activity Tolerance: Patient limited by pain;Patient tolerated treatment well Patient left: in bed;with call  bell/phone within reach;with bed alarm set;with nursing/sitter in room;with family/visitor present Nurse Communication: Mobility status PT Visit Diagnosis: Other abnormalities of gait and mobility (R26.89);Muscle weakness (generalized) (M62.81);History of falling (Z91.81)     Time: 6301-6010 PT Time Calculation (min) (ACUTE ONLY): 39 min  Charges:  $Therapeutic Activity: 8-22 mins                     Hadley Detloff R. PTA Acute Rehabilitation Services Office: [place] 04/28/2022, 4:28 PM

## 2022-04-28 NOTE — TOC Initial Note (Signed)
Transition of Care [facility]) - Initial/Assessment Note    Patient Details  Name: Brandy Gonzales MRN: [id number] Date of Birth: August 18, 1940  [facility] CM/SW Contact:    Pollie Friar, RN Phone Number: 04/28/2022, 1:19 PM  Clinical Narrative:          Therapies have seen patient and are recommending SNF rehab.         CM met with the patient and her son at the bedside. Pt was sleeping during visit. Son is interested in SNF rehab post hospitalization. He was in agreement with having her faxed out in the [facility] area. CM will provide bed offers once they are available.  TOC following.   Expected Discharge Plan: Skilled Nursing Facility Barriers to Discharge: Continued Medical Work up   Patient Goals and CMS Choice   CMS Medicare.gov Compare Post Acute Care list provided to:: Patient Represenative (must comment) Choice offered to / list presented to : Adult Children  Expected Discharge Plan and Services Expected Discharge Plan: Skilled Nursing Facility In-house Referral: Clinical Social Work Discharge Planning Services: CM Consult Post Acute Care Choice: [place] arrangements for the past 2 months: [place]                                      Prior Living Arrangements/Services Living arrangements for the past 2 months: Single Family Home Lives with:: Adult Children Patient language and need for interpreter reviewed:: Yes Do you feel safe going back to the place where you live?: Yes            Criminal Activity/Legal Involvement Pertinent to Current Situation/Hospitalization: No - Comment as needed  Activities of Daily Living Home Assistive Devices/Equipment: Environmental consultant (specify type), Cane (specify quad or straight) ADL Screening (condition at time of admission) Patient's cognitive ability adequate to safely complete daily activities?: Yes Is the patient deaf or have difficulty hearing?: No Does the patient have  difficulty seeing, even when wearing glasses/contacts?: No Does the patient have difficulty concentrating, remembering, or making decisions?: No Patient able to express need for assistance with ADLs?: Yes Does the patient have difficulty dressing or bathing?: Yes Independently performs ADLs?: No Communication: Independent Dressing (OT): Needs assistance Is this a change from baseline?: Pre-admission baseline Grooming: Independent Feeding: Independent Bathing: Needs assistance Is this a change from baseline?: Pre-admission baseline Toileting: Needs assistance Is this a change from baseline?: Pre-admission baseline In/Out Bed: Needs assistance Is this a change from baseline?: Pre-admission baseline Walks in Home: Needs assistance Is this a change from baseline?: Pre-admission baseline Does the patient have difficulty walking or climbing stairs?: Yes Weakness of Legs: Left Weakness of Arms/Hands: None  Permission Sought/Granted                  Emotional Assessment Appearance:: Appears stated age         Psych Involvement: No (comment)  Admission diagnosis:  Closed fracture of left hip, initial encounter ([place]) [S72.002A] Closed left hip fracture, initial encounter ([place]) [S72.002A] Patient Active Problem List   Diagnosis Date Noted   Closed left hip fracture, initial encounter ([place]) 04/25/2022   Memory loss 12/10/2021   Bell palsy 12/10/2021   Acute bronchitis 10/21/2021   Dyslipidemia 04/11/2020   Abdominal enlargement 04/05/2018   Stage 3b chronic kidney disease (CKD) ([place]) 11/30/2017   Leg wound, right 08/08/2015   Gastroenteritis, acute 04/22/2015  OAB (overactive bladder) 04/22/2015   Leg wound, left 11/01/2014   Edema 11/01/2014   Seborrheic dermatitis 09/26/2013   Dysphagia, unspecified(787.20) 01/23/2013   Syncope 10/03/2012   Well adult exam 09/22/2012   Weight gain 09/22/2012   Cystitis 09/22/2012   Nonspecific abnormal electrocardiogram (ECG) (EKG)  09/22/2012   Osteoarthritis 05/19/2012   Leg pain, bilateral 01/13/2012   Chest pain 09/10/2011   Glaucoma 09/10/2011   Dysuria 07/17/2009   Diabetic nephropathy ([place]) 06/27/2008   Controlled type 2 diabetes mellitus with diabetic nephropathy ([place]) 04/27/2007   Essential hypertension 04/27/2007   GERD 04/27/2007   PCP:  Cassandria Anger, MD Pharmacy:   [facility] [place], [place] AT [address] [place] [address] Phone: [phone] Fax: [phone]     Social Determinants of Health (SDOH) Interventions    Readmission Risk Interventions     No data to display

## 2022-04-28 NOTE — NC FL2 (Signed)
[place] LEVEL OF CARE FORM     IDENTIFICATION  Patient Name: MALKY RUDZINSKI Birthdate: [birth year] Sex: female Admission Date (Current Location): 04/25/2022  [facility] [address] and [place] Number:  Herbalist and Address:  [facility] [place]. [facility], [place] [address], [place], [place] [address]      Provider Number: [id number]  Attending Physician Name and Address:  Shawna Clamp, MD  Relative Name and Phone Number:       Current Level of Care: Hospital Recommended Level of Care: [place] Prior Approval Number:    Date Approved/Denied:   PASRR Number: [id number]  Discharge Plan: SNF    Current Diagnoses: Patient Active Problem List   Diagnosis Date Noted   Closed left hip fracture, initial encounter ([place]) 04/25/2022   Memory loss 12/10/2021   Bell palsy 12/10/2021   Acute bronchitis 10/21/2021   Dyslipidemia 04/11/2020   Abdominal enlargement 04/05/2018   Stage 3b chronic kidney disease (CKD) ([place]) 11/30/2017   Leg wound, right 08/08/2015   Gastroenteritis, acute 04/22/2015   OAB (overactive bladder) 04/22/2015   Leg wound, left 11/01/2014   Edema 11/01/2014   Seborrheic dermatitis 09/26/2013   Dysphagia, unspecified(787.20) 01/23/2013   Syncope 10/03/2012   Well adult exam 09/22/2012   Weight gain 09/22/2012   Cystitis 09/22/2012   Nonspecific abnormal electrocardiogram (ECG) (EKG) 09/22/2012   Osteoarthritis 05/19/2012   Leg pain, bilateral 01/13/2012   Chest pain 09/10/2011   Glaucoma 09/10/2011   Dysuria 07/17/2009   Diabetic nephropathy ([place]) 06/27/2008   Controlled type 2 diabetes mellitus with diabetic nephropathy ([place]) 04/27/2007   Essential hypertension 04/27/2007   GERD 04/27/2007    Orientation RESPIRATION BLADDER Height & Weight     Self, Time, Situation, Place  Normal Incontinent Weight: 59.4 kg Height:     BEHAVIORAL SYMPTOMS/MOOD NEUROLOGICAL BOWEL NUTRITION STATUS      Continent Diet (Regular  with thin liquids)  AMBULATORY STATUS COMMUNICATION OF NEEDS Skin   Extensive Assist Verbally Surgical wounds, Skin abrasions (Lt hip surgery incision/ lt shin with laceration)                       Personal Care Assistance Level of Assistance  Bathing, Feeding, Dressing Bathing Assistance: Limited assistance Feeding assistance: Independent Dressing Assistance: Limited assistance     Functional Limitations Info  Sight, Hearing, Speech Sight Info: Impaired Hearing Info: Adequate Speech Info: Adequate    SPECIAL CARE FACTORS FREQUENCY  PT (By licensed PT), OT (By licensed OT)     PT Frequency: 5x/wk OT Frequency: 5x/wk            Contractures Contractures Info: Not present    Additional Factors Info  Code Status, Allergies, Insulin Sliding Scale Code Status Info: Full Allergies Info: Amlodipine/ atenolol/ Enalapril/ oxycodone/ porpoxyphene-acetaminophen   Insulin Sliding Scale Info: Novolog 0-6 units SQ every 4 hours       Current Medications (04/28/2022):  This is the current hospital active medication list Current Facility-Administered Medications  Medication Dose Route Frequency Provider Last Rate Last Admin   0.9 %  sodium chloride infusion   Intravenous Continuous Donnetta Simpers, MD 100 mL/hr at 04/28/22 0841 1,000 mL at 04/28/22 0841   acetaminophen (TYLENOL) tablet 325-650 mg  325-650 mg Oral Q6H PRN Georgeanna Harrison, MD       acetaminophen (TYLENOL) tablet 650 mg  650 mg Oral Q6H PRN Vianne Bulls, MD   650 mg at 04/28/22 1038   carvedilol (  COREG) tablet 25 mg  25 mg Oral BID WC Opyd, Ilene Qua, MD   25 mg at 04/28/22 0831   cyanocobalamin (VITAMIN B12) injection 1,000 mcg  1,000 mcg Intramuscular Daily Donnetta Simpers, MD   1,000 mcg at 04/28/22 1043   Followed by   Derrill Memo ON 05/02/2022] cyanocobalamin (VITAMIN B12) tablet 1,000 mcg  1,000 mcg Oral Daily Donnetta Simpers, MD       docusate sodium (COLACE) capsule 100 mg  100 mg Oral BID Georgeanna Harrison, MD   100 mg at 04/28/22 1040   enoxaparin (LOVENOX) injection 40 mg  40 mg Subcutaneous Q24H Georgeanna Harrison, MD   40 mg at 04/28/22 1039   fentaNYL (SUBLIMAZE) injection 25-50 mcg  25-50 mcg Intravenous Q2H PRN Opyd, Ilene Qua, MD       folic acid (FOLVITE) tablet 1 mg  1 mg Oral Daily Donnetta Simpers, MD   1 mg at 60/45/40 9811   Or   folic acid injection 1 mg  1 mg Intravenous Daily Donnetta Simpers, MD       HYDROcodone-acetaminophen (NORCO) 7.5-325 MG per tablet 1-2 tablet  1-2 tablet Oral Q4H PRN Georgeanna Harrison, MD   1 tablet at 04/27/22 1151   HYDROcodone-acetaminophen (NORCO/VICODIN) 5-325 MG per tablet 1-2 tablet  1-2 tablet Oral Q4H PRN Georgeanna Harrison, MD   1 tablet at 04/26/22 2238   insulin aspart (novoLOG) injection 0-6 Units  0-6 Units Subcutaneous Q4H Opyd, Ilene Qua, MD   1 Units at 04/28/22 1218   labetalol (NORMODYNE) injection 10 mg  10 mg Intravenous Q2H PRN Opyd, Ilene Qua, MD       menthol-cetylpyridinium (CEPACOL) lozenge 3 mg  1 lozenge Oral PRN Georgeanna Harrison, MD       Or   phenol (CHLORASEPTIC) mouth spray 1 spray  1 spray Mouth/Throat PRN Georgeanna Harrison, MD       metoCLOPramide (REGLAN) tablet 5-10 mg  5-10 mg Oral Q8H PRN Georgeanna Harrison, MD       Or   metoCLOPramide (REGLAN) injection 5-10 mg  5-10 mg Intravenous Q8H PRN Georgeanna Harrison, MD       morphine (PF) 2 MG/ML injection 0.5-1 mg  0.5-1 mg Intravenous Q2H PRN Georgeanna Harrison, MD       mupirocin ointment (BACTROBAN) 2 % 1 Application  1 Application Nasal BID Opyd, Ilene Qua, MD   1 Application at 91/47/82 1217   ondansetron (ZOFRAN) injection 4 mg  4 mg Intravenous Q6H PRN Opyd, Ilene Qua, MD       ondansetron (ZOFRAN) tablet 4 mg  4 mg Oral Q6H PRN Georgeanna Harrison, MD       Or   ondansetron [facility]) injection 4 mg  4 mg Intravenous Q6H PRN Georgeanna Harrison, MD       senna-docusate (Senokot-S) tablet 1 tablet  1 tablet Oral QHS PRN Opyd, Ilene Qua, MD       thiamine (VITAMIN B1) injection 100 mg  100 mg  Intravenous Daily Donnetta Simpers, MD   100 mg at 04/27/22 2154   Or   thiamine (VITAMIN B1) tablet 100 mg  100 mg Oral Daily Donnetta Simpers, MD   100 mg at 04/28/22 1040     Discharge Medications: Please see discharge summary for a list of discharge medications.  Relevant Imaging Results:  Relevant Lab Results:   Additional Information SS#: [id number]  Pollie Friar, RN

## 2022-04-28 NOTE — Plan of Care (Signed)
Brief Neuro Update:  rEEG and MRI Brain are negative. She does have low Folate and farily low B12 levels from a neuro standpoint. I started her on replacement. I would recommend that she be discharged on PO Folic acid and Vit C09. Likely episode yesterday was more encephalopathy rather than aphasia.  Recs: - no further inpatient neurological recommendation at this time. We will signoff.  [place] Pager Number [phone]

## 2022-04-29 ENCOUNTER — Encounter (HOSPITAL_COMMUNITY): Payer: Self-pay | Admitting: Orthopedic Surgery

## 2022-04-29 DIAGNOSIS — S72002A Fracture of unspecified part of neck of left femur, initial encounter for closed fracture: Secondary | ICD-10-CM | POA: Diagnosis not present

## 2022-04-29 LAB — BASIC METABOLIC PANEL
Anion gap: 7 (ref 5–15)
BUN: 21 mg/dL (ref 8–23)
CO2: 21 mmol/L — ABNORMAL LOW (ref 22–32)
Calcium: 8.6 mg/dL — ABNORMAL LOW (ref 8.9–10.3)
Chloride: 112 mmol/L — ABNORMAL HIGH (ref 98–111)
Creatinine, Ser: 1.43 mg/dL — ABNORMAL HIGH (ref 0.44–1.00)
GFR, Estimated: 37 mL/min — ABNORMAL LOW (ref >60)
Glucose, Bld: 153 mg/dL — ABNORMAL HIGH (ref 70–99)
Potassium: 3.5 mmol/L (ref 3.5–5.1)
Sodium: 140 mmol/L (ref 135–145)

## 2022-04-29 LAB — CBC
HCT: 28.4 % — ABNORMAL LOW (ref 36.0–46.0)
Hemoglobin: 9.7 g/dL — ABNORMAL LOW (ref 12.0–15.0)
MCH: 30.9 pg (ref 26.0–34.0)
MCHC: 34.2 g/dL (ref 30.0–36.0)
MCV: 90.4 fL (ref 80.0–100.0)
Platelets: 135 10*3/uL — ABNORMAL LOW (ref 150–400)
RBC: 3.14 MIL/uL — ABNORMAL LOW (ref 3.87–5.11)
RDW: 14.6 % (ref 11.5–15.5)
WBC: 7 10*3/uL (ref 4.0–10.5)
nRBC: 0 % (ref 0.0–0.2)

## 2022-04-29 LAB — GLUCOSE, CAPILLARY
Glucose-Capillary: 132 mg/dL — ABNORMAL HIGH (ref 70–99)
Glucose-Capillary: 149 mg/dL — ABNORMAL HIGH (ref 70–99)
Glucose-Capillary: 156 mg/dL — ABNORMAL HIGH (ref 70–99)
Glucose-Capillary: 201 mg/dL — ABNORMAL HIGH (ref 70–99)
Glucose-Capillary: 207 mg/dL — ABNORMAL HIGH (ref 70–99)
Glucose-Capillary: 214 mg/dL — ABNORMAL HIGH (ref 70–99)
Glucose-Capillary: 229 mg/dL — ABNORMAL HIGH (ref 70–99)

## 2022-04-29 MED ORDER — INSULIN ASPART 100 UNIT/ML IJ SOLN
0.0000 [IU] | Freq: Three times a day (TID) | INTRAMUSCULAR | Status: DC
  Administered 2022-04-29: 3 [IU] via SUBCUTANEOUS
  Administered 2022-04-30: 2 [IU] via SUBCUTANEOUS
  Administered 2022-04-30: 3 [IU] via SUBCUTANEOUS
  Administered 2022-04-30: 5 [IU] via SUBCUTANEOUS
  Administered 2022-05-01 – 2022-05-02 (×4): 2 [IU] via SUBCUTANEOUS
  Administered 2022-05-02: 5 [IU] via SUBCUTANEOUS
  Administered 2022-05-02: 1 [IU] via SUBCUTANEOUS
  Administered 2022-05-03: 7 [IU] via SUBCUTANEOUS
  Administered 2022-05-03 – 2022-05-04 (×2): 1 [IU] via SUBCUTANEOUS
  Administered 2022-05-04 – 2022-05-05 (×3): 3 [IU] via SUBCUTANEOUS
  Administered 2022-05-05: 1 [IU] via SUBCUTANEOUS
  Administered 2022-05-05: 2 [IU] via SUBCUTANEOUS
  Administered 2022-05-06: 1 [IU] via SUBCUTANEOUS

## 2022-04-29 MED ORDER — INSULIN ASPART 100 UNIT/ML IJ SOLN
0.0000 [IU] | Freq: Every day | INTRAMUSCULAR | Status: DC
  Administered 2022-04-29 – 2022-04-30 (×2): 2 [IU] via SUBCUTANEOUS

## 2022-04-29 NOTE — Progress Notes (Addendum)
PROGRESS NOTE    Brandy Gonzales  WPV:[id number] DOB: October 12, 1940 DOA: 04/25/2022 PCP: Cassandria Anger, MD    Brief Narrative:   Brandy Gonzales is a 81 y.o. female with past medical history significant for HTN, DM2, CKD stage IIIb who presented to [facility] ED on 12/16 with complaints of left hip pain following fall at home.  Patient reports she was unable to bear weight on her left leg due to severe hip pain.  Workup in the ED notable for comminuted left femoral intertrochanteric fracture.  Orthopedics was consulted and patient underwent ORIF by Dr. Mable Fill on 12/17.  On postoperative day #2, while working with physical therapy patient had a brief syncopal episode; code stroke was initially activated in which patient underwent EEG and MRI testing which ruled out acute stroke and neurology has now signed off.  Patient medically stable for discharge pending SNF bed.  Assessment & Plan:   Left femoral intertrochanteric fracture Patient presenting to ED following mechanical fall at home with inability to bear weight secondary to severe hip pain.  Workup in the ED with imaging notable for comminuted left femoral intra-articular fracture.  Orthopedics was consulted and patient underwent ORIF on 12/17. -- WBAT LLE -- Postoperative DVT prophylaxis with Lovenox 40 mg daily x 6 weeks -- Norco as needed moderate/severe pain -- Continue PT/OT efforts while inpatient. -- Outpatient follow-up with orthopedics 2 weeks -- Pending SNF placement  Syncope On postoperative day #2, patient had syncopal episode while working with physical therapy.  Code stroke was initially activated with CT head, MRI and EEG all unremarkable.  No arrhythmias were reported on telemetry.  No further events since.  B12/folate deficiency Folate 5.9, vitamin Z48 270. -- Folic acid 1 mg p.o. daily -- Vitamin B12 1000 mcg daily  Essential hypertension --Carvedilol 25 mg p.o. twice daily  Type 2 diabetes mellitus Hemoglobin A1c 7.17 April 2022, not optimally controlled.  Home regimen includes Amaryl 2 mg p.o. daily, Farxiga 10 mg p.o. daily. --sensitive SSI for coverage --CBGs qAC/HS  CKD stage IIIb Stable, baseline creatinine 1.2-1.4.   DVT prophylaxis: enoxaparin (LOVENOX) injection 40 mg Start: 04/27/22 1000 SCDs Start: 04/26/22 1451 SCDs Start: 04/25/22 2015    Code Status: Full Code Family Communication: Updated family present at bedside this morning  Disposition Plan:  Level of care: Progressive Status is: Inpatient Remains inpatient appropriate because: Medically stable for discharge once SNF bed available.    Consultants:  Orthopedics, Dr. Mable Fill Neurology  Procedures:  EEG  Antimicrobials:  Perioperative vancomycin/cefazolin   Subjective: Patient seen examined bedside, resting comfortably.  Lying in bed.  Family present.  No complaints.  Awaiting SNF placement.  Medically stable for discharge once bed available.  Denies headache, no dizziness, no chest pain, no palpitations, no shortness of breath, no abdominal pain, no focal weakness, no fatigue, no paresthesias.  No acute events overnight per nurse staff.  Objective: Vitals:   04/29/22 0018 04/29/22 0346 04/29/22 0600 04/29/22 1120  BP: (!) 165/77 (!) 164/67  (!) 138/58  Pulse: 70 80  66  Resp: 12 16  17   Temp: 98.6 F (37 C) 98.4 F (36.9 C)  (!) 97.4 F (36.3 C)  TempSrc: Axillary Axillary  Oral  SpO2: 99% 98%  100%  Weight:   61.3 kg     Intake/Output Summary (Last 24 hours) at 04/29/2022 1315 Last data filed at 04/29/2022 0300 Gross per 24 hour  Intake 1118.31 ml  Output 700 ml  Net 418.31 ml  Filed Weights   04/25/22 2340 04/28/22 0621 04/29/22 0600  Weight: 51.9 kg 59.4 kg 61.3 kg    Examination:  Physical Exam: GEN: NAD, alert and oriented x 3, wd/wn HEENT: NCAT, PERRL, EOMI, sclera clear, MMM PULM: CTAB w/o wheezes/crackles, normal respiratory effort, on room air CV: RRR w/o M/G/R GI: abd soft, NTND,  NABS, no R/G/M MSK: no peripheral edema, muscle strength globally intact 5/5 bilateral upper/lower extremities neurovascularly intact, surgical dressing noted left hip clean/dry/intact NEURO: CN II-XI intact, no focal deficits, sensation to light touch intact PSYCH: normal mood/affect Integumentary: dry/intact, no rashes or wounds    Data Reviewed: I have personally reviewed following labs and imaging studies  CBC: Recent Labs  Lab 04/25/22 1857 04/26/22 0136 04/27/22 0311 04/28/22 0350 04/29/22 0345  WBC 6.6 7.4 9.8 9.0 7.0  NEUTROABS 5.2  --   --   --   --   HGB 12.1 11.8* 10.2* 10.0* 9.7*  HCT 36.8 34.7* 29.4* 29.7* 28.4*  MCV 91.1 89.0 89.1 89.2 90.4  PLT 173 158 126* 146* 267*   Basic Metabolic Panel: Recent Labs  Lab 04/25/22 1857 04/26/22 0234 04/27/22 0311 04/28/22 0350 04/29/22 0345  NA 143 143 138 139 140  K 3.6 3.7 3.6 3.6 3.5  CL 109 110 107 104 112*  CO2 27 23 23  21* 21*  GLUCOSE 244* 165* 142* 162* 153*  BUN 14 12 16 20 21   CREATININE 1.46* 1.25* 1.48* 1.68* 1.43*  CALCIUM 9.7 9.7 9.1 9.3 8.6*  MG  --   --   --  1.8  --   PHOS  --   --   --  3.3  --    GFR: Estimated Creatinine Clearance: 24.1 mL/min (A) (by C-G formula based on SCr of 1.43 mg/dL (H)). Liver Function Tests: Recent Labs  Lab 04/22/22 1440 04/25/22 1857  AST 29 30  ALT 29 31  ALKPHOS 128* 117  BILITOT 1.5* 1.1  PROT 6.5 5.8*  ALBUMIN 4.0 3.5   No results for input(s): "LIPASE", "AMYLASE" in the last 168 hours. No results for input(s): "AMMONIA" in the last 168 hours. Coagulation Profile: No results for input(s): "INR", "PROTIME" in the last 168 hours. Cardiac Enzymes: No results for input(s): "CKTOTAL", "CKMB", "CKMBINDEX", "TROPONINI" in the last 168 hours. BNP (last 3 results) No results for input(s): "PROBNP" in the last 8760 hours. HbA1C: No results for input(s): "HGBA1C" in the last 72 hours. CBG: Recent Labs  Lab 04/28/22 1950 04/29/22 0017 04/29/22 0343  04/29/22 0624 04/29/22 1125  GLUCAP 289* 132* 149* 156* 214*   Lipid Profile: No results for input(s): "CHOL", "HDL", "LDLCALC", "TRIG", "CHOLHDL", "LDLDIRECT" in the last 72 hours. Thyroid Function Tests: Recent Labs    04/27/22 1328  TSH 1.460   Anemia Panel: Recent Labs    04/27/22 1328  VITAMINB12 208  FOLATE 5.9*   Sepsis Labs: No results for input(s): "PROCALCITON", "LATICACIDVEN" in the last 168 hours.  Recent Results (from the past 240 hour(s))  Surgical PCR screen     Status: None   Collection Time: 04/25/22 11:56 PM   Specimen: Nasal Mucosa; Nasal Swab  Result Value Ref Range Status   MRSA, PCR NEGATIVE NEGATIVE Final   Staphylococcus aureus NEGATIVE NEGATIVE Final    Comment: (NOTE) The Xpert SA Assay (FDA approved for NASAL specimens in patients 41 years of age and older), is one component of a comprehensive surveillance program. It is not intended to diagnose infection nor to guide or monitor treatment.  Performed at [facility], [place] [address] [place], [place] [address]          Radiology Studies: MR BRAIN WO CONTRAST  Result Date: 04/27/2022 CLINICAL DATA:  Altered mental status EXAM: MRI HEAD WITHOUT CONTRAST TECHNIQUE: Multiplanar, multiecho pulse sequences of the brain and surrounding structures were obtained without intravenous contrast. COMPARISON:  Same-day CT/CTA head and neck, brain MRI 10/17/2021 FINDINGS: Image quality is intermittently motion degraded. Specifically, the axial FLAIR sequence is moderately motion degraded. Brain: There is no evidence of acute intracranial hemorrhage, extra-axial fluid collection, or acute territorial infarct. Tiny foci of SWI signal abnormality in the high right frontoparietal region are favored artifactual. There is unchanged parenchymal volume loss with prominence of the ventricular system and extra-axial CSF spaces. There is extensive patchy and confluence FLAIR signal abnormality throughout the  supratentorial brain consistent with advanced chronic small-vessel ischemic change. Encephalomalacia in the left frontal lobe consistent with prior infarct is unchanged. Small remote infarcts in the bilateral cerebellar hemispheres are unchanged. Old hemorrhage in the bilateral parietal lobes is unchanged likely associated with prior infarcts There is no mass lesion.  There is no mass effect or midline shift. Vascular: Normal flow voids. Skull and upper cervical spine: Normal marrow signal. Sinuses/Orbits: The paranasal sinuses are clear. Bilateral lens implants are in place. The globes and orbits are otherwise unremarkable. Other: None. IMPRESSION: 1. No definite evidence of acute intracranial pathology, within the confines of motion degraded images. 2. Stable chronic findings as above. Electronically Signed   By: Valetta Mole M.D.   On: 04/27/2022 21:37   EEG adult  Result Date: 04/27/2022 Lora Havens, MD     04/27/2022  2:48 PM Patient Name: Brandy Gonzales MRN: [id number] Epilepsy Attending: Lora Havens Referring Physician/Provider: Donnetta Simpers, MD Date: 04/27/2022 Duration: 22.03 mins Patient history: 81 year old female with presyncopal episode this morning now with aphasia.  EEG to evaluate for seizure. Level of alertness: Awake AEDs during EEG study: None Technical aspects: This EEG study was done with scalp electrodes positioned according to the 10-20 International system of electrode placement. Electrical activity was reviewed with band pass filter of 1-70Hz , sensitivity of 7 uV/mm, display speed of 35mm/sec with a 60Hz  notched filter applied as appropriate. EEG data were recorded continuously and digitally stored.  Video monitoring was available and reviewed as appropriate. Description: No clear posterior dominant rhythm was seen.  EEG showed continuous generalized predominantly 5 to 7 Hz theta slowing admixed with intermittent generalized 2 to 3 Hz delta slowing. Hyperventilation and  photic stimulation were not performed.   ABNORMALITY - Continuous slow, generalized IMPRESSION: This study is suggestive of moderate diffuse encephalopathy, nonspecific etiology. No seizures or epileptiform discharges were seen throughout the recording. Priyanka Barbra Sarks        Scheduled Meds:  carvedilol  25 mg Oral BID WC   cyanocobalamin  1,000 mcg Intramuscular Daily   Followed by   Derrill Memo ON 05/02/2022] vitamin B-12  1,000 mcg Oral Daily   docusate sodium  100 mg Oral BID   enoxaparin (LOVENOX) injection  40 mg Subcutaneous Q03K   folic acid  1 mg Oral Daily   Or   folic acid  1 mg Intravenous Daily   insulin aspart  0-6 Units Subcutaneous Q4H   mupirocin ointment  1 Application Nasal BID   thiamine (VITAMIN B1) injection  100 mg Intravenous Daily   Or   thiamine  100 mg Oral Daily   Continuous Infusions:  sodium  chloride 100 mL/hr at 04/28/22 1947     LOS: 4 days    Time spent: 46 minutes spent on chart review, discussion with nursing staff, consultants, updating family and interview/physical exam; more than 50% of that time was spent in counseling and/or coordination of care.    Jennessy Sandridge J [place] DO [facility] Available via Epic secure chat 7am-7pm After these hours, please refer to coverage provider listed on [email or web address] 04/29/2022, 1:15 PM

## 2022-04-29 NOTE — Plan of Care (Signed)
Pt with no syncopal incidents alert oriented not wanting to get out of bed or move the LLE despite education

## 2022-04-29 NOTE — Inpatient Diabetes Management (Signed)
Inpatient Diabetes Program Recommendations  AACE/ADA: New Consensus Statement on Inpatient Glycemic Control (2015)  Target Ranges:  Prepandial:   less than 140 mg/dL      Peak postprandial:   less than 180 mg/dL (1-2 hours)      Critically ill patients:  140 - 180 mg/dL   Lab Results  Component Value Date   GLUCAP 156 (H) 04/29/2022   HGBA1C 7.8 (H) 04/22/2022    Diabetes history: DM 2 Outpatient Diabetes medications:  Amaryl 2 mg with breakfast Farxiga 10 mg daily Current orders for Inpatient glycemic control:  Novolog 0-6 units q 4 hours   Inpatient Diabetes Program Recommendations:     Please consider changing Novolog correction to sensitive (0-9 units) tid with meals and HS scale.  Also consider adding CHO modified to diet.    Will continue to follow while inpatient.  Thank you, Reche Dixon, MSN, [place] Diabetes Coordinator Inpatient Diabetes Program [phone] (team pager from 8a-5p)

## 2022-04-29 NOTE — TOC Progression Note (Signed)
Transition of Care [facility]) - Progression Note    Patient Details  Name: Brandy Gonzales MRN: [id number] Date of Birth: [birth year]  [facility] CM/SW Contact  Pollie Friar, RN Phone Number: 04/29/2022, 3:53 PM  Clinical Narrative:    CM has provided the bed offers to the family. They are asking for [facility]. CM called Magda Paganini at [facility] and she will review and update TOC. [facility] wont have a bed until Friday if they do accept.  TOC following.   Planned Disposition: Skilled Nursing Facility Barriers to Discharge: Continued Medical Work up  Expected Discharge Plan and Services In-house Referral: Clinical Social Work Discharge Planning Services: CM Consult Post Acute Care Choice: [place] Living arrangements for the past 2 months: [place] Determinants of Health (SDOH) Interventions SDOH Screenings   Food Insecurity: No Food Insecurity (04/25/2022)  Housing: Low Risk  (04/25/2022)  Transportation Needs: No Transportation Needs (04/25/2022)  Utilities: Not At Risk (04/25/2022)  Alcohol Screen: Low Risk  (03/24/2021)  Depression (PHQ2-9): Medium Risk (01/21/2022)  Financial Resource Strain: Low Risk  (03/24/2021)  Physical Activity: Inactive (03/24/2021)  Social Connections: Moderately Isolated (03/24/2021)  Stress: No Stress Concern Present (03/24/2021)  Tobacco Use: Low Risk  (04/26/2022)    Readmission Risk Interventions     No data to display

## 2022-04-29 NOTE — Progress Notes (Signed)
Orthopaedics Daily Progress Note   04/29/2022   10:42 AM  Brandy Gonzales is a 81 y.o. female 3 Days Post-Op s/p INTRAMEDULLARY (IM) NAIL INTERTROCHANTERIC  Subjective Pain well controlled.  Denies nausea, vomiting, or fevers.   Objective Vitals:   04/29/22 0018 04/29/22 0346  BP: (!) 165/77 (!) 164/67  Pulse: 70 80  Resp: 12 16  Temp: 98.6 F (37 C) 98.4 F (36.9 C)  SpO2: 99% 98%    Intake/Output Summary (Last 24 hours) at 04/29/2022 1042 Last data filed at 04/29/2022 0300 Gross per 24 hour  Intake 1118.31 ml  Output 700 ml  Net 418.31 ml     Physical Exam LLE: Dressing clean, dry, and intact +DF/PF/EHL SILT SP/DP/T +DP/PT and WWP distally  Assessment 81 y.o. female s/p Procedure(s) (LRB): INTRAMEDULLARY (IM) NAIL INTERTROCHANTERIC (Left)  Plan Mobility: Out of bed with PT/OT Pain control: Continue to wean/titrate to appropriate oral regimen DVT Prophylaxis: Lovenox 40 mg daily x 6 weeks postoperatively Further surgical plans: None LLE: Weightbearing as tolerated, no restrictions Dressing care: Keep AQUACEL on and dry for up to 14 days.  Do not allow surgical area to get wet before that.  Remove AQUACEL dressing after 14 days and allow area to get wet in shower but DO NOT SUBMERGE until wound is evaluated in clinic.  In most cases skin glue is used and no additional dressing is necessary.  Disposition: Per primary team is medically appropriate Follow-up: Please call [place] [phone]) to schedule follow-op appointment for 2 weeks after surgery.  I have verified that my discharge instructions and follow-up information have been entered in the Discharge Navigator in Epic.  These should automatically populate in the AVS.  Please print the AVS in its entirety and ensure that the patient or a responsible party has a complete copy of the AVS before they are discharged.  If there are questions regarding discharge instructions or  follow-up before the AVS is generated, please check the Discharge Navigator before attempting to contact the surgeon/office.  If unsure how to access the Discharge Navigator or the information contained in the Discharge Navigator, or how to generate/print the AVS, please contact the appropriate Nurse, learning disability.   The patient's postoperative prescriptions (e.g. pain medication, anticoagulation) have been printed, signed, and placed in/on the patient's hard chart: Lovenox and Norco    Georgeanna Harrison M.D. Orthopaedic Surgery [facility]

## 2022-04-30 DIAGNOSIS — S72002A Fracture of unspecified part of neck of left femur, initial encounter for closed fracture: Secondary | ICD-10-CM | POA: Diagnosis not present

## 2022-04-30 LAB — GLUCOSE, CAPILLARY
Glucose-Capillary: 160 mg/dL — ABNORMAL HIGH (ref 70–99)
Glucose-Capillary: 204 mg/dL — ABNORMAL HIGH (ref 70–99)
Glucose-Capillary: 263 mg/dL — ABNORMAL HIGH (ref 70–99)
Glucose-Capillary: 233 mg/dL — ABNORMAL HIGH (ref 70–99)

## 2022-04-30 LAB — VITAMIN B1: Vitamin B1 (Thiamine): 82.2 nmol/L (ref 66.5–200.0)

## 2022-04-30 MED ORDER — CYANOCOBALAMIN 1000 MCG PO TABS: 1000.0000 ug | ORAL_TABLET | Freq: Every day | ORAL | Status: DC

## 2022-04-30 MED ORDER — VITAMIN B-1 100 MG PO TABS: 100.0000 mg | ORAL_TABLET | Freq: Every day | ORAL | Status: DC

## 2022-04-30 MED ORDER — FOLIC ACID 1 MG PO TABS: 1.0000 mg | ORAL_TABLET | Freq: Every day | ORAL | Status: DC

## 2022-04-30 NOTE — Discharge Summary (Incomplete)
Physician Discharge Summary  Brandy Gonzales OQH:[id number] DOB: 08/27/1940 DOA: 04/25/2022  PCP: Cassandria Anger, MD  Admit date: 04/25/2022 Discharge date: 04/30/2022  Admitted From: Home Disposition: SNF  Recommendations for Outpatient Follow-up:  Follow up with PCP in 1-2 weeks Follow-up with orthopedics 2 weeks, Dr. Mable Fill for wound check Continue postoperative DVT prophylaxis Lovenox x 6 weeks per orthopedics Started on folic acid and T46 for deficiency  Discharge Condition: Stable CODE STATUS: Full code Diet recommendation: Heart healthy/consistent carb regular diet  History of present illness:  Brandy Gonzales is a 81 y.o. female with past medical history significant for HTN, DM2, CKD stage IIIb who presented to [facility] ED on 12/16 with complaints of left hip pain following fall at home.  Patient reports she was unable to bear weight on her left leg due to severe hip pain.  Workup in the ED notable for comminuted left femoral intertrochanteric fracture.  Orthopedics was consulted and patient underwent ORIF by Dr. Mable Fill on 12/17.  On postoperative day #2, while working with physical therapy patient had a brief syncopal episode; code stroke was initially activated in which patient underwent EEG and MRI testing which ruled out acute stroke and neurology has now signed off.  Patient medically stable for discharge pending SNF bed.   Hospital course:  Left femoral intertrochanteric fracture Patient presenting to ED following mechanical fall at home with inability to bear weight secondary to severe hip pain.  Workup in the ED with imaging notable for comminuted left femoral intra-articular fracture.  Orthopedics was consulted and patient underwent ORIF on 12/17.  As tolerated left lower extremity. Postoperative DVT prophylaxis with Lovenox 40 mg daily x 6 weeks. Norco as needed moderate/severe pain.  Discharging to SNF for further rehabilitation.  Outpatient follow-up with Dr. Mable Fill,  orthopedics 2 weeks.   Syncope On postoperative day #2, patient had syncopal episode while working with physical therapy.  Code stroke was initially activated with CT head, MRI and EEG all unremarkable.  No arrhythmias were reported on telemetry.  No further events since.   B12/folate deficiency Folate 5.9, vitamin B12 208. Folic acid 1 mg p.o. daily, Vitamin B12 1000 mcg daily   Essential hypertension Carvedilol 25 mg p.o. twice daily   Type 2 diabetes mellitus Hemoglobin A1c 7.17 April 2022, not optimally controlled.  Home regimen includes Amaryl 2 mg p.o. daily, Farxiga 10 mg p.o. daily.   CKD stage IIIb Stable, baseline creatinine 1.2-1.4.  Discharge Diagnoses:  Principal Problem:   Closed left hip fracture, initial encounter [facility]) Active Problems:   Controlled type 2 diabetes mellitus with diabetic nephropathy (HCC)   Essential hypertension   Stage 3b chronic kidney disease (CKD) (HCC)   Protein-calorie malnutrition, severe    Discharge Instructions  Discharge Instructions     Call MD for:  difficulty breathing, headache or visual disturbances   Complete by: As directed    Call MD for:  extreme fatigue   Complete by: As directed    Call MD for:  persistant dizziness or light-headedness   Complete by: As directed    Call MD for:  persistant nausea and vomiting   Complete by: As directed    Call MD for:  severe uncontrolled pain   Complete by: As directed    Call MD for:  temperature >100.4   Complete by: As directed    Diet - low sodium heart healthy   Complete by: As directed    Increase activity slowly   Complete by:  As directed    No wound care   Complete by: As directed       Allergies as of 04/30/2022       Reactions   Amlodipine Besylate    REACTION: hair loss   Atenolol    REACTION: fluid retention   Enalapril Maleate    REACTION: palpitations   Oxycodone Hcl    REACTION: dizzy   Propoxyphene N-acetaminophen Other (See Comments)   Unknown  reaction        Medication List     TAKE these medications    acetaminophen 500 MG tablet Commonly known as: TYLENOL Take 500 mg by mouth every 6 (six) hours as needed for moderate pain.   aspirin EC 81 MG tablet Take 81 mg by mouth daily.   carvedilol 25 MG tablet Commonly known as: COREG Take 1 tablet (25 mg total) by mouth 2 (two) times daily with a meal.   cyanocobalamin 1000 MCG tablet Take 1 tablet (1,000 mcg total) by mouth daily. Start taking on: May 02, 2022   dapagliflozin propanediol 10 MG Tabs tablet Commonly known as: Farxiga Take 1 tablet (10 mg total) by mouth daily before breakfast.   enoxaparin 40 MG/0.4ML injection Commonly known as: LOVENOX Inject 0.4 mLs (40 mg total) into the skin daily.   folic acid 1 MG tablet Commonly known as: FOLVITE Take 1 tablet (1 mg total) by mouth daily. Start taking on: May 01, 2022   glimepiride 2 MG tablet Commonly known as: AMARYL Take 1 tablet (2 mg total) by mouth daily before breakfast.   HYDROcodone-acetaminophen 5-325 MG tablet Commonly known as: NORCO/VICODIN Take 1 tablet by mouth every 6 (six) hours as needed for severe pain (POSTOPERATIVE PAIN).   mupirocin ointment 2 % Commonly known as: BACTROBAN On leg wound w/dressing change qd or bid What changed:  how much to take how to take this when to take this reasons to take this additional instructions   omeprazole 20 MG capsule Commonly known as: PRILOSEC Take 1 capsule (20 mg total) by mouth daily.   thiamine 100 MG tablet Commonly known as: Vitamin B-1 Take 1 tablet (100 mg total) by mouth daily. Start taking on: May 01, 2022   torsemide 20 MG tablet Commonly known as: DEMADEX Take 1-2 tablets (20-40 mg total) by mouth daily as needed. What changed: reasons to take this   Vitamin D3 50 MCG (2000 UT) capsule Take 1 capsule (2,000 Units total) by mouth daily.        Follow-up Information     Georgeanna Harrison, MD Follow  up in 2 week(s).   Specialty: Orthopedic Surgery Contact information: [address] [place] [address] [phone]                Allergies  Allergen Reactions   Amlodipine Besylate     REACTION: hair loss   Atenolol     REACTION: fluid retention   Enalapril Maleate     REACTION: palpitations   Oxycodone Hcl     REACTION: dizzy   Propoxyphene N-Acetaminophen Other (See Comments)    Unknown reaction    Consultations: Orthopedics, Dr. Mable Fill Neurology   Procedures/Studies: MR BRAIN WO CONTRAST  Result Date: 04/27/2022 CLINICAL DATA:  Altered mental status EXAM: MRI HEAD WITHOUT CONTRAST TECHNIQUE: Multiplanar, multiecho pulse sequences of the brain and surrounding structures were obtained without intravenous contrast. COMPARISON:  Same-day CT/CTA head and neck, brain MRI 10/17/2021 FINDINGS: Image quality is intermittently motion degraded. Specifically, the  axial FLAIR sequence is moderately motion degraded. Brain: There is no evidence of acute intracranial hemorrhage, extra-axial fluid collection, or acute territorial infarct. Tiny foci of SWI signal abnormality in the high right frontoparietal region are favored artifactual. There is unchanged parenchymal volume loss with prominence of the ventricular system and extra-axial CSF spaces. There is extensive patchy and confluence FLAIR signal abnormality throughout the supratentorial brain consistent with advanced chronic small-vessel ischemic change. Encephalomalacia in the left frontal lobe consistent with prior infarct is unchanged. Small remote infarcts in the bilateral cerebellar hemispheres are unchanged. Old hemorrhage in the bilateral parietal lobes is unchanged likely associated with prior infarcts There is no mass lesion.  There is no mass effect or midline shift. Vascular: Normal flow voids. Skull and upper cervical spine: Normal marrow signal. Sinuses/Orbits: The paranasal sinuses are clear. Bilateral lens  implants are in place. The globes and orbits are otherwise unremarkable. Other: None. IMPRESSION: 1. No definite evidence of acute intracranial pathology, within the confines of motion degraded images. 2. Stable chronic findings as above. Electronically Signed   By: Valetta Mole M.D.   On: 04/27/2022 21:37   EEG adult  Result Date: 04/27/2022 Lora Havens, MD     04/27/2022  2:48 PM Patient Name: Brandy Gonzales MRN: [id number] Epilepsy Attending: Lora Havens Referring Physician/Provider: Donnetta Simpers, MD Date: 04/27/2022 Duration: 22.03 mins Patient history: 81 year old female with presyncopal episode this morning now with aphasia.  EEG to evaluate for seizure. Level of alertness: Awake AEDs during EEG study: None Technical aspects: This EEG study was done with scalp electrodes positioned according to the 10-20 International system of electrode placement. Electrical activity was reviewed with band pass filter of 1-70Hz , sensitivity of 7 uV/mm, display speed of 70mm/sec with a 60Hz  notched filter applied as appropriate. EEG data were recorded continuously and digitally stored.  Video monitoring was available and reviewed as appropriate. Description: No clear posterior dominant rhythm was seen.  EEG showed continuous generalized predominantly 5 to 7 Hz theta slowing admixed with intermittent generalized 2 to 3 Hz delta slowing. Hyperventilation and photic stimulation were not performed.   ABNORMALITY - Continuous slow, generalized IMPRESSION: This study is suggestive of moderate diffuse encephalopathy, nonspecific etiology. No seizures or epileptiform discharges were seen throughout the recording. Priyanka Barbra Sarks   CT ANGIO HEAD NECK W WO CM (CODE STROKE)  Result Date: 04/27/2022 CLINICAL DATA:  Aphasia out of proportion to Encephalopathy. EXAM: CT ANGIOGRAPHY HEAD AND NECK TECHNIQUE: Multidetector CT imaging of the head and neck was performed using the standard protocol during bolus  administration of intravenous contrast. Multiplanar CT image reconstructions and MIPs were obtained to evaluate the vascular anatomy. Carotid stenosis measurements (when applicable) are obtained utilizing NASCET criteria, using the distal internal carotid diameter as the denominator. RADIATION DOSE REDUCTION: This exam was performed according to the departmental dose-optimization program which includes automated exposure control, adjustment of the mA and/or kV according to patient size and/or use of iterative reconstruction technique. CONTRAST:  19mL OMNIPAQUE IOHEXOL 350 MG/ML SOLN COMPARISON:  Head CT earlier same day.  MRI 11/16/2021 FINDINGS: CTA NECK FINDINGS Aortic arch: Aortic atherosclerosis. Branching pattern is normal without origin stenosis. Right carotid system: Common carotid artery widely patent to the bifurcation. Calcified plaque at the carotid bifurcation and ICA bulb but no stenosis. Cervical ICA widely patent. Left carotid system: Common carotid artery widely patent to the bifurcation. No plaque at the bifurcation or ICA bulb. No ICA stenosis. Vertebral arteries: Both vertebral arteries widely  patent at their origins and through the cervical region to the foramen magnum. Skeleton: Chronic cervical spondylosis. Other neck: No mass or lymphadenopathy. Upper chest: Normal Review of the MIP images confirms the above findings CTA HEAD FINDINGS Anterior circulation: Both internal carotid arteries are patent through the skull base and siphon regions. There is ordinary siphon atherosclerotic calcification but no stenosis greater than 30-50%. The anterior and middle cerebral vessels are patent. No large vessel occlusion or proximal stenosis. No aneurysm or vascular malformation. Posterior circulation: Both vertebral arteries widely patent to the basilar. No basilar stenosis. Posterior circulation branch vessels are normal. Venous sinuses: Patent and normal. Anatomic variants: None significant. Review of  the MIP images confirms the above findings IMPRESSION: 1. No large vessel occlusion or proximal stenosis. 2. Aortic atherosclerosis. 3. Atherosclerotic change at the right carotid bifurcation but no stenosis. 4. Atherosclerotic disease in both carotid siphon regions but without stenosis greater than 30-50% suspected. Aortic Atherosclerosis (ICD10-I70.0). Electronically Signed   By: Nelson Chimes M.D.   On: 04/27/2022 13:10   CT HEAD CODE STROKE WO CONTRAST  Result Date: 04/27/2022 CLINICAL DATA:  Code stroke. Neuro deficit, acute, stroke suspected. EXAM: CT HEAD WITHOUT CONTRAST TECHNIQUE: Contiguous axial images were obtained from the base of the skull through the vertex without intravenous contrast. RADIATION DOSE REDUCTION: This exam was performed according to the departmental dose-optimization program which includes automated exposure control, adjustment of the mA and/or kV according to patient size and/or use of iterative reconstruction technique. COMPARISON:  11/16/2021 FINDINGS: Brain: Generalized atrophy. No focal abnormality seen affecting the brainstem or cerebellum. Extensive chronic appearing small vessel ischemic changes of the cerebral hemispheric white matter. No sign of acute large vessel territory stroke. No mass lesion, hemorrhage, hydrocephalus or extra-axial collection. Vascular: There is atherosclerotic calcification of the major vessels at the base of the brain. Skull: Negative Sinuses/Orbits: Clear/normal Other: None ASPECTS ([place] Stroke Program Early CT Score) - Ganglionic level infarction (caudate, lentiform nuclei, internal capsule, insula, M1-M3 cortex): 7 - Supraganglionic infarction (M4-M6 cortex): 3 Total score (0-10 with 10 being normal): 10 IMPRESSION: 1. No acute CT finding. Atrophy and extensive chronic small-vessel ischemic changes of the white matter. 2. Aspects is 10. These results were communicated to Dr. Lorrin Goodell at 12:54 pm on 04/27/2022 by text page via the [facility]  messaging system. Electronically Signed   By: Nelson Chimes M.D.   On: 04/27/2022 12:55   DG FEMUR MIN 2 VIEWS LEFT  Result Date: 04/26/2022 CLINICAL DATA:  Left hip ORIF EXAM: LEFT FEMUR 2 VIEWS COMPARISON:  04/25/2022 FINDINGS: Interval postsurgical changes from ORIF of intertrochanteric left femur fracture with long intramedullary rod, proximal lag screw, and distal interlocking screws. Anatomic alignment at the fracture site. No new fractures. No malalignment. Expected postoperative changes within the overlying soft tissues. IMPRESSION: Interval ORIF of intertrochanteric left femur fracture. No postoperative complications are evident. Electronically Signed   By: Davina Poke D.O.   On: 04/26/2022 14:11   DG FEMUR MIN 2 VIEWS LEFT  Result Date: 04/26/2022 CLINICAL DATA:  Left hip surgery EXAM: LEFT FEMUR 2 VIEWS COMPARISON:  04/25/2022 FINDINGS: Six C-arm fluoroscopic images were obtained intraoperatively and submitted for post operative interpretation. Images demonstrate placement of ORIF hardware traversing proximal left femur fracture with improved alignment. 105 seconds fluoroscopy time utilized. Radiation dose: 6.36 mGy. Please see the performing provider's procedural report for further detail. IMPRESSION: Intraoperative fluoroscopy provided for left hip ORIF. Electronically Signed   By: Davina Poke D.O.   On:  04/26/2022 13:31   DG C-Arm 1-60 Min-No Report  Result Date: 04/26/2022 Fluoroscopy was utilized by the requesting physician.  No radiographic interpretation.   DG Femur Min 2 Views Left  Result Date: 04/25/2022 CLINICAL DATA:  Left hip fracture. EXAM: LEFT FEMUR 2 VIEWS COMPARISON:  Earlier same day hip radiograph at 1915 hours FINDINGS: Comminuted intertrochanteric fracture, better visualized on earlier same day radiograph secondary to overlying soft tissues. No other acute fracture of the left femur. Partially visualized intramedullary rod with proximal locking screws in  the tibia. Small ossification noted adjacent to the medial femoral condyle, likely sequela from prior trauma. No suprapatellar knee joint effusion. Vascular calcifications are seen in the left thigh and left lower leg. IMPRESSION: Acute comminuted intertrochanteric fracture, better visualized on earlier same day hip radiograph due to overlying soft tissues. No other acute fractures of the left femur. Electronically Signed   By: Ileana Roup M.D.   On: 04/25/2022 20:38   DG Chest 1 View  Result Date: 04/25/2022 CLINICAL DATA:  Fall EXAM: CHEST  1 VIEW COMPARISON:  Chest x-ray 09/27/2021 FINDINGS: The heart size and mediastinal contours are within normal limits. Both lungs are clear. The visualized skeletal structures are unremarkable. IMPRESSION: No active disease. Electronically Signed   By: Ronney Asters M.D.   On: 04/25/2022 19:49   DG Hip Unilat W or Wo Pelvis 2-3 Views Left  Result Date: 04/25/2022 CLINICAL DATA:  Hip pain, fall. EXAM: DG HIP (WITH OR WITHOUT PELVIS) 2-3V LEFT COMPARISON:  None. FINDINGS: There is a comminuted left femoral intratrochanteric fracture with apex lateral angulation and overlying soft tissue swelling. There is no dislocation identified. IMPRESSION: Comminuted left femoral intratrochanteric fracture. Electronically Signed   By: Ronney Asters M.D.   On: 04/25/2022 19:44     Subjective: Patient seen examined bedside, resting calmly.  Discharging to SNF today.  No complaints this morning.  No acute events overnight per nurse staff.  Discharge Exam: Vitals:   04/30/22 0817 04/30/22 1255  BP: (!) 158/66 (!) 174/77  Pulse: 90 84  Resp: 17   Temp: 98.7 F (37.1 C) 98.8 F (37.1 C)  SpO2: 100% 100%   Vitals:   04/30/22 0546 04/30/22 0817 04/30/22 1021 04/30/22 1255  BP: (!) 153/77 (!) 158/66  (!) 174/77  Pulse: 82 90  84  Resp: 17 17    Temp: 99.3 F (37.4 C) 98.7 F (37.1 C)  98.8 F (37.1 C)  TempSrc: Oral Oral  Oral  SpO2: 100% 100%  100%  Weight:       Height:   4\' 11"  (1.499 m)     Physical Exam: GEN: NAD, alert and oriented x 3, wd/wn HEENT: NCAT, PERRL, EOMI, sclera clear, MMM PULM: CTAB w/o wheezes/crackles, normal respiratory effort, on room air CV: RRR w/o M/G/R GI: abd soft, NTND, NABS, no R/G/M MSK: no peripheral edema, muscle strength globally intact 5/5 bilateral upper/lower extremities, neurovascularly intact, surgical dressing noted left hip clean/dry/intact NEURO: CN II-XII intact, no focal deficits, sensation to light touch intact PSYCH: normal mood/affect Integumentary: dry/intact, no rashes or wounds    The results of significant diagnostics from this hospitalization (including imaging, microbiology, ancillary and laboratory) are listed below for reference.     Microbiology: Recent Results (from the past 240 hour(s))  Surgical PCR screen     Status: None   Collection Time: 04/25/22 11:56 PM   Specimen: Nasal Mucosa; Nasal Swab  Result Value Ref Range Status   MRSA, PCR NEGATIVE NEGATIVE  Final   Staphylococcus aureus NEGATIVE NEGATIVE Final    Comment: (NOTE) The Xpert SA Assay (FDA approved for NASAL specimens in patients 54 years of age and older), is one component of a comprehensive surveillance program. It is not intended to diagnose infection nor to guide or monitor treatment. Performed at [facility], [place] [address], [place], [place] [address]      Labs: BNP (last 3 results) No results for input(s): "BNP" in the last 8760 hours. Basic Metabolic Panel: Recent Labs  Lab 04/25/22 1857 04/26/22 0234 04/27/22 0311 04/28/22 0350 04/29/22 0345  NA 143 143 138 139 140  K 3.6 3.7 3.6 3.6 3.5  CL 109 110 107 104 112*  CO2 27 23 23  21* 21*  GLUCOSE 244* 165* 142* 162* 153*  BUN 14 12 16 20 21   CREATININE 1.46* 1.25* 1.48* 1.68* 1.43*  CALCIUM 9.7 9.7 9.1 9.3 8.6*  MG  --   --   --  1.8  --   PHOS  --   --   --  3.3  --    Liver Function Tests: Recent Labs  Lab 04/25/22 1857  AST 30   ALT 31  ALKPHOS 117  BILITOT 1.1  PROT 5.8*  ALBUMIN 3.5   No results for input(s): "LIPASE", "AMYLASE" in the last 168 hours. No results for input(s): "AMMONIA" in the last 168 hours. CBC: Recent Labs  Lab 04/25/22 1857 04/26/22 0136 04/27/22 0311 04/28/22 0350 04/29/22 0345  WBC 6.6 7.4 9.8 9.0 7.0  NEUTROABS 5.2  --   --   --   --   HGB 12.1 11.8* 10.2* 10.0* 9.7*  HCT 36.8 34.7* 29.4* 29.7* 28.4*  MCV 91.1 89.0 89.1 89.2 90.4  PLT 173 158 126* 146* 135*   Cardiac Enzymes: No results for input(s): "CKTOTAL", "CKMB", "CKMBINDEX", "TROPONINI" in the last 168 hours. BNP: Invalid input(s): "POCBNP" CBG: Recent Labs  Lab 04/29/22 1634 04/29/22 2102 04/29/22 2158 04/30/22 0637 04/30/22 1251  GLUCAP 229* 207* 201* 160* 204*   D-Dimer No results for input(s): "DDIMER" in the last 72 hours. Hgb A1c No results for input(s): "HGBA1C" in the last 72 hours. Lipid Profile No results for input(s): "CHOL", "HDL", "LDLCALC", "TRIG", "CHOLHDL", "LDLDIRECT" in the last 72 hours. Thyroid function studies Recent Labs    04/27/22 1328  TSH 1.460   Anemia work up Recent Labs    04/27/22 1328  VITAMINB12 208  FOLATE 5.9*   Urinalysis    Component Value Date/Time   COLORURINE YELLOW 11/16/2021 1330   APPEARANCEUR HAZY (A) 11/16/2021 1330   LABSPEC 1.012 11/16/2021 1330   PHURINE 5.0 11/16/2021 1330   GLUCOSEU >=500 (A) 11/16/2021 1330   GLUCOSEU NEGATIVE 10/21/2021 1404   HGBUR NEGATIVE 11/16/2021 1330   BILIRUBINUR NEGATIVE 11/16/2021 1330   KETONESUR NEGATIVE 11/16/2021 1330   PROTEINUR NEGATIVE 11/16/2021 1330   UROBILINOGEN 0.2 10/21/2021 1404   NITRITE NEGATIVE 11/16/2021 1330   LEUKOCYTESUR MODERATE (A) 11/16/2021 1330   Sepsis Labs Recent Labs  Lab 04/26/22 0136 04/27/22 0311 04/28/22 0350 04/29/22 0345  WBC 7.4 9.8 9.0 7.0   Microbiology Recent Results (from the past 240 hour(s))  Surgical PCR screen     Status: None   Collection Time: 04/25/22  11:56 PM   Specimen: Nasal Mucosa; Nasal Swab  Result Value Ref Range Status   MRSA, PCR NEGATIVE NEGATIVE Final   Staphylococcus aureus NEGATIVE NEGATIVE Final    Comment: (NOTE) The Xpert SA Assay (FDA approved for  NASAL specimens in patients 27 years of age and older), is one component of a comprehensive surveillance program. It is not intended to diagnose infection nor to guide or monitor treatment. Performed at [facility], [place] [address] [place], [place] [address]      Time coordinating discharge: Over 30 minutes  SIGNED:   Donnamarie Poag [place] DO  [facility] 04/30/2022, 1:26 PM

## 2022-04-30 NOTE — Progress Notes (Signed)
PROGRESS NOTE    Brandy Gonzales  ZOX:[id number] DOB: 11-03-1940 DOA: 04/25/2022 PCP: Cassandria Anger, MD    Brief Narrative:   Brandy Gonzales is a 81 y.o. female with past medical history significant for HTN, DM2, CKD stage IIIb who presented to [facility] ED on 12/16 with complaints of left hip pain following fall at home.  Patient reports she was unable to bear weight on her left leg due to severe hip pain.  Workup in the ED notable for comminuted left femoral intertrochanteric fracture.  Orthopedics was consulted and patient underwent ORIF by Dr. Mable Fill on 12/17.  On postoperative day #2, while working with physical therapy patient had a brief syncopal episode; code stroke was initially activated in which patient underwent EEG and MRI testing which ruled out acute stroke and neurology has now signed off.  Patient medically stable for discharge pending SNF bed.  Assessment & Plan:   Left femoral intertrochanteric fracture Patient presenting to ED following mechanical fall at home with inability to bear weight secondary to severe hip pain.  Workup in the ED with imaging notable for comminuted left femoral intra-articular fracture.  Orthopedics was consulted and patient underwent ORIF on 12/17. -- WBAT LLE -- Postoperative DVT prophylaxis with Lovenox 40 mg daily x 6 weeks -- Norco as needed moderate/severe pain -- Continue PT/OT efforts while inpatient. -- Outpatient follow-up with orthopedics 2 weeks -- Pending SNF placement  Syncope On postoperative day #2, patient had syncopal episode while working with physical therapy.  Code stroke was initially activated with CT head, MRI and EEG all unremarkable.  No arrhythmias were reported on telemetry.  No further events since.  B12/folate deficiency Folate 5.9, vitamin W11 914. -- Folic acid 1 mg p.o. daily -- Vitamin B12 1000 mcg daily  Essential hypertension -- Carvedilol 25 mg p.o. twice daily  Type 2 diabetes mellitus Hemoglobin A1c 7.17 April 2022, not optimally controlled.  Home regimen includes Amaryl 2 mg p.o. daily, Farxiga 10 mg p.o. daily. --sensitive SSI for coverage --CBGs qAC/HS  CKD stage IIIb Stable, baseline creatinine 1.2-1.4.   DVT prophylaxis: enoxaparin (LOVENOX) injection 40 mg Start: 04/27/22 1000 SCDs Start: 04/26/22 1451 SCDs Start: 04/25/22 2015    Code Status: Full Code Family Communication: Updated family present at bedside this morning  Disposition Plan:  Level of care: Med-Surg Status is: Inpatient Remains inpatient appropriate because: Medically stable for discharge once SNF bed available.    Consultants:  Orthopedics, Dr. Mable Fill Neurology  Procedures:  EEG  Antimicrobials:  Perioperative vancomycin/cefazolin   Subjective: Patient seen examined bedside, resting comfortably.  Sitting in bedside chair.  Family present.  Continue to await SNF placement.  Medically stable for discharge once bed available.  Denies headache, no dizziness, no chest pain, no palpitations, no shortness of breath, no abdominal pain, no focal weakness, no fatigue, no paresthesias.  No acute events overnight per nurse staff.  Objective: Vitals:   04/30/22 0007 04/30/22 0546 04/30/22 0817 04/30/22 1021  BP: (!) 141/52 (!) 153/77 (!) 158/66   Pulse: 77 82 90   Resp: 18 17 17    Temp: 98.7 F (37.1 C) 99.3 F (37.4 C) 98.7 F (37.1 C)   TempSrc: Oral Oral Oral   SpO2: 99% 100% 100%   Weight:      Height:    4\' 11"  (1.499 m)    Intake/Output Summary (Last 24 hours) at 04/30/2022 1038 Last data filed at 04/30/2022 0700 Gross per 24 hour  Intake 117 ml  Output 1000 ml  Net -883 ml   Filed Weights   04/25/22 2340 04/28/22 0621 04/29/22 0600  Weight: 51.9 kg 59.4 kg 61.3 kg    Examination:  Physical Exam: GEN: NAD, alert and oriented x 3, wd/wn HEENT: NCAT, PERRL, EOMI, sclera clear, MMM PULM: CTAB w/o wheezes/crackles, normal respiratory effort, on room air CV: RRR w/o M/G/R GI: abd  soft, NTND, NABS, no R/G/M MSK: no peripheral edema, muscle strength globally intact 5/5 bilateral upper/lower extremities neurovascularly intact, surgical dressing noted left hip clean/dry/intact NEURO: CN II-XI intact, no focal deficits, sensation to light touch intact PSYCH: normal mood/affect Integumentary: dry/intact, no rashes or wounds    Data Reviewed: I have personally reviewed following labs and imaging studies  CBC: Recent Labs  Lab 04/25/22 1857 04/26/22 0136 04/27/22 0311 04/28/22 0350 04/29/22 0345  WBC 6.6 7.4 9.8 9.0 7.0  NEUTROABS 5.2  --   --   --   --   HGB 12.1 11.8* 10.2* 10.0* 9.7*  HCT 36.8 34.7* 29.4* 29.7* 28.4*  MCV 91.1 89.0 89.1 89.2 90.4  PLT 173 158 126* 146* 782*   Basic Metabolic Panel: Recent Labs  Lab 04/25/22 1857 04/26/22 0234 04/27/22 0311 04/28/22 0350 04/29/22 0345  NA 143 143 138 139 140  K 3.6 3.7 3.6 3.6 3.5  CL 109 110 107 104 112*  CO2 27 23 23  21* 21*  GLUCOSE 244* 165* 142* 162* 153*  BUN 14 12 16 20 21   CREATININE 1.46* 1.25* 1.48* 1.68* 1.43*  CALCIUM 9.7 9.7 9.1 9.3 8.6*  MG  --   --   --  1.8  --   PHOS  --   --   --  3.3  --    GFR: Estimated Creatinine Clearance: 24.1 mL/min (A) (by C-G formula based on SCr of 1.43 mg/dL (H)). Liver Function Tests: Recent Labs  Lab 04/25/22 1857  AST 30  ALT 31  ALKPHOS 117  BILITOT 1.1  PROT 5.8*  ALBUMIN 3.5   No results for input(s): "LIPASE", "AMYLASE" in the last 168 hours. No results for input(s): "AMMONIA" in the last 168 hours. Coagulation Profile: No results for input(s): "INR", "PROTIME" in the last 168 hours. Cardiac Enzymes: No results for input(s): "CKTOTAL", "CKMB", "CKMBINDEX", "TROPONINI" in the last 168 hours. BNP (last 3 results) No results for input(s): "PROBNP" in the last 8760 hours. HbA1C: No results for input(s): "HGBA1C" in the last 72 hours. CBG: Recent Labs  Lab 04/29/22 1125 04/29/22 1634 04/29/22 2102 04/29/22 2158 04/30/22 0637   GLUCAP 214* 229* 207* 201* 160*   Lipid Profile: No results for input(s): "CHOL", "HDL", "LDLCALC", "TRIG", "CHOLHDL", "LDLDIRECT" in the last 72 hours. Thyroid Function Tests: Recent Labs    04/27/22 1328  TSH 1.460   Anemia Panel: Recent Labs    04/27/22 1328  VITAMINB12 208  FOLATE 5.9*   Sepsis Labs: No results for input(s): "PROCALCITON", "LATICACIDVEN" in the last 168 hours.  Recent Results (from the past 240 hour(s))  Surgical PCR screen     Status: None   Collection Time: 04/25/22 11:56 PM   Specimen: Nasal Mucosa; Nasal Swab  Result Value Ref Range Status   MRSA, PCR NEGATIVE NEGATIVE Final   Staphylococcus aureus NEGATIVE NEGATIVE Final    Comment: (NOTE) The Xpert SA Assay (FDA approved for NASAL specimens in patients 64 years of age and older), is one component of a comprehensive surveillance program. It is not intended to diagnose infection nor to guide or monitor  treatment. Performed at [facility], [place] [address], [place], [place] [address]          Radiology Studies: No results found.      Scheduled Meds:  carvedilol  25 mg Oral BID WC   cyanocobalamin  1,000 mcg Intramuscular Daily   Followed by   Derrill Memo ON 05/02/2022] vitamin B-12  1,000 mcg Oral Daily   docusate sodium  100 mg Oral BID   enoxaparin (LOVENOX) injection  40 mg Subcutaneous S91Z   folic acid  1 mg Oral Daily   Or   folic acid  1 mg Intravenous Daily   insulin aspart  0-5 Units Subcutaneous QHS   insulin aspart  0-9 Units Subcutaneous TID WC   thiamine (VITAMIN B1) injection  100 mg Intravenous Daily   Or   thiamine  100 mg Oral Daily   Continuous Infusions:  sodium chloride 100 mL/hr at 04/28/22 1947     LOS: 5 days    Time spent: 46 minutes spent on chart review, discussion with nursing staff, consultants, updating family and interview/physical exam; more than 50% of that time was spent in counseling and/or coordination of care.    Candice Tobey J [place]  DO [facility] Available via Epic secure chat 7am-7pm After these hours, please refer to coverage provider listed on [email or web address] 04/30/2022, 10:38 AM

## 2022-04-30 NOTE — TOC Progression Note (Incomplete)
Transition of Care [facility]) - Progression Note    Patient Details  Name: Brandy Gonzales MRN: [id number] Date of Birth: February 14, 1941  [facility] CM/SW Contact  Jinger Neighbors, [place] Phone Number: 04/30/2022, 12:55 PM  Clinical Narrative:       Planned Disposition: [place] Barriers to Discharge: Continued Medical Work up  Expected Discharge Plan and Services In-house Referral: Clinical Social Work Discharge Planning Services: CM Consult Post Acute Care Choice: [place] Elder Living arrangements for the past 2 months: [place] Determinants of Health (SDOH) Interventions SDOH Screenings   Food Insecurity: No Food Insecurity (04/25/2022)  Housing: Low Risk  (04/25/2022)  Transportation Needs: No Transportation Needs (04/25/2022)  Utilities: Not At Risk (04/25/2022)  Alcohol Screen: Low Risk  (03/24/2021)  Depression (PHQ2-9): Medium Risk (01/21/2022)  Financial Resource Strain: Low Risk  (03/24/2021)  Physical Activity: Inactive (03/24/2021)  Social Connections: Moderately Isolated (03/24/2021)  Stress: No Stress Concern Present (03/24/2021)  Tobacco Use: Low Risk  (04/29/2022)    Readmission Risk Interventions     No data to display

## 2022-04-30 NOTE — Progress Notes (Signed)
Physical Therapy Treatment Patient Details Name: Brandy Gonzales MRN: [id number] DOB: 01/28/1941 Today's Date: 04/30/2022   History of Present Illness 81 yo admitted 12/16 after fall with left hip fx s/p IM nail 12/17. PMhx; dementia, GERD, glaucoma, syncope, HTN, DM    PT Comments    Pt greeted propped in bed and eager for session with continued progress towards acute goals, however pt continues to be limited by pain and impaired cognition demonstrating difficulty following commands needing multimodal cues throughout. Pt needing grossly less assist this session, able to complete bed mobility with min assist and sit>stand transfers and step pivot to recliner with mod assist and step by step verbal and tactile cues to advance bil feet and to manage RW. Educated pr re; importance of continued mobility, HEP, and benefits of OOB upright sitting with pt verbalizing understanding. Current plan remains appropriate to address deficits and maximize functional independence and decrease caregiver burden. Pt continues to benefit from skilled PT services to progress toward functional mobility goals.     Recommendations for follow up therapy are one component of a multi-disciplinary discharge planning process, led by the attending physician.  Recommendations may be updated based on patient status, additional functional criteria and insurance authorization.  Follow Up Recommendations  Skilled nursing-short term rehab (<3 hours/day) Can patient physically be transported by private vehicle: No   Assistance Recommended at Discharge Frequent or constant Supervision/Assistance  Patient can return home with the following Two people to help with walking and/or transfers;Assistance with cooking/housework;Direct supervision/assist for medications management;Assist for transportation;Help with stairs or ramp for entrance;A lot of help with bathing/dressing/bathroom   Equipment Recommendations  BSC/3in1     Recommendations for Other Services       Precautions / Restrictions Precautions Precautions: Fall Precaution Comments: Watch BP Restrictions Weight Bearing Restrictions: Yes LLE Weight Bearing: Weight bearing as tolerated     Mobility  Bed Mobility Overal bed mobility: Needs Assistance Bed Mobility: Supine to Sit     Supine to sit: HOB elevated, Min assist     General bed mobility comments: min assist to bring LEs to EOB, signifigantly increased time needed to complete    Transfers Overall transfer level: Needs assistance Equipment used: Rolling walker (2 wheels) Transfers: Sit to/from Stand, Bed to chair/wheelchair/BSC Sit to Stand: Mod assist   Step pivot transfers: Mod assist       General transfer comment: dificulty advancing right LE when stepping with RW. Max VC for [employer]. STS x3 during session    Ambulation/Gait               General Gait Details: nt secondary to pain   Stairs             Wheelchair Mobility    Modified Rankin (Stroke Patients Only)       Balance Overall balance assessment: History of Falls, Needs assistance Sitting-balance support: Feet supported, No upper extremity supported Sitting balance-Leahy Scale: Poor     Standing balance support: Reliant on assistive device for balance, Bilateral upper extremity supported, During functional activity Standing balance-Leahy Scale: Poor                              Cognition Arousal/Alertness: Awake/alert Behavior During Therapy: Flat affect Overall Cognitive Status: History of cognitive impairments - at baseline  General Comments: Max difficulty following one step commands requiring physical assist, verbal cues, and visual cues.        Exercises Total Joint Exercises Ankle Circles/Pumps: AROM, Right, Left, 10 reps Quad Sets: AROM, Left, 5 reps Heel Slides: AROM, AAROM, Left, 10 reps Long Arc Quad:  AROM, Left, 5 reps    General Comments        Pertinent Vitals/Pain Pain Assessment Pain Assessment: 0-10 Pain Score: 10-Worst pain ever Pain Location: left hip Pain Descriptors / Indicators: Aching, Grimacing, Guarding Pain Intervention(s): Monitored during session, Limited activity within patient's tolerance, Repositioned    Home Living                          Prior Function            PT Goals (current goals can now be found in the care plan section) Acute Rehab PT Goals Patient Stated Goal: to get better PT Goal Formulation: With patient/family Time For Goal Achievement: 05/11/22 Progress towards PT goals: Progressing toward goals    Frequency    Min 4X/week      PT Plan      Co-evaluation              AM-PAC PT "6 Clicks" Mobility   Outcome Measure  Help needed turning from your back to your side while in a flat bed without using bedrails?: A Lot Help needed moving from lying on your back to sitting on the side of a flat bed without using bedrails?: A Lot Help needed moving to and from a bed to a chair (including a wheelchair)?: A Lot Help needed standing up from a chair using your arms (e.g., wheelchair or bedside chair)?: A Lot Help needed to walk in hospital room?: Total Help needed climbing 3-5 steps with a railing? : Total 6 Click Score: 10    End of Session Equipment Utilized During Treatment: Gait belt Activity Tolerance: Patient limited by pain;Patient tolerated treatment well Patient left: with call bell/phone within reach;with family/visitor present;in chair;with chair alarm set Nurse Communication: Mobility status PT Visit Diagnosis: Other abnormalities of gait and mobility (R26.89);Muscle weakness (generalized) (M62.81);History of falling (Z91.81)     Time: 7846-9629 PT Time Calculation (min) (ACUTE ONLY): 22 min  Charges:  $Therapeutic Exercise: 8-22 mins                     Kathrin Folden R. PTA Acute Rehabilitation  Services Office: [place] 04/30/2022, 9:26 AM

## 2022-05-01 DIAGNOSIS — I10 Essential (primary) hypertension: Secondary | ICD-10-CM | POA: Diagnosis not present

## 2022-05-01 DIAGNOSIS — E43 Unspecified severe protein-calorie malnutrition: Secondary | ICD-10-CM | POA: Diagnosis not present

## 2022-05-01 DIAGNOSIS — E1121 Type 2 diabetes mellitus with diabetic nephropathy: Secondary | ICD-10-CM | POA: Diagnosis not present

## 2022-05-01 DIAGNOSIS — S72002A Fracture of unspecified part of neck of left femur, initial encounter for closed fracture: Secondary | ICD-10-CM | POA: Diagnosis not present

## 2022-05-01 LAB — RESPIRATORY PANEL BY PCR

## 2022-05-01 LAB — GLUCOSE, CAPILLARY
Glucose-Capillary: 196 mg/dL — ABNORMAL HIGH (ref 70–99)
Glucose-Capillary: 199 mg/dL — ABNORMAL HIGH (ref 70–99)
Glucose-Capillary: 176 mg/dL — ABNORMAL HIGH (ref 70–99)
Glucose-Capillary: 147 mg/dL — ABNORMAL HIGH (ref 70–99)

## 2022-05-01 LAB — RESP PANEL BY RT-PCR (RSV, FLU A&B, COVID)  RVPGX2
Influenza A by PCR: POSITIVE — AB
Influenza B by PCR: NEGATIVE
Resp Syncytial Virus by PCR: NEGATIVE
SARS Coronavirus 2 by RT PCR: NEGATIVE

## 2022-05-01 MED ORDER — OSELTAMIVIR PHOSPHATE 30 MG PO CAPS
30.0000 mg | ORAL_CAPSULE | Freq: Every day | ORAL | Status: AC
  Administered 2022-05-01 – 2022-05-05 (×5): 30 mg via ORAL
  Filled 2022-05-01 (×6): qty 1

## 2022-05-01 NOTE — Progress Notes (Signed)
Occupational Therapy Treatment Patient Details Name: Brandy Gonzales MRN: [id number] DOB: 1941/02/02 Today's Date: 05/01/2022   History of present illness 81 yo admitted 12/16 after fall with left hip fx s/p IM nail 12/17. PMhx; dementia, GERD, glaucoma, syncope, HTN, DM   OT comments  Pt making gradual progress towards OT goals this session. Pt continues to present with decreased activity tolerance, impaired balance, cognitive deficits and increased pain . Pt agreeable to OT intervention with a focus on functional mobility progression. Pt currently requires MOD A for bed mobility and MOD A for sit>stands from EOB, attempted step pivot transfer to recliner to R side however pt with difficulty advancing LLE during pivot. Pt with poor motor planning/proprioception needing MAX cues for hand placement and body awareness. Pt would continue to benefit from skilled occupational therapy while admitted and after d/c to address the below listed limitations in order to improve overall functional mobility and facilitate independence with BADL participation. DC plan remains appropriate, will follow acutely per POC.      Recommendations for follow up therapy are one component of a multi-disciplinary discharge planning process, led by the attending physician.  Recommendations may be updated based on patient status, additional functional criteria and insurance authorization.    Follow Up Recommendations  Skilled nursing-short term rehab (<3 hours/day)     Assistance Recommended at Discharge Frequent or constant Supervision/Assistance  Patient can return home with the following  Two people to help with walking and/or transfers;A lot of help with bathing/dressing/bathroom;Assistance with cooking/housework;Help with stairs or ramp for entrance;Assist for transportation   Equipment Recommendations  Other (comment) (defer to next venue of care)    Recommendations for Other Services      Precautions /  Restrictions Precautions Precautions: Fall Precaution Comments: Watch BP Restrictions Weight Bearing Restrictions: Yes LLE Weight Bearing: Weight bearing as tolerated       Mobility Bed Mobility Overal bed mobility: Needs Assistance Bed Mobility: Supine to Sit, Sit to Supine       Sit to supine: Mod assist, HOB elevated Sit to sidelying: Mod assist, HOB elevated General bed mobility comments: pt requires assist to maneuver LLE during all bed mobility tasks, assist also needed to elevate trunk into sitting and max cues needed to sequence returning head to pillow when transitioning back to supine    Transfers Overall transfer level: Needs assistance Equipment used: Rolling walker (2 wheels) Transfers: Sit to/from Stand Sit to Stand: Mod assist           General transfer comment: MOD A to rise x2 from EOB with max cues for hand placment with cues needed to straighten BLEs and shift hips anteriorly to come into full stand, attempted step pivot to recliner to R however pt unable to advance LLE to pivot d/t impaired motor planning and pain     Balance Overall balance assessment: History of Falls, Needs assistance Sitting-balance support: Feet supported, No upper extremity supported Sitting balance-Leahy Scale: Poor     Standing balance support: Reliant on assistive device for balance, Bilateral upper extremity supported, During functional activity Standing balance-Leahy Scale: Poor                             ADL either performed or assessed with clinical judgement   ADL Overall ADL's : Needs assistance/impaired  Toilet Transfer: Moderate assistance Toilet Transfer Details (indicate cue type and reason): sit>stand only with MODA from EOB         Functional mobility during ADLs: Moderate assistance;Cueing for safety;Cueing for sequencing;Rolling walker (2 wheels) (sit>stand only) General ADL Comments: ADL participation  impacted by decreased activity tolerance, cognitive deficits and generalized deconditioning    Extremity/Trunk Assessment Upper Extremity Assessment Upper Extremity Assessment: Generalized weakness   Lower Extremity Assessment Lower Extremity Assessment: Defer to PT evaluation        Vision Baseline Vision/History: 1 Wears glasses Patient Visual Report: No change from baseline     Perception Perception Perception: Not tested   Praxis Praxis Praxis: Not tested    Cognition Arousal/Alertness: Awake/alert Behavior During Therapy: Flat affect Overall Cognitive Status: History of cognitive impairments - at baseline                                 General Comments: MAX cues with motor planning and hand placement        Exercises General Exercises - Lower Extremity Ankle Circles/Pumps: AROM, Both, 10 reps, Supine    Shoulder Instructions       General Comments noted L foot to be swollen    Pertinent Vitals/ Pain       Pain Assessment Pain Assessment: Faces Faces Pain Scale: Hurts little more Pain Location: left hip Pain Descriptors / Indicators: Aching, Grimacing, Guarding Pain Intervention(s): Monitored during session, Repositioned  Home Living                                          Prior Functioning/Environment              Frequency  Min 2X/week        Progress Toward Goals  OT Goals(current goals can now be found in the care plan section)  Progress towards OT goals: Progressing toward goals  Acute Rehab OT Goals Patient Stated Goal: none stated Time For Goal Achievement: 05/11/22 Potential to Achieve Goals: [place] Discharge plan remains appropriate;Frequency remains appropriate    Co-evaluation                 AM-PAC OT "6 Clicks" Daily Activity     Outcome Measure   Help from another person eating meals?: A Little Help from another person taking care of personal grooming?: A Little Help  from another person toileting, which includes using toliet, bedpan, or urinal?: A Lot Help from another person bathing (including washing, rinsing, drying)?: A Lot Help from another person to put on and taking off regular upper body clothing?: A Little Help from another person to put on and taking off regular lower body clothing?: A Lot 6 Click Score: 15    End of Session Equipment Utilized During Treatment: Gait belt;Rolling walker (2 wheels)  OT Visit Diagnosis: History of falling (Z91.81);Muscle weakness (generalized) (M62.81)   Activity Tolerance Patient tolerated treatment well   Patient Left in bed;with call bell/phone within reach;with bed alarm set   Nurse Communication Mobility status        Time: 5631-4970 OT Time Calculation (min): 17 min  Charges: OT General Charges $OT Visit: 1 Visit OT Treatments $Therapeutic Activity: 8-22 mins  Harley Alto., COTA/L Acute Rehabilitation Services [phone]  Dotty Gonzalo 05/01/2022, 1:54 PM

## 2022-05-01 NOTE — TOC CAGE-AID Note (Signed)
Transition of Care [facility]) - CAGE-AID Screening   Patient Details  Name: Brandy Gonzales MRN: [id number] Date of Birth: 11/03/1940  [facility] CM/SW Contact:    Army Melia, RN Phone Number:[phone] 05/01/2022, 12:32 AM   Clinical Narrative:  No hx of drug/alcohol use, no resources indicated.  CAGE-AID Screening:    Have You Ever Felt You Ought to Cut Down on Your Drinking or Drug Use?: No Have People Annoyed You By Critizing Your Drinking Or Drug Use?: No Have You Felt Bad Or Guilty About Your Drinking Or Drug Use?: No Have You Ever Had a Drink or Used Drugs First Thing In The Morning to Steady Your Nerves or to Get Rid of a Hangover?: No CAGE-AID Score: 0  Substance Abuse Education Offered: No

## 2022-05-01 NOTE — Progress Notes (Signed)
PHARMACY NOTE:  ANTIMICROBIAL RENAL DOSAGE ADJUSTMENT  Current antimicrobial regimen includes a mismatch between antimicrobial dosage and estimated renal function.  As per policy approved by the Pharmacy & Therapeutics and Medical Executive Committees, the antimicrobial dosage will be adjusted accordingly.  Current antimicrobial dosage:  Tamiflu 75mg  BID x 5 days  Indication: Influenza  Renal Function:  Estimated Creatinine Clearance: 24.1 mL/min (A) (by C-G formula based on SCr of 1.43 mg/dL (H)). []      On intermittent HD, scheduled: []      On CRRT    Antimicrobial dosage has been changed to:  Tamiflu 30 mg daily x 5 days  Additional comments:   Deon Ivey A. Levada Dy, PharmD, BCPS, FNKF Clinical Pharmacist [place] Please utilize Amion for appropriate phone number to reach the unit pharmacist ([place])  05/01/2022 1:27 PM

## 2022-05-01 NOTE — Progress Notes (Signed)
Mobility Specialist: Progress Note   05/01/22 1048  Mobility  Activity Stood at bedside  Level of Assistance Moderate assist, patient does 50-74%  Assistive Device Front wheel walker  LLE Weight Bearing WBAT  Activity Response Tolerated well  Mobility Referral Yes  $Mobility charge 1 Mobility   Pt received in the bed and agreeable to mobility. MinA with bed mobility to assist LLE to EOB and modA to stand. STSx3 with pt progressing to stand for 1 minute on last bout. C/o 8/10 LLE pain during session. No c/o dizziness or SOB. Pt assisted back to bed after session with call bell at her side. Bed alarm is on.   [place] Brandy Gonzales Mobility Specialist Please contact via SecureChat or Rehab office at [phone]

## 2022-05-01 NOTE — Plan of Care (Signed)
Discharge cancelled, low-grade temp 100 F at 3 AM, obtained COVID-19, respiratory virus panel, influenza panel.  COVID-19 negative.  Influenza panel positive for influenza A   Started on Tamiflu 75 mg twice daily for 5 days Continue droplet precautions.    Estill Cotta M.D.  [facility] 05/01/2022, 1:26 PM

## 2022-05-01 NOTE — TOC Transition Note (Addendum)
Transition of Care [facility]) - CM/SW Discharge Note   Patient Details  Name: Brandy Gonzales MRN: [id number] Date of Birth: 1940-06-04  Transition of Care [facility]) CM/SW Contact:  Jinger Neighbors, LCSW Phone Number: 05/01/2022, 8:22 AM   Clinical Narrative:     PT going to [facility] via [place].  Call to report: [phone] Room: 503 When call to report was made, facility had concerns about pt's temp of 100 from 4am check. Ivy, RN checked temp 98.7. Faciity asked for a Flu, RSV, and COVID swab to be completed. Nurse stated she would ask for orders. CSW canceled transport and messaged Butch Penny to make her aware of the delayed d/c.  Pt tested positive for the flu and can not go to the facility. Magda Paganini reports they will f/u on Tuesday  Final next level of care: [place] Barriers to Discharge: No Barriers Identified   Patient Goals and CMS Choice CMS Medicare.gov Compare Post Acute Care list provided to:: Patient Represenative (must comment) Denyse Dago) Choice offered to / list presented to : Adult Children  Discharge Placement                Patient [facility] bed at: [facility] Patient to be transferred to facility by: [place] Name of family member notified: Denyse Dago Patient and family notified of of transfer: 05/01/22  Discharge Plan and Services Additional resources added to the After Visit Summary for   In-house Referral: Clinical Social Work Discharge Planning Services: CM Consult Post Acute Care Choice: [place]                               Social Determinants of Health (SDOH) Interventions SDOH Screenings   Food Insecurity: No Food Insecurity (04/25/2022)  Housing: Low Risk  (04/25/2022)  Transportation Needs: No Transportation Needs (04/25/2022)  Utilities: Not At Risk (04/25/2022)  Alcohol Screen: Low Risk  (03/24/2021)  Depression (PHQ2-9): Medium Risk (01/21/2022)  Financial Resource Strain: Low Risk  (03/24/2021)   Physical Activity: Inactive (03/24/2021)  Social Connections: Moderately Isolated (03/24/2021)  Stress: No Stress Concern Present (03/24/2021)  Tobacco Use: Low Risk  (04/29/2022)     Readmission Risk Interventions     No data to display

## 2022-05-01 NOTE — Discharge Summary (Signed)
Physician Discharge Summary   Patient: Brandy Gonzales MRN: [id number] DOB: 09-Oct-1940  Admit date:     04/25/2022  Discharge date: 05/01/22  Discharge Physician: Estill Cotta, MD    PCP: Cassandria Anger, MD   Recommendations at discharge:    Follow up with PCP in 1-2 weeks Follow-up with orthopedics 2 weeks, Dr. Mable Fill for wound check Continue postoperative DVT prophylaxis Lovenox x 6 weeks per orthopedics Started on folic acid and C94 for deficiency  Discharge Diagnoses:    Closed left hip fracture, s/p ORIF on 12/17  Syncope    Controlled type 2 diabetes mellitus with diabetic nephropathy ([place])   Essential hypertension   Stage 3b chronic kidney disease (CKD) ([place]) B12/Folate deficiency    Protein-calorie malnutrition, severe    Hospital Course:  Brandy Gonzales is a 81 y.o. female with past medical history significant for HTN, DM2, CKD stage IIIb who presented to [facility] ED on 12/16 with complaints of left hip pain following fall at home.  Patient reports she was unable to bear weight on her left leg due to severe hip pain.  Workup in the ED notable for comminuted left femoral intertrochanteric fracture.  Orthopedics was consulted and patient underwent ORIF by Dr. Mable Fill on 12/17.  On postoperative day #2, while working with physical therapy patient had a brief syncopal episode; code stroke was initially activated in which patient underwent EEG and MRI testing which ruled out acute stroke and neurology has now signed off.  Assessment and Plan:  Left femoral intertrochanteric fracture Patient presenting to ED following mechanical fall at home with inability to bear weight secondary to severe hip pain.  - Workup in the ED with imaging notable for comminuted left femoral intra-articular fracture.  Orthopedics was consulted and patient underwent ORIF on 12/17.  As tolerated left lower extremity. Postoperative DVT prophylaxis with Lovenox 40 mg daily x 6 weeks. Norco as needed  moderate/severe pain.   Discharging to SNF for further rehabilitation.   -Outpatient follow-up with Dr. Mable Fill, orthopedics 2 weeks.   Syncope On postoperative day #2, patient had syncopal episode while working with physical therapy.  Code stroke was initially activated with CT head, MRI and EEG all unremarkable.  No arrhythmias were reported on telemetry.  No further events since.   B12/folate deficiency Folate 5.9, vitamin B12 208. Folic acid 1 mg p.o. daily, Vitamin B12 1000 mcg daily   Essential hypertension Carvedilol 25 mg p.o. twice daily   Type 2 diabetes mellitus Hemoglobin A1c 7.17 April 2022, not optimally controlled.  Home regimen includes Amaryl 2 mg p.o. daily, Farxiga 10 mg p.o. daily.   CKD stage IIIb Stable, baseline creatinine 1.2-1.4.     Pain control - [employer] Controlled Substance Reporting System database was reviewed. and patient was instructed, not to drive, operate heavy machinery, perform activities at heights, swimming or participation in water activities or provide baby-sitting services while on Pain, Sleep and Anxiety Medications; until their outpatient Physician has advised to do so again. Also recommended to not to take more than prescribed Pain, Sleep and Anxiety Medications.  Consultants: ortho, Dr Mable Fill  Procedures performed: ORIF  Disposition: Skilled nursing facility Diet recommendation:  Discharge Diet Orders (From admission, onward)     Start     Ordered   04/30/22 0000  Diet - low sodium heart healthy        04/30/22 1325            DISCHARGE MEDICATION: Allergies as of 05/01/2022  Reactions   Amlodipine Besylate    REACTION: hair loss   Atenolol    REACTION: fluid retention   Enalapril Maleate    REACTION: palpitations   Oxycodone Hcl    REACTION: dizzy   Propoxyphene N-acetaminophen Other (See Comments)   Unknown reaction        Medication List     TAKE these medications    acetaminophen 500 MG  tablet Commonly known as: TYLENOL Take 500 mg by mouth every 6 (six) hours as needed for moderate pain.   aspirin EC 81 MG tablet Take 81 mg by mouth daily.   carvedilol 25 MG tablet Commonly known as: COREG Take 1 tablet (25 mg total) by mouth 2 (two) times daily with a meal.   cyanocobalamin 1000 MCG tablet Take 1 tablet (1,000 mcg total) by mouth daily. Start taking on: May 02, 2022   dapagliflozin propanediol 10 MG Tabs tablet Commonly known as: Farxiga Take 1 tablet (10 mg total) by mouth daily before breakfast.   enoxaparin 40 MG/0.4ML injection Commonly known as: LOVENOX Inject 0.4 mLs (40 mg total) into the skin daily.   folic acid 1 MG tablet Commonly known as: FOLVITE Take 1 tablet (1 mg total) by mouth daily.   glimepiride 2 MG tablet Commonly known as: AMARYL Take 1 tablet (2 mg total) by mouth daily before breakfast.   HYDROcodone-acetaminophen 5-325 MG tablet Commonly known as: NORCO/VICODIN Take 1 tablet by mouth every 6 (six) hours as needed for severe pain (POSTOPERATIVE PAIN).   mupirocin ointment 2 % Commonly known as: BACTROBAN On leg wound w/dressing change qd or bid What changed:  how much to take how to take this when to take this reasons to take this additional instructions   omeprazole 20 MG capsule Commonly known as: PRILOSEC Take 1 capsule (20 mg total) by mouth daily.   thiamine 100 MG tablet Commonly known as: Vitamin B-1 Take 1 tablet (100 mg total) by mouth daily.   torsemide 20 MG tablet Commonly known as: DEMADEX Take 1-2 tablets (20-40 mg total) by mouth daily as needed. What changed: reasons to take this   Vitamin D3 50 MCG (2000 UT) capsule Take 1 capsule (2,000 Units total) by mouth daily.        Contact information for follow-up providers     Georgeanna Harrison, MD Follow up in 2 week(s).   Specialty: Orthopedic Surgery Contact information: [place] [address] [phone]               Contact information for after-discharge care     [facility] .   Service: Skilled Nursing Contact information: [place] [phone]                    Discharge Exam: Danley Danker Weights   04/25/22 2340 04/28/22 0621 04/29/22 0600  Weight: 51.9 kg 59.4 kg 61.3 kg   S: No acute complaints, waiting for discharge today.   BP (!) 150/84 (BP Location: Right Arm)   Pulse 81   Temp 98.2 F (36.8 C) (Oral)   Resp 19   Ht 4\' 11"  (1.499 m)   Wt 61.3 kg   SpO2 100%   BMI 27.30 kg/m   Physical Exam General: Alert and oriented x 3, NAD Cardiovascular: S1 S2 clear, RRR.  Respiratory: CTAB, no wheezing, rales or rhonchi  Gastrointestinal: Soft, nontender, nondistended, NBS Ext: no pedal edema bilaterally Neuro: no new deficits Psych: Normal affect and demeanor, alert and oriented x3      Condition at discharge: fair  The results of significant diagnostics from this hospitalization (including imaging, microbiology, ancillary and laboratory) are listed below for reference.   Imaging Studies: MR BRAIN WO CONTRAST  Result Date: 04/27/2022 CLINICAL DATA:  Altered mental status EXAM: MRI HEAD WITHOUT CONTRAST TECHNIQUE: Multiplanar, multiecho pulse sequences of the brain and surrounding structures were obtained without intravenous contrast. COMPARISON:  Same-day CT/CTA head and neck, brain MRI 10/17/2021 FINDINGS: Image quality is intermittently motion degraded. Specifically, the axial FLAIR sequence is moderately motion degraded. Brain: There is no evidence of acute intracranial hemorrhage, extra-axial fluid collection, or acute territorial infarct. Tiny foci of SWI signal abnormality in the high right frontoparietal region are favored artifactual. There is unchanged parenchymal volume loss with prominence of the ventricular  system and extra-axial CSF spaces. There is extensive patchy and confluence FLAIR signal abnormality throughout the supratentorial brain consistent with advanced chronic small-vessel ischemic change. Encephalomalacia in the left frontal lobe consistent with prior infarct is unchanged. Small remote infarcts in the bilateral cerebellar hemispheres are unchanged. Old hemorrhage in the bilateral parietal lobes is unchanged likely associated with prior infarcts There is no mass lesion.  There is no mass effect or midline shift. Vascular: Normal flow voids. Skull and upper cervical spine: Normal marrow signal. Sinuses/Orbits: The paranasal sinuses are clear. Bilateral lens implants are in place. The globes and orbits are otherwise unremarkable. Other: None. IMPRESSION: 1. No definite evidence of acute intracranial pathology, within the confines of motion degraded images. 2. Stable chronic findings as above. Electronically Signed   By: Valetta Mole M.D.   On: 04/27/2022 21:37   EEG adult  Result Date: 04/27/2022 Lora Havens, MD     04/27/2022  2:48 PM Patient Name: JONELL BRUMBAUGH MRN: [id number] Epilepsy Attending: Lora Havens Referring Physician/Provider: Donnetta Simpers, MD Date: 04/27/2022 Duration: 22.03 mins Patient history: 81 year old female with presyncopal episode this morning now with aphasia.  EEG to evaluate for seizure. Level of alertness: Awake AEDs during EEG study: None Technical aspects: This EEG study was done with scalp electrodes positioned according to the 10-20 International system of electrode placement. Electrical activity was reviewed with band pass filter of 1-70Hz , sensitivity of 7 uV/mm, display speed of 84mm/sec with a 60Hz  notched filter applied as appropriate. EEG data were recorded continuously and digitally stored.  Video monitoring was available and reviewed as appropriate. Description: No clear posterior dominant rhythm was seen.  EEG showed continuous generalized  predominantly 5 to 7 Hz theta slowing admixed with intermittent generalized 2 to 3 Hz delta slowing. Hyperventilation and photic stimulation were not performed.   ABNORMALITY - Continuous slow, generalized IMPRESSION: This study is suggestive of moderate diffuse encephalopathy, nonspecific etiology. No seizures or epileptiform discharges were seen throughout the recording. Priyanka Barbra Sarks   CT ANGIO HEAD NECK W WO CM (CODE STROKE)  Result Date: 04/27/2022 CLINICAL DATA:  Aphasia out of proportion to Encephalopathy. EXAM: CT ANGIOGRAPHY HEAD AND NECK TECHNIQUE: Multidetector CT imaging of the head and neck was performed using the standard protocol during bolus administration of intravenous contrast. Multiplanar CT image reconstructions and MIPs were obtained to evaluate the vascular anatomy. Carotid stenosis measurements (when applicable) are obtained utilizing NASCET criteria, using the distal internal carotid diameter as the denominator. RADIATION DOSE REDUCTION: This exam was performed according to the departmental dose-optimization  program which includes automated exposure control, adjustment of the mA and/or kV according to patient size and/or use of iterative reconstruction technique. CONTRAST:  1mL OMNIPAQUE IOHEXOL 350 MG/ML SOLN COMPARISON:  Head CT earlier same day.  MRI 11/16/2021 FINDINGS: CTA NECK FINDINGS Aortic arch: Aortic atherosclerosis. Branching pattern is normal without origin stenosis. Right carotid system: Common carotid artery widely patent to the bifurcation. Calcified plaque at the carotid bifurcation and ICA bulb but no stenosis. Cervical ICA widely patent. Left carotid system: Common carotid artery widely patent to the bifurcation. No plaque at the bifurcation or ICA bulb. No ICA stenosis. Vertebral arteries: Both vertebral arteries widely patent at their origins and through the cervical region to the foramen magnum. Skeleton: Chronic cervical spondylosis. Other neck: No mass or  lymphadenopathy. Upper chest: Normal Review of the MIP images confirms the above findings CTA HEAD FINDINGS Anterior circulation: Both internal carotid arteries are patent through the skull base and siphon regions. There is ordinary siphon atherosclerotic calcification but no stenosis greater than 30-50%. The anterior and middle cerebral vessels are patent. No large vessel occlusion or proximal stenosis. No aneurysm or vascular malformation. Posterior circulation: Both vertebral arteries widely patent to the basilar. No basilar stenosis. Posterior circulation branch vessels are normal. Venous sinuses: Patent and normal. Anatomic variants: None significant. Review of the MIP images confirms the above findings IMPRESSION: 1. No large vessel occlusion or proximal stenosis. 2. Aortic atherosclerosis. 3. Atherosclerotic change at the right carotid bifurcation but no stenosis. 4. Atherosclerotic disease in both carotid siphon regions but without stenosis greater than 30-50% suspected. Aortic Atherosclerosis (ICD10-I70.0). Electronically Signed   By: Nelson Chimes M.D.   On: 04/27/2022 13:10   CT HEAD CODE STROKE WO CONTRAST  Result Date: 04/27/2022 CLINICAL DATA:  Code stroke. Neuro deficit, acute, stroke suspected. EXAM: CT HEAD WITHOUT CONTRAST TECHNIQUE: Contiguous axial images were obtained from the base of the skull through the vertex without intravenous contrast. RADIATION DOSE REDUCTION: This exam was performed according to the departmental dose-optimization program which includes automated exposure control, adjustment of the mA and/or kV according to patient size and/or use of iterative reconstruction technique. COMPARISON:  11/16/2021 FINDINGS: Brain: Generalized atrophy. No focal abnormality seen affecting the brainstem or cerebellum. Extensive chronic appearing small vessel ischemic changes of the cerebral hemispheric white matter. No sign of acute large vessel territory stroke. No mass lesion, hemorrhage,  hydrocephalus or extra-axial collection. Vascular: There is atherosclerotic calcification of the major vessels at the base of the brain. Skull: Negative Sinuses/Orbits: Clear/normal Other: None ASPECTS ([place] Stroke Program Early CT Score) - Ganglionic level infarction (caudate, lentiform nuclei, internal capsule, insula, M1-M3 cortex): 7 - Supraganglionic infarction (M4-M6 cortex): 3 Total score (0-10 with 10 being normal): 10 IMPRESSION: 1. No acute CT finding. Atrophy and extensive chronic small-vessel ischemic changes of the white matter. 2. Aspects is 10. These results were communicated to Dr. Lorrin Goodell at 12:54 pm on 04/27/2022 by text page via the [facility] messaging system. Electronically Signed   By: Nelson Chimes M.D.   On: 04/27/2022 12:55   DG FEMUR MIN 2 VIEWS LEFT  Result Date: 04/26/2022 CLINICAL DATA:  Left hip ORIF EXAM: LEFT FEMUR 2 VIEWS COMPARISON:  04/25/2022 FINDINGS: Interval postsurgical changes from ORIF of intertrochanteric left femur fracture with long intramedullary rod, proximal lag screw, and distal interlocking screws. Anatomic alignment at the fracture site. No new fractures. No malalignment. Expected postoperative changes within the overlying soft tissues. IMPRESSION: Interval ORIF of intertrochanteric left femur fracture. No postoperative  complications are evident. Electronically Signed   By: Davina Poke D.O.   On: 04/26/2022 14:11   DG FEMUR MIN 2 VIEWS LEFT  Result Date: 04/26/2022 CLINICAL DATA:  Left hip surgery EXAM: LEFT FEMUR 2 VIEWS COMPARISON:  04/25/2022 FINDINGS: Six C-arm fluoroscopic images were obtained intraoperatively and submitted for post operative interpretation. Images demonstrate placement of ORIF hardware traversing proximal left femur fracture with improved alignment. 105 seconds fluoroscopy time utilized. Radiation dose: 6.36 mGy. Please see the performing provider's procedural report for further detail. IMPRESSION: Intraoperative fluoroscopy  provided for left hip ORIF. Electronically Signed   By: Davina Poke D.O.   On: 04/26/2022 13:31   DG C-Arm 1-60 Min-No Report  Result Date: 04/26/2022 Fluoroscopy was utilized by the requesting physician.  No radiographic interpretation.   DG Femur Min 2 Views Left  Result Date: 04/25/2022 CLINICAL DATA:  Left hip fracture. EXAM: LEFT FEMUR 2 VIEWS COMPARISON:  Earlier same day hip radiograph at 1915 hours FINDINGS: Comminuted intertrochanteric fracture, better visualized on earlier same day radiograph secondary to overlying soft tissues. No other acute fracture of the left femur. Partially visualized intramedullary rod with proximal locking screws in the tibia. Small ossification noted adjacent to the medial femoral condyle, likely sequela from prior trauma. No suprapatellar knee joint effusion. Vascular calcifications are seen in the left thigh and left lower leg. IMPRESSION: Acute comminuted intertrochanteric fracture, better visualized on earlier same day hip radiograph due to overlying soft tissues. No other acute fractures of the left femur. Electronically Signed   By: Ileana Roup M.D.   On: 04/25/2022 20:38   DG Chest 1 View  Result Date: 04/25/2022 CLINICAL DATA:  Fall EXAM: CHEST  1 VIEW COMPARISON:  Chest x-ray 09/27/2021 FINDINGS: The heart size and mediastinal contours are within normal limits. Both lungs are clear. The visualized skeletal structures are unremarkable. IMPRESSION: No active disease. Electronically Signed   By: Ronney Asters M.D.   On: 04/25/2022 19:49   DG Hip Unilat W or Wo Pelvis 2-3 Views Left  Result Date: 04/25/2022 CLINICAL DATA:  Hip pain, fall. EXAM: DG HIP (WITH OR WITHOUT PELVIS) 2-3V LEFT COMPARISON:  None. FINDINGS: There is a comminuted left femoral intratrochanteric fracture with apex lateral angulation and overlying soft tissue swelling. There is no dislocation identified. IMPRESSION: Comminuted left femoral intratrochanteric fracture.  Electronically Signed   By: Ronney Asters M.D.   On: 04/25/2022 19:44    Microbiology: Results for orders placed or performed during the hospital encounter of 04/25/22  Surgical PCR screen     Status: None   Collection Time: 04/25/22 11:56 PM   Specimen: Nasal Mucosa; Nasal Swab  Result Value Ref Range Status   MRSA, PCR NEGATIVE NEGATIVE Final   Staphylococcus aureus NEGATIVE NEGATIVE Final    Comment: (NOTE) The Xpert SA Assay (FDA approved for NASAL specimens in patients 52 years of age and older), is one component of a comprehensive surveillance program. It is not intended to diagnose infection nor to guide or monitor treatment. Performed at [facility], [place] [address], [place], [place] [address]     Labs: CBC: Recent Labs  Lab 04/25/22 1857 04/26/22 0136 04/27/22 0311 04/28/22 0350 04/29/22 0345  WBC 6.6 7.4 9.8 9.0 7.0  NEUTROABS 5.2  --   --   --   --   HGB 12.1 11.8* 10.2* 10.0* 9.7*  HCT 36.8 34.7* 29.4* 29.7* 28.4*  MCV 91.1 89.0 89.1 89.2 90.4  PLT 173 158 126* 146*  450*   Basic Metabolic Panel: Recent Labs  Lab 04/25/22 1857 04/26/22 0234 04/27/22 0311 04/28/22 0350 04/29/22 0345  NA 143 143 138 139 140  K 3.6 3.7 3.6 3.6 3.5  CL 109 110 107 104 112*  CO2 27 23 23  21* 21*  GLUCOSE 244* 165* 142* 162* 153*  BUN 14 12 16 20 21   CREATININE 1.46* 1.25* 1.48* 1.68* 1.43*  CALCIUM 9.7 9.7 9.1 9.3 8.6*  MG  --   --   --  1.8  --   PHOS  --   --   --  3.3  --    Liver Function Tests: Recent Labs  Lab 04/25/22 1857  AST 30  ALT 31  ALKPHOS 117  BILITOT 1.1  PROT 5.8*  ALBUMIN 3.5   CBG: Recent Labs  Lab 04/30/22 0637 04/30/22 1251 04/30/22 1704 04/30/22 2124 05/01/22 0620  GLUCAP 160* 204* 263* 233* 196*    Discharge time spent: greater than 30 minutes.  Signed: Estill Cotta, MD [facility] 05/01/2022

## 2022-05-02 DIAGNOSIS — J09X1 Influenza due to identified novel influenza A virus with pneumonia: Secondary | ICD-10-CM

## 2022-05-02 DIAGNOSIS — S72002A Fracture of unspecified part of neck of left femur, initial encounter for closed fracture: Secondary | ICD-10-CM | POA: Diagnosis not present

## 2022-05-02 DIAGNOSIS — N1832 Chronic kidney disease, stage 3b: Secondary | ICD-10-CM | POA: Diagnosis not present

## 2022-05-02 DIAGNOSIS — E43 Unspecified severe protein-calorie malnutrition: Secondary | ICD-10-CM | POA: Diagnosis not present

## 2022-05-02 DIAGNOSIS — I10 Essential (primary) hypertension: Secondary | ICD-10-CM | POA: Diagnosis not present

## 2022-05-02 HISTORY — DX: Influenza due to identified novel influenza A virus with pneumonia: J09.X1

## 2022-05-02 LAB — GLUCOSE, CAPILLARY
Glucose-Capillary: 156 mg/dL — ABNORMAL HIGH (ref 70–99)
Glucose-Capillary: 156 mg/dL — ABNORMAL HIGH (ref 70–99)
Glucose-Capillary: 258 mg/dL — ABNORMAL HIGH (ref 70–99)
Glucose-Capillary: 144 mg/dL — ABNORMAL HIGH (ref 70–99)
Glucose-Capillary: 160 mg/dL — ABNORMAL HIGH (ref 70–99)

## 2022-05-02 MED ORDER — HYDRALAZINE HCL 20 MG/ML IJ SOLN: 10.0000 mg | Freq: Four times a day (QID) | INTRAMUSCULAR | Status: DC | PRN

## 2022-05-02 NOTE — Plan of Care (Signed)
  Problem: Education: Goal: Ability to describe self-care measures that may prevent or decrease complications (Diabetes Survival Skills Education) will improve Outcome: Progressing Goal: Individualized Educational Video(s) Outcome: Progressing   Problem: Fluid Volume: Goal: Ability to maintain a balanced intake and output will improve Outcome: Progressing   Problem: Health Behavior/Discharge Planning: Goal: Ability to identify and utilize available resources and services will improve Outcome: Progressing Goal: Ability to manage health-related needs will improve Outcome: Progressing   Problem: Health Behavior/Discharge Planning: Goal: Ability to identify and utilize available resources and services will improve Outcome: Progressing Goal: Ability to manage health-related needs will improve Outcome: Progressing   Problem: Metabolic: Goal: Ability to maintain appropriate glucose levels will improve Outcome: Progressing   Problem: Nutritional: Goal: Maintenance of adequate nutrition will improve Outcome: Progressing Goal: Progress toward achieving an optimal weight will improve Outcome: Progressing   Problem: Skin Integrity: Goal: Risk for impaired skin integrity will decrease Outcome: Progressing   Problem: Tissue Perfusion: Goal: Adequacy of tissue perfusion will improve Outcome: Progressing

## 2022-05-02 NOTE — Progress Notes (Signed)
[facility]                                                                              Brandy Gonzales, is a 81 y.o. female, DOB - 06/06/1940, [id number] Admit date - 04/25/2022    Outpatient Primary MD for the patient is [name], Evie Lacks, MD  LOS - 7  days  No chief complaint on file.      Brief summary   Brandy Gonzales is a 81 y.o. female with past medical history significant for HTN, DM2, CKD stage IIIb who presented to [facility] ED on 12/16 with complaints of left hip pain following fall at home.  Patient reports she was unable to bear weight on her left leg due to severe hip pain.  Workup in the ED notable for comminuted left femoral intertrochanteric fracture.  Orthopedics was consulted and patient underwent ORIF by Dr. Mable Gonzales on 12/17.  On postoperative day #2, while working with physical therapy patient had a brief syncopal episode; code stroke was initially activated in which patient underwent EEG and MRI testing which ruled out acute stroke and neurology has now signed off.     Assessment & Plan    Principal Problem:  Left femoral intertrochanteric fracture Patient presented to ED following mechanical fall at home with inability to bear weight secondary to severe hip pain.  - Workup in the ED with imaging notable for comminuted left femoral intra-articular fracture. - Orthopedics was consulted and patient underwent ORIF on 12/17.  As tolerated left lower extremity. Postoperative DVT prophylaxis with Lovenox 40 mg daily x 6 weeks. Norco as needed moderate/severe pain.   -Outpatient follow-up with Dr. Mable Gonzales, orthopedics 2 weeks. -To SNF once Tamiflu is completed   Active problems Influenza A positive -Patient spiked low-grade fevers 100 F overnight on 12/22, COVID-19 negative, positive for influenza A. RSV negative -Started on Tamiflu, 30 mg daily for 5 days with renal dosing per pharmacy   Syncope -On postoperative day #2, patient had syncopal episode  while working with physical therapy.  Code stroke was initially activated with CT head, MRI and EEG all unremarkable.  No arrhythmias were reported on telemetry.  No further events since.   B12/folate deficiency Folate 5.9, vitamin B12 458. -Continue folic acid 1 mg daily, vitamin B12 1000 mcg daily   Essential hypertension Continue Coreg 25 mg p.o. twice daily  -BP readings elevated, added hydralazine IV as needed with parameters   Type 2 diabetes mellitus Hemoglobin A1c 7.17 April 2022, not optimally controlled.  Home regimen includes Amaryl 2 mg p.o. daily, Farxiga 10 mg p.o. daily. -Continue sliding scale insulin while inpatient    CKD stage IIIb Stable, baseline creatinine 1.2-1.4.      Protein-calorie malnutrition, severe - Etiology: chronic illness Estimated body mass index is 27.3 kg/m as calculated from the following:   Height as of this encounter: 4\' 11"  (1.499 m).   Weight as of this encounter: 61.3 kg.  Code Status: Full CODE STATUS DVT Prophylaxis:  enoxaparin (LOVENOX) injection 40 mg Start: 04/27/22 1000 SCDs Start: 04/26/22 1451 SCDs Start: 04/25/22 2015   Level of Care: Level of care:  Med-Surg Family Communication: Updated patient's daughter, Brandy Gonzales today    Disposition Plan:      Remains inpatient appropriate: Plan to complete Tamiflu, then discharge to SNF   Procedures:  ORIF  Consultants:   Orthopedics  Antimicrobials:   Anti-infectives (From admission, onward)    Start     Dose/Rate Route Frequency Ordered Stop   05/01/22 1330  oseltamivir (TAMIFLU) capsule 30 mg        30 mg Oral Daily 05/01/22 1325 05/06/22 0959   04/26/22 1800  ceFAZolin (ANCEF) IVPB 2g/100 mL premix        2 g 200 mL/hr over 30 Minutes Intravenous Every 6 hours 04/26/22 1450 04/27/22 0936   04/26/22 1238  vancomycin (VANCOCIN) powder  Status:  Discontinued          As needed 04/26/22 1238 04/26/22 1329   04/26/22 0600  ceFAZolin (ANCEF) IVPB 2g/100 mL premix         2 g 200 mL/hr over 30 Minutes Intravenous To Short Stay 04/25/22 2350 04/26/22 1125          Medications  carvedilol  25 mg Oral BID WC   vitamin B-12  1,000 mcg Oral Daily   docusate sodium  100 mg Oral BID   enoxaparin (LOVENOX) injection  40 mg Subcutaneous B51W   folic acid  1 mg Oral Daily   Or   folic acid  1 mg Intravenous Daily   insulin aspart  0-5 Units Subcutaneous QHS   insulin aspart  0-9 Units Subcutaneous TID WC   oseltamivir  30 mg Oral Daily   thiamine (VITAMIN B1) injection  100 mg Intravenous Daily   Or   thiamine  100 mg Oral Daily      Subjective:   Brandy Gonzales was seen and examined today.  No fevers overnight.  BP readings elevated.  No acute nausea vomiting, chest pain or shortness of breath.   No acute events overnight.    Objective:   Vitals:   05/01/22 2008 05/01/22 2333 05/02/22 0405 05/02/22 0814  BP: (!) 144/66 (!) 178/86 (!) 162/79 (!) 153/68  Pulse: 77 62 77 78  Resp: 16 16 16 17   Temp: 100 F (37.8 C) 98.7 F (37.1 C) 99.4 F (37.4 C) 99.3 F (37.4 C)  TempSrc: Oral Axillary Oral Oral  SpO2:    100%  Weight:      Height:        Intake/Output Summary (Last 24 hours) at 05/02/2022 1051 Last data filed at 05/01/2022 2335 Gross per 24 hour  Intake 150 ml  Output 1350 ml  Net -1200 ml     Wt Readings from Last 3 Encounters:  04/29/22 61.3 kg  04/22/22 55.9 kg  01/21/22 59.3 kg     Exam General: Alert and oriented x 3, NAD Cardiovascular: S1 S2 auscultated,  RRR Respiratory: Clear to auscultation bilaterally Gastrointestinal: Soft, nontender, nondistended, + bowel sounds Ext: no pedal edema bilaterally Neuro: no new FND's Psych: Normal affect and demeanor, alert and oriented x3     Data Reviewed:  I have personally reviewed following labs    CBC Lab Results  Component Value Date   WBC 7.0 04/29/2022   RBC 3.14 (L) 04/29/2022   HGB 9.7 (L) 04/29/2022   HCT 28.4 (L) 04/29/2022   MCV 90.4 04/29/2022    MCH 30.9 04/29/2022   PLT 135 (L) 04/29/2022   MCHC 34.2 04/29/2022   RDW 14.6 04/29/2022   LYMPHSABS 0.9 04/25/2022  MONOABS 0.4 04/25/2022   EOSABS 0.1 04/25/2022   BASOSABS 0.0 99/14/4458     Last metabolic panel Lab Results  Component Value Date   NA 140 04/29/2022   K 3.5 04/29/2022   CL 112 (H) 04/29/2022   CO2 21 (L) 04/29/2022   BUN 21 04/29/2022   CREATININE 1.43 (H) 04/29/2022   GLUCOSE 153 (H) 04/29/2022   GFRNONAA 37 (L) 04/29/2022   GFRAA 33 (L) 05/03/2019   CALCIUM 8.6 (L) 04/29/2022   PHOS 3.3 04/28/2022   PROT 5.8 (L) 04/25/2022   ALBUMIN 3.5 04/25/2022   BILITOT 1.1 04/25/2022   ALKPHOS 117 04/25/2022   AST 30 04/25/2022   ALT 31 04/25/2022   ANIONGAP 7 04/29/2022    CBG (last 3)  Recent Labs    05/01/22 2117 05/02/22 0612 05/02/22 0815  GLUCAP 147* 156* 156*      Coagulation Profile: No results for input(s): "INR", "PROTIME" in the last 168 hours.   Radiology Studies: I have personally reviewed the imaging studies  No results found.     Estill Cotta M.D. [facility] 05/02/2022, 10:51 AM  Available via Epic secure chat 7am-7pm After 7 pm, please refer to night coverage provider listed on amion.

## 2022-05-03 DIAGNOSIS — S72002A Fracture of unspecified part of neck of left femur, initial encounter for closed fracture: Secondary | ICD-10-CM | POA: Diagnosis not present

## 2022-05-03 LAB — GLUCOSE, CAPILLARY
Glucose-Capillary: 142 mg/dL — ABNORMAL HIGH (ref 70–99)
Glucose-Capillary: 304 mg/dL — ABNORMAL HIGH (ref 70–99)
Glucose-Capillary: 133 mg/dL — ABNORMAL HIGH (ref 70–99)
Glucose-Capillary: 125 mg/dL — ABNORMAL HIGH (ref 70–99)

## 2022-05-03 LAB — CBC
HCT: 27.7 % — ABNORMAL LOW (ref 36.0–46.0)
Hemoglobin: 9.2 g/dL — ABNORMAL LOW (ref 12.0–15.0)
MCH: 30 pg (ref 26.0–34.0)
MCHC: 33.2 g/dL (ref 30.0–36.0)
MCV: 90.2 fL (ref 80.0–100.0)
Platelets: 148 10*3/uL — ABNORMAL LOW (ref 150–400)
RBC: 3.07 MIL/uL — ABNORMAL LOW (ref 3.87–5.11)
RDW: 14.5 % (ref 11.5–15.5)
WBC: 4.3 10*3/uL (ref 4.0–10.5)
nRBC: 0 % (ref 0.0–0.2)

## 2022-05-03 LAB — BASIC METABOLIC PANEL
Anion gap: 5 (ref 5–15)
BUN: 16 mg/dL (ref 8–23)
CO2: 24 mmol/L (ref 22–32)
Calcium: 8.9 mg/dL (ref 8.9–10.3)
Chloride: 109 mmol/L (ref 98–111)
Creatinine, Ser: 1.11 mg/dL — ABNORMAL HIGH (ref 0.44–1.00)
GFR, Estimated: 50 mL/min — ABNORMAL LOW (ref >60)
Glucose, Bld: 162 mg/dL — ABNORMAL HIGH (ref 70–99)
Potassium: 4.6 mmol/L (ref 3.5–5.1)
Sodium: 138 mmol/L (ref 135–145)

## 2022-05-03 LAB — MAGNESIUM: Magnesium: 1.5 mg/dL — ABNORMAL LOW (ref 1.7–2.4)

## 2022-05-03 NOTE — Progress Notes (Signed)
[facility]                                                                              Tonni Mansour, is a 81 y.o. female, DOB - June 10, 1940, [id number] Admit date - 04/25/2022    Outpatient Primary MD for the patient is [name], Evie Lacks, MD  LOS - 8  days  No chief complaint on file.      Brief summary   Brandy Gonzales is a 81 y.o. female with past medical history significant for HTN, DM2, CKD stage IIIb who presented to [facility] ED on 12/16 with complaints of left hip pain following fall at home.  Patient reports she was unable to bear weight on her left leg due to severe hip pain.  Workup in the ED notable for comminuted left femoral intertrochanteric fracture.  Orthopedics was consulted and patient underwent ORIF by Dr. Mable Fill on 12/17.  On postoperative day #2, while working with physical therapy patient had a brief syncopal episode; code stroke was initially activated in which patient underwent EEG and MRI testing which ruled out acute stroke and neurology has now signed off.     Assessment & Plan    Principal Problem:  Left femoral intertrochanteric fracture Patient presented to ED following mechanical fall at home with inability to bear weight secondary to severe hip pain.  - Workup in the ED with imaging notable for comminuted left femoral intra-articular fracture. - Orthopedics was consulted and patient underwent ORIF on 12/17.  As tolerated left lower extremity. Postoperative DVT prophylaxis with Lovenox 40 mg daily x 6 weeks. Norco as needed moderate/severe pain.   -Outpatient follow-up with Dr. Mable Fill, orthopedics 2 weeks. -To SNF once Tamiflu is completed   Active problems Influenza A positive -Patient spiked low-grade fevers 100 F overnight on 12/22, COVID-19 negative, positive for influenza A. RSV negative -Started on Tamiflu, 30 mg daily for 5 days with renal dosing per pharmacy   Syncope -On postoperative day #2, patient had syncopal episode  while working with physical therapy.  Code stroke was initially activated with CT head, MRI and EEG all unremarkable.  No arrhythmias were reported on telemetry.  No further events since.   B12 and folate deficiency Folate 5.9, vitamin B12 425. -Continue folic acid 1 mg daily, vitamin B12 1000 mcg daily   Essential hypertension Continue Coreg 25 mg p.o. twice daily  -BP readings elevated, added hydralazine IV as needed with parameters   Type 2 diabetes mellitus Hemoglobin A1c 7.17 April 2022, not optimally controlled.  Home regimen includes Amaryl 2 mg p.o. daily, Farxiga 10 mg p.o. daily. -Continue sliding scale insulin while inpatient    AKI on CKD stage IIIb - baseline creatinine 1.2-1.4.  -Creatinine 1.68 on 12/19, resolved, improved to 1.1 today    Protein-calorie malnutrition, severe - Etiology: chronic illness Estimated body mass index is 26.72 kg/m as calculated from the following:   Height as of this encounter: 4\' 11"  (1.499 m).   Weight as of this encounter: 60 kg.  Code Status: Full CODE STATUS DVT Prophylaxis:  enoxaparin (LOVENOX) injection 40 mg Start: 04/27/22 1000 SCDs Start: 04/26/22 1451  SCDs Start: 04/25/22 2015   Level of Care: Level of care: Med-Surg Family Communication: Updated patient's daughter, Brandy Gonzales on 12/23  Disposition Plan:      Remains inpatient appropriate: Discharge to SNF once Tamiflu completed    Procedures:  ORIF  Consultants:   Orthopedics  Antimicrobials:   Anti-infectives (From admission, onward)    Start     Dose/Rate Route Frequency Ordered Stop   05/01/22 1330  oseltamivir (TAMIFLU) capsule 30 mg        30 mg Oral Daily 05/01/22 1325 05/06/22 0959   04/26/22 1800  ceFAZolin (ANCEF) IVPB 2g/100 mL premix        2 g 200 mL/hr over 30 Minutes Intravenous Every 6 hours 04/26/22 1450 04/27/22 0936   04/26/22 1238  vancomycin (VANCOCIN) powder  Status:  Discontinued          As needed 04/26/22 1238 04/26/22 1329    04/26/22 0600  ceFAZolin (ANCEF) IVPB 2g/100 mL premix        2 g 200 mL/hr over 30 Minutes Intravenous To Short Stay 04/25/22 2350 04/26/22 1125          Medications  carvedilol  25 mg Oral BID WC   vitamin B-12  1,000 mcg Oral Daily   docusate sodium  100 mg Oral BID   enoxaparin (LOVENOX) injection  40 mg Subcutaneous G62I   folic acid  1 mg Oral Daily   Or   folic acid  1 mg Intravenous Daily   insulin aspart  0-5 Units Subcutaneous QHS   insulin aspart  0-9 Units Subcutaneous TID WC   oseltamivir  30 mg Oral Daily   thiamine (VITAMIN B1) injection  100 mg Intravenous Daily   Or   thiamine  100 mg Oral Daily      Subjective:   Brandy Gonzales was seen and examined today.  No fevers, doing well, no acute complaints.  No nausea vomiting abdominal pain or diarrhea.  Objective:   Vitals:   05/03/22 0447 05/03/22 0600 05/03/22 0807 05/03/22 1158  BP: (!) 153/72  (!) 143/68 (!) 105/52  Pulse: 71  70 78  Resp: 16  17 18   Temp: 98.3 F (36.8 C)  98.9 F (37.2 C) 97.6 F (36.4 C)  TempSrc: Axillary  Oral Oral  SpO2: 99%  100% 99%  Weight:  60 kg    Height:        Intake/Output Summary (Last 24 hours) at 05/03/2022 1234 Last data filed at 05/03/2022 0100 Gross per 24 hour  Intake --  Output 1700 ml  Net -1700 ml     Wt Readings from Last 3 Encounters:  05/03/22 60 kg  04/22/22 55.9 kg  01/21/22 59.3 kg    Physical Exam General: Alert and oriented x 3, NAD Cardiovascular: S1 S2 clear, RRR.  Respiratory: CTAB, no wheezing Gastrointestinal: Soft, nontender, nondistended, NBS Ext: no pedal edema bilaterally Neuro: no new deficits Skin: No rashes Psych: Normal affect     Data Reviewed:  I have personally reviewed following labs    CBC Lab Results  Component Value Date   WBC 4.3 05/03/2022   RBC 3.07 (L) 05/03/2022   HGB 9.2 (L) 05/03/2022   HCT 27.7 (L) 05/03/2022   MCV 90.2 05/03/2022   MCH 30.0 05/03/2022   PLT 148 (L) 05/03/2022   MCHC 33.2  05/03/2022   RDW 14.5 05/03/2022   LYMPHSABS 0.9 04/25/2022   MONOABS 0.4 04/25/2022   EOSABS 0.1 04/25/2022   BASOSABS  0.0 17/71/1657     Last metabolic panel Lab Results  Component Value Date   NA 138 05/03/2022   K 4.6 05/03/2022   CL 109 05/03/2022   CO2 24 05/03/2022   BUN 16 05/03/2022   CREATININE 1.11 (H) 05/03/2022   GLUCOSE 162 (H) 05/03/2022   GFRNONAA 50 (L) 05/03/2022   GFRAA 33 (L) 05/03/2019   CALCIUM 8.9 05/03/2022   PHOS 3.3 04/28/2022   PROT 5.8 (L) 04/25/2022   ALBUMIN 3.5 04/25/2022   BILITOT 1.1 04/25/2022   ALKPHOS 117 04/25/2022   AST 30 04/25/2022   ALT 31 04/25/2022   ANIONGAP 5 05/03/2022    CBG (last 3)  Recent Labs    05/02/22 2112 05/03/22 0625 05/03/22 1156  GLUCAP 160* 142* 304*      Coagulation Profile: No results for input(s): "INR", "PROTIME" in the last 168 hours.   Radiology Studies: I have personally reviewed the imaging studies  No results found.     Estill Cotta M.D. [facility] 05/03/2022, 12:34 PM  Available via Epic secure chat 7am-7pm After 7 pm, please refer to night coverage provider listed on amion.

## 2022-05-04 ENCOUNTER — Inpatient Hospital Stay (HOSPITAL_COMMUNITY): Payer: Medicare Other

## 2022-05-04 DIAGNOSIS — J09X1 Influenza due to identified novel influenza A virus with pneumonia: Secondary | ICD-10-CM | POA: Diagnosis not present

## 2022-05-04 DIAGNOSIS — M7989 Other specified soft tissue disorders: Secondary | ICD-10-CM | POA: Diagnosis not present

## 2022-05-04 DIAGNOSIS — E43 Unspecified severe protein-calorie malnutrition: Secondary | ICD-10-CM | POA: Diagnosis not present

## 2022-05-04 DIAGNOSIS — S72002A Fracture of unspecified part of neck of left femur, initial encounter for closed fracture: Secondary | ICD-10-CM | POA: Diagnosis not present

## 2022-05-04 DIAGNOSIS — I82402 Acute embolism and thrombosis of unspecified deep veins of left lower extremity: Secondary | ICD-10-CM | POA: Diagnosis not present

## 2022-05-04 DIAGNOSIS — E1121 Type 2 diabetes mellitus with diabetic nephropathy: Secondary | ICD-10-CM | POA: Diagnosis not present

## 2022-05-04 LAB — GLUCOSE, CAPILLARY
Glucose-Capillary: 139 mg/dL — ABNORMAL HIGH (ref 70–99)
Glucose-Capillary: 238 mg/dL — ABNORMAL HIGH (ref 70–99)
Glucose-Capillary: 209 mg/dL — ABNORMAL HIGH (ref 70–99)
Glucose-Capillary: 152 mg/dL — ABNORMAL HIGH (ref 70–99)

## 2022-05-04 MED ORDER — HEPARIN BOLUS VIA INFUSION
1500.0000 [IU] | Freq: Once | INTRAVENOUS | Status: AC
  Administered 2022-05-04: 1500 [IU] via INTRAVENOUS
  Filled 2022-05-04: qty 1500

## 2022-05-04 MED ORDER — HEPARIN (PORCINE) 25000 UT/250ML-% IV SOLN
550.0000 [IU]/h | INTRAVENOUS | Status: AC
  Administered 2022-05-04: 900 [IU]/h via INTRAVENOUS
  Filled 2022-05-04 (×2): qty 250

## 2022-05-04 NOTE — Progress Notes (Signed)
[facility]                                                                              Brandy Gonzales, is a 81 y.o. female, DOB - 01-10-1941, [id number] Admit date - 04/25/2022    Outpatient Primary MD for the patient is Gonzales, Brandy Lacks, MD  LOS - 9  days  No chief complaint on file.      Brief summary   Brandy Gonzales is a 81 y.o. female with past medical history significant for HTN, DM2, CKD stage IIIb who presented to [facility] ED on 12/16 with complaints of left hip pain following fall at home.  Patient reports she was unable to bear weight on her left leg due to severe hip pain.  Workup in the ED notable for comminuted left femoral intertrochanteric fracture.  Orthopedics was consulted and patient underwent ORIF by Dr. Mable Fill on 12/17.  On postoperative day #2, while working with physical therapy patient had a brief syncopal episode; code stroke was initially activated in which patient underwent EEG and MRI testing which ruled out acute stroke and neurology has now signed off.     Assessment & Plan    Principal Problem:  Left femoral intertrochanteric fracture Patient presented to ED following mechanical fall at home with inability to bear weight secondary to severe hip pain.  -Imaging showed comminuted left femoral intra-articular fracture.  - Orthopedics consulted and underwent ORIF on 12/17.  - WBAT left lower extremity. Postoperative DVT prophylaxis with Lovenox 40 mg daily x 6 weeks. Norco as needed moderate/severe pain.   -Outpatient follow-up with Dr. Mable Fill, orthopedics 2 weeks. -To SNF once Tamiflu is completed, day #4/5 on Tamiflu, will complete Tamiflu tomorrow 12/26 -Follow vascular ultrasound lower extremity to rule out DVT, LLE edema    Active problems Influenza A positive -Patient spiked low-grade fevers 100 F overnight on 12/22, COVID-19 negative, positive for influenza A. RSV negative -Started on Tamiflu, 30 mg daily for 5 days, day #4  today   Syncope -On postoperative day #2, patient had syncopal episode while working with physical therapy.  Code stroke was initially activated with CT head, MRI and EEG all unremarkable.  No arrhythmias were reported on telemetry.  No further events since.   B12 and folate deficiency Folate 5.9, vitamin B12 287. -Continue folic acid 1 mg daily, vitamin B12 1000 mcg daily   Essential hypertension BP stable, continue Coreg 25 mg p.o. twice daily    Type 2 diabetes mellitus Hemoglobin A1c 7.17 April 2022, not optimally controlled.  Home regimen includes Amaryl 2 mg p.o. daily, Farxiga 10 mg p.o. daily. -Continue SSI while inpatient    AKI on CKD stage IIIb - baseline creatinine 1.2-1.4.  -Creatinine 1.68 on 12/19, improved at baseline    Protein-calorie malnutrition, severe - Etiology: chronic illness Estimated body mass index is 26.81 kg/m as calculated from the following:   Height as of this encounter: 4\' 11"  (1.499 m).   Weight as of this encounter: 60.2 kg.  Code Status: Full CODE STATUS DVT Prophylaxis:  enoxaparin (LOVENOX) injection 40 mg Start: 04/27/22 1000 SCDs Start: 04/26/22 1451 SCDs  Start: 04/25/22 2015   Level of Care: Level of care: Med-Surg Family Communication: Updated patient's daughter, Brandy Gonzales on 12/23  Disposition Plan:      Remains inpatient appropriate: Discharge to SNF once Tamiflu completed tomorrow 12/26   Procedures:  ORIF  Consultants:   Orthopedics  Antimicrobials:   Anti-infectives (From admission, onward)    Start     Dose/Rate Route Frequency Ordered Stop   05/01/22 1330  oseltamivir (TAMIFLU) capsule 30 mg        30 mg Oral Daily 05/01/22 1325 05/06/22 0959   04/26/22 1800  ceFAZolin (ANCEF) IVPB 2g/100 mL premix        2 g 200 mL/hr over 30 Minutes Intravenous Every 6 hours 04/26/22 1450 04/27/22 0936   04/26/22 1238  vancomycin (VANCOCIN) powder  Status:  Discontinued          As needed 04/26/22 1238 04/26/22 1329    04/26/22 0600  ceFAZolin (ANCEF) IVPB 2g/100 mL premix        2 g 200 mL/hr over 30 Minutes Intravenous To Short Stay 04/25/22 2350 04/26/22 1125          Medications  carvedilol  25 mg Oral BID WC   vitamin B-12  1,000 mcg Oral Daily   docusate sodium  100 mg Oral BID   enoxaparin (LOVENOX) injection  40 mg Subcutaneous B09G   folic acid  1 mg Oral Daily   Or   folic acid  1 mg Intravenous Daily   insulin aspart  0-5 Units Subcutaneous QHS   insulin aspart  0-9 Units Subcutaneous TID WC   oseltamivir  30 mg Oral Daily   thiamine (VITAMIN B1) injection  100 mg Intravenous Daily   Or   thiamine  100 mg Oral Daily      Subjective:   Brandy Gonzales was seen and examined today.  Doing well, no acute complaints fever no fevers.  No coughing, chest pain or shortness of breath.    Objective:   Vitals:   05/03/22 2351 05/04/22 0411 05/04/22 0600 05/04/22 0733  BP: 133/60 135/61  129/63  Pulse: 72 73  72  Resp: 16 16  19   Temp: 98.1 F (36.7 C) 97.8 F (36.6 C)  98.1 F (36.7 C)  TempSrc: Axillary Axillary  Oral  SpO2: 100% 97%  100%  Weight:   60.2 kg   Height:        Intake/Output Summary (Last 24 hours) at 05/04/2022 0857 Last data filed at 05/04/2022 2836 Gross per 24 hour  Intake --  Output 800 ml  Net -800 ml     Wt Readings from Last 3 Encounters:  05/04/22 60.2 kg  04/22/22 55.9 kg  01/21/22 59.3 kg   Physical Exam General: Alert and oriented x 3, NAD Cardiovascular: S1 S2 clear, RRR.  Respiratory: CTAB, no wheezing Gastrointestinal: Soft, nontender, nondistended, NBS Ext: + pedal edema bilaterally, L>R Neuro: no new deficits Psych: Normal affect     Data Reviewed:  I have personally reviewed following labs    CBC Lab Results  Component Value Date   WBC 4.3 05/03/2022   RBC 3.07 (L) 05/03/2022   HGB 9.2 (L) 05/03/2022   HCT 27.7 (L) 05/03/2022   MCV 90.2 05/03/2022   MCH 30.0 05/03/2022   PLT 148 (L) 05/03/2022   MCHC 33.2  05/03/2022   RDW 14.5 05/03/2022   LYMPHSABS 0.9 04/25/2022   MONOABS 0.4 04/25/2022   EOSABS 0.1 04/25/2022   BASOSABS 0.0 04/25/2022  Last metabolic panel Lab Results  Component Value Date   NA 138 05/03/2022   K 4.6 05/03/2022   CL 109 05/03/2022   CO2 24 05/03/2022   BUN 16 05/03/2022   CREATININE 1.11 (H) 05/03/2022   GLUCOSE 162 (H) 05/03/2022   GFRNONAA 50 (L) 05/03/2022   GFRAA 33 (L) 05/03/2019   CALCIUM 8.9 05/03/2022   PHOS 3.3 04/28/2022   PROT 5.8 (L) 04/25/2022   ALBUMIN 3.5 04/25/2022   BILITOT 1.1 04/25/2022   ALKPHOS 117 04/25/2022   AST 30 04/25/2022   ALT 31 04/25/2022   ANIONGAP 5 05/03/2022    CBG (last 3)  Recent Labs    05/03/22 1713 05/03/22 2059 05/04/22 0619  GLUCAP 133* 125* 139*      Coagulation Profile: No results for input(s): "INR", "PROTIME" in the last 168 hours.   Radiology Studies: I have personally reviewed the imaging studies  No results found.     Estill Cotta M.D. [facility] 05/04/2022, 8:57 AM  Available via Epic secure chat 7am-7pm After 7 pm, please refer to night coverage provider listed on amion.

## 2022-05-04 NOTE — Progress Notes (Signed)
BLE venous duplex  and left iliac/IVC has been completed.  Preliminary findings given to Dr. Tana Coast.   Results can be found under chart review under CV PROC. 05/04/2022 5:36 PM Brandy Gonzales RVT, RDMS
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
## 2022-05-05 DIAGNOSIS — I82402 Acute embolism and thrombosis of unspecified deep veins of left lower extremity: Secondary | ICD-10-CM

## 2022-05-05 DIAGNOSIS — S72002A Fracture of unspecified part of neck of left femur, initial encounter for closed fracture: Secondary | ICD-10-CM | POA: Diagnosis not present

## 2022-05-05 DIAGNOSIS — I82409 Acute embolism and thrombosis of unspecified deep veins of unspecified lower extremity: Secondary | ICD-10-CM | POA: Insufficient documentation

## 2022-05-05 DIAGNOSIS — J09X1 Influenza due to identified novel influenza A virus with pneumonia: Secondary | ICD-10-CM | POA: Diagnosis not present

## 2022-05-05 LAB — CBC
HCT: 28.5 % — ABNORMAL LOW (ref 36.0–46.0)
Hemoglobin: 9.4 g/dL — ABNORMAL LOW (ref 12.0–15.0)
MCH: 29.8 pg (ref 26.0–34.0)
MCHC: 33 g/dL (ref 30.0–36.0)
MCV: 90.5 fL (ref 80.0–100.0)
Platelets: 184 10*3/uL (ref 150–400)
RBC: 3.15 MIL/uL — ABNORMAL LOW (ref 3.87–5.11)
RDW: 14.4 % (ref 11.5–15.5)
WBC: 4.4 10*3/uL (ref 4.0–10.5)
nRBC: 0 % (ref 0.0–0.2)

## 2022-05-05 LAB — GLUCOSE, CAPILLARY
Glucose-Capillary: 164 mg/dL — ABNORMAL HIGH (ref 70–99)
Glucose-Capillary: 204 mg/dL — ABNORMAL HIGH (ref 70–99)
Glucose-Capillary: 142 mg/dL — ABNORMAL HIGH (ref 70–99)
Glucose-Capillary: 145 mg/dL — ABNORMAL HIGH (ref 70–99)
Glucose-Capillary: 196 mg/dL — ABNORMAL HIGH (ref 70–99)

## 2022-05-05 LAB — HEPARIN LEVEL (UNFRACTIONATED)
Heparin Unfractionated: 1.04 IU/mL — ABNORMAL HIGH (ref 0.30–0.70)
Heparin Unfractionated: 0.97 IU/mL — ABNORMAL HIGH (ref 0.30–0.70)
Heparin Unfractionated: 0.75 IU/mL — ABNORMAL HIGH (ref 0.30–0.70)

## 2022-05-05 MED ORDER — LIP MEDEX EX OINT: TOPICAL_OINTMENT | CUTANEOUS | Status: DC | PRN
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
## 2022-05-05 NOTE — Progress Notes (Addendum)
[facility]                                                                              Brandy Gonzales, is a 81 y.o. female, DOB - 1941/01/23, LYY:[id number] Admit date - 04/25/2022    Outpatient Primary MD for the patient is [name], Evie Lacks, MD  LOS - 10  days  No chief complaint on file.      Brief summary   Brandy Gonzales is a 81 y.o. female with past medical history significant for HTN, DM2, CKD stage IIIb who presented to [facility] ED on 12/16 with complaints of left hip pain following fall at home.  Patient reports she was unable to bear weight on her left leg due to severe hip pain.  Workup in the ED notable for comminuted left femoral intertrochanteric fracture.  Orthopedics was consulted and patient underwent ORIF by Dr. Mable Fill on 12/17.  On postoperative day #2, while working with physical therapy patient had a brief syncopal episode; code stroke was initially activated in which patient underwent EEG and MRI testing which ruled out acute stroke and neurology has now signed off.     Assessment & Plan    Principal Problem:  Left femoral intertrochanteric fracture Patient presented to ED following mechanical fall at home with inability to bear weight secondary to severe hip pain.  -Imaging showed comminuted left femoral intra-articular fracture.  - Orthopedics consulted and underwent ORIF on 12/17.  - WBAT left lower extremity. Postoperative DVT prophylaxis with Lovenox 40 mg daily x 6 weeks. Norco as needed moderate/severe pain.   -Outpatient follow-up with Dr. Mable Fill, orthopedics 2 weeks.   Active problems Influenza A positive -Patient spiked low-grade fevers 100 F overnight on 12/22, COVID-19 negative, positive for influenza A. RSV negative -Started on Tamiflu, 30 mg daily for 5 days, completing today  Acute extensive left LE DVT -Venous Dopplers showed acute DVT involving the left common femoral, SF junction, left femoral vein, left proximal profunda vein  and left popliteal vein.  No DVT in RLE -Started on IV heparin drip, IR consult for evaluation if patient would be a candidate for thrombectomy versus continue anticoagulation? -If to continue anticoagulation, plan will be to place on DOAC, will have TOC check benefits for eliquis -Hemodynamically stable, no chest pain or hypoxia. - h/h stable  Addendum: 1:20PM D/w IR, Dr Vernard Gambles, recommended to continue anticoagulation, no role for mechanical thrombectomy unless severe phlegmasia -Will start eliquis tomorrow am   Syncope -On postoperative day #2, patient had syncopal episode while working with physical therapy.  Code stroke was initially activated with CT head, MRI and EEG all unremarkable. - No arrhythmias were reported on telemetry.  -No further events since no further events since.   B12 and folate deficiency Folate 5.9, vitamin B12 127. -Continue folic acid 1 mg daily, vitamin B12 1000 mcg daily   Essential hypertension Continue Coreg 25 mg twice daily   Type 2 diabetes mellitus Hemoglobin A1c 7.17 April 2022, not optimally controlled.  Home regimen includes Amaryl 2 mg p.o. daily, Farxiga 10 mg p.o. daily. -Continue SSI while inpatient    AKI on CKD  stage IIIb - baseline creatinine 1.2-1.4. Cr at baseline now     Protein-calorie malnutrition, severe - Etiology: chronic illness Estimated body mass index is 26.81 kg/m as calculated from the following:   Height as of this encounter: 4\' 11"  (1.499 m).   Weight as of this encounter: 60.2 kg.  Code Status: Full CODE STATUS DVT Prophylaxis:  SCDs Start: 04/26/22 1451 SCDs Start: 04/25/22 2015   Level of Care: Level of care: Med-Surg Family Communication: Updated patient's daughter, Brandy Gonzales today 12/26  Disposition Plan:      Remains inpatient appropriate: Hopefully DC to SNF in 24 to 48 hours   Procedures:  ORIF  Consultants:   Orthopedics  Antimicrobials:   Anti-infectives (From admission, onward)     Start     Dose/Rate Route Frequency Ordered Stop   05/01/22 1330  oseltamivir (TAMIFLU) capsule 30 mg        30 mg Oral Daily 05/01/22 1325 05/06/22 0959   04/26/22 1800  ceFAZolin (ANCEF) IVPB 2g/100 mL premix        2 g 200 mL/hr over 30 Minutes Intravenous Every 6 hours 04/26/22 1450 04/27/22 0936   04/26/22 1238  vancomycin (VANCOCIN) powder  Status:  Discontinued          As needed 04/26/22 1238 04/26/22 1329   04/26/22 0600  ceFAZolin (ANCEF) IVPB 2g/100 mL premix        2 g 200 mL/hr over 30 Minutes Intravenous To Short Stay 04/25/22 2350 04/26/22 1125          Medications  carvedilol  25 mg Oral BID WC   vitamin B-12  1,000 mcg Oral Daily   docusate sodium  100 mg Oral BID   folic acid  1 mg Oral Daily   Or   folic acid  1 mg Intravenous Daily   insulin aspart  0-5 Units Subcutaneous QHS   insulin aspart  0-9 Units Subcutaneous TID WC   oseltamivir  30 mg Oral Daily   thiamine (VITAMIN B1) injection  100 mg Intravenous Daily   Or   thiamine  100 mg Oral Daily      Subjective:   Brandy Gonzales was seen and examined today.  No acute complaints including chest pain or shortness of breath, coughing.  Left leg edema persisting, no nausea vomiting  Objective:   Vitals:   05/04/22 2352 05/05/22 0500 05/05/22 0726 05/05/22 0803  BP: 126/64 (!) 156/62 (!) 178/70 (!) 151/57  Pulse: 73 76 77 75  Resp: 16 18 18 17   Temp: 98.7 F (37.1 C) 97.9 F (36.6 C) 99.3 F (37.4 C) 99 F (37.2 C)  TempSrc: Oral Oral Oral Oral  SpO2: 99% 99% 100% 100%  Weight:      Height:        Intake/Output Summary (Last 24 hours) at 05/05/2022 1131 Last data filed at 05/05/2022 0551 Gross per 24 hour  Intake --  Output 700 ml  Net -700 ml     Wt Readings from Last 3 Encounters:  05/04/22 60.2 kg  04/22/22 55.9 kg  01/21/22 59.3 kg   Physical Exam General: Alert and oriented x 3, NAD Cardiovascular: S1 S2 clear, RRR.  Respiratory: CTAB, no wheezing Gastrointestinal: Soft,  nontender, nondistended, NBS Ext: +1 LLE edema Neuro: no new deficits Skin: No rashes Psych: Normal affect    Data Reviewed:  I have personally reviewed following labs    CBC Lab Results  Component Value Date   WBC 4.4 05/05/2022  RBC 3.15 (L) 05/05/2022   HGB 9.4 (L) 05/05/2022   HCT 28.5 (L) 05/05/2022   MCV 90.5 05/05/2022   MCH 29.8 05/05/2022   PLT 184 05/05/2022   MCHC 33.0 05/05/2022   RDW 14.4 05/05/2022   LYMPHSABS 0.9 04/25/2022   MONOABS 0.4 04/25/2022   EOSABS 0.1 04/25/2022   BASOSABS 0.0 48/54/6270     Last metabolic panel Lab Results  Component Value Date   NA 138 05/03/2022   K 4.6 05/03/2022   CL 109 05/03/2022   CO2 24 05/03/2022   BUN 16 05/03/2022   CREATININE 1.11 (H) 05/03/2022   GLUCOSE 162 (H) 05/03/2022   GFRNONAA 50 (L) 05/03/2022   GFRAA 33 (L) 05/03/2019   CALCIUM 8.9 05/03/2022   PHOS 3.3 04/28/2022   PROT 5.8 (L) 04/25/2022   ALBUMIN 3.5 04/25/2022   BILITOT 1.1 04/25/2022   ALKPHOS 117 04/25/2022   AST 30 04/25/2022   ALT 31 04/25/2022   ANIONGAP 5 05/03/2022    CBG (last 3)  Recent Labs    05/04/22 1709 05/04/22 2101 05/05/22 0618  GLUCAP 209* 152* 164*      Coagulation Profile: No results for input(s): "INR", "PROTIME" in the last 168 hours.   Radiology Studies: I have personally reviewed the imaging studies  VAS [place] LOWER EXTREMITY VENOUS (DVT)  Result Date: 05/05/2022  Lower Venous DVT Study Patient Name:  Brandy Gonzales  Date of Exam:   05/04/2022 Medical Rec #: [id number]     Accession #:    [id number] Date of Birth: December 22, 1940     Patient Gender: F Patient Age:   50 years Exam Location:  [facility] Procedure:      VAS [place] LOWER EXTREMITY VENOUS (DVT) Referring Phys: Marquavis Hannen --------------------------------------------------------------------------------  Indications: Swelling. Other Indications: S/P LT ORIF (femur fracture) on 12/17 - immobility. Comparison Study: No previous Performing Technologist:  Jody Hill RVT, RDMS  Examination Guidelines: A complete evaluation includes B-mode imaging, spectral Doppler, color Doppler, and power Doppler as needed of all accessible portions of each vessel. Bilateral testing is considered an integral part of a complete examination. Limited examinations for reoccurring indications may be performed as noted. The reflux portion of the exam is performed with the patient in reverse Trendelenburg.  +---------+---------------+---------+-----------+----------+--------------+ RIGHT    CompressibilityPhasicitySpontaneityPropertiesThrombus Aging +---------+---------------+---------+-----------+----------+--------------+ CFV      Full           Yes      Yes                                 +---------+---------------+---------+-----------+----------+--------------+ SFJ      Full                                                        +---------+---------------+---------+-----------+----------+--------------+ FV Prox  Full           Yes      Yes                                 +---------+---------------+---------+-----------+----------+--------------+ FV Mid   Full           Yes      Yes                                 +---------+---------------+---------+-----------+----------+--------------+  FV DistalFull           Yes      Yes                                 +---------+---------------+---------+-----------+----------+--------------+ PFV      Full                                                        +---------+---------------+---------+-----------+----------+--------------+ POP      Full           Yes      Yes                                 +---------+---------------+---------+-----------+----------+--------------+ PTV      Full                                                        +---------+---------------+---------+-----------+----------+--------------+ PERO     Full                                                         +---------+---------------+---------+-----------+----------+--------------+   +---------+---------------+---------+-----------+----------+--------------+ LEFT     CompressibilityPhasicitySpontaneityPropertiesThrombus Aging +---------+---------------+---------+-----------+----------+--------------+ CFV      None           No       Yes                  Acute          +---------+---------------+---------+-----------+----------+--------------+ SFJ      None                                         Acute          +---------+---------------+---------+-----------+----------+--------------+ FV Prox  Partial        No       No                   Acute          +---------+---------------+---------+-----------+----------+--------------+ FV Mid   None           No       No                   Acute          +---------+---------------+---------+-----------+----------+--------------+ FV DistalNone           No       No                   Acute          +---------+---------------+---------+-----------+----------+--------------+ PFV      None           No       No  Acute          +---------+---------------+---------+-----------+----------+--------------+ POP      None           No       No                   Acute          +---------+---------------+---------+-----------+----------+--------------+ PTV      Full                                                        +---------+---------------+---------+-----------+----------+--------------+ PERO                                                  Not visualized +---------+---------------+---------+-----------+----------+--------------+     Summary: BILATERAL: -No evidence of popliteal cyst, bilaterally. RIGHT: - There is no evidence of deep vein thrombosis in the lower extremity.  LEFT: - Findings consistent with acute deep vein thrombosis involving the left common femoral vein, SF junction, left  femoral vein, left proximal profunda vein, and left popliteal vein.  *See table(s) above for measurements and observations. Electronically signed by Deitra Mayo MD on 05/05/2022 at 4:30:58 AM.    Final    VAS [place] IVC/ILIAC (VENOUS ONLY)  Result Date: 05/05/2022 IVC/ILIAC STUDY Patient Name:  Brandy Gonzales  Date of Exam:   05/04/2022 Medical Rec #: [id number]     Accession #:    [id number] Date of Birth: 1941-04-20     Patient Gender: F Patient Age:   24 years Exam Location:  [facility] Procedure:      VAS [place] IVC/ILIAC (VENOUS ONLY) Referring Phys: Georgeanna Harrison --------------------------------------------------------------------------------  Indications: DVT LLE Limitations: Air/bowel gas and poor tissue/ultrasound interface.  Comparison Study: No previous exams Performing Technologist: Jody Hill RVT, RDMS  Examination Guidelines: A complete evaluation includes B-mode imaging, spectral Doppler, color Doppler, and power Doppler as needed of all accessible portions of each vessel. Bilateral testing is considered an integral part of a complete examination. Limited examinations for reoccurring indications may be performed as noted.  IVC/Iliac Findings: +----------+------+--------+--------+    IVC    PatentThrombusComments +----------+------+--------+--------+ IVC Prox  patent                 +----------+------+--------+--------+ IVC Mid   patent                 +----------+------+--------+--------+ IVC Distalpatent                 +----------+------+--------+--------+  +------------------+---------+-----------+---------+-----------+---------------+        CIV        RT-PatentRT-ThrombusLT-PatentLT-Thrombus   Comments     +------------------+---------+-----------+---------+-----------+---------------+ Common Iliac Prox                                 acute                   +------------------+---------+-----------+---------+-----------+---------------+ Common Iliac  Mid  acute       partial                                                                  thrombus     +------------------+---------+-----------+---------+-----------+---------------+ Common Iliac                                              not visualized  Distal                                                                    +------------------+---------+-----------+---------+-----------+---------------+  +-------------------------+---------+-----------+---------+-----------+--------+            EIV           RT-PatentRT-ThrombusLT-PatentLT-ThrombusComments +-------------------------+---------+-----------+---------+-----------+--------+ External Iliac Vein Prox                                 acute            +-------------------------+---------+-----------+---------+-----------+--------+ External Iliac Vein Mid                                  acute            +-------------------------+---------+-----------+---------+-----------+--------+ External Iliac Vein                                      acute            Distal                                                                    +-------------------------+---------+-----------+---------+-----------+--------+   Summary: IVC/Iliac: There is no evidence of thrombus involving the IVC. There is evidence of acute thrombus involving the left common iliac vein. There is evidence of acute thrombus involving the left external iliac vein. . Distal portion of left common iliac vein not visualized on this exam.  *See table(s) above for measurements and observations.  Electronically signed by Deitra Mayo MD on 05/05/2022 at 4:29:52 AM.    Final        Estill Cotta M.D. [facility] 05/05/2022, 11:31 AM  Available via Epic secure chat 7am-7pm After 7 pm, please refer to night coverage provider listed on amion.

## 2022-05-05 NOTE — Progress Notes (Signed)
ANTICOAGULATION CONSULT NOTE - Follow Up Consult  Pharmacy Consult for heparin Indication: DVT  Labs: Recent Labs    05/03/22 0658 05/05/22 0141  HGB 9.2*  --   HCT 27.7*  --   PLT 148*  --   HEPARINUNFRC  --  1.04*  CREATININE 1.11*  --     Assessment: 81yo female supratherapeutic on heparin with initial dosing for DVT; unfortunately RN could not find bandage to determine where labs were drawn and pt could not say where labs were drawn; no infusion issues or signs of bleeding per RN.  Goal of Therapy:  Heparin level 0.3-0.7 units/ml   Plan:  Will decrease heparin infusion by 2-3 units/kg/hr to 750 units/hr and check level in 8 hours.    Brandy Gonzales, PharmD, BCPS  05/05/2022,3:48 AM

## 2022-05-05 NOTE — Progress Notes (Signed)
Physical Therapy Treatment Patient Details Name: Brandy Gonzales MRN: [id number] DOB: Aug 18, 1940 Today's Date: 05/05/2022   History of Present Illness 81 yo admitted 12/16 after fall with left hip fx s/p IM nail 12/17. PMhx; dementia, GERD, glaucoma, syncope, HTN, DM    PT Comments    Pt received propped in bed sleeping on arrival, however easily woken and agreeable to session. Pt continues to need significant cueing throughout all mobility secondary to impaired cognition and poor recall of sequencing. Pt needing grossly mod assist for OOB mobility with pt able to come to stand x3 and step pivot to recliner however pt with minimal foot clearance bil with pt sliding L foot. Pt with noted edema of LLE/foot, elevated at end of session and ambulation trials deferred this date, RN aware. Pt continues to benefit from skilled PT services to progress toward functional mobility goals.     Recommendations for follow up therapy are one component of a multi-disciplinary discharge planning process, led by the attending physician.  Recommendations may be updated based on patient status, additional functional criteria and insurance authorization.  Follow Up Recommendations  Skilled nursing-short term rehab (<3 hours/day) Can patient physically be transported by private vehicle: No   Assistance Recommended at Discharge Frequent or constant Supervision/Assistance  Patient can return home with the following Two people to help with walking and/or transfers;Assistance with cooking/housework;Direct supervision/assist for medications management;Assist for transportation;Help with stairs or ramp for entrance;A lot of help with bathing/dressing/bathroom   Equipment Recommendations  BSC/3in1    Recommendations for Other Services       Precautions / Restrictions Precautions Precautions: Fall Precaution Comments: Watch BP Restrictions Weight Bearing Restrictions: Yes LLE Weight Bearing: Weight bearing as  tolerated     Mobility  Bed Mobility Overal bed mobility: Needs Assistance Bed Mobility: Supine to Sit     Supine to sit: HOB elevated, Min assist     General bed mobility comments: pt requires assist to maneuver LLE during all bed mobility tasks, assist also needed to elevate trunk into sitting    Transfers Overall transfer level: Needs assistance Equipment used: Rolling walker (2 wheels) Transfers: Sit to/from Stand Sit to Stand: Mod assist   Step pivot transfers: Mod assist       General transfer comment: MOD A to rise x3 from EOB with max cues for hand placment with cues to shift hips anteriorly to come into full stand, mod a to step pivot to recliner, very minimal foot clearnace    Ambulation/Gait               General Gait Details: nt secondary to pain and heparin lvl   Stairs             Wheelchair Mobility    Modified Rankin (Stroke Patients Only)       Balance Overall balance assessment: History of Falls, Needs assistance Sitting-balance support: Feet supported, No upper extremity supported Sitting balance-Leahy Scale: Poor     Standing balance support: Reliant on assistive device for balance, Bilateral upper extremity supported, During functional activity Standing balance-Leahy Scale: Poor                              Cognition Arousal/Alertness: Awake/alert Behavior During Therapy: Flat affect Overall Cognitive Status: History of cognitive impairments - at baseline  General Comments: MAX cues with motor planning and hand placement        Exercises Total Joint Exercises Ankle Circles/Pumps: AROM, Right, Left, 10 reps Long Arc Quad: Left, AAROM, 10 reps    General Comments General comments (skin integrity, edema, etc.): LLE/foot continue to be swollen, elevated at end of session, session limited to trasnfers/bedmobility as heparin level outside therapuetic range       Pertinent Vitals/Pain Pain Assessment Pain Assessment: Faces Faces Pain Scale: Hurts little more Pain Location: left hip Pain Descriptors / Indicators: Aching, Grimacing, Guarding Pain Intervention(s): Monitored during session, Limited activity within patient's tolerance, Repositioned    Home Living                          Prior Function            PT Goals (current goals can now be found in the care plan section) Acute Rehab PT Goals PT Goal Formulation: With patient/family Time For Goal Achievement: 05/11/22    Frequency    Min 4X/week      PT Plan      Co-evaluation              AM-PAC PT "6 Clicks" Mobility   Outcome Measure  Help needed turning from your back to your side while in a flat bed without using bedrails?: A Lot Help needed moving from lying on your back to sitting on the side of a flat bed without using bedrails?: A Lot Help needed moving to and from a bed to a chair (including a wheelchair)?: A Lot Help needed standing up from a chair using your arms (e.g., wheelchair or bedside chair)?: A Lot Help needed to walk in hospital room?: Total Help needed climbing 3-5 steps with a railing? : Total 6 Click Score: 10    End of Session Equipment Utilized During Treatment: Gait belt Activity Tolerance: Patient limited by pain;Patient tolerated treatment well Patient left: with call bell/phone within reach;in chair;with chair alarm set;Other (comment) (with LLE elevated) Nurse Communication: Mobility status PT Visit Diagnosis: Other abnormalities of gait and mobility (R26.89);Muscle weakness (generalized) (M62.81);History of falling (Z91.81)     Time: 5027-7412 PT Time Calculation (min) (ACUTE ONLY): 19 min  Charges:  $Therapeutic Activity: 8-22 mins                     Luxe Cuadros R. PTA Acute Rehabilitation Services Office: [place] 05/05/2022, 10:33 AM
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
## 2022-05-06 ENCOUNTER — Other Ambulatory Visit (HOSPITAL_COMMUNITY): Payer: Self-pay

## 2022-05-06 DIAGNOSIS — Z8709 Personal history of other diseases of the respiratory system: Secondary | ICD-10-CM | POA: Diagnosis not present

## 2022-05-06 DIAGNOSIS — D62 Acute posthemorrhagic anemia: Secondary | ICD-10-CM | POA: Diagnosis not present

## 2022-05-06 DIAGNOSIS — J101 Influenza due to other identified influenza virus with other respiratory manifestations: Secondary | ICD-10-CM | POA: Diagnosis not present

## 2022-05-06 DIAGNOSIS — W19XXXD Unspecified fall, subsequent encounter: Secondary | ICD-10-CM

## 2022-05-06 DIAGNOSIS — G510 Bell's palsy: Secondary | ICD-10-CM | POA: Diagnosis not present

## 2022-05-06 DIAGNOSIS — Z6826 Body mass index (BMI) 26.0-26.9, adult: Secondary | ICD-10-CM | POA: Diagnosis not present

## 2022-05-06 DIAGNOSIS — R609 Edema, unspecified: Secondary | ICD-10-CM | POA: Diagnosis not present

## 2022-05-06 DIAGNOSIS — N170 Acute kidney failure with tubular necrosis: Secondary | ICD-10-CM | POA: Diagnosis not present

## 2022-05-06 DIAGNOSIS — Z79899 Other long term (current) drug therapy: Secondary | ICD-10-CM | POA: Diagnosis not present

## 2022-05-06 DIAGNOSIS — I129 Hypertensive chronic kidney disease with stage 1 through stage 4 chronic kidney disease, or unspecified chronic kidney disease: Secondary | ICD-10-CM | POA: Diagnosis present

## 2022-05-06 DIAGNOSIS — E1140 Type 2 diabetes mellitus with diabetic neuropathy, unspecified: Secondary | ICD-10-CM | POA: Diagnosis not present

## 2022-05-06 DIAGNOSIS — Z8679 Personal history of other diseases of the circulatory system: Secondary | ICD-10-CM | POA: Diagnosis not present

## 2022-05-06 DIAGNOSIS — I8290 Acute embolism and thrombosis of unspecified vein: Secondary | ICD-10-CM | POA: Diagnosis present

## 2022-05-06 DIAGNOSIS — Z7901 Long term (current) use of anticoagulants: Secondary | ICD-10-CM | POA: Diagnosis not present

## 2022-05-06 DIAGNOSIS — I82422 Acute embolism and thrombosis of left iliac vein: Secondary | ICD-10-CM | POA: Diagnosis present

## 2022-05-06 DIAGNOSIS — D518 Other vitamin B12 deficiency anemias: Secondary | ICD-10-CM | POA: Diagnosis not present

## 2022-05-06 DIAGNOSIS — E1122 Type 2 diabetes mellitus with diabetic chronic kidney disease: Secondary | ICD-10-CM | POA: Diagnosis not present

## 2022-05-06 DIAGNOSIS — I110 Hypertensive heart disease with heart failure: Secondary | ICD-10-CM | POA: Diagnosis not present

## 2022-05-06 DIAGNOSIS — K219 Gastro-esophageal reflux disease without esophagitis: Secondary | ICD-10-CM | POA: Diagnosis present

## 2022-05-06 DIAGNOSIS — R2242 Localized swelling, mass and lump, left lower limb: Secondary | ICD-10-CM | POA: Diagnosis not present

## 2022-05-06 DIAGNOSIS — R059 Cough, unspecified: Secondary | ICD-10-CM | POA: Diagnosis not present

## 2022-05-06 DIAGNOSIS — Z8249 Family history of ischemic heart disease and other diseases of the circulatory system: Secondary | ICD-10-CM | POA: Diagnosis not present

## 2022-05-06 DIAGNOSIS — E538 Deficiency of other specified B group vitamins: Secondary | ICD-10-CM | POA: Diagnosis not present

## 2022-05-06 DIAGNOSIS — Z86718 Personal history of other venous thrombosis and embolism: Secondary | ICD-10-CM | POA: Diagnosis not present

## 2022-05-06 DIAGNOSIS — Z888 Allergy status to other drugs, medicaments and biological substances status: Secondary | ICD-10-CM | POA: Diagnosis not present

## 2022-05-06 DIAGNOSIS — M7989 Other specified soft tissue disorders: Secondary | ICD-10-CM | POA: Diagnosis not present

## 2022-05-06 DIAGNOSIS — M6281 Muscle weakness (generalized): Secondary | ICD-10-CM | POA: Diagnosis not present

## 2022-05-06 DIAGNOSIS — Z95828 Presence of other vascular implants and grafts: Secondary | ICD-10-CM | POA: Diagnosis not present

## 2022-05-06 DIAGNOSIS — I82412 Acute embolism and thrombosis of left femoral vein: Secondary | ICD-10-CM | POA: Diagnosis not present

## 2022-05-06 DIAGNOSIS — E1165 Type 2 diabetes mellitus with hyperglycemia: Secondary | ICD-10-CM | POA: Diagnosis present

## 2022-05-06 DIAGNOSIS — S72002A Fracture of unspecified part of neck of left femur, initial encounter for closed fracture: Secondary | ICD-10-CM | POA: Diagnosis not present

## 2022-05-06 DIAGNOSIS — S72142D Displaced intertrochanteric fracture of left femur, subsequent encounter for closed fracture with routine healing: Secondary | ICD-10-CM | POA: Diagnosis not present

## 2022-05-06 DIAGNOSIS — L03116 Cellulitis of left lower limb: Secondary | ICD-10-CM | POA: Diagnosis not present

## 2022-05-06 DIAGNOSIS — Z833 Family history of diabetes mellitus: Secondary | ICD-10-CM | POA: Diagnosis not present

## 2022-05-06 DIAGNOSIS — I871 Compression of vein: Secondary | ICD-10-CM | POA: Diagnosis present

## 2022-05-06 DIAGNOSIS — R410 Disorientation, unspecified: Secondary | ICD-10-CM | POA: Diagnosis not present

## 2022-05-06 DIAGNOSIS — E43 Unspecified severe protein-calorie malnutrition: Secondary | ICD-10-CM | POA: Diagnosis not present

## 2022-05-06 DIAGNOSIS — S72142A Displaced intertrochanteric fracture of left femur, initial encounter for closed fracture: Secondary | ICD-10-CM | POA: Diagnosis not present

## 2022-05-06 DIAGNOSIS — Z7984 Long term (current) use of oral hypoglycemic drugs: Secondary | ICD-10-CM | POA: Diagnosis not present

## 2022-05-06 DIAGNOSIS — R4182 Altered mental status, unspecified: Secondary | ICD-10-CM | POA: Diagnosis not present

## 2022-05-06 DIAGNOSIS — Z90710 Acquired absence of both cervix and uterus: Secondary | ICD-10-CM | POA: Diagnosis not present

## 2022-05-06 DIAGNOSIS — I10 Essential (primary) hypertension: Secondary | ICD-10-CM | POA: Diagnosis not present

## 2022-05-06 DIAGNOSIS — R600 Localized edema: Secondary | ICD-10-CM | POA: Diagnosis not present

## 2022-05-06 DIAGNOSIS — Z885 Allergy status to narcotic agent status: Secondary | ICD-10-CM | POA: Diagnosis not present

## 2022-05-06 DIAGNOSIS — E559 Vitamin D deficiency, unspecified: Secondary | ICD-10-CM | POA: Diagnosis not present

## 2022-05-06 DIAGNOSIS — N1832 Chronic kidney disease, stage 3b: Secondary | ICD-10-CM | POA: Diagnosis not present

## 2022-05-06 DIAGNOSIS — S79192S Other physeal fracture of lower end of left femur, sequela: Secondary | ICD-10-CM | POA: Diagnosis not present

## 2022-05-06 DIAGNOSIS — R55 Syncope and collapse: Secondary | ICD-10-CM | POA: Diagnosis not present

## 2022-05-06 DIAGNOSIS — I82402 Acute embolism and thrombosis of unspecified deep veins of left lower extremity: Secondary | ICD-10-CM | POA: Diagnosis not present

## 2022-05-06 DIAGNOSIS — J09X1 Influenza due to identified novel influenza A virus with pneumonia: Secondary | ICD-10-CM | POA: Diagnosis not present

## 2022-05-06 DIAGNOSIS — Z841 Family history of disorders of kidney and ureter: Secondary | ICD-10-CM | POA: Diagnosis not present

## 2022-05-06 DIAGNOSIS — Z7401 Bed confinement status: Secondary | ICD-10-CM | POA: Diagnosis not present

## 2022-05-06 HISTORY — DX: Unspecified fall, subsequent encounter: W19.XXXD

## 2022-05-06 LAB — GLUCOSE, CAPILLARY
Glucose-Capillary: 134 mg/dL — ABNORMAL HIGH (ref 70–99)
Glucose-Capillary: 173 mg/dL — ABNORMAL HIGH (ref 70–99)
Glucose-Capillary: 326 mg/dL — ABNORMAL HIGH (ref 70–99)

## 2022-05-06 LAB — CBC
HCT: 26.5 % — ABNORMAL LOW (ref 36.0–46.0)
Hemoglobin: 8.9 g/dL — ABNORMAL LOW (ref 12.0–15.0)
MCH: 30.1 pg (ref 26.0–34.0)
MCHC: 33.6 g/dL (ref 30.0–36.0)
MCV: 89.5 fL (ref 80.0–100.0)
Platelets: 187 10*3/uL (ref 150–400)
RBC: 2.96 MIL/uL — ABNORMAL LOW (ref 3.87–5.11)
RDW: 14.4 % (ref 11.5–15.5)
WBC: 4.5 10*3/uL (ref 4.0–10.5)
nRBC: 0 % (ref 0.0–0.2)

## 2022-05-06 MED ORDER — APIXABAN 5 MG PO TABS: ORAL_TABLET | ORAL | 0 refills | Status: DC

## 2022-05-06 MED ORDER — ENSURE ENLIVE PO LIQD
237.0000 mL | Freq: Two times a day (BID) | ORAL | Status: DC
  Administered 2022-05-06: 237 mL via ORAL

## 2022-05-06 MED ORDER — APIXABAN 5 MG PO TABS: 5.0000 mg | ORAL_TABLET | Freq: Two times a day (BID) | ORAL | Status: DC

## 2022-05-06 MED ORDER — APIXABAN 5 MG PO TABS: ORAL_TABLET | ORAL | 2 refills | Status: DC

## 2022-05-06 MED ORDER — APIXABAN 5 MG PO TABS
10.0000 mg | ORAL_TABLET | Freq: Two times a day (BID) | ORAL | Status: DC
  Administered 2022-05-06: 10 mg via ORAL
  Filled 2022-05-06: qty 2

## 2022-05-06 MED ORDER — ADULT MULTIVITAMIN W/MINERALS CH
1.0000 | ORAL_TABLET | Freq: Every day | ORAL | Status: DC
  Administered 2022-05-06: 1 via ORAL
  Filled 2022-05-06: qty 1

## 2022-05-06 NOTE — Progress Notes (Signed)
Report called and given to RN at [facility]. LCSW and bedside RN aware.

## 2022-05-06 NOTE — Progress Notes (Signed)
Nutrition Follow-up  DOCUMENTATION CODES:   Severe malnutrition in context of chronic illness  INTERVENTION:  Continue current diet as ordered Adjust to ordering assist Nursing to assist with tray setup Ensure Enlive po BID, each supplement provides 350 kcal and 20 grams of protein. MVI with minerals daily  NUTRITION DIAGNOSIS:   Severe Malnutrition related to chronic illness as evidenced by energy intake < or equal to 75% for > or equal to 1 month, percent weight loss (12% wt loss in 3 months). - remains applicable  GOAL:   Patient will meet greater than or equal to 90% of their needs - progressing  MONITOR:   PO intake, Diet advancement  REASON FOR ASSESSMENT:   Malnutrition Screening Tool    ASSESSMENT:   81 y.o. female admits related to fall and left hip pain. PMH includes: HTN, T2DM. CKD stage 3. Pt is currently receiving medical management for closed left hip fracture.  12/17 - Op, s/p IM NAIL INTERTROCHANTERIC   Pt resting in bed at the time of assessment eating breakfast. Initially stated she was not going to eat anything but then once tray was set up for her, did start eating applesauce and stated she was going to eat her cream of wheat and sausage. Pt having trouble opening packages and is not using her left arm. Would benefit from assistance with tray set-up and cutting foods.   Mild muscle deficits noted on exam. Will add ensure, pt is agreeable to receiving. Per MD, pt is stable for dc to SNF when bed is available.   Average Meal Intake: 12/17-12/26: 55% intake x 7 recorded meals  Nutritionally Relevant Medications: Scheduled Meds:  vitamin B-12  1,000 mcg Oral Daily   docusate sodium  100 mg Oral BID   folic acid  1 mg Oral Daily   insulin aspart  0-5 Units Subcutaneous QHS   insulin aspart  0-9 Units Subcutaneous TID WC   thiamine  100 mg Oral Daily   Continuous Infusions:  sodium chloride 100 mL/hr at 04/28/22 1947   PRN Meds: metoCLOPramide,  ondansetron, senna-docusate  Labs Reviewed  NUTRITION - FOCUSED PHYSICAL EXAM: Flowsheet Row Most Recent Value  Orbital Region Mild depletion  Upper Arm Region Mild depletion  Thoracic and Lumbar Region Mild depletion  Buccal Region No depletion  Temple Region Moderate depletion  Clavicle Bone Region Mild depletion  Clavicle and Acromion Bone Region Mild depletion  Scapular Bone Region No depletion  Dorsal Hand Mild depletion  Patellar Region Mild depletion  Anterior Thigh Region Mild depletion  Posterior Calf Region Mild depletion  Edema (RD Assessment) None  Hair Reviewed  Eyes Reviewed  Mouth Reviewed  Skin Reviewed  Nails Reviewed    Diet Order:   Diet Order             Diet - low sodium heart healthy           Diet regular Room service appropriate? Yes; Fluid consistency: Thin  Diet effective now                   EDUCATION NEEDS:  Not appropriate for education at this time  Skin:  Skin Assessment: Skin Integrity Issues: Skin Integrity Issues:: Incisions Incisions: left hip  Last BM:  12/24 - type 5  Height:  Ht Readings from Last 1 Encounters:  04/30/22 4\' 11"  (1.499 m)    Weight:  Wt Readings from Last 1 Encounters:  05/04/22 60.2 kg    Ideal Body Weight:  44.3 kg  BMI:  Body mass index is 26.81 kg/m.  Estimated Nutritional Needs:  Kcal:  1400-1600 kcal/d Protein:  65-80g/d Fluid:  >/= 1.5 L    Ranell Patrick, RD, LDN Clinical Dietitian RD pager # available in [place]  After hours/weekend pager # available in [facility]

## 2022-05-06 NOTE — TOC Transition Note (Signed)
Transition of Care [facility]) - CM/SW Discharge Note   Patient Details  Name: Brandy Gonzales MRN: [id number] Date of Birth: [birth year]  [facility] CM/SW Contact:  Jinger Neighbors, LCSW Phone Number: 05/06/2022, 10:13 AM   Clinical Narrative:     PT going to [facility] via [place].  Call to Report: [phone] Room: 511  Final next level of care: Skilled Nursing Facility Barriers to Discharge: No Barriers Identified   Patient Goals and CMS Choice CMS Medicare.gov Compare Post Acute Care list provided to:: Patient Represenative (must comment) Denyse Dago) Choice offered to / list presented to : Adult Children  Discharge Placement                Patient [facility] bed at: [facility] Patient to be transferred to facility by: [place] Name of family member notified: Denyse Dago Patient and family notified of of transfer: 05/01/22  Discharge Plan and Services Additional resources added to the After Visit Summary for   In-house Referral: Clinical Social Work Discharge Planning Services: CM Consult Post Acute Care Choice: [place]                               Social Determinants of Health (SDOH) Interventions SDOH Screenings   Food Insecurity: No Food Insecurity (04/25/2022)  Housing: Low Risk  (04/25/2022)  Transportation Needs: No Transportation Needs (04/25/2022)  Utilities: Not At Risk (04/25/2022)  Alcohol Screen: Low Risk  (03/24/2021)  Depression (PHQ2-9): Medium Risk (01/21/2022)  Financial Resource Strain: Low Risk  (03/24/2021)  Physical Activity: Inactive (03/24/2021)  Social Connections: Moderately Isolated (03/24/2021)  Stress: No Stress Concern Present (03/24/2021)  Tobacco Use: Low Risk  (04/29/2022)     Readmission Risk Interventions     No data to display

## 2022-05-06 NOTE — TOC Progression Note (Signed)
Transition of Care [facility]) - Progression Note    Patient Details  Name: Brandy Gonzales MRN: [id number] Date of Birth: 17-Aug-1940  Transition of Care [facility]) CM/SW Contact  Jinger Neighbors, [place] Phone Number: 05/06/2022, 8:40 AM  Clinical Narrative:     CSW messaged Magda Paganini at [facility] to make her aware pt's last dose of antibiotics was yesterday and should be good to go today.    Expected Discharge Plan: [place] Barriers to Discharge: No Barriers Identified  Expected Discharge Plan and Services In-house Referral: Clinical Social Work Discharge Planning Services: CM Consult Post Acute Care Choice: [place] Living arrangements for the past 2 months: Single Family Home Expected Discharge Date: 05/01/22                                     Social Determinants of Health (SDOH) Interventions SDOH Screenings   Food Insecurity: No Food Insecurity (04/25/2022)  Housing: Low Risk  (04/25/2022)  Transportation Needs: No Transportation Needs (04/25/2022)  Utilities: Not At Risk (04/25/2022)  Alcohol Screen: Low Risk  (03/24/2021)  Depression (PHQ2-9): Medium Risk (01/21/2022)  Financial Resource Strain: Low Risk  (03/24/2021)  Physical Activity: Inactive (03/24/2021)  Social Connections: Moderately Isolated (03/24/2021)  Stress: No Stress Concern Present (03/24/2021)  Tobacco Use: Low Risk  (04/29/2022)    Readmission Risk Interventions     No data to display

## 2022-05-06 NOTE — TOC Benefit Eligibility Note (Signed)
Patient Advocate Encounter  Insurance verification completed.    The patient is currently admitted and upon discharge could be taking Eliquis 5 mg.  The current 30 day co-pay is $10.35.   The patient is insured through [employer] Medicare Part D   Dasean Brow Fraiser, CPHT Pharmacy Patient Advocate Specialist [facility] Patient Advocate Team Direct Number: [phone]  Fax: [phone]       

## 2022-05-06 NOTE — Discharge Summary (Signed)
Physician Discharge Summary   Patient: Brandy Gonzales MRN: [id number] DOB: June 19, 1940  Admit date:     04/25/2022  Discharge date: 05/06/22  Discharge Physician: Estill Cotta, MD    PCP: Cassandria Anger, MD   Recommendations at discharge:   Started eliquis 10 mg twice daily for acute DVT, continue till 05/12/2022. On 05/13/2022, start Eliquis 5 mg twice daily for at least 3 months for acute provoked DVT postoperative.  Discharge Diagnoses:  Closed left hip fracture status post ORIF on 1217 Syncope Acute DVT (deep venous thrombosis) (HCC) left lower extremity  Influenza A with pneumonia   Controlled type 2 diabetes mellitus with diabetic nephropathy (HCC)   Essential hypertension AKI on stage 3b chronic kidney disease (CKD) (HCC)   Protein-calorie malnutrition, severe     Hospital Course:  Brandy Gonzales is a 81 y.o. female with past medical history significant for HTN, DM2, CKD stage IIIb who presented to [facility] ED on 12/16 with complaints of left hip pain following fall at home.  Patient reports she was unable to bear weight on her left leg due to severe hip pain.  Workup in the ED notable for comminuted left femoral intertrochanteric fracture.  Orthopedics was consulted and patient underwent ORIF by Dr. Mable Fill on 12/17.  On postoperative day #2, while working with physical therapy patient had a brief syncopal episode; code stroke was initially activated in which patient underwent EEG and MRI testing which ruled out acute stroke and neurology has now signed off.   Assessment and Plan:  Left femoral intertrochanteric fracture Patient presented to ED following mechanical fall at home with inability to bear weight secondary to severe hip pain.  -Imaging showed comminuted left femoral intra-articular fracture.  - Orthopedics was consulted and underwent ORIF on 12/17.  - WBAT left lower extremity. Norco as needed moderate/severe pain.   -Outpatient follow-up with Dr. Mable Fill, orthopedics  2 weeks. -Patient developed acute extensive left lower extremity DVT and currently on Eliquis, minimum of 3 months for provoked DVT.    Influenza A positive -Patient spiked low-grade fevers 100 F overnight on 12/22, COVID-19 negative, positive for influenza A. RSV negative -Started on Tamiflu, 30 mg daily for 5 days, completed on/26/23   Acute extensive left LE DVT -Venous Dopplers showed acute DVT involving the left common femoral, SF junction, left femoral vein, left proximal profunda vein and left popliteal vein.  No DVT in RLE -Patient was placed on IV heparin drip.   Discussed with interventional radiology, recommended to continue anticoagulation, no role for mechanical thrombectomy unless severe phlegmasia -Transition to oral eliquis, 10 mg twice daily till 05/12/22.  Then start Eliquis 5 mg twice daily on 05/13/2022 for 3 months for provoked DVT.     Syncope -On postoperative day #2, patient had syncopal episode while working with physical therapy.  Code stroke was initially activated with CT head, MRI and EEG all unremarkable. - No arrhythmias were reported on telemetry.  -No further events since no further events since.   B12 and folate deficiency Folate 5.9, vitamin B12 720. -Continue folic acid 1 mg daily, vitamin B12 1000 mcg daily    Essential hypertension Continue Coreg 25 mg twice daily   Type 2 diabetes mellitus Hemoglobin A1c 7.17 April 2022, not optimally controlled.  Home regimen includes Amaryl 2 mg p.o. daily, Farxiga 10 mg p.o. daily. -Patient was placed on sliding scale insulin while inpatient     AKI on CKD stage IIIb - baseline creatinine 1.2-1.4. Cr at  baseline now      Protein-calorie malnutrition, severe - Etiology: chronic illness Estimated body mass index is 26.81 kg/m as calculated from the following:   Height as of this encounter: 4\' 11"  (1.499 m).   Weight as of this encounter: 60.2 kg.        Pain control - [employer] Controlled  Substance Reporting System database was reviewed. and patient was instructed, not to drive, operate heavy machinery, perform activities at heights, swimming or participation in water activities or provide baby-sitting services while on Pain, Sleep and Anxiety Medications; until their outpatient Physician has advised to do so again. Also recommended to not to take more than prescribed Pain, Sleep and Anxiety Medications.  Consultants: Orthopedics Procedures performed: ORIF Disposition: Skilled nursing facility Diet recommendation:  Discharge Diet Orders (From admission, onward)     Start     Ordered   04/30/22 0000  Diet - low sodium heart healthy        04/30/22 1325           Carb modified diet DISCHARGE MEDICATION: Allergies as of 05/06/2022       Reactions   Amlodipine Besylate    REACTION: hair loss   Atenolol    REACTION: fluid retention   Enalapril Maleate    REACTION: palpitations   Oxycodone Hcl    REACTION: dizzy   Propoxyphene N-acetaminophen Other (See Comments)   Unknown reaction        Medication List     STOP taking these medications    aspirin EC 81 MG tablet       TAKE these medications    acetaminophen 500 MG tablet Commonly known as: TYLENOL Take 500 mg by mouth every 6 (six) hours as needed for moderate pain.   apixaban 5 MG Tabs tablet Commonly known as: ELIQUIS Take 2 tabs (10mg ) PO twice a day for 1 week. On 05/13/22, start 1 tab (5mg ) PO twice a day for 3 months. Start taking on: May 13, 2022   carvedilol 25 MG tablet Commonly known as: COREG Take 1 tablet (25 mg total) by mouth 2 (two) times daily with a meal.   cyanocobalamin 1000 MCG tablet Take 1 tablet (1,000 mcg total) by mouth daily.   dapagliflozin propanediol 10 MG Tabs tablet Commonly known as: Farxiga Take 1 tablet (10 mg total) by mouth daily before breakfast.   folic acid 1 MG tablet Commonly known as: FOLVITE Take 1 tablet (1 mg total) by mouth daily.    glimepiride 2 MG tablet Commonly known as: AMARYL Take 1 tablet (2 mg total) by mouth daily before breakfast.   HYDROcodone-acetaminophen 5-325 MG tablet Commonly known as: NORCO/VICODIN Take 1 tablet by mouth every 6 (six) hours as needed for severe pain (POSTOPERATIVE PAIN).   mupirocin ointment 2 % Commonly known as: BACTROBAN On leg wound w/dressing change qd or bid What changed:  how much to take how to take this when to take this reasons to take this additional instructions   omeprazole 20 MG capsule Commonly known as: PRILOSEC Take 1 capsule (20 mg total) by mouth daily.   thiamine 100 MG tablet Commonly known as: Vitamin B-1 Take 1 tablet (100 mg total) by mouth daily.   torsemide 20 MG tablet Commonly known as: DEMADEX Take 1-2 tablets (20-40 mg total) by mouth daily as needed. What changed: reasons to take this   Vitamin D3 50 MCG (2000 UT) capsule Take 1 capsule (2,000 Units total) by mouth daily.  Discharge Care Instructions  (From admission, onward)           Start     Ordered   05/06/22 0000  If the dressing is still on your incision site when you go home, remove it on the third day after your surgery date. Remove dressing if it begins to fall off, or if it is dirty or damaged before the third day.        05/06/22 0936            Contact information for follow-up providers     Georgeanna Harrison, MD Follow up in 2 week(s).   Specialty: Orthopedic Surgery Contact information: [place] [place] [address] [phone]         [name], Evie Lacks, MD. Schedule an appointment as soon as possible for a visit in 2 week(s).   Specialty: Internal Medicine Why: for hospital follow-up Contact information: [place] [address] [phone]              Contact information for after-discharge care     [facility] .   Service: Skilled Nursing Contact information: [place] [phone]                    Discharge Exam: Filed Weights   04/29/22 0600 05/03/22 0600 05/04/22 0600  Weight: 61.3 kg 60 kg 60.2 kg   S: No acute complaints.  Looking forward to discharge to SNF.  No nausea vomiting, pain, fevers or chills.  BP 127/76 (BP Location: Right Arm)   Pulse 65   Temp 98.4 F (36.9 C) (Oral)   Resp 16   Ht 4\' 11"  (1.499 m)   Wt 60.2 kg   SpO2 100%   BMI 26.81 kg/m    Physical Exam General: Alert and oriented x 3, NAD Cardiovascular: S1 S2 clear, RRR.  Respiratory: CTAB, no wheezing, rales or rhonchi Gastrointestinal: Soft, nontender, nondistended, NBS Ext: LLE edema + Psych: Normal affect    Condition at discharge: fair  The results of significant diagnostics from this hospitalization (including imaging, microbiology, ancillary and laboratory) are listed below for reference.   Imaging Studies: VAS [place] LOWER EXTREMITY VENOUS (DVT)  Result Date: 05/05/2022  Lower Venous DVT Study Patient Name:  KALESE ENSZ  Date of Exam:   05/04/2022 Medical Rec #: [id number]     Accession #:    [id number] Date of Birth: 06-04-1940     Patient Gender: F Patient Age:   58 years Exam Location:  [facility] Procedure:      VAS [place] LOWER EXTREMITY VENOUS (DVT) Referring Phys: Arielle Eber --------------------------------------------------------------------------------  Indications: Swelling. Other Indications: S/P LT ORIF (femur fracture) on 12/17 - immobility. Comparison Study: No previous Performing Technologist: Jody Hill RVT, RDMS  Examination Guidelines: A complete evaluation includes B-mode imaging, spectral Doppler, color Doppler, and power Doppler as needed of all accessible portions of each vessel. Bilateral testing is considered an integral part of a complete examination. Limited examinations for  reoccurring indications may be performed as noted. The reflux portion of the exam is performed with the patient in reverse Trendelenburg.  +---------+---------------+---------+-----------+----------+--------------+ RIGHT    CompressibilityPhasicitySpontaneityPropertiesThrombus Aging +---------+---------------+---------+-----------+----------+--------------+ CFV      Full           Yes      Yes                                 +---------+---------------+---------+-----------+----------+--------------+  SFJ      Full                                                        +---------+---------------+---------+-----------+----------+--------------+ FV Prox  Full           Yes      Yes                                 +---------+---------------+---------+-----------+----------+--------------+ FV Mid   Full           Yes      Yes                                 +---------+---------------+---------+-----------+----------+--------------+ FV DistalFull           Yes      Yes                                 +---------+---------------+---------+-----------+----------+--------------+ PFV      Full                                                        +---------+---------------+---------+-----------+----------+--------------+ POP      Full           Yes      Yes                                 +---------+---------------+---------+-----------+----------+--------------+ PTV      Full                                                        +---------+---------------+---------+-----------+----------+--------------+ PERO     Full                                                        +---------+---------------+---------+-----------+----------+--------------+   +---------+---------------+---------+-----------+----------+--------------+ LEFT     CompressibilityPhasicitySpontaneityPropertiesThrombus Aging  +---------+---------------+---------+-----------+----------+--------------+ CFV      None           No       Yes                  Acute          +---------+---------------+---------+-----------+----------+--------------+ SFJ      None                                         Acute          +---------+---------------+---------+-----------+----------+--------------+ FV Prox  Partial  No       No                   Acute          +---------+---------------+---------+-----------+----------+--------------+ FV Mid   None           No       No                   Acute          +---------+---------------+---------+-----------+----------+--------------+ FV DistalNone           No       No                   Acute          +---------+---------------+---------+-----------+----------+--------------+ PFV      None           No       No                   Acute          +---------+---------------+---------+-----------+----------+--------------+ POP      None           No       No                   Acute          +---------+---------------+---------+-----------+----------+--------------+ PTV      Full                                                        +---------+---------------+---------+-----------+----------+--------------+ PERO                                                  Not visualized +---------+---------------+---------+-----------+----------+--------------+     Summary: BILATERAL: -No evidence of popliteal cyst, bilaterally. RIGHT: - There is no evidence of deep vein thrombosis in the lower extremity.  LEFT: - Findings consistent with acute deep vein thrombosis involving the left common femoral vein, SF junction, left femoral vein, left proximal profunda vein, and left popliteal vein.  *See table(s) above for measurements and observations. Electronically signed by Deitra Mayo MD on 05/05/2022 at 4:30:58 AM.    Final    VAS [place] IVC/ILIAC (VENOUS  ONLY)  Result Date: 05/05/2022 IVC/ILIAC STUDY Patient Name:  EVOLET SALMINEN  Date of Exam:   05/04/2022 Medical Rec #: [id number]     Accession #:    [id number] Date of Birth: Sep 13, 1940     Patient Gender: F Patient Age:   79 years Exam Location:  [facility] Procedure:      VAS [place] IVC/ILIAC (VENOUS ONLY) Referring Phys: Georgeanna Harrison --------------------------------------------------------------------------------  Indications: DVT LLE Limitations: Air/bowel gas and poor tissue/ultrasound interface.  Comparison Study: No previous exams Performing Technologist: Jody Hill RVT, RDMS  Examination Guidelines: A complete evaluation includes B-mode imaging, spectral Doppler, color Doppler, and power Doppler as needed of all accessible portions of each vessel. Bilateral testing is considered an integral part of a complete examination. Limited examinations for reoccurring indications may be performed as noted.  IVC/Iliac Findings: +----------+------+--------+--------+    IVC  PatentThrombusComments +----------+------+--------+--------+ IVC Prox  patent                 +----------+------+--------+--------+ IVC Mid   patent                 +----------+------+--------+--------+ IVC Distalpatent                 +----------+------+--------+--------+  +------------------+---------+-----------+---------+-----------+---------------+        CIV        RT-PatentRT-ThrombusLT-PatentLT-Thrombus   Comments     +------------------+---------+-----------+---------+-----------+---------------+ Common Iliac Prox                                 acute                   +------------------+---------+-----------+---------+-----------+---------------+ Common Iliac Mid                                  acute       partial                                                                  thrombus     +------------------+---------+-----------+---------+-----------+---------------+ Common Iliac                                               not visualized  Distal                                                                    +------------------+---------+-----------+---------+-----------+---------------+  +-------------------------+---------+-----------+---------+-----------+--------+            EIV           RT-PatentRT-ThrombusLT-PatentLT-ThrombusComments +-------------------------+---------+-----------+---------+-----------+--------+ External Iliac Vein Prox                                 acute            +-------------------------+---------+-----------+---------+-----------+--------+ External Iliac Vein Mid                                  acute            +-------------------------+---------+-----------+---------+-----------+--------+ External Iliac Vein                                      acute            Distal                                                                    +-------------------------+---------+-----------+---------+-----------+--------+  Summary: IVC/Iliac: There is no evidence of thrombus involving the IVC. There is evidence of acute thrombus involving the left common iliac vein. There is evidence of acute thrombus involving the left external iliac vein. . Distal portion of left common iliac vein not visualized on this exam.  *See table(s) above for measurements and observations.  Electronically signed by Deitra Mayo MD on 05/05/2022 at 4:29:52 AM.    Final    MR BRAIN WO CONTRAST  Result Date: 04/27/2022 CLINICAL DATA:  Altered mental status EXAM: MRI HEAD WITHOUT CONTRAST TECHNIQUE: Multiplanar, multiecho pulse sequences of the brain and surrounding structures were obtained without intravenous contrast. COMPARISON:  Same-day CT/CTA head and neck, brain MRI 10/17/2021 FINDINGS: Image quality is intermittently motion degraded. Specifically, the axial FLAIR sequence is moderately motion degraded. Brain: There is no  evidence of acute intracranial hemorrhage, extra-axial fluid collection, or acute territorial infarct. Tiny foci of SWI signal abnormality in the high right frontoparietal region are favored artifactual. There is unchanged parenchymal volume loss with prominence of the ventricular system and extra-axial CSF spaces. There is extensive patchy and confluence FLAIR signal abnormality throughout the supratentorial brain consistent with advanced chronic small-vessel ischemic change. Encephalomalacia in the left frontal lobe consistent with prior infarct is unchanged. Small remote infarcts in the bilateral cerebellar hemispheres are unchanged. Old hemorrhage in the bilateral parietal lobes is unchanged likely associated with prior infarcts There is no mass lesion.  There is no mass effect or midline shift. Vascular: Normal flow voids. Skull and upper cervical spine: Normal marrow signal. Sinuses/Orbits: The paranasal sinuses are clear. Bilateral lens implants are in place. The globes and orbits are otherwise unremarkable. Other: None. IMPRESSION: 1. No definite evidence of acute intracranial pathology, within the confines of motion degraded images. 2. Stable chronic findings as above. Electronically Signed   By: Valetta Mole M.D.   On: 04/27/2022 21:37   EEG adult  Result Date: 04/27/2022 Lora Havens, MD     04/27/2022  2:48 PM Patient Name: APRIL COLTER MRN: [id number] Epilepsy Attending: Lora Havens Referring Physician/Provider: Donnetta Simpers, MD Date: 04/27/2022 Duration: 22.03 mins Patient history: 81 year old female with presyncopal episode this morning now with aphasia.  EEG to evaluate for seizure. Level of alertness: Awake AEDs during EEG study: None Technical aspects: This EEG study was done with scalp electrodes positioned according to the 10-20 International system of electrode placement. Electrical activity was reviewed with band pass filter of 1-70Hz , sensitivity of 7 uV/mm, display speed  of 17mm/sec with a 60Hz  notched filter applied as appropriate. EEG data were recorded continuously and digitally stored.  Video monitoring was available and reviewed as appropriate. Description: No clear posterior dominant rhythm was seen.  EEG showed continuous generalized predominantly 5 to 7 Hz theta slowing admixed with intermittent generalized 2 to 3 Hz delta slowing. Hyperventilation and photic stimulation were not performed.   ABNORMALITY - Continuous slow, generalized IMPRESSION: This study is suggestive of moderate diffuse encephalopathy, nonspecific etiology. No seizures or epileptiform discharges were seen throughout the recording. Priyanka Barbra Sarks   CT ANGIO HEAD NECK W WO CM (CODE STROKE)  Result Date: 04/27/2022 CLINICAL DATA:  Aphasia out of proportion to Encephalopathy. EXAM: CT ANGIOGRAPHY HEAD AND NECK TECHNIQUE: Multidetector CT imaging of the head and neck was performed using the standard protocol during bolus administration of intravenous contrast. Multiplanar CT image reconstructions and MIPs were obtained to evaluate the vascular anatomy. Carotid stenosis measurements (when applicable) are obtained utilizing NASCET criteria, using the  distal internal carotid diameter as the denominator. RADIATION DOSE REDUCTION: This exam was performed according to the departmental dose-optimization program which includes automated exposure control, adjustment of the mA and/or kV according to patient size and/or use of iterative reconstruction technique. CONTRAST:  69mL OMNIPAQUE IOHEXOL 350 MG/ML SOLN COMPARISON:  Head CT earlier same day.  MRI 11/16/2021 FINDINGS: CTA NECK FINDINGS Aortic arch: Aortic atherosclerosis. Branching pattern is normal without origin stenosis. Right carotid system: Common carotid artery widely patent to the bifurcation. Calcified plaque at the carotid bifurcation and ICA bulb but no stenosis. Cervical ICA widely patent. Left carotid system: Common carotid artery widely patent  to the bifurcation. No plaque at the bifurcation or ICA bulb. No ICA stenosis. Vertebral arteries: Both vertebral arteries widely patent at their origins and through the cervical region to the foramen magnum. Skeleton: Chronic cervical spondylosis. Other neck: No mass or lymphadenopathy. Upper chest: Normal Review of the MIP images confirms the above findings CTA HEAD FINDINGS Anterior circulation: Both internal carotid arteries are patent through the skull base and siphon regions. There is ordinary siphon atherosclerotic calcification but no stenosis greater than 30-50%. The anterior and middle cerebral vessels are patent. No large vessel occlusion or proximal stenosis. No aneurysm or vascular malformation. Posterior circulation: Both vertebral arteries widely patent to the basilar. No basilar stenosis. Posterior circulation branch vessels are normal. Venous sinuses: Patent and normal. Anatomic variants: None significant. Review of the MIP images confirms the above findings IMPRESSION: 1. No large vessel occlusion or proximal stenosis. 2. Aortic atherosclerosis. 3. Atherosclerotic change at the right carotid bifurcation but no stenosis. 4. Atherosclerotic disease in both carotid siphon regions but without stenosis greater than 30-50% suspected. Aortic Atherosclerosis (ICD10-I70.0). Electronically Signed   By: Nelson Chimes M.D.   On: 04/27/2022 13:10   CT HEAD CODE STROKE WO CONTRAST  Result Date: 04/27/2022 CLINICAL DATA:  Code stroke. Neuro deficit, acute, stroke suspected. EXAM: CT HEAD WITHOUT CONTRAST TECHNIQUE: Contiguous axial images were obtained from the base of the skull through the vertex without intravenous contrast. RADIATION DOSE REDUCTION: This exam was performed according to the departmental dose-optimization program which includes automated exposure control, adjustment of the mA and/or kV according to patient size and/or use of iterative reconstruction technique. COMPARISON:  11/16/2021  FINDINGS: Brain: Generalized atrophy. No focal abnormality seen affecting the brainstem or cerebellum. Extensive chronic appearing small vessel ischemic changes of the cerebral hemispheric white matter. No sign of acute large vessel territory stroke. No mass lesion, hemorrhage, hydrocephalus or extra-axial collection. Vascular: There is atherosclerotic calcification of the major vessels at the base of the brain. Skull: Negative Sinuses/Orbits: Clear/normal Other: None ASPECTS ([place] Stroke Program Early CT Score) - Ganglionic level infarction (caudate, lentiform nuclei, internal capsule, insula, M1-M3 cortex): 7 - Supraganglionic infarction (M4-M6 cortex): 3 Total score (0-10 with 10 being normal): 10 IMPRESSION: 1. No acute CT finding. Atrophy and extensive chronic small-vessel ischemic changes of the white matter. 2. Aspects is 10. These results were communicated to Dr. Lorrin Goodell at 12:54 pm on 04/27/2022 by text page via the [facility] messaging system. Electronically Signed   By: Nelson Chimes M.D.   On: 04/27/2022 12:55   DG FEMUR MIN 2 VIEWS LEFT  Result Date: 04/26/2022 CLINICAL DATA:  Left hip ORIF EXAM: LEFT FEMUR 2 VIEWS COMPARISON:  04/25/2022 FINDINGS: Interval postsurgical changes from ORIF of intertrochanteric left femur fracture with long intramedullary rod, proximal lag screw, and distal interlocking screws. Anatomic alignment at the fracture site. No new fractures. No  malalignment. Expected postoperative changes within the overlying soft tissues. IMPRESSION: Interval ORIF of intertrochanteric left femur fracture. No postoperative complications are evident. Electronically Signed   By: Davina Poke D.O.   On: 04/26/2022 14:11   DG FEMUR MIN 2 VIEWS LEFT  Result Date: 04/26/2022 CLINICAL DATA:  Left hip surgery EXAM: LEFT FEMUR 2 VIEWS COMPARISON:  04/25/2022 FINDINGS: Six C-arm fluoroscopic images were obtained intraoperatively and submitted for post operative interpretation. Images  demonstrate placement of ORIF hardware traversing proximal left femur fracture with improved alignment. 105 seconds fluoroscopy time utilized. Radiation dose: 6.36 mGy. Please see the performing provider's procedural report for further detail. IMPRESSION: Intraoperative fluoroscopy provided for left hip ORIF. Electronically Signed   By: Davina Poke D.O.   On: 04/26/2022 13:31   DG C-Arm 1-60 Min-No Report  Result Date: 04/26/2022 Fluoroscopy was utilized by the requesting physician.  No radiographic interpretation.   DG Femur Min 2 Views Left  Result Date: 04/25/2022 CLINICAL DATA:  Left hip fracture. EXAM: LEFT FEMUR 2 VIEWS COMPARISON:  Earlier same day hip radiograph at 1915 hours FINDINGS: Comminuted intertrochanteric fracture, better visualized on earlier same day radiograph secondary to overlying soft tissues. No other acute fracture of the left femur. Partially visualized intramedullary rod with proximal locking screws in the tibia. Small ossification noted adjacent to the medial femoral condyle, likely sequela from prior trauma. No suprapatellar knee joint effusion. Vascular calcifications are seen in the left thigh and left lower leg. IMPRESSION: Acute comminuted intertrochanteric fracture, better visualized on earlier same day hip radiograph due to overlying soft tissues. No other acute fractures of the left femur. Electronically Signed   By: Ileana Roup M.D.   On: 04/25/2022 20:38   DG Chest 1 View  Result Date: 04/25/2022 CLINICAL DATA:  Fall EXAM: CHEST  1 VIEW COMPARISON:  Chest x-ray 09/27/2021 FINDINGS: The heart size and mediastinal contours are within normal limits. Both lungs are clear. The visualized skeletal structures are unremarkable. IMPRESSION: No active disease. Electronically Signed   By: Ronney Asters M.D.   On: 04/25/2022 19:49   DG Hip Unilat W or Wo Pelvis 2-3 Views Left  Result Date: 04/25/2022 CLINICAL DATA:  Hip pain, fall. EXAM: DG HIP (WITH OR WITHOUT  PELVIS) 2-3V LEFT COMPARISON:  None. FINDINGS: There is a comminuted left femoral intratrochanteric fracture with apex lateral angulation and overlying soft tissue swelling. There is no dislocation identified. IMPRESSION: Comminuted left femoral intratrochanteric fracture. Electronically Signed   By: Ronney Asters M.D.   On: 04/25/2022 19:44    Microbiology: Results for orders placed or performed during the hospital encounter of 04/25/22  Surgical PCR screen     Status: None   Collection Time: 04/25/22 11:56 PM   Specimen: Nasal Mucosa; Nasal Swab  Result Value Ref Range Status   MRSA, PCR NEGATIVE NEGATIVE Final   Staphylococcus aureus NEGATIVE NEGATIVE Final    Comment: (NOTE) The Xpert SA Assay (FDA approved for NASAL specimens in patients 66 years of age and older), is one component of a comprehensive surveillance program. It is not intended to diagnose infection nor to guide or monitor treatment. Performed at [facility], [place] [address] [place], [place] [address]   Respiratory (~20 pathogens) panel by PCR     Status: Abnormal   Collection Time: 05/01/22  9:03 AM   Specimen: Nasopharyngeal Swab; Respiratory  Result Value Ref Range Status   Adenovirus NOT DETECTED NOT DETECTED Final   Coronavirus 229E NOT DETECTED NOT DETECTED  Final    Comment: (NOTE) The Coronavirus on the Respiratory Panel, DOES NOT test for the novel  Coronavirus (2019 nCoV)    Coronavirus HKU1 NOT DETECTED NOT DETECTED Final   Coronavirus NL63 NOT DETECTED NOT DETECTED Final   Coronavirus OC43 NOT DETECTED NOT DETECTED Final   Metapneumovirus NOT DETECTED NOT DETECTED Final   Rhinovirus / Enterovirus NOT DETECTED NOT DETECTED Final   Influenza A H1 2009 DETECTED (A) NOT DETECTED Final   Influenza B NOT DETECTED NOT DETECTED Final   Parainfluenza Virus 1 NOT DETECTED NOT DETECTED Final   Parainfluenza Virus 2 NOT DETECTED NOT DETECTED Final   Parainfluenza Virus 3 NOT DETECTED NOT DETECTED Final    Parainfluenza Virus 4 NOT DETECTED NOT DETECTED Final   Respiratory Syncytial Virus NOT DETECTED NOT DETECTED Final   Bordetella pertussis NOT DETECTED NOT DETECTED Final   Bordetella Parapertussis NOT DETECTED NOT DETECTED Final   Chlamydophila pneumoniae NOT DETECTED NOT DETECTED Final   Mycoplasma pneumoniae NOT DETECTED NOT DETECTED Final    Comment: Performed at [facility], [place] [address], [place], [place] [address]  Resp panel by RT-PCR (RSV, Flu A&B, Covid) Anterior Nasal Swab     Status: Abnormal   Collection Time: 05/01/22  9:05 AM   Specimen: Anterior Nasal Swab  Result Value Ref Range Status   SARS Coronavirus 2 by RT PCR NEGATIVE NEGATIVE Final    Comment: (NOTE) SARS-CoV-2 target nucleic acids are NOT DETECTED.  The SARS-CoV-2 RNA is generally detectable in upper respiratory specimens during the acute phase of infection. The lowest concentration of SARS-CoV-2 viral copies this assay can detect is 138 copies/mL. A negative result does not preclude SARS-Cov-2 infection and should not be used as the sole basis for treatment or other patient management decisions. A negative result may occur with  improper specimen collection/handling, submission of specimen other than nasopharyngeal swab, presence of viral mutation(s) within the areas targeted by this assay, and inadequate number of viral copies(<138 copies/mL). A negative result must be combined with clinical observations, patient history, and epidemiological information. The expected result is Negative.  Fact Sheet for Patients:  EntrepreneurPulse.com.au  Fact Sheet for Healthcare Providers:  IncredibleEmployment.be  This test is no t yet approved or cleared by the [place] FDA and  has been authorized for detection and/or diagnosis of SARS-CoV-2 by FDA under an Emergency Use Authorization (EUA). This EUA will remain  in effect (meaning this test can be used) for the  duration of the COVID-19 declaration under Section 564(b)(1) of the Act, 21 U.S.C.section 360bbb-3(b)(1), unless the authorization is terminated  or revoked sooner.       Influenza A by PCR POSITIVE (A) NEGATIVE Final   Influenza B by PCR NEGATIVE NEGATIVE Final    Comment: (NOTE) The Xpert Xpress SARS-CoV-2/FLU/RSV plus assay is intended as an aid in the diagnosis of influenza from Nasopharyngeal swab specimens and should not be used as a sole basis for treatment. Nasal washings and aspirates are unacceptable for Xpert Xpress SARS-CoV-2/FLU/RSV testing.  Fact Sheet for Patients: EntrepreneurPulse.com.au  Fact Sheet for Healthcare Providers: IncredibleEmployment.be  This test is not yet approved or cleared by the [place] FDA and has been authorized for detection and/or diagnosis of SARS-CoV-2 by FDA under an Emergency Use Authorization (EUA). This EUA will remain in effect (meaning this test can be used) for the duration of the COVID-19 declaration under Section 564(b)(1) of the Act, 21 U.S.C. section 360bbb-3(b)(1), unless the authorization is terminated or  revoked.     Resp Syncytial Virus by PCR NEGATIVE NEGATIVE Final    Comment: (NOTE) Fact Sheet for Patients: EntrepreneurPulse.com.au  Fact Sheet for Healthcare Providers: IncredibleEmployment.be  This test is not yet approved or cleared by the [place] FDA and has been authorized for detection and/or diagnosis of SARS-CoV-2 by FDA under an Emergency Use Authorization (EUA). This EUA will remain in effect (meaning this test can be used) for the duration of the COVID-19 declaration under Section 564(b)(1) of the Act, 21 U.S.C. section 360bbb-3(b)(1), unless the authorization is terminated or revoked.  Performed at [facility], [place] [address], [place], [place] [address]     Labs: CBC: Recent Labs  Lab 05/03/22 [phone]  05/05/22 0421 05/06/22 0655  WBC 4.3 4.4 4.5  HGB 9.2* 9.4* 8.9*  HCT 27.7* 28.5* 26.5*  MCV 90.2 90.5 89.5  PLT 148* 184 545   Basic Metabolic Panel: Recent Labs  Lab 05/03/22 0658  NA 138  K 4.6  CL 109  CO2 24  GLUCOSE 162*  BUN 16  CREATININE 1.11*  CALCIUM 8.9  MG 1.5*   Liver Function Tests: No results for input(s): "AST", "ALT", "ALKPHOS", "BILITOT", "PROT", "ALBUMIN" in the last 168 hours. CBG: Recent Labs  Lab 05/05/22 1720 05/05/22 1725 05/05/22 2144 05/06/22 0613 05/06/22 0857  GLUCAP 145* 142* 196* 134* 173*    Discharge time spent: greater than 30 minutes.  Signed: Estill Cotta, MD [facility] 05/06/2022
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
## 2022-05-08 DIAGNOSIS — E1122 Type 2 diabetes mellitus with diabetic chronic kidney disease: Secondary | ICD-10-CM | POA: Diagnosis not present

## 2022-05-08 DIAGNOSIS — E43 Unspecified severe protein-calorie malnutrition: Secondary | ICD-10-CM | POA: Diagnosis not present

## 2022-05-08 DIAGNOSIS — Z8679 Personal history of other diseases of the circulatory system: Secondary | ICD-10-CM | POA: Diagnosis not present

## 2022-05-08 DIAGNOSIS — Z6826 Body mass index (BMI) 26.0-26.9, adult: Secondary | ICD-10-CM | POA: Diagnosis not present

## 2022-05-08 DIAGNOSIS — Z8709 Personal history of other diseases of the respiratory system: Secondary | ICD-10-CM | POA: Diagnosis not present

## 2022-05-08 DIAGNOSIS — S79192S Other physeal fracture of lower end of left femur, sequela: Secondary | ICD-10-CM | POA: Diagnosis not present

## 2022-05-08 DIAGNOSIS — I82412 Acute embolism and thrombosis of left femoral vein: Secondary | ICD-10-CM | POA: Diagnosis not present

## 2022-05-08 DIAGNOSIS — I110 Hypertensive heart disease with heart failure: Secondary | ICD-10-CM | POA: Diagnosis not present

## 2022-05-08 DIAGNOSIS — N1832 Chronic kidney disease, stage 3b: Secondary | ICD-10-CM | POA: Diagnosis not present

## 2022-05-08 DIAGNOSIS — M6281 Muscle weakness (generalized): Secondary | ICD-10-CM | POA: Diagnosis not present

## 2022-05-11 DIAGNOSIS — E785 Hyperlipidemia, unspecified: Secondary | ICD-10-CM | POA: Insufficient documentation

## 2022-05-12 DIAGNOSIS — E43 Unspecified severe protein-calorie malnutrition: Secondary | ICD-10-CM | POA: Diagnosis not present

## 2022-05-12 DIAGNOSIS — R55 Syncope and collapse: Secondary | ICD-10-CM | POA: Diagnosis not present

## 2022-05-12 DIAGNOSIS — D518 Other vitamin B12 deficiency anemias: Secondary | ICD-10-CM | POA: Diagnosis not present

## 2022-05-12 DIAGNOSIS — I82412 Acute embolism and thrombosis of left femoral vein: Secondary | ICD-10-CM | POA: Diagnosis not present

## 2022-05-12 DIAGNOSIS — E1122 Type 2 diabetes mellitus with diabetic chronic kidney disease: Secondary | ICD-10-CM | POA: Diagnosis not present

## 2022-05-12 DIAGNOSIS — I10 Essential (primary) hypertension: Secondary | ICD-10-CM | POA: Diagnosis not present

## 2022-05-12 DIAGNOSIS — N1832 Chronic kidney disease, stage 3b: Secondary | ICD-10-CM | POA: Diagnosis not present

## 2022-05-12 DIAGNOSIS — S79192S Other physeal fracture of lower end of left femur, sequela: Secondary | ICD-10-CM | POA: Diagnosis not present

## 2022-05-12 DIAGNOSIS — J101 Influenza due to other identified influenza virus with other respiratory manifestations: Secondary | ICD-10-CM | POA: Diagnosis not present

## 2022-05-15 DIAGNOSIS — L03116 Cellulitis of left lower limb: Secondary | ICD-10-CM | POA: Diagnosis not present

## 2022-05-15 DIAGNOSIS — S79192S Other physeal fracture of lower end of left femur, sequela: Secondary | ICD-10-CM | POA: Diagnosis not present

## 2022-05-15 DIAGNOSIS — M6281 Muscle weakness (generalized): Secondary | ICD-10-CM | POA: Diagnosis not present

## 2022-05-15 DIAGNOSIS — I110 Hypertensive heart disease with heart failure: Secondary | ICD-10-CM | POA: Diagnosis not present

## 2022-05-15 DIAGNOSIS — S72142A Displaced intertrochanteric fracture of left femur, initial encounter for closed fracture: Secondary | ICD-10-CM | POA: Diagnosis not present

## 2022-05-15 DIAGNOSIS — E1122 Type 2 diabetes mellitus with diabetic chronic kidney disease: Secondary | ICD-10-CM | POA: Diagnosis not present

## 2022-05-15 DIAGNOSIS — N1832 Chronic kidney disease, stage 3b: Secondary | ICD-10-CM | POA: Diagnosis not present

## 2022-05-15 DIAGNOSIS — Z8679 Personal history of other diseases of the circulatory system: Secondary | ICD-10-CM | POA: Diagnosis not present

## 2022-05-15 DIAGNOSIS — Z8709 Personal history of other diseases of the respiratory system: Secondary | ICD-10-CM | POA: Diagnosis not present

## 2022-05-15 DIAGNOSIS — I82412 Acute embolism and thrombosis of left femoral vein: Secondary | ICD-10-CM | POA: Diagnosis not present

## 2022-05-15 DIAGNOSIS — Z6826 Body mass index (BMI) 26.0-26.9, adult: Secondary | ICD-10-CM | POA: Diagnosis not present

## 2022-05-15 DIAGNOSIS — E43 Unspecified severe protein-calorie malnutrition: Secondary | ICD-10-CM | POA: Diagnosis not present

## 2022-05-17 ENCOUNTER — Emergency Department: Payer: Medicare Other

## 2022-05-17 ENCOUNTER — Other Ambulatory Visit: Payer: Self-pay

## 2022-05-17 ENCOUNTER — Inpatient Hospital Stay
Admission: EM | Admit: 2022-05-17 | Discharge: 2022-05-21 | DRG: 270 | Disposition: A | Discharge status: Skilled Nursing Facility | Payer: Medicare Other | Source: Skilled Nursing Facility | Attending: Internal Medicine | Admitting: Internal Medicine

## 2022-05-17 DIAGNOSIS — R600 Localized edema: Secondary | ICD-10-CM | POA: Diagnosis not present

## 2022-05-17 DIAGNOSIS — I129 Hypertensive chronic kidney disease with stage 1 through stage 4 chronic kidney disease, or unspecified chronic kidney disease: Secondary | ICD-10-CM | POA: Diagnosis present

## 2022-05-17 DIAGNOSIS — Z7901 Long term (current) use of anticoagulants: Secondary | ICD-10-CM | POA: Diagnosis not present

## 2022-05-17 DIAGNOSIS — W19XXXD Unspecified fall, subsequent encounter: Secondary | ICD-10-CM | POA: Diagnosis not present

## 2022-05-17 DIAGNOSIS — N1832 Chronic kidney disease, stage 3b: Secondary | ICD-10-CM | POA: Diagnosis present

## 2022-05-17 DIAGNOSIS — N170 Acute kidney failure with tubular necrosis: Secondary | ICD-10-CM | POA: Diagnosis not present

## 2022-05-17 DIAGNOSIS — I82422 Acute embolism and thrombosis of left iliac vein: Secondary | ICD-10-CM | POA: Diagnosis not present

## 2022-05-17 DIAGNOSIS — E1122 Type 2 diabetes mellitus with diabetic chronic kidney disease: Secondary | ICD-10-CM | POA: Diagnosis present

## 2022-05-17 DIAGNOSIS — E1165 Type 2 diabetes mellitus with hyperglycemia: Secondary | ICD-10-CM | POA: Diagnosis present

## 2022-05-17 DIAGNOSIS — Z888 Allergy status to other drugs, medicaments and biological substances status: Secondary | ICD-10-CM | POA: Diagnosis not present

## 2022-05-17 DIAGNOSIS — Z90710 Acquired absence of both cervix and uterus: Secondary | ICD-10-CM | POA: Diagnosis not present

## 2022-05-17 DIAGNOSIS — I871 Compression of vein: Secondary | ICD-10-CM | POA: Diagnosis present

## 2022-05-17 DIAGNOSIS — S72142D Displaced intertrochanteric fracture of left femur, subsequent encounter for closed fracture with routine healing: Secondary | ICD-10-CM | POA: Diagnosis not present

## 2022-05-17 DIAGNOSIS — Z95828 Presence of other vascular implants and grafts: Secondary | ICD-10-CM | POA: Diagnosis not present

## 2022-05-17 DIAGNOSIS — E1140 Type 2 diabetes mellitus with diabetic neuropathy, unspecified: Secondary | ICD-10-CM | POA: Diagnosis not present

## 2022-05-17 DIAGNOSIS — Z885 Allergy status to narcotic agent status: Secondary | ICD-10-CM | POA: Diagnosis not present

## 2022-05-17 DIAGNOSIS — E559 Vitamin D deficiency, unspecified: Secondary | ICD-10-CM | POA: Diagnosis not present

## 2022-05-17 DIAGNOSIS — I8290 Acute embolism and thrombosis of unspecified vein: Principal | ICD-10-CM

## 2022-05-17 DIAGNOSIS — Z8249 Family history of ischemic heart disease and other diseases of the circulatory system: Secondary | ICD-10-CM

## 2022-05-17 DIAGNOSIS — R609 Edema, unspecified: Secondary | ICD-10-CM | POA: Diagnosis not present

## 2022-05-17 DIAGNOSIS — K219 Gastro-esophageal reflux disease without esophagitis: Secondary | ICD-10-CM | POA: Diagnosis present

## 2022-05-17 DIAGNOSIS — Z841 Family history of disorders of kidney and ureter: Secondary | ICD-10-CM

## 2022-05-17 DIAGNOSIS — Z86718 Personal history of other venous thrombosis and embolism: Secondary | ICD-10-CM | POA: Diagnosis not present

## 2022-05-17 DIAGNOSIS — G510 Bell's palsy: Secondary | ICD-10-CM | POA: Diagnosis not present

## 2022-05-17 DIAGNOSIS — I10 Essential (primary) hypertension: Secondary | ICD-10-CM | POA: Diagnosis not present

## 2022-05-17 DIAGNOSIS — M7989 Other specified soft tissue disorders: Secondary | ICD-10-CM | POA: Diagnosis not present

## 2022-05-17 DIAGNOSIS — D62 Acute posthemorrhagic anemia: Secondary | ICD-10-CM | POA: Diagnosis not present

## 2022-05-17 DIAGNOSIS — W19XXXA Unspecified fall, initial encounter: Secondary | ICD-10-CM | POA: Diagnosis not present

## 2022-05-17 DIAGNOSIS — R2242 Localized swelling, mass and lump, left lower limb: Secondary | ICD-10-CM | POA: Diagnosis not present

## 2022-05-17 DIAGNOSIS — E43 Unspecified severe protein-calorie malnutrition: Secondary | ICD-10-CM | POA: Diagnosis not present

## 2022-05-17 DIAGNOSIS — E538 Deficiency of other specified B group vitamins: Secondary | ICD-10-CM | POA: Diagnosis not present

## 2022-05-17 DIAGNOSIS — Z7984 Long term (current) use of oral hypoglycemic drugs: Secondary | ICD-10-CM | POA: Diagnosis not present

## 2022-05-17 DIAGNOSIS — Z79899 Other long term (current) drug therapy: Secondary | ICD-10-CM | POA: Diagnosis not present

## 2022-05-17 DIAGNOSIS — Z833 Family history of diabetes mellitus: Secondary | ICD-10-CM

## 2022-05-17 DIAGNOSIS — Z7401 Bed confinement status: Secondary | ICD-10-CM | POA: Diagnosis not present

## 2022-05-17 LAB — COMPREHENSIVE METABOLIC PANEL
ALT: 20 U/L (ref 0–44)
AST: 25 U/L (ref 15–41)
Albumin: 2.9 g/dL — ABNORMAL LOW (ref 3.5–5.0)
Alkaline Phosphatase: 146 U/L — ABNORMAL HIGH (ref 38–126)
Anion gap: 10 (ref 5–15)
BUN: 26 mg/dL — ABNORMAL HIGH (ref 8–23)
CO2: 20 mmol/L — ABNORMAL LOW (ref 22–32)
Calcium: 9.2 mg/dL (ref 8.9–10.3)
Chloride: 106 mmol/L (ref 98–111)
Creatinine, Ser: 1.59 mg/dL — ABNORMAL HIGH (ref 0.44–1.00)
GFR, Estimated: 32 mL/min — ABNORMAL LOW (ref >60)
Glucose, Bld: 268 mg/dL — ABNORMAL HIGH (ref 70–99)
Potassium: 4.4 mmol/L (ref 3.5–5.1)
Sodium: 136 mmol/L (ref 135–145)
Total Bilirubin: 0.7 mg/dL (ref 0.3–1.2)
Total Protein: 5.9 g/dL — ABNORMAL LOW (ref 6.5–8.1)

## 2022-05-17 LAB — CBC WITH DIFFERENTIAL/PLATELET
Abs Immature Granulocytes: 0.03 10*3/uL (ref 0.00–0.07)
Basophils Absolute: 0 10*3/uL (ref 0.0–0.1)
Basophils Relative: 0 %
Eosinophils Absolute: 0.2 10*3/uL (ref 0.0–0.5)
Eosinophils Relative: 3 %
HCT: 28.8 % — ABNORMAL LOW (ref 36.0–46.0)
Hemoglobin: 9.2 g/dL — ABNORMAL LOW (ref 12.0–15.0)
Immature Granulocytes: 0 %
Lymphocytes Relative: 25 %
Lymphs Abs: 1.7 10*3/uL (ref 0.7–4.0)
MCH: 29.6 pg (ref 26.0–34.0)
MCHC: 31.9 g/dL (ref 30.0–36.0)
MCV: 92.6 fL (ref 80.0–100.0)
Monocytes Absolute: 0.7 10*3/uL (ref 0.1–1.0)
Monocytes Relative: 10 %
Neutro Abs: 4.2 10*3/uL (ref 1.7–7.7)
Neutrophils Relative %: 62 %
Platelets: 269 10*3/uL (ref 150–400)
RBC: 3.11 MIL/uL — ABNORMAL LOW (ref 3.87–5.11)
RDW: 14.6 % (ref 11.5–15.5)
WBC: 6.8 10*3/uL (ref 4.0–10.5)
nRBC: 0 % (ref 0.0–0.2)

## 2022-05-17 MED ORDER — SODIUM CHLORIDE 0.9 % IV BOLUS (SEPSIS)
500.0000 mL | Freq: Once | INTRAVENOUS | Status: AC
  Administered 2022-05-17: 500 mL via INTRAVENOUS

## 2022-05-17 NOTE — ED Triage Notes (Signed)
First nurse note: pt presents to ER via ems from [facility] with c/o left leg swelling.  Per ems, pt broke left femur on 12/17 and had surgery.  Pt also has hx of DVT in same leg.  Pt is on eliquis at home.

## 2022-05-17 NOTE — ED Triage Notes (Signed)
[facility] called EMS for c/o of L leg swelling. Pt currently has no complaints. Swelling noted to L leg with healing incisions. Per pt chart pt had fall and femur fx in December. Unclear from pt if swelling is worse tonight. Pt does have hx of DVT and is on daily thinners.

## 2022-05-17 NOTE — ED Provider Notes (Signed)
[facility] Provider Note    Event Date/Time   First MD Initiated Contact with Patient 05/17/22 2046     (approximate)   History   Leg Swelling   HPI  Brandy Gonzales is a 82 y.o. female who presents with complaints of leg swelling.  Patient reportedly has known DVT in the left leg status post leg surgery, is on Eliquis.  Sent to the emergency department for worsening leg swelling.  Has also been treated with antibiotics for wound on the anterior lower leg which has apparently gotten much better per daughter.     Physical Exam   Triage Vital Signs: ED Triage Vitals [05/17/22 2007]  Enc Vitals Group     BP 128/72     Pulse Rate 75     Resp 18     Temp 97.9 F (36.6 C)     Temp Source Oral     SpO2 96 %     Weight 60.5 kg (133 lb 6.1 oz)     Height 1.499 m (4\' 11" )     Head Circumference      Peak Flow      Pain Score 0     Pain Loc      Pain Edu?      Excl. in [place]?     Most recent vital signs: Vitals:   05/20/22 0400 05/20/22 0733  BP: (!) 149/70 (!) 141/71  Pulse: 66 76  Resp:  16  Temp: 98.5 F (36.9 C) 98.5 F (36.9 C)  SpO2: 100% 100%     General: Awake, no distress.  CV:  Good peripheral perfusion.  Resp:  Normal effort.  Clear to auscultation bilaterally Abd:  No distention.  Other:  Left lower leg: Significantly larger than right leg.  Abrasion/shallow ulceration to the anterior left lower leg with bandage over top, mild surrounding erythema.   ED Results / Procedures / Treatments   Labs (all labs ordered are listed, but only abnormal results are displayed) Labs Reviewed  CBC WITH DIFFERENTIAL/PLATELET - Abnormal; Notable for the following components:      Result Value   RBC 3.11 (*)    Hemoglobin 9.2 (*)    HCT 28.8 (*)    All other components within normal limits  COMPREHENSIVE METABOLIC PANEL - Abnormal; Notable for the following components:   CO2 20 (*)    Glucose, Bld 268 (*)    BUN 26 (*)    Creatinine, Ser  1.59 (*)    Total Protein 5.9 (*)    Albumin 2.9 (*)    Alkaline Phosphatase 146 (*)    GFR, Estimated 32 (*)    All other components within normal limits  HEPARIN LEVEL (UNFRACTIONATED) - Abnormal; Notable for the following components:   Heparin Unfractionated >1.10 (*)    All other components within normal limits  PROTIME-INR - Abnormal; Notable for the following components:   Prothrombin Time 18.5 (*)    INR 1.6 (*)    All other components within normal limits  APTT - Abnormal; Notable for the following components:   aPTT 51 (*)    All other components within normal limits  APTT - Abnormal; Notable for the following components:   aPTT >200 (*)    All other components within normal limits  BASIC METABOLIC PANEL - Abnormal; Notable for the following components:   Glucose, Bld 191 (*)    Creatinine, Ser 1.50 (*)    Calcium 8.7 (*)  GFR, Estimated 35 (*)    All other components within normal limits  CBC - Abnormal; Notable for the following components:   RBC 2.75 (*)    Hemoglobin 8.2 (*)    HCT 25.0 (*)    All other components within normal limits  GLUCOSE, CAPILLARY - Abnormal; Notable for the following components:   Glucose-Capillary 109 (*)    All other components within normal limits  APTT - Abnormal; Notable for the following components:   aPTT >200 (*)    All other components within normal limits  HEPARIN LEVEL (UNFRACTIONATED) - Abnormal; Notable for the following components:   Heparin Unfractionated >1.10 (*)    All other components within normal limits  GLUCOSE, CAPILLARY - Abnormal; Notable for the following components:   Glucose-Capillary 249 (*)    All other components within normal limits  GLUCOSE, CAPILLARY - Abnormal; Notable for the following components:   Glucose-Capillary 154 (*)    All other components within normal limits  GLUCOSE, CAPILLARY - Abnormal; Notable for the following components:   Glucose-Capillary 201 (*)    All other components within  normal limits  APTT - Abnormal; Notable for the following components:   aPTT 125 (*)    All other components within normal limits  GLUCOSE, CAPILLARY - Abnormal; Notable for the following components:   Glucose-Capillary 212 (*)    All other components within normal limits  APTT - Abnormal; Notable for the following components:   aPTT 107 (*)    All other components within normal limits  BASIC METABOLIC PANEL - Abnormal; Notable for the following components:   Glucose, Bld 150 (*)    Creatinine, Ser 1.61 (*)    Calcium 8.8 (*)    GFR, Estimated 32 (*)    All other components within normal limits  CBC - Abnormal; Notable for the following components:   RBC 2.67 (*)    Hemoglobin 7.9 (*)    HCT 24.3 (*)    All other components within normal limits  HEPARIN LEVEL (UNFRACTIONATED) - Abnormal; Notable for the following components:   Heparin Unfractionated >1.10 (*)    All other components within normal limits  GLUCOSE, CAPILLARY - Abnormal; Notable for the following components:   Glucose-Capillary 135 (*)    All other components within normal limits  APTT - Abnormal; Notable for the following components:   aPTT 72 (*)    All other components within normal limits  GLUCOSE, CAPILLARY - Abnormal; Notable for the following components:   Glucose-Capillary 131 (*)    All other components within normal limits  CBG MONITORING, ED - Abnormal; Notable for the following components:   Glucose-Capillary 175 (*)    All other components within normal limits  CBG MONITORING, ED - Abnormal; Notable for the following components:   Glucose-Capillary 106 (*)    All other components within normal limits  CBG MONITORING, ED - Abnormal; Notable for the following components:   Glucose-Capillary 113 (*)    All other components within normal limits  GLUCOSE, CAPILLARY  MAGNESIUM  GLUCOSE, CAPILLARY  MAGNESIUM  APTT     EKG     RADIOLOGY Ultrasound lower  extremity    PROCEDURES:  Critical Care performed:   Procedures   MEDICATIONS ORDERED IN ED: Medications  carvedilol (COREG) tablet 25 mg (25 mg Oral Given 05/20/22 0840)  pantoprazole (PROTONIX) EC tablet 40 mg (40 mg Oral Given 05/20/22 0840)  acetaminophen (TYLENOL) tablet 650 mg ( Oral MAR Unhold 05/18/22 2214)  Or  acetaminophen (TYLENOL) suppository 650 mg ( Rectal MAR Unhold 05/18/22 2214)  ondansetron (ZOFRAN) tablet 4 mg ( Oral MAR Unhold 05/18/22 2214)  HYDROcodone-acetaminophen (NORCO/VICODIN) 5-325 MG per tablet 1-2 tablet ( Oral MAR Unhold 05/18/22 2214)  morphine (PF) 2 MG/ML injection 2 mg ( Intravenous MAR Unhold 05/18/22 2214)  ipratropium-albuterol (DUONEB) 0.5-2.5 (3) MG/3ML nebulizer solution 3 mL ( Nebulization MAR Unhold 05/18/22 2214)  hydrALAZINE (APRESOLINE) injection 10 mg ( Intravenous MAR Unhold 05/18/22 2214)  metoprolol tartrate (LOPRESSOR) injection 5 mg ( Intravenous MAR Unhold 05/18/22 2214)  oxyCODONE (Oxy IR/ROXICODONE) immediate release tablet 5 mg ( Oral MAR Unhold 05/18/22 2214)  senna-docusate (Senokot-S) tablet 1 tablet ( Oral MAR Unhold 05/18/22 2214)  guaiFENesin (ROBITUSSIN) 100 MG/5ML liquid 5 mL ( Oral MAR Unhold 05/18/22 2214)  traZODone (DESYREL) tablet 50 mg ( Oral MAR Unhold 05/18/22 2214)  ondansetron (ZOFRAN) injection 4 mg ( Intravenous MAR Unhold 05/18/22 2214)  HYDROmorphone (DILAUDID) injection 1 mg ( Intravenous MAR Unhold 05/18/22 2214)  fentaNYL (SUBLIMAZE) injection 12.5 mcg ( Intravenous MAR Unhold 05/18/22 2214)  heparin sodium (porcine) 1000 UNIT/ML injection (has no administration in time range)  fentaNYL (SUBLIMAZE) 50 MCG/ML injection (has no administration in time range)  midazolam (VERSED) 2 MG/2ML injection (has no administration in time range)  heparin ADULT infusion 100 units/mL (25000 units/228mL) (400 Units/hr Intravenous Infusion Verify 05/20/22 0400)  insulin aspart (novoLOG) injection 0-9 Units ( Subcutaneous Patient Refused/Not Given  05/20/22 0841)  sodium chloride 0.9 % bolus 500 mL (0 mLs Intravenous Stopped 05/18/22 0129)  iohexol (OMNIPAQUE) 350 MG/ML injection 125 mL (125 mLs Intravenous Contrast Given 05/18/22 0010)  sodium chloride 0.9 % bolus 500 mL (0 mLs Intravenous Stopped 05/18/22 0341)  ceFAZolin (ANCEF) IVPB 2g/100 mL premix (0 g Intravenous Stopped 05/18/22 1658)     IMPRESSION / MDM / ASSESSMENT AND PLAN / ED COURSE  I reviewed the triage vital signs and the nursing notes. Patient's presentation is most consistent with acute presentation with potential threat to life or bodily function.   Patient presents with worsening left lower leg swelling.  Differential includes infection, worsening DVT  Lab work overall reassuring, normal white blood cell count, patient afebrile, doubt infection based on exam, pending ultrasound  Have asked my colleague to follow up on ultrasound result       FINAL CLINICAL IMPRESSION(S) / ED DIAGNOSES   Final diagnoses:  Left leg swelling  Thrombosis     Rx / DC Orders   ED Discharge Orders     None        Note:  This document was prepared using Dragon voice recognition software and may include unintentional dictation errors.   Lavonia Drafts, MD 05/20/22 1049

## 2022-05-17 NOTE — ED Provider Notes (Signed)
11:30 PM  Assumed care at shift change.  No occlusive DVT seen on Doppler study but lack of respiratory variation in the femoral vein suggesting more central obstruction radiologist recommends CT venography of the abdomen pelvis.  Will give IV fluids given GFR of 32.  2:00 AM  Pt's CT scan reviewed and interpreted by myself and the radiologist and shows occlusive clot in the left common iliac vein.  She has an IVC filter.  Denies any chest pain or shortness of breath.  She has a palpable pulse on exam with normal cap refill and normal sensation.  Compartments in her leg are soft.  No concern for cerulea dolens.  Will start heparin and admit to the hospitalist.  Vascular surgery will need to be consulted not emergently.   Consulted and discussed patient's case with hospitalist, Dr. Para March.  I have recommended admission and consulting physician agrees and will place admission orders.  Patient (and family if present) agree with this plan.   I reviewed all nursing notes, vitals, pertinent previous records.  All labs, EKGs, imaging ordered have been independently reviewed and interpreted by myself.    Rotha Cassels, Layla Maw, DO 05/18/22 0210

## 2022-05-18 ENCOUNTER — Encounter
Admission: EM | Disposition: A | Discharge status: Skilled Nursing Facility | Payer: Self-pay | Source: Skilled Nursing Facility | Attending: Internal Medicine

## 2022-05-18 ENCOUNTER — Encounter: Payer: Self-pay | Admitting: Internal Medicine

## 2022-05-18 DIAGNOSIS — W19XXXA Unspecified fall, initial encounter: Secondary | ICD-10-CM | POA: Diagnosis not present

## 2022-05-18 DIAGNOSIS — Z7984 Long term (current) use of oral hypoglycemic drugs: Secondary | ICD-10-CM | POA: Diagnosis not present

## 2022-05-18 DIAGNOSIS — I129 Hypertensive chronic kidney disease with stage 1 through stage 4 chronic kidney disease, or unspecified chronic kidney disease: Secondary | ICD-10-CM | POA: Diagnosis present

## 2022-05-18 DIAGNOSIS — M7989 Other specified soft tissue disorders: Secondary | ICD-10-CM

## 2022-05-18 DIAGNOSIS — D62 Acute posthemorrhagic anemia: Secondary | ICD-10-CM | POA: Diagnosis not present

## 2022-05-18 DIAGNOSIS — Z90710 Acquired absence of both cervix and uterus: Secondary | ICD-10-CM | POA: Diagnosis not present

## 2022-05-18 DIAGNOSIS — Z7401 Bed confinement status: Secondary | ICD-10-CM | POA: Diagnosis not present

## 2022-05-18 DIAGNOSIS — Z888 Allergy status to other drugs, medicaments and biological substances status: Secondary | ICD-10-CM | POA: Diagnosis not present

## 2022-05-18 DIAGNOSIS — Z841 Family history of disorders of kidney and ureter: Secondary | ICD-10-CM | POA: Diagnosis not present

## 2022-05-18 DIAGNOSIS — G510 Bell's palsy: Secondary | ICD-10-CM | POA: Diagnosis not present

## 2022-05-18 DIAGNOSIS — E538 Deficiency of other specified B group vitamins: Secondary | ICD-10-CM | POA: Diagnosis not present

## 2022-05-18 DIAGNOSIS — E559 Vitamin D deficiency, unspecified: Secondary | ICD-10-CM | POA: Diagnosis not present

## 2022-05-18 DIAGNOSIS — N170 Acute kidney failure with tubular necrosis: Secondary | ICD-10-CM | POA: Diagnosis not present

## 2022-05-18 DIAGNOSIS — E1122 Type 2 diabetes mellitus with diabetic chronic kidney disease: Secondary | ICD-10-CM | POA: Diagnosis present

## 2022-05-18 DIAGNOSIS — Z8249 Family history of ischemic heart disease and other diseases of the circulatory system: Secondary | ICD-10-CM | POA: Diagnosis not present

## 2022-05-18 DIAGNOSIS — E43 Unspecified severe protein-calorie malnutrition: Secondary | ICD-10-CM | POA: Diagnosis not present

## 2022-05-18 DIAGNOSIS — N1832 Chronic kidney disease, stage 3b: Secondary | ICD-10-CM | POA: Diagnosis present

## 2022-05-18 DIAGNOSIS — Z95828 Presence of other vascular implants and grafts: Secondary | ICD-10-CM | POA: Diagnosis not present

## 2022-05-18 DIAGNOSIS — E1165 Type 2 diabetes mellitus with hyperglycemia: Secondary | ICD-10-CM | POA: Diagnosis present

## 2022-05-18 DIAGNOSIS — Z833 Family history of diabetes mellitus: Secondary | ICD-10-CM | POA: Diagnosis not present

## 2022-05-18 DIAGNOSIS — Z7901 Long term (current) use of anticoagulants: Secondary | ICD-10-CM | POA: Diagnosis not present

## 2022-05-18 DIAGNOSIS — E1140 Type 2 diabetes mellitus with diabetic neuropathy, unspecified: Secondary | ICD-10-CM | POA: Diagnosis not present

## 2022-05-18 DIAGNOSIS — Z885 Allergy status to narcotic agent status: Secondary | ICD-10-CM | POA: Diagnosis not present

## 2022-05-18 DIAGNOSIS — Z86718 Personal history of other venous thrombosis and embolism: Secondary | ICD-10-CM | POA: Diagnosis present

## 2022-05-18 DIAGNOSIS — I8290 Acute embolism and thrombosis of unspecified vein: Secondary | ICD-10-CM | POA: Diagnosis present

## 2022-05-18 DIAGNOSIS — I871 Compression of vein: Secondary | ICD-10-CM

## 2022-05-18 DIAGNOSIS — W19XXXD Unspecified fall, subsequent encounter: Secondary | ICD-10-CM | POA: Diagnosis not present

## 2022-05-18 DIAGNOSIS — I82422 Acute embolism and thrombosis of left iliac vein: Secondary | ICD-10-CM | POA: Diagnosis present

## 2022-05-18 DIAGNOSIS — I10 Essential (primary) hypertension: Secondary | ICD-10-CM | POA: Diagnosis not present

## 2022-05-18 DIAGNOSIS — R600 Localized edema: Secondary | ICD-10-CM | POA: Diagnosis not present

## 2022-05-18 DIAGNOSIS — K219 Gastro-esophageal reflux disease without esophagitis: Secondary | ICD-10-CM | POA: Diagnosis present

## 2022-05-18 DIAGNOSIS — S72142D Displaced intertrochanteric fracture of left femur, subsequent encounter for closed fracture with routine healing: Secondary | ICD-10-CM | POA: Diagnosis not present

## 2022-05-18 DIAGNOSIS — Z79899 Other long term (current) drug therapy: Secondary | ICD-10-CM | POA: Diagnosis not present

## 2022-05-18 HISTORY — PX: PERIPHERAL VASCULAR THROMBECTOMY: CATH118306

## 2022-05-18 LAB — CBG MONITORING, ED
Glucose-Capillary: 175 mg/dL — ABNORMAL HIGH (ref 70–99)
Glucose-Capillary: 106 mg/dL — ABNORMAL HIGH (ref 70–99)
Glucose-Capillary: 113 mg/dL — ABNORMAL HIGH (ref 70–99)

## 2022-05-18 LAB — PROTIME-INR
INR: 1.6 — ABNORMAL HIGH (ref 0.8–1.2)
Prothrombin Time: 18.5 seconds — ABNORMAL HIGH (ref 11.4–15.2)

## 2022-05-18 LAB — GLUCOSE, CAPILLARY
Glucose-Capillary: 82 mg/dL (ref 70–99)
Glucose-Capillary: 109 mg/dL — ABNORMAL HIGH (ref 70–99)
Glucose-Capillary: 249 mg/dL — ABNORMAL HIGH (ref 70–99)

## 2022-05-18 LAB — HEPARIN LEVEL (UNFRACTIONATED): Heparin Unfractionated: >1.1 IU/mL — ABNORMAL HIGH (ref 0.30–0.70)

## 2022-05-18 LAB — APTT
aPTT: 51 seconds — ABNORMAL HIGH (ref 24–36)
aPTT: >200 seconds (ref 24–36)

## 2022-05-18 SURGERY — PERIPHERAL VASCULAR THROMBECTOMY
Anesthesia: Moderate Sedation | Laterality: Left

## 2022-05-18 MED ORDER — IPRATROPIUM-ALBUTEROL 0.5-2.5 (3) MG/3ML IN SOLN
RESPIRATORY_TRACT | Status: AC
  Administered 2022-05-18: 3 mL via RESPIRATORY_TRACT
  Filled 2022-05-18: qty 3

## 2022-05-18 MED ORDER — DIPHENHYDRAMINE HCL 50 MG/ML IJ SOLN: 50.0000 mg | Freq: Once | INTRAMUSCULAR | Status: DC | PRN

## 2022-05-18 MED ORDER — FENTANYL CITRATE PF 50 MCG/ML IJ SOSY: 12.5000 ug | PREFILLED_SYRINGE | Freq: Once | INTRAMUSCULAR | Status: DC | PRN

## 2022-05-18 MED ORDER — METOPROLOL TARTRATE 5 MG/5ML IV SOLN: 5.0000 mg | INTRAVENOUS | Status: DC | PRN

## 2022-05-18 MED ORDER — ACETAMINOPHEN 325 MG PO TABS: 650.0000 mg | ORAL_TABLET | Freq: Four times a day (QID) | ORAL | Status: DC | PRN

## 2022-05-18 MED ORDER — FAMOTIDINE 20 MG PO TABS: 40.0000 mg | ORAL_TABLET | Freq: Once | ORAL | Status: DC | PRN

## 2022-05-18 MED ORDER — MIDAZOLAM HCL 2 MG/2ML IJ SOLN
INTRAMUSCULAR | Status: AC
  Filled 2022-05-18: qty 2

## 2022-05-18 MED ORDER — FENTANYL CITRATE (PF) 100 MCG/2ML IJ SOLN
INTRAMUSCULAR | Status: DC | PRN
  Administered 2022-05-18 (×2): 25 ug via INTRAVENOUS

## 2022-05-18 MED ORDER — IODIXANOL 320 MG/ML IV SOLN
INTRAVENOUS | Status: DC | PRN
  Administered 2022-05-18: 40 mL

## 2022-05-18 MED ORDER — HEPARIN SODIUM (PORCINE) 1000 UNIT/ML IJ SOLN
INTRAMUSCULAR | Status: AC
  Filled 2022-05-18: qty 10

## 2022-05-18 MED ORDER — ONDANSETRON HCL 4 MG/2ML IJ SOLN: 4.0000 mg | Freq: Four times a day (QID) | INTRAMUSCULAR | Status: DC | PRN

## 2022-05-18 MED ORDER — ACETAMINOPHEN 650 MG RE SUPP: 650.0000 mg | Freq: Four times a day (QID) | RECTAL | Status: DC | PRN

## 2022-05-18 MED ORDER — HEPARIN SODIUM (PORCINE) 1000 UNIT/ML IJ SOLN
INTRAMUSCULAR | Status: DC | PRN
  Administered 2022-05-18: 3000 [IU] via INTRAVENOUS

## 2022-05-18 MED ORDER — INSULIN ASPART 100 UNIT/ML IJ SOLN
0.0000 [IU] | INTRAMUSCULAR | Status: DC
  Administered 2022-05-18: 3 [IU] via SUBCUTANEOUS
  Administered 2022-05-18: 2 [IU] via SUBCUTANEOUS
  Administered 2022-05-19: 3 [IU] via SUBCUTANEOUS
  Administered 2022-05-19: 2 [IU] via SUBCUTANEOUS
  Filled 2022-05-18 (×4): qty 1

## 2022-05-18 MED ORDER — SENNOSIDES-DOCUSATE SODIUM 8.6-50 MG PO TABS: 1.0000 | ORAL_TABLET | Freq: Every evening | ORAL | Status: DC | PRN

## 2022-05-18 MED ORDER — CEFAZOLIN SODIUM-DEXTROSE 2-4 GM/100ML-% IV SOLN: 2.0000 g | INTRAVENOUS | Status: AC

## 2022-05-18 MED ORDER — IPRATROPIUM-ALBUTEROL 0.5-2.5 (3) MG/3ML IN SOLN
3.0000 mL | Freq: Four times a day (QID) | RESPIRATORY_TRACT | Status: DC
  Administered 2022-05-18 – 2022-05-19 (×4): 3 mL via RESPIRATORY_TRACT
  Filled 2022-05-18 (×4): qty 3

## 2022-05-18 MED ORDER — FENTANYL CITRATE PF 50 MCG/ML IJ SOSY
PREFILLED_SYRINGE | INTRAMUSCULAR | Status: AC
  Filled 2022-05-18: qty 1

## 2022-05-18 MED ORDER — ONDANSETRON HCL 4 MG PO TABS: 4.0000 mg | ORAL_TABLET | Freq: Four times a day (QID) | ORAL | Status: DC | PRN

## 2022-05-18 MED ORDER — OXYCODONE HCL 5 MG PO TABS: 5.0000 mg | ORAL_TABLET | ORAL | Status: DC | PRN

## 2022-05-18 MED ORDER — SODIUM CHLORIDE 0.9 % IV SOLN: INTRAVENOUS | Status: DC

## 2022-05-18 MED ORDER — CEFAZOLIN SODIUM-DEXTROSE 2-4 GM/100ML-% IV SOLN
INTRAVENOUS | Status: AC
  Administered 2022-05-18: 2 g via INTRAVENOUS
  Filled 2022-05-18: qty 100

## 2022-05-18 MED ORDER — MIDAZOLAM HCL 2 MG/ML PO SYRP: 8.0000 mg | ORAL_SOLUTION | Freq: Once | ORAL | Status: DC | PRN

## 2022-05-18 MED ORDER — HYDRALAZINE HCL 20 MG/ML IJ SOLN: 10.0000 mg | INTRAMUSCULAR | Status: DC | PRN

## 2022-05-18 MED ORDER — MORPHINE SULFATE (PF) 2 MG/ML IV SOLN: 2.0000 mg | INTRAVENOUS | Status: DC | PRN

## 2022-05-18 MED ORDER — CARVEDILOL 25 MG PO TABS
25.0000 mg | ORAL_TABLET | Freq: Two times a day (BID) | ORAL | Status: DC
  Administered 2022-05-18 – 2022-05-21 (×8): 25 mg via ORAL
  Filled 2022-05-18 (×6): qty 1
  Filled 2022-05-18: qty 4
  Filled 2022-05-18: qty 1

## 2022-05-18 MED ORDER — GUAIFENESIN 100 MG/5ML PO LIQD: 5.0000 mL | ORAL | Status: DC | PRN

## 2022-05-18 MED ORDER — IOHEXOL 350 MG/ML SOLN
125.0000 mL | Freq: Once | INTRAVENOUS | Status: AC | PRN
  Administered 2022-05-18: 125 mL via INTRAVENOUS

## 2022-05-18 MED ORDER — MIDAZOLAM HCL 2 MG/2ML IJ SOLN
INTRAMUSCULAR | Status: DC | PRN
  Administered 2022-05-18 (×2): 1 mg via INTRAVENOUS

## 2022-05-18 MED ORDER — PANTOPRAZOLE SODIUM 40 MG PO TBEC
40.0000 mg | DELAYED_RELEASE_TABLET | Freq: Every day | ORAL | Status: DC
  Administered 2022-05-18 – 2022-05-21 (×4): 40 mg via ORAL
  Filled 2022-05-18 (×4): qty 1

## 2022-05-18 MED ORDER — SODIUM CHLORIDE 0.9 % IV BOLUS (SEPSIS)
500.0000 mL | Freq: Once | INTRAVENOUS | Status: AC
  Administered 2022-05-18: 500 mL via INTRAVENOUS

## 2022-05-18 MED ORDER — TRAZODONE HCL 50 MG PO TABS: 50.0000 mg | ORAL_TABLET | Freq: Every evening | ORAL | Status: DC | PRN

## 2022-05-18 MED ORDER — HEPARIN (PORCINE) 25000 UT/250ML-% IV SOLN
1000.0000 [IU]/h | INTRAVENOUS | Status: DC
  Administered 2022-05-18: 1000 [IU]/h via INTRAVENOUS
  Filled 2022-05-18: qty 250

## 2022-05-18 MED ORDER — HYDROCODONE-ACETAMINOPHEN 5-325 MG PO TABS: 1.0000 | ORAL_TABLET | ORAL | Status: DC | PRN

## 2022-05-18 MED ORDER — METHYLPREDNISOLONE SODIUM SUCC 125 MG IJ SOLR: 125.0000 mg | Freq: Once | INTRAMUSCULAR | Status: DC | PRN

## 2022-05-18 MED ORDER — HEPARIN (PORCINE) 25000 UT/250ML-% IV SOLN: 800.0000 [IU]/h | INTRAVENOUS | Status: DC

## 2022-05-18 MED ORDER — HYDRALAZINE HCL 20 MG/ML IJ SOLN: 5.0000 mg | INTRAMUSCULAR | Status: DC | PRN

## 2022-05-18 MED ORDER — CHLORHEXIDINE GLUCONATE CLOTH 2 % EX PADS
6.0000 | MEDICATED_PAD | Freq: Once | CUTANEOUS | Status: DC
  Filled 2022-05-18: qty 6

## 2022-05-18 MED ORDER — IPRATROPIUM-ALBUTEROL 0.5-2.5 (3) MG/3ML IN SOLN: 3.0000 mL | RESPIRATORY_TRACT | Status: DC | PRN

## 2022-05-18 MED ORDER — HYDROMORPHONE HCL 1 MG/ML IJ SOLN: 1.0000 mg | Freq: Once | INTRAMUSCULAR | Status: DC | PRN

## 2022-05-18 SURGICAL SUPPLY — 19 items
BALLN ULTRVRSE 10X60X75 (BALLOONS) ×1 IMPLANT
BALLOON ULTRVRSE 10X60X75 (BALLOONS) IMPLANT
CANISTER PENUMBRA ENGINE (MISCELLANEOUS) IMPLANT
CANNULA 5F STIFF (CANNULA) IMPLANT
CATH LIGHTNI FLASH 16XTORQ 100 (CATHETERS) IMPLANT
CATH LIGHTNING FLASH XTORQ 100 (CATHETERS) ×1
CLOSURE PERCLOSE PROSTYLE (VASCULAR PRODUCTS) IMPLANT
DEVICE SAFEGUARD 24CM (GAUZE/BANDAGES/DRESSINGS) IMPLANT
DRAPE C-ARM 35X43 STRL (DRAPES) IMPLANT
DRYSEAL FLEXSHEATH 16FR 33CM (SHEATH) ×1
GLIDEWIRE ADV .035X260CM (WIRE) IMPLANT
KIT ENCORE 26 ADVANTAGE (KITS) IMPLANT
PACK ANGIOGRAPHY (CUSTOM PROCEDURE TRAY) ×1 IMPLANT
SHEATH BRITE TIP 6FRX11 (SHEATH) IMPLANT
SHEATH BRITE TIP 6FRX5.5 (SHEATH) IMPLANT
SHEATH BRITE TIP 8FRX11 (SHEATH) IMPLANT
SHEATH DRYSEAL FLEX 16FR 33CM (SHEATH) IMPLANT
SUT MNCRL AB 4-0 PS2 18 (SUTURE) IMPLANT
WIRE GUIDERIGHT .035X150 (WIRE) IMPLANT

## 2022-05-18 NOTE — Assessment & Plan Note (Signed)
History of provoked DVT left lower extremity 04/2022 following hip fracture repair History of IVC filter placement in 2006 Continue heparin infusion Vascular consult Keep leg elevated
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
## 2022-05-18 NOTE — ED Notes (Signed)
Pt given saltine crackers to take with her medication.

## 2022-05-18 NOTE — ED Notes (Signed)
MD Reesa Chew notified of critical APTT >200
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
## 2022-05-18 NOTE — Interval H&P Note (Signed)
History and Physical Interval Note:  05/18/2022 4:26 PM  Brandy Gonzales  has presented today for surgery, with the diagnosis of DVT.  The various methods of treatment have been discussed with the patient and family. After consideration of risks, benefits and other options for treatment, the patient has consented to  Procedure(s): PERIPHERAL VASCULAR THROMBECTOMY (Left) as a surgical intervention.  The patient's history has been reviewed, patient examined, no change in status, stable for surgery.  I have reviewed the patient's chart and labs.  Questions were answered to the patient's satisfaction.     Leotis Pain

## 2022-05-18 NOTE — Assessment & Plan Note (Signed)
BP elevated at 174/77 Continue home carvedilol Hydralazine as needed
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
## 2022-05-18 NOTE — Assessment & Plan Note (Signed)
Blood sugar 268 Sliding scale insulin coverage

## 2022-05-18 NOTE — Op Note (Signed)
 VEIN AND VASCULAR SURGERY   OPERATIVE NOTE   PRE-OPERATIVE DIAGNOSIS: extensive left ileofemoral DVT, May Thurner syndrome  POST-OPERATIVE DIAGNOSIS: same   PROCEDURE: 1.   US guidance for vascular access to left femoral vein 2.   IVC gram and left lower extremity venogram 3.   Mechanical thrombectomy to left iliac vein with the penumbra 16 flash/lightening device 4.   PTA of the left common iliac vein with 10 mm balloon    SURGEON: Leotis Pain, MD  ASSISTANT(S): none  ANESTHESIA: local with moderate conscious sedation for 39 minutes using 2 mg of Versed and 50 mcg of Fentanyl  ESTIMATED BLOOD LOSS: 150 cc  FINDING(S): 1.  May Thurner syndrome with thrombosis of the left common iliac vein secondary to a high-grade stenosis  SPECIMEN(S):  none  INDICATIONS:    Patient is a 82 y.o. female who presents with left lower extremity DVT and a CT scan suggesting May-Thurner syndrome with iliac vein DVT.  Patient has marked leg swelling and pain.  Venous intervention is performed to reduce the symtpoms and avoid long term postphlebitic symptoms.    DESCRIPTION: After obtaining full informed written consent, the patient was brought back to the vascular suite and placed supine upon the table. Moderate conscious sedation was administered during a face to face encounter with the patient throughout the procedure with my supervision of the RN administering medicines and monitoring the patient's vital signs, pulse oximetry, telemetry and mental status throughout from the start of the procedure until the patient was taken to the recovery room.  After obtaining adequate anesthesia, the patient was prepped and draped in the standard fashion.    The patient was then placed into the prone position.  The left femoral was then accessed under direct ultrasound guidance without difficulty with a micropuncture needle and a permanent image was recorded.  This was done just distal to the common femoral  vein under ultrasound guidance intentionally to allow treatment.  A 6 French sheath was placed and then a ProGlide device was placed.  I then upsized to an 16 Fr sheath over a J wire.  3000 units of heparin were then given.  Imaging showed extensive DVT in the left common iliac vein which was occlusive.  The left hypogastric vein was patent and provided collateral blood flow with patency of the left external iliac vein on venogram.  The left common femoral vein and most proximal superficial femoral vein and deep femoral vein were both patent as well.  A Kumpe catheter and advantage tourque wire were used. I was able to cross the thrombus and stenosis and advance into the IVC which was patent.  I used the Penumbra Cat 16 lightening/flash catheter and evacuated about 150 cc of effluent with mechanical thrombectomy throughout the iliac veins.  This had marked improvement with essentially resolution of the thrombus and remaining 80 to 90% stenosis of the left common iliac vein consistent with May Thurner syndrome.  I then turned my attention to the iliac vein stenosis.  The stenosis was treated with a 6 cm long 10 mm diameter angioplasty balloon.  This demonstrated significant improvement with only about a 20% residual stenosis.  I then elected to terminate the procedure.  The sheath was removed and a dressing was placed.  She was taken to the recovery room in stable condition having tolerated the procedure well.    COMPLICATIONS: None  CONDITION: Stable  Leotis Pain 05/18/2022 5:11 PM

## 2022-05-18 NOTE — Assessment & Plan Note (Signed)
Renal function at baseline 
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
## 2022-05-19 ENCOUNTER — Encounter: Payer: Self-pay | Admitting: Vascular Surgery

## 2022-05-19 DIAGNOSIS — I82422 Acute embolism and thrombosis of left iliac vein: Secondary | ICD-10-CM | POA: Diagnosis not present

## 2022-05-19 LAB — CBC
HCT: 25 % — ABNORMAL LOW (ref 36.0–46.0)
Hemoglobin: 8.2 g/dL — ABNORMAL LOW (ref 12.0–15.0)
MCH: 29.8 pg (ref 26.0–34.0)
MCHC: 32.8 g/dL (ref 30.0–36.0)
MCV: 90.9 fL (ref 80.0–100.0)
Platelets: 212 10*3/uL (ref 150–400)
RBC: 2.75 MIL/uL — ABNORMAL LOW (ref 3.87–5.11)
RDW: 14.5 % (ref 11.5–15.5)
WBC: 6.3 10*3/uL (ref 4.0–10.5)
nRBC: 0 % (ref 0.0–0.2)

## 2022-05-19 LAB — APTT
aPTT: >200 seconds (ref 24–36)
aPTT: 125 seconds — ABNORMAL HIGH (ref 24–36)

## 2022-05-19 LAB — HEPARIN LEVEL (UNFRACTIONATED): Heparin Unfractionated: >1.1 IU/mL — ABNORMAL HIGH (ref 0.30–0.70)

## 2022-05-19 LAB — GLUCOSE, CAPILLARY
Glucose-Capillary: 154 mg/dL — ABNORMAL HIGH (ref 70–99)
Glucose-Capillary: 87 mg/dL (ref 70–99)
Glucose-Capillary: 201 mg/dL — ABNORMAL HIGH (ref 70–99)
Glucose-Capillary: 212 mg/dL — ABNORMAL HIGH (ref 70–99)
Glucose-Capillary: 135 mg/dL — ABNORMAL HIGH (ref 70–99)

## 2022-05-19 LAB — BASIC METABOLIC PANEL
Anion gap: 6 (ref 5–15)
BUN: 21 mg/dL (ref 8–23)
CO2: 22 mmol/L (ref 22–32)
Calcium: 8.7 mg/dL — ABNORMAL LOW (ref 8.9–10.3)
Chloride: 109 mmol/L (ref 98–111)
Creatinine, Ser: 1.5 mg/dL — ABNORMAL HIGH (ref 0.44–1.00)
GFR, Estimated: 35 mL/min — ABNORMAL LOW (ref >60)
Glucose, Bld: 191 mg/dL — ABNORMAL HIGH (ref 70–99)
Potassium: 4.2 mmol/L (ref 3.5–5.1)
Sodium: 137 mmol/L (ref 135–145)

## 2022-05-19 LAB — MAGNESIUM: Magnesium: 2 mg/dL (ref 1.7–2.4)

## 2022-05-19 MED ORDER — INSULIN ASPART 100 UNIT/ML IJ SOLN
0.0000 [IU] | Freq: Three times a day (TID) | INTRAMUSCULAR | Status: DC
  Administered 2022-05-19: 3 [IU] via SUBCUTANEOUS
  Administered 2022-05-19: 1 [IU] via SUBCUTANEOUS
  Administered 2022-05-20: 3 [IU] via SUBCUTANEOUS
  Administered 2022-05-20: 5 [IU] via SUBCUTANEOUS
  Administered 2022-05-21: 2 [IU] via SUBCUTANEOUS
  Administered 2022-05-21: 1 [IU] via SUBCUTANEOUS
  Administered 2022-05-21: 5 [IU] via SUBCUTANEOUS
  Filled 2022-05-19 (×6): qty 1

## 2022-05-19 MED ORDER — HEPARIN (PORCINE) 25000 UT/250ML-% IV SOLN
400.0000 [IU]/h | INTRAVENOUS | Status: DC
  Administered 2022-05-19: 500 [IU]/h via INTRAVENOUS
  Filled 2022-05-19: qty 250

## 2022-05-19 NOTE — Progress Notes (Incomplete)
Brandy Gonzales is a 82 YO AA F with a PMH of HTN, DM, CKD who presented to the ED from severe hip pain after a fall. CC: Brandy Gonzales received pain management and underwent PVT surgical intervention. Labs: BP: 119/69, Glucose: 191, Creatinine 1.5, GFR: 35, RBC: 2.75, Hgb: 8.2. Allergies: Amlodipine, Atenolol, Enalapril, Oxycodone, Propoxyphene N-acetaminophen. PTA: eliquis 5 mg 2 tab BID x 1 week, 1 t PO BID x 3 months, coreg 24, farxiga 10, amaryl 2  AC/Heme:  DVT - CT venogram showing acute left iliac vein thrombus - heparin gtt(apixaban PTA) ID:  Neuro/Pain: CV:  Pulm:  Endo:   T2DM - SSI GI/Nutrition: LBM 1/7 Hepatic:  Renal:  CKD - SCR 1.59(b/l near 1.1-1.2) Onc:   Assessment/Plan: Placed on heparin 3000 bolus, pharmacy decreased heparin drip to 600 U/hr. Waiting for aPTT to reassess dosing until HL and aPTT are therapeutic.   Goal of Therapy:  Heparin level 0.3-0.7 units/ml aPTT 66 - 102  seconds Monitor platelets by anticoagulation protocol: Yes   Date/time:       aPTT/HL:        Rate: 1/8@1202        >200s              SUPRAtherapeutic@1000units /hr 1/9 0154          aPTT > 200 (236 per lab tech)

## 2022-05-19 NOTE — Consult Note (Signed)
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 Aortic arch: Aortic atherosclerosis. Branching pattern is normal without origin stenosis. Right carotid system: Common carotid artery widely patent to the bifurcation. Calcified plaque at the carotid bifurcation and ICA bulb but no stenosis. Cervical ICA widely patent. Left carotid system: Common carotid artery widely patent to the bifurcation. No plaque at the bifurcation or ICA bulb. No ICA stenosis. Vertebral arteries: Both vertebral arteries widely patent at their origins and through the cervical region to the foramen magnum. Skeleton: Chronic cervical spondylosis. Other neck: No mass or lymphadenopathy. Upper chest: Normal Review of the MIP images confirms the above findings CTA HEAD FINDINGS Anterior circulation: Both internal carotid arteries are patent through the skull base and siphon regions. There is ordinary siphon atherosclerotic calcification but no  stenosis greater than 30-50%. The anterior and middle cerebral vessels are patent. No large vessel occlusion or proximal stenosis. No aneurysm or vascular malformation. Posterior circulation: Both vertebral arteries widely patent to the basilar. No basilar stenosis. Posterior circulation branch vessels are normal. Venous sinuses: Patent and normal. Anatomic variants: None significant. Review of the MIP images confirms the above findings IMPRESSION: 1. No large vessel occlusion or proximal stenosis. 2. Aortic atherosclerosis. 3. Atherosclerotic change at the right carotid bifurcation but no stenosis. 4. Atherosclerotic disease in both carotid siphon regions but without stenosis greater than 30-50% suspected. Aortic Atherosclerosis (ICD10-I70.0). Electronically Signed   By: Nelson Chimes M.D.   On: 04/27/2022 13:10   CT HEAD CODE STROKE WO CONTRAST  Result Date: 04/27/2022 CLINICAL DATA:  Code stroke. Neuro deficit, acute, stroke suspected. EXAM: CT HEAD WITHOUT CONTRAST TECHNIQUE: Contiguous axial images were obtained from the base of the skull through the vertex without intravenous contrast. RADIATION DOSE REDUCTION: This exam was performed according to the departmental dose-optimization program which includes automated exposure control, adjustment of the mA and/or kV according to patient size and/or use of iterative reconstruction technique. COMPARISON:  11/16/2021 FINDINGS: Brain: Generalized atrophy. No focal abnormality seen affecting the brainstem or cerebellum. Extensive chronic appearing small vessel ischemic changes of the cerebral hemispheric white matter. No sign of acute large vessel territory stroke. No mass lesion, hemorrhage, hydrocephalus or extra-axial collection. Vascular: There is atherosclerotic calcification of the major vessels at the base of the brain. Skull: Negative Sinuses/Orbits: Clear/normal Other: None ASPECTS ([place] Stroke Program Early CT Score) - Ganglionic level infarction  (caudate, lentiform nuclei, internal capsule, insula, M1-M3 cortex): 7 - Supraganglionic infarction (M4-M6 cortex): 3 Total score (0-10 with 10 being normal): 10 IMPRESSION: 1. No acute CT finding. Atrophy and extensive chronic small-vessel ischemic changes of the white matter. 2. Aspects is 10. These results were communicated to Dr. Lorrin Goodell at 12:54 pm on 04/27/2022 by text page via the [facility] messaging system. Electronically Signed   By: Nelson Chimes M.D.   On: 04/27/2022 12:55   DG FEMUR MIN 2 VIEWS LEFT  Result Date: 04/26/2022 CLINICAL DATA:  Left hip ORIF EXAM: LEFT FEMUR 2 VIEWS COMPARISON:  04/25/2022 FINDINGS: Interval postsurgical changes from ORIF of intertrochanteric left femur fracture with long intramedullary rod, proximal lag screw, and distal interlocking screws. Anatomic alignment at the fracture site. No new fractures. No malalignment. Expected postoperative changes within the overlying soft tissues. IMPRESSION: Interval ORIF of intertrochanteric left femur fracture. No postoperative complications are evident. Electronically Signed   By: Davina Poke D.O.   On: 04/26/2022 14:11   DG FEMUR MIN 2 VIEWS LEFT  Result Date: 04/26/2022 CLINICAL DATA:  Left hip  surgery EXAM: LEFT FEMUR 2 VIEWS COMPARISON:  04/25/2022 FINDINGS: Six C-arm fluoroscopic images were obtained intraoperatively and submitted for post operative interpretation. Images demonstrate placement of ORIF hardware traversing proximal left femur fracture with improved alignment. 105 seconds fluoroscopy time utilized. Radiation dose: 6.36 mGy. Please see the performing provider's procedural report for further detail. IMPRESSION: Intraoperative fluoroscopy provided for left hip ORIF. Electronically Signed   By: Davina Poke D.O.   On: 04/26/2022 13:31   DG C-Arm 1-60 Min-No Report  Result Date: 04/26/2022 Fluoroscopy was utilized by the requesting physician.  No radiographic interpretation.   DG Femur Min 2 Views  Left  Result Date: 04/25/2022 CLINICAL DATA:  Left hip fracture. EXAM: LEFT FEMUR 2 VIEWS COMPARISON:  Earlier same day hip radiograph at 1915 hours FINDINGS: Comminuted intertrochanteric fracture, better visualized on earlier same day radiograph secondary to overlying soft tissues. No other acute fracture of the left femur. Partially visualized intramedullary rod with proximal locking screws in the tibia. Small ossification noted adjacent to the medial femoral condyle, likely sequela from prior trauma. No suprapatellar knee joint effusion. Vascular calcifications are seen in the left thigh and left lower leg. IMPRESSION: Acute comminuted intertrochanteric fracture, better visualized on earlier same day hip radiograph due to overlying soft tissues. No other acute fractures of the left femur. Electronically Signed   By: Ileana Roup M.D.   On: 04/25/2022 20:38   DG Chest 1 View  Result Date: 04/25/2022 CLINICAL DATA:  Fall EXAM: CHEST  1 VIEW COMPARISON:  Chest x-ray 09/27/2021 FINDINGS: The heart size and mediastinal contours are within normal limits. Both lungs are clear. The visualized skeletal structures are unremarkable. IMPRESSION: No active disease. Electronically Signed   By: Ronney Asters M.D.   On: 04/25/2022 19:49   DG Hip Unilat W or Wo Pelvis 2-3 Views Left  Result Date: 04/25/2022 CLINICAL DATA:  Hip pain, fall. EXAM: DG HIP (WITH OR WITHOUT PELVIS) 2-3V LEFT COMPARISON:  None. FINDINGS: There is a comminuted left femoral intratrochanteric fracture with apex lateral angulation and overlying soft tissue swelling. There is no dislocation identified. IMPRESSION: Comminuted left femoral intratrochanteric fracture. Electronically Signed   By: Ronney Asters M.D.   On: 04/25/2022 19:44    ASSESSMENT: Worsening DVT despite treatment with Eliquis.  PLAN:    Worsening DVT despite treatment with Eliquis: Patient's initial DVT was secondary to fracture of left hip and immobility.  Although  patient was on Eliquis at the time of her worsening blood clot, is unclear if patient truly missed dosing or not.  Either way, recommend transitioning patient to Xarelto 20 mg daily upon discharge.  Will arrange follow-up in the cancer center 3 to 4 weeks after discharge to assess toleration and compliance of medication.  Appreciate consult, will follow.  Lloyd Huger, MD   05/19/2022 2:27 PM

## 2022-05-19 NOTE — Progress Notes (Signed)
Progress Note    05/19/2022 11:29 AM 1 Day Post-Op  Subjective:  This is a 82 y.o. female Brandy Gonzales is a 82 y.o. female with medical history significant for HTN, DM2, CKD stage IIIb, protein calorie malnutrition recently hospitalized from 12/16 to 12/27 with left hip fracture complicated by acute extensive DVT left lower extremity s/p IVC filter, discharged on Eliquis, currently in rehab [facility], needs was brought in by EMS with worsening swelling of the left lower extremity.    Procedure: Status post IVC gram with left lower extremity venogram and mechanical thrombectomy and angioplasty to left iliac vein.  Upon exam this morning patient is resting comfortably in bed with daughter at the bedside.  Patient states that she feels much better and a lot less pain to her left lower extremity.  This morning's left lower extremity according to the daughter is less swollen than it was before she came to the hospital.  She remains on a heparin drip this morning.  Left groin puncture site is clean dry and intact no signs or symptoms of hematoma seroma or infection.  No complications overnight vitals remained stable.     Vitals:   05/19/22 1100 05/19/22 1113  BP:  118/62  Pulse:  83  Resp: 18 18  Temp:  98.6 F (37 C)  SpO2:  100%   Physical Exam: Cardiac:  RRR Lungs: Clear throughout. Incisions: Groin puncture site clean dry and intact no signs or symptoms of infection no hematoma seroma noted Extremities: Lower extremity is less swollen this morning.  Warm to the touch.  Positive posttibial pulse. Abdomen: Bowel sounds all 4 quadrants, soft, nontender, nondistended, Neurologic: AAO x 3 person place and time.  CBC    Component Value Date/Time   WBC 6.3 05/19/2022 0154   RBC 2.75 (L) 05/19/2022 0154   HGB 8.2 (L) 05/19/2022 0154   HCT 25.0 (L) 05/19/2022 0154   PLT 212 05/19/2022 0154   MCV 90.9 05/19/2022 0154   MCH 29.8 05/19/2022 0154   MCHC 32.8 05/19/2022 0154   RDW 14.5  05/19/2022 0154   LYMPHSABS 1.7 05/17/2022 2010   MONOABS 0.7 05/17/2022 2010   EOSABS 0.2 05/17/2022 2010   BASOSABS 0.0 05/17/2022 2010    BMET    Component Value Date/Time   NA 137 05/19/2022 0154   K 4.2 05/19/2022 0154   CL 109 05/19/2022 0154   CO2 22 05/19/2022 0154   GLUCOSE 191 (H) 05/19/2022 0154   BUN 21 05/19/2022 0154   CREATININE 1.50 (H) 05/19/2022 0154   CALCIUM 8.7 (L) 05/19/2022 0154   GFRNONAA 35 (L) 05/19/2022 0154   GFRAA 33 (L) 05/03/2019 1638    INR    Component Value Date/Time   INR 1.6 (H) 05/18/2022 0335     Intake/Output Summary (Last 24 hours) at 05/19/2022 1129 Last data filed at 05/19/2022 1104 Gross per 24 hour  Intake 112.4 ml  Output 600 ml  Net -487.6 ml     Assessment/Plan:  82 y.o. female is s/p IVC gram with left lower extremity venogram and mechanical thrombectomy and angioplasty to left iliac vein. 1 Day Post-Op  On assessment this morning patient is resting comfortably in bed with decreased pain and decreased swelling to the left lower extremity.  Patient is recovering as expected.  Patient remains on heparin drip this morning.  Recommend patient be transition to p.o. anticoagulation.  She has been on Eliquis for over a month post known DVT from prior hip fracture  fixation.  And a long discussion with daughter this morning she is against using Eliquis.  She states that her mother has not benefited from this.  Informed her Eliquis would be the correct selection for anticoagulation due to the patient's age and risk for bleeding.  She would like to discuss this with the managing team.  Patient is noted to be in [facility] rehab post hip surgery.  Patient has not been ambulating properly due to the prior DVT.  She will need extensive rehab therapy.  Patient needs to walk is much as she can now the DVT has been removed. This was discussed with both the patient and daughter at the bedside.  Daughter agrees with the plan.   DVT prophylaxis:   Heparin Drip   Drema Pry [facility] 05/19/2022 11:29 AM
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
## 2022-05-19 NOTE — Plan of Care (Signed)
Patient remains in Cardiac PCU. No supplemental O2. Heparin infusion via PIV. PAD has been removed from left femoral site.   Problem: Education: Goal: Verbalization of understanding the information provided (i.e., activity precautions, restrictions, etc) will improve Outcome: Progressing Goal: Individualized Educational Video(s) Outcome: Progressing   Problem: Activity: Goal: Ability to ambulate and perform ADLs will improve Outcome: Progressing   Problem: Clinical Measurements: Goal: Postoperative complications will be avoided or minimized Outcome: Progressing   Problem: Self-Concept: Goal: Ability to maintain and perform role responsibilities to the fullest extent possible will improve Outcome: Progressing   Problem: Pain Management: Goal: Pain level will decrease Outcome: Progressing   Problem: Education: Goal: Knowledge of General Education information will improve Description: Including pain rating scale, medication(s)/side effects and non-pharmacologic comfort measures Outcome: Progressing   Problem: Health Behavior/Discharge Planning: Goal: Ability to manage health-related needs will improve Outcome: Progressing   Problem: Clinical Measurements: Goal: Ability to maintain clinical measurements within normal limits will improve Outcome: Progressing Goal: Will remain free from infection Outcome: Progressing Goal: Diagnostic test results will improve Outcome: Progressing Goal: Respiratory complications will improve Outcome: Progressing Goal: Cardiovascular complication will be avoided Outcome: Progressing   Problem: Activity: Goal: Risk for activity intolerance will decrease Outcome: Progressing   Problem: Nutrition: Goal: Adequate nutrition will be maintained Outcome: Progressing   Problem: Coping: Goal: Level of anxiety will decrease Outcome: Progressing   Problem: Elimination: Goal: Will not experience complications related to bowel motility Outcome:  Progressing Goal: Will not experience complications related to urinary retention Outcome: Progressing   Problem: Pain Managment: Goal: General experience of comfort will improve Outcome: Progressing   Problem: Safety: Goal: Ability to remain free from injury will improve Outcome: Progressing   Problem: Skin Integrity: Goal: Risk for impaired skin integrity will decrease Outcome: Progressing

## 2022-05-19 NOTE — TOC Progression Note (Signed)
Transition of Care [facility]) - Progression Note    Patient Details  Name: Brandy Gonzales MRN: [id number] Date of Birth: 06-16-1940  [facility]) CM/SW Contact  Laurena Slimmer, RN Phone Number: 05/19/2022, 1:35 PM  Clinical Narrative:    Attempt to speak with patient about her discharge plan. She request to speak at a later time as she was eating her meal.         Expected Discharge Plan and Services                                               Social Determinants of Health (SDOH) Interventions SDOH Screenings   Food Insecurity: No Food Insecurity (04/25/2022)  Housing: Low Risk  (04/25/2022)  Transportation Needs: No Transportation Needs (04/25/2022)  Utilities: Not At Risk (04/25/2022)  Alcohol Screen: Low Risk  (03/24/2021)  Depression (PHQ2-9): Medium Risk (01/21/2022)  Financial Resource Strain: Low Risk  (03/24/2021)  Physical Activity: Inactive (03/24/2021)  Social Connections: Moderately Isolated (03/24/2021)  Stress: No Stress Concern Present (03/24/2021)  Tobacco Use: Low Risk  (05/19/2022)    Readmission Risk Interventions     No data to display

## 2022-05-19 NOTE — Progress Notes (Signed)
Inpatient Diabetes Program Recommendations  AACE/ADA: New Consensus Statement on Inpatient Glycemic Control (2015)  Target Ranges:  Prepandial:   less than 140 mg/dL      Peak postprandial:   less than 180 mg/dL (1-2 hours)      Critically ill patients:  140 - 180 mg/dL   Lab Results  Component Value Date   GLUCAP 201 (H) 05/19/2022   HGBA1C 7.8 (H) 04/22/2022    Review of Glycemic Control  Latest Reference Range & Units 05/18/22 22:33 05/19/22 03:29 05/19/22 07:46 05/19/22 11:14  Glucose-Capillary 70 - 99 mg/dL 249 (H) 154 (H) 87 201 (H)   Diabetes history: DM 2 Outpatient Diabetes medications:  Farxiga 10 mg daily, Amaryl 2 mg daily Current orders for Inpatient glycemic control:  Novolog 0-9 units q 4 hours  Inpatient Diabetes Program Recommendations:    Consider changing Novolog correction to tid with meals and HS (instead of q 4 hours).   Thanks  Adah Perl, RN, BC-ADM Inpatient Diabetes Coordinator Pager [phone]  (8a-5p)
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
## 2022-05-20 ENCOUNTER — Other Ambulatory Visit (HOSPITAL_COMMUNITY): Payer: Self-pay

## 2022-05-20 DIAGNOSIS — I82422 Acute embolism and thrombosis of left iliac vein: Secondary | ICD-10-CM | POA: Diagnosis not present

## 2022-05-20 LAB — PREPARE RBC (CROSSMATCH)

## 2022-05-20 LAB — BASIC METABOLIC PANEL
Anion gap: 5 (ref 5–15)
BUN: 22 mg/dL (ref 8–23)
CO2: 24 mmol/L (ref 22–32)
Calcium: 8.8 mg/dL — ABNORMAL LOW (ref 8.9–10.3)
Chloride: 111 mmol/L (ref 98–111)
Creatinine, Ser: 1.61 mg/dL — ABNORMAL HIGH (ref 0.44–1.00)
GFR, Estimated: 32 mL/min — ABNORMAL LOW (ref >60)
Glucose, Bld: 150 mg/dL — ABNORMAL HIGH (ref 70–99)
Potassium: 4.4 mmol/L (ref 3.5–5.1)
Sodium: 140 mmol/L (ref 135–145)

## 2022-05-20 LAB — MAGNESIUM: Magnesium: 2.1 mg/dL (ref 1.7–2.4)

## 2022-05-20 LAB — CBC
HCT: 24.3 % — ABNORMAL LOW (ref 36.0–46.0)
Hemoglobin: 7.9 g/dL — ABNORMAL LOW (ref 12.0–15.0)
MCH: 29.6 pg (ref 26.0–34.0)
MCHC: 32.5 g/dL (ref 30.0–36.0)
MCV: 91 fL (ref 80.0–100.0)
Platelets: 217 10*3/uL (ref 150–400)
RBC: 2.67 MIL/uL — ABNORMAL LOW (ref 3.87–5.11)
RDW: 14.7 % (ref 11.5–15.5)
WBC: 5.4 10*3/uL (ref 4.0–10.5)
nRBC: 0 % (ref 0.0–0.2)

## 2022-05-20 LAB — APTT
aPTT: 107 seconds — ABNORMAL HIGH (ref 24–36)
aPTT: 72 seconds — ABNORMAL HIGH (ref 24–36)
aPTT: 88 seconds — ABNORMAL HIGH (ref 24–36)

## 2022-05-20 LAB — GLUCOSE, CAPILLARY
Glucose-Capillary: 131 mg/dL — ABNORMAL HIGH (ref 70–99)
Glucose-Capillary: 253 mg/dL — ABNORMAL HIGH (ref 70–99)
Glucose-Capillary: 155 mg/dL — ABNORMAL HIGH (ref 70–99)
Glucose-Capillary: 221 mg/dL — ABNORMAL HIGH (ref 70–99)

## 2022-05-20 LAB — HEPARIN LEVEL (UNFRACTIONATED): Heparin Unfractionated: >1.1 IU/mL — ABNORMAL HIGH (ref 0.30–0.70)

## 2022-05-20 MED ORDER — RIVAROXABAN 20 MG PO TABS
20.0000 mg | ORAL_TABLET | Freq: Every day | ORAL | Status: DC
  Administered 2022-05-20 – 2022-05-21 (×2): 20 mg via ORAL
  Filled 2022-05-20 (×2): qty 1

## 2022-05-20 MED ORDER — SODIUM CHLORIDE 0.9% IV SOLUTION: Freq: Once | INTRAVENOUS | Status: DC
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
## 2022-05-20 NOTE — TOC Initial Note (Addendum)
Transition of Care [facility]) - Initial/Assessment Note    Patient Details  Name: Brandy Gonzales MRN: [id number] Date of Birth: May 25, 1940  Transition of Care [facility]) CM/SW Contact:    Laurena Slimmer, RN Phone Number: 05/20/2022, 4:31 PM  Clinical Narrative:                  Spoke with patient and daughter at bedside. Patient is from [facility].  Call placed to [facility]. Per Magda Paganini patient was ST can return.        Patient Goals and CMS Choice            Expected Discharge Plan and Services                                              Prior Living Arrangements/Services                       Activities of Daily Living Home Assistive Devices/Equipment: Wheelchair ADL Screening (condition at time of admission) Patient's cognitive ability adequate to safely complete daily activities?: Yes Is the patient deaf or have difficulty hearing?: No Does the patient have difficulty seeing, even when wearing glasses/contacts?: No Does the patient have difficulty concentrating, remembering, or making decisions?: Yes Patient able to express need for assistance with ADLs?: Yes Does the patient have difficulty dressing or bathing?: Yes Independently performs ADLs?: No Communication: Independent Dressing (OT): Needs assistance Is this a change from baseline?: Pre-admission baseline Grooming: Needs assistance Is this a change from baseline?: Pre-admission baseline Feeding: Independent Bathing: Needs assistance Is this a change from baseline?: Pre-admission baseline Toileting: Needs assistance Is this a change from baseline?: Pre-admission baseline In/Out Bed: Needs assistance Is this a change from baseline?: Pre-admission baseline Walks in Home: Needs assistance Is this a change from baseline?: Pre-admission baseline Does the patient have difficulty walking or climbing stairs?: Yes Weakness of Legs: Left Weakness of Arms/Hands: None  Permission  Sought/Granted                  Emotional Assessment              Admission diagnosis:  Thrombosis [I82.90] Left leg swelling [M79.89] Acute deep vein thrombosis of left iliac vein (HCC) [I82.422] Patient Active Problem List   Diagnosis Date Noted   Acute deep vein thrombosis of left iliac vein (HCC) 05/18/2022   Current long-term use of anticoagulant medication with history of deep venous thrombosis 04/2022 (DVT) 05/18/2022   History of insertion of IVC (inferior vena caval) filter 2006 05/18/2022   Left leg swelling 05/18/2022   DVT (deep venous thrombosis) ([place]) 05/05/2022   Influenza A with pneumonia 05/02/2022   Protein-calorie malnutrition, severe 04/28/2022   Closed left hip fracture, initial encounter ([place]) 04/25/2022   Memory loss 12/10/2021   Bell palsy 12/10/2021   Acute bronchitis 10/21/2021   Dyslipidemia 04/11/2020   Abdominal enlargement 04/05/2018   Stage 3b chronic kidney disease (CKD) ([place]) 11/30/2017   Leg wound, right 08/08/2015   Gastroenteritis, acute 04/22/2015   OAB (overactive bladder) 04/22/2015   Leg wound, left 11/01/2014   Edema 11/01/2014   Seborrheic dermatitis 09/26/2013   Dysphagia, unspecified(787.20) 01/23/2013   Syncope 10/03/2012   Well adult exam 09/22/2012   Weight gain 09/22/2012   Cystitis 09/22/2012   Nonspecific abnormal electrocardiogram (ECG) (EKG) 09/22/2012  Osteoarthritis 05/19/2012   Leg pain, bilateral 01/13/2012   Chest pain 09/10/2011   Glaucoma 09/10/2011   Dysuria 07/17/2009   Diabetic nephropathy ([place]) 06/27/2008   Uncontrolled type 2 diabetes mellitus with hyperglycemia, without long-term current use of insulin ([place]) 04/27/2007   Essential hypertension 04/27/2007   GERD 04/27/2007   PCP:  Cassandria Anger, MD Pharmacy:   [facility] [place] [place] [address] Phone: [phone] Fax:  [phone]     Social Determinants of Health (SDOH) Social History: [place]: No Food Insecurity (04/25/2022)  Housing: Low Risk  (04/25/2022)  Transportation Needs: No Transportation Needs (04/25/2022)  Utilities: Not At Risk (04/25/2022)  Alcohol Screen: Low Risk  (03/24/2021)  Depression (PHQ2-9): Medium Risk (01/21/2022)  Financial Resource Strain: Low Risk  (03/24/2021)  Physical Activity: Inactive (03/24/2021)  Social Connections: Moderately Isolated (03/24/2021)  Stress: No Stress Concern Present (03/24/2021)  Tobacco Use: Low Risk  (05/19/2022)   SDOH Interventions:     Readmission Risk Interventions     No data to display

## 2022-05-20 NOTE — Progress Notes (Addendum)
Mobility Specialist - Progress Note   05/20/22 1000  Mobility  Activity Transferred from bed to chair  Level of Assistance Moderate assist, patient does 50-74%  Assistive Device Front wheel walker  Distance Ambulated (ft) 2 ft  Activity Response Tolerated well  $Mobility charge 1 Mobility     Pt lying in bed utilizing RA. Pt reports not being ambulatory, but also states "I was using a walker I guess" when asked how she was getting around PTA. Pain in LLE and sensitive to light touch. Pt able to complete bed mobility with minA on LLE. Good sitting balance. Pt STS and SPT with modA +1. VC for hand placement and weight-bearing to offset pain. Extensive effort to progress LLE forward. RPE 8/10. Pt left in recliner with alarm set, needs in reach.    Kathee Delton Mobility Specialist 05/20/22, 10:17 AM
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
## 2022-05-20 NOTE — TOC Benefit Eligibility Note (Signed)
Patient Teacher, English as a foreign language completed.    The patient is currently admitted and upon discharge could be taking Xarelto 20 mg.  The current 30 day co-pay is $11.20.   The patient is insured through [employer], [place] Patient Advocate Specialist [place] Patient Advocate Team Direct Number: [phone]  Fax: [phone]

## 2022-05-20 NOTE — Consult Note (Addendum)
ANTICOAGULATION CONSULT NOTE - Follow Up Consult  Pharmacy Consult for Heparin Indication: DVT  Allergies  Allergen Reactions   Amlodipine Besylate     REACTION: hair loss   Atenolol     REACTION: fluid retention   Enalapril Maleate     REACTION: palpitations   Oxycodone Hcl     REACTION: dizzy   Propoxyphene N-Acetaminophen Other (See Comments)    Unknown reaction    Patient Measurements: Height: 4\' 11"  (149.9 cm) Weight: 58.1 kg (128 lb 1.4 oz) IBW/kg (Calculated) : 43.2 Heparin Dosing Weight: 58.1 kg  Vital Signs: Temp: 98.5 F (36.9 C) (01/10 0733) Temp Source: Oral (01/10 0400) BP: 141/71 (01/10 0733) Pulse Rate: 76 (01/10 0733)  Labs: Recent Labs    05/17/22 2010 05/18/22 0335 05/18/22 1202 05/19/22 0154 05/19/22 1430 05/20/22 0018 05/20/22 0940  HGB 9.2*  --   --  8.2*  --  7.9*  --   HCT 28.8*  --   --  25.0*  --  24.3*  --   PLT 269  --   --  212  --  217  --   APTT  --  51*   < > >200* 125* 107* 72*  LABPROT  --  18.5*  --   --   --   --   --   INR  --  1.6*  --   --   --   --   --   HEPARINUNFRC  --  >1.10*  --  >1.10*  --  >1.10*  --   CREATININE 1.59*  --   --  1.50*  --  1.61*  --    < > = values in this interval not displayed.    Estimated Creatinine Clearance: 20.9 mL/min (A) (by C-G formula based on SCr of 1.61 mg/dL (H)).   Medications:  Scheduled:   carvedilol  25 mg Oral BID WC   insulin aspart  0-9 Units Subcutaneous TID AC & HS   pantoprazole  40 mg Oral Daily   Infusions:   heparin 400 Units/hr (05/20/22 0400)   PRN: acetaminophen **OR** acetaminophen, fentaNYL (SUBLIMAZE) injection, guaiFENesin, hydrALAZINE, HYDROcodone-acetaminophen, HYDROmorphone (DILAUDID) injection, ipratropium-albuterol, metoprolol tartrate, morphine injection, ondansetron (ZOFRAN) IV, ondansetron **OR** [DISCONTINUED] ondansetron (ZOFRAN) IV, oxyCODONE, senna-docusate, traZODone Anti-infectives (From admission, onward)    Start     Dose/Rate Route  Frequency Ordered Stop   05/18/22 1429  ceFAZolin (ANCEF) IVPB 2g/100 mL premix        2 g 200 mL/hr over 30 Minutes Intravenous 30 min pre-op 05/18/22 1429 05/18/22 1658       Assessment: MS is a 82 YO F with a PMH of HTN, DM, CKD, who presented to the ED from severe hip pain after a fall. MS received heparin 01/08 after PVT surgical intervention 05/18/22. Pt was taking eliquis 2.5 mg BID PTA. Onc/heme saw patient and unsure whether this is apixaban failed or if patient had missed doses. MD is recommended to start rivaroxaban at discharge.   Goal of Therapy:  Heparin level 0.3-0.7 units/ml once aPTT and heparin level correlate.  aPTT 66-102 seconds Monitor platelets by anticoagulation protocol: Yes  1/10 0018 HL = >1.10 aPTT 107 1/10 0940 aPTT 72.    Plan:  aPTT is at goal. Will continue heparin infusion at 400 units/hr. Recheck aPTT in 8 hours. Check heparin level and CBC with AM labs. Once heparin level and aPTT correlate can switch to heparin level monitoring. At discharge oncology is recommending to start  rivaroxaban 20 mg daily.     Mirna Mires, Pharmacy Student.  05/20/2022,10:30 AM

## 2022-05-21 DIAGNOSIS — Z7984 Long term (current) use of oral hypoglycemic drugs: Secondary | ICD-10-CM | POA: Diagnosis not present

## 2022-05-21 DIAGNOSIS — M6281 Muscle weakness (generalized): Secondary | ICD-10-CM | POA: Diagnosis not present

## 2022-05-21 DIAGNOSIS — E1122 Type 2 diabetes mellitus with diabetic chronic kidney disease: Secondary | ICD-10-CM | POA: Diagnosis not present

## 2022-05-21 DIAGNOSIS — S72142D Displaced intertrochanteric fracture of left femur, subsequent encounter for closed fracture with routine healing: Secondary | ICD-10-CM | POA: Diagnosis not present

## 2022-05-21 DIAGNOSIS — W19XXXD Unspecified fall, subsequent encounter: Secondary | ICD-10-CM | POA: Diagnosis not present

## 2022-05-21 DIAGNOSIS — W19XXXA Unspecified fall, initial encounter: Secondary | ICD-10-CM | POA: Diagnosis not present

## 2022-05-21 DIAGNOSIS — I10 Essential (primary) hypertension: Secondary | ICD-10-CM | POA: Diagnosis not present

## 2022-05-21 DIAGNOSIS — R600 Localized edema: Secondary | ICD-10-CM | POA: Diagnosis not present

## 2022-05-21 DIAGNOSIS — I129 Hypertensive chronic kidney disease with stage 1 through stage 4 chronic kidney disease, or unspecified chronic kidney disease: Secondary | ICD-10-CM | POA: Diagnosis not present

## 2022-05-21 DIAGNOSIS — E559 Vitamin D deficiency, unspecified: Secondary | ICD-10-CM | POA: Diagnosis not present

## 2022-05-21 DIAGNOSIS — I82422 Acute embolism and thrombosis of left iliac vein: Secondary | ICD-10-CM | POA: Diagnosis not present

## 2022-05-21 DIAGNOSIS — N178 Other acute kidney failure: Secondary | ICD-10-CM | POA: Diagnosis not present

## 2022-05-21 DIAGNOSIS — E43 Unspecified severe protein-calorie malnutrition: Secondary | ICD-10-CM | POA: Diagnosis not present

## 2022-05-21 DIAGNOSIS — I825Z2 Chronic embolism and thrombosis of unspecified deep veins of left distal lower extremity: Secondary | ICD-10-CM | POA: Diagnosis not present

## 2022-05-21 DIAGNOSIS — G510 Bell's palsy: Secondary | ICD-10-CM | POA: Diagnosis not present

## 2022-05-21 DIAGNOSIS — D500 Iron deficiency anemia secondary to blood loss (chronic): Secondary | ICD-10-CM | POA: Diagnosis not present

## 2022-05-21 DIAGNOSIS — K219 Gastro-esophageal reflux disease without esophagitis: Secondary | ICD-10-CM | POA: Diagnosis not present

## 2022-05-21 DIAGNOSIS — E1140 Type 2 diabetes mellitus with diabetic neuropathy, unspecified: Secondary | ICD-10-CM | POA: Diagnosis not present

## 2022-05-21 DIAGNOSIS — Z7901 Long term (current) use of anticoagulants: Secondary | ICD-10-CM | POA: Diagnosis not present

## 2022-05-21 DIAGNOSIS — S79192S Other physeal fracture of lower end of left femur, sequela: Secondary | ICD-10-CM | POA: Diagnosis not present

## 2022-05-21 DIAGNOSIS — N1832 Chronic kidney disease, stage 3b: Secondary | ICD-10-CM | POA: Diagnosis not present

## 2022-05-21 DIAGNOSIS — E538 Deficiency of other specified B group vitamins: Secondary | ICD-10-CM | POA: Diagnosis not present

## 2022-05-21 DIAGNOSIS — Z7401 Bed confinement status: Secondary | ICD-10-CM | POA: Diagnosis not present

## 2022-05-21 DIAGNOSIS — I110 Hypertensive heart disease with heart failure: Secondary | ICD-10-CM | POA: Diagnosis not present

## 2022-05-21 LAB — CBC
HCT: 31.6 % — ABNORMAL LOW (ref 36.0–46.0)
Hemoglobin: 10.3 g/dL — ABNORMAL LOW (ref 12.0–15.0)
MCH: 29.5 pg (ref 26.0–34.0)
MCHC: 32.6 g/dL (ref 30.0–36.0)
MCV: 90.5 fL (ref 80.0–100.0)
Platelets: 203 10*3/uL (ref 150–400)
RBC: 3.49 MIL/uL — ABNORMAL LOW (ref 3.87–5.11)
RDW: 15.4 % (ref 11.5–15.5)
WBC: 4.9 10*3/uL (ref 4.0–10.5)
nRBC: 0 % (ref 0.0–0.2)

## 2022-05-21 LAB — TYPE AND SCREEN
ABO/RH(D): O POS
Antibody Screen: NEGATIVE
Unit division: 0

## 2022-05-21 LAB — BPAM RBC
Blood Product Expiration Date: 202402142359
ISSUE DATE / TIME: 202401102025
Unit Type and Rh: 5100

## 2022-05-21 LAB — BASIC METABOLIC PANEL
Anion gap: 7 (ref 5–15)
BUN: 19 mg/dL (ref 8–23)
CO2: 26 mmol/L (ref 22–32)
Calcium: 9.4 mg/dL (ref 8.9–10.3)
Chloride: 109 mmol/L (ref 98–111)
Creatinine, Ser: 1.63 mg/dL — ABNORMAL HIGH (ref 0.44–1.00)
GFR, Estimated: 31 mL/min — ABNORMAL LOW (ref >60)
Glucose, Bld: 139 mg/dL — ABNORMAL HIGH (ref 70–99)
Potassium: 4.7 mmol/L (ref 3.5–5.1)
Sodium: 142 mmol/L (ref 135–145)

## 2022-05-21 LAB — GLUCOSE, CAPILLARY
Glucose-Capillary: 128 mg/dL — ABNORMAL HIGH (ref 70–99)
Glucose-Capillary: 259 mg/dL — ABNORMAL HIGH (ref 70–99)
Glucose-Capillary: 166 mg/dL — ABNORMAL HIGH (ref 70–99)

## 2022-05-21 LAB — MAGNESIUM: Magnesium: 2.3 mg/dL (ref 1.7–2.4)

## 2022-05-21 MED ORDER — RIVAROXABAN 20 MG PO TABS: 20.0000 mg | ORAL_TABLET | Freq: Every day | ORAL | Status: DC

## 2022-05-21 MED ORDER — SODIUM CHLORIDE 0.9 % IV SOLN
400.0000 mg | Freq: Once | INTRAVENOUS | Status: AC
  Administered 2022-05-21: 400 mg via INTRAVENOUS
  Filled 2022-05-21: qty 20

## 2022-05-21 MED ORDER — HYDROCODONE-ACETAMINOPHEN 5-325 MG PO TABS: 1.0000 | ORAL_TABLET | Freq: Four times a day (QID) | ORAL | 0 refills | Status: AC | PRN

## 2022-05-21 NOTE — Discharge Summary (Addendum)
Physician Discharge Summary   Patient: Brandy Gonzales MRN: [id number] DOB: 01-17-1941  Admit date:     05/17/2022  Discharge date: 05/21/22  Discharge Physician: [id number],VOHYWVP   PCP: Cassandria Anger, MD   Recommendations at discharge:  Discharge Diagnoses: Principal Problem:   Acute deep vein thrombosis of left iliac vein [facility]) Active Problems:   Current long-term use of anticoagulant medication with history of deep venous thrombosis 04/2022 (DVT)   History of insertion of IVC (inferior vena caval) filter 2006   Uncontrolled type 2 diabetes mellitus with hyperglycemia, without long-term current use of insulin (HCC)   Essential hypertension   Stage 3b chronic kidney disease (CKD) ([place])   Left leg swelling  Resolved Problems:   * No resolved hospital problems. [facility] Course: No notes on file  Assessment and Plan: 82 year old female with history of hypertension, diabetes mellitus type 2, CKD stage IIIb, protein calorie malnutrition is brought to the emergency department on 05/17/2018 for worsening of the left lower extremity swelling.  The patient had a recent admission for left hip fracture complicated by acute extensive DVT requiring IVC filter placement and was discharged on Eliquis to skilled nursing facility.  In the emergency department lower extremity Dopplers suggested more proximal occlusion of the acute occlusive thrombus in the left common iliac, IVC filter in place infrarenally.  Patient is started on heparin drip, underwent mechanical thrombectomy by vascular surgery, continues to notice improvement with swelling.  Consulted oncology, switch to Xarelto.  Patient's hemoglobin trended down from baseline of 9-7.9.  Patient also received IV iron.  Recommend patient follow-up with primary care physician, orthopedic surgery as an outpatient in 1 to 2 weeks   Acute DVT extensive involving the left iliac vein, common femoral vein -Failed outpatient treatment with  Eliquis -S/p IVC filter placement -S/p mechanical thrombectomy by vascular surgery -Consulted heme-onc-recommended Xarelto 20 mg p.o. daily.   Diabetes mellitus poorly controlled with hyperglycemia -Hemoglobin A1c on 04/22/2022 7.8 -Currently on sliding scale insulin.   Hypertension -Continue the Coreg twice daily     Recent left hip fracture -Continue with physical therapy, Occupational Therapy   Acute blood loss anemia -Get iron profile, B12, folate RBC, reticulocyte count looking into any correctable causes.   Acute on chronic renal failure stage IIIb -Could be from anemia causing ATN -Closely follow-up with the BMP     Consultants: Vascular surgery, heme-onc Procedures performed: Mechanical thrombectomy Disposition: Skilled nursing facility Diet recommendation:   Diet recommendation:  Discharge Diet Orders (From admission, onward)     Start     Ordered   05/21/22 0000  Diet - low sodium heart healthy        05/21/22 1115           Cardiac diet DISCHARGE MEDICATION: Allergies as of 05/21/2022       Reactions   Amlodipine Besylate    REACTION: hair loss   Atenolol    REACTION: fluid retention   Enalapril Maleate    REACTION: palpitations   Oxycodone Hcl    REACTION: dizzy   Propoxyphene N-acetaminophen Other (See Comments)   Unknown reaction        Medication List     STOP taking these medications    apixaban 5 MG Tabs tablet Commonly known as: ELIQUIS   cephALEXin 250 MG capsule Commonly known as: KEFLEX       TAKE these medications    acetaminophen 500 MG tablet Commonly known as: TYLENOL Take 500 mg by mouth  every 6 (six) hours as needed for moderate pain.   carvedilol 25 MG tablet Commonly known as: COREG Take 1 tablet (25 mg total) by mouth 2 (two) times daily with a meal.   cyanocobalamin 1000 MCG tablet Take 1 tablet (1,000 mcg total) by mouth daily.   dapagliflozin propanediol 10 MG Tabs tablet Commonly known as:  Farxiga Take 1 tablet (10 mg total) by mouth daily before breakfast.   folic acid 1 MG tablet Commonly known as: FOLVITE Take 1 tablet (1 mg total) by mouth daily.   glimepiride 2 MG tablet Commonly known as: AMARYL Take 1 tablet (2 mg total) by mouth daily before breakfast.   HYDROcodone-acetaminophen 5-325 MG tablet Commonly known as: NORCO/VICODIN Take 1 tablet by mouth every 6 (six) hours as needed for up to 3 days for severe pain (POSTOPERATIVE PAIN).   mupirocin ointment 2 % Commonly known as: BACTROBAN On leg wound w/dressing change qd or bid What changed:  how much to take how to take this when to take this reasons to take this additional instructions   omeprazole 20 MG capsule Commonly known as: PRILOSEC Take 1 capsule (20 mg total) by mouth daily.   rivaroxaban 20 MG Tabs tablet Commonly known as: XARELTO Take 1 tablet (20 mg total) by mouth daily. Start taking on: May 22, 2022   thiamine 100 MG tablet Commonly known as: Vitamin B-1 Take 1 tablet (100 mg total) by mouth daily.   torsemide 20 MG tablet Commonly known as: DEMADEX Take 1-2 tablets (20-40 mg total) by mouth daily as needed. What changed: reasons to take this   Vitamin D3 50 MCG (2000 UT) capsule Take 1 capsule (2,000 Units total) by mouth daily.        Contact information for after-discharge care     [facility] .   Service: Skilled Nursing Contact information: [place] [phone]                    Discharge Exam: Danley Danker Weights   05/17/22 2007 05/20/22 0400  Weight: 60.5 kg 58.1 kg     Condition at discharge: stable  The results of significant diagnostics from this hospitalization (including imaging, microbiology, ancillary and laboratory) are listed below for reference.   Imaging Studies: PERIPHERAL VASCULAR  CATHETERIZATION  Result Date: 05/18/2022 See surgical note for result.  CT VENOGRAM ABD/PELVIS/LOWER EXT BILAT  Result Date: 05/18/2022 CLINICAL DATA:  Concern for central obstruction on ultrasound today EXAM: CT VENOGRAM ABDOMEN AND PELVIS AND LOWER EXTREMITY BILATERAL TECHNIQUE: RADIATION DOSE REDUCTION: This exam was performed according to the departmental dose-optimization program which includes automated exposure control, adjustment of the mA and/or kV according to patient size and/or use of iterative reconstruction technique. CONTRAST:  173mL OMNIPAQUE IOHEXOL 350 MG/ML SOLN COMPARISON:  None Available. FINDINGS: Lower chest: Bibasilar atelectasis/scarring. 1.8 cm triangular consolidation in the right lower lobe posteriorly is not significantly changed from 01/22/2011. No acute abnormality. Hepatobiliary: Intrahepatic biliary dilation greatest in the right hepatic lobe is similar to slightly increased from 2012. Unremarkable gallbladder. No dilation of the common bile duct. No suspicious focal hepatic lesion. Pancreas: Mild prominence of the pancreatic duct in the pancreatic head measuring 5 mm. No evidence of acute inflammation. Spleen: Unremarkable. Adrenals/Urinary Tract: Normal adrenal glands. Low-attenuation lesions in the kidneys are statistically likely to represent cysts. No follow-up is required. Bilateral cortical  renal scarring. Urinary calculi or hydronephrosis. Unremarkable bladder. Stomach/Bowel: Colonic diverticulosis without diverticulitis. Normal caliber large and small bowel. Normal appendix. Unremarkable stomach. Arterial/lymphatic: Aortic atherosclerosis. No enlarged abdominal or pelvic lymph nodes. Reproductive: Hysterectomy.  No adnexal mass. Other: Small amount of free fluid in the pelvis. No free intraperitoneal air. Musculoskeletal: IM rod and screw fixations of the left femur and proximal tibia. No acute fracture. Body wall edema. IVC: IVC filter. The IVC is patent. No thrombus or  stenosis in the IVC. Portal and mesenteric veins: No evidence for thrombus or stenosis. Bilateral iliac veins: Expansile occlusive thrombus in the proximal left common iliac vein extending into the left external iliac vein and common femoral vein. The right iliac veins are patent. Right lower extremity: No evidence for thrombus involving the common femoral, and femoral vein. Popliteal and visualized deep calf veins Left lower extremity: Nonocclusive thrombus is visualized within the common femoral and femoral vein. No definite clot in the popliteal or visualized deep calf veins. IMPRESSION: Acute occlusive thrombus in the left common iliac vein. Nonocclusive thrombus extends into the left external iliac, common femoral and superficial femoral veins. Findings are suggestive of May-Thurner syndrome. IVC filter in the infrarenal IVC. These results were called by telephone at the time of interpretation on 05/18/2022 at 1:51 am to provider [facility] , who verbally acknowledged these results. Electronically Signed   By: Placido Sou M.D.   On: 05/18/2022 01:53   US Venous Img Lower Unilateral Left  Result Date: 05/17/2022 CLINICAL DATA:  DVT, progressive left lower extremity swelling EXAM: LEFT LOWER EXTREMITY VENOUS DOPPLER ULTRASOUND TECHNIQUE: Gray-scale sonography with compression, as well as color and duplex ultrasound, were performed to evaluate the deep venous system(s) from the level of the common femoral vein through the popliteal and proximal calf veins. COMPARISON:  None Available. FINDINGS: VENOUS Normal compressibility of the common femoral, superficial femoral, and popliteal veins, as well as the visualized calf veins. Visualized portions of profunda femoral vein and great saphenous vein unremarkable. No filling defects to suggest DVT on grayscale or color Doppler imaging. Doppler waveforms show normal direction of venous flow and response to augmentation. There is, however, lack of respiratory  variation within the left common femoral vein which suggests a more central obstruction. Limited views of the contralateral common femoral vein are unremarkable. OTHER None. Limitations: none IMPRESSION: 1. No evidence of femoropopliteal DVT within the left lower extremity. There is, however, lack of respiratory variation within the left common femoral vein which suggests a more central obstruction. If indicated, this would be better assessed with CT venography of the abdomen and pelvis. Electronically Signed   By: Fidela Salisbury M.D.   On: 05/17/2022 23:20   VAS [place] LOWER EXTREMITY VENOUS (DVT)  Result Date: 05/05/2022  Lower Venous DVT Study Patient Name:  JORDI KAMM  Date of Exam:   05/04/2022 Medical Rec #: [id number]     Accession #:    [id number] Date of Birth: 1941-04-11     Patient Gender: F Patient Age:   78 years Exam Location:  [facility] Procedure:      VAS [place] LOWER EXTREMITY VENOUS (DVT) Referring Phys: RIPUDEEP RAI --------------------------------------------------------------------------------  Indications: Swelling. Other Indications: S/P LT ORIF (femur fracture) on 12/17 - immobility. Comparison Study: No previous Performing Technologist: Jody Hill RVT, RDMS  Examination Guidelines: A complete evaluation includes B-mode imaging, spectral Doppler, color Doppler, and power Doppler as needed of all accessible portions of each vessel. Bilateral testing is considered  an integral part of a complete examination. Limited examinations for reoccurring indications may be performed as noted. The reflux portion of the exam is performed with the patient in reverse Trendelenburg.  +---------+---------------+---------+-----------+----------+--------------+ RIGHT    CompressibilityPhasicitySpontaneityPropertiesThrombus Aging +---------+---------------+---------+-----------+----------+--------------+ CFV      Full           Yes      Yes                                  +---------+---------------+---------+-----------+----------+--------------+ SFJ      Full                                                        +---------+---------------+---------+-----------+----------+--------------+ FV Prox  Full           Yes      Yes                                 +---------+---------------+---------+-----------+----------+--------------+ FV Mid   Full           Yes      Yes                                 +---------+---------------+---------+-----------+----------+--------------+ FV DistalFull           Yes      Yes                                 +---------+---------------+---------+-----------+----------+--------------+ PFV      Full                                                        +---------+---------------+---------+-----------+----------+--------------+ POP      Full           Yes      Yes                                 +---------+---------------+---------+-----------+----------+--------------+ PTV      Full                                                        +---------+---------------+---------+-----------+----------+--------------+ PERO     Full                                                        +---------+---------------+---------+-----------+----------+--------------+   +---------+---------------+---------+-----------+----------+--------------+ LEFT     CompressibilityPhasicitySpontaneityPropertiesThrombus Aging +---------+---------------+---------+-----------+----------+--------------+ CFV      None           No  Yes                  Acute          +---------+---------------+---------+-----------+----------+--------------+ SFJ      None                                         Acute          +---------+---------------+---------+-----------+----------+--------------+ FV Prox  Partial        No       No                   Acute           +---------+---------------+---------+-----------+----------+--------------+ FV Mid   None           No       No                   Acute          +---------+---------------+---------+-----------+----------+--------------+ FV DistalNone           No       No                   Acute          +---------+---------------+---------+-----------+----------+--------------+ PFV      None           No       No                   Acute          +---------+---------------+---------+-----------+----------+--------------+ POP      None           No       No                   Acute          +---------+---------------+---------+-----------+----------+--------------+ PTV      Full                                                        +---------+---------------+---------+-----------+----------+--------------+ PERO                                                  Not visualized +---------+---------------+---------+-----------+----------+--------------+     Summary: BILATERAL: -No evidence of popliteal cyst, bilaterally. RIGHT: - There is no evidence of deep vein thrombosis in the lower extremity.  LEFT: - Findings consistent with acute deep vein thrombosis involving the left common femoral vein, SF junction, left femoral vein, left proximal profunda vein, and left popliteal vein.  *See table(s) above for measurements and observations. Electronically signed by Deitra Mayo MD on 05/05/2022 at 4:30:58 AM.    Final    VAS [place] IVC/ILIAC (VENOUS ONLY)  Result Date: 05/05/2022 IVC/ILIAC STUDY Patient Name:  RAFFAELLA EDISON  Date of Exam:   05/04/2022 Medical Rec #: [id number]     Accession #:    [id number] Date of Birth: [birth year]     Patient Gender: F Patient Age:   37 years Exam Location:  [facility] Procedure:      VAS [place] IVC/ILIAC (VENOUS ONLY) Referring Phys: Georgeanna Harrison --------------------------------------------------------------------------------  Indications: DVT LLE  Limitations: Air/bowel gas and poor tissue/ultrasound interface.  Comparison Study: No previous exams Performing Technologist: Jody Hill RVT, RDMS  Examination Guidelines: A complete evaluation includes B-mode imaging, spectral Doppler, color Doppler, and power Doppler as needed of all accessible portions of each vessel. Bilateral testing is considered an integral part of a complete examination. Limited examinations for reoccurring indications may be performed as noted.  IVC/Iliac Findings: +----------+------+--------+--------+    IVC    PatentThrombusComments +----------+------+--------+--------+ IVC Prox  patent                 +----------+------+--------+--------+ IVC Mid   patent                 +----------+------+--------+--------+ IVC Distalpatent                 +----------+------+--------+--------+  +------------------+---------+-----------+---------+-----------+---------------+        CIV        RT-PatentRT-ThrombusLT-PatentLT-Thrombus   Comments     +------------------+---------+-----------+---------+-----------+---------------+ Common Iliac Prox                                 acute                   +------------------+---------+-----------+---------+-----------+---------------+ Common Iliac Mid                                  acute       partial                                                                  thrombus     +------------------+---------+-----------+---------+-----------+---------------+ Common Iliac                                              not visualized  Distal                                                                    +------------------+---------+-----------+---------+-----------+---------------+  +-------------------------+---------+-----------+---------+-----------+--------+            EIV           RT-PatentRT-ThrombusLT-PatentLT-ThrombusComments  +-------------------------+---------+-----------+---------+-----------+--------+ External Iliac Vein Prox                                 acute            +-------------------------+---------+-----------+---------+-----------+--------+ External Iliac Vein Mid                                  acute            +-------------------------+---------+-----------+---------+-----------+--------+  External Iliac Vein                                      acute            Distal                                                                    +-------------------------+---------+-----------+---------+-----------+--------+   Summary: IVC/Iliac: There is no evidence of thrombus involving the IVC. There is evidence of acute thrombus involving the left common iliac vein. There is evidence of acute thrombus involving the left external iliac vein. . Distal portion of left common iliac vein not visualized on this exam.  *See table(s) above for measurements and observations.  Electronically signed by Deitra Mayo MD on 05/05/2022 at 4:29:52 AM.    Final    MR BRAIN WO CONTRAST  Result Date: 04/27/2022 CLINICAL DATA:  Altered mental status EXAM: MRI HEAD WITHOUT CONTRAST TECHNIQUE: Multiplanar, multiecho pulse sequences of the brain and surrounding structures were obtained without intravenous contrast. COMPARISON:  Same-day CT/CTA head and neck, brain MRI 10/17/2021 FINDINGS: Image quality is intermittently motion degraded. Specifically, the axial FLAIR sequence is moderately motion degraded. Brain: There is no evidence of acute intracranial hemorrhage, extra-axial fluid collection, or acute territorial infarct. Tiny foci of SWI signal abnormality in the high right frontoparietal region are favored artifactual. There is unchanged parenchymal volume loss with prominence of the ventricular system and extra-axial CSF spaces. There is extensive patchy and confluence FLAIR signal abnormality  throughout the supratentorial brain consistent with advanced chronic small-vessel ischemic change. Encephalomalacia in the left frontal lobe consistent with prior infarct is unchanged. Small remote infarcts in the bilateral cerebellar hemispheres are unchanged. Old hemorrhage in the bilateral parietal lobes is unchanged likely associated with prior infarcts There is no mass lesion.  There is no mass effect or midline shift. Vascular: Normal flow voids. Skull and upper cervical spine: Normal marrow signal. Sinuses/Orbits: The paranasal sinuses are clear. Bilateral lens implants are in place. The globes and orbits are otherwise unremarkable. Other: None. IMPRESSION: 1. No definite evidence of acute intracranial pathology, within the confines of motion degraded images. 2. Stable chronic findings as above. Electronically Signed   By: Valetta Mole M.D.   On: 04/27/2022 21:37   EEG adult  Result Date: 04/27/2022 Lora Havens, MD     04/27/2022  2:48 PM Patient Name: KYRENE LONGAN MRN: [id number] Epilepsy Attending: Lora Havens Referring Physician/Provider: Donnetta Simpers, MD Date: 04/27/2022 Duration: 22.03 mins Patient history: 82 year old female with presyncopal episode this morning now with aphasia.  EEG to evaluate for seizure. Level of alertness: Awake AEDs during EEG study: None Technical aspects: This EEG study was done with scalp electrodes positioned according to the 10-20 International system of electrode placement. Electrical activity was reviewed with band pass filter of 1-70Hz , sensitivity of 7 uV/mm, display speed of 45mm/sec with a 60Hz  notched filter applied as appropriate. EEG data were recorded continuously and digitally stored.  Video monitoring was available and reviewed as appropriate. Description: No clear posterior dominant rhythm was seen.  EEG showed continuous generalized predominantly 5 to 7 Hz theta slowing admixed with  intermittent generalized 2 to 3 Hz delta slowing.  Hyperventilation and photic stimulation were not performed.   ABNORMALITY - Continuous slow, generalized IMPRESSION: This study is suggestive of moderate diffuse encephalopathy, nonspecific etiology. No seizures or epileptiform discharges were seen throughout the recording. Priyanka Barbra Sarks   CT ANGIO HEAD NECK W WO CM (CODE STROKE)  Result Date: 04/27/2022 CLINICAL DATA:  Aphasia out of proportion to Encephalopathy. EXAM: CT ANGIOGRAPHY HEAD AND NECK TECHNIQUE: Multidetector CT imaging of the head and neck was performed using the standard protocol during bolus administration of intravenous contrast. Multiplanar CT image reconstructions and MIPs were obtained to evaluate the vascular anatomy. Carotid stenosis measurements (when applicable) are obtained utilizing NASCET criteria, using the distal internal carotid diameter as the denominator. RADIATION DOSE REDUCTION: This exam was performed according to the departmental dose-optimization program which includes automated exposure control, adjustment of the mA and/or kV according to patient size and/or use of iterative reconstruction technique. CONTRAST:  63mL OMNIPAQUE IOHEXOL 350 MG/ML SOLN COMPARISON:  Head CT earlier same day.  MRI 11/16/2021 FINDINGS: CTA NECK FINDINGS Aortic arch: Aortic atherosclerosis. Branching pattern is normal without origin stenosis. Right carotid system: Common carotid artery widely patent to the bifurcation. Calcified plaque at the carotid bifurcation and ICA bulb but no stenosis. Cervical ICA widely patent. Left carotid system: Common carotid artery widely patent to the bifurcation. No plaque at the bifurcation or ICA bulb. No ICA stenosis. Vertebral arteries: Both vertebral arteries widely patent at their origins and through the cervical region to the foramen magnum. Skeleton: Chronic cervical spondylosis. Other neck: No mass or lymphadenopathy. Upper chest: Normal Review of the MIP images confirms the above findings CTA HEAD  FINDINGS Anterior circulation: Both internal carotid arteries are patent through the skull base and siphon regions. There is ordinary siphon atherosclerotic calcification but no stenosis greater than 30-50%. The anterior and middle cerebral vessels are patent. No large vessel occlusion or proximal stenosis. No aneurysm or vascular malformation. Posterior circulation: Both vertebral arteries widely patent to the basilar. No basilar stenosis. Posterior circulation branch vessels are normal. Venous sinuses: Patent and normal. Anatomic variants: None significant. Review of the MIP images confirms the above findings IMPRESSION: 1. No large vessel occlusion or proximal stenosis. 2. Aortic atherosclerosis. 3. Atherosclerotic change at the right carotid bifurcation but no stenosis. 4. Atherosclerotic disease in both carotid siphon regions but without stenosis greater than 30-50% suspected. Aortic Atherosclerosis (ICD10-I70.0). Electronically Signed   By: Nelson Chimes M.D.   On: 04/27/2022 13:10   CT HEAD CODE STROKE WO CONTRAST  Result Date: 04/27/2022 CLINICAL DATA:  Code stroke. Neuro deficit, acute, stroke suspected. EXAM: CT HEAD WITHOUT CONTRAST TECHNIQUE: Contiguous axial images were obtained from the base of the skull through the vertex without intravenous contrast. RADIATION DOSE REDUCTION: This exam was performed according to the departmental dose-optimization program which includes automated exposure control, adjustment of the mA and/or kV according to patient size and/or use of iterative reconstruction technique. COMPARISON:  11/16/2021 FINDINGS: Brain: Generalized atrophy. No focal abnormality seen affecting the brainstem or cerebellum. Extensive chronic appearing small vessel ischemic changes of the cerebral hemispheric white matter. No sign of acute large vessel territory stroke. No mass lesion, hemorrhage, hydrocephalus or extra-axial collection. Vascular: There is atherosclerotic calcification of the  major vessels at the base of the brain. Skull: Negative Sinuses/Orbits: Clear/normal Other: None ASPECTS ([place] Stroke Program Early CT Score) - Ganglionic level infarction (caudate, lentiform nuclei, internal capsule, insula, M1-M3 cortex): 7 - Supraganglionic infarction (M4-M6  cortex): 3 Total score (0-10 with 10 being normal): 10 IMPRESSION: 1. No acute CT finding. Atrophy and extensive chronic small-vessel ischemic changes of the white matter. 2. Aspects is 10. These results were communicated to Dr. Lorrin Goodell at 12:54 pm on 04/27/2022 by text page via the [facility] messaging system. Electronically Signed   By: Nelson Chimes M.D.   On: 04/27/2022 12:55   DG FEMUR MIN 2 VIEWS LEFT  Result Date: 04/26/2022 CLINICAL DATA:  Left hip ORIF EXAM: LEFT FEMUR 2 VIEWS COMPARISON:  04/25/2022 FINDINGS: Interval postsurgical changes from ORIF of intertrochanteric left femur fracture with long intramedullary rod, proximal lag screw, and distal interlocking screws. Anatomic alignment at the fracture site. No new fractures. No malalignment. Expected postoperative changes within the overlying soft tissues. IMPRESSION: Interval ORIF of intertrochanteric left femur fracture. No postoperative complications are evident. Electronically Signed   By: Davina Poke D.O.   On: 04/26/2022 14:11   DG FEMUR MIN 2 VIEWS LEFT  Result Date: 04/26/2022 CLINICAL DATA:  Left hip surgery EXAM: LEFT FEMUR 2 VIEWS COMPARISON:  04/25/2022 FINDINGS: Six C-arm fluoroscopic images were obtained intraoperatively and submitted for post operative interpretation. Images demonstrate placement of ORIF hardware traversing proximal left femur fracture with improved alignment. 105 seconds fluoroscopy time utilized. Radiation dose: 6.36 mGy. Please see the performing provider's procedural report for further detail. IMPRESSION: Intraoperative fluoroscopy provided for left hip ORIF. Electronically Signed   By: Davina Poke D.O.   On: 04/26/2022 13:31    DG C-Arm 1-60 Min-No Report  Result Date: 04/26/2022 Fluoroscopy was utilized by the requesting physician.  No radiographic interpretation.   DG Femur Min 2 Views Left  Result Date: 04/25/2022 CLINICAL DATA:  Left hip fracture. EXAM: LEFT FEMUR 2 VIEWS COMPARISON:  Earlier same day hip radiograph at 1915 hours FINDINGS: Comminuted intertrochanteric fracture, better visualized on earlier same day radiograph secondary to overlying soft tissues. No other acute fracture of the left femur. Partially visualized intramedullary rod with proximal locking screws in the tibia. Small ossification noted adjacent to the medial femoral condyle, likely sequela from prior trauma. No suprapatellar knee joint effusion. Vascular calcifications are seen in the left thigh and left lower leg. IMPRESSION: Acute comminuted intertrochanteric fracture, better visualized on earlier same day hip radiograph due to overlying soft tissues. No other acute fractures of the left femur. Electronically Signed   By: Ileana Roup M.D.   On: 04/25/2022 20:38   DG Chest 1 View  Result Date: 04/25/2022 CLINICAL DATA:  Fall EXAM: CHEST  1 VIEW COMPARISON:  Chest x-ray 09/27/2021 FINDINGS: The heart size and mediastinal contours are within normal limits. Both lungs are clear. The visualized skeletal structures are unremarkable. IMPRESSION: No active disease. Electronically Signed   By: Ronney Asters M.D.   On: 04/25/2022 19:49   DG Hip Unilat W or Wo Pelvis 2-3 Views Left  Result Date: 04/25/2022 CLINICAL DATA:  Hip pain, fall. EXAM: DG HIP (WITH OR WITHOUT PELVIS) 2-3V LEFT COMPARISON:  None. FINDINGS: There is a comminuted left femoral intratrochanteric fracture with apex lateral angulation and overlying soft tissue swelling. There is no dislocation identified. IMPRESSION: Comminuted left femoral intratrochanteric fracture. Electronically Signed   By: Ronney Asters M.D.   On: 04/25/2022 19:44    Microbiology: Results for orders  placed or performed during the hospital encounter of 04/25/22  Surgical PCR screen     Status: None   Collection Time: 04/25/22 11:56 PM   Specimen: Nasal Mucosa; Nasal Swab  Result Value  Ref Range Status   MRSA, PCR NEGATIVE NEGATIVE Final   Staphylococcus aureus NEGATIVE NEGATIVE Final    Comment: (NOTE) The Xpert SA Assay (FDA approved for NASAL specimens in patients 65 years of age and older), is one component of a comprehensive surveillance program. It is not intended to diagnose infection nor to guide or monitor treatment. Performed at [facility], [place] [address], [place], [place] [address]   Respiratory (~20 pathogens) panel by PCR     Status: Abnormal   Collection Time: 05/01/22  9:03 AM   Specimen: Nasopharyngeal Swab; Respiratory  Result Value Ref Range Status   Adenovirus NOT DETECTED NOT DETECTED Final   Coronavirus 229E NOT DETECTED NOT DETECTED Final    Comment: (NOTE) The Coronavirus on the Respiratory Panel, DOES NOT test for the novel  Coronavirus (2019 nCoV)    Coronavirus HKU1 NOT DETECTED NOT DETECTED Final   Coronavirus NL63 NOT DETECTED NOT DETECTED Final   Coronavirus OC43 NOT DETECTED NOT DETECTED Final   Metapneumovirus NOT DETECTED NOT DETECTED Final   Rhinovirus / Enterovirus NOT DETECTED NOT DETECTED Final   Influenza A H1 2009 DETECTED (A) NOT DETECTED Final   Influenza B NOT DETECTED NOT DETECTED Final   Parainfluenza Virus 1 NOT DETECTED NOT DETECTED Final   Parainfluenza Virus 2 NOT DETECTED NOT DETECTED Final   Parainfluenza Virus 3 NOT DETECTED NOT DETECTED Final   Parainfluenza Virus 4 NOT DETECTED NOT DETECTED Final   Respiratory Syncytial Virus NOT DETECTED NOT DETECTED Final   Bordetella pertussis NOT DETECTED NOT DETECTED Final   Bordetella Parapertussis NOT DETECTED NOT DETECTED Final   Chlamydophila pneumoniae NOT DETECTED NOT DETECTED Final   Mycoplasma pneumoniae NOT DETECTED NOT DETECTED Final    Comment: Performed at [facility], [place]. [address], [place], [place] [address]  Resp panel by RT-PCR (RSV, Flu A&B, Covid) Anterior Nasal Swab     Status: Abnormal   Collection Time: 05/01/22  9:05 AM   Specimen: Anterior Nasal Swab  Result Value Ref Range Status   SARS Coronavirus 2 by RT PCR NEGATIVE NEGATIVE Final    Comment: (NOTE) SARS-CoV-2 target nucleic acids are NOT DETECTED.  The SARS-CoV-2 RNA is generally detectable in upper respiratory specimens during the acute phase of infection. The lowest concentration of SARS-CoV-2 viral copies this assay can detect is 138 copies/mL. A negative result does not preclude SARS-Cov-2 infection and should not be used as the sole basis for treatment or other patient management decisions. A negative result may occur with  improper specimen collection/handling, submission of specimen other than nasopharyngeal swab, presence of viral mutation(s) within the areas targeted by this assay, and inadequate number of viral copies(<138 copies/mL). A negative result must be combined with clinical observations, patient history, and epidemiological information. The expected result is Negative.  Fact Sheet for Patients:  EntrepreneurPulse.com.au  Fact Sheet for Healthcare Providers:  IncredibleEmployment.be  This test is no t yet approved or cleared by the [place] FDA and  has been authorized for detection and/or diagnosis of SARS-CoV-2 by FDA under an Emergency Use Authorization (EUA). This EUA will remain  in effect (meaning this test can be used) for the duration of the COVID-19 declaration under Section 564(b)(1) of the Act, 21 U.S.C.section 360bbb-3(b)(1), unless the authorization is terminated  or revoked sooner.       Influenza A by PCR POSITIVE (A) NEGATIVE Final   Influenza B by PCR NEGATIVE NEGATIVE Final    Comment: (NOTE)  The Xpert Xpress SARS-CoV-2/FLU/RSV plus assay is intended as an aid in the diagnosis of  influenza from Nasopharyngeal swab specimens and should not be used as a sole basis for treatment. Nasal washings and aspirates are unacceptable for Xpert Xpress SARS-CoV-2/FLU/RSV testing.  Fact Sheet for Patients: EntrepreneurPulse.com.au  Fact Sheet for Healthcare Providers: IncredibleEmployment.be  This test is not yet approved or cleared by the [place] FDA and has been authorized for detection and/or diagnosis of SARS-CoV-2 by FDA under an Emergency Use Authorization (EUA). This EUA will remain in effect (meaning this test can be used) for the duration of the COVID-19 declaration under Section 564(b)(1) of the Act, 21 U.S.C. section 360bbb-3(b)(1), unless the authorization is terminated or revoked.     Resp Syncytial Virus by PCR NEGATIVE NEGATIVE Final    Comment: (NOTE) Fact Sheet for Patients: EntrepreneurPulse.com.au  Fact Sheet for Healthcare Providers: IncredibleEmployment.be  This test is not yet approved or cleared by the [place] FDA and has been authorized for detection and/or diagnosis of SARS-CoV-2 by FDA under an Emergency Use Authorization (EUA). This EUA will remain in effect (meaning this test can be used) for the duration of the COVID-19 declaration under Section 564(b)(1) of the Act, 21 U.S.C. section 360bbb-3(b)(1), unless the authorization is terminated or revoked.  Performed at [facility], [place] [address] [place], [place] [address]     Labs: CBC: Recent Labs  Lab 05/17/22 2010 05/19/22 0154 05/20/22 0018 05/21/22 0303  WBC 6.8 6.3 5.4 4.9  NEUTROABS 4.2  --   --   --   HGB 9.2* 8.2* 7.9* 10.3*  HCT 28.8* 25.0* 24.3* 31.6*  MCV 92.6 90.9 91.0 90.5  PLT 269 212 217 876   Basic Metabolic Panel: Recent Labs  Lab 05/17/22 2010 05/19/22 0154 05/20/22 0018 05/21/22 0303  NA 136 137 140 142  K 4.4 4.2 4.4 4.7  CL 106 109 111 109  CO2 20* 22 24 26    GLUCOSE 268* 191* 150* 139*  BUN 26* 21 22 19   CREATININE 1.59* 1.50* 1.61* 1.63*  CALCIUM 9.2 8.7* 8.8* 9.4  MG  --  2.0 2.1 2.3   Liver Function Tests: Recent Labs  Lab 05/17/22 2010  AST 25  ALT 20  ALKPHOS 146*  BILITOT 0.7  PROT 5.9*  ALBUMIN 2.9*   CBG: Recent Labs  Lab 05/20/22 1135 05/20/22 1607 05/20/22 2051 05/21/22 0753 05/21/22 1107  GLUCAP 253* 155* 221* 128* 259*    Discharge time spent: greater than 30 minutes.  SignedMonica Becton, MD [facility] 05/21/2022

## 2022-05-21 NOTE — Care Management Important Message (Signed)
Important Message  Patient Details  Name: Brandy Gonzales MRN: [id number] Date of Birth: 06-28-1940   Medicare Important Message Given:  Yes     Dannette Barbara 05/21/2022, 1:45 PM

## 2022-05-21 NOTE — Progress Notes (Signed)
Nursing Discharge Note   Admit Date: 05/17/2022  Discharge date: 05/21/2022   Brandy Gonzales is to be discharged to a [facility] per MD order.  AVS completed, placed in discharge packet for facility review. Discharge packet compiled for facility. Non-emergency ambulance transport arranged. Report called to Manfred Shirts RN at [facility].   Allergies as of 05/21/2022       Reactions   Amlodipine Besylate    REACTION: hair loss   Atenolol    REACTION: fluid retention   Enalapril Maleate    REACTION: palpitations   Oxycodone Hcl    REACTION: dizzy   Propoxyphene N-acetaminophen Other (See Comments)   Unknown reaction        Medication List     STOP taking these medications    apixaban 5 MG Tabs tablet Commonly known as: ELIQUIS   cephALEXin 250 MG capsule Commonly known as: KEFLEX       TAKE these medications    acetaminophen 500 MG tablet Commonly known as: TYLENOL Take 500 mg by mouth every 6 (six) hours as needed for moderate pain.   carvedilol 25 MG tablet Commonly known as: COREG Take 1 tablet (25 mg total) by mouth 2 (two) times daily with a meal.   cyanocobalamin 1000 MCG tablet Take 1 tablet (1,000 mcg total) by mouth daily.   dapagliflozin propanediol 10 MG Tabs tablet Commonly known as: Farxiga Take 1 tablet (10 mg total) by mouth daily before breakfast.   folic acid 1 MG tablet Commonly known as: FOLVITE Take 1 tablet (1 mg total) by mouth daily.   glimepiride 2 MG tablet Commonly known as: AMARYL Take 1 tablet (2 mg total) by mouth daily before breakfast.   HYDROcodone-acetaminophen 5-325 MG tablet Commonly known as: NORCO/VICODIN Take 1 tablet by mouth every 6 (six) hours as needed for up to 3 days for severe pain (POSTOPERATIVE PAIN).   mupirocin ointment 2 % Commonly known as: BACTROBAN On leg wound w/dressing change qd or bid What changed:  how much to take how to take this when to take this reasons to take  this additional instructions   omeprazole 20 MG capsule Commonly known as: PRILOSEC Take 1 capsule (20 mg total) by mouth daily.   rivaroxaban 20 MG Tabs tablet Commonly known as: XARELTO Take 1 tablet (20 mg total) by mouth daily. Start taking on: May 22, 2022   thiamine 100 MG tablet Commonly known as: Vitamin B-1 Take 1 tablet (100 mg total) by mouth daily.   torsemide 20 MG tablet Commonly known as: DEMADEX Take 1-2 tablets (20-40 mg total) by mouth daily as needed. What changed: reasons to take this   Vitamin D3 50 MCG (2000 UT) capsule Take 1 capsule (2,000 Units total) by mouth daily.         Discharge Instructions     Diet - low sodium heart healthy   Complete by: As directed    Increase activity slowly   Complete by: As directed    No wound care   Complete by: As directed         [facility] to provide transportation to facility for patient. Non-emergency ambulance transport at bedside. Handoff completed with [facility] staff/EMTs.   Patient discharged from hospital unit via stretcher. Stable at time of discharge.

## 2022-05-21 NOTE — Progress Notes (Signed)
PROGRESS NOTE    AMRI LIEN  WUX:[id number] DOB: [birth year] DOA: 05/17/2022 PCP: Cassandria Anger, MD   Brief Narrative:  82 year old with history of HTN, DM 2, CKD stage IIIb, protein calorie malnutrition recently admitted to the hospital about 3 weeks ago with left hip fracture complicated by acute extensive DVT requiring IVC filter placement and discharged on Eliquis to rehab.  She now comes back to the hospital due to worsening of swelling of the left lower extremity.  In the ER ultrasound suggested more proximal occlusion and acute occlusive thrombus in the left common iliac, IVC filter in place infrarenally.  Patient was started on heparin drip and underwent thrombectomy by vascular..  1/10.  Patient reports improvement in swelling.  Per the recommendation of the oncologist patient will be switched to Xarelto 20 mg daily today.  Patient hemoglobin down to 7.9 whereas her baseline is around 9 grams, planning on transfusing 1 unit of packed red blood cell.   Assessment & Plan:  82 year old female with history of hypertension, diabetes mellitus type 2, CKD stage IIIb, protein calorie malnutrition is brought to the emergency department on 05/17/2018 for worsening of the left lower extremity swelling.  The patient had a recent admission for left hip fracture complicated by acute extensive DVT requiring IVC filter placement and was discharged on Eliquis to skilled nursing facility.  In the emergency department lower extremity Dopplers suggested more proximal occlusion of the acute occlusive thrombus in the left common iliac, IVC filter in place infrarenally.  Patient is started on heparin drip, underwent mechanical thrombectomy by vascular surgery, continues to notice improvement with swelling.  Consulted oncology, switch to Xarelto.  Patient's hemoglobin trended down from baseline of 9-7.9.  Acute DVT extensive involving the left iliac vein, common femoral vein -Failed outpatient treatment with  Eliquis -S/p IVC filter placement -S/p mechanical thrombectomy by vascular surgery -Consulted heme-onc-recommended Xarelto 20 mg p.o. daily.  Diabetes mellitus poorly controlled with hyperglycemia -Hemoglobin A1c on 04/22/2022 7.8 -Currently on sliding scale insulin.  Hypertension -Continue the Coreg twice daily   Recent left hip fracture -Continue with physical therapy, Occupational Therapy  Acute blood loss anemia -Get iron profile, B12, folate RBC, reticulocyte count looking into any correctable causes.  Acute on chronic renal failure stage IIIb -Could be from anemia causing ATN -Closely follow-up with the BMP     DVT prophylaxis: Xarelto Code Status: Full code Family Communication: Daughter at bedside  Status is: Inpatient Ongoing evaluation for long-term anticoagulation.  She still needs to work with PT/OT  Subjective:  Patient was seen and examined at bedside today.  Status post thrombectomy yesterday.  Reports improvement in swelling and pain.  Review of labs shows hemoglobin dropped to 7.9.  Examination: Constitutional: Not in acute distress Respiratory: Clear to auscultation bilaterally Cardiovascular: Normal sinus rhythm, no rubs Abdomen: Nontender nondistended good bowel sounds Musculoskeletal: No edema noted Skin: No rashes seen Neurologic: CN 2-12 grossly intact.  And nonfocal Psychiatric: Normal judgment and insight. Alert and oriented x 3. Normal mood.  Objective: Vitals:   05/20/22 2250 05/21/22 0017 05/21/22 0404 05/21/22 0717  BP: (!) 163/78 (!) 173/76 (!) 165/67 (!) 152/78  Pulse: 69 65 65 71  Resp: 13 18 18 18   Temp: 98.6 F (37 C) (!) 97.5 F (36.4 C) 98 F (36.7 C) 98.4 F (36.9 C)  TempSrc: Oral   Oral  SpO2: 100% 100% 100% 100%  Weight:      Height:  Intake/Output Summary (Last 24 hours) at 05/21/2022 0949 Last data filed at 05/21/2022 0717 Gross per 24 hour  Intake 340 ml  Output 1050 ml  Net -710 ml   Filed Weights    05/17/22 2007 05/20/22 0400  Weight: 60.5 kg 58.1 kg     Data Reviewed:   CBC: Recent Labs  Lab 05/17/22 2010 05/19/22 0154 05/20/22 0018 05/21/22 0303  WBC 6.8 6.3 5.4 4.9  NEUTROABS 4.2  --   --   --   HGB 9.2* 8.2* 7.9* 10.3*  HCT 28.8* 25.0* 24.3* 31.6*  MCV 92.6 90.9 91.0 90.5  PLT 269 212 217 202   Basic Metabolic Panel: Recent Labs  Lab 05/17/22 2010 05/19/22 0154 05/20/22 0018 05/21/22 0303  NA 136 137 140 142  K 4.4 4.2 4.4 4.7  CL 106 109 111 109  CO2 20* 22 24 26   GLUCOSE 268* 191* 150* 139*  BUN 26* 21 22 19   CREATININE 1.59* 1.50* 1.61* 1.63*  CALCIUM 9.2 8.7* 8.8* 9.4  MG  --  2.0 2.1 2.3   GFR: Estimated Creatinine Clearance: 20.7 mL/min (A) (by C-G formula based on SCr of 1.63 mg/dL (H)). Liver Function Tests: Recent Labs  Lab 05/17/22 2010  AST 25  ALT 20  ALKPHOS 146*  BILITOT 0.7  PROT 5.9*  ALBUMIN 2.9*   No results for input(s): "LIPASE", "AMYLASE" in the last 168 hours. No results for input(s): "AMMONIA" in the last 168 hours. Coagulation Profile: Recent Labs  Lab 05/18/22 0335  INR 1.6*   Cardiac Enzymes: No results for input(s): "CKTOTAL", "CKMB", "CKMBINDEX", "TROPONINI" in the last 168 hours. BNP (last 3 results) No results for input(s): "PROBNP" in the last 8760 hours. HbA1C: No results for input(s): "HGBA1C" in the last 72 hours. CBG: Recent Labs  Lab 05/20/22 0735 05/20/22 1135 05/20/22 1607 05/20/22 2051 05/21/22 0753  GLUCAP 131* 253* 155* 221* 128*   Lipid Profile: No results for input(s): "CHOL", "HDL", "LDLCALC", "TRIG", "CHOLHDL", "LDLDIRECT" in the last 72 hours. Thyroid Function Tests: No results for input(s): "TSH", "T4TOTAL", "FREET4", "T3FREE", "THYROIDAB" in the last 72 hours. Anemia Panel: No results for input(s): "VITAMINB12", "FOLATE", "FERRITIN", "TIBC", "IRON", "RETICCTPCT" in the last 72 hours. Sepsis Labs: No results for input(s): "PROCALCITON", "LATICACIDVEN" in the last 168  hours.  No results found for this or any previous visit (from the past 240 hour(s)).       Radiology Studies: No results found.      Scheduled Meds:  sodium chloride   Intravenous Once   carvedilol  25 mg Oral BID WC   insulin aspart  0-9 Units Subcutaneous TID AC & HS   pantoprazole  40 mg Oral Daily   rivaroxaban  20 mg Oral Daily   Continuous Infusions:     LOS: 3 days   Time spent= 35 mins    Nyjah Schwake, MD [facility]  If 7PM-7AM, please contact night-coverage  05/21/2022, 9:49 AM

## 2022-05-21 NOTE — TOC Progression Note (Addendum)
Transition of Care [facility]) - Progression Note    Patient Details  Name: Brandy Gonzales MRN: [id number] Date of Birth: 1940-12-09  Transition of Care [facility] CM/SW [place], RN Phone Number: 05/21/2022, 2:33 PM  Clinical Narrative:    1:31pm Retrieved message from [facility] in admissions at facility stating discharge summary has to be updated with medications. MD notified.    2:35pm Updated discharge summary sent to facility in [place]. Rachel Bo notified  by VM and text message of discharge summary sent.    Expected Discharge Plan: [place] Barriers to Discharge: Barriers Resolved  Expected Discharge Plan and Services     Post Acute Care Choice: [place]   Expected Discharge Date: 05/21/22                                     Social Determinants of Health (SDOH) Interventions SDOH Screenings   Food Insecurity: No Food Insecurity (04/25/2022)  Housing: Low Risk  (04/25/2022)  Transportation Needs: No Transportation Needs (04/25/2022)  Utilities: Not At Risk (04/25/2022)  Alcohol Screen: Low Risk  (03/24/2021)  Depression (PHQ2-9): Medium Risk (01/21/2022)  Financial Resource Strain: Low Risk  (03/24/2021)  Physical Activity: Inactive (03/24/2021)  Social Connections: Moderately Isolated (03/24/2021)  Stress: No Stress Concern Present (03/24/2021)  Tobacco Use: Low Risk  (05/19/2022)    Readmission Risk Interventions    05/20/2022    4:35 PM  Readmission Risk Prevention Plan  Transportation Screening Complete  PCP or Specialist Appt within 3-5 Days Complete  Social Work Consult for [place] Planning/Counseling Complete  Palliative Care Screening Not Applicable  Medication Review Press photographer) Complete

## 2022-05-21 NOTE — Progress Notes (Addendum)
Patient discharging via [facility] to [facility], report called at this time to receiving RN Manfred Shirts RN at the facility. [place] RN aware that we are waiting for iron infusion to end and for [facility] to come pick up patient.

## 2022-05-21 NOTE — Consult Note (Addendum)
   [facility] Inpatient Consult   05/21/2022  KADAJAH KJOS 1941/04/01 [id number]  [place] [employer] [ACO] Patient: Medicare ACO REACH  Primary Care Provider:  Cassandria Anger, MD with [place] at [facility] Liaison remote coverage review for patient admitted to [facility]     Patient was reviewed for less than 30 days readmission with medium risk score for unplanned readmission risk.  Chart review reveals patient was Short term rehab at [facility] and plans for return when medically ready.  If the patient goes to a [facility] affiliated facility then, patient can be followed by [facility] PAC RN with traditional Medicare and approved Medicare Advantage plans.    Plan:   Notify [facility] RN who can follow for any known or needs for transitional care needs for returning to post facility care coordination needs to return to community.  For questions or referrals, please contact:   Natividad Brood, RN BSN [place]  [phone] business mobile phone Toll free office [phone]  *[place]  [phone] Fax number: [phone] [email or web address]

## 2022-05-21 NOTE — Evaluation (Signed)
Physical Therapy Evaluation Patient Details Name: Brandy Gonzales MRN: [id number] DOB: 26-Dec-1940 Today's Date: 05/21/2022  History of Present Illness  82 y/o female presented to ED on 05/17/22 from [facility] for L LE swelling. Recent L femur fx 04/26/22. Found to have acute DVT of L iliac vein. S/p mechanical thrombectomy on 1/8. PMH: CKD stage 3b, HTN, T2DM, dementia  Clinical Impression  Patient admitted with the above. PTA, patient from [facility] for rehab after recent discharge from hospital after L femur fx on 04/26/22. Patient unable to provide insight into mobility at rehab due to hx of cognitive impairments. Patient presents with weakness, impaired balance, decreased activity tolerance, and impaired cognition. Requires minA for bed mobility and modA to complete sit to stand and step pivot transfer with RW. Complaining of pain in L LE throughout mobility. Patient will benefit from skilled PT services during acute stay to address listed deficits. Recommend return to SNF to maximize functional mobility and safety.        Recommendations for follow up therapy are one component of a multi-disciplinary discharge planning process, led by the attending physician.  Recommendations may be updated based on patient status, additional functional criteria and insurance authorization.  Follow Up Recommendations Skilled nursing-short term rehab (<3 hours/day) Can patient physically be transported by private vehicle: No    Assistance Recommended at Discharge Frequent or constant Supervision/Assistance  Patient can return home with the following  Two people to help with walking and/or transfers;Assistance with cooking/housework;Direct supervision/assist for medications management;Assist for transportation;Help with stairs or ramp for entrance;A lot of help with bathing/dressing/bathroom    Equipment Recommendations None recommended by PT  Recommendations for Other Services       Functional  Status Assessment Patient has had a recent decline in their functional status and demonstrates the ability to make significant improvements in function in a reasonable and predictable amount of time.     Precautions / Restrictions Precautions Precautions: Fall Restrictions Weight Bearing Restrictions: Yes LLE Weight Bearing: Weight bearing as tolerated Other Position/Activity Restrictions: recent L femur fx on 04/26/22      Mobility  Bed Mobility Overal bed mobility: Needs Assistance Bed Mobility: Supine to Sit     Supine to sit: Min assist, HOB elevated     General bed mobility comments: assist for LLE management out of bed    Transfers Overall transfer level: Needs assistance Equipment used: Rolling Nikea Settle (2 wheels) Transfers: Sit to/from Stand Sit to Stand: Mod assist   Step pivot transfers: Mod assist       General transfer comment: modA to stand from EOB x 2 with cues for hand placement. Assist required for offweighting L LE to advance R LE during transfer and for RW management.    Ambulation/Gait                  Stairs            Wheelchair Mobility    Modified Rankin (Stroke Patients Only)       Balance Overall balance assessment: History of Falls, Needs assistance Sitting-balance support: Feet supported, No upper extremity supported Sitting balance-Leahy Scale: Fair     Standing balance support: Reliant on assistive device for balance, Bilateral upper extremity supported, During functional activity Standing balance-Leahy Scale: Poor                               Pertinent Vitals/Pain Pain Assessment Pain Assessment:  Faces Faces Pain Scale: Hurts even more Pain Location: L LE Pain Descriptors / Indicators: Aching, Grimacing, Guarding Pain Intervention(s): Monitored during session, Repositioned    Home Living Family/patient expects to be discharged to:: Skilled nursing facility                         Prior Function Prior Level of Function : Needs assist             Mobility Comments: Unable to obtain current level of function at rehab but seems to be limited by pain at this time but utilizing RW for mobility with PT       Hand Dominance        Extremity/Trunk Assessment   Upper Extremity Assessment Upper Extremity Assessment: Generalized weakness    Lower Extremity Assessment Lower Extremity Assessment: Generalized weakness    Cervical / Trunk Assessment Cervical / Trunk Assessment: Kyphotic  Communication   Communication: No difficulties  Cognition Arousal/Alertness: Awake/alert Behavior During Therapy: Flat affect Overall Cognitive Status: History of cognitive impairments - at baseline                                 General Comments: hx of dementia. Unable to recall how she was mobilizing prior to admission        General Comments      Exercises     Assessment/Plan    PT Assessment Patient needs continued PT services  PT Problem List Decreased strength;Decreased mobility;Decreased activity tolerance;Decreased balance;Decreased knowledge of use of DME;Pain;Decreased cognition;Decreased range of motion       PT Treatment Interventions DME instruction;Balance training;Functional mobility training;Therapeutic exercise;Patient/family education;Therapeutic activities;Gait training    PT Goals (Current goals can be found in the Care Plan section)  Acute Rehab PT Goals Patient Stated Goal: did not state PT Goal Formulation: Patient unable to participate in goal setting Time For Goal Achievement: 06/04/22 Potential to Achieve Goals: Fair    Frequency Min 2X/week     Co-evaluation               AM-PAC PT "6 Clicks" Mobility  Outcome Measure Help needed turning from your back to your side while in a flat bed without using bedrails?: A Little Help needed moving from lying on your back to sitting on the side of a flat bed without  using bedrails?: A Little Help needed moving to and from a bed to a chair (including a wheelchair)?: A Lot Help needed standing up from a chair using your arms (e.g., wheelchair or bedside chair)?: A Lot Help needed to walk in hospital room?: Total Help needed climbing 3-5 steps with a railing? : Total 6 Click Score: 12    End of Session   Activity Tolerance: Patient tolerated treatment well Patient left: in chair;with call bell/phone within reach;with chair alarm set Nurse Communication: Mobility status PT Visit Diagnosis: Other abnormalities of gait and mobility (R26.89);Muscle weakness (generalized) (M62.81);History of falling (Z91.81)    Time: 8889-1694 PT Time Calculation (min) (ACUTE ONLY): 15 min   Charges:   PT Evaluation $PT Eval Moderate Complexity: 1 Mod          Janice Bodine A. Gilford Rile PT, DPT [facility] - Acute Rehabilitation Services   [place] Pierron A Kotaro Buer 05/21/2022, 9:56 AM

## 2022-05-21 NOTE — TOC Transition Note (Signed)
Transition of Care [facility]) - CM/SW Discharge Note   Patient Details  Name: Brandy Gonzales MRN: [id number] Date of Birth: Oct 09, 1940  [facility]) CM/SW Contact:  Laurena Slimmer, RN Phone Number: 05/21/2022, 11:50 AM   Clinical Narrative:    Discharge order received.  Contacted Magda Paganini at [facility] to confirm patient can be received today. Patient assigned to room 511. Nurse will call report to  [phone] Contacted patient's daughter to advise of discharge Face sheet and medical necessity forms completed and printed to the floor to be added to EMS packet.  EMS arranged.  Discharge summary and transfer summary sent to the faility in the [place].   TOC signing off.       Barriers to Discharge: Continued Medical Work up   Patient Goals and CMS Choice      Discharge Placement                         Discharge Plan and Services Additional resources added to the After Visit Summary for       Post Acute Care Choice: [place]                               Social Determinants of Health (SDOH) Interventions SDOH Screenings   Food Insecurity: No Food Insecurity (04/25/2022)  Housing: Low Risk  (04/25/2022)  Transportation Needs: No Transportation Needs (04/25/2022)  Utilities: Not At Risk (04/25/2022)  Alcohol Screen: Low Risk  (03/24/2021)  Depression (PHQ2-9): Medium Risk (01/21/2022)  Financial Resource Strain: Low Risk  (03/24/2021)  Physical Activity: Inactive (03/24/2021)  Social Connections: Moderately Isolated (03/24/2021)  Stress: No Stress Concern Present (03/24/2021)  Tobacco Use: Low Risk  (05/19/2022)     Readmission Risk Interventions    05/20/2022    4:35 PM  Readmission Risk Prevention Plan  Transportation Screening Complete  PCP or Specialist Appt within 3-5 Days Complete  Social Work Consult for [place] Planning/Counseling Complete  Palliative Care Screening Not Applicable  Medication Review Designer, fashion/clothing) Complete

## 2022-05-21 NOTE — Discharge Summary (Signed)
Physician Discharge Summary   Patient: Brandy Gonzales MRN: [id number] DOB: 1940-09-02  Admit date:     05/17/2022  Discharge date: 05/21/22  Discharge Physician: [id number]   PCP: Cassandria Anger, MD   Recommendations at discharge:   Follow-up with primary care physician in 1 week Follow-up with orthopedic surgery in 1 to 2 weeks  Discharge Diagnoses: Principal Problem:   Acute deep vein thrombosis of left iliac vein (HCC) Active Problems:   Current long-term use of anticoagulant medication with history of deep venous thrombosis 04/2022 (DVT)   History of insertion of IVC (inferior vena caval) filter 2006   Uncontrolled type 2 diabetes mellitus with hyperglycemia, without long-term current use of insulin (HCC)   Essential hypertension   Stage 3b chronic kidney disease (CKD) (HCC)   Left leg swelling  Resolved Problems:   * No resolved hospital problems. [facility] Course: No notes on file  Assessment and Plan: 82 year old female with history of hypertension, diabetes mellitus type 2, CKD stage IIIb, protein calorie malnutrition is brought to the emergency department on 05/17/2018 for worsening of the left lower extremity swelling.  The patient had a recent admission for left hip fracture complicated by acute extensive DVT requiring IVC filter placement and was discharged on Eliquis to skilled nursing facility.  In the emergency department lower extremity Dopplers suggested more proximal occlusion of the acute occlusive thrombus in the left common iliac, IVC filter in place infrarenally.  Patient is started on heparin drip, underwent mechanical thrombectomy by vascular surgery, continues to notice improvement with swelling.  Consulted oncology, switch to Xarelto.  Patient's hemoglobin trended down from baseline of 9-7.9.   Acute DVT extensive involving the left iliac vein, common femoral vein -Failed outpatient treatment with Eliquis -S/p IVC filter placement -S/p  mechanical thrombectomy by vascular surgery -Consulted heme-onc-recommended Xarelto 20 mg p.o. daily.   Diabetes mellitus poorly controlled with hyperglycemia -Hemoglobin A1c on 04/22/2022 7.8 -Currently on sliding scale insulin.   Hypertension -Continue the Coreg twice daily     Recent left hip fracture -Continue with physical therapy, Occupational Therapy   Acute blood loss anemia -Get iron profile, B12, folate RBC, reticulocyte count looking into any correctable causes.   Acute on chronic renal failure stage IIIb -Could be from anemia causing ATN -Closely follow-up with the BMP   Consultants: Vascular surgery, heme-onc Procedures performed: Mechanical thrombectomy Disposition: Skilled nursing facility Diet recommendation:  Discharge Diet Orders (From admission, onward)     Start     Ordered   05/21/22 0000  Diet - low sodium heart healthy        05/21/22 1115           Cardiac and Carb modified diet DISCHARGE MEDICATION:   Discharge Exam: [place] [facility]ights   05/17/22 2007 05/20/22 0400  Weight: 60.5 kg 58.1 kg     Condition at discharge: stable  The results of significant diagnostics from this hospitalization (including imaging, microbiology, ancillary and laboratory) are listed below for reference.   Imaging Studies: PERIPHERAL VASCULAR CATHETERIZATION  Result Date: 05/18/2022 See surgical note for result.  CT VENOGRAM ABD/PELVIS/LOWER EXT BILAT  Result Date: 05/18/2022 CLINICAL DATA:  Concern for central obstruction on ultrasound today EXAM: CT VENOGRAM ABDOMEN AND PELVIS AND LOWER EXTREMITY BILATERAL TECHNIQUE: RADIATION DOSE REDUCTION: This exam was performed according to the departmental dose-optimization program which includes automated exposure control, adjustment of the mA and/or kV according to patient size and/or use of iterative reconstruction technique. CONTRAST:  154mL OMNIPAQUE  IOHEXOL 350 MG/ML SOLN COMPARISON:  None Available. FINDINGS: Lower  chest: Bibasilar atelectasis/scarring. 1.8 cm triangular consolidation in the right lower lobe posteriorly is not significantly changed from 01/22/2011. No acute abnormality. Hepatobiliary: Intrahepatic biliary dilation greatest in the right hepatic lobe is similar to slightly increased from 2012. Unremarkable gallbladder. No dilation of the common bile duct. No suspicious focal hepatic lesion. Pancreas: Mild prominence of the pancreatic duct in the pancreatic head measuring 5 mm. No evidence of acute inflammation. Spleen: Unremarkable. Adrenals/Urinary Tract: Normal adrenal glands. Low-attenuation lesions in the kidneys are statistically likely to represent cysts. No follow-up is required. Bilateral cortical renal scarring. Urinary calculi or hydronephrosis. Unremarkable bladder. Stomach/Bowel: Colonic diverticulosis without diverticulitis. Normal caliber large and small bowel. Normal appendix. Unremarkable stomach. Arterial/lymphatic: Aortic atherosclerosis. No enlarged abdominal or pelvic lymph nodes. Reproductive: Hysterectomy.  No adnexal mass. Other: Small amount of free fluid in the pelvis. No free intraperitoneal air. Musculoskeletal: IM rod and screw fixations of the left femur and proximal tibia. No acute fracture. Body wall edema. IVC: IVC filter. The IVC is patent. No thrombus or stenosis in the IVC. Portal and mesenteric veins: No evidence for thrombus or stenosis. Bilateral iliac veins: Expansile occlusive thrombus in the proximal left common iliac vein extending into the left external iliac vein and common femoral vein. The right iliac veins are patent. Right lower extremity: No evidence for thrombus involving the common femoral, and femoral vein. Popliteal and visualized deep calf veins Left lower extremity: Nonocclusive thrombus is visualized within the common femoral and femoral vein. No definite clot in the popliteal or visualized deep calf veins. IMPRESSION: Acute occlusive thrombus in the left  common iliac vein. Nonocclusive thrombus extends into the left external iliac, common femoral and superficial femoral veins. Findings are suggestive of May-Thurner syndrome. IVC filter in the infrarenal IVC. These results were called by telephone at the time of interpretation on 05/18/2022 at 1:51 am to provider [facility] , who verbally acknowledged these results. Electronically Signed   By: Placido Sou M.D.   On: 05/18/2022 01:53   US Venous Img Lower Unilateral Left  Result Date: 05/17/2022 CLINICAL DATA:  DVT, progressive left lower extremity swelling EXAM: LEFT LOWER EXTREMITY VENOUS DOPPLER ULTRASOUND TECHNIQUE: Gray-scale sonography with compression, as well as color and duplex ultrasound, were performed to evaluate the deep venous system(s) from the level of the common femoral vein through the popliteal and proximal calf veins. COMPARISON:  None Available. FINDINGS: VENOUS Normal compressibility of the common femoral, superficial femoral, and popliteal veins, as well as the visualized calf veins. Visualized portions of profunda femoral vein and great saphenous vein unremarkable. No filling defects to suggest DVT on grayscale or color Doppler imaging. Doppler waveforms show normal direction of venous flow and response to augmentation. There is, however, lack of respiratory variation within the left common femoral vein which suggests a more central obstruction. Limited views of the contralateral common femoral vein are unremarkable. OTHER None. Limitations: none IMPRESSION: 1. No evidence of femoropopliteal DVT within the left lower extremity. There is, however, lack of respiratory variation within the left common femoral vein which suggests a more central obstruction. If indicated, this would be better assessed with CT venography of the abdomen and pelvis. Electronically Signed   By: Fidela Salisbury M.D.   On: 05/17/2022 23:20   VAS [place] LOWER EXTREMITY VENOUS (DVT)  Result Date: 05/05/2022  Lower  Venous DVT Study Patient Name:  Brandy Gonzales  Date of Exam:   05/04/2022  Medical Rec #: [id number]     Accession #:    [id number] Date of Birth: 04-14-1941     Patient Gender: F Patient Age:   70 years Exam Location:  [facility] Procedure:      VAS [place] LOWER EXTREMITY VENOUS (DVT) Referring Phys: RIPUDEEP RAI --------------------------------------------------------------------------------  Indications: Swelling. Other Indications: S/P LT ORIF (femur fracture) on 12/17 - immobility. Comparison Study: No previous Performing Technologist: Jody Hill RVT, RDMS  Examination Guidelines: A complete evaluation includes B-mode imaging, spectral Doppler, color Doppler, and power Doppler as needed of all accessible portions of each vessel. Bilateral testing is considered an integral part of a complete examination. Limited examinations for reoccurring indications may be performed as noted. The reflux portion of the exam is performed with the patient in reverse Trendelenburg.  +---------+---------------+---------+-----------+----------+--------------+ RIGHT    CompressibilityPhasicitySpontaneityPropertiesThrombus Aging +---------+---------------+---------+-----------+----------+--------------+ CFV      Full           Yes      Yes                                 +---------+---------------+---------+-----------+----------+--------------+ SFJ      Full                                                        +---------+---------------+---------+-----------+----------+--------------+ FV Prox  Full           Yes      Yes                                 +---------+---------------+---------+-----------+----------+--------------+ FV Mid   Full           Yes      Yes                                 +---------+---------------+---------+-----------+----------+--------------+ FV DistalFull           Yes      Yes                                  +---------+---------------+---------+-----------+----------+--------------+ PFV      Full                                                        +---------+---------------+---------+-----------+----------+--------------+ POP      Full           Yes      Yes                                 +---------+---------------+---------+-----------+----------+--------------+ PTV      Full                                                        +---------+---------------+---------+-----------+----------+--------------+  PERO     Full                                                        +---------+---------------+---------+-----------+----------+--------------+   +---------+---------------+---------+-----------+----------+--------------+ LEFT     CompressibilityPhasicitySpontaneityPropertiesThrombus Aging +---------+---------------+---------+-----------+----------+--------------+ CFV      None           No       Yes                  Acute          +---------+---------------+---------+-----------+----------+--------------+ SFJ      None                                         Acute          +---------+---------------+---------+-----------+----------+--------------+ FV Prox  Partial        No       No                   Acute          +---------+---------------+---------+-----------+----------+--------------+ FV Mid   None           No       No                   Acute          +---------+---------------+---------+-----------+----------+--------------+ FV DistalNone           No       No                   Acute          +---------+---------------+---------+-----------+----------+--------------+ PFV      None           No       No                   Acute          +---------+---------------+---------+-----------+----------+--------------+ POP      None           No       No                   Acute           +---------+---------------+---------+-----------+----------+--------------+ PTV      Full                                                        +---------+---------------+---------+-----------+----------+--------------+ PERO                                                  Not visualized +---------+---------------+---------+-----------+----------+--------------+     Summary: BILATERAL: -No evidence of popliteal cyst, bilaterally. RIGHT: - There is no evidence of deep vein thrombosis in the lower extremity.  LEFT: - Findings consistent with acute deep vein thrombosis involving the left common femoral vein,  SF junction, left femoral vein, left proximal profunda vein, and left popliteal vein.  *See table(s) above for measurements and observations. Electronically signed by Deitra Mayo MD on 05/05/2022 at 4:30:58 AM.    Final    VAS [place] IVC/ILIAC (VENOUS ONLY)  Result Date: 05/05/2022 IVC/ILIAC STUDY Patient Name:  Brandy Gonzales  Date of Exam:   05/04/2022 Medical Rec #: [id number]     Accession #:    [id number] Date of Birth: 1940-10-05     Patient Gender: F Patient Age:   6 years Exam Location:  [facility] Procedure:      VAS [place] IVC/ILIAC (VENOUS ONLY) Referring Phys: Georgeanna Harrison --------------------------------------------------------------------------------  Indications: DVT LLE Limitations: Air/bowel gas and poor tissue/ultrasound interface.  Comparison Study: No previous exams Performing Technologist: Jody Hill RVT, RDMS  Examination Guidelines: A complete evaluation includes B-mode imaging, spectral Doppler, color Doppler, and power Doppler as needed of all accessible portions of each vessel. Bilateral testing is considered an integral part of a complete examination. Limited examinations for reoccurring indications may be performed as noted.  IVC/Iliac Findings: +----------+------+--------+--------+    IVC    PatentThrombusComments +----------+------+--------+--------+ IVC  Prox  patent                 +----------+------+--------+--------+ IVC Mid   patent                 +----------+------+--------+--------+ IVC Distalpatent                 +----------+------+--------+--------+  +------------------+---------+-----------+---------+-----------+---------------+        CIV        RT-PatentRT-ThrombusLT-PatentLT-Thrombus   Comments     +------------------+---------+-----------+---------+-----------+---------------+ Common Iliac Prox                                 acute                   +------------------+---------+-----------+---------+-----------+---------------+ Common Iliac Mid                                  acute       partial                                                                  thrombus     +------------------+---------+-----------+---------+-----------+---------------+ Common Iliac                                              not visualized  Distal                                                                    +------------------+---------+-----------+---------+-----------+---------------+  +-------------------------+---------+-----------+---------+-----------+--------+            EIV  RT-PatentRT-ThrombusLT-PatentLT-ThrombusComments +-------------------------+---------+-----------+---------+-----------+--------+ External Iliac Vein Prox                                 acute            +-------------------------+---------+-----------+---------+-----------+--------+ External Iliac Vein Mid                                  acute            +-------------------------+---------+-----------+---------+-----------+--------+ External Iliac Vein                                      acute            Distal                                                                    +-------------------------+---------+-----------+---------+-----------+--------+   Summary: IVC/Iliac:  There is no evidence of thrombus involving the IVC. There is evidence of acute thrombus involving the left common iliac vein. There is evidence of acute thrombus involving the left external iliac vein. . Distal portion of left common iliac vein not visualized on this exam.  *See table(s) above for measurements and observations.  Electronically signed by Deitra Mayo MD on 05/05/2022 at 4:29:52 AM.    Final    MR BRAIN WO CONTRAST  Result Date: 04/27/2022 CLINICAL DATA:  Altered mental status EXAM: MRI HEAD WITHOUT CONTRAST TECHNIQUE: Multiplanar, multiecho pulse sequences of the brain and surrounding structures were obtained without intravenous contrast. COMPARISON:  Same-day CT/CTA head and neck, brain MRI 10/17/2021 FINDINGS: Image quality is intermittently motion degraded. Specifically, the axial FLAIR sequence is moderately motion degraded. Brain: There is no evidence of acute intracranial hemorrhage, extra-axial fluid collection, or acute territorial infarct. Tiny foci of SWI signal abnormality in the high right frontoparietal region are favored artifactual. There is unchanged parenchymal volume loss with prominence of the ventricular system and extra-axial CSF spaces. There is extensive patchy and confluence FLAIR signal abnormality throughout the supratentorial brain consistent with advanced chronic small-vessel ischemic change. Encephalomalacia in the left frontal lobe consistent with prior infarct is unchanged. Small remote infarcts in the bilateral cerebellar hemispheres are unchanged. Old hemorrhage in the bilateral parietal lobes is unchanged likely associated with prior infarcts There is no mass lesion.  There is no mass effect or midline shift. Vascular: Normal flow voids. Skull and upper cervical spine: Normal marrow signal. Sinuses/Orbits: The paranasal sinuses are clear. Bilateral lens implants are in place. The globes and orbits are otherwise unremarkable. Other: None. IMPRESSION: 1. No  definite evidence of acute intracranial pathology, within the confines of motion degraded images. 2. Stable chronic findings as above. Electronically Signed   By: Valetta Mole M.D.   On: 04/27/2022 21:37   EEG adult  Result Date: 04/27/2022 Lora Havens, MD     04/27/2022  2:48 PM Patient Name: Brandy Gonzales MRN: [id number] Epilepsy Attending: Lora Havens Referring Physician/Provider: Donnetta Simpers, MD Date: 04/27/2022 Duration: 22.03 mins Patient history: 82 year old female with presyncopal episode this morning now  with aphasia.  EEG to evaluate for seizure. Level of alertness: Awake AEDs during EEG study: None Technical aspects: This EEG study was done with scalp electrodes positioned according to the 10-20 International system of electrode placement. Electrical activity was reviewed with band pass filter of 1-70Hz , sensitivity of 7 uV/mm, display speed of 37mm/sec with a 60Hz  notched filter applied as appropriate. EEG data were recorded continuously and digitally stored.  Video monitoring was available and reviewed as appropriate. Description: No clear posterior dominant rhythm was seen.  EEG showed continuous generalized predominantly 5 to 7 Hz theta slowing admixed with intermittent generalized 2 to 3 Hz delta slowing. Hyperventilation and photic stimulation were not performed.   ABNORMALITY - Continuous slow, generalized IMPRESSION: This study is suggestive of moderate diffuse encephalopathy, nonspecific etiology. No seizures or epileptiform discharges were seen throughout the recording. Priyanka Barbra Sarks   CT ANGIO HEAD NECK W WO CM (CODE STROKE)  Result Date: 04/27/2022 CLINICAL DATA:  Aphasia out of proportion to Encephalopathy. EXAM: CT ANGIOGRAPHY HEAD AND NECK TECHNIQUE: Multidetector CT imaging of the head and neck was performed using the standard protocol during bolus administration of intravenous contrast. Multiplanar CT image reconstructions and MIPs were obtained to evaluate  the vascular anatomy. Carotid stenosis measurements (when applicable) are obtained utilizing NASCET criteria, using the distal internal carotid diameter as the denominator. RADIATION DOSE REDUCTION: This exam was performed according to the departmental dose-optimization program which includes automated exposure control, adjustment of the mA and/or kV according to patient size and/or use of iterative reconstruction technique. CONTRAST:  43mL OMNIPAQUE IOHEXOL 350 MG/ML SOLN COMPARISON:  Head CT earlier same day.  MRI 11/16/2021 FINDINGS: CTA NECK FINDINGS Aortic arch: Aortic atherosclerosis. Branching pattern is normal without origin stenosis. Right carotid system: Common carotid artery widely patent to the bifurcation. Calcified plaque at the carotid bifurcation and ICA bulb but no stenosis. Cervical ICA widely patent. Left carotid system: Common carotid artery widely patent to the bifurcation. No plaque at the bifurcation or ICA bulb. No ICA stenosis. Vertebral arteries: Both vertebral arteries widely patent at their origins and through the cervical region to the foramen magnum. Skeleton: Chronic cervical spondylosis. Other neck: No mass or lymphadenopathy. Upper chest: Normal Review of the MIP images confirms the above findings CTA HEAD FINDINGS Anterior circulation: Both internal carotid arteries are patent through the skull base and siphon regions. There is ordinary siphon atherosclerotic calcification but no stenosis greater than 30-50%. The anterior and middle cerebral vessels are patent. No large vessel occlusion or proximal stenosis. No aneurysm or vascular malformation. Posterior circulation: Both vertebral arteries widely patent to the basilar. No basilar stenosis. Posterior circulation branch vessels are normal. Venous sinuses: Patent and normal. Anatomic variants: None significant. Review of the MIP images confirms the above findings IMPRESSION: 1. No large vessel occlusion or proximal stenosis. 2.  Aortic atherosclerosis. 3. Atherosclerotic change at the right carotid bifurcation but no stenosis. 4. Atherosclerotic disease in both carotid siphon regions but without stenosis greater than 30-50% suspected. Aortic Atherosclerosis (ICD10-I70.0). Electronically Signed   By: Nelson Chimes M.D.   On: 04/27/2022 13:10   CT HEAD CODE STROKE WO CONTRAST  Result Date: 04/27/2022 CLINICAL DATA:  Code stroke. Neuro deficit, acute, stroke suspected. EXAM: CT HEAD WITHOUT CONTRAST TECHNIQUE: Contiguous axial images were obtained from the base of the skull through the vertex without intravenous contrast. RADIATION DOSE REDUCTION: This exam was performed according to the departmental dose-optimization program which includes automated exposure control, adjustment of the mA and/or  kV according to patient size and/or use of iterative reconstruction technique. COMPARISON:  11/16/2021 FINDINGS: Brain: Generalized atrophy. No focal abnormality seen affecting the brainstem or cerebellum. Extensive chronic appearing small vessel ischemic changes of the cerebral hemispheric white matter. No sign of acute large vessel territory stroke. No mass lesion, hemorrhage, hydrocephalus or extra-axial collection. Vascular: There is atherosclerotic calcification of the major vessels at the base of the brain. Skull: Negative Sinuses/Orbits: Clear/normal Other: None ASPECTS ([place] Stroke Program Early CT Score) - Ganglionic level infarction (caudate, lentiform nuclei, internal capsule, insula, M1-M3 cortex): 7 - Supraganglionic infarction (M4-M6 cortex): 3 Total score (0-10 with 10 being normal): 10 IMPRESSION: 1. No acute CT finding. Atrophy and extensive chronic small-vessel ischemic changes of the white matter. 2. Aspects is 10. These results were communicated to Dr. Lorrin Goodell at 12:54 pm on 04/27/2022 by text page via the [facility] messaging system. Electronically Signed   By: Nelson Chimes M.D.   On: 04/27/2022 12:55   DG FEMUR MIN 2 VIEWS  LEFT  Result Date: 04/26/2022 CLINICAL DATA:  Left hip ORIF EXAM: LEFT FEMUR 2 VIEWS COMPARISON:  04/25/2022 FINDINGS: Interval postsurgical changes from ORIF of intertrochanteric left femur fracture with long intramedullary rod, proximal lag screw, and distal interlocking screws. Anatomic alignment at the fracture site. No new fractures. No malalignment. Expected postoperative changes within the overlying soft tissues. IMPRESSION: Interval ORIF of intertrochanteric left femur fracture. No postoperative complications are evident. Electronically Signed   By: Davina Poke D.O.   On: 04/26/2022 14:11   DG FEMUR MIN 2 VIEWS LEFT  Result Date: 04/26/2022 CLINICAL DATA:  Left hip surgery EXAM: LEFT FEMUR 2 VIEWS COMPARISON:  04/25/2022 FINDINGS: Six C-arm fluoroscopic images were obtained intraoperatively and submitted for post operative interpretation. Images demonstrate placement of ORIF hardware traversing proximal left femur fracture with improved alignment. 105 seconds fluoroscopy time utilized. Radiation dose: 6.36 mGy. Please see the performing provider's procedural report for further detail. IMPRESSION: Intraoperative fluoroscopy provided for left hip ORIF. Electronically Signed   By: Davina Poke D.O.   On: 04/26/2022 13:31   DG C-Arm 1-60 Min-No Report  Result Date: 04/26/2022 Fluoroscopy was utilized by the requesting physician.  No radiographic interpretation.   DG Femur Min 2 Views Left  Result Date: 04/25/2022 CLINICAL DATA:  Left hip fracture. EXAM: LEFT FEMUR 2 VIEWS COMPARISON:  Earlier same day hip radiograph at 1915 hours FINDINGS: Comminuted intertrochanteric fracture, better visualized on earlier same day radiograph secondary to overlying soft tissues. No other acute fracture of the left femur. Partially visualized intramedullary rod with proximal locking screws in the tibia. Small ossification noted adjacent to the medial femoral condyle, likely sequela from prior trauma. No  suprapatellar knee joint effusion. Vascular calcifications are seen in the left thigh and left lower leg. IMPRESSION: Acute comminuted intertrochanteric fracture, better visualized on earlier same day hip radiograph due to overlying soft tissues. No other acute fractures of the left femur. Electronically Signed   By: Ileana Roup M.D.   On: 04/25/2022 20:38   DG Chest 1 View  Result Date: 04/25/2022 CLINICAL DATA:  Fall EXAM: CHEST  1 VIEW COMPARISON:  Chest x-ray 09/27/2021 FINDINGS: The heart size and mediastinal contours are within normal limits. Both lungs are clear. The visualized skeletal structures are unremarkable. IMPRESSION: No active disease. Electronically Signed   By: Ronney Asters M.D.   On: 04/25/2022 19:49   DG Hip Unilat W or Wo Pelvis 2-3 Views Left  Result Date: 04/25/2022 CLINICAL DATA:  Hip pain, fall. EXAM: DG HIP (WITH OR WITHOUT PELVIS) 2-3V LEFT COMPARISON:  None. FINDINGS: There is a comminuted left femoral intratrochanteric fracture with apex lateral angulation and overlying soft tissue swelling. There is no dislocation identified. IMPRESSION: Comminuted left femoral intratrochanteric fracture. Electronically Signed   By: Ronney Asters M.D.   On: 04/25/2022 19:44    Microbiology: Results for orders placed or performed during the hospital encounter of 04/25/22  Surgical PCR screen     Status: None   Collection Time: 04/25/22 11:56 PM   Specimen: Nasal Mucosa; Nasal Swab  Result Value Ref Range Status   MRSA, PCR NEGATIVE NEGATIVE Final   Staphylococcus aureus NEGATIVE NEGATIVE Final    Comment: (NOTE) The Xpert SA Assay (FDA approved for NASAL specimens in patients 27 years of age and older), is one component of a comprehensive surveillance program. It is not intended to diagnose infection nor to guide or monitor treatment. Performed at [facility], [place] [address] [place], [place] [address]   Respiratory (~20 pathogens) panel by PCR     Status: Abnormal    Collection Time: 05/01/22  9:03 AM   Specimen: Nasopharyngeal Swab; Respiratory  Result Value Ref Range Status   Adenovirus NOT DETECTED NOT DETECTED Final   Coronavirus 229E NOT DETECTED NOT DETECTED Final    Comment: (NOTE) The Coronavirus on the Respiratory Panel, DOES NOT test for the novel  Coronavirus (2019 nCoV)    Coronavirus HKU1 NOT DETECTED NOT DETECTED Final   Coronavirus NL63 NOT DETECTED NOT DETECTED Final   Coronavirus OC43 NOT DETECTED NOT DETECTED Final   Metapneumovirus NOT DETECTED NOT DETECTED Final   Rhinovirus / Enterovirus NOT DETECTED NOT DETECTED Final   Influenza A H1 2009 DETECTED (A) NOT DETECTED Final   Influenza B NOT DETECTED NOT DETECTED Final   Parainfluenza Virus 1 NOT DETECTED NOT DETECTED Final   Parainfluenza Virus 2 NOT DETECTED NOT DETECTED Final   Parainfluenza Virus 3 NOT DETECTED NOT DETECTED Final   Parainfluenza Virus 4 NOT DETECTED NOT DETECTED Final   Respiratory Syncytial Virus NOT DETECTED NOT DETECTED Final   Bordetella pertussis NOT DETECTED NOT DETECTED Final   Bordetella Parapertussis NOT DETECTED NOT DETECTED Final   Chlamydophila pneumoniae NOT DETECTED NOT DETECTED Final   Mycoplasma pneumoniae NOT DETECTED NOT DETECTED Final    Comment: Performed at [facility], [place]. [address], [place], [place] [address]  Resp panel by RT-PCR (RSV, Flu A&B, Covid) Anterior Nasal Swab     Status: Abnormal   Collection Time: 05/01/22  9:05 AM   Specimen: Anterior Nasal Swab  Result Value Ref Range Status   SARS Coronavirus 2 by RT PCR NEGATIVE NEGATIVE Final    Comment: (NOTE) SARS-CoV-2 target nucleic acids are NOT DETECTED.  The SARS-CoV-2 RNA is generally detectable in upper respiratory specimens during the acute phase of infection. The lowest concentration of SARS-CoV-2 viral copies this assay can detect is 138 copies/mL. A negative result does not preclude SARS-Cov-2 infection and should not be used as the sole basis for  treatment or other patient management decisions. A negative result may occur with  improper specimen collection/handling, submission of specimen other than nasopharyngeal swab, presence of viral mutation(s) within the areas targeted by this assay, and inadequate number of viral copies(<138 copies/mL). A negative result must be combined with clinical observations, patient history, and epidemiological information. The expected result is Negative.  Fact Sheet for Patients:  EntrepreneurPulse.com.au  Fact Sheet for Healthcare Providers:  IncredibleEmployment.be  This test is no t yet approved or cleared by the [place] and  has been authorized for detection and/or diagnosis of SARS-CoV-2 by FDA under an Emergency Use Authorization (EUA). This EUA will remain  in effect (meaning this test can be used) for the duration of the COVID-19 declaration under Section 564(b)(1) of the Act, 21 U.S.C.section 360bbb-3(b)(1), unless the authorization is terminated  or revoked sooner.       Influenza A by PCR POSITIVE (A) NEGATIVE Final   Influenza B by PCR NEGATIVE NEGATIVE Final    Comment: (NOTE) The Xpert Xpress SARS-CoV-2/FLU/RSV plus assay is intended as an aid in the diagnosis of influenza from Nasopharyngeal swab specimens and should not be used as a sole basis for treatment. Nasal washings and aspirates are unacceptable for Xpert Xpress SARS-CoV-2/FLU/RSV testing.  Fact Sheet for Patients: EntrepreneurPulse.com.au  Fact Sheet for Healthcare Providers: IncredibleEmployment.be  This test is not yet approved or cleared by the [place] FDA and has been authorized for detection and/or diagnosis of SARS-CoV-2 by FDA under an Emergency Use Authorization (EUA). This EUA will remain in effect (meaning this test can be used) for the duration of the COVID-19 declaration under Section 564(b)(1) of the Act, 21  U.S.C. section 360bbb-3(b)(1), unless the authorization is terminated or revoked.     Resp Syncytial Virus by PCR NEGATIVE NEGATIVE Final    Comment: (NOTE) Fact Sheet for Patients: EntrepreneurPulse.com.au  Fact Sheet for Healthcare Providers: IncredibleEmployment.be  This test is not yet approved or cleared by the [place] FDA and has been authorized for detection and/or diagnosis of SARS-CoV-2 by FDA under an Emergency Use Authorization (EUA). This EUA will remain in effect (meaning this test can be used) for the duration of the COVID-19 declaration under Section 564(b)(1) of the Act, 21 U.S.C. section 360bbb-3(b)(1), unless the authorization is terminated or revoked.  Performed at [facility], [place] [address] [place], [place] [address]     Labs: CBC: Recent Labs  Lab 05/17/22 2010 05/19/22 0154 05/20/22 0018 05/21/22 0303  WBC 6.8 6.3 5.4 4.9  NEUTROABS 4.2  --   --   --   HGB 9.2* 8.2* 7.9* 10.3*  HCT 28.8* 25.0* 24.3* 31.6*  MCV 92.6 90.9 91.0 90.5  PLT 269 212 217 562   Basic Metabolic Panel: Recent Labs  Lab 05/17/22 2010 05/19/22 0154 05/20/22 0018 05/21/22 0303  NA 136 137 140 142  K 4.4 4.2 4.4 4.7  CL 106 109 111 109  CO2 20* 22 24 26   GLUCOSE 268* 191* 150* 139*  BUN 26* 21 22 19   CREATININE 1.59* 1.50* 1.61* 1.63*  CALCIUM 9.2 8.7* 8.8* 9.4  MG  --  2.0 2.1 2.3   Liver Function Tests: Recent Labs  Lab 05/17/22 2010  AST 25  ALT 20  ALKPHOS 146*  BILITOT 0.7  PROT 5.9*  ALBUMIN 2.9*   CBG: Recent Labs  Lab 05/20/22 1135 05/20/22 1607 05/20/22 2051 05/21/22 0753 05/21/22 1107  GLUCAP 253* 155* 221* 128* 259*    Discharge time spent: greater than 30 minutes.  SignedMonica Becton, MD [facility] 05/21/2022

## 2022-05-26 DIAGNOSIS — I110 Hypertensive heart disease with heart failure: Secondary | ICD-10-CM | POA: Diagnosis not present

## 2022-05-26 DIAGNOSIS — S79192S Other physeal fracture of lower end of left femur, sequela: Secondary | ICD-10-CM | POA: Diagnosis not present

## 2022-05-26 DIAGNOSIS — K219 Gastro-esophageal reflux disease without esophagitis: Secondary | ICD-10-CM | POA: Diagnosis not present

## 2022-05-26 DIAGNOSIS — E1122 Type 2 diabetes mellitus with diabetic chronic kidney disease: Secondary | ICD-10-CM | POA: Diagnosis not present

## 2022-05-26 DIAGNOSIS — N1832 Chronic kidney disease, stage 3b: Secondary | ICD-10-CM | POA: Diagnosis not present

## 2022-05-26 DIAGNOSIS — D500 Iron deficiency anemia secondary to blood loss (chronic): Secondary | ICD-10-CM | POA: Diagnosis not present

## 2022-05-26 DIAGNOSIS — I825Z2 Chronic embolism and thrombosis of unspecified deep veins of left distal lower extremity: Secondary | ICD-10-CM | POA: Diagnosis not present

## 2022-05-29 DIAGNOSIS — I110 Hypertensive heart disease with heart failure: Secondary | ICD-10-CM | POA: Diagnosis not present

## 2022-05-29 DIAGNOSIS — D500 Iron deficiency anemia secondary to blood loss (chronic): Secondary | ICD-10-CM | POA: Diagnosis not present

## 2022-05-29 DIAGNOSIS — N1832 Chronic kidney disease, stage 3b: Secondary | ICD-10-CM | POA: Diagnosis not present

## 2022-05-29 DIAGNOSIS — E1122 Type 2 diabetes mellitus with diabetic chronic kidney disease: Secondary | ICD-10-CM | POA: Diagnosis not present

## 2022-05-29 DIAGNOSIS — S79192S Other physeal fracture of lower end of left femur, sequela: Secondary | ICD-10-CM | POA: Diagnosis not present

## 2022-05-29 DIAGNOSIS — M6281 Muscle weakness (generalized): Secondary | ICD-10-CM | POA: Diagnosis not present

## 2022-05-29 DIAGNOSIS — K219 Gastro-esophageal reflux disease without esophagitis: Secondary | ICD-10-CM | POA: Diagnosis not present

## 2022-05-29 DIAGNOSIS — I825Z2 Chronic embolism and thrombosis of unspecified deep veins of left distal lower extremity: Secondary | ICD-10-CM | POA: Diagnosis not present

## 2022-06-01 DIAGNOSIS — E1122 Type 2 diabetes mellitus with diabetic chronic kidney disease: Secondary | ICD-10-CM | POA: Diagnosis not present

## 2022-06-01 DIAGNOSIS — I825Z2 Chronic embolism and thrombosis of unspecified deep veins of left distal lower extremity: Secondary | ICD-10-CM | POA: Diagnosis not present

## 2022-06-01 DIAGNOSIS — N1832 Chronic kidney disease, stage 3b: Secondary | ICD-10-CM | POA: Diagnosis not present

## 2022-06-01 DIAGNOSIS — I110 Hypertensive heart disease with heart failure: Secondary | ICD-10-CM | POA: Diagnosis not present

## 2022-06-01 DIAGNOSIS — K219 Gastro-esophageal reflux disease without esophagitis: Secondary | ICD-10-CM | POA: Diagnosis not present

## 2022-06-01 DIAGNOSIS — D500 Iron deficiency anemia secondary to blood loss (chronic): Secondary | ICD-10-CM | POA: Diagnosis not present

## 2022-06-01 DIAGNOSIS — M6281 Muscle weakness (generalized): Secondary | ICD-10-CM | POA: Diagnosis not present

## 2022-06-01 DIAGNOSIS — S79192S Other physeal fracture of lower end of left femur, sequela: Secondary | ICD-10-CM | POA: Diagnosis not present

## 2022-06-02 DIAGNOSIS — E1122 Type 2 diabetes mellitus with diabetic chronic kidney disease: Secondary | ICD-10-CM | POA: Diagnosis not present

## 2022-06-02 DIAGNOSIS — S79192S Other physeal fracture of lower end of left femur, sequela: Secondary | ICD-10-CM | POA: Diagnosis not present

## 2022-06-02 DIAGNOSIS — K219 Gastro-esophageal reflux disease without esophagitis: Secondary | ICD-10-CM | POA: Diagnosis not present

## 2022-06-02 DIAGNOSIS — N178 Other acute kidney failure: Secondary | ICD-10-CM | POA: Diagnosis not present

## 2022-06-02 DIAGNOSIS — I825Z2 Chronic embolism and thrombosis of unspecified deep veins of left distal lower extremity: Secondary | ICD-10-CM | POA: Diagnosis not present

## 2022-06-02 DIAGNOSIS — D500 Iron deficiency anemia secondary to blood loss (chronic): Secondary | ICD-10-CM | POA: Diagnosis not present

## 2022-06-02 DIAGNOSIS — N1832 Chronic kidney disease, stage 3b: Secondary | ICD-10-CM | POA: Diagnosis not present

## 2022-06-02 DIAGNOSIS — I110 Hypertensive heart disease with heart failure: Secondary | ICD-10-CM | POA: Diagnosis not present

## 2022-06-07 DIAGNOSIS — I82422 Acute embolism and thrombosis of left iliac vein: Secondary | ICD-10-CM | POA: Diagnosis not present

## 2022-06-07 DIAGNOSIS — I130 Hypertensive heart and chronic kidney disease with heart failure and stage 1 through stage 4 chronic kidney disease, or unspecified chronic kidney disease: Secondary | ICD-10-CM | POA: Diagnosis not present

## 2022-06-07 DIAGNOSIS — E1165 Type 2 diabetes mellitus with hyperglycemia: Secondary | ICD-10-CM | POA: Diagnosis not present

## 2022-06-07 DIAGNOSIS — Z7901 Long term (current) use of anticoagulants: Secondary | ICD-10-CM | POA: Diagnosis not present

## 2022-06-07 DIAGNOSIS — I82432 Acute embolism and thrombosis of left popliteal vein: Secondary | ICD-10-CM | POA: Diagnosis not present

## 2022-06-07 DIAGNOSIS — E43 Unspecified severe protein-calorie malnutrition: Secondary | ICD-10-CM | POA: Diagnosis not present

## 2022-06-07 DIAGNOSIS — E1140 Type 2 diabetes mellitus with diabetic neuropathy, unspecified: Secondary | ICD-10-CM | POA: Diagnosis not present

## 2022-06-07 DIAGNOSIS — E559 Vitamin D deficiency, unspecified: Secondary | ICD-10-CM | POA: Diagnosis not present

## 2022-06-07 DIAGNOSIS — I82412 Acute embolism and thrombosis of left femoral vein: Secondary | ICD-10-CM | POA: Diagnosis not present

## 2022-06-07 DIAGNOSIS — S42001D Fracture of unspecified part of right clavicle, subsequent encounter for fracture with routine healing: Secondary | ICD-10-CM | POA: Diagnosis not present

## 2022-06-07 DIAGNOSIS — Z9181 History of falling: Secondary | ICD-10-CM | POA: Diagnosis not present

## 2022-06-07 DIAGNOSIS — D500 Iron deficiency anemia secondary to blood loss (chronic): Secondary | ICD-10-CM | POA: Diagnosis not present

## 2022-06-07 DIAGNOSIS — E538 Deficiency of other specified B group vitamins: Secondary | ICD-10-CM | POA: Diagnosis not present

## 2022-06-07 DIAGNOSIS — I509 Heart failure, unspecified: Secondary | ICD-10-CM | POA: Diagnosis not present

## 2022-06-07 DIAGNOSIS — G510 Bell's palsy: Secondary | ICD-10-CM | POA: Diagnosis not present

## 2022-06-07 DIAGNOSIS — S72142D Displaced intertrochanteric fracture of left femur, subsequent encounter for closed fracture with routine healing: Secondary | ICD-10-CM | POA: Diagnosis not present

## 2022-06-07 DIAGNOSIS — S2249XD Multiple fractures of ribs, unspecified side, subsequent encounter for fracture with routine healing: Secondary | ICD-10-CM | POA: Diagnosis not present

## 2022-06-07 DIAGNOSIS — Z6826 Body mass index (BMI) 26.0-26.9, adult: Secondary | ICD-10-CM | POA: Diagnosis not present

## 2022-06-07 DIAGNOSIS — S80812S Abrasion, left lower leg, sequela: Secondary | ICD-10-CM | POA: Diagnosis not present

## 2022-06-07 DIAGNOSIS — E1122 Type 2 diabetes mellitus with diabetic chronic kidney disease: Secondary | ICD-10-CM | POA: Diagnosis not present

## 2022-06-07 DIAGNOSIS — K219 Gastro-esophageal reflux disease without esophagitis: Secondary | ICD-10-CM | POA: Diagnosis not present

## 2022-06-07 DIAGNOSIS — N1832 Chronic kidney disease, stage 3b: Secondary | ICD-10-CM | POA: Diagnosis not present

## 2022-06-07 DIAGNOSIS — S79192D Other physeal fracture of lower end of left femur, subsequent encounter for fracture with routine healing: Secondary | ICD-10-CM | POA: Diagnosis not present

## 2022-06-08 ENCOUNTER — Other Ambulatory Visit: Payer: Self-pay | Admitting: *Deleted

## 2022-06-08 ENCOUNTER — Telehealth: Payer: Self-pay | Admitting: Internal Medicine

## 2022-06-08 DIAGNOSIS — S2249XD Multiple fractures of ribs, unspecified side, subsequent encounter for fracture with routine healing: Secondary | ICD-10-CM | POA: Diagnosis not present

## 2022-06-08 DIAGNOSIS — I82412 Acute embolism and thrombosis of left femoral vein: Secondary | ICD-10-CM | POA: Diagnosis not present

## 2022-06-08 DIAGNOSIS — I82422 Acute embolism and thrombosis of left iliac vein: Secondary | ICD-10-CM | POA: Diagnosis not present

## 2022-06-08 DIAGNOSIS — S42001D Fracture of unspecified part of right clavicle, subsequent encounter for fracture with routine healing: Secondary | ICD-10-CM | POA: Diagnosis not present

## 2022-06-08 DIAGNOSIS — S72142D Displaced intertrochanteric fracture of left femur, subsequent encounter for closed fracture with routine healing: Secondary | ICD-10-CM | POA: Diagnosis not present

## 2022-06-08 DIAGNOSIS — S79192D Other physeal fracture of lower end of left femur, subsequent encounter for fracture with routine healing: Secondary | ICD-10-CM | POA: Diagnosis not present

## 2022-06-08 NOTE — Telephone Encounter (Signed)
A nurse from [facility] home health care called for verbal orders to [facility]:  2X for 4 weeks 1X for 4 weeks  Please call back to confirm:   [phone]

## 2022-06-08 NOTE — Telephone Encounter (Signed)
Called nuese back no answer LMOM ok for verbal. Pt is compliance w/ appt next appt 08/26/22.Marland KitchenJohny Chess

## 2022-06-08 NOTE — Patient Outreach (Signed)
EMMI notification received.   Mrs. Swalley discharged from [facility] facility on 06/04/22 per [facility].   Telephone call made to [facility] [phone]. However, no answer. Left HIPAA compliant voicemail message to request return call. Received call from Denyse Dago (daughter). Patient identifiers confirmed.   Butch Penny states Mrs. Fleeger is now home. [facility] has begun services. Discussed EMMI notification for PCP follow up after skilled nursing stay. Butch Penny reports PCP appointment isn't scheduled until April. However, Butch Penny states she will call to see if appointment can be scheduled sooner.   Discussed [facility] care coordination services. Butch Penny states [facility] is going to have a Education officer, museum contact her regarding Medication application assistance. Discussed she is receiving quite a few calls as it is. Writer will email [facility] [employer] brochure, website information, and contact information to call if services are needed in the future.   Butch Penny expressed appreciation of writer's outreach.   No identifiable [facility] care coordination needs at this time.    Marthenia Rolling, MSN, RN,BSN [facility] Coordinator [phone] (Direct dial)

## 2022-06-09 ENCOUNTER — Other Ambulatory Visit: Payer: Self-pay | Admitting: Physician Assistant

## 2022-06-09 ENCOUNTER — Other Ambulatory Visit (INDEPENDENT_AMBULATORY_CARE_PROVIDER_SITE_OTHER): Payer: Self-pay | Admitting: Nurse Practitioner

## 2022-06-09 ENCOUNTER — Telehealth (INDEPENDENT_AMBULATORY_CARE_PROVIDER_SITE_OTHER): Payer: Self-pay | Admitting: Nurse Practitioner

## 2022-06-09 DIAGNOSIS — I82402 Acute embolism and thrombosis of unspecified deep veins of left lower extremity: Secondary | ICD-10-CM

## 2022-06-09 DIAGNOSIS — I82422 Acute embolism and thrombosis of left iliac vein: Secondary | ICD-10-CM

## 2022-06-09 NOTE — Telephone Encounter (Signed)
Patient called and LVM stating she wanted her mother to go to the [facility].  She has an upcoming appointment and wants it to be scheduled over there.  Please advise.

## 2022-06-09 NOTE — Telephone Encounter (Signed)
Okay.  Thanks.

## 2022-06-09 NOTE — Telephone Encounter (Signed)
While we are both [facility] entities, we are not connected with that office.  We would be happy to place a referral to that office, however she shouldn't be able to keep the same appointment that she has scheduled here on 02/07 and just have it done in [place] instead.  Her appointment here would be cancelled, and then the [facility] office would call her and set an appointment according to what they have available.  If she's agreeable to that, I will place the referral

## 2022-06-09 NOTE — Telephone Encounter (Signed)
I spoke to patient's daughter and she confirmed that patient was back home and to cancel her appointment on 06/17/22 and refer her over to the [facility].  Her appointments here has been cancelled.

## 2022-06-09 NOTE — Telephone Encounter (Signed)
Referral placed.

## 2022-06-10 ENCOUNTER — Emergency Department (HOSPITAL_COMMUNITY): Payer: Medicare Other

## 2022-06-10 ENCOUNTER — Inpatient Hospital Stay (HOSPITAL_COMMUNITY)
Admission: EM | Admit: 2022-06-10 | Discharge: 2022-06-12 | DRG: 683 | Disposition: A | Discharge status: Home Health | Payer: Medicare Other | Attending: Internal Medicine | Admitting: Internal Medicine

## 2022-06-10 ENCOUNTER — Other Ambulatory Visit: Payer: Self-pay

## 2022-06-10 ENCOUNTER — Inpatient Hospital Stay (HOSPITAL_BASED_OUTPATIENT_CLINIC_OR_DEPARTMENT_OTHER): Payer: Medicare Other | Admitting: Hematology and Oncology

## 2022-06-10 ENCOUNTER — Inpatient Hospital Stay: Payer: Medicare Other

## 2022-06-10 VITALS — BP 125/67 | HR 75 | Temp 97.5°F | Resp 16 | Wt 115.4 lb

## 2022-06-10 DIAGNOSIS — E876 Hypokalemia: Secondary | ICD-10-CM | POA: Diagnosis present

## 2022-06-10 DIAGNOSIS — I871 Compression of vein: Secondary | ICD-10-CM | POA: Diagnosis present

## 2022-06-10 DIAGNOSIS — I82422 Acute embolism and thrombosis of left iliac vein: Secondary | ICD-10-CM | POA: Insufficient documentation

## 2022-06-10 DIAGNOSIS — Z90710 Acquired absence of both cervix and uterus: Secondary | ICD-10-CM | POA: Diagnosis not present

## 2022-06-10 DIAGNOSIS — K219 Gastro-esophageal reflux disease without esophagitis: Secondary | ICD-10-CM | POA: Diagnosis not present

## 2022-06-10 DIAGNOSIS — Z885 Allergy status to narcotic agent status: Secondary | ICD-10-CM

## 2022-06-10 DIAGNOSIS — Z841 Family history of disorders of kidney and ureter: Secondary | ICD-10-CM

## 2022-06-10 DIAGNOSIS — I129 Hypertensive chronic kidney disease with stage 1 through stage 4 chronic kidney disease, or unspecified chronic kidney disease: Secondary | ICD-10-CM | POA: Diagnosis present

## 2022-06-10 DIAGNOSIS — N1832 Chronic kidney disease, stage 3b: Secondary | ICD-10-CM | POA: Diagnosis present

## 2022-06-10 DIAGNOSIS — X58XXXD Exposure to other specified factors, subsequent encounter: Secondary | ICD-10-CM | POA: Diagnosis present

## 2022-06-10 DIAGNOSIS — E119 Type 2 diabetes mellitus without complications: Secondary | ICD-10-CM | POA: Insufficient documentation

## 2022-06-10 DIAGNOSIS — Z95828 Presence of other vascular implants and grafts: Secondary | ICD-10-CM

## 2022-06-10 DIAGNOSIS — R944 Abnormal results of kidney function studies: Secondary | ICD-10-CM | POA: Insufficient documentation

## 2022-06-10 DIAGNOSIS — Z7984 Long term (current) use of oral hypoglycemic drugs: Secondary | ICD-10-CM

## 2022-06-10 DIAGNOSIS — Z8049 Family history of malignant neoplasm of other genital organs: Secondary | ICD-10-CM

## 2022-06-10 DIAGNOSIS — D509 Iron deficiency anemia, unspecified: Secondary | ICD-10-CM | POA: Diagnosis present

## 2022-06-10 DIAGNOSIS — I82412 Acute embolism and thrombosis of left femoral vein: Secondary | ICD-10-CM | POA: Insufficient documentation

## 2022-06-10 DIAGNOSIS — D649 Anemia, unspecified: Secondary | ICD-10-CM | POA: Diagnosis not present

## 2022-06-10 DIAGNOSIS — Z888 Allergy status to other drugs, medicaments and biological substances status: Secondary | ICD-10-CM

## 2022-06-10 DIAGNOSIS — Z79899 Other long term (current) drug therapy: Secondary | ICD-10-CM | POA: Diagnosis not present

## 2022-06-10 DIAGNOSIS — N179 Acute kidney failure, unspecified: Secondary | ICD-10-CM | POA: Diagnosis not present

## 2022-06-10 DIAGNOSIS — I10 Essential (primary) hypertension: Secondary | ICD-10-CM | POA: Diagnosis present

## 2022-06-10 DIAGNOSIS — S42001D Fracture of unspecified part of right clavicle, subsequent encounter for fracture with routine healing: Secondary | ICD-10-CM | POA: Diagnosis not present

## 2022-06-10 DIAGNOSIS — E1165 Type 2 diabetes mellitus with hyperglycemia: Secondary | ICD-10-CM | POA: Diagnosis not present

## 2022-06-10 DIAGNOSIS — Z86718 Personal history of other venous thrombosis and embolism: Secondary | ICD-10-CM

## 2022-06-10 DIAGNOSIS — T502X5A Adverse effect of carbonic-anhydrase inhibitors, benzothiadiazides and other diuretics, initial encounter: Secondary | ICD-10-CM | POA: Diagnosis present

## 2022-06-10 DIAGNOSIS — N189 Chronic kidney disease, unspecified: Secondary | ICD-10-CM | POA: Insufficient documentation

## 2022-06-10 DIAGNOSIS — D631 Anemia in chronic kidney disease: Secondary | ICD-10-CM | POA: Diagnosis not present

## 2022-06-10 DIAGNOSIS — Z8249 Family history of ischemic heart disease and other diseases of the circulatory system: Secondary | ICD-10-CM | POA: Diagnosis not present

## 2022-06-10 DIAGNOSIS — Z7901 Long term (current) use of anticoagulants: Secondary | ICD-10-CM

## 2022-06-10 DIAGNOSIS — I82890 Acute embolism and thrombosis of other specified veins: Secondary | ICD-10-CM | POA: Insufficient documentation

## 2022-06-10 DIAGNOSIS — S72142D Displaced intertrochanteric fracture of left femur, subsequent encounter for closed fracture with routine healing: Secondary | ICD-10-CM | POA: Diagnosis not present

## 2022-06-10 DIAGNOSIS — I82402 Acute embolism and thrombosis of unspecified deep veins of left lower extremity: Secondary | ICD-10-CM

## 2022-06-10 DIAGNOSIS — S79192D Other physeal fracture of lower end of left femur, subsequent encounter for fracture with routine healing: Secondary | ICD-10-CM | POA: Diagnosis not present

## 2022-06-10 DIAGNOSIS — Z8639 Personal history of other endocrine, nutritional and metabolic disease: Secondary | ICD-10-CM | POA: Insufficient documentation

## 2022-06-10 DIAGNOSIS — D638 Anemia in other chronic diseases classified elsewhere: Secondary | ICD-10-CM | POA: Diagnosis present

## 2022-06-10 DIAGNOSIS — E1122 Type 2 diabetes mellitus with diabetic chronic kidney disease: Secondary | ICD-10-CM | POA: Diagnosis present

## 2022-06-10 DIAGNOSIS — S72002D Fracture of unspecified part of neck of left femur, subsequent encounter for closed fracture with routine healing: Secondary | ICD-10-CM

## 2022-06-10 DIAGNOSIS — I82409 Acute embolism and thrombosis of unspecified deep veins of unspecified lower extremity: Secondary | ICD-10-CM | POA: Diagnosis present

## 2022-06-10 DIAGNOSIS — S2249XD Multiple fractures of ribs, unspecified side, subsequent encounter for fracture with routine healing: Secondary | ICD-10-CM | POA: Diagnosis not present

## 2022-06-10 DIAGNOSIS — Z833 Family history of diabetes mellitus: Secondary | ICD-10-CM | POA: Diagnosis not present

## 2022-06-10 HISTORY — DX: Acute kidney failure, unspecified: N17.9

## 2022-06-10 LAB — CMP (CANCER CENTER ONLY)
ALT: 15 U/L (ref 0–44)
AST: 16 U/L (ref 15–41)
Albumin: 4 g/dL (ref 3.5–5.0)
Alkaline Phosphatase: 135 U/L — ABNORMAL HIGH (ref 38–126)
Anion gap: 10 (ref 5–15)
BUN: 65 mg/dL — ABNORMAL HIGH (ref 8–23)
CO2: 30 mmol/L (ref 22–32)
Calcium: 10.4 mg/dL — ABNORMAL HIGH (ref 8.9–10.3)
Chloride: 99 mmol/L (ref 98–111)
Creatinine: 2.63 mg/dL — ABNORMAL HIGH (ref 0.44–1.00)
GFR, Estimated: 18 mL/min — ABNORMAL LOW (ref >60)
Glucose, Bld: 171 mg/dL — ABNORMAL HIGH (ref 70–99)
Potassium: 4.1 mmol/L (ref 3.5–5.1)
Sodium: 139 mmol/L (ref 135–145)
Total Bilirubin: 1.6 mg/dL — ABNORMAL HIGH (ref 0.3–1.2)
Total Protein: 7.3 g/dL (ref 6.5–8.1)

## 2022-06-10 LAB — CBC WITH DIFFERENTIAL (CANCER CENTER ONLY)
Abs Immature Granulocytes: 0.04 10*3/uL (ref 0.00–0.07)
Basophils Absolute: 0 10*3/uL (ref 0.0–0.1)
Basophils Relative: 0 %
Eosinophils Absolute: 0.1 10*3/uL (ref 0.0–0.5)
Eosinophils Relative: 2 %
HCT: 36.4 % (ref 36.0–46.0)
Hemoglobin: 13 g/dL (ref 12.0–15.0)
Immature Granulocytes: 1 %
Lymphocytes Relative: 27 %
Lymphs Abs: 2.2 10*3/uL (ref 0.7–4.0)
MCH: 31.2 pg (ref 26.0–34.0)
MCHC: 35.7 g/dL (ref 30.0–36.0)
MCV: 87.3 fL (ref 80.0–100.0)
Monocytes Absolute: 0.8 10*3/uL (ref 0.1–1.0)
Monocytes Relative: 10 %
Neutro Abs: 4.8 10*3/uL (ref 1.7–7.7)
Neutrophils Relative %: 60 %
Platelet Count: 203 10*3/uL (ref 150–400)
RBC: 4.17 MIL/uL (ref 3.87–5.11)
RDW: 13.7 % (ref 11.5–15.5)
WBC Count: 8 10*3/uL (ref 4.0–10.5)
nRBC: 0 % (ref 0.0–0.2)

## 2022-06-10 LAB — HEPARIN LEVEL (UNFRACTIONATED): Heparin Unfractionated: >1.1 IU/mL — ABNORMAL HIGH (ref 0.30–0.70)

## 2022-06-10 LAB — GLUCOSE, CAPILLARY: Glucose-Capillary: 226 mg/dL — ABNORMAL HIGH (ref 70–99)

## 2022-06-10 LAB — APTT: aPTT: 51 seconds — ABNORMAL HIGH (ref 24–36)

## 2022-06-10 MED ORDER — INSULIN ASPART 100 UNIT/ML IJ SOLN
0.0000 [IU] | Freq: Every day | INTRAMUSCULAR | Status: DC
  Administered 2022-06-10: 2 [IU] via SUBCUTANEOUS
  Filled 2022-06-10: qty 0.05

## 2022-06-10 MED ORDER — INSULIN ASPART 100 UNIT/ML IJ SOLN
0.0000 [IU] | Freq: Three times a day (TID) | INTRAMUSCULAR | Status: DC
  Administered 2022-06-11: 5 [IU] via SUBCUTANEOUS
  Administered 2022-06-11 – 2022-06-12 (×2): 1 [IU] via SUBCUTANEOUS
  Administered 2022-06-12: 3 [IU] via SUBCUTANEOUS
  Filled 2022-06-10: qty 0.09

## 2022-06-10 MED ORDER — RIVAROXABAN 20 MG PO TABS: 20.0000 mg | ORAL_TABLET | Freq: Every day | ORAL | 1 refills | Status: DC

## 2022-06-10 MED ORDER — SODIUM CHLORIDE 0.9 % IV SOLN: INTRAVENOUS | Status: DC

## 2022-06-10 MED ORDER — HEPARIN (PORCINE) 25000 UT/250ML-% IV SOLN
500.0000 [IU]/h | INTRAVENOUS | Status: DC
  Administered 2022-06-10: 400 [IU]/h via INTRAVENOUS
  Filled 2022-06-10: qty 250

## 2022-06-10 MED ORDER — PANTOPRAZOLE SODIUM 40 MG PO TBEC
40.0000 mg | DELAYED_RELEASE_TABLET | Freq: Every day | ORAL | Status: DC
  Administered 2022-06-10: 40 mg via ORAL
  Filled 2022-06-10: qty 1

## 2022-06-10 MED ORDER — ACETAMINOPHEN 325 MG PO TABS
650.0000 mg | ORAL_TABLET | Freq: Four times a day (QID) | ORAL | Status: DC | PRN
  Administered 2022-06-10: 650 mg via ORAL
  Filled 2022-06-10: qty 2

## 2022-06-10 MED ORDER — LACTATED RINGERS IV BOLUS
500.0000 mL | Freq: Once | INTRAVENOUS | Status: AC
  Administered 2022-06-10: 500 mL via INTRAVENOUS
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
## 2022-06-11 ENCOUNTER — Telehealth: Payer: Self-pay | Admitting: *Deleted

## 2022-06-11 DIAGNOSIS — E1165 Type 2 diabetes mellitus with hyperglycemia: Secondary | ICD-10-CM

## 2022-06-11 DIAGNOSIS — N1832 Chronic kidney disease, stage 3b: Secondary | ICD-10-CM | POA: Diagnosis present

## 2022-06-11 DIAGNOSIS — I129 Hypertensive chronic kidney disease with stage 1 through stage 4 chronic kidney disease, or unspecified chronic kidney disease: Secondary | ICD-10-CM | POA: Diagnosis present

## 2022-06-11 DIAGNOSIS — I871 Compression of vein: Secondary | ICD-10-CM | POA: Diagnosis present

## 2022-06-11 DIAGNOSIS — D631 Anemia in chronic kidney disease: Secondary | ICD-10-CM | POA: Diagnosis not present

## 2022-06-11 DIAGNOSIS — Z885 Allergy status to narcotic agent status: Secondary | ICD-10-CM | POA: Diagnosis not present

## 2022-06-11 DIAGNOSIS — I82422 Acute embolism and thrombosis of left iliac vein: Secondary | ICD-10-CM | POA: Diagnosis present

## 2022-06-11 DIAGNOSIS — Z7901 Long term (current) use of anticoagulants: Secondary | ICD-10-CM | POA: Diagnosis not present

## 2022-06-11 DIAGNOSIS — N179 Acute kidney failure, unspecified: Secondary | ICD-10-CM | POA: Diagnosis present

## 2022-06-11 DIAGNOSIS — D649 Anemia, unspecified: Secondary | ICD-10-CM

## 2022-06-11 DIAGNOSIS — Z86718 Personal history of other venous thrombosis and embolism: Secondary | ICD-10-CM | POA: Diagnosis not present

## 2022-06-11 DIAGNOSIS — I10 Essential (primary) hypertension: Secondary | ICD-10-CM | POA: Diagnosis not present

## 2022-06-11 DIAGNOSIS — Z841 Family history of disorders of kidney and ureter: Secondary | ICD-10-CM | POA: Diagnosis not present

## 2022-06-11 DIAGNOSIS — K219 Gastro-esophageal reflux disease without esophagitis: Secondary | ICD-10-CM | POA: Diagnosis present

## 2022-06-11 DIAGNOSIS — Z79899 Other long term (current) drug therapy: Secondary | ICD-10-CM | POA: Diagnosis not present

## 2022-06-11 DIAGNOSIS — E1122 Type 2 diabetes mellitus with diabetic chronic kidney disease: Secondary | ICD-10-CM | POA: Diagnosis present

## 2022-06-11 DIAGNOSIS — Z95828 Presence of other vascular implants and grafts: Secondary | ICD-10-CM | POA: Diagnosis not present

## 2022-06-11 DIAGNOSIS — I82402 Acute embolism and thrombosis of unspecified deep veins of left lower extremity: Secondary | ICD-10-CM | POA: Diagnosis not present

## 2022-06-11 DIAGNOSIS — Z7984 Long term (current) use of oral hypoglycemic drugs: Secondary | ICD-10-CM | POA: Diagnosis not present

## 2022-06-11 DIAGNOSIS — Z833 Family history of diabetes mellitus: Secondary | ICD-10-CM | POA: Diagnosis not present

## 2022-06-11 DIAGNOSIS — X58XXXD Exposure to other specified factors, subsequent encounter: Secondary | ICD-10-CM | POA: Diagnosis present

## 2022-06-11 DIAGNOSIS — T502X5A Adverse effect of carbonic-anhydrase inhibitors, benzothiadiazides and other diuretics, initial encounter: Secondary | ICD-10-CM | POA: Diagnosis present

## 2022-06-11 DIAGNOSIS — D509 Iron deficiency anemia, unspecified: Secondary | ICD-10-CM | POA: Diagnosis present

## 2022-06-11 DIAGNOSIS — Z8249 Family history of ischemic heart disease and other diseases of the circulatory system: Secondary | ICD-10-CM | POA: Diagnosis not present

## 2022-06-11 DIAGNOSIS — Z888 Allergy status to other drugs, medicaments and biological substances status: Secondary | ICD-10-CM | POA: Diagnosis not present

## 2022-06-11 DIAGNOSIS — D638 Anemia in other chronic diseases classified elsewhere: Secondary | ICD-10-CM | POA: Diagnosis present

## 2022-06-11 DIAGNOSIS — E876 Hypokalemia: Secondary | ICD-10-CM | POA: Diagnosis present

## 2022-06-11 DIAGNOSIS — Z90710 Acquired absence of both cervix and uterus: Secondary | ICD-10-CM | POA: Diagnosis not present

## 2022-06-11 DIAGNOSIS — S72002D Fracture of unspecified part of neck of left femur, subsequent encounter for closed fracture with routine healing: Secondary | ICD-10-CM | POA: Diagnosis not present

## 2022-06-11 LAB — URINALYSIS, ROUTINE W REFLEX MICROSCOPIC
Bilirubin Urine: NEGATIVE
Glucose, UA: 150 mg/dL — AB
Hgb urine dipstick: NEGATIVE
Ketones, ur: NEGATIVE mg/dL
Nitrite: NEGATIVE
Protein, ur: NEGATIVE mg/dL
Specific Gravity, Urine: 1.01 (ref 1.005–1.030)
pH: 5 (ref 5.0–8.0)

## 2022-06-11 LAB — COMPREHENSIVE METABOLIC PANEL WITH GFR
ALT: 15 U/L (ref 0–44)
AST: 19 U/L (ref 15–41)
Albumin: 3.3 g/dL — ABNORMAL LOW (ref 3.5–5.0)
Alkaline Phosphatase: 104 U/L (ref 38–126)
Anion gap: 13 (ref 5–15)
BUN: 64 mg/dL — ABNORMAL HIGH (ref 8–23)
CO2: 22 mmol/L (ref 22–32)
Calcium: 9.2 mg/dL (ref 8.9–10.3)
Chloride: 104 mmol/L (ref 98–111)
Creatinine, Ser: 2.1 mg/dL — ABNORMAL HIGH (ref 0.44–1.00)
GFR, Estimated: 23 mL/min — ABNORMAL LOW
Glucose, Bld: 79 mg/dL (ref 70–99)
Potassium: 3.4 mmol/L — ABNORMAL LOW (ref 3.5–5.1)
Sodium: 139 mmol/L (ref 135–145)
Total Bilirubin: 1.1 mg/dL (ref 0.3–1.2)
Total Protein: 6 g/dL — ABNORMAL LOW (ref 6.5–8.1)

## 2022-06-11 LAB — GLUCOSE, CAPILLARY
Glucose-Capillary: 63 mg/dL — ABNORMAL LOW (ref 70–99)
Glucose-Capillary: 124 mg/dL — ABNORMAL HIGH (ref 70–99)
Glucose-Capillary: 270 mg/dL — ABNORMAL HIGH (ref 70–99)
Glucose-Capillary: 105 mg/dL — ABNORMAL HIGH (ref 70–99)
Glucose-Capillary: 105 mg/dL — ABNORMAL HIGH (ref 70–99)
Glucose-Capillary: 148 mg/dL — ABNORMAL HIGH (ref 70–99)

## 2022-06-11 LAB — HEPARIN LEVEL (UNFRACTIONATED): Heparin Unfractionated: >1.1 IU/mL — ABNORMAL HIGH (ref 0.30–0.70)

## 2022-06-11 LAB — MAGNESIUM: Magnesium: 2 mg/dL (ref 1.7–2.4)

## 2022-06-11 LAB — CBC
HCT: 34.4 % — ABNORMAL LOW (ref 36.0–46.0)
Hemoglobin: 11.3 g/dL — ABNORMAL LOW (ref 12.0–15.0)
MCH: 30.2 pg (ref 26.0–34.0)
MCHC: 32.8 g/dL (ref 30.0–36.0)
MCV: 92 fL (ref 80.0–100.0)
Platelets: 171 10*3/uL (ref 150–400)
RBC: 3.74 MIL/uL — ABNORMAL LOW (ref 3.87–5.11)
RDW: 14.1 % (ref 11.5–15.5)
WBC: 6.4 10*3/uL (ref 4.0–10.5)
nRBC: 0 % (ref 0.0–0.2)

## 2022-06-11 LAB — APTT
aPTT: 59 seconds — ABNORMAL HIGH (ref 24–36)
aPTT: 90 seconds — ABNORMAL HIGH (ref 24–36)
aPTT: 67 seconds — ABNORMAL HIGH (ref 24–36)

## 2022-06-11 MED ORDER — THIAMINE MONONITRATE 100 MG PO TABS
100.0000 mg | ORAL_TABLET | Freq: Every day | ORAL | Status: DC
  Administered 2022-06-11: 100 mg via ORAL
  Filled 2022-06-11: qty 1

## 2022-06-11 MED ORDER — VITAMIN B-12 1000 MCG PO TABS
1000.0000 ug | ORAL_TABLET | Freq: Every day | ORAL | Status: DC
  Administered 2022-06-11 – 2022-06-12 (×2): 1000 ug via ORAL
  Filled 2022-06-11 (×2): qty 1

## 2022-06-11 MED ORDER — VITAMIN D 25 MCG (1000 UNIT) PO TABS
2000.0000 [IU] | ORAL_TABLET | Freq: Every day | ORAL | Status: DC
  Administered 2022-06-11 – 2022-06-12 (×2): 2000 [IU] via ORAL
  Filled 2022-06-11 (×2): qty 2

## 2022-06-11 MED ORDER — POTASSIUM CHLORIDE CRYS ER 20 MEQ PO TBCR
20.0000 meq | EXTENDED_RELEASE_TABLET | Freq: Once | ORAL | Status: AC
  Administered 2022-06-11: 20 meq via ORAL
  Filled 2022-06-11: qty 1

## 2022-06-11 MED ORDER — PANTOPRAZOLE SODIUM 40 MG PO TBEC
40.0000 mg | DELAYED_RELEASE_TABLET | Freq: Every day | ORAL | Status: DC
  Administered 2022-06-11 – 2022-06-12 (×2): 40 mg via ORAL
  Filled 2022-06-11 (×2): qty 1

## 2022-06-11 MED ORDER — CARVEDILOL 25 MG PO TABS
25.0000 mg | ORAL_TABLET | Freq: Two times a day (BID) | ORAL | Status: DC
  Administered 2022-06-11 – 2022-06-12 (×2): 25 mg via ORAL
  Filled 2022-06-11 (×2): qty 1

## 2022-06-11 MED ORDER — FOLIC ACID 1 MG PO TABS
1.0000 mg | ORAL_TABLET | Freq: Every day | ORAL | Status: DC
  Administered 2022-06-11 – 2022-06-12 (×2): 1 mg via ORAL
  Filled 2022-06-11 (×2): qty 1

## 2022-06-11 NOTE — Telephone Encounter (Signed)
10:40 am Received email with FMLA attachments for Brandy Gonzales's daughter [name].  Urgent reply sent with instructions to complete and sign "Employee Sections of [id number]", sign Employer ([place] Authorization.   Prepared and attached the [facility] forms cover sheet and a [employer] Form for D . Trevor Mace, Health Care POA to to complete and sign.

## 2022-06-11 NOTE — Progress Notes (Signed)
[facility] for heparin Indication: Bridge therapy while Xarelto for DVT on hold for AKI  Allergies  Allergen Reactions   Amlodipine Besylate Other (See Comments)    Hair loss   Atenolol Other (See Comments)    Fluid retention   Oxycodone Hcl Other (See Comments)    Dizziness    Propoxyphene N-Acetaminophen Other (See Comments)    Unknown reaction   Enalapril Maleate Palpitations    Patient Measurements:   Heparin Dosing Weight: 52.3 kg (TBW)  Vital Signs: Temp: 98 F (36.7 C) (02/01 1402) Temp Source: Oral (02/01 1402) BP: 116/53 (02/01 1402) Pulse Rate: 68 (02/01 1402)  Labs: Recent Labs    06/10/22 1332 06/10/22 1930 06/11/22 0540 06/11/22 1413  HGB 13.0  --  11.3*  --   HCT 36.4  --  34.4*  --   PLT 203  --  171  --   APTT  --  51* 59* 90*  HEPARINUNFRC  --  >1.10* >1.10*  --   CREATININE 2.63*  --  2.10*  --      Estimated Creatinine Clearance: 15.3 mL/min (A) (by C-G formula based on SCr of 2.1 mg/dL (H)).   Assessment: 37 yoF on Xarelto for recent DVT in December, admitted from hematologist's office for AKI. Xarelto held and Pharmacy consulted to dose IV heparin.  Baseline aPTT mildly elevated; heparin level grossly elevated as expected after DOAC Prior anticoagulation: Xarelto 20 mg daily; last dose 1/30  Significant events:  Today, 06/11/2022: CBC: Hgb slightly down from admission (likely dilutional from fluid resuscitation); Plt stable WNL Most recent aPTT therapeutic on 500 units/hr; heparin level remains supratherapeutic as expected above No bleeding or infusion issues per nursing  Goal of Therapy: Heparin level 0.3-0.7 units/ml Monitor platelets by anticoagulation protocol: Yes  Plan: Continue heparin IV infusion at 500 units/hr Recheck confirmatory aPTT in 8 hrs Daily CBC, daily heparin level once stable; additional aPTTs as needed while heparin level unreliable Monitor for signs of bleeding or  thrombosis  Reuel Boom, PharmD, BCPS [phone] 06/11/2022, 3:03 PM
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
## 2022-06-11 NOTE — Progress Notes (Signed)
PROGRESS NOTE    Brandy Gonzales  AUQ:[id number] DOB: 15-Aug-1940 DOA: 06/10/2022 PCP: Cassandria Anger, MD   Chief Complaint  Patient presents with   Abnomal Lab    Brief Narrative:  Patient is a 82 year old female history of recently diagnosed extensive left lower extremity DVT, hypertension, DM2, CKD stage IIIb presented from hematologist office with lab abnormalities noted to have elevated creatinine from baseline.  Patient admitted, Xarelto changed to heparin due to AKI, patient diuretics held patient placed on IV fluids.   Assessment & Plan:   Principal Problem:   AKI (acute kidney injury) ([place]) Active Problems:   Uncontrolled type 2 diabetes mellitus with hyperglycemia, without long-term current use of insulin (HCC)   Essential hypertension   GERD   Stage 3b chronic kidney disease (CKD) (HCC)   DVT (deep venous thrombosis) (HCC)   Anemia  #1 acute kidney injury on CKD stage IIIb -Likely secondary to prerenal azotemia in the setting of torsemide. -Patient noted to have recently been discharged on 03/22/2023 from the hospital for acute extensive DVT involving left iliac vein, common femoral vein and noted to have failed outpatient treatment with Eliquis and discharged on Xarelto.  Patient also discharged on torsemide as needed for lower extremity edema however per daughter after patient was sent home from skilled nursing facility instructions were to take torsemide twice daily which patient was getting. -Baseline creatinine approximately 1.5-1.7. -Creatinine on admission noted at 2.63. -Urinalysis done on admission with small leukocytes, nitrite negative, many bacteria, 6-10 WBCs. -Renal ultrasound to rule out hydronephrosis. -Renal function improving with hydration. -Creatinine currently at 2.10. -Patient with urine output of 1.1 L over the past 24 hours. -Torsemide discontinued. -Continue hydration with IV fluids. -Repeat labs in the AM.  2.  Acute extensive DVT  involving left iliac, common femoral vein -During recent hospitalization noted to have failed outpatient Eliquis. -Patient noted to have been put this on Xarelto per discharge summary after discussion with hematology/oncology. -Status post IVC filter placement. -Status post mechanical thrombectomy per vascular surgery during last hospitalization. -Currently on heparin pending improvement with renal function and transitioning back to Xarelto.  3.  Diabetes mellitus type 2 with hyperglycemia -Hemoglobin A1c noted at 7.8 on 04/22/2022. -CBG at 63 this morning, patient asymptomatic with repeat at 124. -Continue to hold home regimen oral hypoglycemic agents. -SSI.  4.  Hypertension -Resume home regimen Coreg.  5.  Recent left hip fracture -PT/OT. -Outpatient follow-up with orthopedic.  6.  Anemia -Patient with no overt bleeding. -Hemoglobin stable at 11.3. -Check an anemia panel. -Follow H&H.  7.  Hypokalemia -Magnesium at 2.0. -K-Dur 20 milliequivalents p.o. x 1.   DVT prophylaxis: Heparin Code Status: Full Family Communication: Updated patient, daughter at bedside. Disposition: Likely home with home health following.  Status is: Inpatient The patient will require care spanning > 2 midnights and should be moved to inpatient because: Severity of illness   Consultants:  None  Procedures:  None  Antimicrobials:  Anti-infectives (From admission, onward)    None         Subjective: \Sleeping but easily arousable.  Denies any chest pain.  No shortness of breath.  No abdominal pain.  Daughter at bedside.  Daughter states diuretic was started during last hospitalization/rehab for lower extremity swelling after DVT noted.  Objective: Vitals:   06/10/22 2139 06/11/22 0154 06/11/22 0606 06/11/22 1402  BP: 128/62 (!) 153/86 (!) 149/63 (!) 116/53  Pulse: 63 61 60 68  Resp: 17 18 17  16  Temp: 97.6 F (36.4 C) (!) 97.3 F (36.3 C) (!) 97.4 F (36.3 C) 98 F (36.7 C)   TempSrc: Oral Oral Oral Oral  SpO2: 100% 100% 100% 100%    Intake/Output Summary (Last 24 hours) at 06/11/2022 1550 Last data filed at 06/11/2022 1026 Gross per 24 hour  Intake 1236.82 ml  Output 1100 ml  Net 136.82 ml   There were no vitals filed for this visit.  Examination:  General exam: NAD Respiratory system: Clear to auscultation anterior lung fields, no wheezes, no crackles, no rhonchi.  Fair air movement.  Speaking in full sentences.Marland Kitchen Respiratory effort normal. Cardiovascular system: S1 & S2 heard, RRR. No JVD, murmurs, rubs, gallops or clicks.  Left lower extremity with 1-2+ left lower extremity edema. Gastrointestinal system: Abdomen is soft, nontender, nondistended, positive bowel sounds.  No rebound.  No guarding.  Central nervous system: Alert and oriented. No focal neurological deficits. Extremities: 1-2+ left lower extremity edema.  Skin: No rashes, lesions or ulcers Psychiatry: Judgement and insight appear normal. Mood & affect appropriate.     Data Reviewed: I have personally reviewed following labs and imaging studies  CBC: Recent Labs  Lab 06/10/22 1332 06/11/22 0540  WBC 8.0 6.4  NEUTROABS 4.8  --   HGB 13.0 11.3*  HCT 36.4 34.4*  MCV 87.3 92.0  PLT 203 283    Basic Metabolic Panel: Recent Labs  Lab 06/10/22 1332 06/11/22 0540 06/11/22 0548  NA 139 139  --   K 4.1 3.4*  --   CL 99 104  --   CO2 30 22  --   GLUCOSE 171* 79  --   BUN 65* 64*  --   CREATININE 2.63* 2.10*  --   CALCIUM 10.4* 9.2  --   MG  --   --  2.0    GFR: Estimated Creatinine Clearance: 15.3 mL/min (A) (by C-G formula based on SCr of 2.1 mg/dL (H)).  Liver Function Tests: Recent Labs  Lab 06/10/22 1332 06/11/22 0540  AST 16 19  ALT 15 15  ALKPHOS 135* 104  BILITOT 1.6* 1.1  PROT 7.3 6.0*  ALBUMIN 4.0 3.3*    CBG: Recent Labs  Lab 06/10/22 2137 06/11/22 0744 06/11/22 0936 06/11/22 1132 06/11/22 1517  GLUCAP 226* 63* 124* 270* 105*     No results  found for this or any previous visit (from the past 240 hour(s)).       Radiology Studies: No results found.      Scheduled Meds:  carvedilol  25 mg Oral BID WC   cholecalciferol  2,000 Units Oral Daily   cyanocobalamin  1,000 mcg Oral Daily   folic acid  1 mg Oral Daily   insulin aspart  0-5 Units Subcutaneous QHS   insulin aspart  0-9 Units Subcutaneous TID WC   pantoprazole  40 mg Oral Daily   thiamine  100 mg Oral Daily   Continuous Infusions:  sodium chloride 100 mL/hr at 06/11/22 1026   heparin 500 Units/hr (06/11/22 1026)     LOS: 0 days    Time spent: 35 minutes    Irine Seal, MD [facility]   To contact the attending provider between 7A-7P or the covering provider during after hours 7P-7A, please log into the web site [email or web address] and access using universal [place] password for that web site. If you do not have the password, please call the hospital operator.  06/11/2022, 3:50 PM

## 2022-06-12 DIAGNOSIS — D631 Anemia in chronic kidney disease: Secondary | ICD-10-CM

## 2022-06-12 DIAGNOSIS — E1165 Type 2 diabetes mellitus with hyperglycemia: Secondary | ICD-10-CM | POA: Diagnosis not present

## 2022-06-12 DIAGNOSIS — I10 Essential (primary) hypertension: Secondary | ICD-10-CM | POA: Diagnosis not present

## 2022-06-12 DIAGNOSIS — K219 Gastro-esophageal reflux disease without esophagitis: Secondary | ICD-10-CM

## 2022-06-12 DIAGNOSIS — I82402 Acute embolism and thrombosis of unspecified deep veins of left lower extremity: Secondary | ICD-10-CM | POA: Diagnosis not present

## 2022-06-12 DIAGNOSIS — N179 Acute kidney failure, unspecified: Secondary | ICD-10-CM | POA: Diagnosis not present

## 2022-06-12 LAB — CBC
HCT: 30.3 % — ABNORMAL LOW (ref 36.0–46.0)
Hemoglobin: 9.8 g/dL — ABNORMAL LOW (ref 12.0–15.0)
MCH: 30.1 pg (ref 26.0–34.0)
MCHC: 32.3 g/dL (ref 30.0–36.0)
MCV: 92.9 fL (ref 80.0–100.0)
Platelets: 150 10*3/uL (ref 150–400)
RBC: 3.26 MIL/uL — ABNORMAL LOW (ref 3.87–5.11)
RDW: 14 % (ref 11.5–15.5)
WBC: 5.1 10*3/uL (ref 4.0–10.5)
nRBC: 0 % (ref 0.0–0.2)

## 2022-06-12 LAB — VITAMIN B12: Vitamin B-12: 2152 pg/mL — ABNORMAL HIGH (ref 180–914)

## 2022-06-12 LAB — IRON AND TIBC
Iron: 67 ug/dL (ref 28–170)
Saturation Ratios: 33 % — ABNORMAL HIGH (ref 10.4–31.8)
TIBC: 204 ug/dL — ABNORMAL LOW (ref 250–450)
UIBC: 137 ug/dL

## 2022-06-12 LAB — GLUCOSE, CAPILLARY
Glucose-Capillary: 131 mg/dL — ABNORMAL HIGH (ref 70–99)
Glucose-Capillary: 211 mg/dL — ABNORMAL HIGH (ref 70–99)

## 2022-06-12 LAB — BASIC METABOLIC PANEL
Anion gap: 6 (ref 5–15)
BUN: 44 mg/dL — ABNORMAL HIGH (ref 8–23)
CO2: 22 mmol/L (ref 22–32)
Calcium: 8.8 mg/dL — ABNORMAL LOW (ref 8.9–10.3)
Chloride: 113 mmol/L — ABNORMAL HIGH (ref 98–111)
Creatinine, Ser: 1.64 mg/dL — ABNORMAL HIGH (ref 0.44–1.00)
GFR, Estimated: 31 mL/min — ABNORMAL LOW (ref >60)
Glucose, Bld: 108 mg/dL — ABNORMAL HIGH (ref 70–99)
Potassium: 3.6 mmol/L (ref 3.5–5.1)
Sodium: 141 mmol/L (ref 135–145)

## 2022-06-12 LAB — FERRITIN: Ferritin: 331 ng/mL — ABNORMAL HIGH (ref 11–307)

## 2022-06-12 LAB — HEPARIN LEVEL (UNFRACTIONATED): Heparin Unfractionated: 0.65 IU/mL (ref 0.30–0.70)

## 2022-06-12 LAB — APTT: aPTT: 91 seconds — ABNORMAL HIGH (ref 24–36)

## 2022-06-12 LAB — FOLATE: Folate: 34.4 ng/mL (ref >5.9)

## 2022-06-12 MED ORDER — RIVAROXABAN 20 MG PO TABS
20.0000 mg | ORAL_TABLET | Freq: Every day | ORAL | Status: DC
  Administered 2022-06-12: 20 mg via ORAL
  Filled 2022-06-12: qty 1

## 2022-06-12 MED ORDER — RIVAROXABAN 20 MG PO TABS: 20.0000 mg | ORAL_TABLET | Freq: Every day | ORAL | 1 refills | Status: DC

## 2022-06-12 NOTE — Evaluation (Signed)
Physical Therapy Evaluation Patient Details Name: Brandy Gonzales MRN: [id number] DOB: 1941-03-29 Today's Date: 06/12/2022  History of Present Illness  82 year old female history of recently diagnosed extensive left lower extremity DVT, hypertension, DM2, CKD stage IIIb, Recent L femur fx 04/26/22 presented from hematologist office with lab abnormalities noted to have elevated creatinine from baseline.  Patient admitted on 06/10/22, Xarelto changed to heparin due to AKI, patient diuretics held patient placed on IV fluids.  Clinical Impression  Pt admitted with above diagnosis.  Pt currently with functional limitations due to the deficits listed below (see PT Problem List). Pt will benefit from skilled PT to increase their independence and safety with mobility to allow discharge to the venue listed below.  Pt's daughter present and reports pt has been doing well at home and has HHPT.  Pt using standard walker as she prefers this to RW for more stability.  Pt assisted with ambulating in hallway and fatigued quickly.  Recommend pt resume HHPT upon d/c.         Recommendations for follow up therapy are one component of a multi-disciplinary discharge planning process, led by the attending physician.  Recommendations may be updated based on patient status, additional functional criteria and insurance authorization.  Follow Up Recommendations Home health PT      Assistance Recommended at Discharge Intermittent Supervision/Assistance  Patient can return home with the following  A little help with walking and/or transfers;A little help with bathing/dressing/bathroom;Assistance with cooking/housework;Assist for transportation;Help with stairs or ramp for entrance    Equipment Recommendations None recommended by PT  Recommendations for Other Services       Functional Status Assessment Patient has had a recent decline in their functional status and demonstrates the ability to make significant improvements  in function in a reasonable and predictable amount of time.     Precautions / Restrictions Precautions Precautions: Fall Restrictions LLE Weight Bearing: Weight bearing as tolerated Other Position/Activity Restrictions: recent L femur fx on 04/26/22      Mobility  Bed Mobility               General bed mobility comments: pt in recliner    Transfers Overall transfer level: Needs assistance Equipment used: Standard walker Transfers: Sit to/from Stand Sit to Stand: Min guard           General transfer comment: cues for hand placement, effortful however no physical assist required    Ambulation/Gait Ambulation/Gait assistance: Min guard Gait Distance (Feet): 80 Feet Assistive device: Standard walker Gait Pattern/deviations: Step-through pattern, Decreased stride length, Decreased stance time - left, Antalgic       General Gait Details: antalgic gait however pt reports no pain, hx of recent left femur fx; fatigues quickly and required one short standing rest break  Stairs            Wheelchair Mobility    Modified Rankin (Stroke Patients Only)       Balance Overall balance assessment: History of Falls, Needs assistance         Standing balance support: Reliant on assistive device for balance, Bilateral upper extremity supported, During functional activity Standing balance-Leahy Scale: Poor                               Pertinent Vitals/Pain Pain Assessment Pain Assessment: No/denies pain Pain Intervention(s): Monitored during session, Repositioned    Home Living Family/patient expects to be discharged to:: Private residence  Available Help at Discharge: Family;Available 24 hours/day Type of Home: House Home Access: Ramped entrance       Home Layout: One level Home Equipment: Standard Walker      Prior Function Prior Level of Function : Needs assist             Mobility Comments: was working with [place] prior to this  admission, recent femur fx       Hand Dominance        Extremity/Trunk Assessment        Lower Extremity Assessment Lower Extremity Assessment: Generalized weakness       Communication   Communication: HOH  Cognition Arousal/Alertness: Awake/alert Behavior During Therapy: Flat affect Overall Cognitive Status: Within Functional Limits for tasks assessed                                 General Comments: daughter assisted with providing information; pt reports PLOF similar to previous admission, follows 1-2 step commands        General Comments      Exercises     Assessment/Plan    PT Assessment Patient needs continued PT services  PT Problem List Decreased strength;Decreased mobility;Decreased activity tolerance;Decreased balance;Decreased knowledge of use of DME       PT Treatment Interventions DME instruction;Balance training;Functional mobility training;Therapeutic exercise;Patient/family education;Therapeutic activities;Gait training    PT Goals (Current goals can be found in the Care Plan section)  Acute Rehab PT Goals Patient Stated Goal: return home with HHPT PT Goal Formulation: With patient/family Time For Goal Achievement: 06/26/22 Potential to Achieve Goals: Good    Frequency Min 3X/week     Co-evaluation               AM-PAC PT "6 Clicks" Mobility  Outcome Measure Help needed turning from your back to your side while in a flat bed without using bedrails?: A Little Help needed moving from lying on your back to sitting on the side of a flat bed without using bedrails?: A Little Help needed moving to and from a bed to a chair (including a wheelchair)?: A Little Help needed standing up from a chair using your arms (e.g., wheelchair or bedside chair)?: A Little Help needed to walk in hospital room?: A Little Help needed climbing 3-5 steps with a railing? : A Lot 6 Click Score: 17    End of Session Equipment Utilized During  Treatment: Gait belt Activity Tolerance: Patient tolerated treatment well Patient left: in chair;with call bell/phone within reach;with chair alarm set;with family/visitor present Nurse Communication: Mobility status PT Visit Diagnosis: Other abnormalities of gait and mobility (R26.89);Muscle weakness (generalized) (M62.81);History of falling (Z91.81)    Time: 7078-6754 PT Time Calculation (min) (ACUTE ONLY): 20 min   Charges:   PT Evaluation $PT Eval Low Complexity: 1 Low        Kati PT, DPT Physical Therapist Acute Rehabilitation Services Preferred contact method: Secure Chat Weekend Pager Only: [phone] Office: [place] 06/12/2022, 12:32 PM

## 2022-06-12 NOTE — TOC Transition Note (Signed)
Transition of Care [facility]) - CM/SW Discharge Note   Patient Details  Name: Brandy Gonzales MRN: [id number] Date of Birth: 23-Dec-1940  [facility] CM/SW Contact:  Vassie Moselle, LCSW Phone Number: 06/12/2022, 11:20 AM   Clinical Narrative:    Met with pt and daughter in room who share that this pt is currently receiving [facility] services, however daughter is unsure of the name of the agency. She shares that HHPT/OT/SW has contacted her yesterday and today regarding visits. She shares that SW is to assist pt with applying for MCD.  Per chart review pt receiving HH through [facility]. Malachy Mood with [facility] confirmed pt is active with their HHA and is currently receiving HHPT/RN. HH orders will need to be placed prior to discharge. MD notified.    Final next level of care: Home w Home Health Services Barriers to Discharge: No Barriers Identified   Patient Goals and CMS Choice CMS Medicare.gov Compare Post Acute Care list provided to:: Patient Choice offered to / list presented to : Patient, Adult Children  Discharge Placement                  Patient to be transferred to facility by: Family Name of family member notified: Denyse Dago Patient and family notified of of transfer: 06/12/22  Discharge Plan and Services Additional resources added to the After Visit Summary for                  DME Arranged: N/A DME Agency: NA       HH Arranged: PT, RN [place] Agency: [place] Date [place]: 06/12/22 Time HH Agency Contacted: 1120 Representative spoke with at [place]: [place] Determinants of Health ([place]) Interventions [place]: No Food Insecurity (06/11/2022)  Housing: Low Risk  (06/11/2022)  Transportation Needs: No Transportation Needs (04/25/2022)  Utilities: Not At Risk (04/25/2022)  Alcohol Screen: Low Risk  (03/24/2021)  Depression (PHQ2-9): Medium Risk (01/21/2022)  Financial Resource Strain: Low Risk   (03/24/2021)  Physical Activity: Inactive (03/24/2021)  Social Connections: Moderately Isolated (03/24/2021)  Stress: No Stress Concern Present (03/24/2021)  Tobacco Use: Low Risk  (05/19/2022)     Readmission Risk Interventions    06/12/2022   11:19 AM 05/20/2022    4:35 PM  Readmission Risk Prevention Plan  Transportation Screening Complete Complete  PCP or Specialist Appt within 3-5 Days Complete Complete  HRI or Home Care Consult Complete   Social Work Consult for [place] Planning/Counseling Complete Complete  Palliative Care Screening Not Applicable Not Applicable  Medication Review Press photographer) Complete Complete

## 2022-06-12 NOTE — Progress Notes (Signed)
Discharge information provided, all questions answered. PIVs removed. Belongings returned. Patient assisted to main entrance via wheelchair. Patient assisted to vehicle safely. Butch Penny (patient's daughter) driving patient home.

## 2022-06-12 NOTE — Telephone Encounter (Addendum)
Connected with Olimpia Tinch Bradstreet's daughter Brandy Gonzales [phone]) regarding email response. "I am her poa which is why I have missed some time off. Am I still allowed to fill your forms out?I guess I need it so I can take her to her appointments mainly & for times like yesterday & today when she is admitted to the hospital. So far I missed time when she was at [facility] 1/8 - 1/11  D Simon Rhein Fraud Specialist  [address],  [place], [place] [address]  p: [phone] ext. [phone]  e: [email or web address]     m: [phone]   f: [phone]"  Medical Oncologist notified of request of new patient established 06/10/2022.   This nurse advised Brandy Gonzales to provide her FMLA request to Hospitalist per [facility] provider.   "He does not know anything about her or this.  I do not know what I'm doing."   PCP provider is an option.  [facility] is managing only a small part of her health and wellness with Xarelto for DVT's not the hospitalizations.  Yes, you are allowed to sign forms because you are her POA.  You need to complete employee sections and sign PHE ROI to request PHI and sign release of PHI for hospital or PCP. Currently no further questions or needs.

## 2022-06-12 NOTE — Progress Notes (Signed)
ANTICOAGULATION CONSULT NOTE  Pharmacy Consult for heparin Indication: Bridge therapy while Xarelto for DVT on hold for AKI  Allergies  Allergen Reactions   Amlodipine Besylate Other (See Comments)    Hair loss   Atenolol Other (See Comments)    Fluid retention   Oxycodone Hcl Other (See Comments)    Dizziness    Propoxyphene N-Acetaminophen Other (See Comments)    Unknown reaction   Enalapril Maleate Palpitations    Patient Measurements:   Heparin Dosing Weight: 52.3 kg (TBW)  Vital Signs: Temp: 98 F (36.7 C) (02/01 1402) Temp Source: Oral (02/01 1402) BP: 116/53 (02/01 1402) Pulse Rate: 68 (02/01 1402)  Labs: Recent Labs    06/10/22 1332 06/10/22 1930 06/10/22 1930 06/11/22 0540 06/11/22 1413 06/11/22 2256  HGB 13.0  --   --  11.3*  --   --   HCT 36.4  --   --  34.4*  --   --   PLT 203  --   --  171  --   --   APTT  --  51*   < > 59* 90* 67*  HEPARINUNFRC  --  >1.10*  --  >1.10*  --   --   CREATININE 2.63*  --   --  2.10*  --   --    < > = values in this interval not displayed.     Estimated Creatinine Clearance: 15.3 mL/min (A) (by C-G formula based on SCr of 2.1 mg/dL (H)).   Assessment: 34 yoF on Xarelto for recent DVT in December, admitted from hematologist's office for AKI. Xarelto held and Pharmacy consulted to dose IV heparin.  Baseline aPTT mildly elevated; heparin level grossly elevated as expected after DOAC Prior anticoagulation: Xarelto 20 mg daily; last dose 1/30  Significant events:  Today, 06/12/2022: CBC: 2/1: Hgb slightly down from admission (likely dilutional from fluid resuscitation); Plt stable WNL aPTT 67 sec-therapeutic on 500 units/hr; heparin level remains supratherapeutic as expected above No bleeding or infusion issues per nursing  Goal of Therapy: Heparin level 0.3-0.7 units/ml Monitor platelets by anticoagulation protocol: Yes  Plan: Continue heparin IV infusion at 500 units/hr Daily CBC, daily heparin level once  stable; daily aPTT until correlates with heparin level Monitor for signs of bleeding or thrombosis  Netta Cedars, PharmD, BCPS 06/12/2022, 12:41 AM

## 2022-06-12 NOTE — Progress Notes (Addendum)
ANTICOAGULATION CONSULT NOTE  Pharmacy Consult for heparin Indication: Bridge therapy while Xarelto for DVT on hold for AKI  Allergies  Allergen Reactions   Amlodipine Besylate Other (See Comments)    Hair loss   Atenolol Other (See Comments)    Fluid retention   Oxycodone Hcl Other (See Comments)    Dizziness    Propoxyphene N-Acetaminophen Other (See Comments)    Unknown reaction   Enalapril Maleate Palpitations    Patient Measurements:   Heparin Dosing Weight: 52.3 kg (TBW)  Vital Signs: Temp: 98.4 F (36.9 C) (02/02 0327) Temp Source: Oral (02/02 0327) BP: 133/58 (02/02 0327) Pulse Rate: 69 (02/02 0327)  Labs: Recent Labs    06/10/22 1332 06/10/22 1930 06/10/22 1930 06/11/22 0540 06/11/22 1413 06/11/22 2256 06/12/22 0452  HGB 13.0  --   --  11.3*  --   --  9.8*  HCT 36.4  --   --  34.4*  --   --  30.3*  PLT 203  --   --  171  --   --  150  APTT  --  51*   < > 59* 90* 67* 91*  HEPARINUNFRC  --  >1.10*  --  >1.10*  --   --  0.65  CREATININE 2.63*  --   --  2.10*  --   --  1.64*   < > = values in this interval not displayed.     Estimated Creatinine Clearance: 19.5 mL/min (A) (by C-G formula based on SCr of 1.64 mg/dL (H)).   Assessment: 44 yoF on Xarelto for recent DVT in December, admitted from hematologist's office for AKI. Xarelto held and Pharmacy consulted to dose IV heparin.  Baseline aPTT mildly elevated; heparin level grossly elevated as expected after DOAC Prior anticoagulation: Xarelto 20 mg daily; last dose 1/30  Significant events:  Today, 06/12/2022: CBC: Hgb continues to drop from admission (likely dilutional from fluid resuscitation); Plt WNL but also slightly decreased aPTT therapeutic on 500 units/hr; heparin level trending down as expected, but too early to tell if DOAC effects have dissipated SCr continues to improve with fluids; approaching baseline (~1.5) No bleeding or infusion issues per nursing  Goal of Therapy: Heparin level  0.3-0.7 units/ml aPTT 66-102 sec Monitor platelets by anticoagulation protocol: Yes  Plan: Continue heparin IV infusion at 500 units/hr Daily CBC, daily heparin level once stable; daily aPTT until correlating with heparin level Monitor for signs of bleeding or thrombosis F/u for ability to transition back to Xarelto - if SCr baseline is truly ~1.5, would not expect her to improve much more, consider transitioning back today as appropriate  Reuel Boom, PharmD, BCPS [phone] 06/12/2022, 7:24 AM

## 2022-06-12 NOTE — Discharge Summary (Signed)
Physician Discharge Summary  Brandy Gonzales DXA:[id number] DOB: December 27, 1940 DOA: 06/10/2022  PCP: Cassandria Anger, MD  Admit date: 06/10/2022 Discharge date: 06/12/2022  Time spent: 55 minutes  Recommendations for Outpatient Follow-up:  Follow-up with [name], Evie Lacks, MD as scheduled in 1 week.  On follow-up patient will need a basic metabolic profile done to follow-up on electrolytes and renal function.  Patient need a CBC done to follow-up on H&H.  Patient's lower extremity DVT will need to be followed up upon.  Patient's diabetes will also need to be followed up upon.   Discharge Diagnoses:  Principal Problem:   AKI (acute kidney injury) ([place]) Active Problems:   Uncontrolled type 2 diabetes mellitus with hyperglycemia, without long-term current use of insulin ([facility])   Essential hypertension   GERD   Stage 3b chronic kidney disease (CKD) ([place])   DVT (deep venous thrombosis) ([place])   Anemia   Discharge Condition: Stable and improved.  Diet recommendation: Carb modified diet  There were no vitals filed for this visit.  History of present illness:  HPI per Dr. Anola Gurney Brandy Gonzales is a 82 y.o. female with medical history significant of DVT, HTN, DM2, CKD3b. Presenting with lab abnormalities. She was following up with her hematologist this morning when they noted that her creatinine was elevated from her baseline. They were concerned and sent her to the ED for evaluation. She notes that she has been in her normal state of health since her discharge from the hospital. Other than increased urinary output, she has not had any new symptoms since discharge. She has had decent PO intake. She denies any change in her meds except her xarelto and some vitamins. She reports compliance with her regimen. She denies any other aggravating or alleviating factors.    Hospital Course:  #1 acute kidney injury on CKD stage IIIb -Likely secondary to prerenal azotemia in the setting of  torsemide. -Patient noted to have recently been discharged on 03/22/2023 from the hospital for acute extensive DVT involving left iliac vein, common femoral vein and noted to have failed outpatient treatment with Eliquis and discharged on Xarelto.  Patient also discharged on torsemide as needed for lower extremity edema however per daughter after patient was sent home from skilled nursing facility instructions were to take torsemide twice daily which patient was getting. -Baseline creatinine approximately 1.5-1.7. -Creatinine on admission noted at 2.63. -Urinalysis done on admission with small leukocytes, nitrite negative, many bacteria, 6-10 WBCs. -Renal ultrasound to rule out hydronephrosis was ordered and was pending by day of discharge.. -Renal function improved with hydration. -Creatinine trended down and was back to baseline at 1.64 by day of discharge.  -Torsemide discontinued and will not be resumed on discharge.  -Outpatient follow-up with PCP.    2.  Acute extensive DVT involving left iliac, common femoral vein -During recent hospitalization noted to have failed outpatient Eliquis. -Patient noted to have been put this on Xarelto per discharge summary after discussion with hematology/oncology. -Status post IVC filter placement. -Status post mechanical thrombectomy per vascular surgery during last hospitalization. -Patient initially maintained on heparin due to AKI and as renal function improved and back to baseline Xarelto was resumed.   -Outpatient follow-up with PCP.    3.  Diabetes mellitus type 2 with hyperglycemia -Hemoglobin A1c noted at 7.8 on 04/22/2022. -Patient's oral hypoglycemic agents were held during the hospitalization and patient maintained on sliding scale insulin.   -Outpatient follow-up with PCP.    4.  Hypertension -Patient  placed back on home regimen Coreg.    5.  Recent left hip fracture -PT/OT assessed patient and recommended resumption of home health  therapies.. -Outpatient follow-up with orthopedics as previously scheduled..   6.  Anemia of chronic disease -Patient with no overt bleeding. -Anemia panel consistent with anemia of chronic disease.  Folate at 34.4.  Vitamin B12 at 2152. -Hemoglobin stabilized at 9.8 by day of discharge which is close to patient's baseline.  -Outpatient follow-up with PCP.    7.  Hypokalemia -Likely secondary to diuretics.   - Repleted during the hospitalization.    Procedures: None  Consultations: None  Discharge Exam: Vitals:   06/12/22 0327 06/12/22 0816  BP: (!) 133/58 (!) 146/68  Pulse: 69   Resp: 16   Temp: 98.4 F (36.9 C)   SpO2: 100%     General: NAD Cardiovascular: RRR no murmurs rubs or gallops.  No JVD.  No lower extremity edema. Respiratory: Clear to auscultation bilaterally.  No wheezes, no crackles, no rhonchi.  Fair air movement.  Discharge Instructions   Discharge Instructions     Diet Carb Modified   Complete by: As directed    Increase activity slowly   Complete by: As directed    No wound care   Complete by: As directed       Allergies as of 06/12/2022       Reactions   Amlodipine Besylate Other (See Comments)   Hair loss   Atenolol Other (See Comments)   Fluid retention   Oxycodone Hcl Other (See Comments)   Dizziness   Propoxyphene N-acetaminophen Other (See Comments)   Unknown reaction   Enalapril Maleate Palpitations        Medication List     STOP taking these medications    torsemide 20 MG tablet Commonly known as: DEMADEX       TAKE these medications    acetaminophen 500 MG tablet Commonly known as: TYLENOL Take 500 mg by mouth at bedtime.   carvedilol 25 MG tablet Commonly known as: COREG Take 1 tablet (25 mg total) by mouth 2 (two) times daily with a meal. What changed:  how much to take when to take this   cyanocobalamin 1000 MCG tablet Take 1 tablet (1,000 mcg total) by mouth daily.   dapagliflozin propanediol 10  MG Tabs tablet Commonly known as: Farxiga Take 1 tablet (10 mg total) by mouth daily before breakfast.   folic acid 1 MG tablet Commonly known as: FOLVITE Take 1 tablet (1 mg total) by mouth daily.   glimepiride 2 MG tablet Commonly known as: AMARYL Take 1 tablet (2 mg total) by mouth daily before breakfast.   mupirocin ointment 2 % Commonly known as: BACTROBAN On leg wound w/dressing change qd or bid What changed:  how much to take how to take this when to take this additional instructions   omeprazole 20 MG capsule Commonly known as: PRILOSEC Take 1 capsule (20 mg total) by mouth daily.   rivaroxaban 20 MG Tabs tablet Commonly known as: XARELTO Take 1 tablet (20 mg total) by mouth daily with supper. Start taking on: June 13, 2022   thiamine 100 MG tablet Commonly known as: Vitamin B-1 Take 1 tablet (100 mg total) by mouth daily. What changed: when to take this   Vitamin D3 50 MCG (2000 UT) capsule Take 1 capsule (2,000 Units total) by mouth daily.       Allergies  Allergen Reactions   Amlodipine Besylate Other (See Comments)  Hair loss   Atenolol Other (See Comments)    Fluid retention   Oxycodone Hcl Other (See Comments)    Dizziness    Propoxyphene N-Acetaminophen Other (See Comments)    Unknown reaction   Enalapril Maleate Palpitations    Follow-up Information     [name], Evie Lacks, MD Follow up.   Specialty: Internal Medicine Why: f/u as scheduled next week. Contact information: [address] [place] [address] [phone]                  The results of significant diagnostics from this hospitalization (including imaging, microbiology, ancillary and laboratory) are listed below for reference.    Significant Diagnostic Studies: PERIPHERAL VASCULAR CATHETERIZATION  Result Date: 05/18/2022 See surgical note for result.  CT VENOGRAM ABD/PELVIS/LOWER EXT BILAT  Result Date: 05/18/2022 CLINICAL DATA:  Concern for  central obstruction on ultrasound today EXAM: CT VENOGRAM ABDOMEN AND PELVIS AND LOWER EXTREMITY BILATERAL TECHNIQUE: RADIATION DOSE REDUCTION: This exam was performed according to the departmental dose-optimization program which includes automated exposure control, adjustment of the mA and/or kV according to patient size and/or use of iterative reconstruction technique. CONTRAST:  145mL OMNIPAQUE IOHEXOL 350 MG/ML SOLN COMPARISON:  None Available. FINDINGS: Lower chest: Bibasilar atelectasis/scarring. 1.8 cm triangular consolidation in the right lower lobe posteriorly is not significantly changed from 01/22/2011. No acute abnormality. Hepatobiliary: Intrahepatic biliary dilation greatest in the right hepatic lobe is similar to slightly increased from 2012. Unremarkable gallbladder. No dilation of the common bile duct. No suspicious focal hepatic lesion. Pancreas: Mild prominence of the pancreatic duct in the pancreatic head measuring 5 mm. No evidence of acute inflammation. Spleen: Unremarkable. Adrenals/Urinary Tract: Normal adrenal glands. Low-attenuation lesions in the kidneys are statistically likely to represent cysts. No follow-up is required. Bilateral cortical renal scarring. Urinary calculi or hydronephrosis. Unremarkable bladder. Stomach/Bowel: Colonic diverticulosis without diverticulitis. Normal caliber large and small bowel. Normal appendix. Unremarkable stomach. Arterial/lymphatic: Aortic atherosclerosis. No enlarged abdominal or pelvic lymph nodes. Reproductive: Hysterectomy.  No adnexal mass. Other: Small amount of free fluid in the pelvis. No free intraperitoneal air. Musculoskeletal: IM rod and screw fixations of the left femur and proximal tibia. No acute fracture. Body wall edema. IVC: IVC filter. The IVC is patent. No thrombus or stenosis in the IVC. Portal and mesenteric veins: No evidence for thrombus or stenosis. Bilateral iliac veins: Expansile occlusive thrombus in the proximal left common  iliac vein extending into the left external iliac vein and common femoral vein. The right iliac veins are patent. Right lower extremity: No evidence for thrombus involving the common femoral, and femoral vein. Popliteal and visualized deep calf veins Left lower extremity: Nonocclusive thrombus is visualized within the common femoral and femoral vein. No definite clot in the popliteal or visualized deep calf veins. IMPRESSION: Acute occlusive thrombus in the left common iliac vein. Nonocclusive thrombus extends into the left external iliac, common femoral and superficial femoral veins. Findings are suggestive of May-Thurner syndrome. IVC filter in the infrarenal IVC. These results were called by telephone at the time of interpretation on 05/18/2022 at 1:51 am to provider [facility] , who verbally acknowledged these results. Electronically Signed   By: Placido Sou M.D.   On: 05/18/2022 01:53   US Venous Img Lower Unilateral Left  Result Date: 05/17/2022 CLINICAL DATA:  DVT, progressive left lower extremity swelling EXAM: LEFT LOWER EXTREMITY VENOUS DOPPLER ULTRASOUND TECHNIQUE: Gray-scale sonography with compression, as well as color and duplex ultrasound, were performed to evaluate the  deep venous system(s) from the level of the common femoral vein through the popliteal and proximal calf veins. COMPARISON:  None Available. FINDINGS: VENOUS Normal compressibility of the common femoral, superficial femoral, and popliteal veins, as well as the visualized calf veins. Visualized portions of profunda femoral vein and great saphenous vein unremarkable. No filling defects to suggest DVT on grayscale or color Doppler imaging. Doppler waveforms show normal direction of venous flow and response to augmentation. There is, however, lack of respiratory variation within the left common femoral vein which suggests a more central obstruction. Limited views of the contralateral common femoral vein are unremarkable. OTHER None.  Limitations: none IMPRESSION: 1. No evidence of femoropopliteal DVT within the left lower extremity. There is, however, lack of respiratory variation within the left common femoral vein which suggests a more central obstruction. If indicated, this would be better assessed with CT venography of the abdomen and pelvis. Electronically Signed   By: Fidela Salisbury M.D.   On: 05/17/2022 23:20    Microbiology: No results found for this or any previous visit (from the past 240 hour(s)).   Labs: Basic Metabolic Panel: Recent Labs  Lab 06/10/22 1332 06/11/22 0540 06/11/22 0548 06/12/22 0452  NA 139 139  --  141  K 4.1 3.4*  --  3.6  CL 99 104  --  113*  CO2 30 22  --  22  GLUCOSE 171* 79  --  108*  BUN 65* 64*  --  44*  CREATININE 2.63* 2.10*  --  1.64*  CALCIUM 10.4* 9.2  --  8.8*  MG  --   --  2.0  --    Liver Function Tests: Recent Labs  Lab 06/10/22 1332 06/11/22 0540  AST 16 19  ALT 15 15  ALKPHOS 135* 104  BILITOT 1.6* 1.1  PROT 7.3 6.0*  ALBUMIN 4.0 3.3*   No results for input(s): "LIPASE", "AMYLASE" in the last 168 hours. No results for input(s): "AMMONIA" in the last 168 hours. CBC: Recent Labs  Lab 06/10/22 1332 06/11/22 0540 06/12/22 0452  WBC 8.0 6.4 5.1  NEUTROABS 4.8  --   --   HGB 13.0 11.3* 9.8*  HCT 36.4 34.4* 30.3*  MCV 87.3 92.0 92.9  PLT 203 171 150   Cardiac Enzymes: No results for input(s): "CKTOTAL", "CKMB", "CKMBINDEX", "TROPONINI" in the last 168 hours. BNP: BNP (last 3 results) No results for input(s): "BNP" in the last 8760 hours.  ProBNP (last 3 results) No results for input(s): "PROBNP" in the last 8760 hours.  CBG: Recent Labs  Lab 06/11/22 1517 06/11/22 1743 06/11/22 2145 06/12/22 0742 06/12/22 1055  GLUCAP 105* 105* 148* 131* 211*       Signed:  Irine Seal MD.  [facility] 06/12/2022, 2:23 PM

## 2022-06-15 ENCOUNTER — Telehealth: Payer: Self-pay | Admitting: *Deleted

## 2022-06-15 ENCOUNTER — Encounter: Payer: Self-pay | Admitting: *Deleted

## 2022-06-15 NOTE — Patient Outreach (Signed)
Care Coordination [facility] Note Transition Care Management Follow-up Telephone Call Date of discharge and from where: Friday, 06/12/22, [facility]; AKI How have you been since you were released from the hospital? Per daughter/ caregiver Butch Penny, verified on media tab [facility] POA: "Things are going okay after the hospital visit.  My niece lives with her and I check in with her several times each week.  I take her to all of her doctor appointments and together we handle all of medications.  Thank you for telling me about this form I need to fill out-- I appreciate that information."     Any questions or concerns? No  Items Reviewed: Did the pt receive and understand the discharge instructions provided? Yes - thoroughly reviewed all aspects of hospital discharge instructions/ AVS with caregiver who verbalizes good understanding of same Medications obtained and verified? Yes  Other? No  Any new allergies since your discharge? No  Dietary orders reviewed? Yes Do you have support at home? Yes  patient requires assistance with all care needs; daughter assists as indicated; patient resides with niece who assists as well  Home Care and Equipment/Supplies: Were home health services ordered? yes If so, what is the name of the agency? [facility]- caregiver reports patient was previously established with agency and she called yesterday and confirmed services would resume this week  Has the agency set up a time to come to the patient's home? no Were any new equipment or medical supplies ordered?  No What is the name of the medical supply agency? N/A Were you able to get the supplies/equipment? not applicable Do you have any questions related to the use of the equipment or supplies? No N/A  Functional Questionnaire: (I = Independent and D = Dependent) ADLs: A  patient requires assistance with all care needs  Bathing/Dressing- A  patient requires assistance with all care needs  Meal Prep- D  Eating-  I  Maintaining continence- A  Transferring/Ambulation- D  Managing Meds- D family manages all aspects of medication management/ administration  Follow up appointments reviewed:  PCP Hospital f/u appt confirmed? Yes  Scheduled to see PCP on Thursday 06/18/22 @ 4:00 pm Specialist Hospital f/u appt confirmed? No  Scheduled to see - on - @ - Are transportation arrangements needed? No  If their condition worsens, is the pt aware to call PCP or go to the Emergency Dept.? Yes Was the patient provided with contact information for the PCP's office or ED? No- caregiver declined- reports already has contact information for all care providers Was to pt encouraged to call back with questions or concerns? Yes  SDOH assessments and interventions completed:   Yes SDOH Interventions Today    Flowsheet Row Most Recent Value  SDOH Interventions   Food Insecurity Interventions Intervention Not Indicated  Transportation Interventions Intervention Not Indicated  [family provides transportation]      Interventions Today    Flowsheet Row Most Recent Value  General Interventions   General Interventions Discussed/Reviewed General Interventions Discussed, Doctor Visits  Doctor Visits Discussed/Reviewed Doctor Visits Discussed, PCP  PCP/Specialist Visits Compliance with follow-up visit  Education Interventions   Education Provided Provided Verbal Education  [value/ importance of completing DPR form]  Nutrition Interventions   Nutrition Discussed/Reviewed Nutrition Discussed  Pharmacy Interventions   Pharmacy Dicussed/Reviewed Pharmacy Topics Discussed  [confirmed caregivers manage medications,  confirmed post-hospital discharge medication changes/ instructions have been initiated,  caregiver declined full medicaiton review and denies questions/ concerns aorund medications]  Care Coordination Interventions:  Provided education around value/ purpose of having DPR form completed    Encounter  Outcome:  Pt. Visit Completed    Oneta Rack, RN, BSN, CCRN Alumnus RN CM Care Coordination/ Transition of [place] Management [phone]: direct office

## 2022-06-17 ENCOUNTER — Ambulatory Visit (INDEPENDENT_AMBULATORY_CARE_PROVIDER_SITE_OTHER): Payer: Self-pay | Admitting: Nurse Practitioner

## 2022-06-17 ENCOUNTER — Encounter (INDEPENDENT_AMBULATORY_CARE_PROVIDER_SITE_OTHER): Payer: Self-pay

## 2022-06-18 ENCOUNTER — Ambulatory Visit (INDEPENDENT_AMBULATORY_CARE_PROVIDER_SITE_OTHER): Payer: Medicare Other | Admitting: Internal Medicine

## 2022-06-18 ENCOUNTER — Encounter: Payer: Self-pay | Admitting: Internal Medicine

## 2022-06-18 ENCOUNTER — Other Ambulatory Visit: Payer: Self-pay | Admitting: *Deleted

## 2022-06-18 VITALS — BP 120/82 | HR 65 | Temp 98.3°F | Ht 59.0 in | Wt 119.0 lb

## 2022-06-18 DIAGNOSIS — Z7901 Long term (current) use of anticoagulants: Secondary | ICD-10-CM

## 2022-06-18 DIAGNOSIS — I10 Essential (primary) hypertension: Secondary | ICD-10-CM

## 2022-06-18 DIAGNOSIS — M79672 Pain in left foot: Secondary | ICD-10-CM

## 2022-06-18 DIAGNOSIS — E1165 Type 2 diabetes mellitus with hyperglycemia: Secondary | ICD-10-CM | POA: Diagnosis not present

## 2022-06-18 DIAGNOSIS — N1832 Chronic kidney disease, stage 3b: Secondary | ICD-10-CM

## 2022-06-18 DIAGNOSIS — I82412 Acute embolism and thrombosis of left femoral vein: Secondary | ICD-10-CM | POA: Diagnosis not present

## 2022-06-18 DIAGNOSIS — I82422 Acute embolism and thrombosis of left iliac vein: Secondary | ICD-10-CM

## 2022-06-18 DIAGNOSIS — E1121 Type 2 diabetes mellitus with diabetic nephropathy: Secondary | ICD-10-CM | POA: Diagnosis not present

## 2022-06-18 DIAGNOSIS — S2249XD Multiple fractures of ribs, unspecified side, subsequent encounter for fracture with routine healing: Secondary | ICD-10-CM | POA: Diagnosis not present

## 2022-06-18 DIAGNOSIS — M79671 Pain in right foot: Secondary | ICD-10-CM | POA: Diagnosis not present

## 2022-06-18 DIAGNOSIS — S72142D Displaced intertrochanteric fracture of left femur, subsequent encounter for closed fracture with routine healing: Secondary | ICD-10-CM | POA: Diagnosis not present

## 2022-06-18 DIAGNOSIS — S42001D Fracture of unspecified part of right clavicle, subsequent encounter for fracture with routine healing: Secondary | ICD-10-CM | POA: Diagnosis not present

## 2022-06-18 DIAGNOSIS — Z86718 Personal history of other venous thrombosis and embolism: Secondary | ICD-10-CM

## 2022-06-18 DIAGNOSIS — S79192D Other physeal fracture of lower end of left femur, subsequent encounter for fracture with routine healing: Secondary | ICD-10-CM | POA: Diagnosis not present

## 2022-06-18 MED ORDER — RIVAROXABAN 20 MG PO TABS: 20.0000 mg | ORAL_TABLET | Freq: Every day | ORAL | 1 refills | Status: DC

## 2022-06-18 NOTE — Progress Notes (Signed)
Subjective:  Patient ID: Brandy Gonzales, female    DOB: 1941/02/29  Age: 82 y.o. MRN: [id number]  CC: Follow-up   HPI Brandy Gonzales presents for a f/u - ARF/CRF, DVT, DM She is here with her dtr Andris Flurry  Per hx: "Admit date: 06/10/2022 Discharge date: 06/12/2022   Time spent: 55 minutes   Recommendations for Outpatient Follow-up:  Follow-up with Ruwayda Curet, Evie Lacks, MD as scheduled in 1 week.  On follow-up patient will need a basic metabolic profile done to follow-up on electrolytes and renal function.  Patient need a CBC done to follow-up on H&H.  Patient's lower extremity DVT will need to be followed up upon.  Patient's diabetes will also need to be followed up upon.     Discharge Diagnoses:  Principal Problem:   AKI (acute kidney injury) ([place]) Active Problems:   Uncontrolled type 2 diabetes mellitus with hyperglycemia, without long-term current use of insulin (HCC)   Essential hypertension   GERD   Stage 3b chronic kidney disease (CKD) ([place])   DVT (deep venous thrombosis) ([place])   Anemia     Discharge Condition: Stable and improved.   Diet recommendation: Carb modified diet   There were no vitals filed for this visit.   History of present illness:  HPI per Dr. Anola Gurney Brandy Gonzales is a 82 y.o. female with medical history significant of DVT, HTN, DM2, CKD3b. Presenting with lab abnormalities. She was following up with her hematologist this morning when they noted that her creatinine was elevated from her baseline. They were concerned and sent her to the ED for evaluation. She notes that she has been in her normal state of health since her discharge from the hospital. Other than increased urinary output, she has not had any new symptoms since discharge. She has had decent PO intake. She denies any change in her meds except her xarelto and some vitamins. She reports compliance with her regimen. She denies any other aggravating or alleviating factors.     Hospital Course:  #1  acute kidney injury on CKD stage IIIb -Likely secondary to prerenal azotemia in the setting of torsemide. -Patient noted to have recently been discharged on 03/22/2023 from the hospital for acute extensive DVT involving left iliac vein, common femoral vein and noted to have failed outpatient treatment with Eliquis and discharged on Xarelto.  Patient also discharged on torsemide as needed for lower extremity edema however per daughter after patient was sent home from skilled nursing facility instructions were to take torsemide twice daily which patient was getting. -Baseline creatinine approximately 1.5-1.7. -Creatinine on admission noted at 2.63. -Urinalysis done on admission with small leukocytes, nitrite negative, many bacteria, 6-10 WBCs. -Renal ultrasound to rule out hydronephrosis was ordered and was pending by day of discharge.. -Renal function improved with hydration. -Creatinine trended down and was back to baseline at 1.64 by day of discharge.  -Torsemide discontinued and will not be resumed on discharge.  -Outpatient follow-up with PCP.    2.  Acute extensive DVT involving left iliac, common femoral vein -During recent hospitalization noted to have failed outpatient Eliquis. -Patient noted to have been put this on Xarelto per discharge summary after discussion with hematology/oncology. -Status post IVC filter placement. -Status post mechanical thrombectomy per vascular surgery during last hospitalization. -Patient initially maintained on heparin due to AKI and as renal function improved and back to baseline Xarelto was resumed.   -Outpatient follow-up with PCP.    3.  Diabetes mellitus type 2  with hyperglycemia -Hemoglobin A1c noted at 7.8 on 04/22/2022. -Patient's oral hypoglycemic agents were held during the hospitalization and patient maintained on sliding scale insulin.   -Outpatient follow-up with PCP.    4.  Hypertension -Patient placed back on home regimen Coreg.    5.   Recent left hip fracture -PT/OT assessed patient and recommended resumption of home health therapies.. -Outpatient follow-up with orthopedics as previously scheduled..   6.  Anemia of chronic disease -Patient with no overt bleeding. -Anemia panel consistent with anemia of chronic disease.  Folate at 34.4.  Vitamin B12 at 2152. -Hemoglobin stabilized at 9.8 by day of discharge which is close to patient's baseline.  -Outpatient follow-up with PCP.    7.  Hypokalemia -Likely secondary to diuretics.   - Repleted during the hospitalization."          Vitals:    06/12/22 0327 06/12/22 0816  BP: (!) 133/58 (!) 146/68  Pulse: 69    Resp: 16    Temp: 98.4 F (36.9 C)    SpO2: 100%       Outpatient Medications Prior to Visit  Medication Sig Dispense Refill   acetaminophen (TYLENOL) 500 MG tablet Take 500 mg by mouth at bedtime.     carvedilol (COREG) 25 MG tablet Take 1 tablet (25 mg total) by mouth 2 (two) times daily with a meal. (Patient taking differently: Take 50 mg by mouth daily.) 180 tablet 3   Cholecalciferol (VITAMIN D3) 50 MCG (2000 UT) capsule Take 1 capsule (2,000 Units total) by mouth daily. 100 capsule 3   cyanocobalamin 1000 MCG tablet Take 1 tablet (1,000 mcg total) by mouth daily.     dapagliflozin propanediol (FARXIGA) 10 MG TABS tablet Take 1 tablet (10 mg total) by mouth daily before breakfast. 90 tablet 3   folic acid (FOLVITE) 1 MG tablet Take 1 tablet (1 mg total) by mouth daily.     glimepiride (AMARYL) 2 MG tablet Take 1 tablet (2 mg total) by mouth daily before breakfast. 90 tablet 3   mupirocin ointment (BACTROBAN) 2 % On leg wound w/dressing change qd or bid (Patient taking differently: Apply 1 Application topically daily.) 60 g 0   omeprazole (PRILOSEC) 20 MG capsule Take 1 capsule (20 mg total) by mouth daily. 90 capsule 3   thiamine (VITAMIN B-1) 100 MG tablet Take 1 tablet (100 mg total) by mouth daily. (Patient taking differently: Take 100 mg by mouth at  bedtime.)     rivaroxaban (XARELTO) 20 MG TABS tablet Take 1 tablet (20 mg total) by mouth daily with supper. 90 tablet 1   No facility-administered medications prior to visit.    ROS: Review of Systems  Constitutional:  Negative for activity change, appetite change, chills, fatigue and unexpected weight change.  HENT:  Negative for congestion, mouth sores and sinus pressure.   Eyes:  Negative for visual disturbance.  Respiratory:  Negative for cough and chest tightness.   Gastrointestinal:  Negative for abdominal pain and nausea.  Genitourinary:  Negative for difficulty urinating, frequency and vaginal pain.  Musculoskeletal:  Negative for back pain and gait problem.  Skin:  Negative for pallor and rash.  Neurological:  Negative for dizziness, tremors, weakness, numbness and headaches.  Psychiatric/Behavioral:  Negative for confusion and sleep disturbance.     Objective:  BP 120/82 (BP Location: Left Arm, Patient Position: Sitting, Cuff Size: Normal)   Pulse 65   Temp 98.3 F (36.8 C) (Oral)   Ht 4' 11"$  (1.499 m)  Wt 119 lb (54 kg)   SpO2 100%   BMI 24.04 kg/m   BP Readings from Last 3 Encounters:  06/18/22 120/82  06/12/22 (!) 146/68  06/10/22 125/67    Wt Readings from Last 3 Encounters:  06/18/22 119 lb (54 kg)  06/10/22 115 lb 6.4 oz (52.3 kg)  05/20/22 128 lb 1.4 oz (58.1 kg)    Physical Exam Constitutional:      General: She is not in acute distress.    Appearance: She is well-developed. She is obese.  HENT:     Head: Normocephalic.     Right Ear: External ear normal.     Left Ear: External ear normal.     Nose: Nose normal.  Eyes:     General:        Right eye: No discharge.        Left eye: No discharge.     Conjunctiva/sclera: Conjunctivae normal.     Pupils: Pupils are equal, round, and reactive to light.  Neck:     Thyroid: No thyromegaly.     Vascular: No JVD.     Trachea: No tracheal deviation.  Cardiovascular:     Rate and Rhythm: Normal  rate and regular rhythm.     Heart sounds: Normal heart sounds.  Pulmonary:     Effort: No respiratory distress.     Breath sounds: No stridor. No wheezing.  Abdominal:     General: Bowel sounds are normal. There is no distension.     Palpations: Abdomen is soft. There is no mass.     Tenderness: There is no abdominal tenderness. There is no guarding or rebound.  Musculoskeletal:        General: No tenderness.     Cervical back: Normal range of motion and neck supple. No rigidity.  Lymphadenopathy:     Cervical: No cervical adenopathy.  Skin:    Findings: No erythema or rash.  Neurological:     Cranial Nerves: No cranial nerve deficit.     Motor: No abnormal muscle tone.     Coordination: Coordination normal.     Deep Tendon Reflexes: Reflexes normal.  Psychiatric:        Behavior: Behavior normal.        Thought Content: Thought content normal.        Judgment: Judgment normal.   In a w/c Using a walker  Lab Results  Component Value Date   WBC 5.1 06/12/2022   HGB 9.8 (L) 06/12/2022   HCT 30.3 (L) 06/12/2022   PLT 150 06/12/2022   GLUCOSE 108 (H) 06/12/2022   CHOL 140 10/21/2021   TRIG 157.0 (H) 10/21/2021   HDL 37.00 (L) 10/21/2021   LDLDIRECT 78.9 10/22/2008   LDLCALC 72 10/21/2021   ALT 15 06/11/2022   AST 19 06/11/2022   NA 141 06/12/2022   K 3.6 06/12/2022   CL 113 (H) 06/12/2022   CREATININE 1.64 (H) 06/12/2022   BUN 44 (H) 06/12/2022   CO2 22 06/12/2022   TSH 1.460 04/27/2022   INR 1.6 (H) 05/18/2022   HGBA1C 7.8 (H) 04/22/2022   MICROALBUR <0.7 12/12/2020    No results found.  Assessment & Plan:   Problem List Items Addressed This Visit       Cardiovascular and Mediastinum   Essential hypertension    BP Readings from Last 3 Encounters:  06/18/22 120/82  06/12/22 (!) 146/68  06/10/22 125/67  Continue with Coreg and Wilder Glade      Relevant Medications  rivaroxaban (XARELTO) 20 MG TABS tablet     Endocrine   Uncontrolled type 2 diabetes  mellitus with hyperglycemia, without long-term current use of insulin (HCC)    Cont on Farxiga and Glimepiride  Check A1c      Relevant Orders   Ambulatory referral to Nephrology   Ambulatory referral to Podiatry   Diabetic nephropathy ([place])    Bilateral foot pain.  Improved glucose control. Podiatry referral        Genitourinary   Stage 3b chronic kidney disease (CKD) (HCC)    Monitor GFR.  Hydrate well.  Nephrology referral      Relevant Orders   Ambulatory referral to Nephrology     Other   Current long-term use of anticoagulant medication with history of deep venous thrombosis 04/2022 (DVT)    Continue on Xarelto      Other Visit Diagnoses     Foot pain, bilateral    -  Primary   Relevant Orders   Ambulatory referral to Podiatry         Meds ordered this encounter  Medications   rivaroxaban (XARELTO) 20 MG TABS tablet    Sig: Take 1 tablet (20 mg total) by mouth daily with supper.    Dispense:  90 tablet    Refill:  1      Follow-up: Return in about 2 months (around 08/17/2022).  Walker Kehr, MD

## 2022-06-18 NOTE — Assessment & Plan Note (Addendum)
BP Readings from Last 3 Encounters:  06/18/22 120/82  06/12/22 (!) 146/68  06/10/22 125/67   Continue with Coreg and Wilder Glade

## 2022-06-19 DIAGNOSIS — S72142A Displaced intertrochanteric fracture of left femur, initial encounter for closed fracture: Secondary | ICD-10-CM | POA: Diagnosis not present

## 2022-06-22 NOTE — Assessment & Plan Note (Signed)
Bilateral foot pain.  Improved glucose control. Podiatry referral

## 2022-06-22 NOTE — Assessment & Plan Note (Signed)
Monitor GFR.  Hydrate well.  Nephrology referral

## 2022-06-22 NOTE — Assessment & Plan Note (Signed)
Continue on Xarelto. 

## 2022-06-22 NOTE — Assessment & Plan Note (Signed)
Cont on Farxiga and Glimepiride  Check A1c

## 2022-06-25 ENCOUNTER — Telehealth: Payer: Self-pay | Admitting: Internal Medicine

## 2022-06-25 DIAGNOSIS — S42001D Fracture of unspecified part of right clavicle, subsequent encounter for fracture with routine healing: Secondary | ICD-10-CM | POA: Diagnosis not present

## 2022-06-25 DIAGNOSIS — S2249XD Multiple fractures of ribs, unspecified side, subsequent encounter for fracture with routine healing: Secondary | ICD-10-CM | POA: Diagnosis not present

## 2022-06-25 DIAGNOSIS — S79192D Other physeal fracture of lower end of left femur, subsequent encounter for fracture with routine healing: Secondary | ICD-10-CM | POA: Diagnosis not present

## 2022-06-25 DIAGNOSIS — I82412 Acute embolism and thrombosis of left femoral vein: Secondary | ICD-10-CM | POA: Diagnosis not present

## 2022-06-25 DIAGNOSIS — S72142D Displaced intertrochanteric fracture of left femur, subsequent encounter for closed fracture with routine healing: Secondary | ICD-10-CM | POA: Diagnosis not present

## 2022-06-25 DIAGNOSIS — I82422 Acute embolism and thrombosis of left iliac vein: Secondary | ICD-10-CM | POA: Diagnosis not present

## 2022-06-25 NOTE — Telephone Encounter (Signed)
Called Brandy Gonzales gave verbal ok for PT. Pt is in compliance w/ appt.Marland KitchenJohny Chess

## 2022-06-25 NOTE — Telephone Encounter (Signed)
[place] Name: [facility] Agency Name: Isaac Laud Phone #: [phone] secure  Service Requested: PT (examples: OT/PT/Skilled Nursing/Social Work/Speech Therapy/Wound Care)  Frequency of Visits: 2X for 3 weeks, then 1X for 3 weeks

## 2022-06-26 DIAGNOSIS — I82422 Acute embolism and thrombosis of left iliac vein: Secondary | ICD-10-CM | POA: Diagnosis not present

## 2022-06-26 DIAGNOSIS — S42001D Fracture of unspecified part of right clavicle, subsequent encounter for fracture with routine healing: Secondary | ICD-10-CM | POA: Diagnosis not present

## 2022-06-26 DIAGNOSIS — S2249XD Multiple fractures of ribs, unspecified side, subsequent encounter for fracture with routine healing: Secondary | ICD-10-CM | POA: Diagnosis not present

## 2022-06-26 DIAGNOSIS — I82412 Acute embolism and thrombosis of left femoral vein: Secondary | ICD-10-CM | POA: Diagnosis not present

## 2022-06-26 DIAGNOSIS — S72142D Displaced intertrochanteric fracture of left femur, subsequent encounter for closed fracture with routine healing: Secondary | ICD-10-CM | POA: Diagnosis not present

## 2022-06-26 DIAGNOSIS — S79192D Other physeal fracture of lower end of left femur, subsequent encounter for fracture with routine healing: Secondary | ICD-10-CM | POA: Diagnosis not present

## 2022-06-29 DIAGNOSIS — S42001D Fracture of unspecified part of right clavicle, subsequent encounter for fracture with routine healing: Secondary | ICD-10-CM | POA: Diagnosis not present

## 2022-06-29 DIAGNOSIS — S72142D Displaced intertrochanteric fracture of left femur, subsequent encounter for closed fracture with routine healing: Secondary | ICD-10-CM | POA: Diagnosis not present

## 2022-06-29 DIAGNOSIS — I82422 Acute embolism and thrombosis of left iliac vein: Secondary | ICD-10-CM | POA: Diagnosis not present

## 2022-06-29 DIAGNOSIS — S79192D Other physeal fracture of lower end of left femur, subsequent encounter for fracture with routine healing: Secondary | ICD-10-CM | POA: Diagnosis not present

## 2022-06-29 DIAGNOSIS — S2249XD Multiple fractures of ribs, unspecified side, subsequent encounter for fracture with routine healing: Secondary | ICD-10-CM | POA: Diagnosis not present

## 2022-06-29 DIAGNOSIS — I82412 Acute embolism and thrombosis of left femoral vein: Secondary | ICD-10-CM | POA: Diagnosis not present

## 2022-06-30 ENCOUNTER — Ambulatory Visit (INDEPENDENT_AMBULATORY_CARE_PROVIDER_SITE_OTHER): Payer: Medicare Other | Admitting: Podiatry

## 2022-06-30 ENCOUNTER — Encounter: Payer: Self-pay | Admitting: Podiatry

## 2022-06-30 DIAGNOSIS — E1165 Type 2 diabetes mellitus with hyperglycemia: Secondary | ICD-10-CM

## 2022-06-30 DIAGNOSIS — M79675 Pain in left toe(s): Secondary | ICD-10-CM | POA: Diagnosis not present

## 2022-06-30 DIAGNOSIS — M79674 Pain in right toe(s): Secondary | ICD-10-CM | POA: Diagnosis not present

## 2022-06-30 DIAGNOSIS — B351 Tinea unguium: Secondary | ICD-10-CM
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
## 2022-07-01 DIAGNOSIS — K219 Gastro-esophageal reflux disease without esophagitis: Secondary | ICD-10-CM

## 2022-07-01 DIAGNOSIS — I509 Heart failure, unspecified: Secondary | ICD-10-CM

## 2022-07-01 DIAGNOSIS — D500 Iron deficiency anemia secondary to blood loss (chronic): Secondary | ICD-10-CM

## 2022-07-01 DIAGNOSIS — Z7901 Long term (current) use of anticoagulants: Secondary | ICD-10-CM

## 2022-07-01 DIAGNOSIS — S72142D Displaced intertrochanteric fracture of left femur, subsequent encounter for closed fracture with routine healing: Secondary | ICD-10-CM | POA: Diagnosis not present

## 2022-07-01 DIAGNOSIS — E559 Vitamin D deficiency, unspecified: Secondary | ICD-10-CM

## 2022-07-01 DIAGNOSIS — E1140 Type 2 diabetes mellitus with diabetic neuropathy, unspecified: Secondary | ICD-10-CM

## 2022-07-01 DIAGNOSIS — S79192D Other physeal fracture of lower end of left femur, subsequent encounter for fracture with routine healing: Secondary | ICD-10-CM | POA: Diagnosis not present

## 2022-07-01 DIAGNOSIS — S42001D Fracture of unspecified part of right clavicle, subsequent encounter for fracture with routine healing: Secondary | ICD-10-CM | POA: Diagnosis not present

## 2022-07-01 DIAGNOSIS — G510 Bell's palsy: Secondary | ICD-10-CM

## 2022-07-01 DIAGNOSIS — I82432 Acute embolism and thrombosis of left popliteal vein: Secondary | ICD-10-CM

## 2022-07-01 DIAGNOSIS — N1832 Chronic kidney disease, stage 3b: Secondary | ICD-10-CM

## 2022-07-01 DIAGNOSIS — Z6826 Body mass index (BMI) 26.0-26.9, adult: Secondary | ICD-10-CM

## 2022-07-01 DIAGNOSIS — Z9181 History of falling: Secondary | ICD-10-CM

## 2022-07-01 DIAGNOSIS — E1165 Type 2 diabetes mellitus with hyperglycemia: Secondary | ICD-10-CM

## 2022-07-01 DIAGNOSIS — E43 Unspecified severe protein-calorie malnutrition: Secondary | ICD-10-CM

## 2022-07-01 DIAGNOSIS — I130 Hypertensive heart and chronic kidney disease with heart failure and stage 1 through stage 4 chronic kidney disease, or unspecified chronic kidney disease: Secondary | ICD-10-CM

## 2022-07-01 DIAGNOSIS — E1122 Type 2 diabetes mellitus with diabetic chronic kidney disease: Secondary | ICD-10-CM

## 2022-07-01 DIAGNOSIS — E538 Deficiency of other specified B group vitamins: Secondary | ICD-10-CM

## 2022-07-01 DIAGNOSIS — S2249XD Multiple fractures of ribs, unspecified side, subsequent encounter for fracture with routine healing: Secondary | ICD-10-CM | POA: Diagnosis not present

## 2022-07-01 DIAGNOSIS — I82412 Acute embolism and thrombosis of left femoral vein: Secondary | ICD-10-CM

## 2022-07-01 DIAGNOSIS — I82422 Acute embolism and thrombosis of left iliac vein: Secondary | ICD-10-CM

## 2022-07-02 ENCOUNTER — Ambulatory Visit (HOSPITAL_COMMUNITY)
Admission: RE | Admit: 2022-07-02 | Discharge: 2022-07-02 | Disposition: A | Discharge status: Home / Self Care | Payer: Medicare Other | Source: Ambulatory Visit | Attending: Vascular Surgery | Admitting: Vascular Surgery

## 2022-07-02 ENCOUNTER — Encounter: Payer: Self-pay | Admitting: Vascular Surgery

## 2022-07-02 ENCOUNTER — Ambulatory Visit (INDEPENDENT_AMBULATORY_CARE_PROVIDER_SITE_OTHER): Payer: Medicare Other | Admitting: Vascular Surgery

## 2022-07-02 VITALS — BP 166/83 | HR 65 | Temp 97.7°F | Resp 16 | Ht 59.0 in | Wt 115.0 lb

## 2022-07-02 DIAGNOSIS — I82412 Acute embolism and thrombosis of left femoral vein: Secondary | ICD-10-CM | POA: Diagnosis not present

## 2022-07-02 DIAGNOSIS — I82422 Acute embolism and thrombosis of left iliac vein: Secondary | ICD-10-CM | POA: Insufficient documentation
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
## 2022-07-07 DIAGNOSIS — I509 Heart failure, unspecified: Secondary | ICD-10-CM | POA: Diagnosis not present

## 2022-07-07 DIAGNOSIS — K219 Gastro-esophageal reflux disease without esophagitis: Secondary | ICD-10-CM | POA: Diagnosis not present

## 2022-07-07 DIAGNOSIS — E1140 Type 2 diabetes mellitus with diabetic neuropathy, unspecified: Secondary | ICD-10-CM | POA: Diagnosis not present

## 2022-07-07 DIAGNOSIS — E538 Deficiency of other specified B group vitamins: Secondary | ICD-10-CM | POA: Diagnosis not present

## 2022-07-07 DIAGNOSIS — Z9181 History of falling: Secondary | ICD-10-CM | POA: Diagnosis not present

## 2022-07-07 DIAGNOSIS — Z6826 Body mass index (BMI) 26.0-26.9, adult: Secondary | ICD-10-CM | POA: Diagnosis not present

## 2022-07-07 DIAGNOSIS — Z7901 Long term (current) use of anticoagulants: Secondary | ICD-10-CM | POA: Diagnosis not present

## 2022-07-07 DIAGNOSIS — D500 Iron deficiency anemia secondary to blood loss (chronic): Secondary | ICD-10-CM | POA: Diagnosis not present

## 2022-07-07 DIAGNOSIS — E43 Unspecified severe protein-calorie malnutrition: Secondary | ICD-10-CM | POA: Diagnosis not present

## 2022-07-07 DIAGNOSIS — G510 Bell's palsy: Secondary | ICD-10-CM | POA: Diagnosis not present

## 2022-07-07 DIAGNOSIS — S72142D Displaced intertrochanteric fracture of left femur, subsequent encounter for closed fracture with routine healing: Secondary | ICD-10-CM | POA: Diagnosis not present

## 2022-07-07 DIAGNOSIS — E1122 Type 2 diabetes mellitus with diabetic chronic kidney disease: Secondary | ICD-10-CM | POA: Diagnosis not present

## 2022-07-07 DIAGNOSIS — I130 Hypertensive heart and chronic kidney disease with heart failure and stage 1 through stage 4 chronic kidney disease, or unspecified chronic kidney disease: Secondary | ICD-10-CM | POA: Diagnosis not present

## 2022-07-07 DIAGNOSIS — E559 Vitamin D deficiency, unspecified: Secondary | ICD-10-CM | POA: Diagnosis not present

## 2022-07-07 DIAGNOSIS — I82422 Acute embolism and thrombosis of left iliac vein: Secondary | ICD-10-CM | POA: Diagnosis not present

## 2022-07-07 DIAGNOSIS — E1165 Type 2 diabetes mellitus with hyperglycemia: Secondary | ICD-10-CM | POA: Diagnosis not present

## 2022-07-07 DIAGNOSIS — S79192D Other physeal fracture of lower end of left femur, subsequent encounter for fracture with routine healing: Secondary | ICD-10-CM | POA: Diagnosis not present

## 2022-07-07 DIAGNOSIS — N1832 Chronic kidney disease, stage 3b: Secondary | ICD-10-CM | POA: Diagnosis not present

## 2022-07-07 DIAGNOSIS — S2249XD Multiple fractures of ribs, unspecified side, subsequent encounter for fracture with routine healing: Secondary | ICD-10-CM | POA: Diagnosis not present

## 2022-07-07 DIAGNOSIS — S42001D Fracture of unspecified part of right clavicle, subsequent encounter for fracture with routine healing: Secondary | ICD-10-CM | POA: Diagnosis not present

## 2022-07-07 DIAGNOSIS — I82432 Acute embolism and thrombosis of left popliteal vein: Secondary | ICD-10-CM | POA: Diagnosis not present

## 2022-07-07 DIAGNOSIS — I82412 Acute embolism and thrombosis of left femoral vein: Secondary | ICD-10-CM | POA: Diagnosis not present

## 2022-07-07 DIAGNOSIS — N179 Acute kidney failure, unspecified: Secondary | ICD-10-CM | POA: Diagnosis not present

## 2022-07-13 DIAGNOSIS — I82432 Acute embolism and thrombosis of left popliteal vein: Secondary | ICD-10-CM | POA: Diagnosis not present

## 2022-07-13 DIAGNOSIS — S72142D Displaced intertrochanteric fracture of left femur, subsequent encounter for closed fracture with routine healing: Secondary | ICD-10-CM | POA: Diagnosis not present

## 2022-07-13 DIAGNOSIS — S79192D Other physeal fracture of lower end of left femur, subsequent encounter for fracture with routine healing: Secondary | ICD-10-CM | POA: Diagnosis not present

## 2022-07-13 DIAGNOSIS — N179 Acute kidney failure, unspecified: Secondary | ICD-10-CM | POA: Diagnosis not present

## 2022-07-13 DIAGNOSIS — I82422 Acute embolism and thrombosis of left iliac vein: Secondary | ICD-10-CM | POA: Diagnosis not present

## 2022-07-13 DIAGNOSIS — I82412 Acute embolism and thrombosis of left femoral vein: Secondary | ICD-10-CM | POA: Diagnosis not present

## 2022-07-14 ENCOUNTER — Telehealth: Payer: Self-pay | Admitting: Internal Medicine

## 2022-07-14 NOTE — Telephone Encounter (Signed)
Tom from [employer] called and said patient missed her OT appointment and is not answering and phone calls or messages. Best callback for Gershon Mussel is [phone] and it is secure.

## 2022-07-15 DIAGNOSIS — I82412 Acute embolism and thrombosis of left femoral vein: Secondary | ICD-10-CM | POA: Diagnosis not present

## 2022-07-15 DIAGNOSIS — N179 Acute kidney failure, unspecified: Secondary | ICD-10-CM | POA: Diagnosis not present

## 2022-07-15 DIAGNOSIS — S79192D Other physeal fracture of lower end of left femur, subsequent encounter for fracture with routine healing: Secondary | ICD-10-CM | POA: Diagnosis not present

## 2022-07-15 DIAGNOSIS — I82432 Acute embolism and thrombosis of left popliteal vein: Secondary | ICD-10-CM | POA: Diagnosis not present

## 2022-07-15 DIAGNOSIS — S72142D Displaced intertrochanteric fracture of left femur, subsequent encounter for closed fracture with routine healing: Secondary | ICD-10-CM | POA: Diagnosis not present

## 2022-07-15 DIAGNOSIS — I82422 Acute embolism and thrombosis of left iliac vein: Secondary | ICD-10-CM | POA: Diagnosis not present

## 2022-07-15 NOTE — Telephone Encounter (Signed)
Noted. Thanks.

## 2022-07-20 ENCOUNTER — Telehealth: Payer: Self-pay | Admitting: Internal Medicine

## 2022-07-20 DIAGNOSIS — S79192D Other physeal fracture of lower end of left femur, subsequent encounter for fracture with routine healing: Secondary | ICD-10-CM | POA: Diagnosis not present

## 2022-07-20 DIAGNOSIS — I82422 Acute embolism and thrombosis of left iliac vein: Secondary | ICD-10-CM | POA: Diagnosis not present

## 2022-07-20 DIAGNOSIS — S72142D Displaced intertrochanteric fracture of left femur, subsequent encounter for closed fracture with routine healing: Secondary | ICD-10-CM | POA: Diagnosis not present

## 2022-07-20 DIAGNOSIS — N179 Acute kidney failure, unspecified: Secondary | ICD-10-CM | POA: Diagnosis not present

## 2022-07-20 DIAGNOSIS — I82412 Acute embolism and thrombosis of left femoral vein: Secondary | ICD-10-CM | POA: Diagnosis not present

## 2022-07-20 DIAGNOSIS — I82432 Acute embolism and thrombosis of left popliteal vein: Secondary | ICD-10-CM | POA: Diagnosis not present

## 2022-07-20 NOTE — Telephone Encounter (Signed)
Called Brandy Gonzales gave her verbal for social worker & continuation w/ PT. Pt is in good compliance w/ appt.Marland KitchenJohny Gonzales...Brandy Gonzales

## 2022-07-20 NOTE — Telephone Encounter (Signed)
[place]:  [place]   Phone Number:  [phone]   Needs Verbal orders for what service & frequency:  Social work evaluation  and to continue PT 1 x a week for 4 weeks

## 2022-07-21 NOTE — Telephone Encounter (Signed)
Okay. Thank you.

## 2022-07-22 DIAGNOSIS — S72142D Displaced intertrochanteric fracture of left femur, subsequent encounter for closed fracture with routine healing: Secondary | ICD-10-CM | POA: Diagnosis not present

## 2022-07-22 DIAGNOSIS — N179 Acute kidney failure, unspecified: Secondary | ICD-10-CM | POA: Diagnosis not present

## 2022-07-22 DIAGNOSIS — I82412 Acute embolism and thrombosis of left femoral vein: Secondary | ICD-10-CM | POA: Diagnosis not present

## 2022-07-22 DIAGNOSIS — S79192D Other physeal fracture of lower end of left femur, subsequent encounter for fracture with routine healing: Secondary | ICD-10-CM | POA: Diagnosis not present

## 2022-07-22 DIAGNOSIS — I82422 Acute embolism and thrombosis of left iliac vein: Secondary | ICD-10-CM | POA: Diagnosis not present

## 2022-07-22 DIAGNOSIS — I82432 Acute embolism and thrombosis of left popliteal vein: Secondary | ICD-10-CM | POA: Diagnosis not present

## 2022-07-24 DIAGNOSIS — N179 Acute kidney failure, unspecified: Secondary | ICD-10-CM | POA: Diagnosis not present

## 2022-07-24 DIAGNOSIS — I82412 Acute embolism and thrombosis of left femoral vein: Secondary | ICD-10-CM | POA: Diagnosis not present

## 2022-07-24 DIAGNOSIS — S79192D Other physeal fracture of lower end of left femur, subsequent encounter for fracture with routine healing: Secondary | ICD-10-CM | POA: Diagnosis not present

## 2022-07-24 DIAGNOSIS — I82422 Acute embolism and thrombosis of left iliac vein: Secondary | ICD-10-CM | POA: Diagnosis not present

## 2022-07-24 DIAGNOSIS — S72142D Displaced intertrochanteric fracture of left femur, subsequent encounter for closed fracture with routine healing: Secondary | ICD-10-CM | POA: Diagnosis not present

## 2022-07-24 DIAGNOSIS — I82432 Acute embolism and thrombosis of left popliteal vein: Secondary | ICD-10-CM | POA: Diagnosis not present

## 2022-07-27 DIAGNOSIS — I82432 Acute embolism and thrombosis of left popliteal vein: Secondary | ICD-10-CM | POA: Diagnosis not present

## 2022-07-27 DIAGNOSIS — I82412 Acute embolism and thrombosis of left femoral vein: Secondary | ICD-10-CM | POA: Diagnosis not present

## 2022-07-27 DIAGNOSIS — S79192D Other physeal fracture of lower end of left femur, subsequent encounter for fracture with routine healing: Secondary | ICD-10-CM | POA: Diagnosis not present

## 2022-07-27 DIAGNOSIS — I82422 Acute embolism and thrombosis of left iliac vein: Secondary | ICD-10-CM | POA: Diagnosis not present

## 2022-07-27 DIAGNOSIS — S72142D Displaced intertrochanteric fracture of left femur, subsequent encounter for closed fracture with routine healing: Secondary | ICD-10-CM | POA: Diagnosis not present

## 2022-07-27 DIAGNOSIS — N179 Acute kidney failure, unspecified: Secondary | ICD-10-CM | POA: Diagnosis not present

## 2022-07-28 DIAGNOSIS — I82412 Acute embolism and thrombosis of left femoral vein: Secondary | ICD-10-CM | POA: Diagnosis not present

## 2022-07-28 DIAGNOSIS — I82432 Acute embolism and thrombosis of left popliteal vein: Secondary | ICD-10-CM | POA: Diagnosis not present

## 2022-07-28 DIAGNOSIS — S79192D Other physeal fracture of lower end of left femur, subsequent encounter for fracture with routine healing: Secondary | ICD-10-CM | POA: Diagnosis not present

## 2022-07-28 DIAGNOSIS — S72142D Displaced intertrochanteric fracture of left femur, subsequent encounter for closed fracture with routine healing: Secondary | ICD-10-CM | POA: Diagnosis not present

## 2022-07-28 DIAGNOSIS — N179 Acute kidney failure, unspecified: Secondary | ICD-10-CM | POA: Diagnosis not present

## 2022-07-28 DIAGNOSIS — I82422 Acute embolism and thrombosis of left iliac vein: Secondary | ICD-10-CM | POA: Diagnosis not present

## 2022-08-03 DIAGNOSIS — N179 Acute kidney failure, unspecified: Secondary | ICD-10-CM | POA: Diagnosis not present

## 2022-08-03 DIAGNOSIS — S72142D Displaced intertrochanteric fracture of left femur, subsequent encounter for closed fracture with routine healing: Secondary | ICD-10-CM | POA: Diagnosis not present

## 2022-08-03 DIAGNOSIS — S79192D Other physeal fracture of lower end of left femur, subsequent encounter for fracture with routine healing: Secondary | ICD-10-CM | POA: Diagnosis not present

## 2022-08-03 DIAGNOSIS — I82422 Acute embolism and thrombosis of left iliac vein: Secondary | ICD-10-CM | POA: Diagnosis not present

## 2022-08-03 DIAGNOSIS — I82412 Acute embolism and thrombosis of left femoral vein: Secondary | ICD-10-CM | POA: Diagnosis not present

## 2022-08-03 DIAGNOSIS — I82432 Acute embolism and thrombosis of left popliteal vein: Secondary | ICD-10-CM | POA: Diagnosis not present

## 2022-08-04 ENCOUNTER — Telehealth: Payer: Self-pay | Admitting: Internal Medicine

## 2022-08-04 DIAGNOSIS — I82422 Acute embolism and thrombosis of left iliac vein: Secondary | ICD-10-CM | POA: Diagnosis not present

## 2022-08-04 DIAGNOSIS — N179 Acute kidney failure, unspecified: Secondary | ICD-10-CM | POA: Diagnosis not present

## 2022-08-04 DIAGNOSIS — I82432 Acute embolism and thrombosis of left popliteal vein: Secondary | ICD-10-CM | POA: Diagnosis not present

## 2022-08-04 DIAGNOSIS — S72142D Displaced intertrochanteric fracture of left femur, subsequent encounter for closed fracture with routine healing: Secondary | ICD-10-CM | POA: Diagnosis not present

## 2022-08-04 DIAGNOSIS — S79192D Other physeal fracture of lower end of left femur, subsequent encounter for fracture with routine healing: Secondary | ICD-10-CM | POA: Diagnosis not present

## 2022-08-04 DIAGNOSIS — I82412 Acute embolism and thrombosis of left femoral vein: Secondary | ICD-10-CM | POA: Diagnosis not present

## 2022-08-04 NOTE — Telephone Encounter (Signed)
[place] Name: [facility] Agency Name: [facility] Phone #: [phone])  Service Requested: PT (examples: OT/PT/Skilled Nursing/Social Work/Speech Therapy/Wound Care)  Frequency of Visits: 1X a week for 9 weeks   Mickel Baas also requested a skilled nurse evaluation for patient.  She also wanted to inform provider that patient's left leg is edematous. There is no heat, but sore to the touch.

## 2022-08-05 NOTE — Telephone Encounter (Signed)
Called Laura no answer LMOM w/MD response../lmb 

## 2022-08-05 NOTE — Telephone Encounter (Signed)
Okay.  Thanks.

## 2022-08-06 DIAGNOSIS — S72142D Displaced intertrochanteric fracture of left femur, subsequent encounter for closed fracture with routine healing: Secondary | ICD-10-CM | POA: Diagnosis not present

## 2022-08-06 DIAGNOSIS — N1832 Chronic kidney disease, stage 3b: Secondary | ICD-10-CM | POA: Diagnosis not present

## 2022-08-06 DIAGNOSIS — Z9181 History of falling: Secondary | ICD-10-CM | POA: Diagnosis not present

## 2022-08-06 DIAGNOSIS — S42001D Fracture of unspecified part of right clavicle, subsequent encounter for fracture with routine healing: Secondary | ICD-10-CM | POA: Diagnosis not present

## 2022-08-06 DIAGNOSIS — E1165 Type 2 diabetes mellitus with hyperglycemia: Secondary | ICD-10-CM | POA: Diagnosis not present

## 2022-08-06 DIAGNOSIS — E559 Vitamin D deficiency, unspecified: Secondary | ICD-10-CM | POA: Diagnosis not present

## 2022-08-06 DIAGNOSIS — S79192D Other physeal fracture of lower end of left femur, subsequent encounter for fracture with routine healing: Secondary | ICD-10-CM | POA: Diagnosis not present

## 2022-08-06 DIAGNOSIS — I130 Hypertensive heart and chronic kidney disease with heart failure and stage 1 through stage 4 chronic kidney disease, or unspecified chronic kidney disease: Secondary | ICD-10-CM | POA: Diagnosis not present

## 2022-08-06 DIAGNOSIS — Z6826 Body mass index (BMI) 26.0-26.9, adult: Secondary | ICD-10-CM | POA: Diagnosis not present

## 2022-08-06 DIAGNOSIS — D500 Iron deficiency anemia secondary to blood loss (chronic): Secondary | ICD-10-CM | POA: Diagnosis not present

## 2022-08-06 DIAGNOSIS — G510 Bell's palsy: Secondary | ICD-10-CM | POA: Diagnosis not present

## 2022-08-06 DIAGNOSIS — E43 Unspecified severe protein-calorie malnutrition: Secondary | ICD-10-CM | POA: Diagnosis not present

## 2022-08-06 DIAGNOSIS — I82432 Acute embolism and thrombosis of left popliteal vein: Secondary | ICD-10-CM | POA: Diagnosis not present

## 2022-08-06 DIAGNOSIS — I82412 Acute embolism and thrombosis of left femoral vein: Secondary | ICD-10-CM | POA: Diagnosis not present

## 2022-08-06 DIAGNOSIS — K219 Gastro-esophageal reflux disease without esophagitis: Secondary | ICD-10-CM | POA: Diagnosis not present

## 2022-08-06 DIAGNOSIS — I82422 Acute embolism and thrombosis of left iliac vein: Secondary | ICD-10-CM | POA: Diagnosis not present

## 2022-08-06 DIAGNOSIS — E1122 Type 2 diabetes mellitus with diabetic chronic kidney disease: Secondary | ICD-10-CM | POA: Diagnosis not present

## 2022-08-06 DIAGNOSIS — S2249XD Multiple fractures of ribs, unspecified side, subsequent encounter for fracture with routine healing: Secondary | ICD-10-CM | POA: Diagnosis not present

## 2022-08-06 DIAGNOSIS — E1140 Type 2 diabetes mellitus with diabetic neuropathy, unspecified: Secondary | ICD-10-CM | POA: Diagnosis not present

## 2022-08-06 DIAGNOSIS — N179 Acute kidney failure, unspecified: Secondary | ICD-10-CM | POA: Diagnosis not present

## 2022-08-06 DIAGNOSIS — E538 Deficiency of other specified B group vitamins: Secondary | ICD-10-CM | POA: Diagnosis not present

## 2022-08-06 DIAGNOSIS — Z7901 Long term (current) use of anticoagulants: Secondary | ICD-10-CM | POA: Diagnosis not present

## 2022-08-06 DIAGNOSIS — I509 Heart failure, unspecified: Secondary | ICD-10-CM | POA: Diagnosis not present

## 2022-08-07 ENCOUNTER — Emergency Department (HOSPITAL_COMMUNITY)
Admission: EM | Admit: 2022-08-07 | Discharge: 2022-08-07 | Disposition: A | Discharge status: Home / Self Care | Payer: Medicare Other | Attending: Emergency Medicine | Admitting: Emergency Medicine

## 2022-08-07 ENCOUNTER — Emergency Department (HOSPITAL_BASED_OUTPATIENT_CLINIC_OR_DEPARTMENT_OTHER): Payer: Medicare Other

## 2022-08-07 ENCOUNTER — Other Ambulatory Visit: Payer: Self-pay

## 2022-08-07 ENCOUNTER — Emergency Department (HOSPITAL_COMMUNITY): Payer: Medicare Other

## 2022-08-07 ENCOUNTER — Encounter (HOSPITAL_COMMUNITY): Payer: Self-pay

## 2022-08-07 DIAGNOSIS — M7989 Other specified soft tissue disorders: Secondary | ICD-10-CM

## 2022-08-07 DIAGNOSIS — R944 Abnormal results of kidney function studies: Secondary | ICD-10-CM | POA: Insufficient documentation

## 2022-08-07 DIAGNOSIS — S72142D Displaced intertrochanteric fracture of left femur, subsequent encounter for closed fracture with routine healing: Secondary | ICD-10-CM | POA: Diagnosis not present

## 2022-08-07 DIAGNOSIS — Z86718 Personal history of other venous thrombosis and embolism: Secondary | ICD-10-CM | POA: Diagnosis not present

## 2022-08-07 DIAGNOSIS — I82412 Acute embolism and thrombosis of left femoral vein: Secondary | ICD-10-CM | POA: Diagnosis not present

## 2022-08-07 DIAGNOSIS — R790 Abnormal level of blood mineral: Secondary | ICD-10-CM | POA: Diagnosis not present

## 2022-08-07 DIAGNOSIS — I129 Hypertensive chronic kidney disease with stage 1 through stage 4 chronic kidney disease, or unspecified chronic kidney disease: Secondary | ICD-10-CM | POA: Insufficient documentation

## 2022-08-07 DIAGNOSIS — M25512 Pain in left shoulder: Secondary | ICD-10-CM | POA: Diagnosis not present

## 2022-08-07 DIAGNOSIS — E1122 Type 2 diabetes mellitus with diabetic chronic kidney disease: Secondary | ICD-10-CM | POA: Insufficient documentation

## 2022-08-07 DIAGNOSIS — Z7984 Long term (current) use of oral hypoglycemic drugs: Secondary | ICD-10-CM | POA: Insufficient documentation

## 2022-08-07 DIAGNOSIS — R0602 Shortness of breath: Secondary | ICD-10-CM | POA: Diagnosis not present

## 2022-08-07 DIAGNOSIS — I82422 Acute embolism and thrombosis of left iliac vein: Secondary | ICD-10-CM | POA: Diagnosis not present

## 2022-08-07 DIAGNOSIS — S79192D Other physeal fracture of lower end of left femur, subsequent encounter for fracture with routine healing: Secondary | ICD-10-CM | POA: Diagnosis not present

## 2022-08-07 DIAGNOSIS — N179 Acute kidney failure, unspecified: Secondary | ICD-10-CM | POA: Diagnosis not present

## 2022-08-07 DIAGNOSIS — Z79899 Other long term (current) drug therapy: Secondary | ICD-10-CM | POA: Insufficient documentation

## 2022-08-07 DIAGNOSIS — N189 Chronic kidney disease, unspecified: Secondary | ICD-10-CM | POA: Diagnosis not present

## 2022-08-07 DIAGNOSIS — R600 Localized edema: Secondary | ICD-10-CM | POA: Diagnosis not present

## 2022-08-07 DIAGNOSIS — Z7901 Long term (current) use of anticoagulants: Secondary | ICD-10-CM | POA: Insufficient documentation

## 2022-08-07 DIAGNOSIS — R2240 Localized swelling, mass and lump, unspecified lower limb: Secondary | ICD-10-CM | POA: Diagnosis not present

## 2022-08-07 DIAGNOSIS — I82432 Acute embolism and thrombosis of left popliteal vein: Secondary | ICD-10-CM | POA: Diagnosis not present

## 2022-08-07 LAB — COMPREHENSIVE METABOLIC PANEL
ALT: 43 U/L (ref 0–44)
AST: 38 U/L (ref 15–41)
Albumin: 3 g/dL — ABNORMAL LOW (ref 3.5–5.0)
Alkaline Phosphatase: 153 U/L — ABNORMAL HIGH (ref 38–126)
Anion gap: 10 (ref 5–15)
BUN: 13 mg/dL (ref 8–23)
CO2: 25 mmol/L (ref 22–32)
Calcium: 9.5 mg/dL (ref 8.9–10.3)
Chloride: 107 mmol/L (ref 98–111)
Creatinine, Ser: 1.42 mg/dL — ABNORMAL HIGH (ref 0.44–1.00)
GFR, Estimated: 37 mL/min — ABNORMAL LOW (ref >60)
Glucose, Bld: 90 mg/dL (ref 70–99)
Potassium: 3.9 mmol/L (ref 3.5–5.1)
Sodium: 142 mmol/L (ref 135–145)
Total Bilirubin: 0.7 mg/dL (ref 0.3–1.2)
Total Protein: 6.4 g/dL — ABNORMAL LOW (ref 6.5–8.1)

## 2022-08-07 LAB — CBC WITH DIFFERENTIAL/PLATELET
Abs Immature Granulocytes: 0.02 10*3/uL (ref 0.00–0.07)
Basophils Absolute: 0 10*3/uL (ref 0.0–0.1)
Basophils Relative: 1 %
Eosinophils Absolute: 0.1 10*3/uL (ref 0.0–0.5)
Eosinophils Relative: 2 %
HCT: 34.3 % — ABNORMAL LOW (ref 36.0–46.0)
Hemoglobin: 11 g/dL — ABNORMAL LOW (ref 12.0–15.0)
Immature Granulocytes: 0 %
Lymphocytes Relative: 44 %
Lymphs Abs: 2.3 10*3/uL (ref 0.7–4.0)
MCH: 29.6 pg (ref 26.0–34.0)
MCHC: 32.1 g/dL (ref 30.0–36.0)
MCV: 92.2 fL (ref 80.0–100.0)
Monocytes Absolute: 0.4 10*3/uL (ref 0.1–1.0)
Monocytes Relative: 8 %
Neutro Abs: 2.4 10*3/uL (ref 1.7–7.7)
Neutrophils Relative %: 45 %
Platelets: 198 10*3/uL (ref 150–400)
RBC: 3.72 MIL/uL — ABNORMAL LOW (ref 3.87–5.11)
RDW: 14.6 % (ref 11.5–15.5)
WBC: 5.3 10*3/uL (ref 4.0–10.5)
nRBC: 0 % (ref 0.0–0.2)

## 2022-08-07 LAB — BRAIN NATRIURETIC PEPTIDE: B Natriuretic Peptide: 354.8 pg/mL — ABNORMAL HIGH (ref 0.0–100.0)

## 2022-08-07 NOTE — ED Provider Triage Note (Signed)
Emergency Medicine Provider Triage Evaluation Note  GENEIEVE ESPEJEL , a 82 y.o. female  was evaluated in triage.  Pt complains of lateral leg swelling, history of DVT and thrombectomy.  This is postsurgical.  Patient.  On Xarelto.  She denies any new shortness of breath.  Her leg is significantly more swollen.  She does have a history of renal insufficiency.  Review of Systems  Positive: Leg swelling Negative: Of breath  Physical Exam  BP (!) 180/78 (BP Location: Right Arm)   Pulse 64   Temp 98.1 F (36.7 C) (Oral)   Resp 16   SpO2 99%  Gen:   Awake, no distress   Resp:  Normal effort  MSK:   Moves extremities without difficulty  Other:  BL pitting edema L>R  Medical Decision Making  Medically screening exam initiated at 4:15 PM.  Appropriate orders placed.  KHRYSTYNE HENEGAR was informed that the remainder of the evaluation will be completed by another provider, this initial triage assessment does not replace that evaluation, and the importance of remaining in the ED until their evaluation is complete.     Margarita Mail, PA-C 08/07/22 1616

## 2022-08-07 NOTE — Progress Notes (Signed)
Lower extremity venous left study completed.  Preliminary results relayed to Harris, PA.   See CV Proc for preliminary results report.   Lennon Boutwell, RDMS, RVT  
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
## 2022-08-07 NOTE — Discharge Instructions (Signed)
Your images did not show signs of clot in your leg. You do have some swelling in your legs that is due to fluid. You should really be using your compression socks and elevating your legs when at home.   We did not give you any water pills here because of your kidney function that was being monitored so closely. You should make an appointment with your doctor to follow up and see if there is any other options for your leg swelling.   Please return if you have worse shortness of breath, chest pain, numbness in your legs, other concerns.

## 2022-08-07 NOTE — ED Provider Notes (Signed)
[facility] Provider Note   CSN: [id number] Arrival date & time: 08/07/22  1545     History  Chief Complaint  Patient presents with   Bil Leg Pain   Lt Shoulder Pain    Brandy Gonzales is a 82 y.o. female with PMH T2DM on, HTN, CKD, and HLD, recent history of DVT dxd in January after hip surgery currently on Xarelto presenting with worsening left lower extremity swelling over the past few days.  Patient does have history of DVT in that leg has been compliant with her Xarelto.  She is not complaining of any pain in the legs at this time. Per daughter, patient does not elevate legs or wear her compression socks that she should.  She was also recently taken off torsemide by her doctor due to decreasing renal function for which she also recently required hospitalization in February. Denies fever, chest pain, shortness of breath, abdominal pain, nausea or vomiting, skin changes or leg pain.  HPI     Home Medications Prior to Admission medications   Medication Sig Start Date End Date Taking? Authorizing Provider  acetaminophen (TYLENOL) 500 MG tablet Take 500 mg by mouth at bedtime.    [provider]  carvedilol (COREG) 25 MG tablet Take 1 tablet (25 mg total) by mouth 2 (two) times daily with a meal. Patient taking differently: Take 50 mg by mouth daily. 04/22/22   [name], Evie Lacks, MD  Cholecalciferol (VITAMIN D3) 50 MCG (2000 UT) capsule Take 1 capsule (2,000 Units total) by mouth daily. 08/02/19   [name], Evie Lacks, MD  cyanocobalamin 1000 MCG tablet Take 1 tablet (1,000 mcg total) by mouth daily. 05/02/22   [place] Donnamarie Poag, DO  dapagliflozin propanediol (FARXIGA) 10 MG TABS tablet Take 1 tablet (10 mg total) by mouth daily before breakfast. 04/22/22   [name], Evie Lacks, MD  folic acid (FOLVITE) 1 MG tablet Take 1 tablet (1 mg total) by mouth daily. 05/01/22   [place] Donnamarie Poag, DO  glimepiride (AMARYL) 2 MG tablet Take 1  tablet (2 mg total) by mouth daily before breakfast. 04/22/22   [name], Evie Lacks, MD  mupirocin ointment (BACTROBAN) 2 % On leg wound w/dressing change qd or bid Patient not taking: Reported on 07/02/2022 04/22/22   [name], Evie Lacks, MD  omeprazole (PRILOSEC) 20 MG capsule Take 1 capsule (20 mg total) by mouth daily. 04/22/22   [name], Evie Lacks, MD  rivaroxaban (XARELTO) 20 MG TABS tablet Take 1 tablet (20 mg total) by mouth daily with supper. 06/18/22   [name], Evie Lacks, MD  thiamine (VITAMIN B-1) 100 MG tablet Take 1 tablet (100 mg total) by mouth daily. Patient taking differently: Take 100 mg by mouth at bedtime. 05/01/22   [place] Donnamarie Poag, DO      Allergies    Amlodipine besylate, Atenolol, Oxycodone hcl, Propoxyphene n-acetaminophen, and Enalapril maleate    Review of Systems   Review of Systems  Physical Exam Updated Vital Signs BP (!) 173/75   Pulse 64   Temp 98 F (36.7 C)   Resp 17   SpO2 99%  Physical Exam Vitals and nursing note reviewed.  Constitutional:      General: She is not in acute distress.    Appearance: She is well-developed.  HENT:     Head: Normocephalic and atraumatic.     Nose: Nose normal.     Mouth/Throat:     Mouth: Mucous membranes are moist.  Pharynx: Oropharynx is clear.  Eyes:     Conjunctiva/sclera: Conjunctivae normal.     Pupils: Pupils are equal, round, and reactive to light.  Cardiovascular:     Rate and Rhythm: Normal rate and regular rhythm.     Heart sounds: No murmur heard. Pulmonary:     Effort: Pulmonary effort is normal. No respiratory distress.     Breath sounds: Normal breath sounds.  Abdominal:     General: Abdomen is flat. There is no distension.     Palpations: Abdomen is soft.     Tenderness: There is no abdominal tenderness.  Musculoskeletal:        General: No swelling.     Cervical back: Neck supple.     Right lower leg: Edema present.     Left lower leg: Edema present.     Comments: LLE edema  significantly greater than RLE edema   Skin:    General: Skin is warm and dry.     Capillary Refill: Capillary refill takes less than 2 seconds.     Comments: Healing wounds on lateral and anterior LLE that have been present for a long period of time.  Neurological:     Mental Status: She is alert.  Psychiatric:        Mood and Affect: Mood normal.     ED Results / Procedures / Treatments   Labs (all labs ordered are listed, but only abnormal results are displayed) Labs Reviewed  CBC WITH DIFFERENTIAL/PLATELET - Abnormal; Notable for the following components:      Result Value   RBC 3.72 (*)    Hemoglobin 11.0 (*)    HCT 34.3 (*)    All other components within normal limits  COMPREHENSIVE METABOLIC PANEL - Abnormal; Notable for the following components:   Creatinine, Ser 1.42 (*)    Total Protein 6.4 (*)    Albumin 3.0 (*)    Alkaline Phosphatase 153 (*)    GFR, Estimated 37 (*)    All other components within normal limits  BRAIN NATRIURETIC PEPTIDE - Abnormal; Notable for the following components:   B Natriuretic Peptide 354.8 (*)    All other components within normal limits    EKG EKG Interpretation  Date/Time:  Friday August 07 2022 21:06:02 EDT Ventricular Rate:  60 PR Interval:  142 QRS Duration: 74 QT Interval:  416 QTC Calculation: 416 R Axis:   -9 Text Interpretation: Normal sinus rhythm Left ventricular hypertrophy with repolarization abnormality ( R in aVL , Romhilt-Estes ) Abnormal ECG When compared with ECG of 27-Apr-2022 13:43, PREVIOUS ECG IS PRESENT Confirmed by Nanda Quinton [phone]) on 08/07/2022 9:42:15 PM  Radiology VAS [place] LOWER EXTREMITY VENOUS (DVT)  Result Date: 08/07/2022  Lower Venous DVT Study Patient Name:  Brandy Gonzales  Date of Exam:   08/07/2022 Medical Rec #: [id number]     Accession #:    [id number] Date of Birth: October 12, 1940     Patient Gender: F Patient Age:   4 years Exam Location:  [facility] Procedure:      VAS [place] LOWER EXTREMITY  VENOUS (DVT) Referring Phys: ABIGAIL HARRIS --------------------------------------------------------------------------------  Indications: Asymmetrical left leg swelling with history of DVT. Other Indications: S/P LT ORIF (femur fracture) on 12/17 - immobility. Risk Factors: Surgery 05/18/2022 left lower rextremity mechanical thrombectomy. Limitations: Poor ultrasound/tissue interface and body habitus. Comparison Study: 07-02-2022 Most recent prior left lower extremity venous study  was negative for acute DVT. Non-obstructive, chronic at the                   mid femoral. Performing Technologist: Darlin Coco RDMS, RVT  Examination Guidelines: A complete evaluation includes B-mode imaging, spectral Doppler, color Doppler, and power Doppler as needed of all accessible portions of each vessel. Bilateral testing is considered an integral part of a complete examination. Limited examinations for reoccurring indications may be performed as noted. The reflux portion of the exam is performed with the patient in reverse Trendelenburg.  +-----+---------------+---------+-----------+----------+--------------+ RIGHTCompressibilityPhasicitySpontaneityPropertiesThrombus Aging +-----+---------------+---------+-----------+----------+--------------+ CFV  Full           Yes      Yes                                 +-----+---------------+---------+-----------+----------+--------------+   +---------+---------------+---------+-----------+----------+-------------------+ LEFT     CompressibilityPhasicitySpontaneityPropertiesThrombus Aging      +---------+---------------+---------+-----------+----------+-------------------+ CFV      Full           Yes      Yes                                      +---------+---------------+---------+-----------+----------+-------------------+ SFJ      Full                                                              +---------+---------------+---------+-----------+----------+-------------------+ FV Prox  Full                                                             +---------+---------------+---------+-----------+----------+-------------------+ FV Mid   Full                                                             +---------+---------------+---------+-----------+----------+-------------------+ FV DistalFull                                                             +---------+---------------+---------+-----------+----------+-------------------+ PFV      Full                                                             +---------+---------------+---------+-----------+----------+-------------------+ POP      Full           Yes      Yes                                      +---------+---------------+---------+-----------+----------+-------------------+  PTV      Full                                         Significant                                                               overlying edema     +---------+---------------+---------+-----------+----------+-------------------+ PERO                    Yes      Yes                  Significant                                                               overlying edema     +---------+---------------+---------+-----------+----------+-------------------+     Summary: RIGHT: - No evidence of common femoral vein obstruction.  LEFT: - There is no evidence of deep vein thrombosis in the lower extremity. However, portions of this examination were limited- see technologist comments above.  - No cystic structure found in the popliteal fossa.  *See table(s) above for measurements and observations. Electronically signed by Harold Barban MD on 08/07/2022 at 8:15:03 PM.    Final    DG Chest 2 View  Result Date: 08/07/2022 CLINICAL DATA:  Shortness of breath EXAM: CHEST - 2 VIEW COMPARISON:  Chest radiograph dated 04/25/2022  FINDINGS: Low lung volumes. Bibasilar patchy opacities. No pleural effusion or pneumothorax. The heart size and mediastinal contours are within normal limits. The visualized skeletal structures are unremarkable. IMPRESSION: Low lung volumes with bibasilar patchy opacities, may reflect atelectasis, aspiration, or pneumonia. Electronically Signed   By: Darrin Nipper M.D.   On: 08/07/2022 17:11    Procedures Procedures    Medications Ordered in ED Medications - No data to display  ED Course/ Medical Decision Making/ A&P                             Medical Decision Making  Patient presented hypertensive with SBP in the 170s, saturating well on room air.  On exam she does have bilateral lower extremity edema that is significantly worse on the left.  She has no acute skin changes in addition to her chronic healed wounds on the left lower extremity.  DVT study was negative.  Chest x-ray did show bilateral consolidations that are likely atelectasis related.  Patient has been having a cough recently, no fever or chills.  Still tolerating p.o. intake and is not complaining of any chest pain at this time.  Labs are reassuring as patient has no leukocytosis.  She does have elevated creatinine, but is downtrending from previous since being off of her torsemide.  Patient has elevated BNP but no signs of pulmonary edema.  EKG was sinus rhythm with no signs of acute ischemia at this time and no arrhythmia noted.  Likely patient is having  increased swelling in her legs since she is off of her torsemide and she is noncompliant with her compression socks and elevation as her other physicians have recommended to her.  Stressed to the patient that she should be more compliant with her compression socks that should be elevating her legs while at home.  She should follow-up with her physician regarding other options to manage her leg swelling.        Final Clinical Impression(s) / ED Diagnoses Final diagnoses:   Leg swelling     Bradd Canary, MD 08/07/22 2257    Margette Fast, MD 08/08/22 1029

## 2022-08-11 DIAGNOSIS — S79192D Other physeal fracture of lower end of left femur, subsequent encounter for fracture with routine healing: Secondary | ICD-10-CM | POA: Diagnosis not present

## 2022-08-11 DIAGNOSIS — I82422 Acute embolism and thrombosis of left iliac vein: Secondary | ICD-10-CM | POA: Diagnosis not present

## 2022-08-11 DIAGNOSIS — I82412 Acute embolism and thrombosis of left femoral vein: Secondary | ICD-10-CM | POA: Diagnosis not present

## 2022-08-11 DIAGNOSIS — S72142D Displaced intertrochanteric fracture of left femur, subsequent encounter for closed fracture with routine healing: Secondary | ICD-10-CM | POA: Diagnosis not present

## 2022-08-11 DIAGNOSIS — I82432 Acute embolism and thrombosis of left popliteal vein: Secondary | ICD-10-CM | POA: Diagnosis not present

## 2022-08-11 DIAGNOSIS — N179 Acute kidney failure, unspecified: Secondary | ICD-10-CM | POA: Diagnosis not present

## 2022-08-12 ENCOUNTER — Telehealth: Payer: Self-pay

## 2022-08-12 NOTE — Telephone Encounter (Signed)
        Patient  visited [facility] on 4/3     Telephone encounter attempt :  1st  A HIPAA compliant voice message was left requesting a return call.  Instructed patient to call back     [facility] [phone], [place], [place] [address] Phone: [phone] Email: [email or web address]

## 2022-08-13 ENCOUNTER — Telehealth: Payer: Self-pay

## 2022-08-13 DIAGNOSIS — S72142D Displaced intertrochanteric fracture of left femur, subsequent encounter for closed fracture with routine healing: Secondary | ICD-10-CM | POA: Diagnosis not present

## 2022-08-13 DIAGNOSIS — N1832 Chronic kidney disease, stage 3b: Secondary | ICD-10-CM

## 2022-08-13 DIAGNOSIS — N179 Acute kidney failure, unspecified: Secondary | ICD-10-CM | POA: Diagnosis not present

## 2022-08-13 DIAGNOSIS — I509 Heart failure, unspecified: Secondary | ICD-10-CM

## 2022-08-13 DIAGNOSIS — E1122 Type 2 diabetes mellitus with diabetic chronic kidney disease: Secondary | ICD-10-CM

## 2022-08-13 DIAGNOSIS — I130 Hypertensive heart and chronic kidney disease with heart failure and stage 1 through stage 4 chronic kidney disease, or unspecified chronic kidney disease: Secondary | ICD-10-CM

## 2022-08-13 DIAGNOSIS — I82432 Acute embolism and thrombosis of left popliteal vein: Secondary | ICD-10-CM

## 2022-08-13 DIAGNOSIS — S42001D Fracture of unspecified part of right clavicle, subsequent encounter for fracture with routine healing: Secondary | ICD-10-CM

## 2022-08-13 DIAGNOSIS — I82412 Acute embolism and thrombosis of left femoral vein: Secondary | ICD-10-CM

## 2022-08-13 DIAGNOSIS — S2249XD Multiple fractures of ribs, unspecified side, subsequent encounter for fracture with routine healing: Secondary | ICD-10-CM

## 2022-08-13 DIAGNOSIS — I82422 Acute embolism and thrombosis of left iliac vein: Secondary | ICD-10-CM | POA: Diagnosis not present

## 2022-08-13 DIAGNOSIS — S79192D Other physeal fracture of lower end of left femur, subsequent encounter for fracture with routine healing: Secondary | ICD-10-CM | POA: Diagnosis not present

## 2022-08-13 NOTE — Telephone Encounter (Signed)
     Patient  visit on 3/29  at [facility]  Have you been able to follow up with your primary care physician? Yes   The patient was or was not able to obtain any needed medicine or equipment. Yes   Are there diet recommendations that you are having difficulty following? Na   Patient expresses understanding of discharge instructions and education provided has no other needs at this time.  Yes      [facility] [phone] [address], [place], [place] [address] Phone: [phone] Email: [email or web address]

## 2022-08-18 DIAGNOSIS — S79192D Other physeal fracture of lower end of left femur, subsequent encounter for fracture with routine healing: Secondary | ICD-10-CM | POA: Diagnosis not present

## 2022-08-18 DIAGNOSIS — I82432 Acute embolism and thrombosis of left popliteal vein: Secondary | ICD-10-CM | POA: Diagnosis not present

## 2022-08-18 DIAGNOSIS — I82412 Acute embolism and thrombosis of left femoral vein: Secondary | ICD-10-CM | POA: Diagnosis not present

## 2022-08-18 DIAGNOSIS — I82422 Acute embolism and thrombosis of left iliac vein: Secondary | ICD-10-CM | POA: Diagnosis not present

## 2022-08-18 DIAGNOSIS — S72142D Displaced intertrochanteric fracture of left femur, subsequent encounter for closed fracture with routine healing: Secondary | ICD-10-CM | POA: Diagnosis not present

## 2022-08-18 DIAGNOSIS — N179 Acute kidney failure, unspecified: Secondary | ICD-10-CM | POA: Diagnosis not present

## 2022-08-24 DIAGNOSIS — S79192D Other physeal fracture of lower end of left femur, subsequent encounter for fracture with routine healing: Secondary | ICD-10-CM | POA: Diagnosis not present

## 2022-08-24 DIAGNOSIS — I82412 Acute embolism and thrombosis of left femoral vein: Secondary | ICD-10-CM | POA: Diagnosis not present

## 2022-08-24 DIAGNOSIS — I82422 Acute embolism and thrombosis of left iliac vein: Secondary | ICD-10-CM | POA: Diagnosis not present

## 2022-08-24 DIAGNOSIS — N179 Acute kidney failure, unspecified: Secondary | ICD-10-CM | POA: Diagnosis not present

## 2022-08-24 DIAGNOSIS — I82432 Acute embolism and thrombosis of left popliteal vein: Secondary | ICD-10-CM | POA: Diagnosis not present

## 2022-08-24 DIAGNOSIS — S72142D Displaced intertrochanteric fracture of left femur, subsequent encounter for closed fracture with routine healing: Secondary | ICD-10-CM | POA: Diagnosis not present

## 2022-08-26 ENCOUNTER — Ambulatory Visit (INDEPENDENT_AMBULATORY_CARE_PROVIDER_SITE_OTHER): Payer: Medicare Other | Admitting: Internal Medicine

## 2022-08-26 ENCOUNTER — Encounter: Payer: Self-pay | Admitting: Internal Medicine

## 2022-08-26 VITALS — BP 120/80 | HR 66 | Temp 97.9°F | Ht 59.0 in | Wt 119.0 lb

## 2022-08-26 DIAGNOSIS — N1832 Chronic kidney disease, stage 3b: Secondary | ICD-10-CM

## 2022-08-26 DIAGNOSIS — E43 Unspecified severe protein-calorie malnutrition: Secondary | ICD-10-CM | POA: Diagnosis not present

## 2022-08-26 DIAGNOSIS — I10 Essential (primary) hypertension: Secondary | ICD-10-CM

## 2022-08-26 DIAGNOSIS — R413 Other amnesia: Secondary | ICD-10-CM | POA: Diagnosis not present

## 2022-08-26 DIAGNOSIS — E1165 Type 2 diabetes mellitus with hyperglycemia: Secondary | ICD-10-CM

## 2022-08-26 DIAGNOSIS — E1121 Type 2 diabetes mellitus with diabetic nephropathy: Secondary | ICD-10-CM | POA: Diagnosis not present

## 2022-08-26 DIAGNOSIS — Z7984 Long term (current) use of oral hypoglycemic drugs: Secondary | ICD-10-CM | POA: Diagnosis not present

## 2022-08-26 MED ORDER — QUETIAPINE FUMARATE 25 MG PO TABS: 25.0000 mg | ORAL_TABLET | Freq: Every day | ORAL | 5 refills | Status: DC

## 2022-08-26 NOTE — Assessment & Plan Note (Signed)
Start to use protein drinks daily

## 2022-08-26 NOTE — Assessment & Plan Note (Signed)
Start to use protein drinks daily 

## 2022-08-26 NOTE — Assessment & Plan Note (Signed)
Worse - probable Alzheimer's Sun downing sx's: start Seroquel at HS  Potential benefits of a long term seroquel use as well as potential risks  and complications were explained to the patient and were aknowledged.

## 2022-08-26 NOTE — Assessment & Plan Note (Signed)
On Coreg, Farxiga 
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
## 2022-08-26 NOTE — Assessment & Plan Note (Signed)
Check CMET. 

## 2022-08-26 NOTE — Assessment & Plan Note (Signed)
Hydrate well Will use Demadex prn w/caution prn

## 2022-08-31 ENCOUNTER — Other Ambulatory Visit: Payer: Self-pay | Admitting: Internal Medicine

## 2022-08-31 DIAGNOSIS — N179 Acute kidney failure, unspecified: Secondary | ICD-10-CM | POA: Diagnosis not present

## 2022-08-31 DIAGNOSIS — I82422 Acute embolism and thrombosis of left iliac vein: Secondary | ICD-10-CM | POA: Diagnosis not present

## 2022-08-31 DIAGNOSIS — I82432 Acute embolism and thrombosis of left popliteal vein: Secondary | ICD-10-CM | POA: Diagnosis not present

## 2022-08-31 DIAGNOSIS — S79192D Other physeal fracture of lower end of left femur, subsequent encounter for fracture with routine healing: Secondary | ICD-10-CM | POA: Diagnosis not present

## 2022-08-31 DIAGNOSIS — I82412 Acute embolism and thrombosis of left femoral vein: Secondary | ICD-10-CM | POA: Diagnosis not present

## 2022-08-31 DIAGNOSIS — S72142D Displaced intertrochanteric fracture of left femur, subsequent encounter for closed fracture with routine healing: Secondary | ICD-10-CM | POA: Diagnosis not present

## 2022-09-03 ENCOUNTER — Encounter: Payer: Self-pay | Admitting: Vascular Surgery

## 2022-09-03 ENCOUNTER — Ambulatory Visit (INDEPENDENT_AMBULATORY_CARE_PROVIDER_SITE_OTHER): Payer: Medicare Other | Admitting: Vascular Surgery

## 2022-09-03 VITALS — BP 137/80 | HR 67 | Temp 98.3°F | Resp 18 | Ht 59.0 in | Wt 119.0 lb

## 2022-09-03 DIAGNOSIS — I82512 Chronic embolism and thrombosis of left femoral vein: Secondary | ICD-10-CM
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
## 2022-09-05 DIAGNOSIS — S79192D Other physeal fracture of lower end of left femur, subsequent encounter for fracture with routine healing: Secondary | ICD-10-CM | POA: Diagnosis not present

## 2022-09-05 DIAGNOSIS — I82412 Acute embolism and thrombosis of left femoral vein: Secondary | ICD-10-CM | POA: Diagnosis not present

## 2022-09-05 DIAGNOSIS — K219 Gastro-esophageal reflux disease without esophagitis: Secondary | ICD-10-CM | POA: Diagnosis not present

## 2022-09-05 DIAGNOSIS — I82432 Acute embolism and thrombosis of left popliteal vein: Secondary | ICD-10-CM | POA: Diagnosis not present

## 2022-09-05 DIAGNOSIS — I82422 Acute embolism and thrombosis of left iliac vein: Secondary | ICD-10-CM | POA: Diagnosis not present

## 2022-09-05 DIAGNOSIS — Z6826 Body mass index (BMI) 26.0-26.9, adult: Secondary | ICD-10-CM | POA: Diagnosis not present

## 2022-09-05 DIAGNOSIS — D500 Iron deficiency anemia secondary to blood loss (chronic): Secondary | ICD-10-CM | POA: Diagnosis not present

## 2022-09-05 DIAGNOSIS — I509 Heart failure, unspecified: Secondary | ICD-10-CM | POA: Diagnosis not present

## 2022-09-05 DIAGNOSIS — S72142D Displaced intertrochanteric fracture of left femur, subsequent encounter for closed fracture with routine healing: Secondary | ICD-10-CM | POA: Diagnosis not present

## 2022-09-05 DIAGNOSIS — N179 Acute kidney failure, unspecified: Secondary | ICD-10-CM | POA: Diagnosis not present

## 2022-09-05 DIAGNOSIS — S2249XD Multiple fractures of ribs, unspecified side, subsequent encounter for fracture with routine healing: Secondary | ICD-10-CM | POA: Diagnosis not present

## 2022-09-05 DIAGNOSIS — E1165 Type 2 diabetes mellitus with hyperglycemia: Secondary | ICD-10-CM | POA: Diagnosis not present

## 2022-09-05 DIAGNOSIS — E538 Deficiency of other specified B group vitamins: Secondary | ICD-10-CM | POA: Diagnosis not present

## 2022-09-05 DIAGNOSIS — E1122 Type 2 diabetes mellitus with diabetic chronic kidney disease: Secondary | ICD-10-CM | POA: Diagnosis not present

## 2022-09-05 DIAGNOSIS — E1140 Type 2 diabetes mellitus with diabetic neuropathy, unspecified: Secondary | ICD-10-CM | POA: Diagnosis not present

## 2022-09-05 DIAGNOSIS — E43 Unspecified severe protein-calorie malnutrition: Secondary | ICD-10-CM | POA: Diagnosis not present

## 2022-09-05 DIAGNOSIS — Z7901 Long term (current) use of anticoagulants: Secondary | ICD-10-CM | POA: Diagnosis not present

## 2022-09-05 DIAGNOSIS — G510 Bell's palsy: Secondary | ICD-10-CM | POA: Diagnosis not present

## 2022-09-05 DIAGNOSIS — E559 Vitamin D deficiency, unspecified: Secondary | ICD-10-CM | POA: Diagnosis not present

## 2022-09-05 DIAGNOSIS — S42001D Fracture of unspecified part of right clavicle, subsequent encounter for fracture with routine healing: Secondary | ICD-10-CM | POA: Diagnosis not present

## 2022-09-05 DIAGNOSIS — Z9181 History of falling: Secondary | ICD-10-CM | POA: Diagnosis not present

## 2022-09-05 DIAGNOSIS — N1832 Chronic kidney disease, stage 3b: Secondary | ICD-10-CM | POA: Diagnosis not present

## 2022-09-05 DIAGNOSIS — I130 Hypertensive heart and chronic kidney disease with heart failure and stage 1 through stage 4 chronic kidney disease, or unspecified chronic kidney disease: Secondary | ICD-10-CM | POA: Diagnosis not present

## 2022-09-08 DIAGNOSIS — I82422 Acute embolism and thrombosis of left iliac vein: Secondary | ICD-10-CM | POA: Diagnosis not present

## 2022-09-08 DIAGNOSIS — I82412 Acute embolism and thrombosis of left femoral vein: Secondary | ICD-10-CM | POA: Diagnosis not present

## 2022-09-08 DIAGNOSIS — S79192D Other physeal fracture of lower end of left femur, subsequent encounter for fracture with routine healing: Secondary | ICD-10-CM | POA: Diagnosis not present

## 2022-09-08 DIAGNOSIS — S72142D Displaced intertrochanteric fracture of left femur, subsequent encounter for closed fracture with routine healing: Secondary | ICD-10-CM | POA: Diagnosis not present

## 2022-09-08 DIAGNOSIS — N179 Acute kidney failure, unspecified: Secondary | ICD-10-CM | POA: Diagnosis not present

## 2022-09-08 DIAGNOSIS — I82432 Acute embolism and thrombosis of left popliteal vein: Secondary | ICD-10-CM | POA: Diagnosis not present

## 2022-09-14 DIAGNOSIS — I82422 Acute embolism and thrombosis of left iliac vein: Secondary | ICD-10-CM | POA: Diagnosis not present

## 2022-09-14 DIAGNOSIS — S79192D Other physeal fracture of lower end of left femur, subsequent encounter for fracture with routine healing: Secondary | ICD-10-CM | POA: Diagnosis not present

## 2022-09-14 DIAGNOSIS — I82412 Acute embolism and thrombosis of left femoral vein: Secondary | ICD-10-CM | POA: Diagnosis not present

## 2022-09-14 DIAGNOSIS — N179 Acute kidney failure, unspecified: Secondary | ICD-10-CM | POA: Diagnosis not present

## 2022-09-14 DIAGNOSIS — S72142D Displaced intertrochanteric fracture of left femur, subsequent encounter for closed fracture with routine healing: Secondary | ICD-10-CM | POA: Diagnosis not present

## 2022-09-14 DIAGNOSIS — I82432 Acute embolism and thrombosis of left popliteal vein: Secondary | ICD-10-CM | POA: Diagnosis not present

## 2022-09-22 DIAGNOSIS — N179 Acute kidney failure, unspecified: Secondary | ICD-10-CM | POA: Diagnosis not present

## 2022-09-22 DIAGNOSIS — I82432 Acute embolism and thrombosis of left popliteal vein: Secondary | ICD-10-CM | POA: Diagnosis not present

## 2022-09-22 DIAGNOSIS — S72142D Displaced intertrochanteric fracture of left femur, subsequent encounter for closed fracture with routine healing: Secondary | ICD-10-CM | POA: Diagnosis not present

## 2022-09-22 DIAGNOSIS — I82422 Acute embolism and thrombosis of left iliac vein: Secondary | ICD-10-CM | POA: Diagnosis not present

## 2022-09-22 DIAGNOSIS — I82412 Acute embolism and thrombosis of left femoral vein: Secondary | ICD-10-CM | POA: Diagnosis not present

## 2022-09-22 DIAGNOSIS — S79192D Other physeal fracture of lower end of left femur, subsequent encounter for fracture with routine healing: Secondary | ICD-10-CM | POA: Diagnosis not present

## 2022-09-29 DIAGNOSIS — I82412 Acute embolism and thrombosis of left femoral vein: Secondary | ICD-10-CM | POA: Diagnosis not present

## 2022-09-29 DIAGNOSIS — S79192D Other physeal fracture of lower end of left femur, subsequent encounter for fracture with routine healing: Secondary | ICD-10-CM | POA: Diagnosis not present

## 2022-09-29 DIAGNOSIS — I82422 Acute embolism and thrombosis of left iliac vein: Secondary | ICD-10-CM | POA: Diagnosis not present

## 2022-09-29 DIAGNOSIS — I82432 Acute embolism and thrombosis of left popliteal vein: Secondary | ICD-10-CM | POA: Diagnosis not present

## 2022-09-29 DIAGNOSIS — S72142D Displaced intertrochanteric fracture of left femur, subsequent encounter for closed fracture with routine healing: Secondary | ICD-10-CM | POA: Diagnosis not present

## 2022-09-29 DIAGNOSIS — N179 Acute kidney failure, unspecified: Secondary | ICD-10-CM | POA: Diagnosis not present

## 2022-10-09 ENCOUNTER — Encounter: Payer: Self-pay | Admitting: Podiatry

## 2022-10-09 ENCOUNTER — Ambulatory Visit (INDEPENDENT_AMBULATORY_CARE_PROVIDER_SITE_OTHER): Payer: Medicare Other | Admitting: Podiatry

## 2022-10-09 ENCOUNTER — Ambulatory Visit (INDEPENDENT_AMBULATORY_CARE_PROVIDER_SITE_OTHER): Payer: Medicare Other

## 2022-10-09 DIAGNOSIS — M79675 Pain in left toe(s): Secondary | ICD-10-CM | POA: Diagnosis not present

## 2022-10-09 DIAGNOSIS — M79674 Pain in right toe(s): Secondary | ICD-10-CM

## 2022-10-09 DIAGNOSIS — T148XXA Other injury of unspecified body region, initial encounter: Secondary | ICD-10-CM | POA: Diagnosis not present

## 2022-10-09 DIAGNOSIS — B351 Tinea unguium: Secondary | ICD-10-CM | POA: Diagnosis not present

## 2022-10-09 DIAGNOSIS — S9032XA Contusion of left foot, initial encounter: Secondary | ICD-10-CM

## 2022-10-09 MED ORDER — DOXYCYCLINE HYCLATE 100 MG PO TABS: 100.0000 mg | ORAL_TABLET | Freq: Two times a day (BID) | ORAL | 0 refills | Status: DC

## 2022-10-09 NOTE — Progress Notes (Addendum)
This patient returns to my office for at risk foot care.  This patient requires this care by a professional since this patient will be at risk due to having type 2 diabetes and CKD. This patient is unable to cut nails herself since the patient cannot reach her nails.These nails are painful walking and wearing shoes.  This patient presents for at risk foot care today.  General Appearance  Alert, conversant and in no acute stress.  Vascular  Dorsalis pedis and posterior tibial  pulses are  weakly palpable  bilaterally.  Capillary return is within normal limits  bilaterally.Cold feet  bilaterally.  Neurologic  Senn-Weinstein monofilament wire test within normal limits  bilaterally. Muscle power within normal limits bilaterally.  Nails Thick disfigured discolored nails with subungual debris  from hallux to fifth toes bilaterally. No evidence of bacterial infection or drainage bilaterally.  Orthopedic  No limitations of motion  feet .  No crepitus or effusions noted.  No bony pathology or digital deformities noted. Thickened dorsum of left foot.  Painful to palpation.  No increased temperature.  Skin  normotropic skin with no porokeratosis noted bilaterally.  No signs of infections or ulcers noted.     Onychomycosis  Pain in right toes  Pain in left toes  Consent was obtained for treatment procedures.   Mechanical debridement of nails 1-5  bilaterally performed with a nail nipper.  Filed with dremel without incident. During my nail care, I noted she has swollen dorsum of left foot.  I took an x-ray to r/o bony pathology.  Dr.  Allena Katz rendered a second opinion and found the thickness to be hematoma due to taking xarelto. He told me to prescribe doxycycline # 28 for 2 weeks.  She is return for follow up in 2 weeks with him.   Return office visit   3 months                   Told patient to return for periodic foot care and evaluation due to potential at risk complications.   Helane Gunther DPM

## 2022-10-14 ENCOUNTER — Emergency Department (HOSPITAL_COMMUNITY): Payer: Medicare Other

## 2022-10-14 ENCOUNTER — Encounter (HOSPITAL_COMMUNITY): Payer: Self-pay

## 2022-10-14 ENCOUNTER — Other Ambulatory Visit: Payer: Self-pay

## 2022-10-14 ENCOUNTER — Inpatient Hospital Stay (HOSPITAL_COMMUNITY)
Admission: EM | Admit: 2022-10-14 | Discharge: 2022-10-17 | DRG: 683 | Disposition: A | Discharge status: Home Health | Payer: Medicare Other | Attending: Family Medicine | Admitting: Family Medicine

## 2022-10-14 DIAGNOSIS — Z7901 Long term (current) use of anticoagulants: Secondary | ICD-10-CM | POA: Diagnosis not present

## 2022-10-14 DIAGNOSIS — Z803 Family history of malignant neoplasm of breast: Secondary | ICD-10-CM

## 2022-10-14 DIAGNOSIS — Z79899 Other long term (current) drug therapy: Secondary | ICD-10-CM

## 2022-10-14 DIAGNOSIS — Z7982 Long term (current) use of aspirin: Secondary | ICD-10-CM

## 2022-10-14 DIAGNOSIS — E1121 Type 2 diabetes mellitus with diabetic nephropathy: Secondary | ICD-10-CM | POA: Diagnosis not present

## 2022-10-14 DIAGNOSIS — Z7984 Long term (current) use of oral hypoglycemic drugs: Secondary | ICD-10-CM | POA: Diagnosis not present

## 2022-10-14 DIAGNOSIS — Z1152 Encounter for screening for COVID-19: Secondary | ICD-10-CM | POA: Diagnosis not present

## 2022-10-14 DIAGNOSIS — I129 Hypertensive chronic kidney disease with stage 1 through stage 4 chronic kidney disease, or unspecified chronic kidney disease: Secondary | ICD-10-CM | POA: Diagnosis present

## 2022-10-14 DIAGNOSIS — N3001 Acute cystitis with hematuria: Secondary | ICD-10-CM | POA: Diagnosis not present

## 2022-10-14 DIAGNOSIS — Z8669 Personal history of other diseases of the nervous system and sense organs: Secondary | ICD-10-CM | POA: Diagnosis not present

## 2022-10-14 DIAGNOSIS — N179 Acute kidney failure, unspecified: Principal | ICD-10-CM | POA: Diagnosis present

## 2022-10-14 DIAGNOSIS — I959 Hypotension, unspecified: Secondary | ICD-10-CM | POA: Diagnosis not present

## 2022-10-14 DIAGNOSIS — Z90710 Acquired absence of both cervix and uterus: Secondary | ICD-10-CM | POA: Diagnosis not present

## 2022-10-14 DIAGNOSIS — K219 Gastro-esophageal reflux disease without esophagitis: Secondary | ICD-10-CM | POA: Diagnosis present

## 2022-10-14 DIAGNOSIS — Z86718 Personal history of other venous thrombosis and embolism: Secondary | ICD-10-CM

## 2022-10-14 DIAGNOSIS — R55 Syncope and collapse: Secondary | ICD-10-CM | POA: Diagnosis present

## 2022-10-14 DIAGNOSIS — M7989 Other specified soft tissue disorders: Secondary | ICD-10-CM | POA: Diagnosis present

## 2022-10-14 DIAGNOSIS — Z833 Family history of diabetes mellitus: Secondary | ICD-10-CM

## 2022-10-14 DIAGNOSIS — B9620 Unspecified Escherichia coli [E. coli] as the cause of diseases classified elsewhere: Secondary | ICD-10-CM | POA: Diagnosis present

## 2022-10-14 DIAGNOSIS — N1832 Chronic kidney disease, stage 3b: Secondary | ICD-10-CM | POA: Diagnosis present

## 2022-10-14 DIAGNOSIS — E1122 Type 2 diabetes mellitus with diabetic chronic kidney disease: Secondary | ICD-10-CM | POA: Diagnosis present

## 2022-10-14 DIAGNOSIS — Z95828 Presence of other vascular implants and grafts: Secondary | ICD-10-CM

## 2022-10-14 DIAGNOSIS — Z800 Family history of malignant neoplasm of digestive organs: Secondary | ICD-10-CM

## 2022-10-14 DIAGNOSIS — L0889 Other specified local infections of the skin and subcutaneous tissue: Secondary | ICD-10-CM | POA: Diagnosis present

## 2022-10-14 DIAGNOSIS — S9032XA Contusion of left foot, initial encounter: Secondary | ICD-10-CM | POA: Diagnosis not present

## 2022-10-14 DIAGNOSIS — Z841 Family history of disorders of kidney and ureter: Secondary | ICD-10-CM

## 2022-10-14 DIAGNOSIS — I10 Essential (primary) hypertension: Secondary | ICD-10-CM | POA: Diagnosis not present

## 2022-10-14 DIAGNOSIS — E1165 Type 2 diabetes mellitus with hyperglycemia: Secondary | ICD-10-CM | POA: Diagnosis present

## 2022-10-14 DIAGNOSIS — Z8049 Family history of malignant neoplasm of other genital organs: Secondary | ICD-10-CM | POA: Diagnosis not present

## 2022-10-14 DIAGNOSIS — Z8249 Family history of ischemic heart disease and other diseases of the circulatory system: Secondary | ICD-10-CM | POA: Diagnosis not present

## 2022-10-14 DIAGNOSIS — N390 Urinary tract infection, site not specified: Secondary | ICD-10-CM | POA: Diagnosis present

## 2022-10-14 DIAGNOSIS — N3000 Acute cystitis without hematuria: Secondary | ICD-10-CM | POA: Diagnosis not present

## 2022-10-14 LAB — CBC
HCT: 43.9 % (ref 36.0–46.0)
Hemoglobin: 13.9 g/dL (ref 12.0–15.0)
MCH: 28.5 pg (ref 26.0–34.0)
MCHC: 31.7 g/dL (ref 30.0–36.0)
MCV: 90.1 fL (ref 80.0–100.0)
Platelets: 220 10*3/uL (ref 150–400)
RBC: 4.87 MIL/uL (ref 3.87–5.11)
RDW: 15 % (ref 11.5–15.5)
WBC: 13.3 10*3/uL — ABNORMAL HIGH (ref 4.0–10.5)
nRBC: 0 % (ref 0.0–0.2)

## 2022-10-14 LAB — TROPONIN I (HIGH SENSITIVITY)
Troponin I (High Sensitivity): 10 ng/L (ref <18)
Troponin I (High Sensitivity): 8 ng/L (ref <18)

## 2022-10-14 LAB — BASIC METABOLIC PANEL
Anion gap: 14 (ref 5–15)
BUN: 47 mg/dL — ABNORMAL HIGH (ref 8–23)
CO2: 18 mmol/L — ABNORMAL LOW (ref 22–32)
Calcium: 10.3 mg/dL (ref 8.9–10.3)
Chloride: 104 mmol/L (ref 98–111)
Creatinine, Ser: 2.22 mg/dL — ABNORMAL HIGH (ref 0.44–1.00)
GFR, Estimated: 22 mL/min — ABNORMAL LOW (ref >60)
Glucose, Bld: 248 mg/dL — ABNORMAL HIGH (ref 70–99)
Potassium: 4.4 mmol/L (ref 3.5–5.1)
Sodium: 136 mmol/L (ref 135–145)

## 2022-10-14 LAB — URINALYSIS, ROUTINE W REFLEX MICROSCOPIC
Bilirubin Urine: NEGATIVE
Glucose, UA: 50 mg/dL — AB
Ketones, ur: NEGATIVE mg/dL
Nitrite: POSITIVE — AB
Protein, ur: NEGATIVE mg/dL
Specific Gravity, Urine: 1.008 (ref 1.005–1.030)
WBC, UA: >50 WBC/hpf (ref 0–5)
pH: 5 (ref 5.0–8.0)

## 2022-10-14 LAB — RESP PANEL BY RT-PCR (RSV, FLU A&B, COVID)  RVPGX2
Influenza A by PCR: NEGATIVE
Influenza B by PCR: NEGATIVE
Resp Syncytial Virus by PCR: NEGATIVE
SARS Coronavirus 2 by RT PCR: NEGATIVE

## 2022-10-14 LAB — MAGNESIUM: Magnesium: 2 mg/dL (ref 1.7–2.4)

## 2022-10-14 LAB — CBG MONITORING, ED: Glucose-Capillary: 205 mg/dL — ABNORMAL HIGH (ref 70–99)

## 2022-10-14 MED ORDER — SODIUM CHLORIDE 0.9 % IV SOLN
1.0000 g | Freq: Once | INTRAVENOUS | Status: AC
  Administered 2022-10-14: 1 g via INTRAVENOUS
  Filled 2022-10-14: qty 10

## 2022-10-14 MED ORDER — LACTATED RINGERS IV SOLN: INTRAVENOUS | Status: DC

## 2022-10-14 MED ORDER — LACTATED RINGERS IV BOLUS
500.0000 mL | Freq: Once | INTRAVENOUS | Status: AC
  Administered 2022-10-14: 500 mL via INTRAVENOUS

## 2022-10-14 NOTE — ED Notes (Signed)
Notified phlebotomy of need for repeat trop

## 2022-10-14 NOTE — ED Notes (Addendum)
This RN was told by EDT that pt's daughter is refusing CXR and questioning why pt is being hooked up to cardiac monitor. Notified EDP.

## 2022-10-14 NOTE — ED Notes (Signed)
Per pt's daughter pt was found sitting on her bedside commode snoring and has a hx of similar syncopal episodes.

## 2022-10-14 NOTE — ED Notes (Signed)
In and out cath performed per MD order using sterile technique and assistance from ED Tech. Patient responded well and urine was sent down.
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
## 2022-10-14 NOTE — ED Notes (Signed)
Pt transported to xray 

## 2022-10-14 NOTE — ED Triage Notes (Signed)
Pt BIB [facility] from home d/t witnessed syncopal episode, did have LOC for 2 min. Pt does not remember the event. Recent Dx with dementia, Hx of TIA & DVT. Pt denies any pain or SOB. Family reports she is at her orientation baseline but is just a little more lethargic than usual. 12L was good showing SB @ 60 bpm, 132/70, 210 CBG.

## 2022-10-15 ENCOUNTER — Observation Stay (HOSPITAL_COMMUNITY): Payer: Medicare Other

## 2022-10-15 DIAGNOSIS — Z7901 Long term (current) use of anticoagulants: Secondary | ICD-10-CM | POA: Diagnosis not present

## 2022-10-15 DIAGNOSIS — N390 Urinary tract infection, site not specified: Secondary | ICD-10-CM | POA: Diagnosis present

## 2022-10-15 DIAGNOSIS — N179 Acute kidney failure, unspecified: Secondary | ICD-10-CM

## 2022-10-15 DIAGNOSIS — B9620 Unspecified Escherichia coli [E. coli] as the cause of diseases classified elsewhere: Secondary | ICD-10-CM | POA: Diagnosis present

## 2022-10-15 DIAGNOSIS — E1121 Type 2 diabetes mellitus with diabetic nephropathy: Secondary | ICD-10-CM

## 2022-10-15 DIAGNOSIS — M7989 Other specified soft tissue disorders: Secondary | ICD-10-CM | POA: Diagnosis not present

## 2022-10-15 DIAGNOSIS — Z8049 Family history of malignant neoplasm of other genital organs: Secondary | ICD-10-CM | POA: Diagnosis not present

## 2022-10-15 DIAGNOSIS — I10 Essential (primary) hypertension: Secondary | ICD-10-CM

## 2022-10-15 DIAGNOSIS — R55 Syncope and collapse: Secondary | ICD-10-CM

## 2022-10-15 DIAGNOSIS — S9032XA Contusion of left foot, initial encounter: Secondary | ICD-10-CM

## 2022-10-15 DIAGNOSIS — E1122 Type 2 diabetes mellitus with diabetic chronic kidney disease: Secondary | ICD-10-CM | POA: Diagnosis present

## 2022-10-15 DIAGNOSIS — N3000 Acute cystitis without hematuria: Secondary | ICD-10-CM

## 2022-10-15 DIAGNOSIS — Z95828 Presence of other vascular implants and grafts: Secondary | ICD-10-CM | POA: Diagnosis not present

## 2022-10-15 DIAGNOSIS — K219 Gastro-esophageal reflux disease without esophagitis: Secondary | ICD-10-CM | POA: Diagnosis present

## 2022-10-15 DIAGNOSIS — N1832 Chronic kidney disease, stage 3b: Secondary | ICD-10-CM | POA: Diagnosis present

## 2022-10-15 DIAGNOSIS — Z86718 Personal history of other venous thrombosis and embolism: Secondary | ICD-10-CM | POA: Diagnosis not present

## 2022-10-15 DIAGNOSIS — Z8669 Personal history of other diseases of the nervous system and sense organs: Secondary | ICD-10-CM

## 2022-10-15 DIAGNOSIS — E1165 Type 2 diabetes mellitus with hyperglycemia: Secondary | ICD-10-CM | POA: Diagnosis present

## 2022-10-15 DIAGNOSIS — Z833 Family history of diabetes mellitus: Secondary | ICD-10-CM | POA: Diagnosis not present

## 2022-10-15 DIAGNOSIS — Z7984 Long term (current) use of oral hypoglycemic drugs: Secondary | ICD-10-CM | POA: Diagnosis not present

## 2022-10-15 DIAGNOSIS — Z800 Family history of malignant neoplasm of digestive organs: Secondary | ICD-10-CM | POA: Diagnosis not present

## 2022-10-15 DIAGNOSIS — Z1152 Encounter for screening for COVID-19: Secondary | ICD-10-CM | POA: Diagnosis not present

## 2022-10-15 DIAGNOSIS — Z841 Family history of disorders of kidney and ureter: Secondary | ICD-10-CM | POA: Diagnosis not present

## 2022-10-15 DIAGNOSIS — Z8249 Family history of ischemic heart disease and other diseases of the circulatory system: Secondary | ICD-10-CM | POA: Diagnosis not present

## 2022-10-15 DIAGNOSIS — Z79899 Other long term (current) drug therapy: Secondary | ICD-10-CM | POA: Diagnosis not present

## 2022-10-15 DIAGNOSIS — Z7982 Long term (current) use of aspirin: Secondary | ICD-10-CM | POA: Diagnosis not present

## 2022-10-15 DIAGNOSIS — I129 Hypertensive chronic kidney disease with stage 1 through stage 4 chronic kidney disease, or unspecified chronic kidney disease: Secondary | ICD-10-CM | POA: Diagnosis present

## 2022-10-15 DIAGNOSIS — L0889 Other specified local infections of the skin and subcutaneous tissue: Secondary | ICD-10-CM | POA: Diagnosis present

## 2022-10-15 DIAGNOSIS — N3001 Acute cystitis with hematuria: Secondary | ICD-10-CM | POA: Diagnosis present

## 2022-10-15 DIAGNOSIS — Z803 Family history of malignant neoplasm of breast: Secondary | ICD-10-CM | POA: Diagnosis not present

## 2022-10-15 DIAGNOSIS — Z90710 Acquired absence of both cervix and uterus: Secondary | ICD-10-CM | POA: Diagnosis not present

## 2022-10-15 HISTORY — DX: Urinary tract infection, site not specified: N39.0

## 2022-10-15 LAB — ECHOCARDIOGRAM COMPLETE
Area-P 1/2: 2.37 cm2
Height: 59 in
MV VTI: 1.32 cm2
S' Lateral: 1.8 cm
Weight: 1760.15 oz

## 2022-10-15 LAB — CBC
HCT: 36.2 % (ref 36.0–46.0)
Hemoglobin: 12.4 g/dL (ref 12.0–15.0)
MCH: 29.9 pg (ref 26.0–34.0)
MCHC: 34.3 g/dL (ref 30.0–36.0)
MCV: 87.2 fL (ref 80.0–100.0)
Platelets: 164 10*3/uL (ref 150–400)
RBC: 4.15 MIL/uL (ref 3.87–5.11)
RDW: 14.8 % (ref 11.5–15.5)
WBC: 10.9 10*3/uL — ABNORMAL HIGH (ref 4.0–10.5)
nRBC: 0 % (ref 0.0–0.2)

## 2022-10-15 LAB — BASIC METABOLIC PANEL
Anion gap: 10 (ref 5–15)
BUN: 49 mg/dL — ABNORMAL HIGH (ref 8–23)
CO2: 20 mmol/L — ABNORMAL LOW (ref 22–32)
Calcium: 9.4 mg/dL (ref 8.9–10.3)
Chloride: 105 mmol/L (ref 98–111)
Creatinine, Ser: 2.04 mg/dL — ABNORMAL HIGH (ref 0.44–1.00)
GFR, Estimated: 24 mL/min — ABNORMAL LOW (ref >60)
Glucose, Bld: 202 mg/dL — ABNORMAL HIGH (ref 70–99)
Potassium: 3.4 mmol/L — ABNORMAL LOW (ref 3.5–5.1)
Sodium: 135 mmol/L (ref 135–145)

## 2022-10-15 LAB — MRSA NEXT GEN BY PCR, NASAL: MRSA by PCR Next Gen: NOT DETECTED

## 2022-10-15 LAB — GLUCOSE, CAPILLARY
Glucose-Capillary: 160 mg/dL — ABNORMAL HIGH (ref 70–99)
Glucose-Capillary: 151 mg/dL — ABNORMAL HIGH (ref 70–99)
Glucose-Capillary: 139 mg/dL — ABNORMAL HIGH (ref 70–99)
Glucose-Capillary: 131 mg/dL — ABNORMAL HIGH (ref 70–99)

## 2022-10-15 LAB — BRAIN NATRIURETIC PEPTIDE: B Natriuretic Peptide: 162 pg/mL — ABNORMAL HIGH (ref 0.0–100.0)

## 2022-10-15 MED ORDER — INSULIN ASPART 100 UNIT/ML IJ SOLN
0.0000 [IU] | Freq: Three times a day (TID) | INTRAMUSCULAR | Status: DC
  Administered 2022-10-15 (×2): 2 [IU] via SUBCUTANEOUS
  Administered 2022-10-15: 1 [IU] via SUBCUTANEOUS
  Administered 2022-10-16: 2 [IU] via SUBCUTANEOUS

## 2022-10-15 MED ORDER — PANTOPRAZOLE SODIUM 40 MG PO TBEC
40.0000 mg | DELAYED_RELEASE_TABLET | Freq: Every day | ORAL | Status: DC
  Administered 2022-10-15 – 2022-10-17 (×3): 40 mg via ORAL
  Filled 2022-10-15 (×3): qty 1

## 2022-10-15 MED ORDER — SODIUM CHLORIDE 0.9 % IV SOLN
1.0000 g | INTRAVENOUS | Status: DC
  Administered 2022-10-15 – 2022-10-16 (×2): 1 g via INTRAVENOUS
  Filled 2022-10-15 (×2): qty 10

## 2022-10-15 MED ORDER — PERFLUTREN LIPID MICROSPHERE
1.0000 mL | INTRAVENOUS | Status: AC | PRN
  Administered 2022-10-15: 2 mL via INTRAVENOUS
  Filled 2022-10-15: qty 10

## 2022-10-15 MED ORDER — FOLIC ACID 1 MG PO TABS
1.0000 mg | ORAL_TABLET | Freq: Every day | ORAL | Status: DC
  Administered 2022-10-15 – 2022-10-17 (×3): 1 mg via ORAL
  Filled 2022-10-15 (×3): qty 1

## 2022-10-15 MED ORDER — SODIUM CHLORIDE 0.9 % IV SOLN: INTRAVENOUS | Status: DC | PRN

## 2022-10-15 MED ORDER — POTASSIUM CHLORIDE CRYS ER 20 MEQ PO TBCR
40.0000 meq | EXTENDED_RELEASE_TABLET | Freq: Once | ORAL | Status: AC
  Administered 2022-10-15: 40 meq via ORAL
  Filled 2022-10-15: qty 2

## 2022-10-15 MED ORDER — INSULIN ASPART 100 UNIT/ML IJ SOLN: 0.0000 [IU] | Freq: Every day | INTRAMUSCULAR | Status: DC

## 2022-10-15 MED ORDER — CARVEDILOL 25 MG PO TABS
25.0000 mg | ORAL_TABLET | Freq: Two times a day (BID) | ORAL | Status: DC
  Administered 2022-10-15 – 2022-10-17 (×5): 25 mg via ORAL
  Filled 2022-10-15 (×5): qty 1

## 2022-10-15 MED ORDER — ACETAMINOPHEN 325 MG PO TABS
650.0000 mg | ORAL_TABLET | Freq: Four times a day (QID) | ORAL | Status: DC | PRN
  Administered 2022-10-16: 650 mg via ORAL
  Filled 2022-10-15: qty 2

## 2022-10-15 MED ORDER — ENOXAPARIN SODIUM 30 MG/0.3ML IJ SOSY
30.0000 mg | PREFILLED_SYRINGE | INTRAMUSCULAR | Status: DC
  Administered 2022-10-15 – 2022-10-16 (×2): 30 mg via SUBCUTANEOUS
  Filled 2022-10-15 (×2): qty 0.3

## 2022-10-15 MED ORDER — ASPIRIN 81 MG PO TBEC
81.0000 mg | DELAYED_RELEASE_TABLET | Freq: Every day | ORAL | Status: DC
  Filled 2022-10-15: qty 1

## 2022-10-15 MED ORDER — ACETAMINOPHEN 650 MG RE SUPP: 650.0000 mg | Freq: Four times a day (QID) | RECTAL | Status: DC | PRN

## 2022-10-15 MED ORDER — THIAMINE MONONITRATE 100 MG PO TABS
100.0000 mg | ORAL_TABLET | Freq: Every day | ORAL | Status: DC
  Administered 2022-10-15 – 2022-10-16 (×2): 100 mg via ORAL
  Filled 2022-10-15 (×2): qty 1

## 2022-10-15 MED ORDER — ASPIRIN 81 MG PO TBEC
81.0000 mg | DELAYED_RELEASE_TABLET | Freq: Every day | ORAL | Status: DC
  Administered 2022-10-15 – 2022-10-16 (×2): 81 mg via ORAL
  Filled 2022-10-15 (×2): qty 1

## 2022-10-15 MED ORDER — ONDANSETRON HCL 4 MG/2ML IJ SOLN: 4.0000 mg | Freq: Four times a day (QID) | INTRAMUSCULAR | Status: DC | PRN

## 2022-10-15 MED ORDER — ONDANSETRON HCL 4 MG PO TABS: 4.0000 mg | ORAL_TABLET | Freq: Four times a day (QID) | ORAL | Status: DC | PRN

## 2022-10-15 NOTE — Hospital Course (Signed)
Brandy Gonzales is a 82 y.o. female with a history of diabetes mellitus type 2, hypertension, CKD stage IIIb.  Patient presented secondary to syncopal episode.  Patient admitted to telemetry for syncopal workup.  Patient also noted to have possible UTI and empiric ceftriaxone started.  Additionally, patient also noted to have worsening of her left foot mass and podiatry is consulted for evaluation recommendations.

## 2022-10-15 NOTE — Assessment & Plan Note (Addendum)
Patient gets recurrent Bell's palsy as reaction to an antibiotic, she and family aren't sure which one however. Pt being switched from doxycycline for possible foot infection to rocephin for suspected UTI based on UA. Mentioning this in sign out as well, just-in-case patient develops facial droop on floor during admit.

## 2022-10-15 NOTE — Assessment & Plan Note (Addendum)
UA suspicious for UTI, WBC 13.3k Stop doxycycline which she was on for foot infection vs hematoma Start empiric rocephin UCx pending, but could be false negative due to recent doxycycline course.

## 2022-10-15 NOTE — Progress Notes (Signed)
  Echocardiogram 2D Echocardiogram has been performed.  Milda Smart 10/15/2022, 1:14 PM

## 2022-10-15 NOTE — Assessment & Plan Note (Signed)
Cont coreg 

## 2022-10-15 NOTE — Progress Notes (Signed)
PROGRESS NOTE    Brandy Gonzales  ZOX:[id number] DOB: 1940-09-10 DOA: 10/14/2022 PCP: Tresa Garter, MD   Brief Narrative: Brandy Gonzales is a 82 y.o. female with a history of diabetes mellitus type 2, hypertension, CKD stage IIIb.  Patient presented secondary to syncopal episode.  Patient admitted to telemetry for syncopal workup.  Patient also noted to have possible UTI and empiric ceftriaxone started.  Additionally, patient also noted to have worsening of her left foot mass and podiatry is consulted for evaluation recommendations.   Assessment and Plan:  Syncope Unclear etiology but concerning for possible vasovagal episode in setting of presumed illness from UTI and AKI.  Patient started on telemetry for observation.  Echocardiogram ordered and is pending. -Follow-up echocardiogram results -PT/OT -Continue telemetry to rule out arrhythmia  UTI Presumed diagnosis. Urinalysis significant for positive nitrites and leukocytes with few bacteria. Urine culture obtained and empiric Ceftriaxone started.  Left foot mass Present on dorsum of left foot.  Slightly fluctuant, tender mass. Noted as an outpatient at podiatrist office.  Patient was managed on doxycycline for possible infection.  Podiatry consulted on admission and will see while inpatient.  AKI on CKD stage 3b Diabetic nephropathy Baseline creatinine of about 1.4-1.6.  Creatinine of 2.22 on admission.  IV fluids started.  Improvement of creatinine to 2.04 at this morning. -Continue IV fluids -Repeat BMP in a.m.  History of Bell's Palsy Noted.  In the past, secondary to antibiotic use.  History of DVT Patient is on Xarelto as an outpatient.  History of IVC filter placement.  Plan was to transition to aspirin and discontinue Xarelto.  Xarelto held on admission. -Continue aspirin  Primary hypertension -Continue Coreg 25 mg twice daily  Diabetes mellitus type 2 Controlled for age based on most recent hemoglobin A1c of  7.8%.  Patient is managed on Farxiga and glimepiride as an outpatient.  May need to consider discontinuing Marcelline Deist if patient develops recurrent UTIs.  DVT prophylaxis: Lovenox Code Status:   Code Status: Full Code Family Communication: Called daughter, but went straight to voicemail Disposition Plan: Discharge home likely in 1-2 days pending transition to outpatient antibiotics in addition to podiatry recommendations   Consultants:  Podiatry  Procedures:  None  Antimicrobials: Ceftriaxone    Subjective: Patient reports no significant issues overnight. Some discomfort of her foot. She confirms her memory is poor.  Objective: BP (!) 154/64 (BP Location: Right Arm)   Pulse 64   Temp 98 F (36.7 C) (Oral)   Resp 18   Ht 4\' 11"  (1.499 m)   Wt 49.9 kg   SpO2 100%   BMI 22.22 kg/m   Examination:  General exam: Appears calm and comfortable Respiratory system: Clear to auscultation. Respiratory effort normal. Cardiovascular system: S1 & S2 heard, RRR Gastrointestinal system: Abdomen is nondistended, soft and nontender. Normal bowel sounds heard. Central nervous system: Alert and oriented. Musculoskeletal: No edema. No calf tenderness Skin: Large, tender and slightly fluctuant mass noted on dorsum of left foot with associated warmth but without erythema. Psychiatry: Memory impaired. Mood & affect appropriate.    Data Reviewed: I have personally reviewed following labs and imaging studies  CBC Lab Results  Component Value Date   WBC 10.9 (H) 10/15/2022   RBC 4.15 10/15/2022   HGB 12.4 10/15/2022   HCT 36.2 10/15/2022   MCV 87.2 10/15/2022   MCH 29.9 10/15/2022   PLT 164 10/15/2022   MCHC 34.3 10/15/2022   RDW 14.8 10/15/2022   LYMPHSABS  2.3 08/07/2022   MONOABS 0.4 08/07/2022   EOSABS 0.1 08/07/2022   BASOSABS 0.0 08/07/2022     Last metabolic panel Lab Results  Component Value Date   NA 135 10/15/2022   K 3.4 (L) 10/15/2022   CL 105 10/15/2022   CO2 20  (L) 10/15/2022   BUN 49 (H) 10/15/2022   CREATININE 2.04 (H) 10/15/2022   GLUCOSE 202 (H) 10/15/2022   GFRNONAA 24 (L) 10/15/2022   GFRAA 33 (L) 05/03/2019   CALCIUM 9.4 10/15/2022   PHOS 3.3 04/28/2022   PROT 6.4 (L) 08/07/2022   ALBUMIN 3.0 (L) 08/07/2022   BILITOT 0.7 08/07/2022   ALKPHOS 153 (H) 08/07/2022   AST 38 08/07/2022   ALT 43 08/07/2022   ANIONGAP 10 10/15/2022    GFR: Estimated Creatinine Clearance: 14.5 mL/min (A) (by C-G formula based on SCr of 2.04 mg/dL (H)).  Recent Results (from the past 240 hour(s))  Resp panel by RT-PCR (RSV, Flu A&B, Covid) Anterior Nasal Swab     Status: None   Collection Time: 10/14/22  5:34 PM   Specimen: Anterior Nasal Swab  Result Value Ref Range Status   SARS Coronavirus 2 by RT PCR NEGATIVE NEGATIVE Final   Influenza A by PCR NEGATIVE NEGATIVE Final   Influenza B by PCR NEGATIVE NEGATIVE Final    Comment: (NOTE) The Xpert Xpress SARS-CoV-2/FLU/RSV plus assay is intended as an aid in the diagnosis of influenza from Nasopharyngeal swab specimens and should not be used as a sole basis for treatment. Nasal washings and aspirates are unacceptable for Xpert Xpress SARS-CoV-2/FLU/RSV testing.  Fact Sheet for Patients: BloggerCourse.com  Fact Sheet for Healthcare Providers: SeriousBroker.it  This test is not yet approved or cleared by the [place] FDA and has been authorized for detection and/or diagnosis of SARS-CoV-2 by FDA under an Emergency Use Authorization (EUA). This EUA will remain in effect (meaning this test can be used) for the duration of the COVID-19 declaration under Section 564(b)(1) of the Act, 21 U.S.C. section 360bbb-3(b)(1), unless the authorization is terminated or revoked.     Resp Syncytial Virus by PCR NEGATIVE NEGATIVE Final    Comment: (NOTE) Fact Sheet for Patients: BloggerCourse.com  Fact Sheet for Healthcare  Providers: SeriousBroker.it  This test is not yet approved or cleared by the [place] FDA and has been authorized for detection and/or diagnosis of SARS-CoV-2 by FDA under an Emergency Use Authorization (EUA). This EUA will remain in effect (meaning this test can be used) for the duration of the COVID-19 declaration under Section 564(b)(1) of the Act, 21 U.S.C. section 360bbb-3(b)(1), unless the authorization is terminated or revoked.  Performed at [facility], [address], [place], [place] [address]   MRSA Next Gen by PCR, Nasal     Status: None   Collection Time: 10/15/22  4:37 AM   Specimen: Nasal Mucosa; Nasal Swab  Result Value Ref Range Status   MRSA by PCR Next Gen NOT DETECTED NOT DETECTED Final    Comment: (NOTE) The GeneXpert MRSA Assay (FDA approved for NASAL specimens only), is one component of a comprehensive MRSA colonization surveillance program. It is not intended to diagnose MRSA infection nor to guide or monitor treatment for MRSA infections. Test performance is not FDA approved in patients less than 14 years old. Performed at [facility], [address] [place], [place] [address]       Radiology Studies: CT Head Wo Contrast  Result Date: 10/14/2022 CLINICAL DATA:  Syncopal episode EXAM: CT HEAD WITHOUT CONTRAST TECHNIQUE: Contiguous axial images were obtained from the base of the skull through the vertex without intravenous contrast. RADIATION DOSE REDUCTION: This exam was performed according to the departmental dose-optimization program which includes automated exposure control, adjustment of the mA and/or kV according to patient size and/or use of iterative reconstruction technique. COMPARISON:  04/27/2022 FINDINGS: Brain: Generalized brain volume loss. Extensive chronic small-vessel ischemic changes affecting the cerebral hemispheric deep and subcortical white matter. No large vessel territory stroke. No mass,  hemorrhage, hydrocephalus or extra-axial collection. Vascular: There is atherosclerotic calcification of the major vessels at the base of the brain. Skull: Negative Sinuses/Orbits: Clear/normal Other: None IMPRESSION: No acute CT finding. Generalized brain volume loss. Extensive chronic small-vessel ischemic changes of the cerebral hemispheric deep and subcortical white matter. Electronically Signed   By: Paulina Fusi M.D.   On: 10/14/2022 18:05   DG Chest 1 View  Result Date: 10/14/2022 CLINICAL DATA:  Syncope EXAM: CHEST  1 VIEW COMPARISON:  X-ray 08/07/2022 FINDINGS: Stable cardiopericardial silhouette. No consolidation, pneumothorax, effusion or edema. Underinflation. Slight curvature of the spine with degenerative changes. Apical pleural thickening. Overlapping cardiac leads. Osteopenia. Stable areas sclerosis along the left humeral shaft. IMPRESSION: No acute cardiopulmonary disease.  Underinflated x-ray Electronically Signed   By: Karen Kays M.D.   On: 10/14/2022 17:48      LOS: 0 days    Jacquelin Hawking, MD [facility] 10/15/2022, 10:56 AM   If 7PM-7AM, please contact night-coverage [email or web address]
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
## 2022-10-15 NOTE — Evaluation (Signed)
Occupational Therapy Evaluation Patient Details Name: Brandy Gonzales MRN: [id number] DOB: [birth year] Today's Date: 10/15/2022   History of Present Illness Brandy Gonzales is a 82 y.o. female presented  due to syncopal episode where she was found unconscious on bedside commode snoring. Also possible UTI. Recently seen on about a week ago at podiatry office, noted to have swelling to top of L foot, suspected to most likely be hematoma in setting of ongoing xarelto use,  PHMx:significant of DM2, HTN, CKD 3b, dementia/Alz   Clinical Impression   This 82 yo female admitted with above presents to acute OT with PLOF per her report of bathing, dressing, and toileting herself from a RW level. Currently pt is setup/S-total A for basic ADLs and S-min A for all mobility. She will continue to benefit from acute OT with follow up HHOT if family can provide 24 hour S/prn A when she is up on her feet. If this cannot be provided then patient will benefit from continued inpatient follow up therapy, <3 hours/day.       Recommendations for follow up therapy are one component of a multi-disciplinary discharge planning process, led by the attending physician.  Recommendations may be updated based on patient status, additional functional criteria and insurance authorization.   Assistance Recommended at Discharge Frequent or constant Supervision/Assistance  Patient can return home with the following A little help with walking and/or transfers;A little help with bathing/dressing/bathroom;Assistance with cooking/housework;Direct supervision/assist for medications management;Assist for transportation;Direct supervision/assist for financial management;Help with stairs or ramp for entrance    Functional Status Assessment  Patient has had a recent decline in their functional status and demonstrates the ability to make significant improvements in function in a reasonable and predictable amount of time.  Equipment Recommendations   None recommended by OT       Precautions / Restrictions Precautions Precautions: Fall Restrictions Weight Bearing Restrictions: No      Mobility Bed Mobility Overal bed mobility: Needs Assistance Bed Mobility: Supine to Sit, Sit to Supine     Supine to sit: Supervision, HOB elevated Sit to supine: Supervision        Transfers Overall transfer level: Needs assistance Equipment used: Rolling walker (2 wheels) Transfers: Sit to/from Stand Sit to Stand: Min assist                  Balance Overall balance assessment: Needs assistance Sitting-balance support: No upper extremity supported, Feet supported Sitting balance-Leahy Scale: Good     Standing balance support: Bilateral upper extremity supported, During functional activity, Reliant on assistive device for balance Standing balance-Leahy Scale: Poor Standing balance comment: standing for A with back peri care post bowel movement                           ADL either performed or assessed with clinical judgement   ADL Overall ADL's : Needs assistance/impaired Eating/Feeding: Independent;Sitting Eating/Feeding Details (indicate cue type and reason): EOB Grooming: Set up;Supervision/safety;Sitting Grooming Details (indicate cue type and reason): EOB Upper Body Bathing: Supervision/ safety;Set up;Sitting Upper Body Bathing Details (indicate cue type and reason): EOB Lower Body Bathing: Minimal assistance;Sit to/from stand Lower Body Bathing Details (indicate cue type and reason): and min A to maintain standing Upper Body Dressing : Set up;Supervision/safety;Sitting   Lower Body Dressing: Minimal assistance;Sit to/from stand Lower Body Dressing Details (indicate cue type and reason): and min A to maintain standing Toilet Transfer: Minimal assistance;Stand-pivot;BSC/3in1;Rolling walker (2 wheels)  Toileting- Clothing Manipulation and Hygiene: Total assistance Toileting - Clothing Manipulation Details  (indicate cue type and reason): and min A to maintain standing             Vision Baseline Vision/History: 1 Wears glasses Ability to See in Adequate Light: 0 Adequate Patient Visual Report: No change from baseline              Pertinent Vitals/Pain Pain Assessment Pain Assessment: No/denies pain     Hand Dominance Right   Extremity/Trunk Assessment Upper Extremity Assessment Upper Extremity Assessment: Overall WFL for tasks assessed           Communication Communication Communication: HOH   Cognition Arousal/Alertness: Awake/alert Behavior During Therapy: WFL for tasks assessed/performed Overall Cognitive Status: History of cognitive impairments - at baseline                                 General Comments: may be worse now due to UTI; kept stating "I don't know why I am peeing so much"--each time I would tell her she had a urinary tract infection. As I left her room I asked her if she remembered why she is peeing so much and she said "I have an infection" and I said yes, you have a urinary tract infection.     General Comments  VSS on RA            Home Living Family/patient expects to be discharged to:: Private residence Living Arrangements: Children Available Help at Discharge: Family;Available 24 hours/day Type of Home: House Home Access: Ramped entrance     Home Layout: One level     Bathroom Shower/Tub: Tub/shower unit;Sponge bathes at baseline   Bathroom Toilet: Standard     Home Equipment: Firefighter          Prior Functioning/Environment Prior Level of Function : Needs assist             Mobility Comments: reports ambulates with walker at baseline ADLs Comments: performs ADLs without assist        OT Problem List: Impaired balance (sitting and/or standing);Decreased cognition      OT Treatment/Interventions: Self-care/ADL training;Balance training;DME and/or AE instruction    OT Goals(Current goals  can be found in the care plan section) Acute Rehab OT Goals Patient Stated Goal: to go home OT Goal Formulation: With patient Time For Goal Achievement: 10/29/22 Potential to Achieve Goals: Good  OT Frequency: Min 1X/week       AM-PAC OT "6 Clicks" Daily Activity     Outcome Measure Help from another person eating meals?: None Help from another person taking care of personal grooming?: A Little Help from another person toileting, which includes using toliet, bedpan, or urinal?: A Lot Help from another person bathing (including washing, rinsing, drying)?: A Little Help from another person to put on and taking off regular upper body clothing?: A Little Help from another person to put on and taking off regular lower body clothing?: A Little 6 Click Score: 18   End of Session Equipment Utilized During Treatment: Gait belt;Rolling walker (2 wheels) Nurse Communication: Mobility status (pt with had bowel movement and urinated 2 times while I was with her)  Activity Tolerance: Patient tolerated treatment well Patient left: in bed;with call bell/phone within reach;with bed alarm set  OT Visit Diagnosis: Unsteadiness on feet (R26.81);Other abnormalities of gait and mobility (R26.89);Other symptoms and signs involving cognitive function  Time: 7829-5621 OT Time Calculation (min): 46 min Charges:  OT General Charges $OT Visit: 1 Visit OT Evaluation $OT Eval Moderate Complexity: 1 Mod OT Treatments $Self Care/Home Management : 23-37 mins  Lindon Romp OT Acute Rehabilitation Services Office [phone]    Evette Georges 10/15/2022, 3:09 PM

## 2022-10-15 NOTE — Progress Notes (Addendum)
Mobility Specialist Progress Note   10/15/22 1118  Mobility  Activity Transferred from bed to chair  Level of Assistance +2 (takes two people)  Assistive Device Other (Comment) (HHA)  Distance Ambulated (ft) 12 ft  Activity Response Tolerated well  Mobility Referral Yes  $Mobility charge 1 Mobility  Mobility Specialist Start Time (ACUTE ONLY) 1038  Mobility Specialist Stop Time (ACUTE ONLY) 1115  Mobility Specialist Time Calculation (min) (ACUTE ONLY) 37 min   Pre Mobility: 62 HR, 152/62 BP Post Mobility: 60 HR, 148/66 BP  Received pt in bed having no complaints and agreeable. Able to get EOB w/ Stand by assist and stand w/ +2A minA for safety. Transferred to chair w/ antalgic like gait but no complaints. Left in chair w/ call bell and chair alarm on.   Frederico Hamman Mobility Specialist Please contact via SecureChat or  Rehab office at [phone]

## 2022-10-15 NOTE — Assessment & Plan Note (Signed)
Hold farxiga and amaryl in setting of AKI Use sensitive SSI AC/HS for the moment.

## 2022-10-15 NOTE — TOC Initial Note (Signed)
Transition of Care [facility]) - Initial/Assessment Note    Patient Details  Name: Brandy Gonzales MRN: [id number] Date of Birth: 04-Jan-1941  Transition of Care [facility]) CM/SW Contact:    Tom-Johnson, Hershal Coria, RN Phone Number: 10/15/2022, 12:09 PM  Clinical Narrative:                  CM spoke with patient at bedside about needs for post hospital transition. Admitted after a Syncopal episode at home. Found to have AKI/UTI. On IV abx. Patient was taking Doxycycline for Lt Foot infection at home. Podiatry following.   Patient is from home with son, daughter and great grand son. Family assists with care and transports to and from appointments. Has a cane, walker and w/c at home.  PCP is [name], [name], MD and uses [facility] on [address]. Awaits PT/OT eval for disposition. CM will continue to follow as patient progresses with care towards discharge.          Barriers to Discharge: Continued Medical Work up   Patient Goals and CMS Choice Patient states their goals for this hospitalization and ongoing recovery are:: To return home CMS Medicare.gov Compare Post Acute Care list provided to:: Patient Choice offered to / list presented to : Patient      Expected Discharge Plan and Services   Discharge Planning Services: CM Consult   Living arrangements for the past 2 months: Single Family Home                                      Prior Living Arrangements/Services Living arrangements for the past 2 months: Single Family Home Lives with:: Adult Children, Minor Children (Son, daughter and great grand son.) Patient language and need for interpreter reviewed:: Yes Do you feel safe going back to the place where you live?: Yes      Need for Family Participation in Patient Care: Yes (Comment) Care giver support system in place?: Yes (comment) Current home services: DME (Cane, walker, w/c) Criminal Activity/Legal Involvement Pertinent to Current  Situation/Hospitalization: No - Comment as needed  Activities of Daily Living Home Assistive Devices/Equipment: CBG Meter, Walker (specify type) ADL Screening (condition at time of admission) Patient's cognitive ability adequate to safely complete daily activities?: Yes Is the patient deaf or have difficulty hearing?: No Does the patient have difficulty seeing, even when wearing glasses/contacts?: No Does the patient have difficulty concentrating, remembering, or making decisions?: No Patient able to express need for assistance with ADLs?: Yes Does the patient have difficulty walking or climbing stairs?: Yes Weakness of Legs: Both Weakness of Arms/Hands: Both  Permission Sought/Granted Permission sought to share information with : Case Manager, Family Supports Permission granted to share information with : Yes, Verbal Permission Granted              Emotional Assessment Appearance:: Appears stated age Attitude/Demeanor/Rapport: Engaged, Gracious Affect (typically observed): Accepting, Appropriate, Calm, Hopeful, Pleasant Orientation: : Oriented to Self, Oriented to Place, Oriented to  Time, Oriented to Situation Alcohol / Substance Use: Not Applicable Psych Involvement: No (comment)  Admission diagnosis:  Syncope [R55] Acute cystitis with hematuria [N30.01] AKI (acute kidney injury) (HCC) [N17.9] Patient Active Problem List   Diagnosis Date Noted   UTI (urinary tract infection) 10/15/2022   History of Bell's palsy 10/15/2022   Hematoma 10/09/2022   Anemia 06/11/2022   AKI (acute kidney injury) (HCC) 06/10/2022   Acute  deep vein thrombosis of left iliac vein (HCC) 05/18/2022   Current long-term use of anticoagulant medication with history of deep venous thrombosis 04/2022 (DVT) 05/18/2022   History of insertion of IVC (inferior vena caval) filter 2006 05/18/2022   Swelling of left foot 05/18/2022   DVT (deep venous thrombosis) (HCC) 05/05/2022   Influenza A with pneumonia  05/02/2022   Protein-calorie malnutrition, severe (HCC) 04/28/2022   Closed left hip fracture, initial encounter (HCC) 04/25/2022   Memory loss 12/10/2021   Bell palsy 12/10/2021   Acute bronchitis 10/21/2021   Dyslipidemia 04/11/2020   Abdominal enlargement 04/05/2018   Stage 3b chronic kidney disease (CKD) (HCC) 11/30/2017   Leg wound, right 08/08/2015   Gastroenteritis, acute 04/22/2015   OAB (overactive bladder) 04/22/2015   Leg wound, left 11/01/2014   Edema 11/01/2014   Seborrheic dermatitis 09/26/2013   Dysphagia, unspecified(787.20) 01/23/2013   Syncope 10/03/2012   Well adult exam 09/22/2012   Weight gain 09/22/2012   Cystitis 09/22/2012   Nonspecific abnormal electrocardiogram (ECG) (EKG) 09/22/2012   Osteoarthritis 05/19/2012   Leg pain, bilateral 01/13/2012   Chest pain 09/10/2011   Glaucoma 09/10/2011   Dysuria 07/17/2009   Diabetic nephropathy (HCC) 06/27/2008   Uncontrolled type 2 diabetes mellitus with hyperglycemia, without long-term current use of insulin (HCC) 04/27/2007   Essential hypertension 04/27/2007   GERD 04/27/2007   PCP:  Tresa Garter, MD Pharmacy:   [facility] [phone] - [place], [place] - [address] AT [facility] OF [address] [place] [address] Phone: [phone] Fax: [phone]  [facility] - [place] - [address] [place] [address] Phone: [phone] Fax: [phone]  [id number] [place], [place] - [address] AT [facility] [address] [place] [address] Phone: [phone] Fax: [phone]     Social Determinants of Health (SDOH) Social History: SDOH Screenings   Food Insecurity: No Food Insecurity (06/15/2022)  Housing: Low Risk  (06/11/2022)  Transportation Needs: No Transportation Needs (06/15/2022)  Utilities: Not At Risk (06/12/2022)  Alcohol Screen: Low Risk   (03/24/2021)  Depression (PHQ2-9): Low Risk  (08/26/2022)  Financial Resource Strain: Low Risk  (03/24/2021)  Physical Activity: Inactive (03/24/2021)  Social Connections: Moderately Isolated (03/24/2021)  Stress: No Stress Concern Present (03/24/2021)  Tobacco Use: Low Risk  (10/14/2022)   SDOH Interventions: Transportation Interventions: Intervention Not Indicated, Inpatient TOC, Patient Resources (Friends/Family)   Readmission Risk Interventions    06/12/2022   11:19 AM 05/20/2022    4:35 PM  Readmission Risk Prevention Plan  Transportation Screening Complete Complete  PCP or Specialist Appt within 3-5 Days Complete Complete  HRI or Home Care Consult Complete   Social Work Consult for Recovery Care Planning/Counseling Complete Complete  Palliative Care Screening Not Applicable Not Applicable  Medication Review Oceanographer) Complete Complete
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
## 2022-10-15 NOTE — Plan of Care (Signed)
  Problem: Elimination: Goal: Will not experience complications related to bowel motility Outcome: Progressing   

## 2022-10-15 NOTE — Assessment & Plan Note (Signed)
Looks like pt has CKD 3b at baseline with creat 1.4-1.6 as of earlier this year.

## 2022-10-15 NOTE — Assessment & Plan Note (Signed)
Suspect pre-renal in setting of UTI. IVF Hold farxiga Repeat BMP in AM Strict intake and output

## 2022-10-15 NOTE — Progress Notes (Addendum)
  Echocardiogram 2D Echocardiogram attempted at 1118. NT performing bedside care at this time. Will attempt again as schedule permits.  Milda Smart 10/15/2022, 11:18 AM
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
## 2022-10-15 NOTE — Assessment & Plan Note (Addendum)
H/o vaso-vagal syncope in past when ill, suspect same here once again in setting of UTI + AKI. Never the less: has been ~10 years since her last 2d echo it looks like, so ill go ahead and order one just to exclude some of the obvious cardiogenic causes. And put pt on tele monitor during stay here for obs.

## 2022-10-15 NOTE — Assessment & Plan Note (Signed)
H/o IVC filter placement in 2006

## 2022-10-16 DIAGNOSIS — N179 Acute kidney failure, unspecified: Secondary | ICD-10-CM | POA: Diagnosis not present

## 2022-10-16 LAB — GLUCOSE, CAPILLARY
Glucose-Capillary: 120 mg/dL — ABNORMAL HIGH (ref 70–99)
Glucose-Capillary: 152 mg/dL — ABNORMAL HIGH (ref 70–99)
Glucose-Capillary: 76 mg/dL (ref 70–99)
Glucose-Capillary: 154 mg/dL — ABNORMAL HIGH (ref 70–99)

## 2022-10-16 LAB — BASIC METABOLIC PANEL
Anion gap: 14 (ref 5–15)
BUN: 39 mg/dL — ABNORMAL HIGH (ref 8–23)
CO2: 19 mmol/L — ABNORMAL LOW (ref 22–32)
Calcium: 9.4 mg/dL (ref 8.9–10.3)
Chloride: 107 mmol/L (ref 98–111)
Creatinine, Ser: 1.74 mg/dL — ABNORMAL HIGH (ref 0.44–1.00)
GFR, Estimated: 29 mL/min — ABNORMAL LOW (ref >60)
Glucose, Bld: 105 mg/dL — ABNORMAL HIGH (ref 70–99)
Potassium: 4.8 mmol/L (ref 3.5–5.1)
Sodium: 140 mmol/L (ref 135–145)

## 2022-10-16 MED ORDER — FLUCONAZOLE 100 MG PO TABS
100.0000 mg | ORAL_TABLET | Freq: Every day | ORAL | Status: DC
  Administered 2022-10-16 – 2022-10-17 (×2): 100 mg via ORAL
  Filled 2022-10-16 (×2): qty 1

## 2022-10-16 NOTE — Evaluation (Signed)
Physical Therapy Evaluation Patient Details Name: Brandy Gonzales MRN: [id number] DOB: 06-25-1940 Today's Date: 10/16/2022  History of Present Illness  Brandy Gonzales is a 82 y.o. female presented  due to syncopal episode where she was found unconscious on bedside commode snoring. Also possible UTI. Recently seen on about a week ago at podiatry office, noted to have swelling to top of L foot, suspected to most likely be hematoma in setting of ongoing xarelto use,  PHMx:significant of DM2, HTN, CKD 3b, dementia/Alz  Clinical Impression   Upon evaluation, patient supine in bed with HOB elevated with family present. Pt and family eager to return home and motivated for therapy today. Prior to admission, patient utilized a RW for home and community ambulation. She is supported by her children who are available to provide 24 hr support when needed. Currently she is able to perform functional transfers and bed mobility without physical assistance but requires min guard for safety. Pt presents with cognition impairments to time and slowed processing with transfers. Required multimodal cues for transfers from EOB to chair. Patient would benefit from skilled physical therapy in order to address improvements with functional mobility. Anticipating discharge to home with home health physical therapy.       Recommendations for follow up therapy are one component of a multi-disciplinary discharge planning process, led by the attending physician.  Recommendations may be updated based on patient status, additional functional criteria and insurance authorization.  Follow Up Recommendations       Assistance Recommended at Discharge Frequent or constant Supervision/Assistance  Patient can return home with the following  A little help with walking and/or transfers;A little help with bathing/dressing/bathroom;Assistance with cooking/housework;Assist for transportation;Help with stairs or ramp for entrance    Equipment  Recommendations None recommended by PT  Recommendations for Other Services       Functional Status Assessment Patient has had a recent decline in their functional status and demonstrates the ability to make significant improvements in function in a reasonable and predictable amount of time.     Precautions / Restrictions Precautions Precautions: Fall Restrictions Weight Bearing Restrictions: No      Mobility  Bed Mobility Overal bed mobility: Needs Assistance Bed Mobility: Rolling, Sidelying to Sit Rolling: Supervision Sidelying to sit: Min guard       General bed mobility comments: HOB lowered to simulate home set up. Patient has rail towards [facility] at home; able to perform bed mobility without physical assistance. Required verbal cue for LE management from sidelying to EOB.    Transfers Overall transfer level: Needs assistance Equipment used: Rolling walker (2 wheels) Transfers: Sit to/from Stand, Bed to chair/wheelchair/BSC Sit to Stand: Min guard   Step pivot transfers: Min guard       General transfer comment: Transferred from EOB to standing with min guard, multiple cues for hand placement. Pt would repeat instructions but then needed tactile cue for one hand on bed. Able to perform step pivot from EOB to [facility]. Transferring into the recliner, pt required cue for lateral step before sitting.    Ambulation/Gait               General Gait Details: Deferred due to pain from the left foot.  Stairs            Wheelchair Mobility    Modified Rankin (Stroke Patients Only)       Balance  Pertinent Vitals/Pain Pain Assessment Pain Assessment: No/denies pain    Home Living Family/patient expects to be discharged to:: Private residence Living Arrangements: Children Available Help at Discharge: Family;Available 24 hours/day Type of Home: House Home Access: Ramped entrance       Home  Layout: One level Home Equipment: Agricultural consultant (2 wheels);Rollator (4 wheels);Standard Walker;Cane - single point      Prior Function Prior Level of Function : Needs assist             Mobility Comments: reports ambulates with walker at baseline ADLs Comments: performs ADLs without assist     Hand Dominance   Dominant Hand: Right    Extremity/Trunk Assessment   Upper Extremity Assessment Upper Extremity Assessment: Defer to OT evaluation    Lower Extremity Assessment Lower Extremity Assessment: Overall WFL for tasks assessed (Performed Formal strength testing in Supine and EOB.)       Communication   Communication: HOH  Cognition Arousal/Alertness: Awake/alert Behavior During Therapy: WFL for tasks assessed/performed   Area of Impairment: Orientation, Memory, Attention, Following commands, Problem solving, Awareness                 Orientation Level: Disoriented to, Time (Able to recognize PT and hospital.) Current Attention Level: Focused Memory: Decreased short-term memory Following Commands: Follows one step commands consistently   Awareness: Intellectual Problem Solving: Slow processing, Requires verbal cues, Difficulty sequencing General Comments: Pt able to recall why she was in the hospital but could recall orientation to time. Patient corrected by family when asked about meals, equipment at home and voiding. Pt asked to be transferred to bed side commode for urinary void. Then told PT "I didn't need to use the bathroom".        General Comments General comments (skin integrity, edema, etc.): Patient has hematoma on left foot, painful with palpation and AROM.    Exercises     Assessment/Plan    PT Assessment Patient needs continued PT services  PT Problem List Decreased strength;Decreased range of motion;Decreased activity tolerance;Decreased balance;Decreased mobility;Decreased knowledge of use of DME;Decreased safety awareness;Decreased  knowledge of precautions;Pain;Decreased cognition       PT Treatment Interventions DME instruction;Gait training;Functional mobility training;Therapeutic activities;Therapeutic exercise;Balance training;Patient/family education;Cognitive remediation    PT Goals (Current goals can be found in the Care Plan section)  Acute Rehab PT Goals Patient Stated Goal: Family reported want for patient to return to home. PT Goal Formulation: With patient/family Time For Goal Achievement: 10/23/22 Potential to Achieve Goals: Good    Frequency Min 3X/week     Co-evaluation               AM-PAC PT "6 Clicks" Mobility  Outcome Measure Help needed turning from your back to your side while in a flat bed without using bedrails?: A Little Help needed moving from lying on your back to sitting on the side of a flat bed without using bedrails?: A Little Help needed moving to and from a bed to a chair (including a wheelchair)?: A Little Help needed standing up from a chair using your arms (e.g., wheelchair or bedside chair)?: A Little Help needed to walk in hospital room?: A Little Help needed climbing 3-5 steps with a railing? : A Lot 6 Click Score: 17    End of Session Equipment Utilized During Treatment: Gait belt Activity Tolerance: Patient tolerated treatment well Patient left: in chair;with call bell/phone within reach;with chair alarm set;with family/visitor present Nurse Communication: Mobility status PT Visit Diagnosis:  Unsteadiness on feet (R26.81);Pain Pain - Right/Left: Left Pain - part of body: Ankle and joints of foot    Time: 1436-1511 PT Time Calculation (min) (ACUTE ONLY): 35 min   Charges:   PT Evaluation $PT Eval Moderate Complexity: 1 Mod PT Treatments $Gait Training: 8-22 mins        [name], SPT Acute Rehabilitation Services [phone] Secure chat preferred    [name] 10/16/2022, 3:54 PM

## 2022-10-16 NOTE — TOC Progression Note (Signed)
Transition of Care [facility]) - Progression Note    Patient Details  Name: Brandy Gonzales MRN: [id number] Date of Birth: 06-Apr-1941  Transition of Care [facility]) CM/SW Contact  Tom-Johnson, Hershal Coria, RN Phone Number: 10/16/2022, 4:07 PM  Clinical Narrative:     Home health PT/OT recommended, patient states she was recently with [facility] 09/29/22 and would like to use their services again.  Order placed and referral called in to [facility] with acceptance voiced, info on AVS.  CM will continue to follow as patient progresses with care towards discharge.      Barriers to Discharge: Continued Medical Work up  Expected Discharge Plan and Services   Discharge Planning Services: CM Consult   Living arrangements for the past 2 months: Single Family Home                                       Social Determinants of Health (SDOH) Interventions SDOH Screenings   Food Insecurity: No Food Insecurity (06/15/2022)  Housing: Low Risk  (06/11/2022)  Transportation Needs: No Transportation Needs (06/15/2022)  Utilities: Not At Risk (06/12/2022)  Alcohol Screen: Low Risk  (03/24/2021)  Depression (PHQ2-9): Low Risk  (08/26/2022)  Financial Resource Strain: Low Risk  (03/24/2021)  Physical Activity: Inactive (03/24/2021)  Social Connections: Moderately Isolated (03/24/2021)  Stress: No Stress Concern Present (03/24/2021)  Tobacco Use: Low Risk  (10/14/2022)    Readmission Risk Interventions    06/12/2022   11:19 AM 05/20/2022    4:35 PM  Readmission Risk Prevention Plan  Transportation Screening Complete Complete  PCP or Specialist Appt within 3-5 Days Complete Complete  HRI or Home Care Consult Complete   Social Work Consult for Recovery Care Planning/Counseling Complete Complete  Palliative Care Screening Not Applicable Not Applicable  Medication Review Oceanographer) Complete Complete

## 2022-10-16 NOTE — Consult Note (Signed)
   [facility] Inpatient Consult   10/16/2022  Brandy Gonzales 10-12-1940 [id number]  [facility] [[facility]]  Accountable Care Organization [ACO] Patient: Medicare ACO REACH  Primary Care Provider:  Tresa Garter, MD with [place] at [facility] which is listed to provide the transition of care follow up.   Patient screened for 4 hospitalizations in 6 months with noted high risk score for unplanned readmission risk to assess for potential [facility]  [[facility]] Care Management service needs for post hospital transition for care coordination.  Review of patient's electronic medical record reveals patient is from home.  3:10 pm Met with patient daughter family member at the bedside, patient up in recliner from PT session.  Explained post hospital follow up for care coordination and support needs.  Gave the daughter a 24 hour nurse advise line magnet and an appointment reminder card for post hospital follow up. They were accepting of information and gracious.   Plan:  Continue to follow progress and disposition to assess for post hospital community care coordination/management needs.  Referral request for community care coordination: current disposition is unknown.  Of note, [facility] Care Management/Population Health does not replace or interfere with any arrangements made by the Inpatient Transition of Care team.  For questions contact:   Charlesetta Shanks, RN BSN CCM [facility]  [phone] business mobile phone Toll free office [phone]  *Concierge Line  [phone] Fax number: [phone] [email or web address]

## 2022-10-16 NOTE — Progress Notes (Signed)
PROGRESS NOTE    Brandy DEJOSEPH  Gonzales:[id number] DOB: 1940-09-29 DOA: 10/14/2022 PCP: Tresa Garter, MD   Brief Narrative: Brandy Gonzales is a 82 y.o. female with a history of diabetes mellitus type 2, hypertension, CKD stage IIIb.  Patient presented secondary to syncopal episode.  Patient admitted to telemetry for syncopal workup.  Patient also noted to have possible UTI and empiric ceftriaxone started.  Additionally, patient also noted to have worsening of her left foot mass and podiatry is consulted for evaluation recommendations.   Assessment and Plan:  Syncope Unclear etiology but concerning for possible vasovagal episode in setting of presumed illness from UTI and AKI.  Patient started on telemetry for observation.  No evidence of AV stenosis. No decreased LVEF noted. -PT/OT -Continue telemetry to rule out arrhythmia  UTI Presumed diagnosis. Urinalysis significant for positive nitrites and leukocytes with few bacteria. Urine culture obtained and empiric Ceftriaxone started. GNR on culture -Continue Ceftriaxone -Follow-up culture sensitivities  Left foot mass Present on dorsum of left foot.  Slightly fluctuant, tender mass. Noted as an outpatient at podiatrist office.  Patient was managed on doxycycline for possible infection.  Podiatry consulted on admission and will see while inpatient.  AKI on CKD stage 3b Diabetic nephropathy Baseline creatinine of about 1.4-1.6.  Creatinine of 2.22 on admission.  IV fluids started.  Improvement of creatinine to 1.74 this morning near baseline.  History of Bell's Palsy Noted.  In the past, secondary to antibiotic use.  History of DVT Patient is on Xarelto as an outpatient.  History of IVC filter placement.  Plan was to transition to aspirin and discontinue Xarelto.  Xarelto held on admission. -Continue aspirin  Primary hypertension -Continue Coreg 25 mg twice daily  Diabetes mellitus type 2 Controlled for age based on most recent  hemoglobin A1c of 7.8%.  Patient is managed on Farxiga and glimepiride as an outpatient.  May need to consider discontinuing Marcelline Deist if patient develops recurrent UTIs.   DVT prophylaxis: Lovenox Code Status:   Code Status: Full Code Family Communication: None at bedside Disposition Plan: Discharge home likely in 1 day pending transition to outpatient antibiotics   Consultants:  Podiatry  Procedures:  None  Antimicrobials: Ceftriaxone    Subjective: No concerns this morning.  Objective: BP (!) 148/49 (BP Location: Right Arm)   Pulse 61   Temp (!) 97.5 F (36.4 C) (Oral)   Resp 16   Ht 4\' 11"  (1.499 m)   Wt 49.9 kg   SpO2 100%   BMI 22.22 kg/m   Examination:  General exam: Appears calm and comfortable Respiratory system: Clear to auscultation. Respiratory effort normal. Cardiovascular system: S1 & S2 heard, RRR. Gastrointestinal system: Abdomen is nondistended, soft and nontender. Normal bowel sounds heard. Central nervous system: Alert and oriented. No focal neurological deficits. Psychiatry: Judgement and insight appear normal. Mood & affect appropriate.    Data Reviewed: I have personally reviewed following labs and imaging studies  CBC Lab Results  Component Value Date   WBC 10.9 (H) 10/15/2022   RBC 4.15 10/15/2022   HGB 12.4 10/15/2022   HCT 36.2 10/15/2022   MCV 87.2 10/15/2022   MCH 29.9 10/15/2022   PLT 164 10/15/2022   MCHC 34.3 10/15/2022   RDW 14.8 10/15/2022   LYMPHSABS 2.3 08/07/2022   MONOABS 0.4 08/07/2022   EOSABS 0.1 08/07/2022   BASOSABS 0.0 08/07/2022     Last metabolic panel Lab Results  Component Value Date   NA 140 10/16/2022  K 4.8 10/16/2022   CL 107 10/16/2022   CO2 19 (L) 10/16/2022   BUN 39 (H) 10/16/2022   CREATININE 1.74 (H) 10/16/2022   GLUCOSE 105 (H) 10/16/2022   GFRNONAA 29 (L) 10/16/2022   GFRAA 33 (L) 05/03/2019   CALCIUM 9.4 10/16/2022   PHOS 3.3 04/28/2022   PROT 6.4 (L) 08/07/2022   ALBUMIN 3.0 (L)  08/07/2022   BILITOT 0.7 08/07/2022   ALKPHOS 153 (H) 08/07/2022   AST 38 08/07/2022   ALT 43 08/07/2022   ANIONGAP 14 10/16/2022    GFR: Estimated Creatinine Clearance: 17 mL/min (A) (by C-G formula based on SCr of 1.74 mg/dL (H)).  Recent Results (from the past 240 hour(s))  Resp panel by RT-PCR (RSV, Flu A&B, Covid) Anterior Nasal Swab     Status: None   Collection Time: 10/14/22  5:34 PM   Specimen: Anterior Nasal Swab  Result Value Ref Range Status   SARS Coronavirus 2 by RT PCR NEGATIVE NEGATIVE Final   Influenza A by PCR NEGATIVE NEGATIVE Final   Influenza B by PCR NEGATIVE NEGATIVE Final    Comment: (NOTE) The Xpert Xpress SARS-CoV-2/FLU/RSV plus assay is intended as an aid in the diagnosis of influenza from Nasopharyngeal swab specimens and should not be used as a sole basis for treatment. Nasal washings and aspirates are unacceptable for Xpert Xpress SARS-CoV-2/FLU/RSV testing.  Fact Sheet for Patients: BloggerCourse.com  Fact Sheet for Healthcare Providers: SeriousBroker.it  This test is not yet approved or cleared by the [place] FDA and has been authorized for detection and/or diagnosis of SARS-CoV-2 by FDA under an Emergency Use Authorization (EUA). This EUA will remain in effect (meaning this test can be used) for the duration of the COVID-19 declaration under Section 564(b)(1) of the Act, 21 U.S.C. section 360bbb-3(b)(1), unless the authorization is terminated or revoked.     Resp Syncytial Virus by PCR NEGATIVE NEGATIVE Final    Comment: (NOTE) Fact Sheet for Patients: BloggerCourse.com  Fact Sheet for Healthcare Providers: SeriousBroker.it  This test is not yet approved or cleared by the [place] FDA and has been authorized for detection and/or diagnosis of SARS-CoV-2 by FDA under an Emergency Use Authorization (EUA). This EUA will remain in  effect (meaning this test can be used) for the duration of the COVID-19 declaration under Section 564(b)(1) of the Act, 21 U.S.C. section 360bbb-3(b)(1), unless the authorization is terminated or revoked.  Performed at [facility], [address] [place], [place] [address]   Urine Culture (for pregnant, neutropenic or urologic patients or patients with an indwelling urinary catheter)     Status: Abnormal (Preliminary result)   Collection Time: 10/14/22  9:12 PM   Specimen: Urine, Catheterized  Result Value Ref Range Status   Specimen Description URINE, CATHETERIZED  Final   Special Requests NONE  Final   Culture (A)  Final    5,000 COLONIES/mL GRAM NEGATIVE RODS CULTURE REINCUBATED FOR BETTER GROWTH SUSCEPTIBILITIES TO FOLLOW Performed at [facility], [address], [place], [place] [address]    Report Status PENDING  Incomplete  MRSA Next Gen by PCR, Nasal     Status: None   Collection Time: 10/15/22  4:37 AM   Specimen: Nasal Mucosa; Nasal Swab  Result Value Ref Range Status   MRSA by PCR Next Gen NOT DETECTED NOT DETECTED Final    Comment: (NOTE) The GeneXpert MRSA Assay (FDA approved for NASAL specimens only), is one component of a comprehensive MRSA colonization surveillance  program. It is not intended to diagnose MRSA infection nor to guide or monitor treatment for MRSA infections. Test performance is not FDA approved in patients less than 49 years old. Performed at [facility], [address], [place], [place] [address]       Radiology Studies: ECHOCARDIOGRAM COMPLETE  Result Date: 10/15/2022    ECHOCARDIOGRAM REPORT   Patient Name:   HARLEYQUINN ECKELKAMP Date of Exam: 10/15/2022 Medical Rec #:  [id number]    Height:       59.0 in Accession #:    [id number]   Weight:       110.0 lb Date of Birth:  [birth year]    BSA:          1.431 m Patient Age:    81 years     BP:           154/64 mmHg Patient Gender: F            HR:           63 bpm. Exam Location:   Inpatient Procedure: 2D Echo, Cardiac Doppler, Color Doppler and Intracardiac            Opacification Agent Indications:    Syncope  History:        Patient has prior history of Echocardiogram examinations, most                 recent 10/04/2012. Risk Factors:Diabetes and Hypertension. CKD.  Sonographer:    Milda Smart Referring Phys: [phone] JARED M GARDNER  Sonographer Comments: Image acquisition challenging due to respiratory motion. IMPRESSIONS  1. Small hyperdynamic LV cavity. Left ventricular ejection fraction, by estimation, is 65 to 70%. The left ventricle has normal function. The left ventricle has no regional wall motion abnormalities. There is mild left ventricular hypertrophy. Left ventricular diastolic parameters are consistent with Grade I diastolic dysfunction (impaired relaxation). Elevated left ventricular end-diastolic pressure.  2. Right ventricular systolic function is normal. The right ventricular size is normal.  3. The mitral valve is degenerative. Trivial mitral valve regurgitation. No evidence of mitral stenosis.  4. Calcified non coronary cusp. The aortic valve is tricuspid. There is moderate calcification of the aortic valve. There is moderate thickening of the aortic valve. Aortic valve regurgitation is not visualized. Aortic valve sclerosis is present, with no evidence of aortic valve stenosis.  5. The inferior vena cava is normal in size with greater than 50% respiratory variability, suggesting right atrial pressure of 3 mmHg. FINDINGS  Left Ventricle: Small hyperdynamic LV cavity. Left ventricular ejection fraction, by estimation, is 65 to 70%. The left ventricle has normal function. The left ventricle has no regional wall motion abnormalities. Definity contrast agent was given IV to delineate the left ventricular endocardial borders. The left ventricular internal cavity size was small. There is mild left ventricular hypertrophy. Left ventricular diastolic parameters are consistent  with Grade I diastolic dysfunction (impaired relaxation). Elevated left ventricular end-diastolic pressure. Right Ventricle: The right ventricular size is normal. No increase in right ventricular wall thickness. Right ventricular systolic function is normal. Left Atrium: Left atrial size was normal in size. Right Atrium: Right atrial size was normal in size. Pericardium: There is no evidence of pericardial effusion. Mitral Valve: The mitral valve is degenerative in appearance. There is mild thickening of the mitral valve leaflet(s). There is mild calcification of the mitral valve leaflet(s). Mild mitral annular calcification. Trivial mitral valve regurgitation. No evidence of mitral valve stenosis. MV peak gradient,  7.1 mmHg. The mean mitral valve gradient is 2.0 mmHg. Tricuspid Valve: The tricuspid valve is normal in structure. Tricuspid valve regurgitation is not demonstrated. No evidence of tricuspid stenosis. Aortic Valve: Calcified non coronary cusp. The aortic valve is tricuspid. There is moderate calcification of the aortic valve. There is moderate thickening of the aortic valve. Aortic valve regurgitation is not visualized. Aortic valve sclerosis is present, with no evidence of aortic valve stenosis. Pulmonic Valve: The pulmonic valve was normal in structure. Pulmonic valve regurgitation is not visualized. No evidence of pulmonic stenosis. Aorta: The aortic root is normal in size and structure. Venous: The inferior vena cava is normal in size with greater than 50% respiratory variability, suggesting right atrial pressure of 3 mmHg. IAS/Shunts: No atrial level shunt detected by color flow Doppler.  LEFT VENTRICLE PLAX 2D LVIDd:         3.20 cm   Diastology LVIDs:         1.80 cm   LV e' medial:    2.82 cm/s LV PW:         1.20 cm   LV E/e' medial:  29.3 LV IVS:        1.20 cm   LV e' lateral:   2.68 cm/s LVOT diam:     1.70 cm   LV E/e' lateral: 30.8 LV SV:         55 LV SV Index:   39 LVOT Area:     2.27 cm   RIGHT VENTRICLE            IVC RV S prime:     9.02 cm/s  IVC diam: 0.80 cm TAPSE (M-mode): 1.8 cm LEFT ATRIUM             Index        RIGHT ATRIUM          Index LA diam:        3.10 cm 2.17 cm/m   RA Area:     7.32 cm LA Vol (A2C):   26.6 ml 18.59 ml/m  RA Volume:   10.90 ml 7.62 ml/m LA Vol (A4C):   15.4 ml 10.76 ml/m LA Biplane Vol: 21.2 ml 14.82 ml/m  AORTIC VALVE LVOT Vmax:   118.00 cm/s LVOT Vmean:  80.900 cm/s LVOT VTI:    0.244 m  AORTA Ao Root diam: 2.80 cm Ao Asc diam:  2.90 cm MITRAL VALVE                TRICUSPID VALVE MV Area (PHT): 2.37 cm     TR Peak grad:   14.4 mmHg MV Area VTI:   1.32 cm     TR Vmax:        190.00 cm/s MV Peak grad:  7.1 mmHg MV Mean grad:  2.0 mmHg     SHUNTS MV Vmax:       1.33 m/s     Systemic VTI:  0.24 m MV Vmean:      72.3 cm/s    Systemic Diam: 1.70 cm MV Decel Time: 320 msec MV E velocity: 82.60 cm/s MV A velocity: 142.00 cm/s MV E/A ratio:  0.58 Charlton Haws MD Electronically signed by Charlton Haws MD Signature Date/Time: 10/15/2022/1:22:44 PM    Final    CT Head Wo Contrast  Result Date: 10/14/2022 CLINICAL DATA:  Syncopal episode EXAM: CT HEAD WITHOUT CONTRAST TECHNIQUE: Contiguous axial images were obtained from the base of the skull through the vertex without intravenous contrast. RADIATION  DOSE REDUCTION: This exam was performed according to the departmental dose-optimization program which includes automated exposure control, adjustment of the mA and/or kV according to patient size and/or use of iterative reconstruction technique. COMPARISON:  04/27/2022 FINDINGS: Brain: Generalized brain volume loss. Extensive chronic small-vessel ischemic changes affecting the cerebral hemispheric deep and subcortical white matter. No large vessel territory stroke. No mass, hemorrhage, hydrocephalus or extra-axial collection. Vascular: There is atherosclerotic calcification of the major vessels at the base of the brain. Skull: Negative Sinuses/Orbits: Clear/normal Other:  None IMPRESSION: No acute CT finding. Generalized brain volume loss. Extensive chronic small-vessel ischemic changes of the cerebral hemispheric deep and subcortical white matter. Electronically Signed   By: Paulina Fusi M.D.   On: 10/14/2022 18:05   DG Chest 1 View  Result Date: 10/14/2022 CLINICAL DATA:  Syncope EXAM: CHEST  1 VIEW COMPARISON:  X-ray 08/07/2022 FINDINGS: Stable cardiopericardial silhouette. No consolidation, pneumothorax, effusion or edema. Underinflation. Slight curvature of the spine with degenerative changes. Apical pleural thickening. Overlapping cardiac leads. Osteopenia. Stable areas sclerosis along the left humeral shaft. IMPRESSION: No acute cardiopulmonary disease.  Underinflated x-ray Electronically Signed   By: Karen Kays M.D.   On: 10/14/2022 17:48      LOS: 1 day    Jacquelin Hawking, MD [facility] 10/16/2022, 10:22 AM   If 7PM-7AM, please contact night-coverage [email or web address]

## 2022-10-17 DIAGNOSIS — N179 Acute kidney failure, unspecified: Secondary | ICD-10-CM | POA: Diagnosis not present

## 2022-10-17 LAB — GLUCOSE, CAPILLARY
Glucose-Capillary: 99 mg/dL (ref 70–99)
Glucose-Capillary: 190 mg/dL — ABNORMAL HIGH (ref 70–99)

## 2022-10-17 LAB — URINE CULTURE: Culture: 4000 — AB

## 2022-10-17 MED ORDER — FLUCONAZOLE 100 MG PO TABS: 100.0000 mg | ORAL_TABLET | Freq: Every day | ORAL | 0 refills | Status: AC

## 2022-10-17 NOTE — Discharge Instructions (Addendum)
Brandy Gonzales,  You are in the hospital because of a passing out episode.  This appears to be related to dehydration.  You were treated with IV fluids with significant improvement in your kidney function and ability to ambulate.  While you are here, you are also found to have a urinary tract infection which included a bacteria and fungus.  I recommend that you stop your Marcelline Deist, which could be contributing to these issues.  Please continue your antifungal medication, fluconazole, as prescribed.  Please follow-up with your primary care physician.

## 2022-10-17 NOTE — Discharge Summary (Signed)
Physician Discharge Summary   Patient: Brandy Gonzales MRN: [id number] DOB: Jun 04, 1940  Admit date:     10/14/2022  Discharge date: 10/17/22  Discharge Physician: Jacquelin Hawking, MD   PCP: Tresa Garter, MD   Recommendations at discharge:  PCP visit for hospital follow-up  Discharge Diagnoses: Principal Problem:   AKI (acute kidney injury) [facility]) Active Problems:   Syncope   Swelling of left foot   UTI (urinary tract infection)   Diabetic nephropathy (HCC)   Uncontrolled type 2 diabetes mellitus with hyperglycemia, without long-term current use of insulin (HCC)   Essential hypertension   Current long-term use of anticoagulant medication with history of deep venous thrombosis 04/2022 (DVT)   History of insertion of IVC (inferior vena caval) filter 2006   History of Bell's palsy   Hematoma of left foot  Resolved Problems:   * No resolved hospital problems. [facility] Course: Brandy Gonzales is a 82 y.o. female with a history of diabetes mellitus type 2, hypertension, CKD stage IIIb.  Patient presented secondary to syncopal episode.  Patient admitted to telemetry for syncopal workup.  Patient also noted to have possible UTI and empiric ceftriaxone started.  Additionally, patient also noted to have worsening of her left foot mass and podiatry is consulted for evaluation recommendations.  Assessment and Plan:  Syncope Unclear etiology but concerning for possible vasovagal episode in setting of presumed illness from UTI and AKI.  Patient started on telemetry for observation.  No evidence of AV stenosis. No decreased LVEF noted. PT/OT recommending home health services.   UTI Associated dysuria.  Urinalysis suggestive of UTI.  Urinalysis significant for positive nitrites and leukocytes with few bacteria. Urine culture obtained and empiric Ceftriaxone started.  Urine culture significant for 4000 colonies per milliliter of E. coli.  Patient completed 3-day course of ceftriaxone.   Left  foot mass Present on dorsum of left foot.  Slightly fluctuant, tender mass. Noted as an outpatient at podiatrist office.  Patient was managed on doxycycline for possible infection.  Podiatry consulted on admission and will see while inpatient.   AKI on CKD stage 3b Diabetic nephropathy Baseline creatinine of about 1.4-1.6.  Creatinine of 2.22 on admission.  IV fluids started.  Improvement of creatinine to 1.74 this morning near baseline.   History of Bell's Palsy Noted.  In the past, secondary to antibiotic use.   History of DVT Patient is on Xarelto as an outpatient.  History of IVC filter placement.  Plan was to transition to aspirin and discontinue Xarelto.  Xarelto held on admission and discontinued on discharge. Continue aspirin.   Primary hypertension Continue Coreg 25 mg twice daily.   Diabetes mellitus type 2 Controlled for age based on most recent hemoglobin A1c of 7.8%.  Patient is managed on Farxiga and glimepiride as an outpatient. Farxiga discontinued on discharge secondary to UTI. Follow-up with PCP   Consultants: None Procedures performed: None  Disposition: Home Diet recommendation: Carb modified diet   DISCHARGE MEDICATION: Allergies as of 10/17/2022       Reactions   Amlodipine Besylate Other (See Comments)   Hair loss   Atenolol Other (See Comments)   Fluid retention   Oxycodone Hcl Other (See Comments)   Dizziness   Propoxyphene N-acetaminophen Other (See Comments)   Unknown reaction   Enalapril Maleate Palpitations        Medication List     STOP taking these medications    dapagliflozin propanediol 10 MG Tabs tablet Commonly known  as: FARXIGA   doxycycline 100 MG tablet Commonly known as: VIBRA-TABS   rivaroxaban 20 MG Tabs tablet Commonly known as: XARELTO       TAKE these medications    acetaminophen 500 MG tablet Commonly known as: TYLENOL Take 500 mg by mouth at bedtime.   aspirin EC 81 MG tablet Take 81 mg by mouth daily.  Swallow whole.   carvedilol 25 MG tablet Commonly known as: COREG Take 1 tablet (25 mg total) by mouth 2 (two) times daily with a meal.   cyanocobalamin 1000 MCG tablet Take 1 tablet (1,000 mcg total) by mouth daily.   fluconazole 100 MG tablet Commonly known as: DIFLUCAN Take 1 tablet (100 mg total) by mouth daily for 5 days. Start taking on: October 18, 2022   folic acid 1 MG tablet Commonly known as: FOLVITE Take 1 tablet (1 mg total) by mouth daily.   glimepiride 2 MG tablet Commonly known as: AMARYL Take 1 tablet (2 mg total) by mouth daily before breakfast.   omeprazole 20 MG capsule Commonly known as: PRILOSEC Take 1 capsule (20 mg total) by mouth daily.   thiamine 100 MG tablet Commonly known as: Vitamin B-1 Take 1 tablet (100 mg total) by mouth daily. What changed: when to take this   torsemide 20 MG tablet Commonly known as: DEMADEX Take 20 mg by mouth 3 (three) times a week. 1 tablet M-W-F for edema   Vitamin D3 50 MCG (2000 UT) capsule Take 1 capsule (2,000 Units total) by mouth daily.        Follow-up Information     [facility] Follow up.   Why: Someone will call you to schedule first home visit. If you have not received a call after two days of discharging home, call their number listed. If no one comes to assess, call Case Manager at [phone]. Contact information: [address] [place] [address]  [phone]        [name], [name], MD. Schedule an appointment as soon as possible for a visit in 1 week(s).   Specialty: Internal Medicine Why: For hospital follow-up Contact information: [address] [place] [address] [phone]                Discharge Exam: BP (!) 185/74 (BP Location: Left Arm)   Pulse (!) 59   Temp 97.8 F (36.6 C) (Oral)   Resp 19   Ht 4\' 11"  (1.499 m)   Wt 49.9 kg   SpO2 100%   BMI 22.22 kg/m   General exam: Appears calm and comfortable Respiratory system:  Clear to auscultation. Respiratory effort normal. Cardiovascular system: S1 & S2 heard, RRR. Gastrointestinal system: Abdomen is nondistended, soft and nontender. Normal bowel sounds heard. Central nervous system: Alert and oriented. No focal neurological deficits. Musculoskeletal: No edema. No calf tenderness Skin: Dorsum of left foot with minimally fluctuant, non-tender and non-erythematous mass Psychiatry: Judgement and insight appear normal. Mood & affect appropriate.   Condition at discharge: stable  The results of significant diagnostics from this hospitalization (including imaging, microbiology, ancillary and laboratory) are listed below for reference.   Imaging Studies: ECHOCARDIOGRAM COMPLETE  Result Date: 10/15/2022    ECHOCARDIOGRAM REPORT   Patient Name:   HAYLEY KIERSTEAD Date of Exam: 10/15/2022 Medical Rec #:  [id number]    Height:       59.0 in Accession #:    [id number]   Weight:       110.0 lb  Date of Birth:  Sep 26, 1940    BSA:          1.431 m Patient Age:    8 years     BP:           154/64 mmHg Patient Gender: F            HR:           63 bpm. Exam Location:  Inpatient Procedure: 2D Echo, Cardiac Doppler, Color Doppler and Intracardiac            Opacification Agent Indications:    Syncope  History:        Patient has prior history of Echocardiogram examinations, most                 recent 10/04/2012. Risk Factors:Diabetes and Hypertension. CKD.  Sonographer:    Milda Smart Referring Phys: [phone] JARED M GARDNER  Sonographer Comments: Image acquisition challenging due to respiratory motion. IMPRESSIONS  1. Small hyperdynamic LV cavity. Left ventricular ejection fraction, by estimation, is 65 to 70%. The left ventricle has normal function. The left ventricle has no regional wall motion abnormalities. There is mild left ventricular hypertrophy. Left ventricular diastolic parameters are consistent with Grade I diastolic dysfunction (impaired relaxation). Elevated left ventricular  end-diastolic pressure.  2. Right ventricular systolic function is normal. The right ventricular size is normal.  3. The mitral valve is degenerative. Trivial mitral valve regurgitation. No evidence of mitral stenosis.  4. Calcified non coronary cusp. The aortic valve is tricuspid. There is moderate calcification of the aortic valve. There is moderate thickening of the aortic valve. Aortic valve regurgitation is not visualized. Aortic valve sclerosis is present, with no evidence of aortic valve stenosis.  5. The inferior vena cava is normal in size with greater than 50% respiratory variability, suggesting right atrial pressure of 3 mmHg. FINDINGS  Left Ventricle: Small hyperdynamic LV cavity. Left ventricular ejection fraction, by estimation, is 65 to 70%. The left ventricle has normal function. The left ventricle has no regional wall motion abnormalities. Definity contrast agent was given IV to delineate the left ventricular endocardial borders. The left ventricular internal cavity size was small. There is mild left ventricular hypertrophy. Left ventricular diastolic parameters are consistent with Grade I diastolic dysfunction (impaired relaxation). Elevated left ventricular end-diastolic pressure. Right Ventricle: The right ventricular size is normal. No increase in right ventricular wall thickness. Right ventricular systolic function is normal. Left Atrium: Left atrial size was normal in size. Right Atrium: Right atrial size was normal in size. Pericardium: There is no evidence of pericardial effusion. Mitral Valve: The mitral valve is degenerative in appearance. There is mild thickening of the mitral valve leaflet(s). There is mild calcification of the mitral valve leaflet(s). Mild mitral annular calcification. Trivial mitral valve regurgitation. No evidence of mitral valve stenosis. MV peak gradient, 7.1 mmHg. The mean mitral valve gradient is 2.0 mmHg. Tricuspid Valve: The tricuspid valve is normal in  structure. Tricuspid valve regurgitation is not demonstrated. No evidence of tricuspid stenosis. Aortic Valve: Calcified non coronary cusp. The aortic valve is tricuspid. There is moderate calcification of the aortic valve. There is moderate thickening of the aortic valve. Aortic valve regurgitation is not visualized. Aortic valve sclerosis is present, with no evidence of aortic valve stenosis. Pulmonic Valve: The pulmonic valve was normal in structure. Pulmonic valve regurgitation is not visualized. No evidence of pulmonic stenosis. Aorta: The aortic root is normal in size and structure. Venous: The inferior vena cava  is normal in size with greater than 50% respiratory variability, suggesting right atrial pressure of 3 mmHg. IAS/Shunts: No atrial level shunt detected by color flow Doppler.  LEFT VENTRICLE PLAX 2D LVIDd:         3.20 cm   Diastology LVIDs:         1.80 cm   LV e' medial:    2.82 cm/s LV PW:         1.20 cm   LV E/e' medial:  29.3 LV IVS:        1.20 cm   LV e' lateral:   2.68 cm/s LVOT diam:     1.70 cm   LV E/e' lateral: 30.8 LV SV:         55 LV SV Index:   39 LVOT Area:     2.27 cm  RIGHT VENTRICLE            IVC RV S prime:     9.02 cm/s  IVC diam: 0.80 cm TAPSE (M-mode): 1.8 cm LEFT ATRIUM             Index        RIGHT ATRIUM          Index LA diam:        3.10 cm 2.17 cm/m   RA Area:     7.32 cm LA Vol (A2C):   26.6 ml 18.59 ml/m  RA Volume:   10.90 ml 7.62 ml/m LA Vol (A4C):   15.4 ml 10.76 ml/m LA Biplane Vol: 21.2 ml 14.82 ml/m  AORTIC VALVE LVOT Vmax:   118.00 cm/s LVOT Vmean:  80.900 cm/s LVOT VTI:    0.244 m  AORTA Ao Root diam: 2.80 cm Ao Asc diam:  2.90 cm MITRAL VALVE                TRICUSPID VALVE MV Area (PHT): 2.37 cm     TR Peak grad:   14.4 mmHg MV Area VTI:   1.32 cm     TR Vmax:        190.00 cm/s MV Peak grad:  7.1 mmHg MV Mean grad:  2.0 mmHg     SHUNTS MV Vmax:       1.33 m/s     Systemic VTI:  0.24 m MV Vmean:      72.3 cm/s    Systemic Diam: 1.70 cm MV Decel  Time: 320 msec MV E velocity: 82.60 cm/s MV A velocity: 142.00 cm/s MV E/A ratio:  0.58 Charlton Haws MD Electronically signed by Charlton Haws MD Signature Date/Time: 10/15/2022/1:22:44 PM    Final    CT Head Wo Contrast  Result Date: 10/14/2022 CLINICAL DATA:  Syncopal episode EXAM: CT HEAD WITHOUT CONTRAST TECHNIQUE: Contiguous axial images were obtained from the base of the skull through the vertex without intravenous contrast. RADIATION DOSE REDUCTION: This exam was performed according to the departmental dose-optimization program which includes automated exposure control, adjustment of the mA and/or kV according to patient size and/or use of iterative reconstruction technique. COMPARISON:  04/27/2022 FINDINGS: Brain: Generalized brain volume loss. Extensive chronic small-vessel ischemic changes affecting the cerebral hemispheric deep and subcortical white matter. No large vessel territory stroke. No mass, hemorrhage, hydrocephalus or extra-axial collection. Vascular: There is atherosclerotic calcification of the major vessels at the base of the brain. Skull: Negative Sinuses/Orbits: Clear/normal Other: None IMPRESSION: No acute CT finding. Generalized brain volume loss. Extensive chronic small-vessel ischemic changes of the cerebral hemispheric deep and subcortical  white matter. Electronically Signed   By: Paulina Fusi M.D.   On: 10/14/2022 18:05   DG Chest 1 View  Result Date: 10/14/2022 CLINICAL DATA:  Syncope EXAM: CHEST  1 VIEW COMPARISON:  X-ray 08/07/2022 FINDINGS: Stable cardiopericardial silhouette. No consolidation, pneumothorax, effusion or edema. Underinflation. Slight curvature of the spine with degenerative changes. Apical pleural thickening. Overlapping cardiac leads. Osteopenia. Stable areas sclerosis along the left humeral shaft. IMPRESSION: No acute cardiopulmonary disease.  Underinflated x-ray Electronically Signed   By: Karen Kays M.D.   On: 10/14/2022 17:48   DG Foot Complete  Left  Result Date: 10/09/2022 Please see detailed radiograph report in office note.   Microbiology: Results for orders placed or performed during the hospital encounter of 10/14/22  Resp panel by RT-PCR (RSV, Flu A&B, Covid) Anterior Nasal Swab     Status: None   Collection Time: 10/14/22  5:34 PM   Specimen: Anterior Nasal Swab  Result Value Ref Range Status   SARS Coronavirus 2 by RT PCR NEGATIVE NEGATIVE Final   Influenza A by PCR NEGATIVE NEGATIVE Final   Influenza B by PCR NEGATIVE NEGATIVE Final    Comment: (NOTE) The Xpert Xpress SARS-CoV-2/FLU/RSV plus assay is intended as an aid in the diagnosis of influenza from Nasopharyngeal swab specimens and should not be used as a sole basis for treatment. Nasal washings and aspirates are unacceptable for Xpert Xpress SARS-CoV-2/FLU/RSV testing.  Fact Sheet for Patients: BloggerCourse.com  Fact Sheet for Healthcare Providers: SeriousBroker.it  This test is not yet approved or cleared by the [place] FDA and has been authorized for detection and/or diagnosis of SARS-CoV-2 by FDA under an Emergency Use Authorization (EUA). This EUA will remain in effect (meaning this test can be used) for the duration of the COVID-19 declaration under Section 564(b)(1) of the Act, 21 U.S.C. section 360bbb-3(b)(1), unless the authorization is terminated or revoked.     Resp Syncytial Virus by PCR NEGATIVE NEGATIVE Final    Comment: (NOTE) Fact Sheet for Patients: BloggerCourse.com  Fact Sheet for Healthcare Providers: SeriousBroker.it  This test is not yet approved or cleared by the [place] FDA and has been authorized for detection and/or diagnosis of SARS-CoV-2 by FDA under an Emergency Use Authorization (EUA). This EUA will remain in effect (meaning this test can be used) for the duration of the COVID-19 declaration under Section  564(b)(1) of the Act, 21 U.S.C. section 360bbb-3(b)(1), unless the authorization is terminated or revoked.  Performed at [facility], [address] [place], [place] [address]   Urine Culture (for pregnant, neutropenic or urologic patients or patients with an indwelling urinary catheter)     Status: Abnormal   Collection Time: 10/14/22  9:12 PM   Specimen: Urine, Catheterized  Result Value Ref Range Status   Specimen Description URINE, CATHETERIZED  Final   Special Requests   Final    NONE Performed at [facility], [address] [place], [place] [address]    Culture (A)  Final    4,000 COLONIES/mL ESCHERICHIA COLI >=100,000 COLONIES/mL YEAST    Report Status 10/17/2022 FINAL  Final   Organism ID, Bacteria ESCHERICHIA COLI (A)  Final      Susceptibility   Escherichia coli - MIC*    AMPICILLIN >=32 RESISTANT Resistant     CEFAZOLIN <=4 SENSITIVE Sensitive     CEFEPIME <=0.12 SENSITIVE Sensitive     CEFTRIAXONE <=0.25 SENSITIVE Sensitive     CIPROFLOXACIN <=0.25 SENSITIVE Sensitive  GENTAMICIN <=1 SENSITIVE Sensitive     IMIPENEM <=0.25 SENSITIVE Sensitive     NITROFURANTOIN <=16 SENSITIVE Sensitive     TRIMETH/SULFA >=320 RESISTANT Resistant     AMPICILLIN/SULBACTAM 16 INTERMEDIATE Intermediate     PIP/TAZO <=4 SENSITIVE Sensitive     * 4,000 COLONIES/mL ESCHERICHIA COLI  MRSA Next Gen by PCR, Nasal     Status: None   Collection Time: 10/15/22  4:37 AM   Specimen: Nasal Mucosa; Nasal Swab  Result Value Ref Range Status   MRSA by PCR Next Gen NOT DETECTED NOT DETECTED Final    Comment: (NOTE) The GeneXpert MRSA Assay (FDA approved for NASAL specimens only), is one component of a comprehensive MRSA colonization surveillance program. It is not intended to diagnose MRSA infection nor to guide or monitor treatment for MRSA infections. Test performance is not FDA approved in patients less than 86 years old. Performed at [facility], [address] [place], [place] [address]     Labs: CBC: Recent Labs  Lab 10/14/22 1545 10/15/22 0936  WBC 13.3* 10.9*  HGB 13.9 12.4  HCT 43.9 36.2  MCV 90.1 87.2  PLT 220 164   Basic Metabolic Panel: Recent Labs  Lab 10/14/22 1545 10/14/22 1631 10/15/22 0936 10/16/22 0412  NA 136  --  135 140  K 4.4  --  3.4* 4.8  CL 104  --  105 107  CO2 18*  --  20* 19*  GLUCOSE 248*  --  202* 105*  BUN 47*  --  49* 39*  CREATININE 2.22*  --  2.04* 1.74*  CALCIUM 10.3  --  9.4 9.4  MG  --  2.0  --   --      CBG: Recent Labs  Lab 10/16/22 1113 10/16/22 1620 10/16/22 2024 10/17/22 0732 10/17/22 1116  GLUCAP 152* 76 154* 99 190*    Discharge time spent: 35 minutes.  Signed: Jacquelin Hawking, MD [facility] 10/17/2022

## 2022-10-19 ENCOUNTER — Telehealth: Payer: Self-pay | Admitting: *Deleted

## 2022-10-19 NOTE — Transitions of Care (Post Inpatient/ED Visit) (Signed)
10/19/2022  Name: Brandy Gonzales MRN: [id number] DOB: Oct 19, 1940  Today's TOC FU Call Status: Today's TOC FU Call Status:: Successful TOC FU Call Competed TOC FU Call Complete Date: 10/19/22  Transition Care Management Follow-up Telephone Call Date of Discharge: 10/17/22 Discharge Facility: [facility]) Type of Discharge: Inpatient Admission Primary Inpatient Discharge Diagnosis:: acute kidney injury How have you been since you were released from the hospital?: Better Any questions or concerns?: No  Items Reviewed: Did you receive and understand the discharge instructions provided?: Yes Medications obtained,verified, and reconciled?: Yes (Medications Reviewed) Any new allergies since your discharge?: No Dietary orders reviewed?: No Do you have support at home?: Yes People in Home: other relative(s), child(ren), adult Name of Support/Comfort Primary Source: [name]  Medications Reviewed Today: Medications Reviewed Today     Reviewed by Luella Cook, RN (Case Manager) on 10/19/22 at 1453  Med List Status: <None>   Medication Order Taking? Sig Documenting Provider Last Dose Status Informant  acetaminophen (TYLENOL) 500 MG tablet [id number] Yes Take 500 mg by mouth at bedtime. [provider] Taking Active Child  aspirin EC 81 MG tablet [id number] Yes Take 81 mg by mouth daily. Swallow whole. [provider] Taking Active Child           Med Note Ripley Fraise, AUDWIN   Wed Oct 14, 2022 10:33 PM) Start tomorrow after ending xarelto  carvedilol (COREG) 25 MG tablet [id number] Yes Take 1 tablet (25 mg total) by mouth 2 (two) times daily with a meal. [name], [name], MD Taking Active Child           Med Note Ripley Fraise, AUDWIN   Wed Oct 14, 2022 10:27 PM)    Cholecalciferol (VITAMIN D3) 50 MCG (2000 UT) capsule [id number] Yes Take 1 capsule (2,000 Units total) by mouth daily. [name], [name], MD Taking Active Child  cyanocobalamin 1000 MCG tablet [id number] Yes  Take 1 tablet (1,000 mcg total) by mouth daily. [place], Alvira Philips, DO Taking Active Child  fluconazole (DIFLUCAN) 100 MG tablet [id number] Yes Take 1 tablet (100 mg total) by mouth daily for 5 days. [name], MD Taking Active   folic acid (FOLVITE) 1 MG tablet [id number] Yes Take 1 tablet (1 mg total) by mouth daily. [place], Alvira Philips, DO Taking Active Child  glimepiride (AMARYL) 2 MG tablet [id number] Yes Take 1 tablet (2 mg total) by mouth daily before breakfast. [name], [name], MD Taking Active Child  omeprazole (PRILOSEC) 20 MG capsule [id number] Yes Take 1 capsule (20 mg total) by mouth daily. [name], [name], MD Taking Active Child  thiamine (VITAMIN B-1) 100 MG tablet [id number] Yes Take 1 tablet (100 mg total) by mouth daily.  Patient taking differently: Take 100 mg by mouth at bedtime.   [place], Alvira Philips, DO Taking Active Child  torsemide (DEMADEX) 20 MG tablet [id number] Yes Take 20 mg by mouth 3 (three) times a week. 1 tablet M-W-F for edema [provider] Taking Active Child            Home Care and Equipment/Supplies: Were Home Health Services Ordered?: Yes Name of Home Health Agency:: [facility] Has Agency set up a time to come to your home?: Yes First Home Health Visit Date: 10/20/22 Any new equipment or medical supplies ordered?: No  Functional Questionnaire: Do you need assistance with bathing/showering or dressing?: Yes Do you need assistance with meal preparation?: Yes Do you need assistance with eating?: No Do you have difficulty maintaining continence: No  Do you need assistance with getting out of bed/getting out of a chair/moving?: No Do you have difficulty managing or taking your medications?: No  Follow up appointments reviewed: PCP Follow-up appointment confirmed?: No MD Provider Line Number:[phone] Given: Yes (Daughter will call and make appt around her schedule of wk) Specialist Hospital Follow-up appointment confirmed?: NA Do you need  transportation to your follow-up appointment?: No Do you understand care options if your condition(s) worsen?: Yes-patient verbalized understanding  SDOH Interventions Today    Flowsheet Row Most Recent Value  SDOH Interventions   Food Insecurity Interventions Intervention Not Indicated  Housing Interventions Intervention Not Indicated  Transportation Interventions Intervention Not Indicated      Interventions Today    Flowsheet Row Most Recent Value  General Interventions   General Interventions Discussed/Reviewed General Interventions Discussed, General Interventions Reviewed, Doctor Visits  Doctor Visits Discussed/Reviewed Doctor Visits Discussed  Pharmacy Interventions   Pharmacy Dicussed/Reviewed Pharmacy Topics Discussed      TOC Interventions Today    Flowsheet Row Most Recent Value  TOC Interventions   TOC Interventions Discussed/Reviewed TOC Interventions Discussed, TOC Interventions Reviewed        Gean Maidens BSN RN [facility] [phone]

## 2022-10-20 DIAGNOSIS — N390 Urinary tract infection, site not specified: Secondary | ICD-10-CM | POA: Diagnosis not present

## 2022-10-20 DIAGNOSIS — I129 Hypertensive chronic kidney disease with stage 1 through stage 4 chronic kidney disease, or unspecified chronic kidney disease: Secondary | ICD-10-CM | POA: Diagnosis not present

## 2022-10-20 DIAGNOSIS — R413 Other amnesia: Secondary | ICD-10-CM | POA: Diagnosis not present

## 2022-10-20 DIAGNOSIS — Z7984 Long term (current) use of oral hypoglycemic drugs: Secondary | ICD-10-CM | POA: Diagnosis not present

## 2022-10-20 DIAGNOSIS — S9032XD Contusion of left foot, subsequent encounter: Secondary | ICD-10-CM | POA: Diagnosis not present

## 2022-10-20 DIAGNOSIS — Z86718 Personal history of other venous thrombosis and embolism: Secondary | ICD-10-CM | POA: Diagnosis not present

## 2022-10-20 DIAGNOSIS — E1121 Type 2 diabetes mellitus with diabetic nephropathy: Secondary | ICD-10-CM | POA: Diagnosis not present

## 2022-10-20 DIAGNOSIS — N179 Acute kidney failure, unspecified: Secondary | ICD-10-CM | POA: Diagnosis not present

## 2022-10-20 DIAGNOSIS — M1990 Unspecified osteoarthritis, unspecified site: Secondary | ICD-10-CM | POA: Diagnosis not present

## 2022-10-20 DIAGNOSIS — E43 Unspecified severe protein-calorie malnutrition: Secondary | ICD-10-CM | POA: Diagnosis not present

## 2022-10-20 DIAGNOSIS — B9620 Unspecified Escherichia coli [E. coli] as the cause of diseases classified elsewhere: Secondary | ICD-10-CM | POA: Diagnosis not present

## 2022-10-20 DIAGNOSIS — R55 Syncope and collapse: Secondary | ICD-10-CM | POA: Diagnosis not present

## 2022-10-20 DIAGNOSIS — Z7982 Long term (current) use of aspirin: Secondary | ICD-10-CM | POA: Diagnosis not present

## 2022-10-20 DIAGNOSIS — E1165 Type 2 diabetes mellitus with hyperglycemia: Secondary | ICD-10-CM | POA: Diagnosis not present

## 2022-10-20 DIAGNOSIS — F05 Delirium due to known physiological condition: Secondary | ICD-10-CM | POA: Diagnosis not present

## 2022-10-20 DIAGNOSIS — N1832 Chronic kidney disease, stage 3b: Secondary | ICD-10-CM | POA: Diagnosis not present

## 2022-10-21 ENCOUNTER — Ambulatory Visit (INDEPENDENT_AMBULATORY_CARE_PROVIDER_SITE_OTHER): Payer: Medicare Other | Admitting: Podiatry

## 2022-10-21 DIAGNOSIS — S9032XA Contusion of left foot, initial encounter: Secondary | ICD-10-CM

## 2022-10-21 DIAGNOSIS — T148XXA Other injury of unspecified body region, initial encounter: Secondary | ICD-10-CM | POA: Diagnosis not present
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
## 2022-10-23 ENCOUNTER — Telehealth: Payer: Self-pay | Admitting: Internal Medicine

## 2022-10-23 DIAGNOSIS — N390 Urinary tract infection, site not specified: Secondary | ICD-10-CM | POA: Diagnosis not present

## 2022-10-23 DIAGNOSIS — E1121 Type 2 diabetes mellitus with diabetic nephropathy: Secondary | ICD-10-CM | POA: Diagnosis not present

## 2022-10-23 DIAGNOSIS — E1165 Type 2 diabetes mellitus with hyperglycemia: Secondary | ICD-10-CM | POA: Diagnosis not present

## 2022-10-23 DIAGNOSIS — N179 Acute kidney failure, unspecified: Secondary | ICD-10-CM | POA: Diagnosis not present

## 2022-10-23 DIAGNOSIS — R55 Syncope and collapse: Secondary | ICD-10-CM | POA: Diagnosis not present

## 2022-10-23 DIAGNOSIS — B9620 Unspecified Escherichia coli [E. coli] as the cause of diseases classified elsewhere: Secondary | ICD-10-CM | POA: Diagnosis not present

## 2022-10-23 NOTE — Telephone Encounter (Signed)
Brandy Gonzales - [facility]  Patient is declining physical therapy

## 2022-10-27 DIAGNOSIS — E1165 Type 2 diabetes mellitus with hyperglycemia: Secondary | ICD-10-CM | POA: Diagnosis not present

## 2022-10-27 DIAGNOSIS — B9620 Unspecified Escherichia coli [E. coli] as the cause of diseases classified elsewhere: Secondary | ICD-10-CM | POA: Diagnosis not present

## 2022-10-27 DIAGNOSIS — N390 Urinary tract infection, site not specified: Secondary | ICD-10-CM | POA: Diagnosis not present

## 2022-10-27 DIAGNOSIS — E1121 Type 2 diabetes mellitus with diabetic nephropathy: Secondary | ICD-10-CM | POA: Diagnosis not present

## 2022-10-27 DIAGNOSIS — N179 Acute kidney failure, unspecified: Secondary | ICD-10-CM | POA: Diagnosis not present

## 2022-10-27 DIAGNOSIS — R55 Syncope and collapse: Secondary | ICD-10-CM | POA: Diagnosis not present

## 2022-11-03 DIAGNOSIS — N390 Urinary tract infection, site not specified: Secondary | ICD-10-CM | POA: Diagnosis not present

## 2022-11-03 DIAGNOSIS — N179 Acute kidney failure, unspecified: Secondary | ICD-10-CM | POA: Diagnosis not present

## 2022-11-03 DIAGNOSIS — B9620 Unspecified Escherichia coli [E. coli] as the cause of diseases classified elsewhere: Secondary | ICD-10-CM | POA: Diagnosis not present

## 2022-11-03 DIAGNOSIS — R55 Syncope and collapse: Secondary | ICD-10-CM | POA: Diagnosis not present

## 2022-11-03 DIAGNOSIS — E1165 Type 2 diabetes mellitus with hyperglycemia: Secondary | ICD-10-CM | POA: Diagnosis not present

## 2022-11-03 DIAGNOSIS — E1121 Type 2 diabetes mellitus with diabetic nephropathy: Secondary | ICD-10-CM | POA: Diagnosis not present

## 2022-11-10 ENCOUNTER — Encounter: Payer: Self-pay | Admitting: Internal Medicine

## 2022-11-10 DIAGNOSIS — R55 Syncope and collapse: Secondary | ICD-10-CM | POA: Diagnosis not present

## 2022-11-10 DIAGNOSIS — E1165 Type 2 diabetes mellitus with hyperglycemia: Secondary | ICD-10-CM | POA: Diagnosis not present

## 2022-11-10 DIAGNOSIS — E1121 Type 2 diabetes mellitus with diabetic nephropathy: Secondary | ICD-10-CM | POA: Diagnosis not present

## 2022-11-10 DIAGNOSIS — N390 Urinary tract infection, site not specified: Secondary | ICD-10-CM | POA: Diagnosis not present

## 2022-11-10 DIAGNOSIS — N179 Acute kidney failure, unspecified: Secondary | ICD-10-CM | POA: Diagnosis not present

## 2022-11-10 DIAGNOSIS — B9620 Unspecified Escherichia coli [E. coli] as the cause of diseases classified elsewhere: Secondary | ICD-10-CM | POA: Diagnosis not present

## 2022-11-10 NOTE — Telephone Encounter (Signed)
Completed form haas been placed on MD desk for signature.Marland KitchenRaechel Chute

## 2022-11-17 DIAGNOSIS — E1165 Type 2 diabetes mellitus with hyperglycemia: Secondary | ICD-10-CM | POA: Diagnosis not present

## 2022-11-17 DIAGNOSIS — N179 Acute kidney failure, unspecified: Secondary | ICD-10-CM | POA: Diagnosis not present

## 2022-11-17 DIAGNOSIS — B9620 Unspecified Escherichia coli [E. coli] as the cause of diseases classified elsewhere: Secondary | ICD-10-CM | POA: Diagnosis not present

## 2022-11-17 DIAGNOSIS — E1121 Type 2 diabetes mellitus with diabetic nephropathy: Secondary | ICD-10-CM | POA: Diagnosis not present

## 2022-11-17 DIAGNOSIS — N390 Urinary tract infection, site not specified: Secondary | ICD-10-CM | POA: Diagnosis not present

## 2022-11-17 DIAGNOSIS — R55 Syncope and collapse: Secondary | ICD-10-CM | POA: Diagnosis not present

## 2022-11-19 DIAGNOSIS — F05 Delirium due to known physiological condition: Secondary | ICD-10-CM | POA: Diagnosis not present

## 2022-11-19 DIAGNOSIS — E1121 Type 2 diabetes mellitus with diabetic nephropathy: Secondary | ICD-10-CM | POA: Diagnosis not present

## 2022-11-19 DIAGNOSIS — N179 Acute kidney failure, unspecified: Secondary | ICD-10-CM | POA: Diagnosis not present

## 2022-11-19 DIAGNOSIS — Z86718 Personal history of other venous thrombosis and embolism: Secondary | ICD-10-CM | POA: Diagnosis not present

## 2022-11-19 DIAGNOSIS — E1165 Type 2 diabetes mellitus with hyperglycemia: Secondary | ICD-10-CM | POA: Diagnosis not present

## 2022-11-19 DIAGNOSIS — N390 Urinary tract infection, site not specified: Secondary | ICD-10-CM | POA: Diagnosis not present

## 2022-11-19 DIAGNOSIS — M1990 Unspecified osteoarthritis, unspecified site: Secondary | ICD-10-CM | POA: Diagnosis not present

## 2022-11-19 DIAGNOSIS — Z7982 Long term (current) use of aspirin: Secondary | ICD-10-CM | POA: Diagnosis not present

## 2022-11-19 DIAGNOSIS — E43 Unspecified severe protein-calorie malnutrition: Secondary | ICD-10-CM | POA: Diagnosis not present

## 2022-11-19 DIAGNOSIS — S9032XD Contusion of left foot, subsequent encounter: Secondary | ICD-10-CM | POA: Diagnosis not present

## 2022-11-19 DIAGNOSIS — R413 Other amnesia: Secondary | ICD-10-CM | POA: Diagnosis not present

## 2022-11-19 DIAGNOSIS — N1832 Chronic kidney disease, stage 3b: Secondary | ICD-10-CM | POA: Diagnosis not present

## 2022-11-19 DIAGNOSIS — R55 Syncope and collapse: Secondary | ICD-10-CM | POA: Diagnosis not present

## 2022-11-19 DIAGNOSIS — I129 Hypertensive chronic kidney disease with stage 1 through stage 4 chronic kidney disease, or unspecified chronic kidney disease: Secondary | ICD-10-CM | POA: Diagnosis not present

## 2022-11-19 DIAGNOSIS — Z7984 Long term (current) use of oral hypoglycemic drugs: Secondary | ICD-10-CM | POA: Diagnosis not present

## 2022-11-19 DIAGNOSIS — B9620 Unspecified Escherichia coli [E. coli] as the cause of diseases classified elsewhere: Secondary | ICD-10-CM | POA: Diagnosis not present

## 2022-11-24 DIAGNOSIS — N390 Urinary tract infection, site not specified: Secondary | ICD-10-CM | POA: Diagnosis not present

## 2022-11-24 DIAGNOSIS — N179 Acute kidney failure, unspecified: Secondary | ICD-10-CM | POA: Diagnosis not present

## 2022-11-24 DIAGNOSIS — E1121 Type 2 diabetes mellitus with diabetic nephropathy: Secondary | ICD-10-CM | POA: Diagnosis not present

## 2022-11-24 DIAGNOSIS — E1165 Type 2 diabetes mellitus with hyperglycemia: Secondary | ICD-10-CM | POA: Diagnosis not present

## 2022-11-24 DIAGNOSIS — B9620 Unspecified Escherichia coli [E. coli] as the cause of diseases classified elsewhere: Secondary | ICD-10-CM | POA: Diagnosis not present

## 2022-11-24 DIAGNOSIS — R55 Syncope and collapse: Secondary | ICD-10-CM | POA: Diagnosis not present

## 2022-11-25 ENCOUNTER — Ambulatory Visit: Payer: Medicare Other | Admitting: Internal Medicine

## 2022-12-04 ENCOUNTER — Ambulatory Visit: Payer: Medicare Other | Admitting: Podiatry

## 2022-12-04 ENCOUNTER — Ambulatory Visit (INDEPENDENT_AMBULATORY_CARE_PROVIDER_SITE_OTHER): Payer: Medicare Other | Admitting: Podiatry

## 2022-12-04 DIAGNOSIS — I83893 Varicose veins of bilateral lower extremities with other complications: Secondary | ICD-10-CM
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
## 2022-12-08 DIAGNOSIS — E43 Unspecified severe protein-calorie malnutrition: Secondary | ICD-10-CM | POA: Diagnosis not present

## 2022-12-08 DIAGNOSIS — Z86718 Personal history of other venous thrombosis and embolism: Secondary | ICD-10-CM

## 2022-12-08 DIAGNOSIS — I129 Hypertensive chronic kidney disease with stage 1 through stage 4 chronic kidney disease, or unspecified chronic kidney disease: Secondary | ICD-10-CM | POA: Diagnosis not present

## 2022-12-08 DIAGNOSIS — E1121 Type 2 diabetes mellitus with diabetic nephropathy: Secondary | ICD-10-CM | POA: Diagnosis not present

## 2022-12-08 DIAGNOSIS — E1165 Type 2 diabetes mellitus with hyperglycemia: Secondary | ICD-10-CM | POA: Diagnosis not present

## 2022-12-08 DIAGNOSIS — N179 Acute kidney failure, unspecified: Secondary | ICD-10-CM | POA: Diagnosis not present

## 2022-12-08 DIAGNOSIS — F05 Delirium due to known physiological condition: Secondary | ICD-10-CM | POA: Diagnosis not present

## 2022-12-08 DIAGNOSIS — Z7984 Long term (current) use of oral hypoglycemic drugs: Secondary | ICD-10-CM

## 2022-12-08 DIAGNOSIS — Z7982 Long term (current) use of aspirin: Secondary | ICD-10-CM

## 2022-12-08 DIAGNOSIS — R55 Syncope and collapse: Secondary | ICD-10-CM | POA: Diagnosis not present

## 2022-12-08 DIAGNOSIS — N1832 Chronic kidney disease, stage 3b: Secondary | ICD-10-CM | POA: Diagnosis not present

## 2022-12-08 DIAGNOSIS — S9032XD Contusion of left foot, subsequent encounter: Secondary | ICD-10-CM | POA: Diagnosis not present

## 2022-12-08 DIAGNOSIS — N390 Urinary tract infection, site not specified: Secondary | ICD-10-CM | POA: Diagnosis not present

## 2022-12-08 DIAGNOSIS — M1990 Unspecified osteoarthritis, unspecified site: Secondary | ICD-10-CM

## 2022-12-08 DIAGNOSIS — R413 Other amnesia: Secondary | ICD-10-CM | POA: Diagnosis not present

## 2022-12-08 DIAGNOSIS — B9620 Unspecified Escherichia coli [E. coli] as the cause of diseases classified elsewhere: Secondary | ICD-10-CM | POA: Diagnosis not present

## 2022-12-14 DIAGNOSIS — N179 Acute kidney failure, unspecified: Secondary | ICD-10-CM | POA: Diagnosis not present

## 2022-12-14 DIAGNOSIS — R55 Syncope and collapse: Secondary | ICD-10-CM | POA: Diagnosis not present

## 2022-12-14 DIAGNOSIS — N390 Urinary tract infection, site not specified: Secondary | ICD-10-CM | POA: Diagnosis not present

## 2022-12-14 DIAGNOSIS — E1121 Type 2 diabetes mellitus with diabetic nephropathy: Secondary | ICD-10-CM | POA: Diagnosis not present

## 2022-12-14 DIAGNOSIS — B9620 Unspecified Escherichia coli [E. coli] as the cause of diseases classified elsewhere: Secondary | ICD-10-CM | POA: Diagnosis not present

## 2022-12-14 DIAGNOSIS — E1165 Type 2 diabetes mellitus with hyperglycemia: Secondary | ICD-10-CM | POA: Diagnosis not present

## 2022-12-15 ENCOUNTER — Ambulatory Visit: Payer: Medicare Other | Admitting: Internal Medicine

## 2023-01-08 ENCOUNTER — Encounter: Payer: Self-pay | Admitting: Podiatry

## 2023-01-08 ENCOUNTER — Ambulatory Visit: Payer: Medicare Other | Admitting: Podiatry

## 2023-01-08 DIAGNOSIS — B351 Tinea unguium: Secondary | ICD-10-CM | POA: Diagnosis not present

## 2023-01-08 DIAGNOSIS — E1165 Type 2 diabetes mellitus with hyperglycemia: Secondary | ICD-10-CM | POA: Diagnosis not present

## 2023-01-08 DIAGNOSIS — M79674 Pain in right toe(s): Secondary | ICD-10-CM

## 2023-01-08 DIAGNOSIS — M79675 Pain in left toe(s): Secondary | ICD-10-CM

## 2023-01-08 NOTE — Progress Notes (Signed)
This patient returns to my office for at risk foot care.  This patient requires this care by a professional since this patient will be at risk due to having type 2 diabetes and CKD.  This patient is unable to cut nails herself since the patient cannot reach her nails.These nails are painful walking and wearing shoes.  This patient presents for at risk foot care today.  General Appearance  Alert, conversant and in no acute stress.  Vascular  Dorsalis pedis and posterior tibial  pulses are  weakly palpable  bilaterally.  Capillary return is within normal limits  bilaterally.Cold feet  bilaterally.  Neurologic  Senn-Weinstein monofilament wire test within normal limits  bilaterally. Muscle power within normal limits bilaterally.  Nails Thick disfigured discolored nails with subungual debris  from hallux to fifth toes bilaterally. No evidence of bacterial infection or drainage bilaterally.  Orthopedic  Patient wearing unna boots both feet.  Skin  normotropic skin with no porokeratosis noted bilaterally.  No signs of infections or ulcers noted.     Onychomycosis  Pain in right toes  Pain in left toes  Consent was obtained for treatment procedures.   Mechanical debridement of nails 1-5  bilaterally performed with a nail nipper.  Filed with dremel without incident.  Talked with daughter about her unna boots.  She is to make an appointment with Dr.  Allena Katz.   Return office visit   3 months                   Told patient to return for periodic foot care and evaluation due to potential at risk complications.   Helane Gunther DPM

## 2023-01-15 ENCOUNTER — Ambulatory Visit (INDEPENDENT_AMBULATORY_CARE_PROVIDER_SITE_OTHER): Payer: Medicare Other | Admitting: Podiatry

## 2023-01-15 DIAGNOSIS — M19072 Primary osteoarthritis, left ankle and foot: Secondary | ICD-10-CM | POA: Diagnosis not present
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
## 2023-01-21 ENCOUNTER — Encounter: Payer: Self-pay | Admitting: Internal Medicine

## 2023-01-21 ENCOUNTER — Ambulatory Visit: Payer: Medicare Other | Admitting: Internal Medicine

## 2023-01-29 ENCOUNTER — Emergency Department (HOSPITAL_COMMUNITY): Payer: Medicare Other

## 2023-01-29 ENCOUNTER — Other Ambulatory Visit: Payer: Self-pay

## 2023-01-29 ENCOUNTER — Encounter (HOSPITAL_COMMUNITY): Payer: Self-pay

## 2023-01-29 ENCOUNTER — Inpatient Hospital Stay (HOSPITAL_COMMUNITY)
Admission: EM | Admit: 2023-01-29 | Discharge: 2023-01-31 | DRG: 871 | Disposition: A | Discharge status: Home Health | Payer: Medicare Other | Attending: Internal Medicine | Admitting: Internal Medicine

## 2023-01-29 DIAGNOSIS — R4189 Other symptoms and signs involving cognitive functions and awareness: Secondary | ICD-10-CM | POA: Diagnosis not present

## 2023-01-29 DIAGNOSIS — Z7984 Long term (current) use of oral hypoglycemic drugs: Secondary | ICD-10-CM | POA: Diagnosis not present

## 2023-01-29 DIAGNOSIS — E1165 Type 2 diabetes mellitus with hyperglycemia: Secondary | ICD-10-CM | POA: Diagnosis present

## 2023-01-29 DIAGNOSIS — I129 Hypertensive chronic kidney disease with stage 1 through stage 4 chronic kidney disease, or unspecified chronic kidney disease: Secondary | ICD-10-CM | POA: Diagnosis present

## 2023-01-29 DIAGNOSIS — B9620 Unspecified Escherichia coli [E. coli] as the cause of diseases classified elsewhere: Secondary | ICD-10-CM | POA: Diagnosis present

## 2023-01-29 DIAGNOSIS — M85862 Other specified disorders of bone density and structure, left lower leg: Secondary | ICD-10-CM | POA: Diagnosis present

## 2023-01-29 DIAGNOSIS — Z888 Allergy status to other drugs, medicaments and biological substances status: Secondary | ICD-10-CM

## 2023-01-29 DIAGNOSIS — R21 Rash and other nonspecific skin eruption: Secondary | ICD-10-CM | POA: Diagnosis not present

## 2023-01-29 DIAGNOSIS — R0781 Pleurodynia: Secondary | ICD-10-CM | POA: Diagnosis present

## 2023-01-29 DIAGNOSIS — N1832 Chronic kidney disease, stage 3b: Secondary | ICD-10-CM | POA: Diagnosis present

## 2023-01-29 DIAGNOSIS — I10 Essential (primary) hypertension: Secondary | ICD-10-CM | POA: Insufficient documentation

## 2023-01-29 DIAGNOSIS — Z1624 Resistance to multiple antibiotics: Secondary | ICD-10-CM | POA: Diagnosis present

## 2023-01-29 DIAGNOSIS — Z8249 Family history of ischemic heart disease and other diseases of the circulatory system: Secondary | ICD-10-CM

## 2023-01-29 DIAGNOSIS — Z95828 Presence of other vascular implants and grafts: Secondary | ICD-10-CM | POA: Diagnosis not present

## 2023-01-29 DIAGNOSIS — M8588 Other specified disorders of bone density and structure, other site: Secondary | ICD-10-CM | POA: Diagnosis not present

## 2023-01-29 DIAGNOSIS — G9341 Metabolic encephalopathy: Secondary | ICD-10-CM | POA: Diagnosis present

## 2023-01-29 DIAGNOSIS — Z79899 Other long term (current) drug therapy: Secondary | ICD-10-CM | POA: Diagnosis not present

## 2023-01-29 DIAGNOSIS — Z8049 Family history of malignant neoplasm of other genital organs: Secondary | ICD-10-CM

## 2023-01-29 DIAGNOSIS — Z602 Problems related to living alone: Secondary | ICD-10-CM | POA: Diagnosis present

## 2023-01-29 DIAGNOSIS — Z7982 Long term (current) use of aspirin: Secondary | ICD-10-CM | POA: Diagnosis not present

## 2023-01-29 DIAGNOSIS — Z8781 Personal history of (healed) traumatic fracture: Secondary | ICD-10-CM

## 2023-01-29 DIAGNOSIS — Z86718 Personal history of other venous thrombosis and embolism: Secondary | ICD-10-CM

## 2023-01-29 DIAGNOSIS — Z9889 Other specified postprocedural states: Secondary | ICD-10-CM

## 2023-01-29 DIAGNOSIS — M79662 Pain in left lower leg: Secondary | ICD-10-CM | POA: Diagnosis present

## 2023-01-29 DIAGNOSIS — R32 Unspecified urinary incontinence: Secondary | ICD-10-CM | POA: Diagnosis present

## 2023-01-29 DIAGNOSIS — Z841 Family history of disorders of kidney and ureter: Secondary | ICD-10-CM

## 2023-01-29 DIAGNOSIS — R000 Tachycardia, unspecified: Secondary | ICD-10-CM | POA: Diagnosis not present

## 2023-01-29 DIAGNOSIS — Z803 Family history of malignant neoplasm of breast: Secondary | ICD-10-CM | POA: Diagnosis not present

## 2023-01-29 DIAGNOSIS — M79672 Pain in left foot: Secondary | ICD-10-CM | POA: Diagnosis present

## 2023-01-29 DIAGNOSIS — Z800 Family history of malignant neoplasm of digestive organs: Secondary | ICD-10-CM | POA: Diagnosis not present

## 2023-01-29 DIAGNOSIS — L258 Unspecified contact dermatitis due to other agents: Secondary | ICD-10-CM | POA: Diagnosis not present

## 2023-01-29 DIAGNOSIS — A419 Sepsis, unspecified organism: Principal | ICD-10-CM | POA: Diagnosis present

## 2023-01-29 DIAGNOSIS — N390 Urinary tract infection, site not specified: Secondary | ICD-10-CM | POA: Diagnosis present

## 2023-01-29 DIAGNOSIS — M1990 Unspecified osteoarthritis, unspecified site: Secondary | ICD-10-CM | POA: Diagnosis present

## 2023-01-29 DIAGNOSIS — Z885 Allergy status to narcotic agent status: Secondary | ICD-10-CM

## 2023-01-29 DIAGNOSIS — Z90710 Acquired absence of both cervix and uterus: Secondary | ICD-10-CM

## 2023-01-29 DIAGNOSIS — L299 Pruritus, unspecified: Secondary | ICD-10-CM | POA: Diagnosis not present

## 2023-01-29 DIAGNOSIS — Z833 Family history of diabetes mellitus: Secondary | ICD-10-CM

## 2023-01-29 DIAGNOSIS — L24A2 Irritant contact dermatitis due to fecal, urinary or dual incontinence: Secondary | ICD-10-CM | POA: Diagnosis present

## 2023-01-29 DIAGNOSIS — Z8719 Personal history of other diseases of the digestive system: Secondary | ICD-10-CM

## 2023-01-29 DIAGNOSIS — Z8042 Family history of malignant neoplasm of prostate: Secondary | ICD-10-CM | POA: Diagnosis not present

## 2023-01-29 DIAGNOSIS — N3000 Acute cystitis without hematuria: Secondary | ICD-10-CM

## 2023-01-29 DIAGNOSIS — F0390 Unspecified dementia without behavioral disturbance: Secondary | ICD-10-CM | POA: Diagnosis present

## 2023-01-29 DIAGNOSIS — E1122 Type 2 diabetes mellitus with diabetic chronic kidney disease: Secondary | ICD-10-CM | POA: Diagnosis present

## 2023-01-29 DIAGNOSIS — L259 Unspecified contact dermatitis, unspecified cause: Secondary | ICD-10-CM

## 2023-01-29 DIAGNOSIS — Z8619 Personal history of other infectious and parasitic diseases: Secondary | ICD-10-CM

## 2023-01-29 DIAGNOSIS — K219 Gastro-esophageal reflux disease without esophagitis: Secondary | ICD-10-CM | POA: Diagnosis present

## 2023-01-29 LAB — CBC
HCT: 39.2 % (ref 36.0–46.0)
Hemoglobin: 12.9 g/dL (ref 12.0–15.0)
MCH: 30.9 pg (ref 26.0–34.0)
MCHC: 32.9 g/dL (ref 30.0–36.0)
MCV: 93.8 fL (ref 80.0–100.0)
Platelets: 253 10*3/uL (ref 150–400)
RBC: 4.18 MIL/uL (ref 3.87–5.11)
RDW: 13.8 % (ref 11.5–15.5)
WBC: 12.3 10*3/uL — ABNORMAL HIGH (ref 4.0–10.5)
nRBC: 0 % (ref 0.0–0.2)

## 2023-01-29 LAB — URINALYSIS, W/ REFLEX TO CULTURE (INFECTION SUSPECTED)
Bilirubin Urine: NEGATIVE
Glucose, UA: 150 mg/dL — AB
Ketones, ur: NEGATIVE mg/dL
Nitrite: NEGATIVE
Protein, ur: NEGATIVE mg/dL
Specific Gravity, Urine: 1.012 (ref 1.005–1.030)
WBC, UA: >50 WBC/hpf (ref 0–5)
pH: 5 (ref 5.0–8.0)

## 2023-01-29 LAB — CBG MONITORING, ED
Glucose-Capillary: 313 mg/dL — ABNORMAL HIGH (ref 70–99)
Glucose-Capillary: 193 mg/dL — ABNORMAL HIGH (ref 70–99)

## 2023-01-29 LAB — COMPREHENSIVE METABOLIC PANEL
ALT: 37 U/L (ref 0–44)
AST: 32 U/L (ref 15–41)
Albumin: 3.3 g/dL — ABNORMAL LOW (ref 3.5–5.0)
Alkaline Phosphatase: 152 U/L — ABNORMAL HIGH (ref 38–126)
Anion gap: 11 (ref 5–15)
BUN: 43 mg/dL — ABNORMAL HIGH (ref 8–23)
CO2: 19 mmol/L — ABNORMAL LOW (ref 22–32)
Calcium: 9.6 mg/dL (ref 8.9–10.3)
Chloride: 110 mmol/L (ref 98–111)
Creatinine, Ser: 1.61 mg/dL — ABNORMAL HIGH (ref 0.44–1.00)
GFR, Estimated: 32 mL/min — ABNORMAL LOW (ref >60)
Glucose, Bld: 301 mg/dL — ABNORMAL HIGH (ref 70–99)
Potassium: 3.6 mmol/L (ref 3.5–5.1)
Sodium: 140 mmol/L (ref 135–145)
Total Bilirubin: 0.9 mg/dL (ref 0.3–1.2)
Total Protein: 6.3 g/dL — ABNORMAL LOW (ref 6.5–8.1)

## 2023-01-29 MED ORDER — LACTATED RINGERS IV BOLUS (SEPSIS)
1000.0000 mL | Freq: Once | INTRAVENOUS | Status: AC
  Administered 2023-01-29: 1000 mL via INTRAVENOUS

## 2023-01-29 MED ORDER — SODIUM CHLORIDE 0.9 % IV SOLN
2.0000 g | Freq: Once | INTRAVENOUS | Status: AC
  Administered 2023-01-29: 2 g via INTRAVENOUS
  Filled 2023-01-29: qty 12.5

## 2023-01-29 MED ORDER — METRONIDAZOLE 500 MG/100ML IV SOLN
500.0000 mg | Freq: Once | INTRAVENOUS | Status: AC
  Administered 2023-01-29: 500 mg via INTRAVENOUS
  Filled 2023-01-29: qty 100

## 2023-01-29 MED ORDER — LACTATED RINGERS IV BOLUS (SEPSIS)
500.0000 mL | Freq: Once | INTRAVENOUS | Status: AC
  Administered 2023-01-30: 500 mL via INTRAVENOUS

## 2023-01-29 MED ORDER — VANCOMYCIN HCL IN DEXTROSE 1-5 GM/200ML-% IV SOLN
1000.0000 mg | Freq: Once | INTRAVENOUS | Status: AC
  Administered 2023-01-29: 1000 mg via INTRAVENOUS
  Filled 2023-01-29: qty 200

## 2023-01-29 MED ORDER — LACTATED RINGERS IV SOLN: INTRAVENOUS | Status: AC

## 2023-01-29 MED ORDER — BACITRACIN ZINC 500 UNIT/GM EX OINT
TOPICAL_OINTMENT | Freq: Once | CUTANEOUS | Status: AC
  Administered 2023-01-29: 6 via TOPICAL
  Filled 2023-01-29: qty 5.4

## 2023-01-29 NOTE — ED Notes (Signed)
Pure wick requested for PT due to skin condition

## 2023-01-29 NOTE — ED Triage Notes (Signed)
Pt BIB [facility] with reports of left leg pain and wound x 1 year and new onset of confusion 1 month ago. Pt oriented to person and place at this time.

## 2023-01-29 NOTE — Progress Notes (Signed)
ED Pharmacy Antibiotic Sign Off An antibiotic consult was received from an ED provider for cefepime and vancomycin per pharmacy dosing for sepsis. A chart review was completed to assess appropriateness.  A single dose of cefepime and vancomycin was placed by the ED provider.   The following one time order(s) were placed per pharmacy consult: none  Further antibiotic and/or antibiotic pharmacy consults should be ordered by the admitting provider if indicated.   Thank you for allowing pharmacy to be a part of this patient's care.   Delmar Landau, PharmD, BCPS 01/29/2023 10:22 PM ED Clinical Pharmacist -  [phone]

## 2023-01-29 NOTE — ED Notes (Addendum)
PT's feet were cold to touch but pulses felt bilaterally. PT screamed in pain when right leg was touched. Left leg is swollen.PT also has a rash in between her legs and buttocks

## 2023-01-29 NOTE — ED Provider Notes (Signed)
[place] EMERGENCY DEPARTMENT AT [facility] Provider Note   CSN: [id number] Arrival date & time: 01/29/23  1224     History  Chief Complaint  Patient presents with   Leg Pain   Altered Mental Status    Brandy Gonzales is a 82 y.o. female with a history of hypertension, diabetes mellitus, stage III chronic kidney disease, and osteoarthritis who presents the ED today for leg pain.  Patient reports she has been having pain in her lower legs bilaterally for about a year now.  Additionally, patient has been having new onset confusion for the past month, according to patient's family.     Patient has a history of DVT in the past for which she was taking Xarelto for daily.  3 months ago she was stopped on Xarelto and switched to aspirin daily, which she reports taking as instructed.  States that the pain has not worsened since stopping Xarelto.  She is able to ambulate independently without assistance. She also reports living alone.  I called and spoke with patient's daughter.  She states that they called EMS because of the rash on her bottom.  She states that patient has been urinating and defecating on herself and has not wanted to get up to go to the bedside commode.  Daughter states that patient has not been complaining of any worsening pain the legs.    Home Medications Prior to Admission medications   Medication Sig Start Date End Date Taking? Authorizing Provider  acetaminophen (TYLENOL) 500 MG tablet Take 500 mg by mouth at bedtime.    [provider]  aspirin EC 81 MG tablet Take 81 mg by mouth daily. Swallow whole.    [provider]  carvedilol (COREG) 25 MG tablet Take 1 tablet (25 mg total) by mouth 2 (two) times daily with a meal. 04/22/22   [name], [name], MD  Cholecalciferol (VITAMIN D3) 50 MCG (2000 UT) capsule Take 1 capsule (2,000 Units total) by mouth daily. 08/02/19   [name], [name], MD  cyanocobalamin 1000 MCG tablet Take 1 tablet  (1,000 mcg total) by mouth daily. 05/02/22   [place], Alvira Philips, DO  folic acid (FOLVITE) 1 MG tablet Take 1 tablet (1 mg total) by mouth daily. 05/01/22   [place], Alvira Philips, DO  glimepiride (AMARYL) 2 MG tablet Take 1 tablet (2 mg total) by mouth daily before breakfast. 04/22/22   [name], [name], MD  omeprazole (PRILOSEC) 20 MG capsule Take 1 capsule (20 mg total) by mouth daily. 04/22/22   [name], [name], MD  thiamine (VITAMIN B-1) 100 MG tablet Take 1 tablet (100 mg total) by mouth daily. Patient taking differently: Take 100 mg by mouth at bedtime. 05/01/22   [place], Alvira Philips, DO  torsemide (DEMADEX) 20 MG tablet Take 20 mg by mouth 3 (three) times a week. 1 tablet M-W-F for edema    [provider]      Allergies    Amlodipine besylate, Atenolol, Oxycodone hcl, Propoxyphene n-acetaminophen, and Enalapril maleate    Review of Systems   Review of Systems  Musculoskeletal:        Left leg pain  All other systems reviewed and are negative.   Physical Exam Updated Vital Signs BP (!) 171/95   Pulse 100   Temp 98.6 F (37 C) (Oral)   Resp 17   Ht 4\' 11"  (1.499 m)   Wt 49.9 kg   SpO2 100%   BMI 22.22 kg/m  Physical  Exam Vitals and nursing note reviewed.  Constitutional:      General: She is not in acute distress.    Appearance: Normal appearance.  HENT:     Head: Normocephalic and atraumatic.     Mouth/Throat:     Mouth: Mucous membranes are moist.  Eyes:     Conjunctiva/sclera: Conjunctivae normal.     Pupils: Pupils are equal, round, and reactive to light.  Cardiovascular:     Rate and Rhythm: Normal rate and regular rhythm.     Pulses: Normal pulses.     Heart sounds: Normal heart sounds.  Pulmonary:     Effort: Pulmonary effort is normal.     Breath sounds: Normal breath sounds.  Abdominal:     Palpations: Abdomen is soft.     Tenderness: There is no abdominal tenderness.  Musculoskeletal:        General: Tenderness present.     Comments:  Tenderness to palpation of the right ribs without gross deformity.  Tenderness to palpation of left anterior tib/fib with swelling present. There is scar tissue at the site and a surgical. Mild left ankle swelling. Tenderness to lateral right calf without swelling, erythema, bony tenderness or deformity. Palpable dorsalis pedis pulses bilaterally.  Patient's feet were cold but her calves were warm.  Patient's feet were also uncovered in the room.  Upper and lower extremity strength and sensation intact bilaterally.  Skin:    General: Skin is warm and dry.     Findings: Erythema and rash present.     Comments: Swelling and scarring of skin present at the site of tenderness on left lower anterior tib/fib. Image provided below.  Erythematous rash in between upper thighs.  Neurological:     General: No focal deficit present.     Mental Status: She is alert.     Sensory: No sensory deficit.     Motor: No weakness.  Psychiatric:        Mood and Affect: Mood normal.        Behavior: Behavior normal.         ED Results / Procedures / Treatments   Labs (all labs ordered are listed, but only abnormal results are displayed) Labs Reviewed  COMPREHENSIVE METABOLIC PANEL - Abnormal; Notable for the following components:      Result Value   CO2 19 (*)    Glucose, Bld 301 (*)    BUN 43 (*)    Creatinine, Ser 1.61 (*)    Total Protein 6.3 (*)    Albumin 3.3 (*)    Alkaline Phosphatase 152 (*)    GFR, Estimated 32 (*)    All other components within normal limits  CBC - Abnormal; Notable for the following components:   WBC 12.3 (*)    All other components within normal limits  URINALYSIS, W/ REFLEX TO CULTURE (INFECTION SUSPECTED) - Abnormal; Notable for the following components:   APPearance CLOUDY (*)    Glucose, UA 150 (*)    Hgb urine dipstick SMALL (*)    Leukocytes,Ua LARGE (*)    Bacteria, UA MANY (*)    All other components within normal limits  CBG MONITORING, ED - Abnormal;  Notable for the following components:   Glucose-Capillary 313 (*)    All other components within normal limits  CBG MONITORING, ED - Abnormal; Notable for the following components:   Glucose-Capillary 193 (*)    All other components within normal limits  URINE CULTURE  CULTURE, BLOOD (SINGLE)  I-STAT  CG4 LACTIC ACID, ED    EKG None  Radiology DG Tibia/Fibula Left  Result Date: 01/29/2023 CLINICAL DATA:  Right lower leg pain that has been going on while EXAM: LEFT TIBIA AND FIBULA - 2 VIEW COMPARISON:  X-ray and CT scan 02/21/2022. FINDINGS: There has been extensive hardware placement. Intramedullary rod in place along the tibia. Three proximal and 3 distal fixation screws. Chronic appearing posttraumatic deformity with callus along the midshaft of the tibia. Stable protruding component of callus anteriorly along the distal diaphysis. There also 2 screws obliquely seen along the medial malleolus. Lateral fixation plates seen with numerous screws along the mid to distal shaft of the fibula. No obvious hardware failure. On this limited portable views the more distal of the 3 proximal tibial fixation screws is not quite flush with the adjacent bone but no significant surrounding lucency along the screw in the projections are different than the prior x-ray. The appearance is more similar to the CT image of 02/21/2022. No acute fracture or dislocation. Osteopenia. Extensive vascular calcifications and other soft tissue calcifications about the knee and proximal tibia and fibula. IMPRESSION: Extensive hardware along the tibia and fibula. Old, traumatic bony deformity identified along the tibia greater than fibula. Osteopenia with chronic changes. Please correlate with particular symptoms and location. Electronically Signed   By: Karen Kays M.D.   On: 01/29/2023 18:43   DG Ribs Unilateral W/Chest Right  Result Date: 01/29/2023 CLINICAL DATA:  Rib pain that has been going on for a while. Confusion. No  reported history of trauma EXAM: RIGHT RIBS AND CHEST - 3 VIEW COMPARISON:  Chest x-ray 10/14/2022 FINDINGS: No consolidation, pneumothorax or effusion. No edema. Normal cardiopericardial silhouette. Tortuous and ectatic aorta. Osteopenia with degenerative changes. No rib fracture seen by x-ray. The reviews are essentially AP. No significant oblique. Is also some elevation of the right humeral head relative to the acromion. Please correlate for rotator cuff tear IMPRESSION: Limited rib views. Osteopenia. No definite rib fracture by x-ray. No pneumothorax or effusion. Electronically Signed   By: Karen Kays M.D.   On: 01/29/2023 18:38    Procedures Procedures: not indicated.   Medications Ordered in ED Medications  lactated ringers infusion (has no administration in time range)  lactated ringers bolus 1,000 mL (1,000 mLs Intravenous New Bag/Given 01/29/23 2322)    And  lactated ringers bolus 500 mL (has no administration in time range)  ceFEPIme (MAXIPIME) 2 g in sodium chloride 0.9 % 100 mL IVPB (2 g Intravenous New Bag/Given 01/29/23 2324)  metroNIDAZOLE (FLAGYL) IVPB 500 mg (500 mg Intravenous New Bag/Given 01/29/23 2325)  vancomycin (VANCOCIN) IVPB 1000 mg/200 mL premix (1,000 mg Intravenous New Bag/Given 01/29/23 2321)  bacitracin ointment (6 Applications Topical Given 01/29/23 2023)    ED Course/ Medical Decision Making/ A&P                                 Medical Decision Making Amount and/or Complexity of Data Reviewed Labs: ordered. Radiology: ordered.   This patient presents to the ED for concern of left leg pain, this involves an extensive number of treatment options, and is a complaint that carries with it a high risk of complications and morbidity.   Differential diagnosis includes: fracture, dislocation, osteoarthritis, PAD, DVT, UTI, AMS, sepsis, etc.   Comorbidities  See HPI above   Additional History  Additional history obtained from previous records.   Lab  Tests  I ordered and personally interpreted labs.  The pertinent results include:   CMP shows elevated glucose of 301, otherwise within normal limits for patient with her history of CKD CBG 4 hours later shows glucose of 193 CBC has elevated WBC of 12.3, otherwise unremarkable. UA shows large leukocytes and many bacteria, culture pending.   Imaging Studies  I ordered imaging studies including right ribs and left tibia/fibula x-ray  I independently visualized and interpreted imaging which showed:  Imaging of ribs shows osteopenia with no definite rib fracture on x-ray.  No pneumothorax or effusion. Left tib/fib imaging shows stents of hardware along the tibia and the fibula with old traumatic bony deformity identified along the tibia greater than fibula.  Osteopenia with chronic changes. I agree with the radiologist interpretation. Beside ultrasound was used and no doppler flow appreciated on bilateral dorsalis pedis pulses bilaterally.   Consultations  I requested consultation with hospitalist,  and discussed lab and imaging findings as well as pertinent plan - they recommend: admission for further evaluation. Dr. Donnald Garre consulted with vascular surgery and they will come assess patient. They found pulses.   Problem List / ED Course / Critical Interventions / Medication Management  Left lower leg pain x1 year and confusion x1 month On examination patient reports tenderness to palpation of left and right calf for the past year.  Patient is still able to ambulate on her own without a cane, walker, or wheelchair. While patient was being put on the bed pan, to help obtain urine for UA, an erythematous rash was noted between her inner thighs and buttocks with thin layers of skin coming off of her buttocks.  The location was cleaned and bacitracin ointment was applied to facilitate healing. I ordered medications including: Patient denied the need for pain medication for her chronic lower  leg pain. Bacitracin ointment for rash on buttocks and inner thighs  Reevaluation of the patient after these medicines showed that the patient stayed the same I have reviewed the patients home medicines and have made adjustments as needed   Social Determinants of Health  Housing   Test / Admission - Considered  Patient to be admitted for further workup and evaluation.        Final Clinical Impression(s) / ED Diagnoses Final diagnoses:  Sepsis, due to unspecified organism, unspecified whether acute organ dysfunction present [facility])    Rx / DC Orders ED Discharge Orders     None         Maxwell Marion, PA-C 01/29/23 2345    Arby Barrette, MD 01/30/23 [phone]

## 2023-01-29 NOTE — ED Provider Notes (Incomplete)
I provided a substantive portion of the care of this patient.  I personally made/approved the management plan for this patient and take responsibility for the patient management. {Remember to document shared critical care using "edcritical" dot phrase:1}     Consult: 22:52 Reviewed with Dr. Myra Gianotti vascular surgery.  He will evaluate the patient in the emergency department.

## 2023-01-29 NOTE — ED Notes (Signed)
Put PT on bedpan to collect a urinalyis, if not successful will in and out her.

## 2023-01-29 NOTE — ED Notes (Signed)
Patient transported to CT 

## 2023-01-29 NOTE — ED Provider Triage Note (Signed)
Emergency Medicine Provider Triage Evaluation Note  Brandy Gonzales , a 82 y.o. female  was evaluated in triage.  Pt complains of leg lower leg pain and right rib pain that have been going on for "a while." Family is concerned that patient has been confused x1 month.  Review of Systems  Positive: Left anterior shin wound  with tenderness to touch, right rib pain Negative:   Physical Exam  BP (!) 164/85 (BP Location: Right Arm)   Pulse 98   Temp 98.5 F (36.9 C) (Oral)   Resp 19   SpO2 97%  Gen:   Awake, no distress   Resp:  Normal effort  MSK:   Moves extremities without difficulty  Other:  Wound at left lower anterior tib/fib  Medical Decision Making  Medically screening exam initiated at 2:45 PM.  Appropriate orders placed.  Brandy Gonzales was informed that the remainder of the evaluation will be completed by another provider, this initial triage assessment does not replace that evaluation, and the importance of remaining in the ED until their evaluation is complete.   Brandy Marion, PA-C 01/29/23 1447

## 2023-01-29 NOTE — Sepsis Progress Note (Signed)
Elink monitoring for the code sepsis protocol.  

## 2023-01-29 NOTE — Sepsis Progress Note (Addendum)
Secure chat sent to bedside RN to verify if lactic & blood cultures have been collected yet, & if antibiotics have been started.  All are showing as still needing to be done in Epic. Per RN, labs delayed d/t patient being a difficult stick.  Antibiotics started.
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
## 2023-01-30 ENCOUNTER — Encounter (HOSPITAL_COMMUNITY): Payer: Self-pay | Admitting: Family Medicine

## 2023-01-30 DIAGNOSIS — Z95828 Presence of other vascular implants and grafts: Secondary | ICD-10-CM | POA: Diagnosis not present

## 2023-01-30 DIAGNOSIS — G9341 Metabolic encephalopathy: Secondary | ICD-10-CM | POA: Diagnosis present

## 2023-01-30 DIAGNOSIS — L259 Unspecified contact dermatitis, unspecified cause: Secondary | ICD-10-CM

## 2023-01-30 DIAGNOSIS — A419 Sepsis, unspecified organism: Secondary | ICD-10-CM | POA: Diagnosis present

## 2023-01-30 DIAGNOSIS — N390 Urinary tract infection, site not specified: Secondary | ICD-10-CM

## 2023-01-30 DIAGNOSIS — M85862 Other specified disorders of bone density and structure, left lower leg: Secondary | ICD-10-CM | POA: Diagnosis present

## 2023-01-30 DIAGNOSIS — Z800 Family history of malignant neoplasm of digestive organs: Secondary | ICD-10-CM | POA: Diagnosis not present

## 2023-01-30 DIAGNOSIS — B9620 Unspecified Escherichia coli [E. coli] as the cause of diseases classified elsewhere: Secondary | ICD-10-CM | POA: Diagnosis present

## 2023-01-30 DIAGNOSIS — Z8049 Family history of malignant neoplasm of other genital organs: Secondary | ICD-10-CM | POA: Diagnosis not present

## 2023-01-30 DIAGNOSIS — N1832 Chronic kidney disease, stage 3b: Secondary | ICD-10-CM | POA: Diagnosis present

## 2023-01-30 DIAGNOSIS — Z8249 Family history of ischemic heart disease and other diseases of the circulatory system: Secondary | ICD-10-CM | POA: Diagnosis not present

## 2023-01-30 DIAGNOSIS — Z833 Family history of diabetes mellitus: Secondary | ICD-10-CM | POA: Diagnosis not present

## 2023-01-30 DIAGNOSIS — I10 Essential (primary) hypertension: Secondary | ICD-10-CM

## 2023-01-30 DIAGNOSIS — E1165 Type 2 diabetes mellitus with hyperglycemia: Secondary | ICD-10-CM

## 2023-01-30 DIAGNOSIS — R4189 Other symptoms and signs involving cognitive functions and awareness: Secondary | ICD-10-CM

## 2023-01-30 DIAGNOSIS — L258 Unspecified contact dermatitis due to other agents: Secondary | ICD-10-CM | POA: Diagnosis not present

## 2023-01-30 DIAGNOSIS — Z1624 Resistance to multiple antibiotics: Secondary | ICD-10-CM | POA: Diagnosis present

## 2023-01-30 DIAGNOSIS — F0390 Unspecified dementia without behavioral disturbance: Secondary | ICD-10-CM | POA: Diagnosis present

## 2023-01-30 DIAGNOSIS — Z79899 Other long term (current) drug therapy: Secondary | ICD-10-CM | POA: Diagnosis not present

## 2023-01-30 DIAGNOSIS — Z841 Family history of disorders of kidney and ureter: Secondary | ICD-10-CM | POA: Diagnosis not present

## 2023-01-30 DIAGNOSIS — I129 Hypertensive chronic kidney disease with stage 1 through stage 4 chronic kidney disease, or unspecified chronic kidney disease: Secondary | ICD-10-CM | POA: Diagnosis present

## 2023-01-30 DIAGNOSIS — Z86718 Personal history of other venous thrombosis and embolism: Secondary | ICD-10-CM | POA: Diagnosis not present

## 2023-01-30 DIAGNOSIS — E1122 Type 2 diabetes mellitus with diabetic chronic kidney disease: Secondary | ICD-10-CM | POA: Diagnosis present

## 2023-01-30 DIAGNOSIS — Z8042 Family history of malignant neoplasm of prostate: Secondary | ICD-10-CM | POA: Diagnosis not present

## 2023-01-30 DIAGNOSIS — Z7984 Long term (current) use of oral hypoglycemic drugs: Secondary | ICD-10-CM | POA: Diagnosis not present

## 2023-01-30 DIAGNOSIS — Z7982 Long term (current) use of aspirin: Secondary | ICD-10-CM | POA: Diagnosis not present

## 2023-01-30 DIAGNOSIS — R32 Unspecified urinary incontinence: Secondary | ICD-10-CM | POA: Diagnosis present

## 2023-01-30 DIAGNOSIS — Z803 Family history of malignant neoplasm of breast: Secondary | ICD-10-CM | POA: Diagnosis not present

## 2023-01-30 HISTORY — DX: Sepsis, unspecified organism: A41.9

## 2023-01-30 HISTORY — DX: Other symptoms and signs involving cognitive functions and awareness: R41.89

## 2023-01-30 HISTORY — DX: Unspecified contact dermatitis, unspecified cause: L25.9

## 2023-01-30 LAB — BASIC METABOLIC PANEL
Anion gap: 11 (ref 5–15)
BUN: 34 mg/dL — ABNORMAL HIGH (ref 8–23)
CO2: 17 mmol/L — ABNORMAL LOW (ref 22–32)
Calcium: 9.4 mg/dL (ref 8.9–10.3)
Chloride: 115 mmol/L — ABNORMAL HIGH (ref 98–111)
Creatinine, Ser: 1.33 mg/dL — ABNORMAL HIGH (ref 0.44–1.00)
GFR, Estimated: 40 mL/min — ABNORMAL LOW (ref >60)
Glucose, Bld: 230 mg/dL — ABNORMAL HIGH (ref 70–99)
Potassium: 3.4 mmol/L — ABNORMAL LOW (ref 3.5–5.1)
Sodium: 143 mmol/L (ref 135–145)

## 2023-01-30 LAB — CBC
HCT: 33.8 % — ABNORMAL LOW (ref 36.0–46.0)
Hemoglobin: 11.1 g/dL — ABNORMAL LOW (ref 12.0–15.0)
MCH: 30.4 pg (ref 26.0–34.0)
MCHC: 32.8 g/dL (ref 30.0–36.0)
MCV: 92.6 fL (ref 80.0–100.0)
Platelets: 215 10*3/uL (ref 150–400)
RBC: 3.65 MIL/uL — ABNORMAL LOW (ref 3.87–5.11)
RDW: 13.9 % (ref 11.5–15.5)
WBC: 10.7 10*3/uL — ABNORMAL HIGH (ref 4.0–10.5)
nRBC: 0 % (ref 0.0–0.2)

## 2023-01-30 LAB — GLUCOSE, CAPILLARY
Glucose-Capillary: 179 mg/dL — ABNORMAL HIGH (ref 70–99)
Glucose-Capillary: 164 mg/dL — ABNORMAL HIGH (ref 70–99)
Glucose-Capillary: 185 mg/dL — ABNORMAL HIGH (ref 70–99)
Glucose-Capillary: 132 mg/dL — ABNORMAL HIGH (ref 70–99)

## 2023-01-30 LAB — HEMOGLOBIN A1C
Hgb A1c MFr Bld: 6 % — ABNORMAL HIGH (ref 4.8–5.6)
Mean Plasma Glucose: 125.5 mg/dL

## 2023-01-30 LAB — I-STAT CG4 LACTIC ACID, ED: Lactic Acid, Venous: 1 mmol/L (ref 0.5–1.9)

## 2023-01-30 MED ORDER — CARVEDILOL 25 MG PO TABS
25.0000 mg | ORAL_TABLET | Freq: Two times a day (BID) | ORAL | Status: DC
  Administered 2023-01-30 – 2023-01-31 (×2): 25 mg via ORAL
  Filled 2023-01-30 (×3): qty 1

## 2023-01-30 MED ORDER — INSULIN ASPART 100 UNIT/ML IJ SOLN
0.0000 [IU] | Freq: Three times a day (TID) | INTRAMUSCULAR | Status: DC
  Administered 2023-01-30 (×2): 2 [IU] via SUBCUTANEOUS

## 2023-01-30 MED ORDER — ENSURE ENLIVE PO LIQD
237.0000 mL | Freq: Two times a day (BID) | ORAL | Status: DC
  Administered 2023-01-31 (×2): 237 mL via ORAL

## 2023-01-30 MED ORDER — ZINC OXIDE 40 % EX OINT
TOPICAL_OINTMENT | Freq: Two times a day (BID) | CUTANEOUS | Status: DC
  Administered 2023-01-30: 1 via TOPICAL
  Filled 2023-01-30: qty 57

## 2023-01-30 MED ORDER — ENOXAPARIN SODIUM 30 MG/0.3ML IJ SOSY
30.0000 mg | PREFILLED_SYRINGE | INTRAMUSCULAR | Status: DC
  Administered 2023-01-30 – 2023-01-31 (×2): 30 mg via SUBCUTANEOUS
  Filled 2023-01-30 (×2): qty 0.3

## 2023-01-30 MED ORDER — BACITRACIN ZINC 500 UNIT/GM EX OINT
TOPICAL_OINTMENT | Freq: Two times a day (BID) | CUTANEOUS | Status: DC
  Administered 2023-01-30: 1 via TOPICAL
  Administered 2023-01-31: 31.5 via TOPICAL
  Filled 2023-01-30: qty 28.4
  Filled 2023-01-30: qty 3.6

## 2023-01-30 MED ORDER — ASPIRIN 81 MG PO TBEC
81.0000 mg | DELAYED_RELEASE_TABLET | Freq: Every day | ORAL | Status: DC
  Administered 2023-01-30: 81 mg via ORAL
  Filled 2023-01-30 (×2): qty 1

## 2023-01-30 MED ORDER — PANTOPRAZOLE SODIUM 40 MG PO TBEC
40.0000 mg | DELAYED_RELEASE_TABLET | Freq: Every day | ORAL | Status: DC
  Administered 2023-01-30 – 2023-01-31 (×2): 40 mg via ORAL
  Filled 2023-01-30 (×2): qty 1

## 2023-01-30 MED ORDER — SODIUM CHLORIDE 0.9 % IV SOLN
1.0000 g | INTRAVENOUS | Status: DC
  Administered 2023-01-30: 1 g via INTRAVENOUS
  Filled 2023-01-30: qty 10

## 2023-01-30 NOTE — Assessment & Plan Note (Signed)
-   Patient alert and oriented only to self and place.  Unclear baseline.

## 2023-01-30 NOTE — Progress Notes (Signed)
OT Cancellation Note  Patient Details Name: Brandy Gonzales MRN: [id number] DOB: February 09, 1941   Cancelled Treatment:    Reason Eval/Treat Not Completed: Patient declined, no reason specified (Pt declined all OOB and evaluation attempts despite maximal encouragement. OT to follow up as time allows)  Donia Pounds 01/30/2023, 11:11 AM

## 2023-01-30 NOTE — Assessment & Plan Note (Addendum)
-   Has a maculopapular rash diffusely over her groin/perineum region possibly contact dermatitis/folliculitis from frequent exposure to her urine from incontinence.  Unknown whether she has been assisted with bathing at home. -Will put on bacitracin cream and Desitin.  Patient already on IV antibiotics for UTI as well.

## 2023-01-30 NOTE — Assessment & Plan Note (Addendum)
-   Patient presented with tachycardia and leukocytosis with positive UA - Has received broad-spectrum antibiotics initially for sepsis protocol.  Will transition to IV Rocephin only. - Follow urine culture -Keep on continuous IV fluids

## 2023-01-30 NOTE — ED Notes (Addendum)
Ointment and Destin not applied at time because PT still has not been changed. Notified Tech Chelsea Aus) and she stated that she forgot due to having to take pt's upstairs

## 2023-01-30 NOTE — Assessment & Plan Note (Signed)
-   Creatinine stable at 1.6.  Baseline of about 1.4-1.6.

## 2023-01-30 NOTE — TOC Initial Note (Signed)
Transition of Care [facility]) - Initial/Assessment Note    Patient Details  Name: Brandy Gonzales MRN: [id number] Date of Birth: 04-May-1941  Transition of Care [facility]) CM/SW Contact:    Lawerance Sabal, RN Phone Number: 01/30/2023, 3:13 PM  Clinical Narrative:                  Sherron Monday w patient's daughter over the phone, [name].  She states that the patient lives with her 2 granddaughters and one great grandson. Address confirmed.  Patient recently finished with [facility] and would like to continue with them if she were to DC home.  Currently patient is very lethargic, per [name], not able to participate in therapies. Discussed some needs, and [name] states a home hospital bed would be beneficial, patient currently has cane, RW bedside commode.   [name] agreeable to waiting to see how patient does with therapy once mentation improves for final determination of DME needs.   TOC will continue to follow        Patient Goals and CMS Choice            Expected Discharge Plan and Services                                              Prior Living Arrangements/Services                       Activities of Daily Living Home Assistive Devices/Equipment: Other (Comment) ADL Screening (condition at time of admission) Patient's cognitive ability adequate to safely complete daily activities?: No Is the patient deaf or have difficulty hearing?: No Does the patient have difficulty seeing, even when wearing glasses/contacts?: No Does the patient have difficulty concentrating, remembering, or making decisions?: Yes Patient able to express need for assistance with ADLs?: No Does the patient have difficulty dressing or bathing?: Yes Independently performs ADLs?: No Communication: Dependent Is this a change from baseline?: Pre-admission baseline Dressing (OT): Dependent Is this a change from baseline?: Pre-admission baseline Grooming: Dependent Is this a change from baseline?:  Pre-admission baseline Feeding: Dependent Is this a change from baseline?: Pre-admission baseline Bathing: Dependent Is this a change from baseline?: Pre-admission baseline Toileting: Dependent Is this a change from baseline?: Pre-admission baseline In/Out Bed: Dependent Is this a change from baseline?: Pre-admission baseline Walks in Home: Dependent Is this a change from baseline?: Pre-admission baseline Does the patient have difficulty walking or climbing stairs?: Yes Weakness of Legs: Both Weakness of Arms/Hands: Both  Permission Sought/Granted                  Emotional Assessment              Admission diagnosis:  Sepsis secondary to UTI (HCC) [A41.9, N39.0] Sepsis, due to unspecified organism, unspecified whether acute organ dysfunction present [facility]) [A41.9] Patient Active Problem List   Diagnosis Date Noted   Sepsis secondary to UTI (HCC) 01/30/2023   Cognitive impairment 01/30/2023   Contact dermatitis 01/30/2023   UTI (urinary tract infection) 10/15/2022   History of Bell's palsy 10/15/2022   Hematoma of left foot 10/15/2022   Hematoma 10/09/2022   Anemia 06/11/2022   AKI (acute kidney injury) (HCC) 06/10/2022   Acute deep vein thrombosis of left iliac vein (HCC) 05/18/2022   Current long-term use of anticoagulant medication with history of deep venous thrombosis 04/2022 (DVT)  05/18/2022   History of insertion of IVC (inferior vena caval) filter 2006 05/18/2022   Swelling of left foot 05/18/2022   DVT (deep venous thrombosis) (HCC) 05/05/2022   Influenza A with pneumonia 05/02/2022   Protein-calorie malnutrition, severe (HCC) 04/28/2022   Closed left hip fracture, initial encounter (HCC) 04/25/2022   Memory loss 12/10/2021   Bell palsy 12/10/2021   Acute bronchitis 10/21/2021   Dyslipidemia 04/11/2020   Abdominal enlargement 04/05/2018   Stage 3b chronic kidney disease (CKD) (HCC) 11/30/2017   Leg wound, right 08/08/2015   Gastroenteritis, acute  04/22/2015   OAB (overactive bladder) 04/22/2015   Leg wound, left 11/01/2014   Edema 11/01/2014   Seborrheic dermatitis 09/26/2013   Dysphagia, unspecified(787.20) 01/23/2013   Syncope 10/03/2012   Well adult exam 09/22/2012   Weight gain 09/22/2012   Cystitis 09/22/2012   Nonspecific abnormal electrocardiogram (ECG) (EKG) 09/22/2012   Osteoarthritis 05/19/2012   Leg pain, bilateral 01/13/2012   Chest pain 09/10/2011   Glaucoma 09/10/2011   Dysuria 07/17/2009   Diabetic nephropathy (HCC) 06/27/2008   Uncontrolled type 2 diabetes mellitus with hyperglycemia, without long-term current use of insulin (HCC) 04/27/2007   HTN (hypertension) 04/27/2007   GERD 04/27/2007   PCP:  Tresa Garter, MD Pharmacy:   [facility] [phone] - [place], [place] - [address] AT [facility] OF [address] [place] [address] Phone: [phone] Fax: [phone]  [facility] - [place] - [address] [place] [address] Phone: [phone] Fax: [phone]  [id number] [place],  - [address] AT [facility] [address] [place] [address] Phone: [phone] Fax: [phone]     Social Determinants of Health (SDOH) Social History: SDOH Screenings   Food Insecurity: Patient Unable To Answer (01/30/2023)  Housing: Patient Declined (01/30/2023)  Transportation Needs: No Transportation Needs (01/30/2023)  Utilities: Not At Risk (01/30/2023)  Alcohol Screen: Low Risk  (03/24/2021)  Depression (PHQ2-9): Low Risk  (08/26/2022)  Financial Resource Strain: Low Risk  (12/15/2022)  Physical Activity: Unknown (12/15/2022)  Social Connections: Socially Isolated (12/15/2022)  Stress: No Stress Concern Present (12/15/2022)  Tobacco Use: Low Risk  (01/30/2023)   SDOH Interventions:     Readmission Risk Interventions    06/12/2022   11:19 AM 05/20/2022    4:35  PM  Readmission Risk Prevention Plan  Transportation Screening Complete Complete  PCP or Specialist Appt within 3-5 Days Complete Complete  HRI or Home Care Consult Complete --  Social Work Consult for Recovery Care Planning/Counseling Complete Complete  Palliative Care Screening Not Applicable Not Applicable  Medication Review Oceanographer) Complete Complete

## 2023-01-30 NOTE — Plan of Care (Signed)
Problem: Education: Goal: Verbalization of understanding the information provided (i.e., activity precautions, restrictions, etc) will improve Outcome: Progressing Goal: Individualized Educational Video(s) Outcome: Progressing   Problem: Activity: Goal: Ability to ambulate and perform ADLs will improve Outcome: Progressing   Problem: Clinical Measurements: Goal: Postoperative complications will be avoided or minimized Outcome: Progressing   Problem: Self-Concept: Goal: Ability to maintain and perform role responsibilities to the fullest extent possible will improve Outcome: Progressing   Problem: Pain Management: Goal: Pain level will decrease Outcome: Progressing   Problem: Education: Goal: Ability to describe self-care measures that may prevent or decrease complications (Diabetes Survival Skills Education) will improve Outcome: Progressing Goal: Individualized Educational Video(s) Outcome: Progressing   Problem: Coping: Goal: Ability to adjust to condition or change in health will improve Outcome: Progressing   Problem: Fluid Volume: Goal: Ability to maintain a balanced intake and output will improve Outcome: Progressing   Problem: Health Behavior/Discharge Planning: Goal: Ability to identify and utilize available resources and services will improve Outcome: Progressing Goal: Ability to manage health-related needs will improve Outcome: Progressing   Problem: Metabolic: Goal: Ability to maintain appropriate glucose levels will improve Outcome: Progressing   Problem: Nutritional: Goal: Maintenance of adequate nutrition will improve Outcome: Progressing Goal: Progress toward achieving an optimal weight will improve Outcome: Progressing   Problem: Skin Integrity: Goal: Risk for impaired skin integrity will decrease Outcome: Progressing   Problem: Tissue Perfusion: Goal: Adequacy of tissue perfusion will improve Outcome: Progressing   Problem:  Education: Goal: Knowledge of General Education information will improve Description: Including pain rating scale, medication(s)/side effects and non-pharmacologic comfort measures Outcome: Progressing   Problem: Health Behavior/Discharge Planning: Goal: Ability to manage health-related needs will improve Outcome: Progressing   Problem: Clinical Measurements: Goal: Ability to maintain clinical measurements within normal limits will improve Outcome: Progressing Goal: Will remain free from infection Outcome: Progressing Goal: Diagnostic test results will improve Outcome: Progressing Goal: Respiratory complications will improve Outcome: Progressing Goal: Cardiovascular complication will be avoided Outcome: Progressing   Problem: Activity: Goal: Risk for activity intolerance will decrease Outcome: Progressing   Problem: Nutrition: Goal: Adequate nutrition will be maintained Outcome: Progressing   Problem: Coping: Goal: Level of anxiety will decrease Outcome: Progressing   Problem: Elimination: Goal: Will not experience complications related to bowel motility Outcome: Progressing Goal: Will not experience complications related to urinary retention Outcome: Progressing   Problem: Pain Managment: Goal: General experience of comfort will improve Outcome: Progressing   Problem: Safety: Goal: Ability to remain free from injury will improve Outcome: Progressing   Problem: Skin Integrity: Goal: Risk for impaired skin integrity will decrease Outcome: Progressing

## 2023-01-30 NOTE — ED Notes (Signed)
PT will not keep purewick in place, removes it continuously with her hands causing her to stay soaked. Tech (Thuy)was notified of PT needing to be changed
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
## 2023-01-30 NOTE — Assessment & Plan Note (Addendum)
-   Elevated - Continue home antihypertensive

## 2023-01-30 NOTE — Progress Notes (Signed)
Patient seen and examined.  Admitted by nighttime hospitalist overnight hours.  See H&P assessment plan.  Patient was so sleepy and difficult to awake but she looks comfortable.  Admitted with suspected UTI, there was questionable ischemic leg and this was ruled out by vascular surgery.  Remains on antibiotics.  Will start mobilizing with PT OT.  Same-day admit.  No charge visit.
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
## 2023-01-31 DIAGNOSIS — A419 Sepsis, unspecified organism: Secondary | ICD-10-CM | POA: Diagnosis not present

## 2023-01-31 DIAGNOSIS — N390 Urinary tract infection, site not specified: Secondary | ICD-10-CM | POA: Diagnosis not present

## 2023-01-31 LAB — GLUCOSE, CAPILLARY
Glucose-Capillary: 112 mg/dL — ABNORMAL HIGH (ref 70–99)
Glucose-Capillary: 116 mg/dL — ABNORMAL HIGH (ref 70–99)

## 2023-01-31 MED ORDER — SODIUM CHLORIDE 0.9 % IV SOLN
1.0000 g | Freq: Once | INTRAVENOUS | Status: AC
  Administered 2023-01-31: 1 g via INTRAVENOUS
  Filled 2023-01-31: qty 10

## 2023-01-31 MED ORDER — CEFADROXIL 500 MG PO CAPS: 500.0000 mg | ORAL_CAPSULE | Freq: Two times a day (BID) | ORAL | 0 refills | Status: AC

## 2023-01-31 MED ORDER — POTASSIUM CHLORIDE CRYS ER 20 MEQ PO TBCR
20.0000 meq | EXTENDED_RELEASE_TABLET | Freq: Once | ORAL | Status: AC
  Administered 2023-01-31: 20 meq via ORAL
  Filled 2023-01-31: qty 1

## 2023-01-31 MED ORDER — ACETAMINOPHEN 500 MG PO TABS: 1000.0000 mg | ORAL_TABLET | Freq: Three times a day (TID) | ORAL | 0 refills | Status: DC | PRN

## 2023-01-31 MED ORDER — LACTATED RINGERS IV SOLN: INTRAVENOUS | Status: DC

## 2023-01-31 MED ORDER — ACETAMINOPHEN 325 MG PO TABS: 650.0000 mg | ORAL_TABLET | Freq: Four times a day (QID) | ORAL | Status: DC | PRN

## 2023-01-31 NOTE — Evaluation (Signed)
Occupational Therapy Evaluation Patient Details Name: Brandy Gonzales MRN: [id number] DOB: 1941/02/07 Today's Date: 01/31/2023   History of Present Illness Brandy Gonzales is a 82 y.o. female  who presented with confusion and left leg pain. Admitted with suspected UTI. PMHx: HTN, DVT,  IVC filter (2006), T2DM,CKD3b, cognitive impairment   Clinical Impression   Brandy Gonzales was evaluated s/p the above admission list. Per her daughter, Brandy Gonzales has been refusing to get OOB for ~1 month and they have been caring for her at bed level. Upon evaluation the pt was limited by lethargy, confusion, weakness, "side pain," unsteady gait and poor activity tolerance. Overall she needed max-total A for bed mobility, max A with RW for 4x standing attempts and was unable to progress to weight shifting or stepping. Due to the deficits listed below the pt also needs max-total A for all aspects of her care. Pt will benefit from continued acute OT services and HHOT with 24/7 dependent care from family with needed DME: hospital bed, hoyer lift, WC.        If plan is discharge home, recommend the following: A lot of help with walking and/or transfers;Two people to help with walking and/or transfers;A lot of help with bathing/dressing/bathroom;Two people to help with bathing/dressing/bathroom;Assistance with cooking/housework;Assistance with feeding;Direct supervision/assist for medications management;Direct supervision/assist for financial management;Assist for transportation;Supervision due to cognitive status;Help with stairs or ramp for entrance    Functional Status Assessment  Patient has had a recent decline in their functional status and/or demonstrates limited ability to make significant improvements in function in a reasonable and predictable amount of time  Equipment Recommendations  Hoyer lift;Hospital bed;Wheelchair (measurements OT);Wheelchair cushion (measurements OT)    Recommendations for Other Services        Precautions / Restrictions Precautions Precautions: Fall Restrictions Weight Bearing Restrictions: No      Mobility Bed Mobility Overal bed mobility: Needs Assistance Bed Mobility: Supine to Sit, Sit to Supine     Supine to sit: Max assist Sit to supine: Total assist        Transfers Overall transfer level: Needs assistance Equipment used: Rolling walker (2 wheels) Transfers: Sit to/from Stand Sit to Stand: Max assist           General transfer comment: unable to weight shift or take steps once standing      Balance Overall balance assessment: Needs assistance Sitting-balance support: Feet supported Sitting balance-Leahy Scale: Poor     Standing balance support: Bilateral upper extremity supported, During functional activity Standing balance-Leahy Scale: Poor Standing balance comment: tolerated standing for 1-5 seconds at a time                           ADL either performed or assessed with clinical judgement   ADL Overall ADL's : Needs assistance/impaired Eating/Feeding: Maximal assistance   Grooming: Maximal assistance   Upper Body Bathing: Total assistance   Lower Body Bathing: Total assistance   Upper Body Dressing : Maximal assistance   Lower Body Dressing: Total assistance   Toilet Transfer: Total assistance   Toileting- Clothing Manipulation and Hygiene: Total assistance       Functional mobility during ADLs: Maximal assistance General ADL Comments: maximal cues needed for pt to attempt assisting with care     Vision Baseline Vision/History: 1 Wears glasses Vision Assessment?: No apparent visual deficits     Perception Perception: Not tested       Praxis Praxis: Not tested  Pertinent Vitals/Pain       Extremity/Trunk Assessment Upper Extremity Assessment Upper Extremity Assessment: Generalized weakness   Lower Extremity Assessment Lower Extremity Assessment: Generalized weakness   Cervical / Trunk  Assessment Cervical / Trunk Assessment: Kyphotic   Communication Communication Communication: Hearing impairment   Cognition Arousal: Alert Behavior During Therapy: Flat affect Overall Cognitive Status: History of cognitive impairments - at baseline                                 General Comments: memory impairment at baseline, pt was able to state her name, otherwise did not attempt to answer orientation questions. Needs frequent cues to participate in any task     General Comments  VSS on RA, pt has scratch marks on her lower back. RN make aware            Home Living Family/patient expects to be discharged to:: Private residence Living Arrangements: Children Available Help at Discharge: Family;Available 24 hours/day Type of Home: House Home Access: Ramped entrance     Home Layout: One level     Bathroom Shower/Tub: Tub/shower unit;Sponge bathes at baseline   Bathroom Toilet: Handicapped height     Home Equipment: Agricultural consultant (2 wheels);Rollator (4 wheels);Standard Walker;Cane - single point;BSC/3in1          Prior Functioning/Environment Prior Level of Function : Patient poor historian/Family not available             Mobility Comments: per pt's daughter, she has refused to get OOB for about 1 month ADLs Comments: daughter completes pt's ADLs at bed level, pts assist level varies        OT Problem List: Decreased strength;Decreased range of motion;Decreased activity tolerance;Impaired balance (sitting and/or standing);Decreased cognition;Decreased safety awareness;Decreased knowledge of use of DME or AE;Decreased knowledge of precautions;Pain      OT Treatment/Interventions: Self-care/ADL training;Therapeutic exercise;DME and/or AE instruction;Therapeutic activities;Patient/family education;Balance training    OT Goals(Current goals can be found in the care plan section) Acute Rehab OT Goals Patient Stated Goal: did not state OT Goal  Formulation: With patient Time For Goal Achievement: 02/14/23 Potential to Achieve Goals: Good ADL Goals Pt Will Perform Grooming: with min assist;sitting Pt Will Perform Upper Body Dressing: with min assist;sitting Pt Will Perform Lower Body Dressing: with mod assist;sit to/from stand Pt Will Transfer to Toilet: with mod assist;bedside commode;stand pivot transfer  OT Frequency: Min 1X/week       AM-PAC OT "6 Clicks" Daily Activity     Outcome Measure Help from another person eating meals?: A Lot Help from another person taking care of personal grooming?: A Lot Help from another person toileting, which includes using toliet, bedpan, or urinal?: Total Help from another person bathing (including washing, rinsing, drying)?: Total Help from another person to put on and taking off regular upper body clothing?: A Lot Help from another person to put on and taking off regular lower body clothing?: Total 6 Click Score: 9   End of Session Equipment Utilized During Treatment: Rolling walker (2 wheels) Nurse Communication: Mobility status  Activity Tolerance: Patient tolerated treatment well Patient left: in bed;with call bell/phone within reach;with bed alarm set  OT Visit Diagnosis: Unsteadiness on feet (R26.81);Other abnormalities of gait and mobility (R26.89);Muscle weakness (generalized) (M62.81);Pain                Time: 1110-1130 OT Time Calculation (min): 20 min Charges:  OT General Charges $  OT Visit: 1 Visit OT Evaluation $OT Eval Moderate Complexity: 1 Mod  Derenda Mis, OTR/L Acute Rehabilitation Services Office [phone] Secure Chat Communication Preferred   Donia Pounds 01/31/2023, 11:49 AM

## 2023-01-31 NOTE — Discharge Summary (Signed)
Physician Discharge Summary  Brandy Gonzales YWV:[id number] DOB: [birth year] DOA: 01/29/2023  PCP: Tresa Garter, MD  Admit date: 01/29/2023 Discharge date: 01/31/2023  Admitted From: Home Disposition: Home  Recommendations for Outpatient Follow-up:  Follow up with PCP in 1-2 weeks Please obtain BMP/CBC in one week   Home Health: PT Equipment/Devices: Hospital bed  Discharge Condition: Fair CODE STATUS: Full code Diet recommendation: Low-salt and low-carb diet, nutritional supplements  Discharge summary: 82 year old female with history of hypertension, history of DVT status post IVC filter, type 2 diabetes on glipizide, CKD stage IIIb, cognitive impairment who was brought to the hospital by family with confusion and left leg pain.  Patient with recently worsening symptoms, poor mobility.  In the emergency room, she was found to have abnormal urine analysis.  Tachycardic, leukocytosis 12.3, blood pressures 180.  She was found to have cold left lower extremity, seen by vascular and found to have adequate blood supply.  She was admitted to the hospital treated for UTI and acute metabolic encephalopathy secondary to UTI.  Acute UTI present on admission, acute metabolic encephalopathy secondary to UTI.  Recent cognitive decline.  Multiple medical problems.  Probably early dementia. Sepsis present on admission secondary to tachycardia and leukocytosis and presence of UTI.  Patient was treated with IV fluids, she was treated with Rocephin.  Blood cultures negative.  Urine culture with E. coli.  Clinical improvement today.  Sensitivity available.  Will treat with cefadroxil for 5 additional days to complete 7 days of therapy. Patient does have very poor mobility. Patient has left foot pain, previous injury and healed hematoma which is likely causing interference to her mobility. Advised to use Tylenol 1 g 3 times daily as needed. Patient is clinically improved, she has adequate support system  at home.  She will go home, will prescribe home health PT.  She will also need more DME including hospital bed due to progressive immobility. Stable for discharge with family.   Discharge Diagnoses:  Principal Problem:   Sepsis secondary to UTI [facility]) Active Problems:   Uncontrolled type 2 diabetes mellitus with hyperglycemia, without long-term current use of insulin (HCC)   HTN (hypertension)   Stage 3b chronic kidney disease (CKD) (HCC)   Cognitive impairment   Contact dermatitis    Discharge Instructions  Discharge Instructions     Diet - low sodium heart healthy   Complete by: As directed    Diet Carb Modified   Complete by: As directed    Increase activity slowly   Complete by: As directed       Allergies as of 01/31/2023       Reactions   Amlodipine Besylate Other (See Comments)   Hair loss   Atenolol Other (See Comments)   Fluid retention   Oxycodone Hcl Other (See Comments)   Dizziness   Propoxyphene N-acetaminophen Other (See Comments)   Unknown reaction   Enalapril Maleate Palpitations        Medication List     TAKE these medications    acetaminophen 500 MG tablet Commonly known as: TYLENOL Take 2 tablets (1,000 mg total) by mouth every 8 (eight) hours as needed for mild pain, moderate pain, fever or headache. What changed:  how much to take when to take this reasons to take this   aspirin EC 81 MG tablet Take 81 mg by mouth at bedtime. Swallow whole.   carvedilol 25 MG tablet Commonly known as: COREG Take 1 tablet (25 mg total) by mouth 2 (  two) times daily with a meal.   cefadroxil 500 MG capsule Commonly known as: DURICEF Take 1 capsule (500 mg total) by mouth 2 (two) times daily for 5 days.   cholecalciferol 25 MCG (1000 UNIT) tablet Commonly known as: VITAMIN D3 Take 1,000 Units by mouth daily.   cyanocobalamin 1000 MCG tablet Take 1 tablet (1,000 mcg total) by mouth daily.   folic acid 1 MG tablet Commonly known as:  FOLVITE Take 1 tablet (1 mg total) by mouth daily.   glimepiride 2 MG tablet Commonly known as: AMARYL Take 1 tablet (2 mg total) by mouth daily before breakfast.   omeprazole 20 MG capsule Commonly known as: PRILOSEC Take 1 capsule (20 mg total) by mouth daily.   thiamine 100 MG tablet Commonly known as: Vitamin B-1 Take 1 tablet (100 mg total) by mouth daily.   torsemide 20 MG tablet Commonly known as: DEMADEX Take 20 mg by mouth 3 (three) times a week. 1 tablet M-W-F for edema        Allergies  Allergen Reactions   Amlodipine Besylate Other (See Comments)    Hair loss   Atenolol Other (See Comments)    Fluid retention   Oxycodone Hcl Other (See Comments)    Dizziness    Propoxyphene N-Acetaminophen Other (See Comments)    Unknown reaction   Enalapril Maleate Palpitations    Consultations: None   Procedures/Studies: DG Tibia/Fibula Left  Result Date: 01/29/2023 CLINICAL DATA:  Right lower leg pain that has been going on while EXAM: LEFT TIBIA AND FIBULA - 2 VIEW COMPARISON:  X-ray and CT scan 02/21/2022. FINDINGS: There has been extensive hardware placement. Intramedullary rod in place along the tibia. Three proximal and 3 distal fixation screws. Chronic appearing posttraumatic deformity with callus along the midshaft of the tibia. Stable protruding component of callus anteriorly along the distal diaphysis. There also 2 screws obliquely seen along the medial malleolus. Lateral fixation plates seen with numerous screws along the mid to distal shaft of the fibula. No obvious hardware failure. On this limited portable views the more distal of the 3 proximal tibial fixation screws is not quite flush with the adjacent bone but no significant surrounding lucency along the screw in the projections are different than the prior x-ray. The appearance is more similar to the CT image of 02/21/2022. No acute fracture or dislocation. Osteopenia. Extensive vascular calcifications and  other soft tissue calcifications about the knee and proximal tibia and fibula. IMPRESSION: Extensive hardware along the tibia and fibula. Old, traumatic bony deformity identified along the tibia greater than fibula. Osteopenia with chronic changes. Please correlate with particular symptoms and location. Electronically Signed   By: Karen Kays M.D.   On: 01/29/2023 18:43   DG Ribs Unilateral W/Chest Right  Result Date: 01/29/2023 CLINICAL DATA:  Rib pain that has been going on for a while. Confusion. No reported history of trauma EXAM: RIGHT RIBS AND CHEST - 3 VIEW COMPARISON:  Chest x-ray 10/14/2022 FINDINGS: No consolidation, pneumothorax or effusion. No edema. Normal cardiopericardial silhouette. Tortuous and ectatic aorta. Osteopenia with degenerative changes. No rib fracture seen by x-ray. The reviews are essentially AP. No significant oblique. Is also some elevation of the right humeral head relative to the acromion. Please correlate for rotator cuff tear IMPRESSION: Limited rib views. Osteopenia. No definite rib fracture by x-ray. No pneumothorax or effusion. Electronically Signed   By: Karen Kays M.D.   On: 01/29/2023 18:38   (Echo, Carotid, EGD, Colonoscopy, ERCP)  Subjective: Patient seen in the morning rounds.  She has some pain on her left leg.  Denies any other complaints.  Alert awake.  Called and discussed with patient's daughter on the phone.   Discharge Exam: Vitals:   01/30/23 2002 01/31/23 0811  BP: (!) 161/70 (!) 175/67  Pulse: 76 62  Resp: 16 17  Temp: 98.5 F (36.9 C) 98.2 F (36.8 C)  SpO2: 100% 100%   Vitals:   01/30/23 1330 01/30/23 1655 01/30/23 2002 01/31/23 0811  BP: (!) 154/87 (!) 129/57 (!) 161/70 (!) 175/67  Pulse:  87 76 62  Resp:  16 16 17   Temp:  97.8 F (36.6 C) 98.5 F (36.9 C) 98.2 F (36.8 C)  TempSrc:  Oral Oral Oral  SpO2:  100% 100% 100%  Weight:      Height:        General: Pt is alert, awake, not in acute distress Flat affect.   Mildly anxious. Cardiovascular: RRR, S1/S2 +, no rubs, no gallops Respiratory: CTA bilaterally, no wheezing, no rhonchi Abdominal: Soft, NT, ND, bowel sounds + Extremities:  Patient with no palpable pulse on the left dorsalis pedis.  Temperature normal.  She has pain on palpation around the dorsum of the feet with previous injury and healed hematoma.  There is 1+ pedal edema.    The results of significant diagnostics from this hospitalization (including imaging, microbiology, ancillary and laboratory) are listed below for reference.     Microbiology: Recent Results (from the past 240 hour(s))  Urine Culture     Status: Abnormal (Preliminary result)   Collection Time: 01/29/23  8:14 PM   Specimen: Urine, Random  Result Value Ref Range Status   Specimen Description URINE, RANDOM  Final   Special Requests NONE Reflexed from [id number]  Final   Culture (A)  Final    >=100,000 COLONIES/mL ESCHERICHIA COLI 30,000 COLONIES/mL PROTEUS MIRABILIS SUSCEPTIBILITIES TO FOLLOW    Report Status PENDING  Incomplete   Organism ID, Bacteria ESCHERICHIA COLI (A)  Final      Susceptibility   Escherichia coli - MIC*    AMPICILLIN >=32 RESISTANT Resistant     CEFAZOLIN <=4 SENSITIVE Sensitive     CEFEPIME <=0.12 SENSITIVE Sensitive     CEFTRIAXONE <=0.25 SENSITIVE Sensitive     CIPROFLOXACIN <=0.25 SENSITIVE Sensitive     GENTAMICIN <=1 SENSITIVE Sensitive     IMIPENEM <=0.25 SENSITIVE Sensitive     NITROFURANTOIN <=16 SENSITIVE Sensitive     TRIMETH/SULFA >=320 RESISTANT Resistant     AMPICILLIN/SULBACTAM 16 INTERMEDIATE Intermediate     PIP/TAZO Value in next row Sensitive      <=4 SENSITIVEPerformed at [facility], [address], [place], [place] [address]    * >=100,000 COLONIES/mL ESCHERICHIA COLI  Culture, blood (single)     Status: None (Preliminary result)   Collection Time: 01/30/23  1:38 AM   Specimen: BLOOD  Result Value Ref Range Status   Specimen Description BLOOD BLOOD LEFT  HAND  Final   Special Requests   Final    BOTTLES DRAWN AEROBIC AND ANAEROBIC Blood Culture results may not be optimal due to an excessive volume of blood received in culture bottles   Culture   Final    NO GROWTH 1 DAY Performed at [facility], [address], [place], [place] [address]    Report Status PENDING  Incomplete     Labs: BNP (last 3 results) Recent Labs    08/07/22 1625 10/15/22 3244  BNP 354.8* 162.0*   Basic Metabolic Panel: Recent Labs  Lab 01/29/23 1352 01/30/23 0138  NA 140 143  K 3.6 3.4*  CL 110 115*  CO2 19* 17*  GLUCOSE 301* 230*  BUN 43* 34*  CREATININE 1.61* 1.33*  CALCIUM 9.6 9.4   Liver Function Tests: Recent Labs  Lab 01/29/23 1352  AST 32  ALT 37  ALKPHOS 152*  BILITOT 0.9  PROT 6.3*  ALBUMIN 3.3*   No results for input(s): "LIPASE", "AMYLASE" in the last 168 hours. No results for input(s): "AMMONIA" in the last 168 hours. CBC: Recent Labs  Lab 01/29/23 1352 01/30/23 0138  WBC 12.3* 10.7*  HGB 12.9 11.1*  HCT 39.2 33.8*  MCV 93.8 92.6  PLT 253 215   Cardiac Enzymes: No results for input(s): "CKTOTAL", "CKMB", "CKMBINDEX", "TROPONINI" in the last 168 hours. BNP: Invalid input(s): "POCBNP" CBG: Recent Labs  Lab 01/30/23 0846 01/30/23 1331 01/30/23 1657 01/30/23 1954 01/31/23 0812  GLUCAP 179* 164* 185* 132* 112*   D-Dimer No results for input(s): "DDIMER" in the last 72 hours. Hgb A1c Recent Labs    01/30/23 0138  HGBA1C 6.0*   Lipid Profile No results for input(s): "CHOL", "HDL", "LDLCALC", "TRIG", "CHOLHDL", "LDLDIRECT" in the last 72 hours. Thyroid function studies No results for input(s): "TSH", "T4TOTAL", "T3FREE", "THYROIDAB" in the last 72 hours.  Invalid input(s): "FREET3" Anemia work up No results for input(s): "VITAMINB12", "FOLATE", "FERRITIN", "TIBC", "IRON", "RETICCTPCT" in the last 72 hours. Urinalysis    Component Value Date/Time   COLORURINE YELLOW 01/29/2023 2014   APPEARANCEUR  CLOUDY (A) 01/29/2023 2014   LABSPEC 1.012 01/29/2023 2014   PHURINE 5.0 01/29/2023 2014   GLUCOSEU 150 (A) 01/29/2023 2014   GLUCOSEU NEGATIVE 10/21/2021 1404   HGBUR SMALL (A) 01/29/2023 2014   BILIRUBINUR NEGATIVE 01/29/2023 2014   KETONESUR NEGATIVE 01/29/2023 2014   PROTEINUR NEGATIVE 01/29/2023 2014   UROBILINOGEN 0.2 10/21/2021 1404   NITRITE NEGATIVE 01/29/2023 2014   LEUKOCYTESUR LARGE (A) 01/29/2023 2014   Sepsis Labs Recent Labs  Lab 01/29/23 1352 01/30/23 0138  WBC 12.3* 10.7*   Microbiology Recent Results (from the past 240 hour(s))  Urine Culture     Status: Abnormal (Preliminary result)   Collection Time: 01/29/23  8:14 PM   Specimen: Urine, Random  Result Value Ref Range Status   Specimen Description URINE, RANDOM  Final   Special Requests NONE Reflexed from [id number]  Final   Culture (A)  Final    >=100,000 COLONIES/mL ESCHERICHIA COLI 30,000 COLONIES/mL PROTEUS MIRABILIS SUSCEPTIBILITIES TO FOLLOW    Report Status PENDING  Incomplete   Organism ID, Bacteria ESCHERICHIA COLI (A)  Final      Susceptibility   Escherichia coli - MIC*    AMPICILLIN >=32 RESISTANT Resistant     CEFAZOLIN <=4 SENSITIVE Sensitive     CEFEPIME <=0.12 SENSITIVE Sensitive     CEFTRIAXONE <=0.25 SENSITIVE Sensitive     CIPROFLOXACIN <=0.25 SENSITIVE Sensitive     GENTAMICIN <=1 SENSITIVE Sensitive     IMIPENEM <=0.25 SENSITIVE Sensitive     NITROFURANTOIN <=16 SENSITIVE Sensitive     TRIMETH/SULFA >=320 RESISTANT Resistant     AMPICILLIN/SULBACTAM 16 INTERMEDIATE Intermediate     PIP/TAZO Value in next row Sensitive      <=4 SENSITIVEPerformed at [facility], [address] [place], [place] [address]    * >=100,000 COLONIES/mL ESCHERICHIA COLI  Culture, blood (single)     Status: None (Preliminary result)   Collection Time:  01/30/23  1:38 AM   Specimen: BLOOD  Result Value Ref Range Status   Specimen Description BLOOD BLOOD LEFT HAND  Final   Special Requests   Final     BOTTLES DRAWN AEROBIC AND ANAEROBIC Blood Culture results may not be optimal due to an excessive volume of blood received in culture bottles   Culture   Final    NO GROWTH 1 DAY Performed at [facility], [address] [place], [place] [address]    Report Status PENDING  Incomplete     Time coordinating discharge: 35 minutes  SIGNED:   Dorcas Carrow, MD  [facility] 01/31/2023, 10:15 AM

## 2023-01-31 NOTE — Progress Notes (Signed)
    Durable Medical Equipment  (From admission, onward)           Start     Ordered   01/31/23 1019  For home use only DME Hospital bed  Once       Comments: Therapeutic mattress  Question Answer Comment  Length of Need Lifetime   Patient has (list medical condition): debility, weakness   The above medical condition requires: Patient requires the ability to reposition frequently   Bed type Semi-electric   Hoyer Lift Yes      01/31/23 1019

## 2023-01-31 NOTE — TOC Transition Note (Addendum)
Transition of Care [facility]) - CM/SW Discharge Note   Patient Details  Name: Brandy Gonzales MRN: [id number] Date of Birth: April 26, 1941  Transition of Care [facility]) CM/SW Contact:  Lawerance Sabal, RN Phone Number: 01/31/2023, 10:27 AM   Clinical Narrative:     Hospital bed and hoyer  to be delivered to the home tomorrow through Daylene Posey agreeable, [facility] set up through [employer] per Donna's request. [name] will be in after noon to take the patient home.  WC to be delivered to the room 12:50 [employer] does not have WC in stock, [employer] states that she has been supplied w a RW in the last 5 years and the [facility] will be OOP. I called [name] her daughter and she would prefer to order one herself from [place], as it will be less expensive. Updated nurse that [facility] will not be delivered to the room.           Patient Goals and CMS Choice      Discharge Placement                         Discharge Plan and Services Additional resources added to the After Visit Summary for                                       Social Determinants of Health (SDOH) Interventions SDOH Screenings   Food Insecurity: Patient Unable To Answer (01/30/2023)  Housing: Patient Declined (01/30/2023)  Transportation Needs: No Transportation Needs (01/30/2023)  Utilities: Not At Risk (01/30/2023)  Alcohol Screen: Low Risk  (03/24/2021)  Depression (PHQ2-9): Low Risk  (08/26/2022)  Financial Resource Strain: Low Risk  (12/15/2022)  Physical Activity: Unknown (12/15/2022)  Social Connections: Socially Isolated (12/15/2022)  Stress: No Stress Concern Present (12/15/2022)  Tobacco Use: Low Risk  (01/30/2023)     Readmission Risk Interventions    06/12/2022   11:19 AM 05/20/2022    4:35 PM  Readmission Risk Prevention Plan  Transportation Screening Complete Complete  PCP or Specialist Appt within 3-5 Days Complete Complete  HRI or Home Care Consult Complete --  Social Work Consult for Recovery Care Planning/Counseling Complete  Complete  Palliative Care Screening Not Applicable Not Applicable  Medication Review Oceanographer) Complete Complete

## 2023-01-31 NOTE — Progress Notes (Signed)
    Durable Medical Equipment  (From admission, onward)           Start     Ordered   01/31/23 1141  For home use only DME lightweight manual wheelchair with seat cushion  Once       Comments: Patient suffers from dementia, leg pain  which impairs their ability to perform daily activities like bathing, dressing, and toileting in the home.  A walker will not resolve  issue with performing activities of daily living. A wheelchair will allow patient to safely perform daily activities. Patient is not able to propel themselves in the home using a standard weight wheelchair due to general weakness. Patient can self propel in the lightweight wheelchair. Length of need Lifetime. Accessories: elevating leg rests (ELRs), wheel locks, extensions and anti-tippers.   01/31/23 1140   01/31/23 1019  For home use only DME Hospital bed  Once       Comments: Therapeutic mattress  Question Answer Comment  Length of Need Lifetime   Patient has (list medical condition): debility, weakness   The above medical condition requires: Patient requires the ability to reposition frequently   Bed type Semi-electric   Hoyer Lift Yes      01/31/23 1019

## 2023-02-01 LAB — URINE CULTURE: Culture: >=100000 — AB

## 2023-02-02 ENCOUNTER — Telehealth: Payer: Self-pay

## 2023-02-02 NOTE — Transitions of Care (Post Inpatient/ED Visit) (Signed)
02/02/2023  Name: Brandy Gonzales MRN: [id number] DOB: 1940/07/05  Today's TOC FU Call Status: Today's TOC FU Call Status:: Unsuccessful Call (1st Attempt) Unsuccessful Call (1st Attempt) Date: 02/02/23  Attempted to reach the patient regarding the most recent Inpatient/ED visit.  Follow Up Plan: Additional outreach attempts will be made to reach the patient to complete the Transitions of Care (Post Inpatient/ED visit) call.   Signature : [place], [place]

## 2023-02-03 ENCOUNTER — Telehealth: Payer: Self-pay
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
## 2023-02-03 NOTE — Transitions of Care (Post Inpatient/ED Visit) (Signed)
02/03/2023  Name: Brandy Gonzales MRN: [id number] DOB: [birth year]  Today's TOC FU Call Status: Today's TOC FU Call Status:: Unsuccessful Call (2nd Attempt) Unsuccessful Call (2nd Attempt) Date: 02/03/23  Attempted to reach the patient regarding the most recent Inpatient/ED visit.  Follow Up Plan: Additional outreach attempts will be made to reach the patient to complete the Transitions of Care (Post Inpatient/ED visit) call.   Signature: [place], [place]

## 2023-02-03 NOTE — Transitions of Care (Post Inpatient/ED Visit) (Signed)
02/03/2023  Name: Brandy Gonzales MRN: [id number] DOB: Oct 23, 1940  Today's TOC FU Call Status:   Patient's Name and Date of Birth confirmed.  Transition Care Management Follow-up Telephone Call Date of Discharge: 01/31/23 Discharge Facility: [facility]) Type of Discharge: Emergency Department Reason for ED Visit: Other: How have you been since you were released from the hospital?: Better Any questions or concerns?: No (he mental state isn't where it should b. Patients daughter states that her mom is almost like a kid again.)  Items Reviewed: Did you receive and understand the discharge instructions provided?: Yes Medications obtained,verified, and reconciled?: Yes (Medications Reviewed) Any new allergies since your discharge?: No Dietary orders reviewed?: NA Do you have support at home?: Yes People in Home: grandchild(ren) Name of Support/Comfort Primary Source: Larwance Sachs and Parks Ranger  Medications Reviewed Today: Medications Reviewed Today   Medications were not reviewed in this encounter     Home Care and Equipment/Supplies: Were Home Health Services Ordered?: Yes Name of Home Health Agency:: [facility] ([facility]) Has Agency set up a time to come to your home?: Yes First Home Health Visit Date: 02/08/23 Any new equipment or medical supplies ordered?: Yes Name of Medical supply agency?: [facility] Were you able to get the equipment/medical supplies?: Yes Do you have any questions related to the use of the equipment/supplies?: No  Functional Questionnaire: Do you need assistance with bathing/showering or dressing?: Yes (Family member helps) Do you need assistance with meal preparation?: Yes (family member helps) Do you need assistance with eating?: Yes (family member helps) Do you have difficulty maintaining continence: Yes (family member helps) Do you need assistance with getting out of bed/getting out of a chair/moving?: Yes (family members  help) Do you have difficulty managing or taking your medications?: Yes (family memeber helps)  Follow up appointments reviewed: PCP Follow-up appointment confirmed?: Yes Date of PCP follow-up appointment?: 02/09/23 Follow-up Provider: Dr. Daine Gip Follow-up appointment confirmed?: NA Do you understand care options if your condition(s) worsen?: Yes-patient verbalized understanding    SIGNATURE: Reather Laurence, RMA

## 2023-02-04 LAB — CULTURE, BLOOD (SINGLE): Culture: NO GROWTH

## 2023-02-08 ENCOUNTER — Telehealth: Payer: Self-pay | Admitting: Internal Medicine

## 2023-02-08 DIAGNOSIS — N1832 Chronic kidney disease, stage 3b: Secondary | ICD-10-CM | POA: Diagnosis not present

## 2023-02-08 DIAGNOSIS — Z7982 Long term (current) use of aspirin: Secondary | ICD-10-CM | POA: Diagnosis not present

## 2023-02-08 DIAGNOSIS — D500 Iron deficiency anemia secondary to blood loss (chronic): Secondary | ICD-10-CM | POA: Diagnosis not present

## 2023-02-08 DIAGNOSIS — Z86718 Personal history of other venous thrombosis and embolism: Secondary | ICD-10-CM | POA: Diagnosis not present

## 2023-02-08 DIAGNOSIS — B9620 Unspecified Escherichia coli [E. coli] as the cause of diseases classified elsewhere: Secondary | ICD-10-CM | POA: Diagnosis not present

## 2023-02-08 DIAGNOSIS — G510 Bell's palsy: Secondary | ICD-10-CM | POA: Diagnosis not present

## 2023-02-08 DIAGNOSIS — G9341 Metabolic encephalopathy: Secondary | ICD-10-CM | POA: Insufficient documentation

## 2023-02-08 DIAGNOSIS — E559 Vitamin D deficiency, unspecified: Secondary | ICD-10-CM | POA: Diagnosis not present

## 2023-02-08 DIAGNOSIS — L259 Unspecified contact dermatitis, unspecified cause: Secondary | ICD-10-CM | POA: Diagnosis not present

## 2023-02-08 DIAGNOSIS — E538 Deficiency of other specified B group vitamins: Secondary | ICD-10-CM | POA: Diagnosis not present

## 2023-02-08 DIAGNOSIS — K219 Gastro-esophageal reflux disease without esophagitis: Secondary | ICD-10-CM | POA: Diagnosis not present

## 2023-02-08 DIAGNOSIS — E1140 Type 2 diabetes mellitus with diabetic neuropathy, unspecified: Secondary | ICD-10-CM | POA: Diagnosis not present

## 2023-02-08 DIAGNOSIS — Z8781 Personal history of (healed) traumatic fracture: Secondary | ICD-10-CM | POA: Diagnosis not present

## 2023-02-08 DIAGNOSIS — Z95828 Presence of other vascular implants and grafts: Secondary | ICD-10-CM | POA: Diagnosis not present

## 2023-02-08 DIAGNOSIS — E1165 Type 2 diabetes mellitus with hyperglycemia: Secondary | ICD-10-CM | POA: Diagnosis not present

## 2023-02-08 DIAGNOSIS — I509 Heart failure, unspecified: Secondary | ICD-10-CM | POA: Diagnosis not present

## 2023-02-08 DIAGNOSIS — Z9181 History of falling: Secondary | ICD-10-CM | POA: Diagnosis not present

## 2023-02-08 DIAGNOSIS — E1122 Type 2 diabetes mellitus with diabetic chronic kidney disease: Secondary | ICD-10-CM | POA: Diagnosis not present

## 2023-02-08 DIAGNOSIS — Z7984 Long term (current) use of oral hypoglycemic drugs: Secondary | ICD-10-CM | POA: Diagnosis not present

## 2023-02-08 DIAGNOSIS — N390 Urinary tract infection, site not specified: Secondary | ICD-10-CM | POA: Diagnosis not present

## 2023-02-08 DIAGNOSIS — I130 Hypertensive heart and chronic kidney disease with heart failure and stage 1 through stage 4 chronic kidney disease, or unspecified chronic kidney disease: Secondary | ICD-10-CM | POA: Diagnosis not present

## 2023-02-08 DIAGNOSIS — R4189 Other symptoms and signs involving cognitive functions and awareness: Secondary | ICD-10-CM | POA: Diagnosis not present

## 2023-02-08 NOTE — Telephone Encounter (Signed)
HH ORDERS   Caller Name: [facility] Agency Name: [facility] Phone #: [phone](secure)  Service Requested: PT (examples: OT/PT/Skilled Nursing/Social Work/Speech Therapy/Wound Care)  Frequency of Visits: 2X a week for 1 week, 1X a week for 8 weeks

## 2023-02-09 ENCOUNTER — Encounter: Payer: Self-pay | Admitting: Internal Medicine

## 2023-02-09 ENCOUNTER — Ambulatory Visit: Payer: Medicare Other | Admitting: Internal Medicine

## 2023-02-09 VITALS — BP 110/60 | HR 68 | Temp 98.6°F | Ht 59.0 in

## 2023-02-09 DIAGNOSIS — R413 Other amnesia: Secondary | ICD-10-CM | POA: Diagnosis not present

## 2023-02-09 DIAGNOSIS — I10 Essential (primary) hypertension: Secondary | ICD-10-CM

## 2023-02-09 DIAGNOSIS — F02B18 Dementia in other diseases classified elsewhere, moderate, with other behavioral disturbance: Secondary | ICD-10-CM

## 2023-02-09 DIAGNOSIS — E1165 Type 2 diabetes mellitus with hyperglycemia: Secondary | ICD-10-CM | POA: Diagnosis not present

## 2023-02-09 DIAGNOSIS — Z7984 Long term (current) use of oral hypoglycemic drugs: Secondary | ICD-10-CM

## 2023-02-09 DIAGNOSIS — I129 Hypertensive chronic kidney disease with stage 1 through stage 4 chronic kidney disease, or unspecified chronic kidney disease: Secondary | ICD-10-CM

## 2023-02-09 DIAGNOSIS — N1832 Chronic kidney disease, stage 3b: Secondary | ICD-10-CM | POA: Diagnosis not present

## 2023-02-09 DIAGNOSIS — E1140 Type 2 diabetes mellitus with diabetic neuropathy, unspecified: Secondary | ICD-10-CM

## 2023-02-09 DIAGNOSIS — G301 Alzheimer's disease with late onset: Secondary | ICD-10-CM

## 2023-02-09 MED ORDER — CARVEDILOL 25 MG PO TABS: 12.5000 mg | ORAL_TABLET | Freq: Two times a day (BID) | ORAL | 1 refills | Status: DC

## 2023-02-09 MED ORDER — GLIMEPIRIDE 2 MG PO TABS: 1.0000 mg | ORAL_TABLET | Freq: Every day | ORAL | 1 refills | Status: DC

## 2023-02-09 MED ORDER — GVOKE HYPOPEN 1-PACK 1 MG/0.2ML ~~LOC~~ SOAJ: 0.2000 mL | Freq: Every day | SUBCUTANEOUS | 3 refills | Status: DC | PRN

## 2023-02-09 NOTE — Assessment & Plan Note (Signed)
Cont on Glimepiride 1 mg d due to low CBG Check A1c q 3 months Use protein drinks daily GVOKE pen prn

## 2023-02-09 NOTE — Assessment & Plan Note (Signed)
Reduce Coreg - 12.5 mg bid

## 2023-02-09 NOTE — Assessment & Plan Note (Signed)
Hydrate well 

## 2023-02-09 NOTE — Progress Notes (Unsigned)
Subjective:  Patient ID: Brandy Gonzales, female    DOB: 1941-04-22  Age: 82 y.o. MRN: [id number]  CC: Follow-up [facility] f/u, Memory loss concerns, discuss medications)   HPI TACARRA JANN presents for FTT, memory loss, weakness, UTI.  She is here with her daughter At the house now w/help Pt had low glu this past Sat S/p recent hospital stay:  " Expand All Collapse All  Physician Discharge Summary  TARRYN BRACKEN Gonzales:[id number] DOB: February 16, 1941 DOA: 01/29/2023   PCP: Brandy Garter, MD   Admit date: 01/29/2023 Discharge date: 01/31/2023   Admitted From: Home Disposition: Home   Recommendations for Outpatient Follow-up:  Follow up with PCP in 1-2 weeks Please obtain BMP/CBC in one week     Home Health: PT Equipment/Devices: Hospital bed   Discharge Condition: Fair CODE STATUS: Full code Diet recommendation: Low-salt and low-carb diet, nutritional supplements   Discharge summary: 82 year old female with history of hypertension, history of DVT status post IVC filter, type 2 diabetes on glipizide, CKD stage IIIb, cognitive impairment who was brought to the hospital by family with confusion and left leg pain.  Patient with recently worsening symptoms, poor mobility.  In the emergency room, she was found to have abnormal urine analysis.  Tachycardic, leukocytosis 12.3, blood pressures 180.  She was found to have cold left lower extremity, seen by vascular and found to have adequate blood supply.  She was admitted to the hospital treated for UTI and acute metabolic encephalopathy secondary to UTI.   Acute UTI present on admission, acute metabolic encephalopathy secondary to UTI.  Recent cognitive decline.  Multiple medical problems.  Probably early dementia. Sepsis present on admission secondary to tachycardia and leukocytosis and presence of UTI.   Patient was treated with IV fluids, she was treated with Rocephin.  Blood cultures negative.  Urine culture with E. coli.  Clinical  improvement today.  Sensitivity available.  Will treat with cefadroxil for 5 additional days to complete 7 days of therapy. Patient does have very poor mobility. Patient has left foot pain, previous injury and healed hematoma which is likely causing interference to her mobility. Advised to use Tylenol 1 g 3 times daily as needed. Patient is clinically improved, she has adequate support system at home.  She will go home, will prescribe home health PT.  She will also need more DME including hospital bed due to progressive immobility. Stable for discharge with family.     Discharge Diagnoses:  Principal Problem:   Sepsis secondary to UTI [facility]) Active Problems:   Uncontrolled type 2 diabetes mellitus with hyperglycemia, without long-term current use of insulin (HCC)   HTN (hypertension)   Stage 3b chronic kidney disease (CKD) (HCC)   Cognitive impairment   Contact dermatitis       Discharge Instructions   Discharge Instructions       Diet - low sodium heart healthy   Complete by: As directed      Diet Carb Modified   Complete by: As directed      Increase activity slowly   Complete by: As directed           Allergies as of 01/31/2023         Reactions    Amlodipine Besylate Other (See Comments)    Hair loss    Atenolol Other (See Comments)    Fluid retention    Oxycodone Hcl Other (See Comments)    Dizziness    Propoxyphene N-acetaminophen Other (See Comments)  Unknown reaction    Enalapril Maleate Palpitations            Medication List       TAKE these medications     acetaminophen 500 MG tablet Commonly known as: TYLENOL Take 2 tablets (1,000 mg total) by mouth every 8 (eight) hours as needed for mild pain, moderate pain, fever or headache. What changed:  how much to take when to take this reasons to take this    aspirin EC 81 MG tablet Take 81 mg by mouth at bedtime. Swallow whole.    carvedilol 25 MG tablet Commonly known as: COREG Take 1 tablet  (25 mg total) by mouth 2 (two) times daily with a meal.    cefadroxil 500 MG capsule Commonly known as: DURICEF Take 1 capsule (500 mg total) by mouth 2 (two) times daily for 5 days.    cholecalciferol 25 MCG (1000 UNIT) tablet Commonly known as: VITAMIN D3 Take 1,000 Units by mouth daily.    cyanocobalamin 1000 MCG tablet Take 1 tablet (1,000 mcg total) by mouth daily.    folic acid 1 MG tablet Commonly known as: FOLVITE Take 1 tablet (1 mg total) by mouth daily.    glimepiride 2 MG tablet Commonly known as: AMARYL Take 1 tablet (2 mg total) by mouth daily before breakfast.    omeprazole 20 MG capsule Commonly known as: PRILOSEC Take 1 capsule (20 mg total) by mouth daily.    thiamine 100 MG tablet Commonly known as: Vitamin B-1 Take 1 tablet (100 mg total) by mouth daily.    torsemide 20 MG tablet Commonly known as: DEMADEX Take 20 mg by mouth 3 (three) times a week. 1 tablet M-W-F for edema             "  Outpatient Medications Prior to Visit  Medication Sig Dispense Refill   acetaminophen (TYLENOL) 500 MG tablet Take 2 tablets (1,000 mg total) by mouth every 8 (eight) hours as needed for mild pain, moderate pain, fever or headache. 30 tablet 0   aspirin EC 81 MG tablet Take 81 mg by mouth at bedtime. Swallow whole.     omeprazole (PRILOSEC) 20 MG capsule Take 1 capsule (20 mg total) by mouth daily. 90 capsule 3   carvedilol (COREG) 25 MG tablet Take 1 tablet (25 mg total) by mouth 2 (two) times daily with a meal. 180 tablet 3   glimepiride (AMARYL) 2 MG tablet Take 1 tablet (2 mg total) by mouth daily before breakfast. 90 tablet 3   cholecalciferol (VITAMIN D3) 25 MCG (1000 UNIT) tablet Take 1,000 Units by mouth daily.     cyanocobalamin 1000 MCG tablet Take 1 tablet (1,000 mcg total) by mouth daily.     folic acid (FOLVITE) 1 MG tablet Take 1 tablet (1 mg total) by mouth daily.     thiamine (VITAMIN B-1) 100 MG tablet Take 1 tablet (100 mg total) by mouth daily.      torsemide (DEMADEX) 20 MG tablet Take 20 mg by mouth 3 (three) times a week. 1 tablet M-W-F for edema     No facility-administered medications prior to visit.    ROS: Review of Systems  Constitutional:  Positive for fatigue and unexpected weight change. Negative for activity change, appetite change and chills.  HENT:  Negative for congestion, mouth sores and sinus pressure.   Eyes:  Negative for visual disturbance.  Respiratory:  Negative for cough and chest tightness.   Gastrointestinal:  Negative for abdominal  pain and nausea.  Genitourinary:  Negative for difficulty urinating, frequency and vaginal pain.  Musculoskeletal:  Positive for gait problem. Negative for arthralgias and back pain.  Skin:  Negative for pallor and rash.  Neurological:  Negative for dizziness, tremors, weakness, numbness and headaches.  Psychiatric/Behavioral:  Positive for confusion. Negative for sleep disturbance.     Objective:  BP 110/60 (BP Location: Right Arm, Patient Position: Sitting, Cuff Size: Normal)   Pulse 68   Temp 98.6 F (37 C) (Oral)   Ht 4\' 11"  (1.499 m)   SpO2 94%   BMI 20.88 kg/m   BP Readings from Last 3 Encounters:  02/09/23 110/60  01/31/23 138/72  10/17/22 (!) 185/74    Wt Readings from Last 3 Encounters:  01/30/23 103 lb 6.3 oz (46.9 kg)  10/15/22 110 lb 0.2 oz (49.9 kg)  09/03/22 119 lb (54 kg)    Physical Exam Constitutional:      General: She is not in acute distress.    Appearance: Normal appearance. She is well-developed.  HENT:     Head: Normocephalic.     Right Ear: External ear normal.     Left Ear: External ear normal.     Nose: Nose normal.  Eyes:     General:        Right eye: No discharge.        Left eye: No discharge.     Conjunctiva/sclera: Conjunctivae normal.     Pupils: Pupils are equal, round, and reactive to light.  Neck:     Thyroid: No thyromegaly.     Vascular: No JVD.     Trachea: No tracheal deviation.  Cardiovascular:     Rate  and Rhythm: Normal rate and regular rhythm.     Heart sounds: Normal heart sounds.  Pulmonary:     Effort: No respiratory distress.     Breath sounds: No stridor. No wheezing.  Abdominal:     General: Bowel sounds are normal. There is no distension.     Palpations: Abdomen is soft. There is no mass.     Tenderness: There is no abdominal tenderness. There is no guarding or rebound.  Musculoskeletal:        General: No tenderness.     Cervical back: Normal range of motion and neck supple. No rigidity.     Right lower leg: No edema.     Left lower leg: No edema.  Lymphadenopathy:     Cervical: No cervical adenopathy.  Skin:    Findings: No erythema or rash.  Neurological:     Mental Status: Mental status is at baseline. She is disoriented.     Cranial Nerves: No cranial nerve deficit.     Motor: No abnormal muscle tone.     Coordination: Coordination normal.     Gait: Gait abnormal.     Deep Tendon Reflexes: Reflexes normal.  Psychiatric:        Behavior: Behavior normal.   In a w/c.  Alert, cooperative.  Falling asleep at times  Lab Results  Component Value Date   WBC 10.7 (H) 01/30/2023   HGB 11.1 (L) 01/30/2023   HCT 33.8 (L) 01/30/2023   PLT 215 01/30/2023   GLUCOSE 230 (H) 01/30/2023   CHOL 140 10/21/2021   TRIG 157.0 (H) 10/21/2021   HDL 37.00 (L) 10/21/2021   LDLDIRECT 78.9 10/22/2008   LDLCALC 72 10/21/2021   ALT 37 01/29/2023   AST 32 01/29/2023   NA 143 01/30/2023   K  3.4 (L) 01/30/2023   CL 115 (H) 01/30/2023   CREATININE 1.33 (H) 01/30/2023   BUN 34 (H) 01/30/2023   CO2 17 (L) 01/30/2023   TSH 1.460 04/27/2022   INR 1.6 (H) 05/18/2022   HGBA1C 6.0 (H) 01/30/2023   MICROALBUR <0.7 12/12/2020    DG Tibia/Fibula Left  Result Date: 01/29/2023 CLINICAL DATA:  Right lower leg pain that has been going on while EXAM: LEFT TIBIA AND FIBULA - 2 VIEW COMPARISON:  X-ray and CT scan 02/21/2022. FINDINGS: There has been extensive hardware placement. Intramedullary  rod in place along the tibia. Three proximal and 3 distal fixation screws. Chronic appearing posttraumatic deformity with callus along the midshaft of the tibia. Stable protruding component of callus anteriorly along the distal diaphysis. There also 2 screws obliquely seen along the medial malleolus. Lateral fixation plates seen with numerous screws along the mid to distal shaft of the fibula. No obvious hardware failure. On this limited portable views the more distal of the 3 proximal tibial fixation screws is not quite flush with the adjacent bone but no significant surrounding lucency along the screw in the projections are different than the prior x-ray. The appearance is more similar to the CT image of 02/21/2022. No acute fracture or dislocation. Osteopenia. Extensive vascular calcifications and other soft tissue calcifications about the knee and proximal tibia and fibula. IMPRESSION: Extensive hardware along the tibia and fibula. Old, traumatic bony deformity identified along the tibia greater than fibula. Osteopenia with chronic changes. Please correlate with particular symptoms and location. Electronically Signed   By: Karen Kays M.D.   On: 01/29/2023 18:43   DG Ribs Unilateral W/Chest Right  Result Date: 01/29/2023 CLINICAL DATA:  Rib pain that has been going on for a while. Confusion. No reported history of trauma EXAM: RIGHT RIBS AND CHEST - 3 VIEW COMPARISON:  Chest x-ray 10/14/2022 FINDINGS: No consolidation, pneumothorax or effusion. No edema. Normal cardiopericardial silhouette. Tortuous and ectatic aorta. Osteopenia with degenerative changes. No rib fracture seen by x-ray. The reviews are essentially AP. No significant oblique. Is also some elevation of the right humeral head relative to the acromion. Please correlate for rotator cuff tear IMPRESSION: Limited rib views. Osteopenia. No definite rib fracture by x-ray. No pneumothorax or effusion. Electronically Signed   By: Karen Kays M.D.    On: 01/29/2023 18:38    Assessment & Plan:   Problem List Items Addressed This Visit     RESOLVED: Uncontrolled type 2 diabetes mellitus with hyperglycemia, without long-term current use of insulin (HCC) - Primary (Chronic)    Cont on Glimepiride 1 mg d due to low CBG Check A1c q 3 months Use protein drinks daily GVOKE pen prn      Relevant Medications   glimepiride (AMARYL) 2 MG tablet   Glucagon (GVOKE HYPOPEN 1-PACK) 1 MG/0.2ML SOAJ   Other Relevant Orders   Comprehensive metabolic panel   TSH   Urinalysis   Hemoglobin A1c   HTN (hypertension)    Reduce Coreg - 12.5 mg bid      Relevant Medications   carvedilol (COREG) 25 MG tablet   Other Relevant Orders   Comprehensive metabolic panel   TSH   Urinalysis   Hemoglobin A1c   Stage 3b chronic kidney disease (CKD) (HCC)    Hydrate well       Relevant Orders   Comprehensive metabolic panel   TSH   Urinalysis   Hemoglobin A1c   Memory loss    Worse.  Alzheimer disease.  Discussed with daughter      Hypertensive chronic kidney disease with stage 1 through stage 4 chronic kidney disease, or unspecified chronic kidney disease    Hydrate well      Type 2 diabetes mellitus with diabetic neuropathy, unspecified (HCC)    Cont on Glimepiride 1 mg d due to low CBG Check A1c q 3 months Use protein drinks daily GVOKE pen prn      Relevant Medications   glimepiride (AMARYL) 2 MG tablet   Glucagon (GVOKE HYPOPEN 1-PACK) 1 MG/0.2ML SOAJ   Alzheimer's dementia (HCC)    Worse.  Discussed with daughter         Meds ordered this encounter  Medications   carvedilol (COREG) 25 MG tablet    Sig: Take 0.5 tablets (12.5 mg total) by mouth 2 (two) times daily with a meal.    Dispense:  180 tablet    Refill:  1   glimepiride (AMARYL) 2 MG tablet    Sig: Take 0.5 tablets (1 mg total) by mouth daily before breakfast.    Dispense:  90 tablet    Refill:  1   Glucagon (GVOKE HYPOPEN 1-PACK) 1 MG/0.2ML SOAJ    Sig:  Inject 0.2 mLs into the skin daily as needed.    Dispense:  0.4 mL    Refill:  3      Follow-up: Return in about 2 months (around 04/11/2023) for a follow-up visit.  Sonda Primes, MD

## 2023-02-10 DIAGNOSIS — F0280 Dementia in other diseases classified elsewhere without behavioral disturbance: Secondary | ICD-10-CM | POA: Insufficient documentation

## 2023-02-10 NOTE — Assessment & Plan Note (Addendum)
Worse.  Discussed with daughter

## 2023-02-10 NOTE — Assessment & Plan Note (Signed)
Worse.  Alzheimer disease.  Discussed with daughter

## 2023-02-10 NOTE — Assessment & Plan Note (Signed)
Hydrate well 

## 2023-02-11 NOTE — Telephone Encounter (Signed)
LVM for Vernona Rieger with [facility] for verbal Ok... Verbals for PT 2X a week for 1 week, 1X a week for 8 weeks

## 2023-02-11 NOTE — Telephone Encounter (Signed)
Okay with me.  Thank you °

## 2023-02-12 DIAGNOSIS — I509 Heart failure, unspecified: Secondary | ICD-10-CM | POA: Diagnosis not present

## 2023-02-12 DIAGNOSIS — N390 Urinary tract infection, site not specified: Secondary | ICD-10-CM | POA: Diagnosis not present

## 2023-02-12 DIAGNOSIS — I130 Hypertensive heart and chronic kidney disease with heart failure and stage 1 through stage 4 chronic kidney disease, or unspecified chronic kidney disease: Secondary | ICD-10-CM | POA: Diagnosis not present

## 2023-02-12 DIAGNOSIS — E1165 Type 2 diabetes mellitus with hyperglycemia: Secondary | ICD-10-CM | POA: Diagnosis not present

## 2023-02-12 DIAGNOSIS — B9620 Unspecified Escherichia coli [E. coli] as the cause of diseases classified elsewhere: Secondary | ICD-10-CM | POA: Diagnosis not present

## 2023-02-12 DIAGNOSIS — G9341 Metabolic encephalopathy: Secondary | ICD-10-CM | POA: Diagnosis not present

## 2023-02-15 DIAGNOSIS — I130 Hypertensive heart and chronic kidney disease with heart failure and stage 1 through stage 4 chronic kidney disease, or unspecified chronic kidney disease: Secondary | ICD-10-CM | POA: Diagnosis not present

## 2023-02-15 DIAGNOSIS — G9341 Metabolic encephalopathy: Secondary | ICD-10-CM | POA: Diagnosis not present

## 2023-02-15 DIAGNOSIS — B9620 Unspecified Escherichia coli [E. coli] as the cause of diseases classified elsewhere: Secondary | ICD-10-CM | POA: Diagnosis not present

## 2023-02-15 DIAGNOSIS — E1165 Type 2 diabetes mellitus with hyperglycemia: Secondary | ICD-10-CM | POA: Diagnosis not present

## 2023-02-15 DIAGNOSIS — I509 Heart failure, unspecified: Secondary | ICD-10-CM | POA: Diagnosis not present

## 2023-02-15 DIAGNOSIS — N390 Urinary tract infection, site not specified: Secondary | ICD-10-CM | POA: Diagnosis not present

## 2023-02-22 DIAGNOSIS — I509 Heart failure, unspecified: Secondary | ICD-10-CM | POA: Diagnosis not present

## 2023-02-22 DIAGNOSIS — E1165 Type 2 diabetes mellitus with hyperglycemia: Secondary | ICD-10-CM | POA: Diagnosis not present

## 2023-02-22 DIAGNOSIS — N390 Urinary tract infection, site not specified: Secondary | ICD-10-CM | POA: Diagnosis not present

## 2023-02-22 DIAGNOSIS — G9341 Metabolic encephalopathy: Secondary | ICD-10-CM | POA: Diagnosis not present

## 2023-02-22 DIAGNOSIS — I130 Hypertensive heart and chronic kidney disease with heart failure and stage 1 through stage 4 chronic kidney disease, or unspecified chronic kidney disease: Secondary | ICD-10-CM | POA: Diagnosis not present

## 2023-02-22 DIAGNOSIS — B9620 Unspecified Escherichia coli [E. coli] as the cause of diseases classified elsewhere: Secondary | ICD-10-CM | POA: Diagnosis not present

## 2023-02-24 ENCOUNTER — Telehealth: Payer: Self-pay | Admitting: Internal Medicine

## 2023-02-24 DIAGNOSIS — E1122 Type 2 diabetes mellitus with diabetic chronic kidney disease: Secondary | ICD-10-CM | POA: Diagnosis not present

## 2023-02-24 DIAGNOSIS — E1165 Type 2 diabetes mellitus with hyperglycemia: Secondary | ICD-10-CM | POA: Diagnosis not present

## 2023-02-24 DIAGNOSIS — I130 Hypertensive heart and chronic kidney disease with heart failure and stage 1 through stage 4 chronic kidney disease, or unspecified chronic kidney disease: Secondary | ICD-10-CM | POA: Diagnosis not present

## 2023-02-24 DIAGNOSIS — N1832 Chronic kidney disease, stage 3b: Secondary | ICD-10-CM | POA: Diagnosis not present

## 2023-02-24 DIAGNOSIS — G9341 Metabolic encephalopathy: Secondary | ICD-10-CM | POA: Diagnosis not present

## 2023-02-24 DIAGNOSIS — D500 Iron deficiency anemia secondary to blood loss (chronic): Secondary | ICD-10-CM | POA: Diagnosis not present

## 2023-02-24 DIAGNOSIS — E559 Vitamin D deficiency, unspecified: Secondary | ICD-10-CM | POA: Diagnosis not present

## 2023-02-24 DIAGNOSIS — B9620 Unspecified Escherichia coli [E. coli] as the cause of diseases classified elsewhere: Secondary | ICD-10-CM | POA: Diagnosis not present

## 2023-02-24 DIAGNOSIS — E538 Deficiency of other specified B group vitamins: Secondary | ICD-10-CM | POA: Diagnosis not present

## 2023-02-24 DIAGNOSIS — I509 Heart failure, unspecified: Secondary | ICD-10-CM | POA: Diagnosis not present

## 2023-02-24 DIAGNOSIS — E1140 Type 2 diabetes mellitus with diabetic neuropathy, unspecified: Secondary | ICD-10-CM | POA: Diagnosis not present

## 2023-02-24 DIAGNOSIS — N390 Urinary tract infection, site not specified: Secondary | ICD-10-CM | POA: Diagnosis not present

## 2023-02-24 NOTE — Telephone Encounter (Signed)
Brandy Gonzales from [facility] called to see if Dr. Posey Rea has any concerns about the patient regarding her care and mental state. She would like a call back at [phone](secure).

## 2023-03-01 NOTE — Telephone Encounter (Signed)
The patient is suffering with Alzheimer's disease. I did not have any concerns about her care at the time of her last visit to the office. However, if you have concerns, please investigate.  Thank you

## 2023-03-01 NOTE — Telephone Encounter (Signed)
I was able to [facility] of Dr. Loren Racer response for [facility] and if she has any question's or concerns to give our office a call back.

## 2023-03-10 DIAGNOSIS — N390 Urinary tract infection, site not specified: Secondary | ICD-10-CM | POA: Diagnosis not present

## 2023-03-10 DIAGNOSIS — E538 Deficiency of other specified B group vitamins: Secondary | ICD-10-CM | POA: Diagnosis not present

## 2023-03-10 DIAGNOSIS — Z7982 Long term (current) use of aspirin: Secondary | ICD-10-CM | POA: Diagnosis not present

## 2023-03-10 DIAGNOSIS — G510 Bell's palsy: Secondary | ICD-10-CM | POA: Diagnosis not present

## 2023-03-10 DIAGNOSIS — E1165 Type 2 diabetes mellitus with hyperglycemia: Secondary | ICD-10-CM | POA: Diagnosis not present

## 2023-03-10 DIAGNOSIS — Z9181 History of falling: Secondary | ICD-10-CM | POA: Diagnosis not present

## 2023-03-10 DIAGNOSIS — R4189 Other symptoms and signs involving cognitive functions and awareness: Secondary | ICD-10-CM | POA: Diagnosis not present

## 2023-03-10 DIAGNOSIS — B9620 Unspecified Escherichia coli [E. coli] as the cause of diseases classified elsewhere: Secondary | ICD-10-CM | POA: Diagnosis not present

## 2023-03-10 DIAGNOSIS — Z8781 Personal history of (healed) traumatic fracture: Secondary | ICD-10-CM | POA: Diagnosis not present

## 2023-03-10 DIAGNOSIS — I509 Heart failure, unspecified: Secondary | ICD-10-CM | POA: Diagnosis not present

## 2023-03-10 DIAGNOSIS — Z7984 Long term (current) use of oral hypoglycemic drugs: Secondary | ICD-10-CM | POA: Diagnosis not present

## 2023-03-10 DIAGNOSIS — G9341 Metabolic encephalopathy: Secondary | ICD-10-CM | POA: Diagnosis not present

## 2023-03-10 DIAGNOSIS — Z95828 Presence of other vascular implants and grafts: Secondary | ICD-10-CM | POA: Diagnosis not present

## 2023-03-10 DIAGNOSIS — I130 Hypertensive heart and chronic kidney disease with heart failure and stage 1 through stage 4 chronic kidney disease, or unspecified chronic kidney disease: Secondary | ICD-10-CM | POA: Diagnosis not present

## 2023-03-10 DIAGNOSIS — E1140 Type 2 diabetes mellitus with diabetic neuropathy, unspecified: Secondary | ICD-10-CM | POA: Diagnosis not present

## 2023-03-10 DIAGNOSIS — E559 Vitamin D deficiency, unspecified: Secondary | ICD-10-CM | POA: Diagnosis not present

## 2023-03-10 DIAGNOSIS — N1832 Chronic kidney disease, stage 3b: Secondary | ICD-10-CM | POA: Diagnosis not present

## 2023-03-10 DIAGNOSIS — D500 Iron deficiency anemia secondary to blood loss (chronic): Secondary | ICD-10-CM | POA: Diagnosis not present

## 2023-03-10 DIAGNOSIS — Z86718 Personal history of other venous thrombosis and embolism: Secondary | ICD-10-CM | POA: Diagnosis not present

## 2023-03-10 DIAGNOSIS — K219 Gastro-esophageal reflux disease without esophagitis: Secondary | ICD-10-CM | POA: Diagnosis not present

## 2023-03-10 DIAGNOSIS — E1122 Type 2 diabetes mellitus with diabetic chronic kidney disease: Secondary | ICD-10-CM | POA: Diagnosis not present

## 2023-03-10 DIAGNOSIS — L259 Unspecified contact dermatitis, unspecified cause: Secondary | ICD-10-CM | POA: Diagnosis not present

## 2023-03-12 DIAGNOSIS — G9341 Metabolic encephalopathy: Secondary | ICD-10-CM | POA: Diagnosis not present

## 2023-03-12 DIAGNOSIS — B9620 Unspecified Escherichia coli [E. coli] as the cause of diseases classified elsewhere: Secondary | ICD-10-CM | POA: Diagnosis not present

## 2023-03-12 DIAGNOSIS — I130 Hypertensive heart and chronic kidney disease with heart failure and stage 1 through stage 4 chronic kidney disease, or unspecified chronic kidney disease: Secondary | ICD-10-CM | POA: Diagnosis not present

## 2023-03-12 DIAGNOSIS — E1165 Type 2 diabetes mellitus with hyperglycemia: Secondary | ICD-10-CM | POA: Diagnosis not present

## 2023-03-12 DIAGNOSIS — N390 Urinary tract infection, site not specified: Secondary | ICD-10-CM | POA: Diagnosis not present

## 2023-03-12 DIAGNOSIS — I509 Heart failure, unspecified: Secondary | ICD-10-CM | POA: Diagnosis not present

## 2023-03-18 ENCOUNTER — Other Ambulatory Visit: Payer: Self-pay | Admitting: Internal Medicine

## 2023-03-19 DIAGNOSIS — N390 Urinary tract infection, site not specified: Secondary | ICD-10-CM | POA: Diagnosis not present

## 2023-03-19 DIAGNOSIS — G9341 Metabolic encephalopathy: Secondary | ICD-10-CM | POA: Diagnosis not present

## 2023-03-19 DIAGNOSIS — I509 Heart failure, unspecified: Secondary | ICD-10-CM | POA: Diagnosis not present

## 2023-03-19 DIAGNOSIS — B9620 Unspecified Escherichia coli [E. coli] as the cause of diseases classified elsewhere: Secondary | ICD-10-CM | POA: Diagnosis not present

## 2023-03-19 DIAGNOSIS — E1165 Type 2 diabetes mellitus with hyperglycemia: Secondary | ICD-10-CM | POA: Diagnosis not present

## 2023-03-19 DIAGNOSIS — I130 Hypertensive heart and chronic kidney disease with heart failure and stage 1 through stage 4 chronic kidney disease, or unspecified chronic kidney disease: Secondary | ICD-10-CM | POA: Diagnosis not present

## 2023-03-26 DIAGNOSIS — N390 Urinary tract infection, site not specified: Secondary | ICD-10-CM | POA: Diagnosis not present

## 2023-03-26 DIAGNOSIS — I509 Heart failure, unspecified: Secondary | ICD-10-CM | POA: Diagnosis not present

## 2023-03-26 DIAGNOSIS — B9620 Unspecified Escherichia coli [E. coli] as the cause of diseases classified elsewhere: Secondary | ICD-10-CM | POA: Diagnosis not present

## 2023-03-26 DIAGNOSIS — I130 Hypertensive heart and chronic kidney disease with heart failure and stage 1 through stage 4 chronic kidney disease, or unspecified chronic kidney disease: Secondary | ICD-10-CM | POA: Diagnosis not present

## 2023-03-26 DIAGNOSIS — G9341 Metabolic encephalopathy: Secondary | ICD-10-CM | POA: Diagnosis not present

## 2023-03-26 DIAGNOSIS — E1165 Type 2 diabetes mellitus with hyperglycemia: Secondary | ICD-10-CM | POA: Diagnosis not present

## 2023-03-29 DIAGNOSIS — G9341 Metabolic encephalopathy: Secondary | ICD-10-CM | POA: Diagnosis not present

## 2023-03-29 DIAGNOSIS — E1165 Type 2 diabetes mellitus with hyperglycemia: Secondary | ICD-10-CM | POA: Diagnosis not present

## 2023-03-29 DIAGNOSIS — I130 Hypertensive heart and chronic kidney disease with heart failure and stage 1 through stage 4 chronic kidney disease, or unspecified chronic kidney disease: Secondary | ICD-10-CM | POA: Diagnosis not present

## 2023-03-29 DIAGNOSIS — N390 Urinary tract infection, site not specified: Secondary | ICD-10-CM | POA: Diagnosis not present

## 2023-03-29 DIAGNOSIS — B9620 Unspecified Escherichia coli [E. coli] as the cause of diseases classified elsewhere: Secondary | ICD-10-CM | POA: Diagnosis not present

## 2023-03-29 DIAGNOSIS — I509 Heart failure, unspecified: Secondary | ICD-10-CM | POA: Diagnosis not present

## 2023-04-01 ENCOUNTER — Inpatient Hospital Stay (HOSPITAL_COMMUNITY)
Admission: EM | Admit: 2023-04-01 | Discharge: 2023-04-04 | DRG: 057 | Disposition: A | Discharge status: Home Health | Payer: Medicare Other | Attending: Family Medicine | Admitting: Family Medicine

## 2023-04-01 ENCOUNTER — Other Ambulatory Visit: Payer: Self-pay

## 2023-04-01 ENCOUNTER — Encounter (HOSPITAL_COMMUNITY): Payer: Self-pay | Admitting: Internal Medicine

## 2023-04-01 ENCOUNTER — Emergency Department (HOSPITAL_COMMUNITY): Payer: Medicare Other

## 2023-04-01 DIAGNOSIS — G309 Alzheimer's disease, unspecified: Secondary | ICD-10-CM | POA: Diagnosis not present

## 2023-04-01 DIAGNOSIS — H409 Unspecified glaucoma: Secondary | ICD-10-CM | POA: Diagnosis present

## 2023-04-01 DIAGNOSIS — Z7984 Long term (current) use of oral hypoglycemic drugs: Secondary | ICD-10-CM

## 2023-04-01 DIAGNOSIS — I10 Essential (primary) hypertension: Secondary | ICD-10-CM | POA: Diagnosis not present

## 2023-04-01 DIAGNOSIS — F02B18 Dementia in other diseases classified elsewhere, moderate, with other behavioral disturbance: Secondary | ICD-10-CM

## 2023-04-01 DIAGNOSIS — N1832 Chronic kidney disease, stage 3b: Secondary | ICD-10-CM | POA: Diagnosis not present

## 2023-04-01 DIAGNOSIS — G40909 Epilepsy, unspecified, not intractable, without status epilepticus: Secondary | ICD-10-CM

## 2023-04-01 DIAGNOSIS — R531 Weakness: Secondary | ICD-10-CM | POA: Diagnosis not present

## 2023-04-01 DIAGNOSIS — Z841 Family history of disorders of kidney and ureter: Secondary | ICD-10-CM | POA: Diagnosis not present

## 2023-04-01 DIAGNOSIS — Z95828 Presence of other vascular implants and grafts: Secondary | ICD-10-CM | POA: Diagnosis not present

## 2023-04-01 DIAGNOSIS — D631 Anemia in chronic kidney disease: Secondary | ICD-10-CM | POA: Diagnosis present

## 2023-04-01 DIAGNOSIS — G9340 Encephalopathy, unspecified: Secondary | ICD-10-CM | POA: Diagnosis not present

## 2023-04-01 DIAGNOSIS — F0280 Dementia in other diseases classified elsewhere without behavioral disturbance: Secondary | ICD-10-CM | POA: Diagnosis present

## 2023-04-01 DIAGNOSIS — R414 Neurologic neglect syndrome: Secondary | ICD-10-CM | POA: Diagnosis present

## 2023-04-01 DIAGNOSIS — I129 Hypertensive chronic kidney disease with stage 1 through stage 4 chronic kidney disease, or unspecified chronic kidney disease: Secondary | ICD-10-CM | POA: Diagnosis present

## 2023-04-01 DIAGNOSIS — R9082 White matter disease, unspecified: Secondary | ICD-10-CM | POA: Diagnosis not present

## 2023-04-01 DIAGNOSIS — Z8673 Personal history of transient ischemic attack (TIA), and cerebral infarction without residual deficits: Secondary | ICD-10-CM | POA: Diagnosis not present

## 2023-04-01 DIAGNOSIS — N3281 Overactive bladder: Secondary | ICD-10-CM | POA: Diagnosis present

## 2023-04-01 DIAGNOSIS — Z90710 Acquired absence of both cervix and uterus: Secondary | ICD-10-CM

## 2023-04-01 DIAGNOSIS — Z8249 Family history of ischemic heart disease and other diseases of the circulatory system: Secondary | ICD-10-CM

## 2023-04-01 DIAGNOSIS — E785 Hyperlipidemia, unspecified: Secondary | ICD-10-CM

## 2023-04-01 DIAGNOSIS — Z86718 Personal history of other venous thrombosis and embolism: Secondary | ICD-10-CM

## 2023-04-01 DIAGNOSIS — B9620 Unspecified Escherichia coli [E. coli] as the cause of diseases classified elsewhere: Secondary | ICD-10-CM | POA: Diagnosis not present

## 2023-04-01 DIAGNOSIS — Z8619 Personal history of other infectious and parasitic diseases: Secondary | ICD-10-CM

## 2023-04-01 DIAGNOSIS — I6782 Cerebral ischemia: Secondary | ICD-10-CM | POA: Diagnosis not present

## 2023-04-01 DIAGNOSIS — Z888 Allergy status to other drugs, medicaments and biological substances status: Secondary | ICD-10-CM

## 2023-04-01 DIAGNOSIS — R569 Unspecified convulsions: Secondary | ICD-10-CM | POA: Diagnosis not present

## 2023-04-01 DIAGNOSIS — Z9181 History of falling: Secondary | ICD-10-CM

## 2023-04-01 DIAGNOSIS — Z8701 Personal history of pneumonia (recurrent): Secondary | ICD-10-CM

## 2023-04-01 DIAGNOSIS — Z7982 Long term (current) use of aspirin: Secondary | ICD-10-CM

## 2023-04-01 DIAGNOSIS — N390 Urinary tract infection, site not specified: Secondary | ICD-10-CM | POA: Diagnosis present

## 2023-04-01 DIAGNOSIS — I672 Cerebral atherosclerosis: Secondary | ICD-10-CM | POA: Diagnosis not present

## 2023-04-01 DIAGNOSIS — G9349 Other encephalopathy: Secondary | ICD-10-CM | POA: Diagnosis present

## 2023-04-01 DIAGNOSIS — G301 Alzheimer's disease with late onset: Secondary | ICD-10-CM | POA: Diagnosis not present

## 2023-04-01 DIAGNOSIS — Z833 Family history of diabetes mellitus: Secondary | ICD-10-CM | POA: Diagnosis not present

## 2023-04-01 DIAGNOSIS — G40901 Epilepsy, unspecified, not intractable, with status epilepticus: Principal | ICD-10-CM | POA: Diagnosis present

## 2023-04-01 DIAGNOSIS — G9389 Other specified disorders of brain: Secondary | ICD-10-CM | POA: Diagnosis not present

## 2023-04-01 DIAGNOSIS — Z79899 Other long term (current) drug therapy: Secondary | ICD-10-CM | POA: Diagnosis not present

## 2023-04-01 DIAGNOSIS — I6529 Occlusion and stenosis of unspecified carotid artery: Secondary | ICD-10-CM | POA: Diagnosis not present

## 2023-04-01 DIAGNOSIS — D649 Anemia, unspecified: Secondary | ICD-10-CM | POA: Diagnosis present

## 2023-04-01 DIAGNOSIS — R4182 Altered mental status, unspecified: Secondary | ICD-10-CM | POA: Diagnosis not present

## 2023-04-01 DIAGNOSIS — R4020 Unspecified coma: Secondary | ICD-10-CM | POA: Diagnosis not present

## 2023-04-01 DIAGNOSIS — Z885 Allergy status to narcotic agent status: Secondary | ICD-10-CM

## 2023-04-01 DIAGNOSIS — K219 Gastro-esophageal reflux disease without esophagitis: Secondary | ICD-10-CM

## 2023-04-01 DIAGNOSIS — E1122 Type 2 diabetes mellitus with diabetic chronic kidney disease: Secondary | ICD-10-CM

## 2023-04-01 DIAGNOSIS — I69198 Other sequelae of nontraumatic intracerebral hemorrhage: Principal | ICD-10-CM

## 2023-04-01 LAB — APTT: aPTT: 30 s (ref 24–36)

## 2023-04-01 LAB — COMPREHENSIVE METABOLIC PANEL
ALT: 35 U/L (ref 0–44)
AST: 30 U/L (ref 15–41)
Albumin: 3.3 g/dL — ABNORMAL LOW (ref 3.5–5.0)
Alkaline Phosphatase: 125 U/L (ref 38–126)
Anion gap: 7 (ref 5–15)
BUN: 18 mg/dL (ref 8–23)
CO2: 19 mmol/L — ABNORMAL LOW (ref 22–32)
Calcium: 9.9 mg/dL (ref 8.9–10.3)
Chloride: 114 mmol/L — ABNORMAL HIGH (ref 98–111)
Creatinine, Ser: 1.11 mg/dL — ABNORMAL HIGH (ref 0.44–1.00)
GFR, Estimated: 49 mL/min — ABNORMAL LOW (ref >60)
Glucose, Bld: 110 mg/dL — ABNORMAL HIGH (ref 70–99)
Potassium: 4.2 mmol/L (ref 3.5–5.1)
Sodium: 140 mmol/L (ref 135–145)
Total Bilirubin: 1 mg/dL (ref <1.2)
Total Protein: 6.1 g/dL — ABNORMAL LOW (ref 6.5–8.1)

## 2023-04-01 LAB — DIFFERENTIAL
Abs Immature Granulocytes: 0.02 10*3/uL (ref 0.00–0.07)
Basophils Absolute: 0.1 10*3/uL (ref 0.0–0.1)
Basophils Relative: 1 %
Eosinophils Absolute: 0.2 10*3/uL (ref 0.0–0.5)
Eosinophils Relative: 3 %
Immature Granulocytes: 0 %
Lymphocytes Relative: 25 %
Lymphs Abs: 1.8 10*3/uL (ref 0.7–4.0)
Monocytes Absolute: 0.6 10*3/uL (ref 0.1–1.0)
Monocytes Relative: 8 %
Neutro Abs: 4.5 10*3/uL (ref 1.7–7.7)
Neutrophils Relative %: 63 %

## 2023-04-01 LAB — I-STAT CHEM 8, ED
BUN: 24 mg/dL — ABNORMAL HIGH (ref 8–23)
Calcium, Ion: 1.12 mmol/L — ABNORMAL LOW (ref 1.15–1.40)
Chloride: 116 mmol/L — ABNORMAL HIGH (ref 98–111)
Creatinine, Ser: 1 mg/dL (ref 0.44–1.00)
Glucose, Bld: 122 mg/dL — ABNORMAL HIGH (ref 70–99)
HCT: 34 % — ABNORMAL LOW (ref 36.0–46.0)
Hemoglobin: 11.6 g/dL — ABNORMAL LOW (ref 12.0–15.0)
Potassium: 4.9 mmol/L (ref 3.5–5.1)
Sodium: 143 mmol/L (ref 135–145)
TCO2: 19 mmol/L — ABNORMAL LOW (ref 22–32)

## 2023-04-01 LAB — CBC
HCT: 36.5 % (ref 36.0–46.0)
Hemoglobin: 12 g/dL (ref 12.0–15.0)
MCH: 31.2 pg (ref 26.0–34.0)
MCHC: 32.9 g/dL (ref 30.0–36.0)
MCV: 94.8 fL (ref 80.0–100.0)
Platelets: 156 10*3/uL (ref 150–400)
RBC: 3.85 MIL/uL — ABNORMAL LOW (ref 3.87–5.11)
RDW: 13.1 % (ref 11.5–15.5)
WBC: 7.1 10*3/uL (ref 4.0–10.5)
nRBC: 0 % (ref 0.0–0.2)

## 2023-04-01 LAB — PROTIME-INR
INR: 1.1 (ref 0.8–1.2)
Prothrombin Time: 14.5 s (ref 11.4–15.2)

## 2023-04-01 LAB — GLUCOSE, CAPILLARY: Glucose-Capillary: 115 mg/dL — ABNORMAL HIGH (ref 70–99)

## 2023-04-01 LAB — ETHANOL: Alcohol, Ethyl (B): <10 mg/dL (ref <10)

## 2023-04-01 LAB — CBG MONITORING, ED
Glucose-Capillary: 108 mg/dL — ABNORMAL HIGH (ref 70–99)
Glucose-Capillary: 101 mg/dL — ABNORMAL HIGH (ref 70–99)

## 2023-04-01 MED ORDER — ACETAMINOPHEN 650 MG RE SUPP: 650.0000 mg | Freq: Four times a day (QID) | RECTAL | Status: DC | PRN

## 2023-04-01 MED ORDER — BISACODYL 10 MG RE SUPP: 10.0000 mg | Freq: Every day | RECTAL | Status: DC | PRN

## 2023-04-01 MED ORDER — SODIUM CHLORIDE 0.9% FLUSH
3.0000 mL | Freq: Two times a day (BID) | INTRAVENOUS | Status: DC
  Administered 2023-04-01 – 2023-04-04 (×6): 3 mL via INTRAVENOUS

## 2023-04-01 MED ORDER — LORAZEPAM 2 MG/ML IJ SOLN
INTRAMUSCULAR | Status: AC
  Administered 2023-04-01: 2 mg
  Filled 2023-04-01: qty 1

## 2023-04-01 MED ORDER — LEVETIRACETAM IN NACL 500 MG/100ML IV SOLN
500.0000 mg | Freq: Two times a day (BID) | INTRAVENOUS | Status: DC
  Administered 2023-04-01 – 2023-04-02 (×2): 500 mg via INTRAVENOUS
  Filled 2023-04-01 (×3): qty 100

## 2023-04-01 MED ORDER — POLYETHYLENE GLYCOL 3350 17 G PO PACK: 17.0000 g | PACK | Freq: Every day | ORAL | Status: DC | PRN

## 2023-04-01 MED ORDER — LEVETIRACETAM IN NACL 1000 MG/100ML IV SOLN
1000.0000 mg | INTRAVENOUS | Status: AC
  Administered 2023-04-01: 1000 mg via INTRAVENOUS

## 2023-04-01 MED ORDER — IOHEXOL 350 MG/ML SOLN
75.0000 mL | Freq: Once | INTRAVENOUS | Status: AC | PRN
  Administered 2023-04-01: 75 mL via INTRAVENOUS

## 2023-04-01 MED ORDER — LORAZEPAM 2 MG/ML IJ SOLN
2.0000 mg | Freq: Once | INTRAMUSCULAR | Status: AC
  Administered 2023-04-01: 2 mg via INTRAVENOUS

## 2023-04-01 MED ORDER — APIXABAN 2.5 MG PO TABS
2.5000 mg | ORAL_TABLET | Freq: Two times a day (BID) | ORAL | Status: DC
  Administered 2023-04-02 – 2023-04-04 (×4): 2.5 mg via ORAL
  Filled 2023-04-01 (×4): qty 1

## 2023-04-01 MED ORDER — LEVETIRACETAM IN NACL 1000 MG/100ML IV SOLN
INTRAVENOUS | Status: AC
  Administered 2023-04-01: 1000 mg
  Filled 2023-04-01: qty 200

## 2023-04-01 MED ORDER — INSULIN ASPART 100 UNIT/ML IJ SOLN: 0.0000 [IU] | INTRAMUSCULAR | Status: DC

## 2023-04-01 MED ORDER — ACETAMINOPHEN 325 MG PO TABS: 650.0000 mg | ORAL_TABLET | Freq: Four times a day (QID) | ORAL | Status: DC | PRN

## 2023-04-01 MED ORDER — ENOXAPARIN SODIUM 30 MG/0.3ML IJ SOSY: 30.0000 mg | PREFILLED_SYRINGE | INTRAMUSCULAR | Status: DC

## 2023-04-01 MED ORDER — LORAZEPAM 2 MG/ML IJ SOLN
2.0000 mg | INTRAMUSCULAR | Status: DC | PRN
Start: 2023-04-01 — End: 2023-04-04

## 2023-04-01 MED ORDER — LABETALOL HCL 5 MG/ML IV SOLN
INTRAVENOUS | Status: AC
  Filled 2023-04-01: qty 4
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
## 2023-04-01 NOTE — Progress Notes (Signed)
Patient somnolent, non-arousable, responding to pain, vital signs stable, will continue to monitor.

## 2023-04-01 NOTE — Progress Notes (Signed)
Patient received from ED, is drowsy but arousable, not following commands, confused, responds to voice, tele box connected, on LT EEG, will continue to monitor

## 2023-04-01 NOTE — Progress Notes (Signed)
vLTM started  All impedances below 10k.  Not being monitored at present  ED patient

## 2023-04-01 NOTE — ED Notes (Signed)
Unable to get labs: NB
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
## 2023-04-01 NOTE — ED Provider Notes (Signed)
[place] EMERGENCY DEPARTMENT AT [facility] Provider Note   CSN: [id number] Arrival date & time: 04/01/23  1231  An emergency department physician performed an initial assessment on this suspected stroke patient at 1231.  History  Chief Complaint  Patient presents with   Seizures   Weakness    Brandy Gonzales is a 82 y.o. female.  She is brought in as a code stroke activation.  Last known well was 7 AM.  At around 1230 she was noted to have left gaze and some left-sided clonic movement.  She was given Ativan by neurology on arrival and taken emergently to CAT scan.  The history is provided by the EMS personnel.  Altered Mental Status Presenting symptoms: disorientation   Most recent episode:  Today Timing:  Constant Progression:  Unchanged Chronicity:  New Associated symptoms: abnormal movement        Home Medications Prior to Admission medications   Medication Sig Start Date End Date Taking? Authorizing Provider  acetaminophen (TYLENOL) 500 MG tablet Take 2 tablets (1,000 mg total) by mouth every 8 (eight) hours as needed for mild pain, moderate pain, fever or headache. 01/31/23   Dorcas Carrow, MD  aspirin EC 81 MG tablet Take 81 mg by mouth at bedtime. Swallow whole.    [provider]  carvedilol (COREG) 25 MG tablet TAKE 1 TABLET(25 MG) BY MOUTH TWICE DAILY WITH A MEAL 03/18/23   [name], [name], MD  glimepiride (AMARYL) 2 MG tablet Take 0.5 tablets (1 mg total) by mouth daily before breakfast. 02/09/23   [name], [name], MD  Glucagon (GVOKE HYPOPEN 1-PACK) 1 MG/0.2ML SOAJ Inject 0.2 mLs into the skin daily as needed. 02/09/23   [name], [name], MD  omeprazole (PRILOSEC) 20 MG capsule TAKE 1 CAPSULE(20 MG) BY MOUTH DAILY 03/18/23   [name], [name], MD      Allergies    Amlodipine besylate, Atenolol, Oxycodone hcl, Propoxyphene n-acetaminophen, and Enalapril maleate    Review of Systems   Review of Systems  Unable to perform ROS:  Mental status change    Physical Exam Updated Vital Signs BP (!) 157/81 (BP Location: Right Arm)   Pulse 65   Temp 98 F (36.7 C) (Axillary)   Resp 18   Ht 4\' 11"  (1.499 m)   Wt 48.4 kg   SpO2 100%   BMI 21.55 kg/m  Physical Exam Vitals and nursing note reviewed.  Constitutional:      General: She is not in acute distress.    Appearance: Normal appearance. She is well-developed.  HENT:     Head: Normocephalic and atraumatic.  Eyes:     Conjunctiva/sclera: Conjunctivae normal.  Cardiovascular:     Rate and Rhythm: Normal rate and regular rhythm.     Heart sounds: No murmur heard. Pulmonary:     Effort: Pulmonary effort is normal. No respiratory distress.     Breath sounds: Normal breath sounds.  Abdominal:     Palpations: Abdomen is soft.     Tenderness: There is no abdominal tenderness. There is no guarding or rebound.  Musculoskeletal:        General: No swelling.     Cervical back: Neck supple.  Skin:    General: Skin is warm and dry.     Capillary Refill: Capillary refill takes less than 2 seconds.  Neurological:     Comments: Patient is somnolent and has just received Ativan and is being loaded with seizure medications.  She is not  following commands.  She still has some left eye deviation.  No obvious seizure activity noted.     ED Results / Procedures / Treatments   Labs (all labs ordered are listed, but only abnormal results are displayed) Labs Reviewed  CBC - Abnormal; Notable for the following components:      Result Value   RBC 3.85 (*)    All other components within normal limits  GLUCOSE, CAPILLARY - Abnormal; Notable for the following components:   Glucose-Capillary 115 (*)    All other components within normal limits  I-STAT CHEM 8, ED - Abnormal; Notable for the following components:   Chloride 116 (*)    BUN 24 (*)    Glucose, Bld 122 (*)    Calcium, Ion 1.12 (*)    TCO2 19 (*)    Hemoglobin 11.6 (*)    HCT 34.0 (*)    All other  components within normal limits  CBG MONITORING, ED - Abnormal; Notable for the following components:   Glucose-Capillary 108 (*)    All other components within normal limits  CBG MONITORING, ED - Abnormal; Notable for the following components:   Glucose-Capillary 101 (*)    All other components within normal limits  ETHANOL  DIFFERENTIAL  RAPID URINE DRUG SCREEN, HOSP PERFORMED  URINALYSIS, ROUTINE W REFLEX MICROSCOPIC  PROTIME-INR  APTT  COMPREHENSIVE METABOLIC PANEL  COMPREHENSIVE METABOLIC PANEL  CBC    EKG None EKG not crossing in epic.  Sinus rhythm nonspecific ST depressions similar to prior EKG Radiology CT VENOGRAM HEAD  Result Date: 04/01/2023 CLINICAL DATA:  Code stroke.  Subcortical high density by head CT. EXAM: CT VENOGRAM HEAD TECHNIQUE: Venographic phase images of the brain were obtained following the administration of intravenous contrast. Multiplanar reformats and maximum intensity projections were generated. RADIATION DOSE REDUCTION: This exam was performed according to the departmental dose-optimization program which includes automated exposure control, adjustment of the mA and/or kV according to patient size and/or use of iterative reconstruction technique. CONTRAST:  75mL OMNIPAQUE IOHEXOL 350 MG/ML SOLN COMPARISON:  Preceding head CT. FINDINGS: The dural venous sinuses are diffusely patent no stricture or diverticulum seen. No evidence of central or cortical venous thrombosis. The subcortical high density in the right parietal region is again noted without convincing adjacent enhancement. No abnormal parenchymal enhancement seen throughout the brain. IMPRESSION: No dural venous sinus thrombosis or other acute venous finding. Electronically Signed   By: Tiburcio Pea M.D.   On: 04/01/2023 13:10   CT ANGIO HEAD NECK W WO CM (CODE STROKE)  Result Date: 04/01/2023 CLINICAL DATA:  Assess intracranial arteries. EXAM: CT ANGIOGRAPHY HEAD AND NECK WITH AND WITHOUT  CONTRAST TECHNIQUE: Multidetector CT imaging of the head and neck was performed using the standard protocol during bolus administration of intravenous contrast. Multiplanar CT image reconstructions and MIPs were obtained to evaluate the vascular anatomy. Carotid stenosis measurements (when applicable) are obtained utilizing NASCET criteria, using the distal internal carotid diameter as the denominator. RADIATION DOSE REDUCTION: This exam was performed according to the departmental dose-optimization program which includes automated exposure control, adjustment of the mA and/or kV according to patient size and/or use of iterative reconstruction technique. CONTRAST:  75 cc Omnipaque 350 intravenous COMPARISON:  Head CT from earlier today. CT of the head neck 04/27/2022 FINDINGS: CTA NECK FINDINGS Aortic arch: Atheromatous calcification with 3 vessel branching. Right carotid system: Atheromatous plaque at the proximal ICA is mild. No flow reducing stenosis Left carotid system: Vessels are smoothly contoured and  widely patent with less than typical atheromatous change Vertebral arteries: No proximal subclavian or vertebral stenosis. No beading or dissection. Skeleton: Negative. Other neck: Negative. Upper chest: Negative Review of the MIP images confirms the above findings CTA HEAD FINDINGS Anterior circulation: No significant stenosis, proximal occlusion, aneurysm, or vascular malformation. Atheromatous calcification of the carotid siphons. Posterior circulation: The vertebral and basilar arteries are smoothly contoured and diffusely patent. Atheromatous irregularity of the posterior cerebral arteries asymmetric to the left but mild. No proximal flow reducing stenosis. Venous sinuses: Unremarkable Anatomic variants: Unremarkable Review of the MIP images confirms the above findings IMPRESSION: 1. No emergent finding including large vessel occlusion. 2. Mild for age atherosclerosis. No flow reducing stenosis or  irregularity of major arteries in the head and neck. Electronically Signed   By: Tiburcio Pea M.D.   On: 04/01/2023 13:07   CT HEAD CODE STROKE WO CONTRAST  Result Date: 04/01/2023 CLINICAL DATA:  Code stroke.  Unresponsive EXAM: CT HEAD WITHOUT CONTRAST TECHNIQUE: Contiguous axial images were obtained from the base of the skull through the vertex without intravenous contrast. RADIATION DOSE REDUCTION: This exam was performed according to the departmental dose-optimization program which includes automated exposure control, adjustment of the mA and/or kV according to patient size and/or use of iterative reconstruction technique. COMPARISON:  04/27/2022 brain MRI FINDINGS: Brain: No evidence of acute infarction, hemorrhage, hydrocephalus, extra-axial collection or mass lesion/mass effect. Chronic small vessel ischemic gliosis in the cerebral white matter that is confluent. Small chronic left cerebellar infarct also seen on prior brain MRI. There is cerebral volume loss with ventriculomegaly. High-density in the subcortical right parietal lobe measuring 6 mm, stable based on prior reformats and with T1 hyperintensity based on prior brain MRI, possible cavernoma. Vascular: No hyperdense vessel or unexpected calcification. Skull: Normal. Negative for fracture or focal lesion. Sinuses/Orbits: No acute finding. Other: Prelim sent in epic chat. ASPECTS [facility] Stroke Program Early CT Score) Not scored with this history IMPRESSION: 1. No acute finding. 2. Extensive chronic small vessel disease. 3. Chronic mineralization along the right parietal cortex, possible cavernoma. Electronically Signed   By: Tiburcio Pea M.D.   On: 04/01/2023 12:50    Procedures .Critical Care  Performed by: Terrilee Files, MD Authorized by: Terrilee Files, MD   Critical care provider statement:    Critical care time (minutes):  45   Critical care time was exclusive of:  Separately billable procedures and treating other  patients   Critical care was necessary to treat or prevent imminent or life-threatening deterioration of the following conditions:  CNS failure or compromise   Critical care was time spent personally by me on the following activities:  Development of treatment plan with patient or surrogate, discussions with consultants, evaluation of patient's response to treatment, examination of patient, obtaining history from patient or surrogate, ordering and performing treatments and interventions, ordering and review of laboratory studies, ordering and review of radiographic studies, pulse oximetry, re-evaluation of patient's condition and review of old charts   I assumed direction of critical care for this patient from another provider in my specialty: no       Medications Ordered in ED Medications  levETIRAcetam (KEPPRA) IVPB 1000 mg/100 mL premix (0 mg Intravenous Stopped 04/01/23 1327)  LORazepam (ATIVAN) injection 2 mg (has no administration in time range)  sodium chloride flush (NS) 0.9 % injection 3 mL (3 mLs Intravenous Given 04/01/23 1514)  acetaminophen (TYLENOL) tablet 650 mg (has no administration in time range)  Or  acetaminophen (TYLENOL) suppository 650 mg (has no administration in time range)  polyethylene glycol (MIRALAX / GLYCOLAX) packet 17 g (has no administration in time range)  bisacodyl (DULCOLAX) suppository 10 mg (has no administration in time range)  apixaban (ELIQUIS) tablet 2.5 mg (has no administration in time range)  insulin aspart (novoLOG) injection 0-9 Units ( Subcutaneous Not Given 04/01/23 1607)  levETIRAcetam (KEPPRA) IVPB 500 mg/100 mL premix (has no administration in time range)  LORazepam (ATIVAN) 2 MG/ML injection (2 mg  Given 04/01/23 1235)  levETIRAcetam (KEPPRA) 1000 MG/100ML IVPB (0 mg  Stopped 04/01/23 1327)  labetalol (NORMODYNE) 5 MG/ML injection (  Given 04/01/23 1445)  LORazepam (ATIVAN) injection 2 mg (2 mg Intravenous Given 04/01/23 1446)  iohexol  (OMNIPAQUE) 350 MG/ML injection 75 mL (75 mLs Intravenous Contrast Given 04/01/23 1302)    ED Course/ Medical Decision Making/ A&P Clinical Course as of 04/01/23 2045  Thu Apr 01, 2023  1250 Patient is now in emergency department bed.  She is somnolent not responding.  She still has some mid and left gaze. [MB]  1311 Neurology Dr. Derry Lory is recommending medical admission progressive for continuous EEG.  She is getting Keppra loaded. [MB]  1358 Discussed with [facility] hospitalist Dr. Alinda Money who will evaluate patient for admission. [MB]    Clinical Course User Index [MB] Terrilee Files, MD                                 Medical Decision Making Risk Decision regarding hospitalization.   This patient complains of altered mental status possible seizure activity; this involves an extensive number of treatment Options and is a complaint that carries with it a high risk of complications and morbidity. The differential includes stroke, seizure, mass, bleed, status, metabolic derangement  I ordered, reviewed and interpreted labs, which included CBC normal, chemistries with elevated glucose,  I ordered medication IV Ativan and reviewed PMP when indicated. I ordered imaging studies which included head CT, CT angio, CT venogram and I independently    visualized and interpreted imaging which showed no acute findings Additional history obtained from EMS Previous records obtained and reviewed in epic including recent discharge summary and PCP notes I consulted neurology Dr. Derry Lory and discussed lab and imaging findings and discussed disposition.  Cardiac monitoring reviewed, sinus rhythm Social determinants considered, social isolation Critical Interventions: Stroke activation requiring bedside presence and assessment of airway neurologic status and assistance with neurology team for rapid workup  After the interventions stated above, I reevaluated the patient and found patient still to  be altered and possibly seizing Admission and further testing considered, she is being admitted to medical service for further neurologic evaluation including EEG         Final Clinical Impression(s) / ED Diagnoses Final diagnoses:  Acute encephalopathy  Seizure [facility])    Rx / DC Orders ED Discharge Orders     None         Terrilee Files, MD 04/01/23 2050

## 2023-04-01 NOTE — Code Documentation (Signed)
Stroke Response Nurse Documentation Code Documentation  Brandy Gonzales is a 82 y.o. female arriving to [facility]  via [facility] on 11/21/204 with past medical hx of hypertension, diabetes, GERD, renal insufficiency, Bell palsy, PVD and cognitive impairment. On aspirin 81 mg daily. Code stroke was activated by EMS.   Patient from home where she was LKW at 0740 and   Stroke team at the bedside on patient arrival. Noted focal seizures on the left with left gaze. Labs drawn and patient cleared for CT by Dr. Adela Lank. Patient to CT with team. Ativan given. NIHSS 22, see documentation for details and code stroke times. Patient with disoriented, not following commands, left gaze preference , right hemianopia, left facial droop, bilateral arm weakness, bilateral leg weakness, Receptive aphasia , and Visual  neglect on exam. The following imaging was completed:  CT Head and CTA. Patient is not a candidate for IV Thrombolytic due to out of window. Patient is not a candidate for IR due to no LVO. POA stated they have been trying to get her seen by a neurologist for dementia screening. And they have concerns she has a UTI currently.  Care Plan: Q2 hour VS and NIHSS, continuous EEG.   Bedside handoff with ED RN.    Ferman Hamming Stroke Response RN

## 2023-04-01 NOTE — Progress Notes (Signed)
Pt transferred to 3W. EEG now being monitored by [facility].

## 2023-04-01 NOTE — ED Triage Notes (Signed)
Per EMS and pt report, pt started having weakness and AMS around 12P. Pt arrived to ED having a seizure and left eye deviation.
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
## 2023-04-02 ENCOUNTER — Ambulatory Visit: Payer: Medicare Other | Admitting: Podiatry

## 2023-04-02 DIAGNOSIS — B9620 Unspecified Escherichia coli [E. coli] as the cause of diseases classified elsewhere: Secondary | ICD-10-CM | POA: Diagnosis present

## 2023-04-02 DIAGNOSIS — Z8673 Personal history of transient ischemic attack (TIA), and cerebral infarction without residual deficits: Secondary | ICD-10-CM | POA: Diagnosis not present

## 2023-04-02 DIAGNOSIS — E785 Hyperlipidemia, unspecified: Secondary | ICD-10-CM | POA: Diagnosis present

## 2023-04-02 DIAGNOSIS — Z8619 Personal history of other infectious and parasitic diseases: Secondary | ICD-10-CM | POA: Diagnosis not present

## 2023-04-02 DIAGNOSIS — E1122 Type 2 diabetes mellitus with diabetic chronic kidney disease: Secondary | ICD-10-CM | POA: Diagnosis present

## 2023-04-02 DIAGNOSIS — I129 Hypertensive chronic kidney disease with stage 1 through stage 4 chronic kidney disease, or unspecified chronic kidney disease: Secondary | ICD-10-CM | POA: Diagnosis present

## 2023-04-02 DIAGNOSIS — N1832 Chronic kidney disease, stage 3b: Secondary | ICD-10-CM | POA: Diagnosis present

## 2023-04-02 DIAGNOSIS — R4182 Altered mental status, unspecified: Secondary | ICD-10-CM | POA: Diagnosis not present

## 2023-04-02 DIAGNOSIS — R414 Neurologic neglect syndrome: Secondary | ICD-10-CM | POA: Diagnosis present

## 2023-04-02 DIAGNOSIS — Z8249 Family history of ischemic heart disease and other diseases of the circulatory system: Secondary | ICD-10-CM | POA: Diagnosis not present

## 2023-04-02 DIAGNOSIS — G9349 Other encephalopathy: Secondary | ICD-10-CM | POA: Diagnosis present

## 2023-04-02 DIAGNOSIS — I69198 Other sequelae of nontraumatic intracerebral hemorrhage: Secondary | ICD-10-CM | POA: Diagnosis not present

## 2023-04-02 DIAGNOSIS — N3281 Overactive bladder: Secondary | ICD-10-CM | POA: Diagnosis present

## 2023-04-02 DIAGNOSIS — I6782 Cerebral ischemia: Secondary | ICD-10-CM | POA: Diagnosis not present

## 2023-04-02 DIAGNOSIS — G309 Alzheimer's disease, unspecified: Secondary | ICD-10-CM | POA: Diagnosis present

## 2023-04-02 DIAGNOSIS — Z90710 Acquired absence of both cervix and uterus: Secondary | ICD-10-CM | POA: Diagnosis not present

## 2023-04-02 DIAGNOSIS — Z833 Family history of diabetes mellitus: Secondary | ICD-10-CM | POA: Diagnosis not present

## 2023-04-02 DIAGNOSIS — H409 Unspecified glaucoma: Secondary | ICD-10-CM | POA: Diagnosis present

## 2023-04-02 DIAGNOSIS — Z95828 Presence of other vascular implants and grafts: Secondary | ICD-10-CM | POA: Diagnosis not present

## 2023-04-02 DIAGNOSIS — R569 Unspecified convulsions: Secondary | ICD-10-CM | POA: Diagnosis not present

## 2023-04-02 DIAGNOSIS — Z79899 Other long term (current) drug therapy: Secondary | ICD-10-CM | POA: Diagnosis not present

## 2023-04-02 DIAGNOSIS — Z86718 Personal history of other venous thrombosis and embolism: Secondary | ICD-10-CM | POA: Diagnosis not present

## 2023-04-02 DIAGNOSIS — K219 Gastro-esophageal reflux disease without esophagitis: Secondary | ICD-10-CM | POA: Diagnosis present

## 2023-04-02 DIAGNOSIS — N390 Urinary tract infection, site not specified: Secondary | ICD-10-CM | POA: Diagnosis present

## 2023-04-02 DIAGNOSIS — R9082 White matter disease, unspecified: Secondary | ICD-10-CM | POA: Diagnosis not present

## 2023-04-02 DIAGNOSIS — F0280 Dementia in other diseases classified elsewhere without behavioral disturbance: Secondary | ICD-10-CM | POA: Diagnosis present

## 2023-04-02 DIAGNOSIS — G40901 Epilepsy, unspecified, not intractable, with status epilepticus: Secondary | ICD-10-CM | POA: Diagnosis present

## 2023-04-02 DIAGNOSIS — D631 Anemia in chronic kidney disease: Secondary | ICD-10-CM | POA: Diagnosis present

## 2023-04-02 DIAGNOSIS — Z841 Family history of disorders of kidney and ureter: Secondary | ICD-10-CM | POA: Diagnosis not present

## 2023-04-02 LAB — URINALYSIS, ROUTINE W REFLEX MICROSCOPIC
Bilirubin Urine: NEGATIVE
Glucose, UA: NEGATIVE mg/dL
Ketones, ur: NEGATIVE mg/dL
Nitrite: POSITIVE — AB
Protein, ur: 100 mg/dL — AB
Specific Gravity, Urine: 1.035 — ABNORMAL HIGH (ref 1.005–1.030)
WBC, UA: >50 WBC/hpf (ref 0–5)
pH: 5 (ref 5.0–8.0)

## 2023-04-02 LAB — COMPREHENSIVE METABOLIC PANEL
ALT: 32 U/L (ref 0–44)
AST: 26 U/L (ref 15–41)
Albumin: 3.1 g/dL — ABNORMAL LOW (ref 3.5–5.0)
Alkaline Phosphatase: 113 U/L (ref 38–126)
Anion gap: 15 (ref 5–15)
BUN: 20 mg/dL (ref 8–23)
CO2: 14 mmol/L — ABNORMAL LOW (ref 22–32)
Calcium: 9.6 mg/dL (ref 8.9–10.3)
Chloride: 113 mmol/L — ABNORMAL HIGH (ref 98–111)
Creatinine, Ser: 1.28 mg/dL — ABNORMAL HIGH (ref 0.44–1.00)
GFR, Estimated: 42 mL/min — ABNORMAL LOW (ref >60)
Glucose, Bld: 76 mg/dL (ref 70–99)
Potassium: 4.8 mmol/L (ref 3.5–5.1)
Sodium: 142 mmol/L (ref 135–145)
Total Bilirubin: 1 mg/dL (ref <1.2)
Total Protein: 5.6 g/dL — ABNORMAL LOW (ref 6.5–8.1)

## 2023-04-02 LAB — GLUCOSE, CAPILLARY
Glucose-Capillary: 104 mg/dL — ABNORMAL HIGH (ref 70–99)
Glucose-Capillary: 73 mg/dL (ref 70–99)
Glucose-Capillary: 83 mg/dL (ref 70–99)
Glucose-Capillary: 84 mg/dL (ref 70–99)
Glucose-Capillary: 88 mg/dL (ref 70–99)
Glucose-Capillary: 95 mg/dL (ref 70–99)

## 2023-04-02 LAB — RAPID URINE DRUG SCREEN, HOSP PERFORMED
Amphetamines: NOT DETECTED
Barbiturates: NOT DETECTED
Benzodiazepines: POSITIVE — AB
Cocaine: NOT DETECTED
Opiates: NOT DETECTED
Tetrahydrocannabinol: NOT DETECTED

## 2023-04-02 LAB — CBC
HCT: 41 % (ref 36.0–46.0)
Hemoglobin: 13.1 g/dL (ref 12.0–15.0)
MCH: 30.8 pg (ref 26.0–34.0)
MCHC: 32 g/dL (ref 30.0–36.0)
MCV: 96.2 fL (ref 80.0–100.0)
Platelets: 142 10*3/uL — ABNORMAL LOW (ref 150–400)
RBC: 4.26 MIL/uL (ref 3.87–5.11)
RDW: 13.2 % (ref 11.5–15.5)
WBC: 9.1 10*3/uL (ref 4.0–10.5)
nRBC: 0 % (ref 0.0–0.2)

## 2023-04-02 MED ORDER — LEVETIRACETAM 500 MG/5ML IV SOLN
750.0000 mg | Freq: Two times a day (BID) | INTRAVENOUS | Status: DC
  Administered 2023-04-02 – 2023-04-04 (×4): 750 mg via INTRAVENOUS
  Filled 2023-04-02 (×5): qty 7.5

## 2023-04-02 MED ORDER — SODIUM CHLORIDE 0.9 % IV SOLN
1.0000 g | INTRAVENOUS | Status: DC
  Administered 2023-04-02 – 2023-04-03 (×2): 1 g via INTRAVENOUS
  Filled 2023-04-02 (×2): qty 10

## 2023-04-02 MED ORDER — LORAZEPAM 0.5 MG PO TABS
0.5000 mg | ORAL_TABLET | Freq: Once | ORAL | Status: AC | PRN
  Administered 2023-04-03: 0.5 mg via ORAL
  Filled 2023-04-02 (×2): qty 1

## 2023-04-02 MED ORDER — LEVETIRACETAM IN NACL 1000 MG/100ML IV SOLN
1000.0000 mg | Freq: Once | INTRAVENOUS | Status: AC
  Administered 2023-04-02: 1000 mg via INTRAVENOUS
  Filled 2023-04-02: qty 100

## 2023-04-02 MED ORDER — CARVEDILOL 12.5 MG PO TABS
12.5000 mg | ORAL_TABLET | Freq: Two times a day (BID) | ORAL | Status: DC
  Administered 2023-04-02 – 2023-04-04 (×4): 12.5 mg via ORAL
  Filled 2023-04-02 (×4): qty 1

## 2023-04-02 NOTE — Progress Notes (Addendum)
PROGRESS NOTE    Brandy Gonzales  WUJ:[id number] DOB: Apr 29, 1941 DOA: 04/01/2023 PCP: Tresa Garter, MD  Chief Complaint  Patient presents with   Seizures   Weakness    Brief Narrative:   Brandy Gonzales is Brandy Gonzales 82 y.o. female with medical history significant of dementia, hypertension, hyperlipidemia, GERD, CKD 3B, anemia, DVT, glaucoma, OAB presenting with altered mental status as Brandy Gonzales code stroke.   Found to have status epilpeticus.   Assessment & Plan:   Principal Problem:   Seizure (HCC) Active Problems:   HTN (hypertension)   History of insertion of IVC (inferior vena caval) filter 2006   GERD   Glaucoma   OAB (overactive bladder)   Stage 3b chronic kidney disease (CKD) (HCC)   Dyslipidemia   History of DVT (deep vein thrombosis)   Anemia   Type 2 diabetes mellitus with diabetic chronic kidney disease (HCC)   Alzheimer's dementia (HCC)  Seizures Status Epilipeticus Altered mental status R/O CVA > Patient arrived as Brandy Gonzales code stroke with altered mental status and reported leftward gaze with left-sided shaking. > Met by neurology in the ED with suspicion for focal seizure and so received Ativan on await imaging. > Imaging included CT head (no acute findings), CTA head and neck (no emergent finding including LVO, mild for age atherosclerosis, no flow reducing stenosis or irregularity of major arteries in the head and neck), CT venogram without acute abnormality. EEG with electrographic status epilepticus arising from right temporoparietal region.  As anti seizure medicines adjusted, status epilepticus resolved on 11/21 at 1411.  Subsequent EEG suggestive of moderate diffuse encephalopathy.   Keppra 500 mg BID Planning for MRI after LTM EEG Appreciate neurology assistance    UTI Dysuria and UA concerning for UTI.  Ceftriaxone for suspected UTI  Urinary Retention Needing intermittent cath If she continues to require, will need indwelling cath   Hypertension Coreg at  reduced dose   Hyperlipidemia Not on meds   GERD Continue PPI when tolerating p.o.   CKD 3B Creatinine fluctuating   History of DVT Not on anticoagulation at home IVC filter in place..  Diabetes A1c 01/2023, 6 Monitor BG's    DVT prophylaxis: eliquis looks like this was started because lovenox and heparin declined due to pork containing products and creatinine too low for xarelto  Code Status: full Family Communication: family at bedside Disposition:   Status is: Observation The patient will require care spanning > 2 midnights and should be moved to inpatient because: need for continued neurologic care   Consultants:  neurology  Procedures:  EEG IMPRESSION: At the beginning of the study, EEG was consistent with electrographic status epilepticus arising from right temporoparietal region.  As anti-seizure medications were adjusted, status epilepticus resolved on 04/01/2023 at 1411.  Subsequently EEG was suggestive of moderate diffuse encephalopathy.  Antimicrobials:  Anti-infectives (From admission, onward)    Start     Dose/Rate Route Frequency Ordered Stop   04/02/23 1230  cefTRIAXone (ROCEPHIN) 1 g in sodium chloride 0.9 % 100 mL IVPB        1 g 200 mL/hr over 30 Minutes Intravenous Every 24 hours 04/02/23 1140 04/07/23 1229       Subjective: No complaints Granddaughters and daughters at bedside - concerned about her AMS  Objective: Vitals:   04/02/23 0400 04/02/23 0743 04/02/23 0745 04/02/23 1154  BP: 111/68 (!) 146/81 (!) 146/81 (!) 161/77  Pulse: 75  62 64  Resp: 16 14 14 16   Temp: 97.8 F (  36.6 C) 98.5 F (36.9 C) 98.5 F (36.9 C) 98.2 F (36.8 C)  TempSrc: Axillary Axillary Oral Oral  SpO2: 98% 100% 100% 100%  Weight:      Height:        Intake/Output Summary (Last 24 hours) at 04/02/2023 1313 Last data filed at 04/02/2023 1047 Gross per 24 hour  Intake 386.57 ml  Output 575 ml  Net -188.43 ml   Filed Weights   04/01/23 1249  Weight:  48.4 kg    Examination:  General exam: Appears calm and comfortable  Respiratory system: unlabored Cardiovascular system: RRR Gastrointestinal system: Abdomen is nondistended, soft and nontender Central nervous system: lethargic, follows instructions inconsistently, only partially opens her eyes and mumbles answers to my questions (though generally understandable)  Extremities: no LEE   Data Reviewed: I have personally reviewed following labs and imaging studies  CBC: Recent Labs  Lab 04/01/23 1232 04/01/23 1241 04/02/23 0520  WBC 7.1  --  9.1  NEUTROABS 4.5  --   --   HGB 12.0 11.6* 13.1  HCT 36.5 34.0* 41.0  MCV 94.8  --  96.2  PLT 156  --  142*    Basic Metabolic Panel: Recent Labs  Lab 04/01/23 1241 04/01/23 2029 04/02/23 0520  NA 143 140 142  K 4.9 4.2 4.8  CL 116* 114* 113*  CO2  --  19* 14*  GLUCOSE 122* 110* 76  BUN 24* 18 20  CREATININE 1.00 1.11* 1.28*  CALCIUM  --  9.9 9.6    GFR: Estimated Creatinine Clearance: 22.7 mL/min (Brandy Gonzales) (by C-G formula based on SCr of 1.28 mg/dL (H)).  Liver Function Tests: Recent Labs  Lab 04/01/23 2029 04/02/23 0520  AST 30 26  ALT 35 32  ALKPHOS 125 113  BILITOT 1.0 1.0  PROT 6.1* 5.6*  ALBUMIN 3.3* 3.1*    CBG: Recent Labs  Lab 04/01/23 2037 04/01/23 2359 04/02/23 0444 04/02/23 0739 04/02/23 1149  GLUCAP 115* 104* 88 73 83     No results found for this or any previous visit (from the past 240 hour(s)).       Radiology Studies: Overnight EEG with video  Result Date: 04/02/2023 Brandy Quest, MD     04/02/2023  8:28 AM Patient Name: Brandy Gonzales MRN: [id number] Epilepsy Attending: [name] Referring Physician/Provider: Erick Blinks, MD Duration: 04/01/2023 1403 to 04/02/2023 0820 Patient history: 82 yo F brought in with forced L gaze, L arm and L leg rhythmic twitching. EEG to evaluate for seizure Level of alertness: Awake, asleep AEDs during EEG study: LEV Technical aspects: This  EEG study was done with scalp electrodes positioned according to the 10-20 International system of electrode placement. Electrical activity was reviewed with band pass filter of 1-70Hz , sensitivity of 7 uV/mm, display speed of 45mm/sec with Brandy Gonzales 60Hz  notched filter applied as appropriate. EEG data were recorded continuously and digitally stored.  Video monitoring was available and reviewed as appropriate. Description: At the beginning of the study, EEG showed sharply contoured 5 to 6 Hz theta slowing in right hemisphere, maximal right temporoparietal region admixed with sharp waves.  Gradually EEG evolved into 2 to 3 Hz delta slowing.  No clinical signs were noted on video.  This EEG pattern was consistent with focal electrographic status epilepticus.  As antiseizure medications were adjusted, status epilepticus resolved on 04/01/2023 at 1411. Subsequently EEG showed continuous generalized 3-7hz  theta-delta slowing.  Gradually EEG improved and showed posterior dominant rhythm of 6 Hz activity of  moderate voltage (25-35 uV) seen predominantly in posterior head regions, symmetric and reactive to eye opening and eye closing. Sleep was characterized by vertex waves, sleep spindles (12 to 14 Hz), maximal frontocentral region. Hyperventilation and photic stimulation were not performed.   ABNORMALITY -Electrographic status epilepticus, right temporoparietal region - Continuous slow, generalized IMPRESSION: At the beginning of the study, EEG was consistent with electrographic status epilepticus arising from right temporoparietal region.  As anti-seizure medications were adjusted, status epilepticus resolved on 04/01/2023 at 1411.  Subsequently EEG was suggestive of moderate diffuse encephalopathy. Brandy Gonzales   CT VENOGRAM HEAD  Result Date: 04/01/2023 CLINICAL DATA:  Code stroke.  Subcortical high density by head CT. EXAM: CT VENOGRAM HEAD TECHNIQUE: Venographic phase images of the brain were obtained following the  administration of intravenous contrast. Multiplanar reformats and maximum intensity projections were generated. RADIATION DOSE REDUCTION: This exam was performed according to the departmental dose-optimization program which includes automated exposure control, adjustment of the mA and/or kV according to patient size and/or use of iterative reconstruction technique. CONTRAST:  75mL OMNIPAQUE IOHEXOL 350 MG/ML SOLN COMPARISON:  Preceding head CT. FINDINGS: The dural venous sinuses are diffusely patent no stricture or diverticulum seen. No evidence of central or cortical venous thrombosis. The subcortical high density in the right parietal region is again noted without convincing adjacent enhancement. No abnormal parenchymal enhancement seen throughout the brain. IMPRESSION: No dural venous sinus thrombosis or other acute venous finding. Electronically Signed   By: Brandy Pea M.D.   On: 04/01/2023 13:10   CT ANGIO HEAD NECK W WO CM (CODE STROKE)  Result Date: 04/01/2023 CLINICAL DATA:  Assess intracranial arteries. EXAM: CT ANGIOGRAPHY HEAD AND NECK WITH AND WITHOUT CONTRAST TECHNIQUE: Multidetector CT imaging of the head and neck was performed using the standard protocol during bolus administration of intravenous contrast. Multiplanar CT image reconstructions and MIPs were obtained to evaluate the vascular anatomy. Carotid stenosis measurements (when applicable) are obtained utilizing NASCET criteria, using the distal internal carotid diameter as the denominator. RADIATION DOSE REDUCTION: This exam was performed according to the departmental dose-optimization program which includes automated exposure control, adjustment of the mA and/or kV according to patient size and/or use of iterative reconstruction technique. CONTRAST:  75 cc Omnipaque 350 intravenous COMPARISON:  Head CT from earlier today. CT of the head neck 04/27/2022 FINDINGS: CTA NECK FINDINGS Aortic arch: Atheromatous calcification with 3 vessel  branching. Right carotid system: Atheromatous plaque at the proximal ICA is mild. No flow reducing stenosis Left carotid system: Vessels are smoothly contoured and widely patent with less than typical atheromatous change Vertebral arteries: No proximal subclavian or vertebral stenosis. No beading or dissection. Skeleton: Negative. Other neck: Negative. Upper chest: Negative Review of the MIP images confirms the above findings CTA HEAD FINDINGS Anterior circulation: No significant stenosis, proximal occlusion, aneurysm, or vascular malformation. Atheromatous calcification of the carotid siphons. Posterior circulation: The vertebral and basilar arteries are smoothly contoured and diffusely patent. Atheromatous irregularity of the posterior cerebral arteries asymmetric to the left but mild. No proximal flow reducing stenosis. Venous sinuses: Unremarkable Anatomic variants: Unremarkable Review of the MIP images confirms the above findings IMPRESSION: 1. No emergent finding including large vessel occlusion. 2. Mild for age atherosclerosis. No flow reducing stenosis or irregularity of major arteries in the head and neck. Electronically Signed   By: Brandy Pea M.D.   On: 04/01/2023 13:07   CT HEAD CODE STROKE WO CONTRAST  Result Date: 04/01/2023 CLINICAL DATA:  Code stroke.  Unresponsive EXAM: CT HEAD WITHOUT CONTRAST TECHNIQUE: Contiguous axial images were obtained from the base of the skull through the vertex without intravenous contrast. RADIATION DOSE REDUCTION: This exam was performed according to the departmental dose-optimization program which includes automated exposure control, adjustment of the mA and/or kV according to patient size and/or use of iterative reconstruction technique. COMPARISON:  04/27/2022 brain MRI FINDINGS: Brain: No evidence of acute infarction, hemorrhage, hydrocephalus, extra-axial collection or mass lesion/mass effect. Chronic small vessel ischemic gliosis in the cerebral white  matter that is confluent. Small chronic left cerebellar infarct also seen on prior brain MRI. There is cerebral volume loss with ventriculomegaly. High-density in the subcortical right parietal lobe measuring 6 mm, stable based on prior reformats and with T1 hyperintensity based on prior brain MRI, possible cavernoma. Vascular: No hyperdense vessel or unexpected calcification. Skull: Normal. Negative for fracture or focal lesion. Sinuses/Orbits: No acute finding. Other: Prelim sent in epic chat. ASPECTS [facility] Stroke Program Early CT Score) Not scored with this history IMPRESSION: 1. No acute finding. 2. Extensive chronic small vessel disease. 3. Chronic mineralization along the right parietal cortex, possible cavernoma. Electronically Signed   By: Brandy Pea M.D.   On: 04/01/2023 12:50        Scheduled Meds:  apixaban  2.5 mg Oral BID   insulin aspart  0-9 Units Subcutaneous Q4H   sodium chloride flush  3 mL Intravenous Q12H   Continuous Infusions:  cefTRIAXone (ROCEPHIN)  IV 1 g (04/02/23 1217)   levETIRAcetam Stopped (04/02/23 0918)     LOS: 0 days    Time spent: over 30 min    Lacretia Nicks, MD [facility]   To contact the attending provider between 7A-7P or the covering provider during after hours 7P-7A, please log into the web site [email or web address] and access using universal [place] password for that web site. If you do not have the password, please call the hospital operator.  04/02/2023, 1:13 PM

## 2023-04-02 NOTE — Progress Notes (Signed)
0.5 PO ativan order placed by Loney Loh, MD prior to MRI. Patient refusing to take this. Will continue to try throughout the night.

## 2023-04-02 NOTE — Procedures (Addendum)
Patient Name: Brandy Gonzales  MRN: [id number]  Epilepsy Attending: Charlsie Quest  Referring Physician/Provider: Erick Blinks, MD  Duration: 04/01/2023 1403 to 04/02/2023 1403  Patient history: 82 yo F brought in with forced L gaze, L arm and L leg rhythmic twitching. EEG to evaluate for seizure  Level of alertness: Awake, asleep  AEDs during EEG study: LEV  Technical aspects: This EEG study was done with scalp electrodes positioned according to the 10-20 International system of electrode placement. Electrical activity was reviewed with band pass filter of 1-70Hz , sensitivity of 7 uV/mm, display speed of 79mm/sec with a 60Hz  notched filter applied as appropriate. EEG data were recorded continuously and digitally stored.  Video monitoring was available and reviewed as appropriate.  Description: At the beginning of the study, EEG showed sharply contoured 5 to 6 Hz theta slowing in right hemisphere, maximal right temporoparietal region admixed with sharp waves.  Gradually EEG evolved into 2 to 3 Hz delta slowing.  No clinical signs were noted on video.  This EEG pattern was consistent with focal electrographic status epilepticus.  As antiseizure medications were adjusted, status epilepticus resolved on 04/01/2023 at 1411. Subsequently EEG showed continuous generalized 3-7hz  theta-delta slowing.  Gradually EEG improved and showed posterior dominant rhythm of 6 Hz activity of moderate voltage (25-35 uV) seen predominantly in posterior head regions, symmetric and reactive to eye opening and eye closing. Sleep was characterized by vertex waves, sleep spindles (12 to 14 Hz), maximal frontocentral region. Hyperventilation and photic stimulation were not performed.     ABNORMALITY -Electrographic status epilepticus, right temporoparietal region - Continuous slow, generalized  IMPRESSION: At the beginning of the study, EEG was consistent with electrographic status epilepticus arising from right  temporoparietal region.  As anti-seizure medications were adjusted, status epilepticus resolved on 04/01/2023 at 1411.  Subsequently EEG was suggestive of moderate diffuse encephalopathy.  [name]

## 2023-04-02 NOTE — Progress Notes (Signed)
Student nurse and this RN were doing an in and out cath when patient became more alert and oriented. Alert to self and daughter- following commands at that time. Charted new neuro assessment for that time.

## 2023-04-02 NOTE — Progress Notes (Signed)
EEG was getting increasingly concerning over the day prior to disconnection, but she appears clinically stable currently. She is able to follow commands in all 4 ext, able to cross midline to the left, but with some left neglect.   Will increase keppra and resume EEG monitoring.   Ritta Slot, MD [facility] [phone]  If 7pm- 7am, please page neurology on call as listed in AMION.

## 2023-04-02 NOTE — Progress Notes (Signed)
LTM EEG discontinued - no skin breakdown at unhook.  

## 2023-04-02 NOTE — Procedures (Signed)
Patient Name: Brandy Gonzales  MRN: [id number]  Epilepsy Attending: Charlsie Quest  Referring Physician/Provider: Erick Blinks, MD  Duration: 04/02/2023 1403 to 04/02/2023 1615   Patient history: 82 yo F brought in with forced L gaze, L arm and L leg rhythmic twitching. EEG to evaluate for seizure   Level of alertness: Awake, asleep   AEDs during EEG study: LEV   Technical aspects: This EEG study was done with scalp electrodes positioned according to the 10-20 International system of electrode placement. Electrical activity was reviewed with band pass filter of 1-70Hz , sensitivity of 7 uV/mm, display speed of 42mm/sec with a 60Hz  notched filter applied as appropriate. EEG data were recorded continuously and digitally stored.  Video monitoring was available and reviewed as appropriate.   Description:  The posterior dominant rhythm consists of 7 Hz activity of moderate voltage (25-35 uV) seen predominantly in posterior head regions, symmetric and reactive to eye opening and eye closing. Sleep was characterized by vertex waves, sleep spindles (12 to 14 Hz), maximal frontocentral region. EEG showed continuous generalized 3 to 6 Hz theta-delta slowing. Runs of polyspikes were noted arising from right temporoparietal region lasting 2-4 seconds, qasi periodic every 1-4 seconds. Hyperventilation and photic stimulation were not performed.      ABNORMALITY - Spikes, right temporoparietal region - Continuous slow, generalized   IMPRESSION: This study showed evidence of epileptogenicity arising from right temporoparietal region. Due to the frequent run of spikes, this EEG pattern is on the ictal-interictal region with increased risk of seizure recurrence. Additionally there is moderate diffuse encephalopathy. No definite seizures were seen throughout the recording.  Malacki Mcphearson Annabelle Harman

## 2023-04-02 NOTE — Progress Notes (Signed)
NEUROLOGY CONSULT FOLLOW UP NOTE   Date of service: April 02, 2023 Patient Name: Brandy Gonzales MRN:  [id number] DOB:  11/28/1940  Brief HPI  Brandy Gonzales is a 82 y.o. 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 Resolved clinically with Ativan 2mg  and Keppra 2000mg . When she was put on LTM EEG, was in electrographic status epilepticus arising from right temporoparietal region.  As anti-seizure medications were adjusted, status epilepticus resolved on 04/01/2023 at 1411.   Interval Hx/subjective   She is improving. Still favors R side over left but moving all extremities.  Vitals   Vitals:   04/02/23 0400 04/02/23 0743 04/02/23 0745 04/02/23 1154  BP: 111/68 (!) 146/81 (!) 146/81 (!) 161/77  Pulse: 75  62 64  Resp: 16 14 14 16   Temp: 97.8 F (36.6 C) 98.5 F (36.9 C) 98.5 F (36.9 C) 98.2 F (36.8 C)  TempSrc: Axillary Axillary Oral Oral  SpO2: 98% 100% 100% 100%  Weight:      Height:         Body mass index is 21.55 kg/m.  Physical Exam   General: Laying comfortably in bed; in no acute distress. HENT: Normal oropharynx and mucosa. Normal external appearance of ears and nose.  Neck: Supple, no pain or tenderness  CV: No JVD. No peripheral edema.  Pulmonary: Symmetric Chest rise. Normal respiratory effort.  Abdomen: Soft to touch, non-tender.  Ext: No cyanosis, edema, or deformity  Skin: No rash. Normal palpation of skin.   Musculoskeletal: Normal digits and nails by inspection. No clubbing.   Neurologic Examination  Mental status/Cognition: Alert, oriented to self. Speech/language: Fluent, comprehension intact, gets upset at me and asks to be left along. Cranial nerves:   CN II Pupils equal and reactive to light, no VF deficits    CN III,IV,VI EOM intact, no gaze preference or deviation, no nystagmus    CN V  normal sensation in V1, V2, and V3 segments bilaterally    CN VII no asymmetry, no nasolabial fold flattening    CN VIII normal hearing to speech    CN IX & X normal palatal elevation, no uvular deviation    CN XI 5/5 head turn and 5/5 shoulder shrug bilaterally    CN XII midline tongue protrusion    Motor:  Muscle bulk: poor, tone normal  Moves BL upper extremities spontaneously and antigravity. Still favors right side over left side. Withdraws BL lower extremities to babinskis.  Labs and Diagnostic Imaging   CBC:  Recent Labs  Lab 04/01/23 1232 04/01/23 1241 04/02/23 0520  WBC 7.1  --  9.1  NEUTROABS 4.5  --   --   HGB 12.0 11.6* 13.1  HCT 36.5 34.0* 41.0  MCV 94.8  --  96.2  PLT 156  --  142*    Basic Metabolic Panel:  Lab Results  Component Value Date   NA 142 04/02/2023   K 4.8 04/02/2023   CO2 14 (L) 04/02/2023   GLUCOSE 76 04/02/2023   BUN 20 04/02/2023   CREATININE 1.28 (H) 04/02/2023   CALCIUM 9.6 04/02/2023   GFRNONAA 42 (L) 04/02/2023   GFRAA 33 (L) 05/03/2019   Lipid Panel:  Lab Results  Component Value Date   LDLCALC 72 10/21/2021   HgbA1c:  Lab Results  Component Value Date   HGBA1C 6.0 (H) 01/30/2023   Urine Drug Screen:  Component Value Date/Time   LABOPIA NONE DETECTED 04/02/2023 0146   COCAINSCRNUR NONE DETECTED 04/02/2023 0146   LABBENZ POSITIVE (A) 04/02/2023 0146   AMPHETMU NONE DETECTED 04/02/2023 0146   THCU NONE DETECTED 04/02/2023 0146   LABBARB NONE DETECTED 04/02/2023 0146    Alcohol Level     Component Value Date/Time   ETH <10 04/01/2023 1232   INR  Lab Results  Component Value Date   INR 1.1 04/01/2023   APTT  Lab Results  Component Value Date   APTT 30 04/01/2023   AED levels: No results found for: "PHENYTOIN", "ZONISAMIDE", "LAMOTRIGINE", "LEVETIRACETA"  CT Head without contrast(Personally reviewed): CTH was negative for a large hypodensity concerning for a large territory infarct or hyperdensity  concerning for an ICH   MRI Brain: pending  cEEG:  At the beginning of the study, EEG was consistent with electrographic status epilepticus arising from right temporoparietal region.  As anti-seizure medications were adjusted, status epilepticus resolved on 04/01/2023 at 1411.  Subsequently EEG was suggestive of moderate diffuse encephalopathy.   Impression   Brandy Gonzales is a 82 y.o. female presenting with focal R hemispheric status epilepticus. Perhaps could have been triggered by R parietal cavernoma/mineralization. No fever, labs not consistent with sepsis. No other clear seizure risk factors.  Recommendations  - continue Keppra 500 BID - discontinue LTM EEG - MRI Brain w/o contrast. ______________________________________________________________________  Plan discussed with Dr. Lowell Guitar and with patient's daughter over phone.  Signed, Erick Blinks, MD [facility]

## 2023-04-03 ENCOUNTER — Inpatient Hospital Stay (HOSPITAL_COMMUNITY): Payer: Medicare Other

## 2023-04-03 DIAGNOSIS — R569 Unspecified convulsions: Secondary | ICD-10-CM | POA: Diagnosis not present

## 2023-04-03 LAB — COMPREHENSIVE METABOLIC PANEL
ALT: 33 U/L (ref 0–44)
AST: 26 U/L (ref 15–41)
Albumin: 3.2 g/dL — ABNORMAL LOW (ref 3.5–5.0)
Alkaline Phosphatase: 119 U/L (ref 38–126)
Anion gap: 11 (ref 5–15)
BUN: 21 mg/dL (ref 8–23)
CO2: 18 mmol/L — ABNORMAL LOW (ref 22–32)
Calcium: 10 mg/dL (ref 8.9–10.3)
Chloride: 114 mmol/L — ABNORMAL HIGH (ref 98–111)
Creatinine, Ser: 1.2 mg/dL — ABNORMAL HIGH (ref 0.44–1.00)
GFR, Estimated: 45 mL/min — ABNORMAL LOW (ref >60)
Glucose, Bld: 122 mg/dL — ABNORMAL HIGH (ref 70–99)
Potassium: 3.8 mmol/L (ref 3.5–5.1)
Sodium: 143 mmol/L (ref 135–145)
Total Bilirubin: 1.1 mg/dL (ref <1.2)
Total Protein: 5.9 g/dL — ABNORMAL LOW (ref 6.5–8.1)

## 2023-04-03 LAB — CBC WITH DIFFERENTIAL/PLATELET
Abs Immature Granulocytes: 0.03 10*3/uL (ref 0.00–0.07)
Basophils Absolute: 0 10*3/uL (ref 0.0–0.1)
Basophils Relative: 0 %
Eosinophils Absolute: 0 10*3/uL (ref 0.0–0.5)
Eosinophils Relative: 0 %
HCT: 37 % (ref 36.0–46.0)
Hemoglobin: 12.3 g/dL (ref 12.0–15.0)
Immature Granulocytes: 0 %
Lymphocytes Relative: 19 %
Lymphs Abs: 1.4 10*3/uL (ref 0.7–4.0)
MCH: 30.6 pg (ref 26.0–34.0)
MCHC: 33.2 g/dL (ref 30.0–36.0)
MCV: 92 fL (ref 80.0–100.0)
Monocytes Absolute: 0.4 10*3/uL (ref 0.1–1.0)
Monocytes Relative: 5 %
Neutro Abs: 5.9 10*3/uL (ref 1.7–7.7)
Neutrophils Relative %: 76 %
Platelets: 187 10*3/uL (ref 150–400)
RBC: 4.02 MIL/uL (ref 3.87–5.11)
RDW: 12.8 % (ref 11.5–15.5)
WBC: 7.8 10*3/uL (ref 4.0–10.5)
nRBC: 0 % (ref 0.0–0.2)

## 2023-04-03 LAB — GLUCOSE, CAPILLARY
Glucose-Capillary: 104 mg/dL — ABNORMAL HIGH (ref 70–99)
Glucose-Capillary: 195 mg/dL — ABNORMAL HIGH (ref 70–99)
Glucose-Capillary: 226 mg/dL — ABNORMAL HIGH (ref 70–99)
Glucose-Capillary: 273 mg/dL — ABNORMAL HIGH (ref 70–99)
Glucose-Capillary: 87 mg/dL (ref 70–99)
Glucose-Capillary: 89 mg/dL (ref 70–99)
Glucose-Capillary: 94 mg/dL (ref 70–99)

## 2023-04-03 LAB — MAGNESIUM: Magnesium: 2 mg/dL (ref 1.7–2.4)

## 2023-04-03 LAB — PHOSPHORUS: Phosphorus: 3.2 mg/dL (ref 2.5–4.6)

## 2023-04-03 MED ORDER — DEXTROSE IN LACTATED RINGERS 5 % IV SOLN: INTRAVENOUS | Status: AC

## 2023-04-03 MED ORDER — HYDRALAZINE HCL 20 MG/ML IJ SOLN
5.0000 mg | Freq: Four times a day (QID) | INTRAMUSCULAR | Status: DC | PRN
  Administered 2023-04-03: 5 mg via INTRAVENOUS
  Filled 2023-04-03 (×2): qty 1

## 2023-04-03 NOTE — Plan of Care (Signed)
  Problem: Education: Goal: Ability to describe self-care measures that may prevent or decrease complications (Diabetes Survival Skills Education) will improve Outcome: Progressing Goal: Individualized Educational Video(s) Outcome: Progressing   Problem: Coping: Goal: Ability to adjust to condition or change in health will improve Outcome: Progressing   Problem: Fluid Volume: Goal: Ability to maintain a balanced intake and output will improve Outcome: Progressing   Problem: Health Behavior/Discharge Planning: Goal: Ability to identify and utilize available resources and services will improve Outcome: Progressing Goal: Ability to manage health-related needs will improve Outcome: Progressing   Problem: Metabolic: Goal: Ability to maintain appropriate glucose levels will improve Outcome: Progressing   Problem: Nutritional: Goal: Maintenance of adequate nutrition will improve Outcome: Progressing Goal: Progress toward achieving an optimal weight will improve Outcome: Progressing   Problem: Skin Integrity: Goal: Risk for impaired skin integrity will decrease Outcome: Progressing   Problem: Tissue Perfusion: Goal: Adequacy of tissue perfusion will improve Outcome: Progressing   Problem: Education: Goal: Knowledge of General Education information will improve Description: Including pain rating scale, medication(s)/side effects and non-pharmacologic comfort measures Outcome: Progressing   Problem: Health Behavior/Discharge Planning: Goal: Ability to manage health-related needs will improve Outcome: Progressing   Problem: Clinical Measurements: Goal: Ability to maintain clinical measurements within normal limits will improve Outcome: Progressing Goal: Will remain free from infection Outcome: Progressing Goal: Diagnostic test results will improve Outcome: Progressing Goal: Respiratory complications will improve Outcome: Progressing Goal: Cardiovascular complication will  be avoided Outcome: Progressing   Problem: Activity: Goal: Risk for activity intolerance will decrease Outcome: Progressing   Problem: Nutrition: Goal: Adequate nutrition will be maintained Outcome: Progressing   Problem: Coping: Goal: Level of anxiety will decrease Outcome: Progressing   Problem: Elimination: Goal: Will not experience complications related to bowel motility Outcome: Progressing Goal: Will not experience complications related to urinary retention Outcome: Progressing   Problem: Pain Management: Goal: General experience of comfort will improve Outcome: Progressing   Problem: Safety: Goal: Ability to remain free from injury will improve Outcome: Progressing   Problem: Skin Integrity: Goal: Risk for impaired skin integrity will decrease Outcome: Progressing   Problem: Education: Goal: Expressions of having a comfortable level of knowledge regarding the disease process will increase Outcome: Progressing   Problem: Coping: Goal: Ability to adjust to condition or change in health will improve Outcome: Progressing Goal: Ability to identify appropriate support needs will improve Outcome: Progressing   Problem: Health Behavior/Discharge Planning: Goal: Compliance with prescribed medication regimen will improve Outcome: Progressing   Problem: Medication: Goal: Risk for medication side effects will decrease Outcome: Progressing   Problem: Clinical Measurements: Goal: Complications related to the disease process, condition or treatment will be avoided or minimized Outcome: Progressing Goal: Diagnostic test results will improve Outcome: Progressing   Problem: Safety: Goal: Verbalization of understanding the information provided will improve Outcome: Progressing   Problem: Self-Concept: Goal: Level of anxiety will decrease Outcome: Progressing Goal: Ability to verbalize feelings about condition will improve Outcome: Progressing

## 2023-04-03 NOTE — Progress Notes (Signed)
PROGRESS NOTE    HEAVEN ENRIQUES  NWG:[id number] DOB: 1940-11-15 DOA: 04/01/2023 PCP: Tresa Garter, MD  Chief Complaint  Patient presents with   Seizures   Weakness    Brief Narrative:   KIRAH AREOLA is Jennika Ringgold 82 y.o. female with medical history significant of dementia, hypertension, hyperlipidemia, GERD, CKD 3B, anemia, DVT, glaucoma, OAB presenting with altered mental status as Michiah Masse code stroke.   Found to have status epilpeticus.   Assessment & Plan:   Principal Problem:   Seizure (HCC) Active Problems:   HTN (hypertension)   History of insertion of IVC (inferior vena caval) filter 2006   GERD   Glaucoma   OAB (overactive bladder)   Stage 3b chronic kidney disease (CKD) (HCC)   Dyslipidemia   History of DVT (deep vein thrombosis)   Anemia   Type 2 diabetes mellitus with diabetic chronic kidney disease (HCC)   Alzheimer's dementia (HCC)  Seizures Status Epilipeticus Altered mental status R/O CVA > Patient arrived as Bohdan Macho code stroke with altered mental status and reported leftward gaze with left-sided shaking. > Met by neurology in the ED with suspicion for focal seizure and so received Ativan on await imaging. > Imaging included CT head (no acute findings), CTA head and neck (no emergent finding including LVO, mild for age atherosclerosis, no flow reducing stenosis or irregularity of major arteries in the head and neck), CT venogram without acute abnormality. EEG with electrographic status epilepticus arising from right temporoparietal region.  As anti seizure medicines adjusted, status epilepticus resolved on 11/21 at 1411.  Subsequent EEG suggestive of moderate diffuse encephalopathy.   Keppra 750 mg BID Planning for MRI this AM and subsequently resuming LTM EEG Appreciate neurology assistance    UTI Dysuria and UA concerning for UTI.  Ceftriaxone for suspected UTI Urine culture with >100,000 CFU of gram negative rods  Urinary Retention Needing intermittent cath If  she continues to require, will need indwelling cath   Hypertension Coreg at reduced dose   Hyperlipidemia Not on meds   GERD Continue PPI when tolerating p.o.   CKD 3B Creatinine fluctuating Since not reliably taking PO, will start IVF   History of DVT Not on anticoagulation at home IVC filter in place..  Diabetes A1c 01/2023, 6 Monitor BG's    DVT prophylaxis: eliquis looks like this was started because lovenox and heparin declined due to pork containing products and creatinine too low for xarelto  Code Status: full Family Communication: family at bedside Disposition:   Status is: Observation The patient will require care spanning > 2 midnights and should be moved to inpatient because: need for continued neurologic care   Consultants:  neurology  Procedures:  EEG IMPRESSION: At the beginning of the study, EEG was consistent with electrographic status epilepticus arising from right temporoparietal region.  As anti-seizure medications were adjusted, status epilepticus resolved on 04/01/2023 at 1411.  Subsequently EEG was suggestive of moderate diffuse encephalopathy.  Antimicrobials:  Anti-infectives (From admission, onward)    Start     Dose/Rate Route Frequency Ordered Stop   04/02/23 1230  cefTRIAXone (ROCEPHIN) 1 g in sodium chloride 0.9 % 100 mL IVPB        1 g 200 mL/hr over 30 Minutes Intravenous Every 24 hours 04/02/23 1140 04/07/23 1229       Subjective: Daughter at bedside Patient c/o pain, generalized - can't localize (daughter notes she'll do this at home too)  Objective: Vitals:   04/03/23 0445 04/03/23 0600 04/03/23 5784  04/03/23 0800  BP: (!) 144/70  (!) 181/90 (!) 179/81  Pulse: 74  77   Resp: 18 14 15    Temp: 97.6 F (36.4 C)  98 F (36.7 C)   TempSrc: Axillary  Axillary   SpO2: 100%  100%   Weight:      Height:        Intake/Output Summary (Last 24 hours) at 04/03/2023 1050 Last data filed at 04/03/2023 1038 Gross per 24 hour   Intake 366.06 ml  Output 350 ml  Net 16.06 ml   Filed Weights   04/01/23 1249  Weight: 48.4 kg    Examination:  General: No acute distress. Cardiovascular: RRR Lungs: unlabored Neurological:improved from yesterday, though still lethargic, following commands, answering questions appropriately, moving all extremities Extremities: No clubbing or cyanosis. No edema   Data Reviewed: I have personally reviewed following labs and imaging studies  CBC: Recent Labs  Lab 04/01/23 1232 04/01/23 1241 04/02/23 0520 04/03/23 0425  WBC 7.1  --  9.1 7.8  NEUTROABS 4.5  --   --  5.9  HGB 12.0 11.6* 13.1 12.3  HCT 36.5 34.0* 41.0 37.0  MCV 94.8  --  96.2 92.0  PLT 156  --  142* 187    Basic Metabolic Panel: Recent Labs  Lab 04/01/23 1241 04/01/23 2029 04/02/23 0520 04/03/23 0425  NA 143 140 142 143  K 4.9 4.2 4.8 3.8  CL 116* 114* 113* 114*  CO2  --  19* 14* 18*  GLUCOSE 122* 110* 76 122*  BUN 24* 18 20 21   CREATININE 1.00 1.11* 1.28* 1.20*  CALCIUM  --  9.9 9.6 10.0  MG  --   --   --  2.0  PHOS  --   --   --  3.2    GFR: Estimated Creatinine Clearance: 24.2 mL/min (Lattie Riege) (by C-G formula based on SCr of 1.2 mg/dL (H)).  Liver Function Tests: Recent Labs  Lab 04/01/23 2029 04/02/23 0520 04/03/23 0425  AST 30 26 26   ALT 35 32 33  ALKPHOS 125 113 119  BILITOT 1.0 1.0 1.1  PROT 6.1* 5.6* 5.9*  ALBUMIN 3.3* 3.1* 3.2*    CBG: Recent Labs  Lab 04/02/23 1540 04/02/23 2012 04/03/23 0018 04/03/23 0449 04/03/23 0730  GLUCAP 84 95 87 89 94     Recent Results (from the past 240 hour(s))  Urine Culture (for pregnant, neutropenic or urologic patients or patients with an indwelling urinary catheter)     Status: Abnormal (Preliminary result)   Collection Time: 04/02/23  7:52 AM   Specimen: Urine, Clean Catch  Result Value Ref Range Status   Specimen Description URINE, CLEAN CATCH  Final   Special Requests   Final    NONE Performed at [facility],  [address] [place], [place] [address]    Culture >=100,000 COLONIES/mL Romie Minus NEGATIVE RODS (Madisin Hasan)  Final   Report Status PENDING  Incomplete         Radiology Studies: Overnight EEG with video  Result Date: 04/02/2023 Charlsie Quest, MD     04/02/2023  6:26 PM Patient Name: JANESSA WUERTZ MRN: [id number] Epilepsy Attending: Charlsie Quest Referring Physician/Provider: Erick Blinks, MD Duration: 04/01/2023 1403 to 04/02/2023 1403 Patient history: 82 yo F brought in with forced L gaze, L arm and L leg rhythmic twitching. EEG to evaluate for seizure Level of alertness: Awake, asleep AEDs during EEG study: LEV Technical aspects: This EEG study was done with scalp electrodes positioned  according to the 10-20 International system of electrode placement. Electrical activity was reviewed with band pass filter of 1-70Hz , sensitivity of 7 uV/mm, display speed of 28mm/sec with Vonzell Lindblad 60Hz  notched filter applied as appropriate. EEG data were recorded continuously and digitally stored.  Video monitoring was available and reviewed as appropriate. Description: At the beginning of the study, EEG showed sharply contoured 5 to 6 Hz theta slowing in right hemisphere, maximal right temporoparietal region admixed with sharp waves.  Gradually EEG evolved into 2 to 3 Hz delta slowing.  No clinical signs were noted on video.  This EEG pattern was consistent with focal electrographic status epilepticus.  As antiseizure medications were adjusted, status epilepticus resolved on 04/01/2023 at 1411. Subsequently EEG showed continuous generalized 3-7hz  theta-delta slowing.  Gradually EEG improved and showed posterior dominant rhythm of 6 Hz activity of moderate voltage (25-35 uV) seen predominantly in posterior head regions, symmetric and reactive to eye opening and eye closing. Sleep was characterized by vertex waves, sleep spindles (12 to 14 Hz), maximal frontocentral region. Hyperventilation and photic stimulation were not  performed.   ABNORMALITY -Electrographic status epilepticus, right temporoparietal region - Continuous slow, generalized IMPRESSION: At the beginning of the study, EEG was consistent with electrographic status epilepticus arising from right temporoparietal region.  As anti-seizure medications were adjusted, status epilepticus resolved on 04/01/2023 at 1411.  Subsequently EEG was suggestive of moderate diffuse encephalopathy. Charlsie Quest   CT VENOGRAM HEAD  Result Date: 04/01/2023 CLINICAL DATA:  Code stroke.  Subcortical high density by head CT. EXAM: CT VENOGRAM HEAD TECHNIQUE: Venographic phase images of the brain were obtained following the administration of intravenous contrast. Multiplanar reformats and maximum intensity projections were generated. RADIATION DOSE REDUCTION: This exam was performed according to the departmental dose-optimization program which includes automated exposure control, adjustment of the mA and/or kV according to patient size and/or use of iterative reconstruction technique. CONTRAST:  75mL OMNIPAQUE IOHEXOL 350 MG/ML SOLN COMPARISON:  Preceding head CT. FINDINGS: The dural venous sinuses are diffusely patent no stricture or diverticulum seen. No evidence of central or cortical venous thrombosis. The subcortical high density in the right parietal region is again noted without convincing adjacent enhancement. No abnormal parenchymal enhancement seen throughout the brain. IMPRESSION: No dural venous sinus thrombosis or other acute venous finding. Electronically Signed   By: Tiburcio Pea M.D.   On: 04/01/2023 13:10   CT ANGIO HEAD NECK W WO CM (CODE STROKE)  Result Date: 04/01/2023 CLINICAL DATA:  Assess intracranial arteries. EXAM: CT ANGIOGRAPHY HEAD AND NECK WITH AND WITHOUT CONTRAST TECHNIQUE: Multidetector CT imaging of the head and neck was performed using the standard protocol during bolus administration of intravenous contrast. Multiplanar CT image reconstructions  and MIPs were obtained to evaluate the vascular anatomy. Carotid stenosis measurements (when applicable) are obtained utilizing NASCET criteria, using the distal internal carotid diameter as the denominator. RADIATION DOSE REDUCTION: This exam was performed according to the departmental dose-optimization program which includes automated exposure control, adjustment of the mA and/or kV according to patient size and/or use of iterative reconstruction technique. CONTRAST:  75 cc Omnipaque 350 intravenous COMPARISON:  Head CT from earlier today. CT of the head neck 04/27/2022 FINDINGS: CTA NECK FINDINGS Aortic arch: Atheromatous calcification with 3 vessel branching. Right carotid system: Atheromatous plaque at the proximal ICA is mild. No flow reducing stenosis Left carotid system: Vessels are smoothly contoured and widely patent with less than typical atheromatous change Vertebral arteries: No proximal subclavian or vertebral stenosis. No  beading or dissection. Skeleton: Negative. Other neck: Negative. Upper chest: Negative Review of the MIP images confirms the above findings CTA HEAD FINDINGS Anterior circulation: No significant stenosis, proximal occlusion, aneurysm, or vascular malformation. Atheromatous calcification of the carotid siphons. Posterior circulation: The vertebral and basilar arteries are smoothly contoured and diffusely patent. Atheromatous irregularity of the posterior cerebral arteries asymmetric to the left but mild. No proximal flow reducing stenosis. Venous sinuses: Unremarkable Anatomic variants: Unremarkable Review of the MIP images confirms the above findings IMPRESSION: 1. No emergent finding including large vessel occlusion. 2. Mild for age atherosclerosis. No flow reducing stenosis or irregularity of major arteries in the head and neck. Electronically Signed   By: Tiburcio Pea M.D.   On: 04/01/2023 13:07   CT HEAD CODE STROKE WO CONTRAST  Result Date: 04/01/2023 CLINICAL DATA:   Code stroke.  Unresponsive EXAM: CT HEAD WITHOUT CONTRAST TECHNIQUE: Contiguous axial images were obtained from the base of the skull through the vertex without intravenous contrast. RADIATION DOSE REDUCTION: This exam was performed according to the departmental dose-optimization program which includes automated exposure control, adjustment of the mA and/or kV according to patient size and/or use of iterative reconstruction technique. COMPARISON:  04/27/2022 brain MRI FINDINGS: Brain: No evidence of acute infarction, hemorrhage, hydrocephalus, extra-axial collection or mass lesion/mass effect. Chronic small vessel ischemic gliosis in the cerebral white matter that is confluent. Small chronic left cerebellar infarct also seen on prior brain MRI. There is cerebral volume loss with ventriculomegaly. High-density in the subcortical right parietal lobe measuring 6 mm, stable based on prior reformats and with T1 hyperintensity based on prior brain MRI, possible cavernoma. Vascular: No hyperdense vessel or unexpected calcification. Skull: Normal. Negative for fracture or focal lesion. Sinuses/Orbits: No acute finding. Other: Prelim sent in epic chat. ASPECTS [facility] Stroke Program Early CT Score) Not scored with this history IMPRESSION: 1. No acute finding. 2. Extensive chronic small vessel disease. 3. Chronic mineralization along the right parietal cortex, possible cavernoma. Electronically Signed   By: Tiburcio Pea M.D.   On: 04/01/2023 12:50        Scheduled Meds:  apixaban  2.5 mg Oral BID   carvedilol  12.5 mg Oral BID WC   sodium chloride flush  3 mL Intravenous Q12H   Continuous Infusions:  cefTRIAXone (ROCEPHIN)  IV Stopped (04/02/23 1247)   dextrose 5% lactated ringers     levETIRAcetam 750 mg (04/03/23 0921)     LOS: 1 day    Time spent: over 30 min    Lacretia Nicks, MD [facility]   To contact the attending provider between 7A-7P or the covering provider during after  hours 7P-7A, please log into the web site [email or web address] and access using universal [place] password for that web site. If you do not have the password, please call the hospital operator.  04/03/2023, 10:50 AM

## 2023-04-03 NOTE — Progress Notes (Signed)
SLP Cancellation Note  Patient Details Name: PRICSILLA RUDE MRN: [id number] DOB: 1940/08/08   Cancelled treatment:        Attempted to see pt for swallowing assessment.  Spoke briefly with RN. Deferred evaluation 2/2 somnolence.  SLP will reattempt as schedule permits.    Kerrie Pleasure, MA, CCC-SLP Acute Rehabilitation Services Office: [phone] 04/03/2023, 1:29 PM
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
## 2023-04-03 NOTE — Progress Notes (Signed)
NEUROLOGY CONSULT FOLLOW UP NOTE   Date of service: April 03, 2023 Patient Name: Brandy Gonzales MRN:  [id number] DOB:  August 15, 1940  Brief HPI  Brandy Gonzales is a 82 y.o. 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 Resolved clinically with Ativan 2mg  and Keppra 2000mg . When she was put on LTM EEG, was in electrographic status epilepticus arising from right temporoparietal region.  As anti-seizure medications were adjusted, status epilepticus resolved on 04/01/2023 at 1411.   Interval Hx/subjective   She is improving. Still favors R side over left but moving all extremities. LTM towards the end yesterday, showed runs of polyspikes on the right  Vitals   Vitals:   04/03/23 0600 04/03/23 0728 04/03/23 0800 04/03/23 1124  BP:  (!) 181/90 (!) 179/81 (!) 184/80  Pulse:  77  76  Resp: 14 15  16   Temp:  98 F (36.7 C)  98.2 F (36.8 C)  TempSrc:  Axillary  Oral  SpO2:  100%  100%  Weight:      Height:         Body mass index is 21.55 kg/m.  Physical Exam   General: Laying comfortably in bed; in no acute distress. HENT: Normal oropharynx and mucosa. Normal external appearance of ears and nose.  Neck: Supple, no pain or tenderness  CV: No JVD. No peripheral edema.  Pulmonary: Symmetric Chest rise. Normal respiratory effort.  Abdomen: Soft to touch, non-tender.  Ext: No cyanosis, edema, or deformity  Skin: No rash. Normal palpation of skin.   Musculoskeletal: Normal digits and nails by inspection. No clubbing.   Neurologic Examination  Mental status/Cognition: Alert, oriented to self. Speech/language: Fluent, comprehension intact, gets upset at me and asks to be left along. Cranial nerves:   CN II Pupils equal and reactive to light, no VF deficits    CN III,IV,VI EOM intact, no gaze preference or deviation, no nystagmus    CN  V normal sensation in V1, V2, and V3 segments bilaterally    CN VII no asymmetry, no nasolabial fold flattening    CN VIII normal hearing to speech    CN IX & X normal palatal elevation, no uvular deviation    CN XI 5/5 head turn and 5/5 shoulder shrug bilaterally    CN XII midline tongue protrusion    Motor:  Muscle bulk: poor, tone normal  Moves BL upper extremities spontaneously and antigravity. Still favors right side over left side. Withdraws BL lower extremities to babinskis.  Labs and Diagnostic Imaging   CBC:  Recent Labs  Lab 04/01/23 1232 04/01/23 1241 04/02/23 0520 04/03/23 0425  WBC 7.1  --  9.1 7.8  NEUTROABS 4.5  --   --  5.9  HGB 12.0   < > 13.1 12.3  HCT 36.5   < > 41.0 37.0  MCV 94.8  --  96.2 92.0  PLT 156  --  142* 187   < > = values in this interval not displayed.    Basic Metabolic Panel:  Lab Results  Component Value Date   NA 143 04/03/2023   K 3.8 04/03/2023   CO2 18 (L) 04/03/2023   GLUCOSE 122 (H) 04/03/2023   BUN 21 04/03/2023   CREATININE 1.20 (H) 04/03/2023   CALCIUM 10.0 04/03/2023   GFRNONAA 45 (L) 04/03/2023   GFRAA 33 (L) 05/03/2019   Lipid Panel:  Lab  Results  Component Value Date   LDLCALC 72 10/21/2021   HgbA1c:  Lab Results  Component Value Date   HGBA1C 6.0 (H) 01/30/2023   Urine Drug Screen:     Component Value Date/Time   LABOPIA NONE DETECTED 04/02/2023 0146   COCAINSCRNUR NONE DETECTED 04/02/2023 0146   LABBENZ POSITIVE (A) 04/02/2023 0146   AMPHETMU NONE DETECTED 04/02/2023 0146   THCU NONE DETECTED 04/02/2023 0146   LABBARB NONE DETECTED 04/02/2023 0146    Alcohol Level     Component Value Date/Time   ETH <10 04/01/2023 1232   INR  Lab Results  Component Value Date   INR 1.1 04/01/2023   APTT  Lab Results  Component Value Date   APTT 30 04/01/2023   AED levels: No results found for: "PHENYTOIN", "ZONISAMIDE", "LAMOTRIGINE", "LEVETIRACETA"  CT Head without contrast(Personally reviewed): CTH was  negative for a large hypodensity concerning for a large territory infarct or hyperdensity concerning for an ICH   MRI Brain: pending  cEEG yesterday, will rehook today which is pending:  At the beginning of the study, EEG was consistent with electrographic status epilepticus arising from right temporoparietal region.  As anti-seizure medications were adjusted, status epilepticus resolved on 04/01/2023 at 1411.  Subsequently EEG was suggestive of moderate diffuse encephalopathy.   Impression   EBONEE OLEARY is a 82 y.o. female presenting with focal R hemispheric status epilepticus. Perhaps could have been triggered by R parietal cavernoma/mineralization. No fever, labs not consistent with sepsis. No other clear seizure risk factors.  LTM discontinued yesterday afternoon but upon review, showed runs of polyspikes on the right yesterday. Will get MRI Brain and rehook her up to LTM. Keppra was bumped up overnight to 750 BID.  Recommendations  - Keppra 750 BID - MRI Brain w/o contrast. - LTM after MRI brain. ______________________________________________________________________  Plan discussed with Dr. Lowell Gonzales and with patient's daughter over phone.  Signed, Brandy Blinks, MD [facility]

## 2023-04-03 NOTE — Progress Notes (Signed)
Nurse requested that EEG techs wait to do cEEG until Pt is back from MRI

## 2023-04-03 NOTE — Progress Notes (Signed)
LTM EEG hooked up and running - no initial skin breakdown - push button tested - [facility] monitoring.  

## 2023-04-03 NOTE — Progress Notes (Signed)
Ativan 0.5 mg PO given for MRI.

## 2023-04-04 DIAGNOSIS — R569 Unspecified convulsions: Secondary | ICD-10-CM | POA: Diagnosis not present

## 2023-04-04 LAB — BASIC METABOLIC PANEL
Anion gap: 9 (ref 5–15)
BUN: 21 mg/dL (ref 8–23)
CO2: 21 mmol/L — ABNORMAL LOW (ref 22–32)
Calcium: 9.7 mg/dL (ref 8.9–10.3)
Chloride: 113 mmol/L — ABNORMAL HIGH (ref 98–111)
Creatinine, Ser: 1.39 mg/dL — ABNORMAL HIGH (ref 0.44–1.00)
GFR, Estimated: 38 mL/min — ABNORMAL LOW (ref >60)
Glucose, Bld: 190 mg/dL — ABNORMAL HIGH (ref 70–99)
Potassium: 4.2 mmol/L (ref 3.5–5.1)
Sodium: 143 mmol/L (ref 135–145)

## 2023-04-04 LAB — GLUCOSE, CAPILLARY
Glucose-Capillary: 209 mg/dL — ABNORMAL HIGH (ref 70–99)
Glucose-Capillary: 175 mg/dL — ABNORMAL HIGH (ref 70–99)
Glucose-Capillary: 206 mg/dL — ABNORMAL HIGH (ref 70–99)

## 2023-04-04 LAB — URINE CULTURE: Culture: >=100000 — AB

## 2023-04-04 MED ORDER — LEVETIRACETAM 750 MG PO TABS: 750.0000 mg | ORAL_TABLET | Freq: Two times a day (BID) | ORAL | 1 refills | Status: DC

## 2023-04-04 MED ORDER — CEFADROXIL 500 MG PO CAPS
500.0000 mg | ORAL_CAPSULE | Freq: Every day | ORAL | Status: DC
  Administered 2023-04-04: 500 mg via ORAL
  Filled 2023-04-04: qty 1

## 2023-04-04 MED ORDER — INSULIN ASPART 100 UNIT/ML IJ SOLN: 0.0000 [IU] | Freq: Every day | INTRAMUSCULAR | Status: DC

## 2023-04-04 MED ORDER — CEFADROXIL 500 MG PO CAPS: 500.0000 mg | ORAL_CAPSULE | Freq: Every day | ORAL | 0 refills | Status: AC

## 2023-04-04 MED ORDER — INSULIN ASPART 100 UNIT/ML IJ SOLN
0.0000 [IU] | Freq: Three times a day (TID) | INTRAMUSCULAR | Status: DC
  Administered 2023-04-04: 3 [IU] via SUBCUTANEOUS

## 2023-04-04 NOTE — Plan of Care (Signed)
Problem: Education: Goal: Ability to describe self-care measures that may prevent or decrease complications (Diabetes Survival Skills Education) will improve 04/04/2023 1535 by Beryle Flock, RN Outcome: Adequate for Discharge 04/04/2023 1534 by Beryle Flock, RN Outcome: Progressing Goal: Individualized Educational Video(s) 04/04/2023 1535 by Beryle Flock, RN Outcome: Adequate for Discharge 04/04/2023 1534 by Beryle Flock, RN Outcome: Progressing   Problem: Coping: Goal: Ability to adjust to condition or change in health will improve 04/04/2023 1535 by Beryle Flock, RN Outcome: Adequate for Discharge 04/04/2023 1534 by Beryle Flock, RN Outcome: Progressing   Problem: Fluid Volume: Goal: Ability to maintain a balanced intake and output will improve 04/04/2023 1535 by Beryle Flock, RN Outcome: Adequate for Discharge 04/04/2023 1534 by Beryle Flock, RN Outcome: Progressing   Problem: Health Behavior/Discharge Planning: Goal: Ability to identify and utilize available resources and services will improve 04/04/2023 1535 by Beryle Flock, RN Outcome: Adequate for Discharge 04/04/2023 1534 by Beryle Flock, RN Outcome: Progressing Goal: Ability to manage health-related needs will improve 04/04/2023 1535 by Beryle Flock, RN Outcome: Adequate for Discharge 04/04/2023 1534 by Beryle Flock, RN Outcome: Progressing   Problem: Metabolic: Goal: Ability to maintain appropriate glucose levels will improve 04/04/2023 1535 by Beryle Flock, RN Outcome: Adequate for Discharge 04/04/2023 1534 by Beryle Flock, RN Outcome: Progressing   Problem: Nutritional: Goal: Maintenance of adequate nutrition will improve 04/04/2023 1535 by Beryle Flock, RN Outcome: Adequate for Discharge 04/04/2023 1534 by Beryle Flock, RN Outcome: Progressing Goal: Progress toward achieving an optimal weight will improve 04/04/2023 1535 by Beryle Flock, RN Outcome: Adequate for  Discharge 04/04/2023 1534 by Beryle Flock, RN Outcome: Progressing   Problem: Skin Integrity: Goal: Risk for impaired skin integrity will decrease 04/04/2023 1535 by Beryle Flock, RN Outcome: Adequate for Discharge 04/04/2023 1534 by Beryle Flock, RN Outcome: Progressing   Problem: Tissue Perfusion: Goal: Adequacy of tissue perfusion will improve 04/04/2023 1535 by Beryle Flock, RN Outcome: Adequate for Discharge 04/04/2023 1534 by Beryle Flock, RN Outcome: Progressing   Problem: Education: Goal: Knowledge of General Education information will improve Description: Including pain rating scale, medication(s)/side effects and non-pharmacologic comfort measures 04/04/2023 1535 by Beryle Flock, RN Outcome: Adequate for Discharge 04/04/2023 1534 by Beryle Flock, RN Outcome: Progressing   Problem: Health Behavior/Discharge Planning: Goal: Ability to manage health-related needs will improve 04/04/2023 1535 by Beryle Flock, RN Outcome: Adequate for Discharge 04/04/2023 1534 by Beryle Flock, RN Outcome: Progressing   Problem: Clinical Measurements: Goal: Ability to maintain clinical measurements within normal limits will improve 04/04/2023 1535 by Beryle Flock, RN Outcome: Adequate for Discharge 04/04/2023 1534 by Beryle Flock, RN Outcome: Progressing Goal: Will remain free from infection 04/04/2023 1535 by Beryle Flock, RN Outcome: Adequate for Discharge 04/04/2023 1534 by Beryle Flock, RN Outcome: Progressing Goal: Diagnostic test results will improve 04/04/2023 1535 by Beryle Flock, RN Outcome: Adequate for Discharge 04/04/2023 1534 by Beryle Flock, RN Outcome: Progressing Goal: Respiratory complications will improve 04/04/2023 1535 by Beryle Flock, RN Outcome: Adequate for Discharge 04/04/2023 1534 by Beryle Flock, RN Outcome: Progressing Goal: Cardiovascular complication will be avoided 04/04/2023 1535 by Beryle Flock, RN Outcome: Adequate for  Discharge 04/04/2023 1534 by Beryle Flock, RN Outcome: Progressing   Problem: Activity: Goal: Risk for activity intolerance will decrease 04/04/2023 1535 by Beryle Flock, RN Outcome: Adequate for Discharge 04/04/2023 1534 by Beryle Flock, RN Outcome: Progressing   Problem: Nutrition: Goal: Adequate nutrition will be maintained 04/04/2023 1535 by Beryle Flock, RN Outcome: Adequate for Discharge 04/04/2023 1534 by Beryle Flock, RN Outcome: Progressing  Problem: Coping: Goal: Level of anxiety will decrease 04/04/2023 1535 by Beryle Flock, RN Outcome: Adequate for Discharge 04/04/2023 1534 by Beryle Flock, RN Outcome: Progressing   Problem: Elimination: Goal: Will not experience complications related to bowel motility 04/04/2023 1535 by Beryle Flock, RN Outcome: Adequate for Discharge 04/04/2023 1534 by Beryle Flock, RN Outcome: Progressing Goal: Will not experience complications related to urinary retention 04/04/2023 1535 by Beryle Flock, RN Outcome: Adequate for Discharge 04/04/2023 1534 by Beryle Flock, RN Outcome: Progressing   Problem: Pain Management: Goal: General experience of comfort will improve 04/04/2023 1535 by Beryle Flock, RN Outcome: Adequate for Discharge 04/04/2023 1534 by Beryle Flock, RN Outcome: Progressing   Problem: Safety: Goal: Ability to remain free from injury will improve 04/04/2023 1535 by Beryle Flock, RN Outcome: Adequate for Discharge 04/04/2023 1534 by Beryle Flock, RN Outcome: Progressing   Problem: Skin Integrity: Goal: Risk for impaired skin integrity will decrease 04/04/2023 1535 by Beryle Flock, RN Outcome: Adequate for Discharge 04/04/2023 1534 by Beryle Flock, RN Outcome: Progressing   Problem: Education: Goal: Expressions of having a comfortable level of knowledge regarding the disease process will increase 04/04/2023 1535 by Beryle Flock, RN Outcome: Adequate for Discharge 04/04/2023 1534 by  Beryle Flock, RN Outcome: Progressing   Problem: Coping: Goal: Ability to adjust to condition or change in health will improve 04/04/2023 1535 by Beryle Flock, RN Outcome: Adequate for Discharge 04/04/2023 1534 by Beryle Flock, RN Outcome: Progressing Goal: Ability to identify appropriate support needs will improve 04/04/2023 1535 by Beryle Flock, RN Outcome: Adequate for Discharge 04/04/2023 1534 by Beryle Flock, RN Outcome: Progressing   Problem: Health Behavior/Discharge Planning: Goal: Compliance with prescribed medication regimen will improve 04/04/2023 1535 by Beryle Flock, RN Outcome: Adequate for Discharge 04/04/2023 1534 by Beryle Flock, RN Outcome: Progressing   Problem: Medication: Goal: Risk for medication side effects will decrease 04/04/2023 1535 by Beryle Flock, RN Outcome: Adequate for Discharge 04/04/2023 1534 by Beryle Flock, RN Outcome: Progressing   Problem: Clinical Measurements: Goal: Complications related to the disease process, condition or treatment will be avoided or minimized 04/04/2023 1535 by Beryle Flock, RN Outcome: Adequate for Discharge 04/04/2023 1534 by Beryle Flock, RN Outcome: Progressing Goal: Diagnostic test results will improve 04/04/2023 1535 by Beryle Flock, RN Outcome: Adequate for Discharge 04/04/2023 1534 by Beryle Flock, RN Outcome: Progressing   Problem: Safety: Goal: Verbalization of understanding the information provided will improve 04/04/2023 1535 by Beryle Flock, RN Outcome: Adequate for Discharge 04/04/2023 1534 by Beryle Flock, RN Outcome: Progressing   Problem: Self-Concept: Goal: Level of anxiety will decrease 04/04/2023 1535 by Beryle Flock, RN Outcome: Adequate for Discharge 04/04/2023 1534 by Beryle Flock, RN Outcome: Progressing Goal: Ability to verbalize feelings about condition will improve 04/04/2023 1535 by Beryle Flock, RN Outcome: Adequate for Discharge 04/04/2023 1534 by  Beryle Flock, RN Outcome: Progressing

## 2023-04-04 NOTE — Progress Notes (Signed)
NEUROLOGY CONSULT FOLLOW UP NOTE   Date of service: April 04, 2023 Patient Name: Brandy Gonzales MRN:  [id number] DOB:  1940/08/17  Brief HPI  Brandy Gonzales is a 82 y.o. 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 Resolved clinically with Ativan 2mg  and Keppra 2000mg . When she was put on LTM EEG, was in electrographic status epilepticus arising from right temporoparietal region.  As anti-seizure medications were adjusted, status epilepticus resolved on 04/01/2023 at 1411.  MRI Brain with no acute abnormalities. 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   Interval Hx/subjective   Continues to improve. No focal deficit noted today on exam.  Vitals   Vitals:   04/03/23 1610 04/03/23 1947 04/04/23 0350 04/04/23 0755  BP: (!) 149/64 (!) 167/63 (!) 173/72 (!) 174/67  Pulse: (!) 52 61 60 (!) 53  Resp: 16   17  Temp: 98.1 F (36.7 C) 97.6 F (36.4 C) 97.9 F (36.6 C) 97.9 F (36.6 C)  TempSrc: Oral Oral Oral Axillary  SpO2: 100% 100% 100% 100%  Weight:      Height:         Body mass index is 21.55 kg/m.  Physical Exam   General: Laying comfortably in bed; in no acute distress. HENT: Normal oropharynx and mucosa. Normal external appearance of ears and nose.  Neck: Supple, no pain or tenderness  CV: No JVD. No peripheral edema.  Pulmonary: Symmetric Chest rise. Normal respiratory effort.  Abdomen: Soft to touch, non-tender.  Ext: No cyanosis, edema, or deformity  Skin: No rash. Normal palpation of skin.   Musculoskeletal: Normal digits and nails by inspection. No clubbing.   Neurologic Examination  Mental status/Cognition: Alert, oriented to self. Speech/language: Fluent, comprehension intact, gets upset at me and asks to be left along. Cranial nerves:   CN II Pupils  equal and reactive to light, no VF deficits    CN III,IV,VI EOM intact, no gaze preference or deviation, no nystagmus    CN V normal sensation in V1, V2, and V3 segments bilaterally    CN VII no asymmetry, no nasolabial fold flattening    CN VIII normal hearing to speech    CN IX & X normal palatal elevation, no uvular deviation    CN XI 5/5 head turn and 5/5 shoulder shrug bilaterally    CN XII midline tongue protrusion    Motor:  Muscle bulk: poor, tone normal  Moves BL upper extremities spontaneously and antigravity. Lifts BL lower extremities off the bed on command.  Labs and Diagnostic Imaging   CBC:  Recent Labs  Lab 04/01/23 1232 04/01/23 1241 04/02/23 0520 04/03/23 0425  WBC 7.1  --  9.1 7.8  NEUTROABS 4.5  --   --  5.9  HGB 12.0   < > 13.1 12.3  HCT 36.5   < > 41.0 37.0  MCV 94.8  --  96.2 92.0  PLT 156  --  142* 187   < > = values in this interval not displayed.    Basic Metabolic Panel:  Lab Results  Component Value Date   NA 143 04/04/2023   K 4.2 04/04/2023   CO2 21 (L) 04/04/2023   GLUCOSE 190 (H) 04/04/2023   BUN 21 04/04/2023   CREATININE 1.39 (H) 04/04/2023   CALCIUM 9.7 04/04/2023   GFRNONAA 38 (L) 04/04/2023   GFRAA 33 (L)  05/03/2019   Lipid Panel:  Lab Results  Component Value Date   LDLCALC 72 10/21/2021   HgbA1c:  Lab Results  Component Value Date   HGBA1C 6.0 (H) 01/30/2023   Urine Drug Screen:     Component Value Date/Time   LABOPIA NONE DETECTED 04/02/2023 0146   COCAINSCRNUR NONE DETECTED 04/02/2023 0146   LABBENZ POSITIVE (A) 04/02/2023 0146   AMPHETMU NONE DETECTED 04/02/2023 0146   THCU NONE DETECTED 04/02/2023 0146   LABBARB NONE DETECTED 04/02/2023 0146    Alcohol Level     Component Value Date/Time   ETH <10 04/01/2023 1232   INR  Lab Results  Component Value Date   INR 1.1 04/01/2023   APTT  Lab Results  Component Value Date   APTT 30 04/01/2023   AED levels: No results found for: "PHENYTOIN",  "ZONISAMIDE", "LAMOTRIGINE", "LEVETIRACETA"  CT Head without contrast(Personally reviewed): CTH was negative for a large hypodensity concerning for a large territory infarct or hyperdensity concerning for an ICH   MRI Brain: MRI Brain with prior ICHs in BL parieto-occipital regions.  cEEG yesterday, will rehook today which is pending:  At the beginning of the study, EEG was consistent with electrographic status epilepticus arising from right temporoparietal region.  As anti-seizure medications were adjusted, status epilepticus resolved on 04/01/2023 at 1411.  Subsequently EEG was suggestive of moderate diffuse encephalopathy.   Impression   Brandy Gonzales is a 82 y.o. female presenting with focal R hemispheric status epilepticus. Perhaps could have been triggered by prior ICHs in BL parieto-occipital regions.. No fever, labs not consistent with sepsis. No other clear seizure risk factors.  MRI Brain with prior ICHs in BL parieto-occipital regions. Repeat LTM with epileptiform discharges but clinically she continues to get better and is very close to her baseline.  Recommendations  - Keppra 750 BID - follow up with neurology outpatient - we will signoff. Please feel free to contact us with any questions or concerns. - I spoke with her daughter and patient does not drive. ______________________________________________________________________  Plan discussed with Dr. Lowell Guitar and with patient's daughter over phone.  Signed, Erick Blinks, MD [facility]

## 2023-04-04 NOTE — Procedures (Addendum)
Patient Name: Brandy Gonzales  MRN: [id number]  Epilepsy Attending: Charlsie Quest  Referring Physician/Provider: Rejeana Brock, MD  Duration: 04/03/2023 1343 to 04/04/2023 3474   Patient history: 82 yo F brought in with forced L gaze, L arm and L leg rhythmic twitching. EEG to evaluate for seizure   Level of alertness: Awake, asleep   AEDs during EEG study: LEV   Technical aspects: This EEG study was done with scalp electrodes positioned according to the 10-20 International system of electrode placement. Electrical activity was reviewed with band pass filter of 1-70Hz , sensitivity of 7 uV/mm, display speed of 18mm/sec with a 60Hz  notched filter applied as appropriate. EEG data were recorded continuously and digitally stored.  Video monitoring was available and reviewed as appropriate.   Description:  The posterior dominant rhythm consists of 7 Hz activity of moderate voltage (25-35 uV) seen predominantly in posterior head regions, symmetric and reactive to eye opening and eye closing. Sleep was characterized by vertex waves, sleep spindles (12 to 14 Hz), maximal frontocentral region. EEG showed continuous generalized 3 to 6 Hz theta-delta slowing. Lateralized periodic discharges were noted arising from right temporoparietal region with fluctuating frequency of 0.5-4Hz , at times with overriding fast activity. Hyperventilation and photic stimulation were not performed.      ABNORMALITY - Lateralized periodic discharges, right temporoparietal region - Continuous slow, generalized   IMPRESSION: This study showed evidence of epileptogenicity arising from right temporoparietal region. This EEG pattern is on the ictal-interictal region with increased risk of seizure recurrence. Additionally there is moderate diffuse encephalopathy. No definite seizures were seen throughout the recording.   Brandy Gonzales

## 2023-04-04 NOTE — Plan of Care (Signed)
  Problem: Fluid Volume: Goal: Ability to maintain a balanced intake and output will improve Outcome: Progressing   Problem: Elimination: Goal: Will not experience complications related to bowel motility Outcome: Progressing Goal: Will not experience complications related to urinary retention Outcome: Progressing   Problem: Pain Management: Goal: General experience of comfort will improve Outcome: Progressing   Problem: Safety: Goal: Ability to remain free from injury will improve Outcome: Progressing   Problem: Clinical Measurements: Goal: Complications related to the disease process, condition or treatment will be avoided or minimized Outcome: Progressing Goal: Diagnostic test results will improve Outcome: Progressing

## 2023-04-04 NOTE — Progress Notes (Signed)
LTM EEG discontinued - no skin breakdown at unhook.   

## 2023-04-04 NOTE — Progress Notes (Signed)
Dc instructions reviewed with daughter [name]. All belongings and dc paperwork given to famly.

## 2023-04-04 NOTE — Care Management (Signed)
Patient is active w [facility]. They ave been notified of admission.  Please place HH orders prior to DC

## 2023-04-04 NOTE — Plan of Care (Signed)
  Problem: Education: Goal: Ability to describe self-care measures that may prevent or decrease complications (Diabetes Survival Skills Education) will improve Outcome: Progressing Goal: Individualized Educational Video(s) Outcome: Progressing   Problem: Coping: Goal: Ability to adjust to condition or change in health will improve Outcome: Progressing   Problem: Fluid Volume: Goal: Ability to maintain a balanced intake and output will improve Outcome: Progressing   Problem: Health Behavior/Discharge Planning: Goal: Ability to identify and utilize available resources and services will improve Outcome: Progressing Goal: Ability to manage health-related needs will improve Outcome: Progressing   Problem: Metabolic: Goal: Ability to maintain appropriate glucose levels will improve Outcome: Progressing   Problem: Nutritional: Goal: Maintenance of adequate nutrition will improve Outcome: Progressing Goal: Progress toward achieving an optimal weight will improve Outcome: Progressing   Problem: Skin Integrity: Goal: Risk for impaired skin integrity will decrease Outcome: Progressing   Problem: Tissue Perfusion: Goal: Adequacy of tissue perfusion will improve Outcome: Progressing   Problem: Education: Goal: Knowledge of General Education information will improve Description: Including pain rating scale, medication(s)/side effects and non-pharmacologic comfort measures Outcome: Progressing   Problem: Health Behavior/Discharge Planning: Goal: Ability to manage health-related needs will improve Outcome: Progressing   Problem: Clinical Measurements: Goal: Ability to maintain clinical measurements within normal limits will improve Outcome: Progressing Goal: Will remain free from infection Outcome: Progressing Goal: Diagnostic test results will improve Outcome: Progressing Goal: Respiratory complications will improve Outcome: Progressing Goal: Cardiovascular complication will  be avoided Outcome: Progressing   Problem: Activity: Goal: Risk for activity intolerance will decrease Outcome: Progressing   Problem: Nutrition: Goal: Adequate nutrition will be maintained Outcome: Progressing   Problem: Coping: Goal: Level of anxiety will decrease Outcome: Progressing   Problem: Elimination: Goal: Will not experience complications related to bowel motility Outcome: Progressing Goal: Will not experience complications related to urinary retention Outcome: Progressing   Problem: Pain Management: Goal: General experience of comfort will improve Outcome: Progressing   Problem: Safety: Goal: Ability to remain free from injury will improve Outcome: Progressing   Problem: Skin Integrity: Goal: Risk for impaired skin integrity will decrease Outcome: Progressing   Problem: Education: Goal: Expressions of having a comfortable level of knowledge regarding the disease process will increase Outcome: Progressing   Problem: Coping: Goal: Ability to adjust to condition or change in health will improve Outcome: Progressing Goal: Ability to identify appropriate support needs will improve Outcome: Progressing   Problem: Health Behavior/Discharge Planning: Goal: Compliance with prescribed medication regimen will improve Outcome: Progressing   Problem: Medication: Goal: Risk for medication side effects will decrease Outcome: Progressing   Problem: Clinical Measurements: Goal: Complications related to the disease process, condition or treatment will be avoided or minimized Outcome: Progressing Goal: Diagnostic test results will improve Outcome: Progressing   Problem: Safety: Goal: Verbalization of understanding the information provided will improve Outcome: Progressing   Problem: Self-Concept: Goal: Level of anxiety will decrease Outcome: Progressing Goal: Ability to verbalize feelings about condition will improve Outcome: Progressing

## 2023-04-04 NOTE — Progress Notes (Signed)
Bladder scan performed, per scan.  No intervention at this time per order, will continue to monitor.

## 2023-04-04 NOTE — Evaluation (Signed)
Physical Therapy Evaluation Patient Details Name: Brandy Gonzales MRN: [id number] DOB: 31-Oct-1940 Today's Date: 04/04/2023  History of Present Illness  82 y.o. female who presented 04/01/23 with rhythmic L arm twitching, L gaze deviation. EEG results 11/22: "This study showed evidence of epileptogenicity arising from right temporoparietal region.Marland KitchenMarland KitchenAdditionally there is moderate diffuse encephalopathy." MRI 11/23 without acute findings.  PMHx: HTN, DVT,  IVC filter (2006), T2DM,CKD3b, dementia, glaucoma  Clinical Impression   Pt admitted secondary to problem above with deficits below. PTA patient was living in one level home with ramp to enter (uses wheelchair for community outings). She walks with a standard walker with 1-2 person assist (varies) very short distances and not every day.  Pt currently requires mod assist for bed mobility and max assist to ambulate 6 ft with RW. Daughter present and reports "some days it takes 2 of [place] to get her to walk." Feels family can take care of her at home. Discussed car transfer and she feels she can transport her by vehicle and into home via wheelchair. Has been receiving HHPT and daughter would like this to continue and requesting HHOT as well.  Anticipate patient will benefit from PT to address problems listed below.Will continue to follow acutely to maximize functional mobility independence and safety.           If plan is discharge home, recommend the following: Two people to help with walking and/or transfers;A lot of help with bathing/dressing/bathroom;Assistance with cooking/housework;Direct supervision/assist for medications management;Direct supervision/assist for financial management;Assist for transportation;Help with stairs or ramp for entrance;Supervision due to cognitive status   Can travel by private vehicle        Equipment Recommendations None recommended by PT  Recommendations for Other Services       Functional Status Assessment Patient  has had a recent decline in their functional status and demonstrates the ability to make significant improvements in function in a reasonable and predictable amount of time.     Precautions / Restrictions Precautions Precautions: Fall Restrictions Weight Bearing Restrictions: No      Mobility  Bed Mobility Overal bed mobility: Needs Assistance Bed Mobility: Supine to Sit, Sit to Supine     Supine to sit: Mod assist, HOB elevated Sit to supine: Max assist   General bed mobility comments: pt required min assist to move legs over EOB and to raise torso; mod assist to scoot to get feet on the floor; pt sat on very EOB on return, therefore max assist for legs to keep her from sliding off edge    Transfers Overall transfer level: Needs assistance Equipment used: Rolling walker (2 wheels) Transfers: Sit to/from Stand Sit to Stand: Mod assist           General transfer comment: vc for hand placement with pt doing the opposite of what she was told; large boost to come to stand with pt almost immediately trying to sit back down (daughter reports she does this at home)    Ambulation/Gait Ambulation/Gait assistance: Mod assist, Max assist Gait Distance (Feet): 6 Feet Assistive device: Rolling walker (2 wheels) Gait Pattern/deviations: Step-through pattern, Decreased step length - right, Decreased step length - left, Decreased weight shift to right, Shuffle, Drifts right/left, Trunk flexed, Narrow base of support   Gait velocity interpretation: <1.31 ft/sec, indicative of household ambulator   General Gait Details: pt leaning to her left with support to stay in midline; able to advance each leg with very small steps; as cued to step around and  back up to bed she lunged to sit down on EOB prematurely (assisted to EOB by PT)  Stairs            Wheelchair Mobility     Tilt Bed    Modified Rankin (Stroke Patients Only)       Balance Overall balance assessment: Needs  assistance Sitting-balance support: Bilateral upper extremity supported, Feet supported Sitting balance-Leahy Scale: Poor   Postural control: Posterior lean, Left lateral lean Standing balance support: Bilateral upper extremity supported, Reliant on assistive device for balance Standing balance-Leahy Scale: Poor                               Pertinent Vitals/Pain Pain Assessment Pain Assessment: Faces Faces Pain Scale: Hurts even more Pain Location: anywhere PT touched her "ow" Pain Descriptors / Indicators: Moaning, Grimacing Pain Intervention(s): Limited activity within patient's tolerance, Monitored during session    Home Living Family/patient expects to be discharged to:: Private residence Living Arrangements: Other relatives (granddaughter and niece) Available Help at Discharge: Family;Available 24 hours/day Type of Home: House Home Access: Ramped entrance       Home Layout: One level Home Equipment: Agricultural consultant (2 wheels);Rollator (4 wheels);Standard Walker;Cane - single point;BSC/3in1;Wheelchair - manual;Hospital bed Additional Comments: per daughter    Prior Function Prior Level of Function : Needs assist             Mobility Comments: per pt's daughter, she will do more for some family members and less for others; she walks into bathroom 3x/wk for shower; daughter transfers her in/out of car for appointments (uses wheelchair) ADLs Comments: pts assist level varies depending on which family members are present (some do more for her and some make her do more); able to self-feed, transfer into shower onto BSC/shower seat     Extremity/Trunk Assessment   Upper Extremity Assessment Upper Extremity Assessment: Defer to OT evaluation    Lower Extremity Assessment Lower Extremity Assessment: Generalized weakness (?LLE weaker than RLE; ?slightly smaller steps with LLE compared to RLE (all were very small steps))    Cervical / Trunk  Assessment Cervical / Trunk Assessment: Kyphotic  Communication   Communication Communication: Hearing impairment Cueing Techniques: Verbal cues;Tactile cues  Cognition Arousal: Lethargic (initially; alert once EOB) Behavior During Therapy: Flat affect, Impulsive Overall Cognitive Status: History of cognitive impairments - at baseline                                 General Comments: Daughter present and reports pt at her baseline cognitively; difficulty understanding NOT to stand up from EOB as trying to don her brief        General Comments General comments (skin integrity, edema, etc.): Daughter present and encouraging pt to participate and show PT what she can do. Reports she is like this some days and some days she is better.    Exercises     Assessment/Plan    PT Assessment Patient needs continued PT services  PT Problem List Decreased strength;Decreased activity tolerance;Decreased balance;Decreased mobility;Decreased cognition;Decreased knowledge of use of DME;Decreased safety awareness;Pain       PT Treatment Interventions DME instruction;Gait training;Functional mobility training;Therapeutic activities;Therapeutic exercise;Neuromuscular re-education;Balance training;Cognitive remediation;Patient/family education    PT Goals (Current goals can be found in the Care Plan section)  Acute Rehab PT Goals Patient Stated Goal: unable to state; daughter wants her to  return home PT Goal Formulation: With family Time For Goal Achievement: 04/18/23 Potential to Achieve Goals: Fair    Frequency Min 1X/week     Co-evaluation               AM-PAC PT "6 Clicks" Mobility  Outcome Measure Help needed turning from your back to your side while in a flat bed without using bedrails?: Total Help needed moving from lying on your back to sitting on the side of a flat bed without using bedrails?: A Lot Help needed moving to and from a bed to a chair (including a  wheelchair)?: A Lot Help needed standing up from a chair using your arms (e.g., wheelchair or bedside chair)?: A Lot Help needed to walk in hospital room?: A Lot Help needed climbing 3-5 steps with a railing? : Total 6 Click Score: 10    End of Session   Activity Tolerance: Patient limited by fatigue Patient left: in bed;with call bell/phone within reach;with bed alarm set;with nursing/sitter in room;with family/visitor present Nurse Communication: Mobility status PT Visit Diagnosis: Unsteadiness on feet (R26.81);Other abnormalities of gait and mobility (R26.89);Muscle weakness (generalized) (M62.81)    Time: 1191-4782 PT Time Calculation (min) (ACUTE ONLY): 30 min   Charges:   PT Evaluation $PT Eval Low Complexity: 1 Low PT Treatments $Gait Training: 8-22 mins PT General Charges $$ ACUTE PT VISIT: 1 Visit          Jerolyn Center, PT Acute Rehabilitation Services  Office [phone]   Zena Amos 04/04/2023, 2:16 PM

## 2023-04-04 NOTE — Progress Notes (Signed)
OT Cancellation Note  Patient Details Name: Brandy Gonzales MRN: [id number] DOB: [birth year]   Cancelled Treatment:    Reason Eval/Treat Not Completed: Fatigue/lethargy limiting ability to participate. Pt fatigued following PT evaluation. Per family, pt is close to her baseline, but they would like to add HHOT to her discharge plan to encourage maximum participation in ADLs. OT will follow if pt remains inpatient overnight.   Evern Bio 04/04/2023, 2:22 PM Berna Spare, OTR/L Acute Rehabilitation Services Office: [phone]

## 2023-04-04 NOTE — Discharge Summary (Signed)
Physician Discharge Summary  Brandy Gonzales ZOX:[id number] DOB: 22-Jul-1940 DOA: 04/01/2023  PCP: Tresa Garter, MD  Admit date: 04/01/2023 Discharge date: 04/04/2023  Time spent: 40 minutes  Recommendations for Outpatient Follow-up:  Follow outpatient CBC/CMP  Follow with neurology outpatient - attention to keppra dosing with renal function Follow kidney function outpatient    Discharge Diagnoses:  Principal Problem:   Seizure Brandy Gonzales) Active Problems:   HTN (hypertension)   History of insertion of IVC (inferior vena caval) filter 2006   GERD   Glaucoma   OAB (overactive bladder)   Stage 3b chronic kidney disease (CKD) (HCC)   Dyslipidemia   History of DVT (deep vein thrombosis)   Anemia   Type 2 diabetes mellitus with diabetic chronic kidney disease (HCC)   Alzheimer's dementia (HCC)   Discharge Condition: stable  Diet recommendation: heart healthy, diabetic  Filed Weights   04/01/23 1249  Weight: 48.4 kg    History of present illness:   Brandy Gonzales is Brandy Gonzales 82 y.o. female with medical history significant of dementia, hypertension, hyperlipidemia, GERD, CKD 3B, anemia, DVT, glaucoma, OAB presenting with altered mental status as Brandy Gonzales code stroke.    Found to have status epilpeticus.   Now on keppra.  Neurology was consulted.  Stable for discharge 11/24.  See below for additional details.  Hospital Course:  Assessment and Plan:  Seizures Status Epilipeticus Altered mental status R/O CVA > Patient arrived as Brandy Gonzales code stroke with altered mental status and reported leftward gaze with left-sided shaking. > Met by neurology in the ED with suspicion for focal seizure and so received Ativan on await imaging. > Imaging included CT head (no acute findings), CTA head and neck (no emergent finding including LVO, mild for age atherosclerosis, no flow reducing stenosis or irregularity of major arteries in the head and neck), CT venogram without acute abnormality. EEG with  electrographic status epilepticus arising from right temporoparietal region.  As anti seizure medicines adjusted, status epilepticus resolved on 11/21 at 1411.  Subsequent EEG suggestive of moderate diffuse encephalopathy.   EEG 11/24 with evidence of epileptogenicity arising from right temporoparietal region - EEG pattern on the ictal interictal region with increased risk of seizure recurrence  MRI brain without acute findings - advanced chronic small vessel ischemic changes of the pons, cerebellum, thalmi, basal ganglia, and cerebral hemispheric white matter.  Old cortical infactions int hte left frontal lobe and right parietal lobe.  Chronic foci of hemosiderin deposition/susceptibility artifact in the parieto occipital junction regions on both sides.  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 Keppra 750 mg BID (higher than recommended for renal function, will continue after discussion with neurology) Appreciate neurology assistance - recommending keppra 750 mg BID and neurology follow up outpatient  Per [facility] statutes, patients with seizures are not allowed to drive until  they have been seizure-free for six months. Use caution when using heavy equipment or power tools. Avoid working on ladders or at heights. Take showers instead of baths. Ensure the water temperature is not too high on the home water heater. Do not go swimming alone. When caring for infants or small children, sit down when holding, feeding, or changing them to minimize risk of injury to the child in the event you have [name] seizure. Also, Maintain good sleep hygiene. Avoid alcohol.  E. Coli UTI Dysuria and UA concerning for UTI.  Discharge with duricef.     Urinary Retention resolved   Hypertension Resume coreg  Hyperlipidemia Not on meds   GERD Continue PPI when tolerating p.o.   CKD 3B Creatinine fluctuating - this actually appears more consistent with historic baseline  creatinine Monitor outpatient    History of DVT Not on anticoagulation at home IVC filter in place.   Diabetes A1c 01/2023, 6 Resume home amaryl at discharge     Procedures:  EEG IMPRESSION: This study showed evidence of epileptogenicity arising from right temporoparietal region. This EEG pattern is on the ictal-interictal region with increased risk of seizure recurrence. Additionally there is moderate diffuse encephalopathy. No definite seizures were seen throughout the recording.  EEG IMPRESSION: At the beginning of the study, EEG was consistent with electrographic status epilepticus arising from right temporoparietal region.  As anti-seizure medications were adjusted, status epilepticus resolved on 04/01/2023 at 1411.  Subsequently EEG was suggestive of moderate diffuse encephalopathy.  Consultations: neurology  Discharge Exam: Vitals:   04/04/23 0755 04/04/23 1126  BP: (!) 174/67 (!) 151/78  Pulse: (!) 53 94  Resp: 17 17  Temp: 97.9 F (36.6 C) 97.8 F (36.6 C)  SpO2: 100% 98%   No complaints Daughter at bedside  General: No acute distress. Cardiovascular: Heart sounds show [name] regular rate, and rhythm. No gallops or rubs. No murmurs. No JVD. Lungs: Clear to auscultation bilaterally with good air movement. No rales, rhonchi or wheezes. Abdomen: Soft, nontender, nondistended with normal active bowel sounds. No masses. No hepatosplenomegaly. Neurological: Alert and oriented 3. Moves all extremities 4. Cranial nerves II through XII grossly intact Extremities: No clubbing or cyanosis. No edema.  Discharge Instructions   Discharge Instructions     Ambulatory referral to Neurology   Complete by: As directed    An appointment is requested in approximately: 4 weeks   Call MD for:  difficulty breathing, headache or visual disturbances   Complete by: As directed    Call MD for:  extreme fatigue   Complete by: As directed    Call MD for:  hives   Complete by: As  directed    Call MD for:  persistant dizziness or light-headedness   Complete by: As directed    Call MD for:  persistant nausea and vomiting   Complete by: As directed    Call MD for:  redness, tenderness, or signs of infection (pain, swelling, redness, odor or green/yellow discharge around incision site)   Complete by: As directed    Call MD for:  severe uncontrolled pain   Complete by: As directed    Call MD for:  temperature >100.4   Complete by: As directed    Diet - low sodium heart healthy   Complete by: As directed    Discharge instructions   Complete by: As directed    You were seen for new seizures.  We've started you on an antiseizure medicine.  Per [facility] statutes, patients with seizures are not allowed to drive until  they have been seizure-free for six months. Use caution when using heavy equipment or power tools. Avoid working on ladders or at heights. Take showers instead of baths. Ensure the water temperature is not too high on the home water heater. Do not go swimming alone. When caring for infants or small children, sit down when holding, feeding, or changing them to minimize risk of injury to the child in the event you have Taim Wurm seizure. Also, Maintain good sleep hygiene. Avoid alcohol.  Your MRI showed evidence of prior hemorrhagic infarctions (strokes due to bleeding) as well as  Kristyna Bradstreet possible carvernoma (vascular malformation).  This maybe the cause of your seizures.    You had Edgardo Petrenko UTI.  This also likely predisposed you to seizures in the setting of above.  We'll send you home with antibiotics.  Your kidney function is Tauriel Scronce little bit down.  Please make sure you're eating/drinking well and staying hydrated.  You should have repeat labs within the next week or so.    Return for new, recurrent, or worsening symptoms.  Please ask your PCP to request records from this hospitalization so they know what was done and what the next steps will be.   Increase activity  slowly   Complete by: As directed       Allergies as of 04/04/2023       Reactions   Amlodipine Besylate Other (See Comments)   Hair loss   Atenolol Other (See Comments)   Fluid retention   Oxycodone Hcl Other (See Comments)   Dizziness   Pork-derived Products Other (See Comments)   Religious preference   Propoxyphene N-acetaminophen Other (See Comments)   Unknown reaction   Enalapril Maleate Palpitations        Medication List     TAKE these medications    acetaminophen 500 MG tablet Commonly known as: TYLENOL Take 2 tablets (1,000 mg total) by mouth every 8 (eight) hours as needed for mild pain, moderate pain, fever or headache.   aspirin EC 81 MG tablet Take 81 mg by mouth at bedtime. Swallow whole.   carvedilol 25 MG tablet Commonly known as: COREG TAKE 1 TABLET(25 MG) BY MOUTH TWICE DAILY WITH Serapio Edelson MEAL   cefadroxil 500 MG capsule Commonly known as: DURICEF Take 1 capsule (500 mg total) by mouth daily for 4 days. Start taking on: April 05, 2023   glimepiride 2 MG tablet Commonly known as: AMARYL Take 0.5 tablets (1 mg total) by mouth daily before breakfast.   Gvoke HypoPen 1-Pack 1 MG/0.2ML Soaj Generic drug: Glucagon Inject 0.2 mLs into the skin daily as needed.   levETIRAcetam 750 MG tablet Commonly known as: KEPPRA Take 1 tablet (750 mg total) by mouth 2 (two) times daily.   omeprazole 20 MG capsule Commonly known as: PRILOSEC TAKE 1 CAPSULE(20 MG) BY MOUTH DAILY       Allergies  Allergen Reactions   Amlodipine Besylate Other (See Comments)    Hair loss   Atenolol Other (See Comments)    Fluid retention   Oxycodone Hcl Other (See Comments)    Dizziness    Pork-Derived Products Other (See Comments)    Religious preference   Propoxyphene N-Acetaminophen Other (See Comments)    Unknown reaction   Enalapril Maleate Palpitations      The results of significant diagnostics from this hospitalization (including imaging, microbiology,  ancillary and laboratory) are listed below for reference.    Significant Diagnostic Studies: Overnight EEG with video  Result Date: 04/04/2023 Charlsie Quest, MD     04/04/2023  6:07 AM Patient Name: Brandy Gonzales MRN: [id number] Epilepsy Attending: Charlsie Quest Referring Physician/Provider: Rejeana Brock, MD Duration: 04/03/2023 1343 to 04/04/2023 0600  Patient history: 82 yo F brought in with forced L gaze, L arm and L leg rhythmic twitching. EEG to evaluate for seizure  Level of alertness: Awake, asleep  AEDs during EEG study: LEV  Technical aspects: This EEG study was done with scalp electrodes positioned according to the 10-20 International system of electrode placement. Electrical activity was reviewed with band pass filter  of 1-70Hz , sensitivity of 7 uV/mm, display speed of 54mm/sec with Laurette Villescas 60Hz  notched filter applied as appropriate. EEG data were recorded continuously and digitally stored.  Video monitoring was available and reviewed as appropriate.  Description:  The posterior dominant rhythm consists of 7 Hz activity of moderate voltage (25-35 uV) seen predominantly in posterior head regions, symmetric and reactive to eye opening and eye closing. Sleep was characterized by vertex waves, sleep spindles (12 to 14 Hz), maximal frontocentral region. EEG showed continuous generalized 3 to 6 Hz theta-delta slowing. Lateralized periodic discharges were noted arising from right temporoparietal region with fluctuating frequency of 0.5-4Hz , at times with overriding fast activity. Hyperventilation and photic stimulation were not performed.    ABNORMALITY - Lateralized periodic discharges, right temporoparietal region - Continuous slow, generalized  IMPRESSION: This study showed evidence of epileptogenicity arising from right temporoparietal region. This EEG pattern is on the ictal-interictal region with increased risk of seizure recurrence. Additionally there is moderate diffuse encephalopathy. No  definite seizures were seen throughout the recording.  Charlsie Quest   MR BRAIN WO CONTRAST  Result Date: 04/03/2023 CLINICAL DATA:  Seizure, new onset, no history of trauma. EXAM: MRI HEAD WITHOUT CONTRAST TECHNIQUE: Multiplanar, multiecho pulse sequences of the brain and surrounding structures were obtained without intravenous contrast. COMPARISON:  CT studies 2 days ago.  MRI 11/16/2021. FINDINGS: Brain: Diffusion imaging does not show any acute or subacute infarction or other cause of restricted diffusion. Chronic small-vessel ischemic changes are seen affecting the pons. There are numerous old small vessel infarctions of the cerebellum. Cerebral hemispheres show advanced chronic small-vessel ischemic changes affecting the thalami, basal ganglia and throughout the cerebral hemispheric deep and subcortical white matter. Old cortical infarctions are seen in the left frontal lobe and right parietal lobe. Chronic foci of hemosiderin deposition/susceptibility artifact Prevident in the parieto-occipital junction regions on both sides. These are favored to be old hemorrhagic infarctions, though the possibility of Biff Rutigliano cavernoma on the right is not excluded. No evidence of new hemorrhage since the prior studies. No evidence of mass, hydrocephalus or extra-axial collection. Ventricular prominence is in proportion to the degree of deep white matter disease. Vascular: Major vessels at the base of the brain show flow. Skull and upper cervical spine: Negative Sinuses/Orbits: Clear/normal Other: None IMPRESSION: 1. No acute finding by MRI. Advanced chronic small-vessel ischemic changes of the pons, cerebellum, thalami, basal ganglia and cerebral hemispheric white matter. 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 2. Chronic foci of hemosiderin deposition/susceptibility artifact in the parieto-occipital junction regions on both sides. These are favored to be old hemorrhagic infarctions,  though the possibility of Yeila Morro cavernoma on the right is not excluded. No evidence of new hemorrhage since the prior studies. Electronically Signed   By: Paulina Fusi M.D.   On: 04/03/2023 11:55   Overnight EEG with video  Result Date: 04/02/2023 Charlsie Quest, MD     04/02/2023  6:26 PM Patient Name: Brandy Gonzales MRN: [id number] Epilepsy Attending: Charlsie Quest Referring Physician/Provider: Erick Blinks, MD Duration: 04/01/2023 1403 to 04/02/2023 1403 Patient history: 82 yo F brought in with forced L gaze, L arm and L leg rhythmic twitching. EEG to evaluate for seizure Level of alertness: Awake, asleep AEDs during EEG study: LEV Technical aspects: This EEG study was done with scalp electrodes positioned according to the 10-20 International system of electrode placement. Electrical activity was reviewed with band pass filter of 1-70Hz , sensitivity of 7 uV/mm, display  speed of 60mm/sec with Hadia Minier 60Hz  notched filter applied as appropriate. EEG data were recorded continuously and digitally stored.  Video monitoring was available and reviewed as appropriate. Description: At the beginning of the study, EEG showed sharply contoured 5 to 6 Hz theta slowing in right hemisphere, maximal right temporoparietal region admixed with sharp waves.  Gradually EEG evolved into 2 to 3 Hz delta slowing.  No clinical signs were noted on video.  This EEG pattern was consistent with focal electrographic status epilepticus.  As antiseizure medications were adjusted, status epilepticus resolved on 04/01/2023 at 1411. Subsequently EEG showed continuous generalized 3-7hz  theta-delta slowing.  Gradually EEG improved and showed posterior dominant rhythm of 6 Hz activity of moderate voltage (25-35 uV) seen predominantly in posterior head regions, symmetric and reactive to eye opening and eye closing. Sleep was characterized by vertex waves, sleep spindles (12 to 14 Hz), maximal frontocentral region. Hyperventilation and photic  stimulation were not performed.   ABNORMALITY -Electrographic status epilepticus, right temporoparietal region - Continuous slow, generalized IMPRESSION: At the beginning of the study, EEG was consistent with electrographic status epilepticus arising from right temporoparietal region.  As anti-seizure medications were adjusted, status epilepticus resolved on 04/01/2023 at 1411.  Subsequently EEG was suggestive of moderate diffuse encephalopathy. Charlsie Quest   CT VENOGRAM HEAD  Result Date: 04/01/2023 CLINICAL DATA:  Code stroke.  Subcortical high density by head CT. EXAM: CT VENOGRAM HEAD TECHNIQUE: Venographic phase images of the brain were obtained following the administration of intravenous contrast. Multiplanar reformats and maximum intensity projections were generated. RADIATION DOSE REDUCTION: This exam was performed according to the departmental dose-optimization program which includes automated exposure control, adjustment of the mA and/or kV according to patient size and/or use of iterative reconstruction technique. CONTRAST:  75mL OMNIPAQUE IOHEXOL 350 MG/ML SOLN COMPARISON:  Preceding head CT. FINDINGS: The dural venous sinuses are diffusely patent no stricture or diverticulum seen. No evidence of central or cortical venous thrombosis. The subcortical high density in the right parietal region is again noted without convincing adjacent enhancement. No abnormal parenchymal enhancement seen throughout the brain. IMPRESSION: No dural venous sinus thrombosis or other acute venous finding. Electronically Signed   By: Tiburcio Pea M.D.   On: 04/01/2023 13:10   CT ANGIO HEAD NECK W WO CM (CODE STROKE)  Result Date: 04/01/2023 CLINICAL DATA:  Assess intracranial arteries. EXAM: CT ANGIOGRAPHY HEAD AND NECK WITH AND WITHOUT CONTRAST TECHNIQUE: Multidetector CT imaging of the head and neck was performed using the standard protocol during bolus administration of intravenous contrast. Multiplanar CT  image reconstructions and MIPs were obtained to evaluate the vascular anatomy. Carotid stenosis measurements (when applicable) are obtained utilizing NASCET criteria, using the distal internal carotid diameter as the denominator. RADIATION DOSE REDUCTION: This exam was performed according to the departmental dose-optimization program which includes automated exposure control, adjustment of the mA and/or kV according to patient size and/or use of iterative reconstruction technique. CONTRAST:  75 cc Omnipaque 350 intravenous COMPARISON:  Head CT from earlier today. CT of the head neck 04/27/2022 FINDINGS: CTA NECK FINDINGS Aortic arch: Atheromatous calcification with 3 vessel branching. Right carotid system: Atheromatous plaque at the proximal ICA is mild. No flow reducing stenosis Left carotid system: Vessels are smoothly contoured and widely patent with less than typical atheromatous change Vertebral arteries: No proximal subclavian or vertebral stenosis. No beading or dissection. Skeleton: Negative. Other neck: Negative. Upper chest: Negative Review of the MIP images confirms the above findings CTA HEAD FINDINGS Anterior  circulation: No significant stenosis, proximal occlusion, aneurysm, or vascular malformation. Atheromatous calcification of the carotid siphons. Posterior circulation: The vertebral and basilar arteries are smoothly contoured and diffusely patent. Atheromatous irregularity of the posterior cerebral arteries asymmetric to the left but mild. No proximal flow reducing stenosis. Venous sinuses: Unremarkable Anatomic variants: Unremarkable Review of the MIP images confirms the above findings IMPRESSION: 1. No emergent finding including large vessel occlusion. 2. Mild for age atherosclerosis. No flow reducing stenosis or irregularity of major arteries in the head and neck. Electronically Signed   By: Tiburcio Pea M.D.   On: 04/01/2023 13:07   CT HEAD CODE STROKE WO CONTRAST  Result Date:  04/01/2023 CLINICAL DATA:  Code stroke.  Unresponsive EXAM: CT HEAD WITHOUT CONTRAST TECHNIQUE: Contiguous axial images were obtained from the base of the skull through the vertex without intravenous contrast. RADIATION DOSE REDUCTION: This exam was performed according to the departmental dose-optimization program which includes automated exposure control, adjustment of the mA and/or kV according to patient size and/or use of iterative reconstruction technique. COMPARISON:  04/27/2022 brain MRI FINDINGS: Brain: No evidence of acute infarction, hemorrhage, hydrocephalus, extra-axial collection or mass lesion/mass effect. Chronic small vessel ischemic gliosis in the cerebral white matter that is confluent. Small chronic left cerebellar infarct also seen on prior brain MRI. There is cerebral volume loss with ventriculomegaly. High-density in the subcortical right parietal lobe measuring 6 mm, stable based on prior reformats and with T1 hyperintensity based on prior brain MRI, possible cavernoma. Vascular: No hyperdense vessel or unexpected calcification. Skull: Normal. Negative for fracture or focal lesion. Sinuses/Orbits: No acute finding. Other: Prelim sent in epic chat. ASPECTS [facility] Stroke Program Early CT Score) Not scored with this history IMPRESSION: 1. No acute finding. 2. Extensive chronic small vessel disease. 3. Chronic mineralization along the right parietal cortex, possible cavernoma. Electronically Signed   By: Tiburcio Pea M.D.   On: 04/01/2023 12:50    Microbiology: Recent Results (from the past 240 hour(s))  Urine Culture (for pregnant, neutropenic or urologic patients or patients with an indwelling urinary catheter)     Status: Abnormal   Collection Time: 04/02/23  7:52 AM   Specimen: Urine, Clean Catch  Result Value Ref Range Status   Specimen Description URINE, CLEAN CATCH  Final   Special Requests   Final    NONE Performed at [facility], [address], [place],  [place] [address]    Culture >=100,000 COLONIES/mL ESCHERICHIA COLI (Araiyah Cumpton)  Final   Report Status 04/04/2023 FINAL  Final   Organism ID, Bacteria ESCHERICHIA COLI (Alexzia Kasler)  Final      Susceptibility   Escherichia coli - MIC*    AMPICILLIN >=32 RESISTANT Resistant     CEFAZOLIN <=4 SENSITIVE Sensitive     CEFEPIME <=0.12 SENSITIVE Sensitive     CEFTRIAXONE <=0.25 SENSITIVE Sensitive     CIPROFLOXACIN <=0.25 SENSITIVE Sensitive     GENTAMICIN <=1 SENSITIVE Sensitive     IMIPENEM <=0.25 SENSITIVE Sensitive     NITROFURANTOIN <=16 SENSITIVE Sensitive     TRIMETH/SULFA >=320 RESISTANT Resistant     AMPICILLIN/SULBACTAM 16 INTERMEDIATE Intermediate     PIP/TAZO <=4 SENSITIVE Sensitive ug/mL    * >=100,000 COLONIES/mL ESCHERICHIA COLI     Labs: Basic Metabolic Panel: Recent Labs  Lab 04/01/23 1241 04/01/23 2029 04/02/23 0520 04/03/23 0425 04/04/23 0822  NA 143 140 142 143 143  K 4.9 4.2 4.8 3.8 4.2  CL 116* 114* 113* 114* 113*  CO2  --  19* 14* 18* 21*  GLUCOSE 122* 110* 76 122* 190*  BUN 24* 18 20 21 21   CREATININE 1.00 1.11* 1.28* 1.20* 1.39*  CALCIUM  --  9.9 9.6 10.0 9.7  MG  --   --   --  2.0  --   PHOS  --   --   --  3.2  --    Liver Function Tests: Recent Labs  Lab 04/01/23 2029 04/02/23 0520 04/03/23 0425  AST 30 26 26   ALT 35 32 33  ALKPHOS 125 113 119  BILITOT 1.0 1.0 1.1  PROT 6.1* 5.6* 5.9*  ALBUMIN 3.3* 3.1* 3.2*   No results for input(s): "LIPASE", "AMYLASE" in the last 168 hours. No results for input(s): "AMMONIA" in the last 168 hours. CBC: Recent Labs  Lab 04/01/23 1232 04/01/23 1241 04/02/23 0520 04/03/23 0425  WBC 7.1  --  9.1 7.8  NEUTROABS 4.5  --   --  5.9  HGB 12.0 11.6* 13.1 12.3  HCT 36.5 34.0* 41.0 37.0  MCV 94.8  --  96.2 92.0  PLT 156  --  142* 187   Cardiac Enzymes: No results for input(s): "CKTOTAL", "CKMB", "CKMBINDEX", "TROPONINI" in the last 168 hours. BNP: BNP (last 3 results) Recent Labs    08/07/22 1625 10/15/22 0936  BNP  354.8* 162.0*    ProBNP (last 3 results) No results for input(s): "PROBNP" in the last 8760 hours.  CBG: Recent Labs  Lab 04/03/23 2013 04/03/23 2335 04/04/23 0353 04/04/23 0757 04/04/23 1128  GLUCAP 273* 226* 209* 175* 206*       Signed:  Lacretia Nicks MD.  [facility] 04/04/2023, 3:08 PM

## 2023-04-05 ENCOUNTER — Telehealth: Payer: Self-pay | Admitting: *Deleted

## 2023-04-05 NOTE — Transitions of Care (Post Inpatient/ED Visit) (Signed)
04/05/2023  Name: Brandy Gonzales MRN: [id number] DOB: 29-Sep-1940  Today's TOC FU Call Status: Today's TOC FU Call Status:: Successful TOC FU Call Completed TOC FU Call Complete Date: 04/05/23 Patient's Name and Date of Birth confirmed.  Transition Care Management Follow-up Telephone Call Date of Discharge: 04/04/23 Discharge Facility: [facility]) Type of Discharge: Inpatient Admission Primary Inpatient Discharge Diagnosis:: Seizures/ status epliepticus; UTI How have you been since you were released from the hospital?: Better (per daughter/ POA: "She is doing okay; we have it all down to a routine with her care; she is never ever by herself, someone is always with her.  I manage all of her health care needs and medicines.  No problems so far, we've been doing this a long time") Any questions or concerns?: No  Items Reviewed: Did you receive and understand the discharge instructions provided?: Yes (thoroughly reviewed with patient's daughter/ caregiver who verbalizes good understanding of same) Medications obtained,verified, and reconciled?: Yes (Medications Reviewed) (Full medication reconciliation/ review completed; no concerns or discrepancies identified; confirmed patient obtained/ is taking all newly Rx'd medications as instructed; dghtr-manages medications and denies questions/ concerns around medications today) Any new allergies since your discharge?: No Dietary orders reviewed?: Yes Type of Diet Ordered:: "Whatever she will eat" Do you have support at home?: Yes People in Home: grandchild(ren) Name of Support/Comfort Primary Source: caregiver/ daughter reports patient requires assistance with all care needs- reports resides with her granddaughters who assist in all care needs as indicated; daughter oversees and supervises all care, manages all aspects of medical affairs  Medications Reviewed Today: Medications Reviewed Today     Reviewed by Michaela Corner, RN (Registered  Nurse) on 04/05/23 at 1458  Med List Status: <None>   Medication Order Taking? Sig Documenting Provider Last Dose Status Informant  acetaminophen (TYLENOL) 500 MG tablet [id number] Yes Take 2 tablets (1,000 mg total) by mouth every 8 (eight) hours as needed for mild pain, moderate pain, fever or headache. Dorcas Carrow, MD Taking Active Child, Pharmacy Records  aspirin EC 81 MG tablet [id number] Yes Take 81 mg by mouth at bedtime. Swallow whole. [provider] Taking Active Child, Pharmacy Records           Med Note (CRUTHIS, CHLOE C   Sat Jan 30, 2023  9:25 AM)    carvedilol (COREG) 25 MG tablet [id number] Yes TAKE 1 TABLET(25 MG) BY MOUTH TWICE DAILY WITH A MEAL [name], [name], MD Taking Active Child, Pharmacy Records  cefadroxil (DURICEF) 500 MG capsule [id number] Yes Take 1 capsule (500 mg total) by mouth daily for 4 days. Zigmund Daniel., MD Taking Active   glimepiride [facility]) 2 MG tablet [id number] Yes Take 0.5 tablets (1 mg total) by mouth daily before breakfast. [name], [name], MD Taking Active Child, Pharmacy Records  Glucagon (GVOKE HYPOPEN 1-PACK) 1 MG/0.2ML SOAJ [id number] Yes Inject 0.2 mLs into the skin daily as needed. [name], [name], MD Taking Active Child, Pharmacy Records  levETIRAcetam (KEPPRA) 750 MG tablet [id number] Yes Take 1 tablet (750 mg total) by mouth 2 (two) times daily. Zigmund Daniel., MD Taking Active   omeprazole (PRILOSEC) 20 MG capsule [id number] Yes TAKE 1 CAPSULE(20 MG) BY MOUTH DAILY [name], [name], MD Taking Active Child, Pharmacy Records  Med List Note Epimenio Sarin Webster, CPhT 01/30/23 1884): Daughter handles medications.            Home Care and Equipment/Supplies: Were Home Health Services Ordered?: Yes Name of Home  Health Agency:: [facility]-- confirmed active with patient prior to recent hospitalization; daughter confirms agency has contacted her post-hospital discharge to resume home health visits Has Agency set  up a time to come to your home?: Yes First Home Health Visit Date: 04/06/23 Any new equipment or medical supplies ordered?: No  Functional Questionnaire: Do you need assistance with bathing/showering or dressing?: Yes (caregiver/ daughter reports patient requires assistance with all care needs- reports resides with her granddaughters who assist in all care needs as indicated; daughter oversees and supervises all care, manages all aspects of medical affairs) Do you need assistance with meal preparation?: Yes (caregiver/ daughter reports patient requires assistance with all care needs- reports resides with her granddaughters who assist in all care needs as indicated; daughter oversees and supervises all care, manages all aspects of medical affairs) Do you need assistance with eating?: No (daughter reports patient is able to feed herself) Do you have difficulty maintaining continence: Yes (caregiver/ daughter reports patient requires assistance with all care needs- reports resides with her granddaughters who assist in all care needs as indicated; daughter oversees and supervises all care, manages all aspects of medical affairs) Do you need assistance with getting out of bed/getting out of a chair/moving?: Yes (caregiver/ daughter reports patient requires assistance with all care needs- reports resides with her granddaughters who assist in all care needs as indicated; daughter oversees and supervises all care, manages all aspects of medical affairs) Do you have difficulty managing or taking your medications?: Yes (caregiver/ daughter reports patient requires assistance with all care needs- reports resides with her granddaughters who assist in all care needs as indicated; daughter oversees and supervises all care, manages all aspects of medical affairs)  Follow up appointments reviewed: PCP Follow-up appointment confirmed?: Yes Date of PCP follow-up appointment?: 04/13/23 Follow-up Provider:  PCP Specialist Hospital Follow-up appointment confirmed?: No Reason Specialist Follow-Up Not Confirmed: Patient has Specialist Provider Number and will Call for Appointment (caregiver reports she has already called neurology provider this morning and is awaiting call-back; confirms she has phone nnumber for neurology office) Do you need transportation to your follow-up appointment?: No Do you understand care options if your condition(s) worsen?: Yes-patient verbalized understanding  SDOH Interventions Today    Flowsheet Row Most Recent Value  SDOH Interventions   Food Insecurity Interventions Intervention Not Indicated  Housing Interventions Intervention Not Indicated  Transportation Interventions Intervention Not Indicated  [daughter/ POA provides all transportation]  Utilities Interventions Intervention Not Indicated      Additional TOC interventions: Confirmed uses assistive devices on regular basis, at baseline -- "mainly the wheelchair; sometimes the walker" per caregiver report Reinforced need to ensure patient stays well-hydrated Patient's caregiver/ daughter Avon Gully declines need for ongoing/ further care management outreach; declines enrollment in 30-day TOC program; provided my direct contact information should questions/ concerns/ needs arise post-TOC call   Caryl Pina, RN, BSN, CCRN Alumnus RN Care Manager  Transitions of Care  [facility] [phone]: direct office

## 2023-04-07 ENCOUNTER — Telehealth: Payer: Self-pay | Admitting: Internal Medicine

## 2023-04-07 DIAGNOSIS — E1165 Type 2 diabetes mellitus with hyperglycemia: Secondary | ICD-10-CM | POA: Diagnosis not present

## 2023-04-07 DIAGNOSIS — N390 Urinary tract infection, site not specified: Secondary | ICD-10-CM | POA: Diagnosis not present

## 2023-04-07 DIAGNOSIS — B9620 Unspecified Escherichia coli [E. coli] as the cause of diseases classified elsewhere: Secondary | ICD-10-CM | POA: Diagnosis not present

## 2023-04-07 DIAGNOSIS — G9341 Metabolic encephalopathy: Secondary | ICD-10-CM | POA: Diagnosis not present

## 2023-04-07 DIAGNOSIS — I130 Hypertensive heart and chronic kidney disease with heart failure and stage 1 through stage 4 chronic kidney disease, or unspecified chronic kidney disease: Secondary | ICD-10-CM | POA: Diagnosis not present

## 2023-04-07 DIAGNOSIS — I509 Heart failure, unspecified: Secondary | ICD-10-CM | POA: Diagnosis not present

## 2023-04-07 NOTE — Telephone Encounter (Signed)
HH ORDERS   Caller Name: [facility] Name: [facility] Phone #: [phone](secure)  Service Requested: PT (examples: OT/PT/Skilled Nursing/Social Work/Speech Therapy/Wound Care)  Frequency of Visits: 1X a week for 4 weeks, every other week for for weeks

## 2023-04-09 DIAGNOSIS — R4189 Other symptoms and signs involving cognitive functions and awareness: Secondary | ICD-10-CM | POA: Diagnosis not present

## 2023-04-09 DIAGNOSIS — E559 Vitamin D deficiency, unspecified: Secondary | ICD-10-CM | POA: Diagnosis not present

## 2023-04-09 DIAGNOSIS — E1122 Type 2 diabetes mellitus with diabetic chronic kidney disease: Secondary | ICD-10-CM | POA: Diagnosis not present

## 2023-04-09 DIAGNOSIS — E785 Hyperlipidemia, unspecified: Secondary | ICD-10-CM | POA: Diagnosis not present

## 2023-04-09 DIAGNOSIS — I509 Heart failure, unspecified: Secondary | ICD-10-CM | POA: Diagnosis not present

## 2023-04-09 DIAGNOSIS — L259 Unspecified contact dermatitis, unspecified cause: Secondary | ICD-10-CM | POA: Diagnosis not present

## 2023-04-09 DIAGNOSIS — N3281 Overactive bladder: Secondary | ICD-10-CM | POA: Diagnosis not present

## 2023-04-09 DIAGNOSIS — H409 Unspecified glaucoma: Secondary | ICD-10-CM | POA: Diagnosis not present

## 2023-04-09 DIAGNOSIS — E1165 Type 2 diabetes mellitus with hyperglycemia: Secondary | ICD-10-CM | POA: Diagnosis not present

## 2023-04-09 DIAGNOSIS — Z95828 Presence of other vascular implants and grafts: Secondary | ICD-10-CM | POA: Diagnosis not present

## 2023-04-09 DIAGNOSIS — I130 Hypertensive heart and chronic kidney disease with heart failure and stage 1 through stage 4 chronic kidney disease, or unspecified chronic kidney disease: Secondary | ICD-10-CM | POA: Diagnosis not present

## 2023-04-09 DIAGNOSIS — G9341 Metabolic encephalopathy: Secondary | ICD-10-CM | POA: Diagnosis not present

## 2023-04-09 DIAGNOSIS — G309 Alzheimer's disease, unspecified: Secondary | ICD-10-CM | POA: Diagnosis not present

## 2023-04-09 DIAGNOSIS — F0280 Dementia in other diseases classified elsewhere without behavioral disturbance: Secondary | ICD-10-CM | POA: Diagnosis not present

## 2023-04-09 DIAGNOSIS — B9620 Unspecified Escherichia coli [E. coli] as the cause of diseases classified elsewhere: Secondary | ICD-10-CM | POA: Diagnosis not present

## 2023-04-09 DIAGNOSIS — Z7982 Long term (current) use of aspirin: Secondary | ICD-10-CM | POA: Diagnosis not present

## 2023-04-09 DIAGNOSIS — E538 Deficiency of other specified B group vitamins: Secondary | ICD-10-CM | POA: Diagnosis not present

## 2023-04-09 DIAGNOSIS — K219 Gastro-esophageal reflux disease without esophagitis: Secondary | ICD-10-CM | POA: Diagnosis not present

## 2023-04-09 DIAGNOSIS — N1832 Chronic kidney disease, stage 3b: Secondary | ICD-10-CM | POA: Diagnosis not present

## 2023-04-09 DIAGNOSIS — G510 Bell's palsy: Secondary | ICD-10-CM | POA: Diagnosis not present

## 2023-04-09 DIAGNOSIS — G40901 Epilepsy, unspecified, not intractable, with status epilepticus: Secondary | ICD-10-CM | POA: Diagnosis not present

## 2023-04-09 DIAGNOSIS — N390 Urinary tract infection, site not specified: Secondary | ICD-10-CM | POA: Diagnosis not present

## 2023-04-09 DIAGNOSIS — E1140 Type 2 diabetes mellitus with diabetic neuropathy, unspecified: Secondary | ICD-10-CM | POA: Diagnosis not present

## 2023-04-09 DIAGNOSIS — D500 Iron deficiency anemia secondary to blood loss (chronic): Secondary | ICD-10-CM | POA: Diagnosis not present

## 2023-04-09 DIAGNOSIS — Z7984 Long term (current) use of oral hypoglycemic drugs: Secondary | ICD-10-CM | POA: Diagnosis not present

## 2023-04-12 DIAGNOSIS — N390 Urinary tract infection, site not specified: Secondary | ICD-10-CM | POA: Diagnosis not present

## 2023-04-12 DIAGNOSIS — G40901 Epilepsy, unspecified, not intractable, with status epilepticus: Secondary | ICD-10-CM | POA: Diagnosis not present

## 2023-04-12 DIAGNOSIS — E1165 Type 2 diabetes mellitus with hyperglycemia: Secondary | ICD-10-CM | POA: Diagnosis not present

## 2023-04-12 DIAGNOSIS — G9341 Metabolic encephalopathy: Secondary | ICD-10-CM | POA: Diagnosis not present

## 2023-04-12 DIAGNOSIS — B9620 Unspecified Escherichia coli [E. coli] as the cause of diseases classified elsewhere: Secondary | ICD-10-CM | POA: Diagnosis not present

## 2023-04-12 DIAGNOSIS — I130 Hypertensive heart and chronic kidney disease with heart failure and stage 1 through stage 4 chronic kidney disease, or unspecified chronic kidney disease: Secondary | ICD-10-CM | POA: Diagnosis not present

## 2023-04-13 ENCOUNTER — Other Ambulatory Visit (INDEPENDENT_AMBULATORY_CARE_PROVIDER_SITE_OTHER): Payer: Medicare Other

## 2023-04-13 ENCOUNTER — Ambulatory Visit (INDEPENDENT_AMBULATORY_CARE_PROVIDER_SITE_OTHER): Payer: Medicare Other | Admitting: Internal Medicine

## 2023-04-13 VITALS — BP 124/78 | HR 60 | Temp 97.9°F | Ht 59.0 in

## 2023-04-13 DIAGNOSIS — E1165 Type 2 diabetes mellitus with hyperglycemia: Secondary | ICD-10-CM

## 2023-04-13 DIAGNOSIS — N1832 Chronic kidney disease, stage 3b: Secondary | ICD-10-CM

## 2023-04-13 DIAGNOSIS — Z7984 Long term (current) use of oral hypoglycemic drugs: Secondary | ICD-10-CM

## 2023-04-13 DIAGNOSIS — E1121 Type 2 diabetes mellitus with diabetic nephropathy: Secondary | ICD-10-CM

## 2023-04-13 DIAGNOSIS — I10 Essential (primary) hypertension: Secondary | ICD-10-CM | POA: Diagnosis not present

## 2023-04-13 DIAGNOSIS — R7989 Other specified abnormal findings of blood chemistry: Secondary | ICD-10-CM

## 2023-04-13 DIAGNOSIS — R569 Unspecified convulsions: Secondary | ICD-10-CM

## 2023-04-13 DIAGNOSIS — G301 Alzheimer's disease with late onset: Secondary | ICD-10-CM

## 2023-04-13 DIAGNOSIS — E1122 Type 2 diabetes mellitus with diabetic chronic kidney disease: Secondary | ICD-10-CM | POA: Diagnosis not present

## 2023-04-13 LAB — CBC WITH DIFFERENTIAL/PLATELET
Basophils Absolute: 0.1 10*3/uL (ref 0.0–0.1)
Basophils Relative: 0.7 % (ref 0.0–3.0)
Eosinophils Absolute: 0.2 10*3/uL (ref 0.0–0.7)
Eosinophils Relative: 2.3 % (ref 0.0–5.0)
HCT: 39.6 % (ref 36.0–46.0)
Hemoglobin: 13 g/dL (ref 12.0–15.0)
Lymphocytes Relative: 21.6 % (ref 12.0–46.0)
Lymphs Abs: 1.6 10*3/uL (ref 0.7–4.0)
MCHC: 32.8 g/dL (ref 30.0–36.0)
MCV: 96.3 fL (ref 78.0–100.0)
Monocytes Absolute: 0.5 10*3/uL (ref 0.1–1.0)
Monocytes Relative: 6.3 % (ref 3.0–12.0)
Neutro Abs: 5 10*3/uL (ref 1.4–7.7)
Neutrophils Relative %: 69.1 % (ref 43.0–77.0)
Platelets: 178 10*3/uL (ref 150.0–400.0)
RBC: 4.11 Mil/uL (ref 3.87–5.11)
RDW: 14 % (ref 11.5–15.5)
WBC: 7.3 10*3/uL (ref 4.0–10.5)

## 2023-04-13 LAB — COMPREHENSIVE METABOLIC PANEL
ALT: 66 U/L — ABNORMAL HIGH (ref 0–35)
AST: 73 U/L — ABNORMAL HIGH (ref 0–37)
Albumin: 3.5 g/dL (ref 3.5–5.2)
Alkaline Phosphatase: 157 U/L — ABNORMAL HIGH (ref 39–117)
BUN: 28 mg/dL — ABNORMAL HIGH (ref 6–23)
CO2: 23 meq/L (ref 19–32)
Calcium: 9.7 mg/dL (ref 8.4–10.5)
Chloride: 113 meq/L — ABNORMAL HIGH (ref 96–112)
Creatinine, Ser: 1.16 mg/dL (ref 0.40–1.20)
GFR: 43.67 mL/min — ABNORMAL LOW (ref >60.00)
Glucose, Bld: 238 mg/dL — ABNORMAL HIGH (ref 70–99)
Potassium: 4.2 meq/L (ref 3.5–5.1)
Sodium: 143 meq/L (ref 135–145)
Total Bilirubin: 0.7 mg/dL (ref 0.2–1.2)
Total Protein: 6.1 g/dL (ref 6.0–8.3)

## 2023-04-13 NOTE — Progress Notes (Signed)
Subjective:  Patient ID: Brandy Gonzales, female    DOB: Jan 01, 1941  Age: 82 y.o. MRN: [id number]  CC: Medical Management of Chronic Issues (2 month follow and ER follow up and would like a dexcom prescribed , want a referral for eye doctor and dentist )   HPI Brandy Gonzales presents for epilepsy - new, Alzheimer's  Per hx: "Admit date: 04/01/2023 Discharge date: 04/04/2023   Time spent: 40 minutes   Recommendations for Outpatient Follow-up:  Follow outpatient CBC/CMP  Follow with neurology outpatient - attention to keppra dosing with renal function Follow kidney function outpatient     Discharge Diagnoses:  Principal Problem:   Seizure [facility]) Active Problems:   HTN (hypertension)   History of insertion of IVC (inferior vena caval) filter 2006   GERD   Glaucoma   OAB (overactive bladder)   Stage 3b chronic kidney disease (CKD) (HCC)   Dyslipidemia   History of DVT (deep vein thrombosis)   Anemia   Type 2 diabetes mellitus with diabetic chronic kidney disease (HCC)   Alzheimer's dementia (HCC)     Discharge Condition: stable   Diet recommendation: heart healthy, diabetic      Filed Weights    04/01/23 1249  Weight: 48.4 kg      History of present illness:    Brandy Gonzales is a 82 y.o. female with medical history significant of dementia, hypertension, hyperlipidemia, GERD, CKD 3B, anemia, DVT, glaucoma, OAB presenting with altered mental status as a code stroke.    Found to have status epilpeticus.    Now on keppra.  Neurology was consulted.  Stable for discharge 11/24.  See below for additional details.   Hospital Course:  Assessment and Plan:   Seizures Status Epilipeticus Altered mental status R/O CVA > Patient arrived as a code stroke with altered mental status and reported leftward gaze with left-sided shaking. > Met by neurology in the ED with suspicion for focal seizure and so received Ativan on await imaging. > Imaging included CT head (no acute  findings), CTA head and neck (no emergent finding including LVO, mild for age atherosclerosis, no flow reducing stenosis or irregularity of major arteries in the head and neck), CT venogram without acute abnormality. EEG with electrographic status epilepticus arising from right temporoparietal region.  As anti seizure medicines adjusted, status epilepticus resolved on 11/21 at 1411.  Subsequent EEG suggestive of moderate diffuse encephalopathy.   EEG 11/24 with evidence of epileptogenicity arising from right temporoparietal region - EEG pattern on the ictal interictal region with increased risk of seizure recurrence  MRI brain without acute findings - advanced chronic small vessel ischemic changes of the pons, cerebellum, thalmi, basal ganglia, and cerebral hemispheric white matter.  Old cortical infactions int hte left frontal lobe and right parietal lobe.  Chronic foci of hemosiderin deposition/susceptibility artifact in the parieto occipital junction regions on both sides.  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 Keppra 750 mg BID (higher than recommended for renal function, will continue after discussion with neurology) Appreciate neurology assistance - recommending keppra 750 mg BID and neurology follow up outpatient   Per [facility] statutes, patients with seizures are not allowed to drive until  they have been seizure-free for six months. Use caution when using heavy equipment or power tools. Avoid working on ladders or at heights. Take showers instead of baths. Ensure the water temperature is not too high on the home water heater. Do not go swimming  alone. When caring for infants or small children, sit down when holding, feeding, or changing them to minimize risk of injury to the child in the event you have a seizure. Also, Maintain good sleep hygiene. Avoid alcohol.   E. Coli UTI Dysuria and UA concerning for UTI.  Discharge with duricef.     Urinary  Retention resolved   Hypertension Resume coreg   Hyperlipidemia Not on meds   GERD Continue PPI when tolerating p.o.   CKD 3B Creatinine fluctuating - this actually appears more consistent with historic baseline creatinine Monitor outpatient    History of DVT Not on anticoagulation at home IVC filter in place.   Diabetes A1c 01/2023, 6 Resume home amaryl at discharge"  Outpatient Medications Prior to Visit  Medication Sig Dispense Refill   aspirin EC 81 MG tablet Take 81 mg by mouth daily. Swallow whole.     carvedilol (COREG) 25 MG tablet TAKE 1 TABLET(25 MG) BY MOUTH TWICE DAILY WITH A MEAL (Patient taking differently: Take 25 mg by mouth daily.) 180 tablet 1   glimepiride (AMARYL) 2 MG tablet Take 0.5 tablets (1 mg total) by mouth daily before breakfast. 90 tablet 1   Glucagon (GVOKE HYPOPEN 1-PACK) 1 MG/0.2ML SOAJ Inject 0.2 mLs into the skin daily as needed. 0.4 mL 3   levETIRAcetam (KEPPRA) 750 MG tablet Take 1 tablet (750 mg total) by mouth 2 (two) times daily. 60 tablet 1   omeprazole (PRILOSEC) 20 MG capsule TAKE 1 CAPSULE(20 MG) BY MOUTH DAILY 90 capsule 3   acetaminophen (TYLENOL) 500 MG tablet Take 2 tablets (1,000 mg total) by mouth every 8 (eight) hours as needed for mild pain, moderate pain, fever or headache. (Patient not taking: Reported on 04/25/2023) 30 tablet 0   No facility-administered medications prior to visit.    ROS: Review of Systems  Constitutional:  Positive for fatigue. Negative for activity change, appetite change, chills and unexpected weight change.  HENT:  Negative for congestion, mouth sores and sinus pressure.   Eyes:  Negative for visual disturbance.  Respiratory:  Negative for cough and chest tightness.   Gastrointestinal:  Negative for abdominal pain and nausea.  Genitourinary:  Negative for difficulty urinating, frequency and vaginal pain.  Musculoskeletal:  Negative for back pain and gait problem.  Skin:  Negative for pallor and  rash.  Neurological:  Negative for dizziness, tremors, weakness, numbness and headaches.  Psychiatric/Behavioral:  Positive for confusion and decreased concentration. Negative for sleep disturbance and suicidal ideas. The patient is not nervous/anxious.     Objective:  BP 124/78 (BP Location: Right Arm, Patient Position: Sitting, Cuff Size: Normal)   Pulse 60   Temp 97.9 F (36.6 C) (Oral)   Ht 4\' 11"  (1.499 m)   SpO2 95%   BMI 21.55 kg/m   BP Readings from Last 3 Encounters:  05/03/23 (!) 146/69  04/28/23 (!) 152/79  04/13/23 124/78    Wt Readings from Last 3 Encounters:  05/03/23 107 lb 12.9 oz (48.9 kg)  04/28/23 107 lb 12.9 oz (48.9 kg)  04/01/23 106 lb 11.2 oz (48.4 kg)    Physical Exam Constitutional:      General: She is not in acute distress.    Appearance: Normal appearance. She is well-developed.  HENT:     Head: Normocephalic.     Right Ear: External ear normal.     Left Ear: External ear normal.     Nose: Nose normal.  Eyes:     General:  Right eye: No discharge.        Left eye: No discharge.     Conjunctiva/sclera: Conjunctivae normal.     Pupils: Pupils are equal, round, and reactive to light.  Neck:     Thyroid: No thyromegaly.     Vascular: No JVD.     Trachea: No tracheal deviation.  Cardiovascular:     Rate and Rhythm: Normal rate and regular rhythm.     Heart sounds: Normal heart sounds.  Pulmonary:     Effort: No respiratory distress.     Breath sounds: No stridor. No wheezing.  Abdominal:     General: Bowel sounds are normal. There is no distension.     Palpations: Abdomen is soft. There is no mass.     Tenderness: There is no abdominal tenderness. There is no guarding or rebound.  Musculoskeletal:        General: No tenderness.     Cervical back: Normal range of motion and neck supple. No rigidity.     Right lower leg: No edema.     Left lower leg: No edema.  Lymphadenopathy:     Cervical: No cervical adenopathy.  Skin:     Findings: No erythema or rash.  Neurological:     Cranial Nerves: No cranial nerve deficit.     Motor: No abnormal muscle tone.     Coordination: Coordination normal.     Deep Tendon Reflexes: Reflexes normal.  Psychiatric:        Behavior: Behavior normal.        Thought Content: Thought content normal.   In a wheelchair  Lab Results  Component Value Date   WBC 6.8 05/03/2023   HGB 12.1 05/03/2023   HCT 36.0 05/03/2023   PLT 201 05/03/2023   GLUCOSE 251 (H) 05/03/2023   CHOL 140 10/21/2021   TRIG 157.0 (H) 10/21/2021   HDL 37.00 (L) 10/21/2021   LDLDIRECT 78.9 10/22/2008   LDLCALC 72 10/21/2021   ALT 34 04/26/2023   AST 32 04/26/2023   NA 138 05/03/2023   K 4.4 05/03/2023   CL 105 05/03/2023   CREATININE 1.27 (H) 05/03/2023   BUN 22 05/03/2023   CO2 26 05/03/2023   TSH 4.03 04/13/2023   INR 1.1 04/01/2023   HGBA1C 5.8 04/13/2023   MICROALBUR <0.7 12/12/2020    Overnight EEG with video  Result Date: 04/02/2023 Charlsie Quest, MD     04/02/2023  6:26 PM Patient Name: Brandy Gonzales MRN: [id number] Epilepsy Attending: Charlsie Quest Referring Physician/Provider: Erick Blinks, MD Duration: 04/01/2023 1403 to 04/02/2023 1403 Patient history: 82 yo F brought in with forced L gaze, L arm and L leg rhythmic twitching. EEG to evaluate for seizure Level of alertness: Awake, asleep AEDs during EEG study: LEV Technical aspects: This EEG study was done with scalp electrodes positioned according to the 10-20 International system of electrode placement. Electrical activity was reviewed with band pass filter of 1-70Hz , sensitivity of 7 uV/mm, display speed of 6mm/sec with a 60Hz  notched filter applied as appropriate. EEG data were recorded continuously and digitally stored.  Video monitoring was available and reviewed as appropriate. Description: At the beginning of the study, EEG showed sharply contoured 5 to 6 Hz theta slowing in right hemisphere, maximal right temporoparietal  region admixed with sharp waves.  Gradually EEG evolved into 2 to 3 Hz delta slowing.  No clinical signs were noted on video.  This EEG pattern was consistent with focal electrographic status epilepticus.  As antiseizure medications were adjusted, status epilepticus resolved on 04/01/2023 at 1411. Subsequently EEG showed continuous generalized 3-7hz  theta-delta slowing.  Gradually EEG improved and showed posterior dominant rhythm of 6 Hz activity of moderate voltage (25-35 uV) seen predominantly in posterior head regions, symmetric and reactive to eye opening and eye closing. Sleep was characterized by vertex waves, sleep spindles (12 to 14 Hz), maximal frontocentral region. Hyperventilation and photic stimulation were not performed.   ABNORMALITY -Electrographic status epilepticus, right temporoparietal region - Continuous slow, generalized IMPRESSION: At the beginning of the study, EEG was consistent with electrographic status epilepticus arising from right temporoparietal region.  As anti-seizure medications were adjusted, status epilepticus resolved on 04/01/2023 at 1411.  Subsequently EEG was suggestive of moderate diffuse encephalopathy. Charlsie Quest   CT VENOGRAM HEAD  Result Date: 04/01/2023 CLINICAL DATA:  Code stroke.  Subcortical high density by head CT. EXAM: CT VENOGRAM HEAD TECHNIQUE: Venographic phase images of the brain were obtained following the administration of intravenous contrast. Multiplanar reformats and maximum intensity projections were generated. RADIATION DOSE REDUCTION: This exam was performed according to the departmental dose-optimization program which includes automated exposure control, adjustment of the mA and/or kV according to patient size and/or use of iterative reconstruction technique. CONTRAST:  75mL OMNIPAQUE IOHEXOL 350 MG/ML SOLN COMPARISON:  Preceding head CT. FINDINGS: The dural venous sinuses are diffusely patent no stricture or diverticulum seen. No evidence of  central or cortical venous thrombosis. The subcortical high density in the right parietal region is again noted without convincing adjacent enhancement. No abnormal parenchymal enhancement seen throughout the brain. IMPRESSION: No dural venous sinus thrombosis or other acute venous finding. Electronically Signed   By: Tiburcio Pea M.D.   On: 04/01/2023 13:10   CT ANGIO HEAD NECK W WO CM (CODE STROKE)  Result Date: 04/01/2023 CLINICAL DATA:  Assess intracranial arteries. EXAM: CT ANGIOGRAPHY HEAD AND NECK WITH AND WITHOUT CONTRAST TECHNIQUE: Multidetector CT imaging of the head and neck was performed using the standard protocol during bolus administration of intravenous contrast. Multiplanar CT image reconstructions and MIPs were obtained to evaluate the vascular anatomy. Carotid stenosis measurements (when applicable) are obtained utilizing NASCET criteria, using the distal internal carotid diameter as the denominator. RADIATION DOSE REDUCTION: This exam was performed according to the departmental dose-optimization program which includes automated exposure control, adjustment of the mA and/or kV according to patient size and/or use of iterative reconstruction technique. CONTRAST:  75 cc Omnipaque 350 intravenous COMPARISON:  Head CT from earlier today. CT of the head neck 04/27/2022 FINDINGS: CTA NECK FINDINGS Aortic arch: Atheromatous calcification with 3 vessel branching. Right carotid system: Atheromatous plaque at the proximal ICA is mild. No flow reducing stenosis Left carotid system: Vessels are smoothly contoured and widely patent with less than typical atheromatous change Vertebral arteries: No proximal subclavian or vertebral stenosis. No beading or dissection. Skeleton: Negative. Other neck: Negative. Upper chest: Negative Review of the MIP images confirms the above findings CTA HEAD FINDINGS Anterior circulation: No significant stenosis, proximal occlusion, aneurysm, or vascular malformation.  Atheromatous calcification of the carotid siphons. Posterior circulation: The vertebral and basilar arteries are smoothly contoured and diffusely patent. Atheromatous irregularity of the posterior cerebral arteries asymmetric to the left but mild. No proximal flow reducing stenosis. Venous sinuses: Unremarkable Anatomic variants: Unremarkable Review of the MIP images confirms the above findings IMPRESSION: 1. No emergent finding including large vessel occlusion. 2. Mild for age atherosclerosis. No flow reducing stenosis or irregularity of major arteries in the  head and neck. Electronically Signed   By: Tiburcio Pea M.D.   On: 04/01/2023 13:07   CT HEAD CODE STROKE WO CONTRAST  Result Date: 04/01/2023 CLINICAL DATA:  Code stroke.  Unresponsive EXAM: CT HEAD WITHOUT CONTRAST TECHNIQUE: Contiguous axial images were obtained from the base of the skull through the vertex without intravenous contrast. RADIATION DOSE REDUCTION: This exam was performed according to the departmental dose-optimization program which includes automated exposure control, adjustment of the mA and/or kV according to patient size and/or use of iterative reconstruction technique. COMPARISON:  04/27/2022 brain MRI FINDINGS: Brain: No evidence of acute infarction, hemorrhage, hydrocephalus, extra-axial collection or mass lesion/mass effect. Chronic small vessel ischemic gliosis in the cerebral white matter that is confluent. Small chronic left cerebellar infarct also seen on prior brain MRI. There is cerebral volume loss with ventriculomegaly. High-density in the subcortical right parietal lobe measuring 6 mm, stable based on prior reformats and with T1 hyperintensity based on prior brain MRI, possible cavernoma. Vascular: No hyperdense vessel or unexpected calcification. Skull: Normal. Negative for fracture or focal lesion. Sinuses/Orbits: No acute finding. Other: Prelim sent in epic chat. ASPECTS [facility] Stroke Program Early CT Score) Not  scored with this history IMPRESSION: 1. No acute finding. 2. Extensive chronic small vessel disease. 3. Chronic mineralization along the right parietal cortex, possible cavernoma. Electronically Signed   By: Tiburcio Pea M.D.   On: 04/01/2023 12:50    Assessment & Plan:   Problem List Items Addressed This Visit     HTN (hypertension)   Relevant Orders   Urinalysis   Diabetic nephropathy (HCC) - Primary   Stage 3b chronic kidney disease (CKD) (HCC)   Relevant Orders   CBC with Differential/Platelet (Completed)   TSH (Completed)   Hemoglobin A1c (Completed)   Comprehensive metabolic panel (Completed)   Type 2 diabetes mellitus with diabetic chronic kidney disease (HCC)   Relevant Orders   Hemoglobin A1c   TSH   Urinalysis   Alzheimer's dementia (HCC)   Seizure disorder (HCC)   Other Visit Diagnoses       Uncontrolled type 2 diabetes mellitus with hyperglycemia, without long-term current use of insulin (HCC)  (Chronic)      Relevant Orders   CBC with Differential/Platelet (Completed)   TSH (Completed)   Hemoglobin A1c (Completed)   Comprehensive metabolic panel (Completed)   Urinalysis         No orders of the defined types were placed in this encounter.     Follow-up: Return in about 3 months (around 07/12/2023) for a follow-up visit.  Sonda Primes, MD

## 2023-04-13 NOTE — Telephone Encounter (Signed)
Okay.  Thanks.

## 2023-04-13 NOTE — Telephone Encounter (Signed)
Called and gave verbals.

## 2023-04-14 LAB — TSH: TSH: 4.03 u[IU]/mL (ref 0.35–5.50)

## 2023-04-14 LAB — HEMOGLOBIN A1C: Hgb A1c MFr Bld: 5.8 % (ref 4.6–6.5)

## 2023-04-23 DIAGNOSIS — N390 Urinary tract infection, site not specified: Secondary | ICD-10-CM | POA: Diagnosis not present

## 2023-04-23 DIAGNOSIS — G9341 Metabolic encephalopathy: Secondary | ICD-10-CM | POA: Diagnosis not present

## 2023-04-23 DIAGNOSIS — B9620 Unspecified Escherichia coli [E. coli] as the cause of diseases classified elsewhere: Secondary | ICD-10-CM | POA: Diagnosis not present

## 2023-04-23 DIAGNOSIS — G40901 Epilepsy, unspecified, not intractable, with status epilepticus: Secondary | ICD-10-CM | POA: Diagnosis not present

## 2023-04-23 DIAGNOSIS — E1165 Type 2 diabetes mellitus with hyperglycemia: Secondary | ICD-10-CM | POA: Diagnosis not present

## 2023-04-23 DIAGNOSIS — I130 Hypertensive heart and chronic kidney disease with heart failure and stage 1 through stage 4 chronic kidney disease, or unspecified chronic kidney disease: Secondary | ICD-10-CM | POA: Diagnosis not present

## 2023-04-25 ENCOUNTER — Other Ambulatory Visit: Payer: Self-pay

## 2023-04-25 ENCOUNTER — Emergency Department (HOSPITAL_COMMUNITY): Payer: Medicare Other

## 2023-04-25 ENCOUNTER — Inpatient Hospital Stay (HOSPITAL_COMMUNITY)
Admission: EM | Admit: 2023-04-25 | Discharge: 2023-04-28 | DRG: 690 | Disposition: A | Discharge status: Home Health | Payer: Medicare Other | Attending: Internal Medicine | Admitting: Internal Medicine

## 2023-04-25 ENCOUNTER — Encounter (HOSPITAL_COMMUNITY): Payer: Self-pay

## 2023-04-25 DIAGNOSIS — I6782 Cerebral ischemia: Secondary | ICD-10-CM | POA: Diagnosis not present

## 2023-04-25 DIAGNOSIS — L89326 Pressure-induced deep tissue damage of left buttock: Secondary | ICD-10-CM | POA: Diagnosis present

## 2023-04-25 DIAGNOSIS — Z90710 Acquired absence of both cervix and uterus: Secondary | ICD-10-CM

## 2023-04-25 DIAGNOSIS — E1165 Type 2 diabetes mellitus with hyperglycemia: Secondary | ICD-10-CM | POA: Diagnosis not present

## 2023-04-25 DIAGNOSIS — E1122 Type 2 diabetes mellitus with diabetic chronic kidney disease: Secondary | ICD-10-CM | POA: Diagnosis not present

## 2023-04-25 DIAGNOSIS — N12 Tubulo-interstitial nephritis, not specified as acute or chronic: Principal | ICD-10-CM | POA: Diagnosis present

## 2023-04-25 DIAGNOSIS — F02B18 Dementia in other diseases classified elsewhere, moderate, with other behavioral disturbance: Secondary | ICD-10-CM | POA: Diagnosis not present

## 2023-04-25 DIAGNOSIS — Z8049 Family history of malignant neoplasm of other genital organs: Secondary | ICD-10-CM

## 2023-04-25 DIAGNOSIS — R4182 Altered mental status, unspecified: Secondary | ICD-10-CM | POA: Diagnosis not present

## 2023-04-25 DIAGNOSIS — R531 Weakness: Secondary | ICD-10-CM | POA: Diagnosis not present

## 2023-04-25 DIAGNOSIS — D631 Anemia in chronic kidney disease: Secondary | ICD-10-CM | POA: Diagnosis present

## 2023-04-25 DIAGNOSIS — Z7984 Long term (current) use of oral hypoglycemic drugs: Secondary | ICD-10-CM | POA: Diagnosis not present

## 2023-04-25 DIAGNOSIS — Z87440 Personal history of urinary (tract) infections: Secondary | ICD-10-CM

## 2023-04-25 DIAGNOSIS — Z833 Family history of diabetes mellitus: Secondary | ICD-10-CM

## 2023-04-25 DIAGNOSIS — G309 Alzheimer's disease, unspecified: Secondary | ICD-10-CM | POA: Diagnosis not present

## 2023-04-25 DIAGNOSIS — L89316 Pressure-induced deep tissue damage of right buttock: Secondary | ICD-10-CM | POA: Diagnosis present

## 2023-04-25 DIAGNOSIS — Z841 Family history of disorders of kidney and ureter: Secondary | ICD-10-CM

## 2023-04-25 DIAGNOSIS — N390 Urinary tract infection, site not specified: Secondary | ICD-10-CM | POA: Diagnosis not present

## 2023-04-25 DIAGNOSIS — R569 Unspecified convulsions: Secondary | ICD-10-CM

## 2023-04-25 DIAGNOSIS — J9811 Atelectasis: Secondary | ICD-10-CM | POA: Diagnosis not present

## 2023-04-25 DIAGNOSIS — K219 Gastro-esophageal reflux disease without esophagitis: Secondary | ICD-10-CM | POA: Diagnosis not present

## 2023-04-25 DIAGNOSIS — Z800 Family history of malignant neoplasm of digestive organs: Secondary | ICD-10-CM

## 2023-04-25 DIAGNOSIS — F0280 Dementia in other diseases classified elsewhere without behavioral disturbance: Secondary | ICD-10-CM | POA: Diagnosis present

## 2023-04-25 DIAGNOSIS — Z95828 Presence of other vascular implants and grafts: Secondary | ICD-10-CM | POA: Diagnosis not present

## 2023-04-25 DIAGNOSIS — Z8042 Family history of malignant neoplasm of prostate: Secondary | ICD-10-CM

## 2023-04-25 DIAGNOSIS — R0789 Other chest pain: Secondary | ICD-10-CM | POA: Diagnosis not present

## 2023-04-25 DIAGNOSIS — I10 Essential (primary) hypertension: Secondary | ICD-10-CM | POA: Diagnosis not present

## 2023-04-25 DIAGNOSIS — K828 Other specified diseases of gallbladder: Secondary | ICD-10-CM | POA: Diagnosis not present

## 2023-04-25 DIAGNOSIS — G301 Alzheimer's disease with late onset: Secondary | ICD-10-CM | POA: Diagnosis not present

## 2023-04-25 DIAGNOSIS — Z7982 Long term (current) use of aspirin: Secondary | ICD-10-CM

## 2023-04-25 DIAGNOSIS — N10 Acute tubulo-interstitial nephritis: Principal | ICD-10-CM | POA: Diagnosis present

## 2023-04-25 DIAGNOSIS — Z86718 Personal history of other venous thrombosis and embolism: Secondary | ICD-10-CM

## 2023-04-25 DIAGNOSIS — R5381 Other malaise: Secondary | ICD-10-CM

## 2023-04-25 DIAGNOSIS — N1832 Chronic kidney disease, stage 3b: Secondary | ICD-10-CM | POA: Diagnosis present

## 2023-04-25 DIAGNOSIS — I509 Heart failure, unspecified: Secondary | ICD-10-CM | POA: Diagnosis not present

## 2023-04-25 DIAGNOSIS — E785 Hyperlipidemia, unspecified: Secondary | ICD-10-CM | POA: Diagnosis present

## 2023-04-25 DIAGNOSIS — G9341 Metabolic encephalopathy: Secondary | ICD-10-CM | POA: Diagnosis not present

## 2023-04-25 DIAGNOSIS — R609 Edema, unspecified: Secondary | ICD-10-CM | POA: Diagnosis not present

## 2023-04-25 DIAGNOSIS — Z79899 Other long term (current) drug therapy: Secondary | ICD-10-CM | POA: Diagnosis not present

## 2023-04-25 DIAGNOSIS — I130 Hypertensive heart and chronic kidney disease with heart failure and stage 1 through stage 4 chronic kidney disease, or unspecified chronic kidney disease: Secondary | ICD-10-CM | POA: Diagnosis not present

## 2023-04-25 DIAGNOSIS — K5900 Constipation, unspecified: Secondary | ICD-10-CM | POA: Diagnosis not present

## 2023-04-25 DIAGNOSIS — I129 Hypertensive chronic kidney disease with stage 1 through stage 4 chronic kidney disease, or unspecified chronic kidney disease: Secondary | ICD-10-CM | POA: Diagnosis present

## 2023-04-25 DIAGNOSIS — Z803 Family history of malignant neoplasm of breast: Secondary | ICD-10-CM | POA: Diagnosis not present

## 2023-04-25 DIAGNOSIS — Z8249 Family history of ischemic heart disease and other diseases of the circulatory system: Secondary | ICD-10-CM

## 2023-04-25 DIAGNOSIS — R109 Unspecified abdominal pain: Secondary | ICD-10-CM | POA: Diagnosis present

## 2023-04-25 DIAGNOSIS — G40901 Epilepsy, unspecified, not intractable, with status epilepticus: Secondary | ICD-10-CM | POA: Diagnosis not present

## 2023-04-25 DIAGNOSIS — B9620 Unspecified Escherichia coli [E. coli] as the cause of diseases classified elsewhere: Secondary | ICD-10-CM | POA: Diagnosis not present

## 2023-04-25 DIAGNOSIS — Z7401 Bed confinement status: Secondary | ICD-10-CM | POA: Diagnosis not present

## 2023-04-25 DIAGNOSIS — R601 Generalized edema: Secondary | ICD-10-CM | POA: Diagnosis not present

## 2023-04-25 DIAGNOSIS — G40909 Epilepsy, unspecified, not intractable, without status epilepticus: Secondary | ICD-10-CM | POA: Diagnosis present

## 2023-04-25 DIAGNOSIS — Q272 Other congenital malformations of renal artery: Secondary | ICD-10-CM | POA: Diagnosis not present

## 2023-04-25 DIAGNOSIS — E1140 Type 2 diabetes mellitus with diabetic neuropathy, unspecified: Secondary | ICD-10-CM | POA: Diagnosis not present

## 2023-04-25 LAB — COMPREHENSIVE METABOLIC PANEL
ALT: 35 U/L (ref 0–44)
AST: 34 U/L (ref 15–41)
Albumin: 3 g/dL — ABNORMAL LOW (ref 3.5–5.0)
Alkaline Phosphatase: 157 U/L — ABNORMAL HIGH (ref 38–126)
Anion gap: 10 (ref 5–15)
BUN: 25 mg/dL — ABNORMAL HIGH (ref 8–23)
CO2: 22 mmol/L (ref 22–32)
Calcium: 9.9 mg/dL (ref 8.9–10.3)
Chloride: 113 mmol/L — ABNORMAL HIGH (ref 98–111)
Creatinine, Ser: 1.21 mg/dL — ABNORMAL HIGH (ref 0.44–1.00)
GFR, Estimated: 44 mL/min — ABNORMAL LOW (ref >60)
Glucose, Bld: 174 mg/dL — ABNORMAL HIGH (ref 70–99)
Potassium: 4.1 mmol/L (ref 3.5–5.1)
Sodium: 145 mmol/L (ref 135–145)
Total Bilirubin: 1.1 mg/dL (ref <1.2)
Total Protein: 5.5 g/dL — ABNORMAL LOW (ref 6.5–8.1)

## 2023-04-25 LAB — CBC WITH DIFFERENTIAL/PLATELET
Abs Immature Granulocytes: 0.02 10*3/uL (ref 0.00–0.07)
Basophils Absolute: 0 10*3/uL (ref 0.0–0.1)
Basophils Relative: 1 %
Eosinophils Absolute: 0.3 10*3/uL (ref 0.0–0.5)
Eosinophils Relative: 4 %
HCT: 35.2 % — ABNORMAL LOW (ref 36.0–46.0)
Hemoglobin: 11.8 g/dL — ABNORMAL LOW (ref 12.0–15.0)
Immature Granulocytes: 0 %
Lymphocytes Relative: 21 %
Lymphs Abs: 1.5 10*3/uL (ref 0.7–4.0)
MCH: 31.6 pg (ref 26.0–34.0)
MCHC: 33.5 g/dL (ref 30.0–36.0)
MCV: 94.1 fL (ref 80.0–100.0)
Monocytes Absolute: 0.6 10*3/uL (ref 0.1–1.0)
Monocytes Relative: 8 %
Neutro Abs: 4.9 10*3/uL (ref 1.7–7.7)
Neutrophils Relative %: 66 %
Platelets: 197 10*3/uL (ref 150–400)
RBC: 3.74 MIL/uL — ABNORMAL LOW (ref 3.87–5.11)
RDW: 13.2 % (ref 11.5–15.5)
WBC: 7.4 10*3/uL (ref 4.0–10.5)
nRBC: 0 % (ref 0.0–0.2)

## 2023-04-25 LAB — SALICYLATE LEVEL: Salicylate Lvl: <7 mg/dL — ABNORMAL LOW (ref 7.0–30.0)

## 2023-04-25 LAB — AMMONIA: Ammonia: 17 umol/L (ref 9–35)

## 2023-04-25 LAB — URINALYSIS, W/ REFLEX TO CULTURE (INFECTION SUSPECTED)
Bilirubin Urine: NEGATIVE
Glucose, UA: NEGATIVE mg/dL
Ketones, ur: NEGATIVE mg/dL
Nitrite: NEGATIVE
Protein, ur: 30 mg/dL — AB
RBC / HPF: >50 RBC/hpf (ref 0–5)
Specific Gravity, Urine: 1.025 (ref 1.005–1.030)
WBC, UA: >50 WBC/hpf (ref 0–5)
pH: 6 (ref 5.0–8.0)

## 2023-04-25 LAB — TROPONIN I (HIGH SENSITIVITY)
Troponin I (High Sensitivity): 11 ng/L (ref <18)
Troponin I (High Sensitivity): 13 ng/L (ref <18)

## 2023-04-25 LAB — MAGNESIUM: Magnesium: 1.8 mg/dL (ref 1.7–2.4)

## 2023-04-25 LAB — ETHANOL: Alcohol, Ethyl (B): <10 mg/dL (ref <10)

## 2023-04-25 LAB — ACETAMINOPHEN LEVEL: Acetaminophen (Tylenol), Serum: <10 ug/mL — ABNORMAL LOW (ref 10–30)

## 2023-04-25 LAB — CBG MONITORING, ED: Glucose-Capillary: 150 mg/dL — ABNORMAL HIGH (ref 70–99)

## 2023-04-25 MED ORDER — INSULIN ASPART 100 UNIT/ML IJ SOLN: 0.0000 [IU] | Freq: Every day | INTRAMUSCULAR | Status: DC

## 2023-04-25 MED ORDER — LIDOCAINE 5 % EX PTCH
1.0000 | MEDICATED_PATCH | CUTANEOUS | Status: DC
  Administered 2023-04-26 – 2023-04-27 (×2): 1 via TRANSDERMAL
  Filled 2023-04-25 (×3): qty 1

## 2023-04-25 MED ORDER — INSULIN ASPART 100 UNIT/ML IJ SOLN
0.0000 [IU] | Freq: Three times a day (TID) | INTRAMUSCULAR | Status: DC
  Administered 2023-04-26 (×2): 3 [IU] via SUBCUTANEOUS
  Administered 2023-04-26 – 2023-04-27 (×2): 1 [IU] via SUBCUTANEOUS
  Administered 2023-04-27: 2 [IU] via SUBCUTANEOUS
  Administered 2023-04-27: 3 [IU] via SUBCUTANEOUS
  Administered 2023-04-28: 2 [IU] via SUBCUTANEOUS
  Administered 2023-04-28: 1 [IU] via SUBCUTANEOUS

## 2023-04-25 MED ORDER — IOHEXOL 350 MG/ML SOLN
75.0000 mL | Freq: Once | INTRAVENOUS | Status: AC | PRN
  Administered 2023-04-25: 75 mL via INTRAVENOUS

## 2023-04-25 MED ORDER — LACTATED RINGERS IV SOLN: INTRAVENOUS | Status: AC

## 2023-04-25 MED ORDER — POLYETHYLENE GLYCOL 3350 17 G PO PACK: 17.0000 g | PACK | Freq: Every day | ORAL | Status: DC | PRN

## 2023-04-25 MED ORDER — PROCHLORPERAZINE EDISYLATE 10 MG/2ML IJ SOLN: 5.0000 mg | Freq: Four times a day (QID) | INTRAMUSCULAR | Status: DC | PRN

## 2023-04-25 MED ORDER — CARVEDILOL 12.5 MG PO TABS
25.0000 mg | ORAL_TABLET | Freq: Once | ORAL | Status: AC
  Administered 2023-04-25: 25 mg via ORAL
  Filled 2023-04-25: qty 2

## 2023-04-25 MED ORDER — PANTOPRAZOLE SODIUM 40 MG PO TBEC
40.0000 mg | DELAYED_RELEASE_TABLET | Freq: Every day | ORAL | Status: DC
  Administered 2023-04-26 – 2023-04-28 (×3): 40 mg via ORAL
  Filled 2023-04-25 (×3): qty 1

## 2023-04-25 MED ORDER — OXYCODONE HCL 5 MG PO TABS: 5.0000 mg | ORAL_TABLET | Freq: Four times a day (QID) | ORAL | Status: DC | PRN

## 2023-04-25 MED ORDER — CARVEDILOL 12.5 MG PO TABS: 25.0000 mg | ORAL_TABLET | Freq: Once | ORAL | Status: DC

## 2023-04-25 MED ORDER — ENOXAPARIN SODIUM 30 MG/0.3ML IJ SOSY
30.0000 mg | PREFILLED_SYRINGE | INTRAMUSCULAR | Status: DC
  Administered 2023-04-26 – 2023-04-28 (×3): 30 mg via SUBCUTANEOUS
  Filled 2023-04-25 (×3): qty 0.3

## 2023-04-25 MED ORDER — HYDRALAZINE HCL 20 MG/ML IJ SOLN: 5.0000 mg | Freq: Once | INTRAMUSCULAR | Status: DC

## 2023-04-25 MED ORDER — ACETAMINOPHEN 325 MG PO TABS: 650.0000 mg | ORAL_TABLET | Freq: Once | ORAL | Status: DC

## 2023-04-25 MED ORDER — LEVETIRACETAM 750 MG PO TABS
750.0000 mg | ORAL_TABLET | Freq: Two times a day (BID) | ORAL | Status: DC
  Administered 2023-04-26 – 2023-04-28 (×5): 750 mg via ORAL
  Filled 2023-04-25 (×5): qty 1

## 2023-04-25 MED ORDER — SODIUM CHLORIDE 0.9 % IV SOLN
1.0000 g | Freq: Once | INTRAVENOUS | Status: AC
  Administered 2023-04-25: 1 g via INTRAVENOUS
  Filled 2023-04-25: qty 10

## 2023-04-25 MED ORDER — ACETAMINOPHEN 325 MG PO TABS: 650.0000 mg | ORAL_TABLET | Freq: Four times a day (QID) | ORAL | Status: DC | PRN

## 2023-04-25 MED ORDER — ASPIRIN 81 MG PO TBEC
81.0000 mg | DELAYED_RELEASE_TABLET | Freq: Every day | ORAL | Status: DC
  Administered 2023-04-26 – 2023-04-28 (×3): 81 mg via ORAL
  Filled 2023-04-25 (×3): qty 1

## 2023-04-25 MED ORDER — MELATONIN 5 MG PO TABS
5.0000 mg | ORAL_TABLET | Freq: Every evening | ORAL | Status: DC | PRN
  Filled 2023-04-25: qty 1

## 2023-04-25 MED ORDER — LEVETIRACETAM 500 MG PO TABS
750.0000 mg | ORAL_TABLET | Freq: Once | ORAL | Status: AC
  Administered 2023-04-25: 750 mg via ORAL
  Filled 2023-04-25: qty 1
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
## 2023-04-25 NOTE — ED Notes (Signed)
Unsuccessful trip to restroom. Pt escorted to toiled with 2 staff but still had difficulty moving.   Returned to room for In-and-out catheter.

## 2023-04-25 NOTE — ED Notes (Signed)
 Central telemetry notified for admission to monitor.

## 2023-04-25 NOTE — ED Triage Notes (Signed)
Pt BIB [facility] from home d/t left side weakness and generalized body pain. Pt has dementia at baseline. Family stated LSN was 11am this morning. Hx HTN. Systolic in the 200s.   BP 201/90 HR 74  98% RA  CBG 162

## 2023-04-26 DIAGNOSIS — K5900 Constipation, unspecified: Secondary | ICD-10-CM

## 2023-04-26 DIAGNOSIS — I10 Essential (primary) hypertension: Secondary | ICD-10-CM

## 2023-04-26 DIAGNOSIS — E1122 Type 2 diabetes mellitus with diabetic chronic kidney disease: Secondary | ICD-10-CM

## 2023-04-26 DIAGNOSIS — R569 Unspecified convulsions: Secondary | ICD-10-CM

## 2023-04-26 DIAGNOSIS — N12 Tubulo-interstitial nephritis, not specified as acute or chronic: Secondary | ICD-10-CM | POA: Diagnosis not present

## 2023-04-26 DIAGNOSIS — N1832 Chronic kidney disease, stage 3b: Secondary | ICD-10-CM

## 2023-04-26 DIAGNOSIS — K219 Gastro-esophageal reflux disease without esophagitis: Secondary | ICD-10-CM

## 2023-04-26 DIAGNOSIS — R531 Weakness: Secondary | ICD-10-CM

## 2023-04-26 DIAGNOSIS — F02B18 Dementia in other diseases classified elsewhere, moderate, with other behavioral disturbance: Secondary | ICD-10-CM

## 2023-04-26 DIAGNOSIS — R5381 Other malaise: Secondary | ICD-10-CM

## 2023-04-26 DIAGNOSIS — G301 Alzheimer's disease with late onset: Secondary | ICD-10-CM

## 2023-04-26 LAB — COMPREHENSIVE METABOLIC PANEL
ALT: 34 U/L (ref 0–44)
AST: 32 U/L (ref 15–41)
Albumin: 2.7 g/dL — ABNORMAL LOW (ref 3.5–5.0)
Alkaline Phosphatase: 125 U/L (ref 38–126)
Anion gap: 4 — ABNORMAL LOW (ref 5–15)
BUN: 23 mg/dL (ref 8–23)
CO2: 22 mmol/L (ref 22–32)
Calcium: 9.1 mg/dL (ref 8.9–10.3)
Chloride: 115 mmol/L — ABNORMAL HIGH (ref 98–111)
Creatinine, Ser: 1.19 mg/dL — ABNORMAL HIGH (ref 0.44–1.00)
GFR, Estimated: 45 mL/min — ABNORMAL LOW (ref >60)
Glucose, Bld: 266 mg/dL — ABNORMAL HIGH (ref 70–99)
Potassium: 4 mmol/L (ref 3.5–5.1)
Sodium: 141 mmol/L (ref 135–145)
Total Bilirubin: 0.6 mg/dL (ref <1.2)
Total Protein: 5 g/dL — ABNORMAL LOW (ref 6.5–8.1)

## 2023-04-26 LAB — GLUCOSE, CAPILLARY
Glucose-Capillary: 216 mg/dL — ABNORMAL HIGH (ref 70–99)
Glucose-Capillary: 220 mg/dL — ABNORMAL HIGH (ref 70–99)
Glucose-Capillary: 150 mg/dL — ABNORMAL HIGH (ref 70–99)
Glucose-Capillary: 143 mg/dL — ABNORMAL HIGH (ref 70–99)

## 2023-04-26 LAB — CBC
HCT: 30.2 % — ABNORMAL LOW (ref 36.0–46.0)
Hemoglobin: 10.1 g/dL — ABNORMAL LOW (ref 12.0–15.0)
MCH: 31.6 pg (ref 26.0–34.0)
MCHC: 33.4 g/dL (ref 30.0–36.0)
MCV: 94.4 fL (ref 80.0–100.0)
Platelets: 169 10*3/uL (ref 150–400)
RBC: 3.2 MIL/uL — ABNORMAL LOW (ref 3.87–5.11)
RDW: 13.2 % (ref 11.5–15.5)
WBC: 5.3 10*3/uL (ref 4.0–10.5)
nRBC: 0 % (ref 0.0–0.2)

## 2023-04-26 LAB — PHOSPHORUS: Phosphorus: 2.9 mg/dL (ref 2.5–4.6)

## 2023-04-26 LAB — MAGNESIUM: Magnesium: 1.9 mg/dL (ref 1.7–2.4)

## 2023-04-26 MED ORDER — CEFTRIAXONE SODIUM 2 G IJ SOLR
2.0000 g | INTRAMUSCULAR | Status: DC
  Administered 2023-04-26 – 2023-04-27 (×2): 2 g via INTRAVENOUS
  Filled 2023-04-26 (×2): qty 20

## 2023-04-26 MED ORDER — CARVEDILOL 25 MG PO TABS
25.0000 mg | ORAL_TABLET | Freq: Two times a day (BID) | ORAL | Status: DC
  Administered 2023-04-26 – 2023-04-28 (×5): 25 mg via ORAL
  Filled 2023-04-26 (×5): qty 1

## 2023-04-26 MED ORDER — MUSCLE RUB 10-15 % EX CREA: TOPICAL_CREAM | CUTANEOUS | Status: DC | PRN

## 2023-04-26 MED ORDER — CARVEDILOL 12.5 MG PO TABS: 25.0000 mg | ORAL_TABLET | Freq: Every day | ORAL | Status: DC

## 2023-04-26 MED ORDER — SENNOSIDES-DOCUSATE SODIUM 8.6-50 MG PO TABS
1.0000 | ORAL_TABLET | Freq: Two times a day (BID) | ORAL | Status: DC
  Administered 2023-04-27 – 2023-04-28 (×3): 1 via ORAL
  Filled 2023-04-26 (×5): qty 1

## 2023-04-26 MED ORDER — POLYETHYLENE GLYCOL 3350 17 G PO PACK
17.0000 g | PACK | Freq: Two times a day (BID) | ORAL | Status: DC
  Filled 2023-04-26: qty 1

## 2023-04-26 MED ORDER — BISACODYL 10 MG RE SUPP
10.0000 mg | Freq: Every day | RECTAL | Status: DC
  Administered 2023-04-27 – 2023-04-28 (×2): 10 mg via RECTAL
  Filled 2023-04-26 (×3): qty 1

## 2023-04-26 MED ORDER — POLYETHYLENE GLYCOL 3350 17 G PO PACK
17.0000 g | PACK | Freq: Every day | ORAL | Status: DC
  Administered 2023-04-27 – 2023-04-28 (×2): 17 g via ORAL
  Filled 2023-04-26 (×2): qty 1

## 2023-04-26 NOTE — Inpatient Diabetes Management (Signed)
Inpatient Diabetes Program Recommendations  AACE/ADA: New Consensus Statement on Inpatient Glycemic Control (2015)  Target Ranges:  Prepandial:   less than 140 mg/dL      Peak postprandial:   less than 180 mg/dL (1-2 hours)      Critically ill patients:  140 - 180 mg/dL   Lab Results  Component Value Date   GLUCAP 220 (H) 04/26/2023   HGBA1C 5.8 04/13/2023    Review of Glycemic Control  Latest Reference Range & Units 04/25/23 23:40 04/26/23 08:46 04/26/23 12:01  Glucose-Capillary 70 - 99 mg/dL 865 (H) 784 (H) 696 (H)   Diabetes history: DM 2 Outpatient Diabetes medications:  Amaryl 1 mg daily Current orders for Inpatient glycemic control:  Novolog 0-9 units tid with meals and HS  Inpatient Diabetes Program Recommendations:    If CBG's remain>180 mg/dL, consider adding Semglee 6 units daily.   Thanks,  Beryl Meager, RN, BC-ADM Inpatient Diabetes Coordinator Pager [phone]  (8a-5p)

## 2023-04-26 NOTE — Evaluation (Signed)
Physical Therapy Evaluation Patient Details Name: Brandy Gonzales MRN: [id number] DOB: 06/04/1940 Today's Date: 04/26/2023  History of Present Illness  Patient is a 82 year old female with generalized weakness, pyelonephritis. History of dementia, HTN, GERD, CKD, anemia, DVT, glaucoma. Recent admit for new onset seizures.  Clinical Impression  Patient is oriented to self and place. She reports pain with mobility, stating her leg hurts but reaching for her left flank. Her son in the room confirms that patient requires assistance with mobility and ADLs at home with DME in place already.  Today the patient required moderate assistance to sit up on edge of bed. She declined standing, reporting pain, fatigue, and requesting to return to bed. She was incontinent of bowel with total assistance required for peri care and +2 person assistance for rolling to change bed pad. Recommend PT continue to follow to maximize independence and decrease caregiver burden. Anticipate patient could return home with continued family support with HHPT recommended. If family unable to provide care at home, patient would benefit from rehabilitation < 3 hours/day at short term rehab.       If plan is discharge home, recommend the following: Two people to help with walking and/or transfers;A lot of help with bathing/dressing/bathroom;Assistance with cooking/housework;Direct supervision/assist for medications management;Direct supervision/assist for financial management;Assist for transportation;Help with stairs or ramp for entrance;Supervision due to cognitive status   Can travel by private vehicle        Equipment Recommendations None recommended by PT  Recommendations for Other Services       Functional Status Assessment Patient has had a recent decline in their functional status and demonstrates the ability to make significant improvements in function in a reasonable and predictable amount of time.     Precautions /  Restrictions Precautions Precautions: Fall Restrictions Weight Bearing Restrictions Per Provider Order: No      Mobility  Bed Mobility Overal bed mobility: Needs Assistance Bed Mobility: Supine to Sit, Sit to Supine, Rolling Rolling: Mod assist, +2 for physical assistance   Supine to sit: Mod assist Sit to supine: Mod assist   General bed mobility comments: assistance for LE and trunk support. encouragement for participation. patient had bowel incontinence and required +2 person for changing bed pad. brief was saturated with urine also.    Transfers                   General transfer comment: patient declined standing, requesting to lay down    Ambulation/Gait                  Stairs            Wheelchair Mobility     Tilt Bed    Modified Rankin (Stroke Patients Only)       Balance Overall balance assessment: Needs assistance Sitting-balance support: Bilateral upper extremity supported, Feet supported Sitting balance-Leahy Scale: Poor Sitting balance - Comments: poor progressing to fair                                     Pertinent Vitals/Pain Pain Assessment Pain Assessment: Faces Faces Pain Scale: Hurts even more Pain Location: she reports her "leg" hurts, but reaches for left flank Pain Descriptors / Indicators: Moaning, Grimacing Pain Intervention(s): Limited activity within patient's tolerance, Monitored during session, Repositioned    Home Living Family/patient expects to be discharged to:: Private residence Living Arrangements: Other relatives (  multiple people. son reports he recently moved in also to provide respite for other family members) Available Help at Discharge: Family Type of Home: House Home Access: Ramped entrance       Home Layout: One level Home Equipment: Agricultural consultant (2 wheels);Rollator (4 wheels);Standard Walker;Cane - single point;BSC/3in1;Wheelchair - manual;Hospital bed Additional  Comments: some information is from recent hospital admission    Prior Function Prior Level of Function : Needs assist (infomation if from prior hospital stay. son confirms)             Mobility Comments: per pt's daughter, she will do more for some family members and less for others; she walks into bathroom 3x/wk for shower; daughter transfers her in/out of car for appointments (uses wheelchair) ADLs Comments: pts assist level varies depending on which family members are present (some do more for her and some make her do more); able to self-feed, transfer into shower onto BSC/shower seat     Extremity/Trunk Assessment   Upper Extremity Assessment Upper Extremity Assessment: Generalized weakness    Lower Extremity Assessment Lower Extremity Assessment: Generalized weakness       Communication   Communication Communication: Hearing impairment Cueing Techniques: Verbal cues;Tactile cues  Cognition Arousal: Alert Behavior During Therapy: Flat affect Overall Cognitive Status: History of cognitive impairments - at baseline                                 General Comments: Patient is oriented to person, place. Encouragement required for participation        General Comments      Exercises     Assessment/Plan    PT Assessment Patient needs continued PT services  PT Problem List Decreased strength;Decreased activity tolerance;Decreased balance;Decreased mobility;Decreased cognition;Decreased knowledge of use of DME;Decreased safety awareness;Pain       PT Treatment Interventions DME instruction;Gait training;Functional mobility training;Therapeutic activities;Therapeutic exercise;Balance training;Neuromuscular re-education;Cognitive remediation;Patient/family education;Wheelchair mobility training    PT Goals (Current goals can be found in the Care Plan section)  Acute Rehab PT Goals Patient Stated Goal: patient unable to state. son was unsure if he wanted  home versus rehab PT Goal Formulation: With family Time For Goal Achievement: 05/10/23 Potential to Achieve Goals: Fair    Frequency Min 1X/week     Co-evaluation               AM-PAC PT "6 Clicks" Mobility  Outcome Measure Help needed turning from your back to your side while in a flat bed without using bedrails?: Total Help needed moving from lying on your back to sitting on the side of a flat bed without using bedrails?: A Lot Help needed moving to and from a bed to a chair (including a wheelchair)?: A Lot Help needed standing up from a chair using your arms (e.g., wheelchair or bedside chair)?: A Lot Help needed to walk in hospital room?: A Lot Help needed climbing 3-5 steps with a railing? : Total 6 Click Score: 10    End of Session   Activity Tolerance: Patient limited by fatigue Patient left: in bed;with call bell/phone within reach;with bed alarm set;with family/visitor present;with nursing/sitter in room (son, nurse, MD in room) Nurse Communication: Mobility status PT Visit Diagnosis: Unsteadiness on feet (R26.81);Other abnormalities of gait and mobility (R26.89);Muscle weakness (generalized) (M62.81)    Time: 1610-9604 PT Time Calculation (min) (ACUTE ONLY): 23 min   Charges:   PT Evaluation $PT Eval Low  Complexity: 1 Low   PT General Charges $$ ACUTE PT VISIT: 1 Visit         Donna Bernard, PT, MPT   Ina Homes 04/26/2023, 12:41 PM

## 2023-04-26 NOTE — Progress Notes (Signed)
PROGRESS NOTE    GAYLAN TAUSCH  EXB:[id number] DOB: [birth year] DOA: 04/25/2023 PCP: Tresa Garter, MD    Chief Complaint  Patient presents with   Weakness    Brief Narrative:  Patient pleasant 82 year old female history of dementia, hypertension, hyperlipidemia, GERD, CKD 3B, AOCD, history of DVT, glaucoma recently hospitalized for new onset seizures 04/01/2023-04/04/2023, was on Keppra presenting to the ED with progressive generalized weakness and abdominal pain x 1 day.  Workup in the ED with a urinalysis concerning for UTI, CT angiogram chest abdomen and pelvis with no evidence of acute aortic dissection or aneurysm, no significant stenosis, no evidence of acute occlusive disease, cortical hypoenhancement involving left greater than right kidney finding could be secondary to pyelonephritis though infarct is also included in differential given appearance of superior pole of the left kidney.  Slightly thickened wall appearance of the urinary bladder correlate with urinalysis.  Also concerns for constipation with moderate rectal distention by feces.  Patient placed empirically on IV Rocephin.   Assessment & Plan:   Principal Problem:   Pyelonephritis Active Problems:   HTN (hypertension)   GERD   Stage 3b chronic kidney disease (CKD) (HCC)   Type 2 diabetes mellitus with diabetic chronic kidney disease (HCC)   Alzheimer's dementia (HCC)   Seizure (HCC)   Constipation   General weakness  #1 acute bilateral pyelonephritis L > R, POA -Patient presented with generalized weakness, abdominal pain, flank pain. -Urinalysis concerning for UTI with pyuria. -CT angiogram abdomen/chest/pelvis concerning for pyelonephritis left greater than right. -Urine cultures pending. -Check blood cultures x 2. -Change IV Rocephin to 2 g daily pending culture results. -Supportive care.  2.  Recently diagnosed seizure disorder -Patient recently diagnosed with seizure disorder during last  hospitalization 04/01/2023-04/04/2023. 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 -Continue home regimen Keppra. -Seizure precautions.  3.  Constipation -Patient noted with significant constipation on CT scan. -Dulcolax suppository daily. -Placed on MiraLAX 17 g daily, Senokot-S twice daily.  4.  CKD 3B -Baseline creatinine approximately 1.2. -Avoid nephrotoxic agents, hypotension, dehydration. -Follow.  5.  Type 2 diabetes mellitus with hyperglycemia -Hemoglobin A1c 5.8 (04/13/2023) -CBG noted at 216 this morning. -Hold home regimen oral hypoglycemic agents. -SSI.  6.  GERD -PPI.  7.  Hypertension -Resume home regimen Coreg at 25 mg twice daily. -Follow-up.  8.  Generalized weakness -PT/OT. -Treat problem #1. -Fall precautions.  9.  Dementia -Stable.   DVT prophylaxis: Lovenox Code Status: Full Family Communication: Updated patient and son at bedside. Disposition: TBD  Status is: Inpatient Remains inpatient appropriate because: Severity of illness   Consultants:  None  Procedures:  CT angiogram chest/abdomen/pelvis 04/25/2023 CT head 04/25/2023 Chest x-ray 04/25/2023   Antimicrobials:  Anti-infectives (From admission, onward)    Start     Dose/Rate Route Frequency Ordered Stop   04/26/23 2300  cefTRIAXone (ROCEPHIN) 2 g in sodium chloride 0.9 % 100 mL IVPB        2 g 200 mL/hr over 30 Minutes Intravenous Every 24 hours 04/26/23 0753     04/25/23 2300  cefTRIAXone (ROCEPHIN) 1 g in sodium chloride 0.9 % 100 mL IVPB        1 g 200 mL/hr over 30 Minutes Intravenous  Once 04/25/23 2254 04/26/23 0207  Subjective: Transferred from the ED to the room.  Getting cleaned out noted to have had a bowel movement.  Patient with complaints of  bilateral flank pain.  Denies any chest pain or shortness of breath.  Denies any significant suprapubic abdominal pain.  Does endorse urinary frequency and urgency.  Son at bedside.  Objective: Vitals:   04/26/23 0420 04/26/23 0530 04/26/23 0745 04/26/23 0847  BP:  (!) 171/71 (!) 190/77 (!) 171/70  Pulse:  64  68  Resp:  15 11 16   Temp: 98.3 F (36.8 C)   (!) 97.5 F (36.4 C)  TempSrc: Oral   Oral  SpO2:  100% 100% 100%  Weight:       No intake or output data in the 24 hours ending 04/26/23 1017 Filed Weights   04/25/23 2302  Weight: 48.4 kg    Examination:  General exam: Appears calm and comfortable  Respiratory system: Clear to auscultation. Respiratory effort normal. Cardiovascular system: S1 & S2 heard, RRR. No JVD, murmurs, rubs, gallops or clicks. No pedal edema. Gastrointestinal system: Abdomen is nondistended, soft and nontender.  Bilateral CVA TTP.  No organomegaly or masses felt. Normal bowel sounds heard. Central nervous system: Alert and oriented. No focal neurological deficits. Extremities: Symmetric 5 x 5 power. Skin: No rashes, lesions or ulcers Psychiatry: Judgement and insight appear normal. Mood & affect appropriate.     Data Reviewed: I have personally reviewed following labs and imaging studies  CBC: Recent Labs  Lab 04/25/23 1826 04/26/23 0540  WBC 7.4 5.3  NEUTROABS 4.9  --   HGB 11.8* 10.1*  HCT 35.2* 30.2*  MCV 94.1 94.4  PLT 197 169    Basic Metabolic Panel: Recent Labs  Lab 04/25/23 1826 04/26/23 0540  NA 145 141  K 4.1 4.0  CL 113* 115*  CO2 22 22  GLUCOSE 174* 266*  BUN 25* 23  CREATININE 1.21* 1.19*  CALCIUM 9.9 9.1  MG 1.8 1.9  PHOS  --  2.9    GFR: Estimated Creatinine Clearance: 24.4 mL/min (A) (by C-G formula based on SCr of 1.19 mg/dL (H)).  Liver Function Tests: Recent Labs  Lab 04/25/23 1826 04/26/23 0540  AST 34 32  ALT 35 34  ALKPHOS 157* 125  BILITOT 1.1 0.6  PROT 5.5* 5.0*  ALBUMIN 3.0* 2.7*     CBG: Recent Labs  Lab 04/25/23 2340 04/26/23 0846  GLUCAP 150* 216*     No results found for this or any previous visit (from the past 240 hours).       Radiology Studies: CT Angio Chest/Abd/Pel for Dissection W and/or Wo Contrast Result Date: 04/25/2023 CLINICAL DATA:  Dementia weakness pain EXAM: CT ANGIOGRAPHY CHEST, ABDOMEN AND PELVIS TECHNIQUE: Non-contrast CT of the chest was initially obtained. Multidetector CT imaging through the chest, abdomen and pelvis was performed using the standard protocol during bolus administration of intravenous contrast. Multiplanar reconstructed images and MIPs were obtained and reviewed to evaluate the vascular anatomy. RADIATION DOSE REDUCTION: This exam was performed according to the departmental dose-optimization program which includes automated exposure control, adjustment of the mA and/or kV according to patient size and/or use of iterative reconstruction technique. CONTRAST:  75mL OMNIPAQUE IOHEXOL 350 MG/ML SOLN COMPARISON:  Chest x-ray 04/25/2023, CT 05/18/2022 FINDINGS: CTA CHEST FINDINGS Cardiovascular: Non contrasted images of the chest demonstrate no acute intramural hematoma. Nonaneurysmal aorta. No dissection is seen. Artifact obscures part of the aortic arch. Normal cardiac size. Coronary vascular calcification. No pericardial effusion Mediastinum/Nodes: Patent trachea.  No thyroid mass. Borderline mediastinal lymph nodes. Right precarinal node measures 9 mm. Subcarinal node measures 10 mm. Multiple subcentimeter AP window nodes. Esophagus within normal limits. Lungs/Pleura: Small pleural effusions. Dependent atelectasis at the bases. No acute airspace disease or pneumothorax. Musculoskeletal: Old appearing sternal fracture. Vertebral body heights are maintained. Review of the MIP images confirms the above findings. CTA ABDOMEN AND PELVIS FINDINGS VASCULAR Aorta: Normal caliber aorta without aneurysm, dissection, vasculitis or significant  stenosis. Celiac: Patent without evidence of aneurysm, dissection, vasculitis or significant stenosis. SMA: Patent without evidence of aneurysm, dissection, vasculitis or significant stenosis. Renals: 2 right (very small accessory artery to the lower pole of the right kidney) and single left renal arteries are patent without evidence of aneurysm, dissection, vasculitis, fibromuscular dysplasia or significant stenosis. IMA: Patent without evidence of aneurysm, dissection, vasculitis or significant stenosis. Inflow: Patent without evidence of aneurysm, dissection, vasculitis or significant stenosis. Veins: Suboptimally evaluated. IVC filter with apex in the infrarenal IVC. Review of the MIP images confirms the above findings. NON-VASCULAR Hepatobiliary: No focal hepatic abnormality. Intra hepatic biliary dilatation at the right hepatic lobe was better seen on contrast enhanced examination. Distended gallbladder without calcified stone. Pancreas: Unremarkable. No pancreatic ductal dilatation or surrounding inflammatory changes. Spleen: Normal in size without focal abnormality. Adrenals/Urinary Tract: Adrenal glands are within normal limits. Renal cortical scarring. Multiple left renal cysts for which no imaging follow-up is recommended. Cortical hypoenhancement involving the left greater than right kidneys, most evident at superior pole. The bladder is slightly thick walled. Stomach/Bowel: The stomach is nonenlarged. There is no dilated small bowel. Large stool burden suggests constipation. No acute bowel wall thickening. Diverticular disease of the sigmoid colon. Moderate rectal distension by feces with some presacral edema. Lymphatic: No suspicious lymph nodes Reproductive: Hysterectomy.  No adnexal mass Other: No free air.  Generalized subcutaneous edema Musculoskeletal: Left hip hardware. Avascular necrosis of the femoral heads without collapse. Old left inferior pubic ramus fracture. Review of the MIP images  confirms the above findings. IMPRESSION: 1. Negative for acute aortic dissection or aneurysm. No significant stenosis or evidence for acute occlusive disease. 2. Small pleural effusions with dependent atelectasis at the bases. 3. Cortical hypoenhancement involving the left greater than right kidneys, findings could be secondary to pyelonephritis though infarct is also included in the differential given appearance of superior pole left kidney. 4. Slightly thick-walled appearance of urinary bladder, correlate with urinalysis. 5. Large stool burden suggests constipation. Moderate rectal distension by feces. 6. Generalized subcutaneous edema. Electronically Signed   By: Jasmine Pang M.D.   On: 04/25/2023 21:41   CT HEAD WO CONTRAST ( ) Result Date: 04/25/2023 CLINICAL DATA:  Mental status change, unknown cause EXAM: CT HEAD WITHOUT CONTRAST TECHNIQUE: Contiguous axial images were obtained from the base of the skull through the vertex without intravenous contrast. RADIATION DOSE REDUCTION: This exam was performed according to the departmental dose-optimization program which includes automated exposure control, adjustment of the mA and/or kV according to patient size and/or use of iterative reconstruction technique. COMPARISON:  04/01/2023 FINDINGS: Brain: No evidence of acute infarction, hemorrhage, hydrocephalus, extra-axial collection or mass lesion/mass effect. Extensive low-density changes within the periventricular and subcortical white matter most compatible with chronic microvascular ischemic change. Moderate diffuse cerebral volume loss. Vascular: Atherosclerotic calcifications involving the large vessels of the skull base. No unexpected hyperdense vessel. Skull: Normal. Negative for fracture or focal lesion. Sinuses/Orbits: No acute finding. Other: None. IMPRESSION: 1. No acute intracranial findings. 2. Advanced chronic microvascular ischemic change and  cerebral volume loss. Electronically Signed   By:  Duanne Guess D.O.   On: 04/25/2023 20:10   DG Chest Port 1 View Result Date: 04/25/2023 CLINICAL DATA:  Altered mental status EXAM: PORTABLE CHEST 1 VIEW COMPARISON:  Chest x-ray 10/14/2022 FINDINGS: The heart size and mediastinal contours are within normal limits. Both lungs are clear. The visualized skeletal structures are unremarkable. IMPRESSION: No active disease. Electronically Signed   By: Darliss Cheney M.D.   On: 04/25/2023 19:10        Scheduled Meds:  acetaminophen  650 mg Oral Once   aspirin EC  81 mg Oral Daily   bisacodyl  10 mg Rectal Daily   carvedilol  25 mg Oral BID WC   enoxaparin (LOVENOX) injection  30 mg Subcutaneous Q24H   insulin aspart  0-5 Units Subcutaneous QHS   insulin aspart  0-9 Units Subcutaneous TID WC   levETIRAcetam  750 mg Oral BID   lidocaine  1 patch Transdermal Q24H   pantoprazole  40 mg Oral Daily   [START ON 04/27/2023] polyethylene glycol  17 g Oral Daily   senna-docusate  1 tablet Oral BID   Continuous Infusions:  cefTRIAXone (ROCEPHIN)  IV     lactated ringers 50 mL/hr at 04/26/23 0548     LOS: 1 day    Time spent: 45 mins    Ramiro Harvest, MD [facility]   To contact the attending provider between 7A-7P or the covering provider during after hours 7P-7A, please log into the web site [email or web address] and access using universal [employer] password for that web site. If you do not have the password, please call the hospital operator.  04/26/2023, 10:17 AM

## 2023-04-26 NOTE — TOC Initial Note (Addendum)
Transition of Care (TOC) - Initial/Assessment Note   Spoke to patient's son at bedside. Patient lives with son. Confirmed face sheet information.   Son reports his sister Theodoro Parma is POA , however [name] is currently sick with a fever    Patient has ramp, walker and wheelchair at home.   Patient active with [facility] for HHPT , family would like to continue. Messaged [name] with [facility] will need order [name] confirmed patient is active for HHPT   PT/OT evaluations pending   Son requesting PTAR transportation home at discharge.  Patient Details  Name: Brandy Gonzales MRN: [id number] Date of Birth: 04-02-1941  Transition [facility] Care [facility]) CM/SW Contact:    Kingsley Plan, RN Phone Number: 04/26/2023, 12:15 PM  Clinical Narrative:                   Expected Discharge Plan: Home w Home Health Services (await PT/OT evaluations) Barriers to Discharge: Continued Medical Work up   Patient Goals and CMS Choice   CMS Medicare.gov Compare Post Acute Care list provided to:: Patient Represenative (must comment) (son)        Expected Discharge Plan and Services   Discharge Planning Services: CM Consult Post Acute Care Choice: Home Health Living arrangements for the past 2 months: Single Family Home                 DME Arranged:  (await PT evalauation)           HH Agency:  (see note)        Prior Living Arrangements/Services Living arrangements for the past 2 months: Single Family Home Lives with:: Adult Children              Current home services: DME    Activities of Daily Living      Permission Sought/Granted                  Emotional Assessment              Admission diagnosis:  Pyelonephritis [N12] Patient Active Problem List   Diagnosis Date Noted   Constipation 04/26/2023   General weakness 04/26/2023   Pyelonephritis 04/25/2023   Seizure (HCC) 04/01/2023   Alzheimer's dementia (HCC) 02/10/2023   Metabolic encephalopathy 02/08/2023    Unspecified Escherichia coli (E. coli) as the cause of diseases classified elsewhere 02/08/2023   History of Bell's palsy 10/15/2022   Hematoma of left foot 10/15/2022   Hematoma 10/09/2022   Anemia 06/11/2022   History of DVT (deep vein thrombosis) 05/18/2022   Current long-term use of anticoagulant medication with history of deep venous thrombosis 04/2022 (DVT) 05/18/2022   History of insertion of IVC (inferior vena caval) filter 2006 05/18/2022   Swelling of left foot 05/18/2022   Type 2 diabetes mellitus with diabetic chronic kidney disease (HCC) 05/06/2022   Long term (current) use of oral hypoglycemic drugs 05/06/2022   Protein-calorie malnutrition, severe (HCC) 04/28/2022   Dyslipidemia 04/11/2020   Abdominal enlargement 04/05/2018   Stage 3b chronic kidney disease (CKD) (HCC) 11/30/2017   Leg wound, right 08/08/2015   OAB (overactive bladder) 04/22/2015   Leg wound, left 11/01/2014   Edema 11/01/2014   Seborrheic dermatitis 09/26/2013   Dysphagia 01/23/2013   Syncope 10/03/2012   Well adult exam 09/22/2012   Nonspecific abnormal electrocardiogram (ECG) (EKG) 09/22/2012   Osteoarthritis 05/19/2012   Glaucoma 09/10/2011   Dysuria 07/17/2009   Diabetic nephropathy (HCC) 06/27/2008   HTN (hypertension) 04/27/2007  GERD 04/27/2007   Long term (current) use of aspirin 05/12/1999   Personal history of (healed) traumatic fracture 05/12/1999   Personal history of other venous thrombosis and embolism 05/12/1999   Presence of other vascular implants and grafts 05/12/1999   PCP:  Tresa Garter, MD Pharmacy:   [id number] - [place], [place] - [address] AT [facility] OF [address] [place] [address] Phone: [phone] Fax: [phone]  [id number] - [place] - [address] [place] [address] Phone: [phone] Fax: [phone]  [id number] [place], [place] - [address] AT [facility] OF [address] [place] [address] [place] [address] Phone: [phone] Fax: [phone]     Social Drivers of Health (SDOH) Social History: SDOH Screenings   Food Insecurity: Food Insecurity Present (04/13/2023)  Housing: Patient Declined (04/13/2023)  Transportation Needs: No Transportation Needs (04/13/2023)  Utilities: Not At Risk (04/05/2023)  Alcohol Screen: Low Risk  (03/24/2021)  Depression (PHQ2-9): High Risk (04/13/2023)  Financial Resource Strain: Medium Risk (04/13/2023)  Physical Activity: Unknown (04/13/2023)  Social Connections: Socially Isolated (04/13/2023)  Stress: No Stress Concern Present (04/13/2023)  Tobacco Use: Low Risk  (04/25/2023)   SDOH Interventions:     Readmission Risk Interventions    06/12/2022   11:19 AM 05/20/2022    4:35 PM  Readmission Risk Prevention Plan  Transportation Screening Complete Complete  PCP or Specialist Appt within 3-5 Days Complete Complete  HRI or Home Care Consult Complete --  Social Work Consult for Recovery Care Planning/Counseling Complete Complete  Palliative Care Screening Not Applicable Not Applicable  Medication Review Oceanographer) Complete Complete

## 2023-04-26 NOTE — Plan of Care (Signed)
  Problem: Education: Goal: Ability to describe self-care measures that may prevent or decrease complications (Diabetes Survival Skills Education) will improve Outcome: Progressing   Problem: Nutritional: Goal: Maintenance of adequate nutrition will improve Outcome: Progressing   Problem: Skin Integrity: Goal: Risk for impaired skin integrity will decrease Outcome: Progressing   Problem: Education: Goal: Knowledge of General Education information will improve Description: Including pain rating scale, medication(s)/side effects and non-pharmacologic comfort measures Outcome: Progressing   

## 2023-04-27 DIAGNOSIS — K219 Gastro-esophageal reflux disease without esophagitis: Secondary | ICD-10-CM | POA: Diagnosis not present

## 2023-04-27 DIAGNOSIS — I10 Essential (primary) hypertension: Secondary | ICD-10-CM | POA: Diagnosis not present

## 2023-04-27 DIAGNOSIS — N12 Tubulo-interstitial nephritis, not specified as acute or chronic: Secondary | ICD-10-CM | POA: Diagnosis not present

## 2023-04-27 DIAGNOSIS — E1122 Type 2 diabetes mellitus with diabetic chronic kidney disease: Secondary | ICD-10-CM | POA: Diagnosis not present

## 2023-04-27 LAB — BASIC METABOLIC PANEL
Anion gap: 10 (ref 5–15)
BUN: 22 mg/dL (ref 8–23)
CO2: 22 mmol/L (ref 22–32)
Calcium: 9.6 mg/dL (ref 8.9–10.3)
Chloride: 113 mmol/L — ABNORMAL HIGH (ref 98–111)
Creatinine, Ser: 1.1 mg/dL — ABNORMAL HIGH (ref 0.44–1.00)
GFR, Estimated: 50 mL/min — ABNORMAL LOW (ref >60)
Glucose, Bld: 125 mg/dL — ABNORMAL HIGH (ref 70–99)
Potassium: 4.3 mmol/L (ref 3.5–5.1)
Sodium: 145 mmol/L (ref 135–145)

## 2023-04-27 LAB — CBC WITH DIFFERENTIAL/PLATELET
Abs Immature Granulocytes: 0.02 10*3/uL (ref 0.00–0.07)
Basophils Absolute: 0 10*3/uL (ref 0.0–0.1)
Basophils Relative: 1 %
Eosinophils Absolute: 0.3 10*3/uL (ref 0.0–0.5)
Eosinophils Relative: 4 %
HCT: 35.2 % — ABNORMAL LOW (ref 36.0–46.0)
Hemoglobin: 11.6 g/dL — ABNORMAL LOW (ref 12.0–15.0)
Immature Granulocytes: 0 %
Lymphocytes Relative: 33 %
Lymphs Abs: 2.2 10*3/uL (ref 0.7–4.0)
MCH: 31.4 pg (ref 26.0–34.0)
MCHC: 33 g/dL (ref 30.0–36.0)
MCV: 95.1 fL (ref 80.0–100.0)
Monocytes Absolute: 0.7 10*3/uL (ref 0.1–1.0)
Monocytes Relative: 10 %
Neutro Abs: 3.4 10*3/uL (ref 1.7–7.7)
Neutrophils Relative %: 52 %
Platelets: 153 10*3/uL (ref 150–400)
RBC: 3.7 MIL/uL — ABNORMAL LOW (ref 3.87–5.11)
RDW: 13.2 % (ref 11.5–15.5)
WBC: 6.6 10*3/uL (ref 4.0–10.5)
nRBC: 0 % (ref 0.0–0.2)

## 2023-04-27 LAB — MAGNESIUM: Magnesium: 1.7 mg/dL (ref 1.7–2.4)

## 2023-04-27 LAB — GLUCOSE, CAPILLARY
Glucose-Capillary: 123 mg/dL — ABNORMAL HIGH (ref 70–99)
Glucose-Capillary: 178 mg/dL — ABNORMAL HIGH (ref 70–99)
Glucose-Capillary: 227 mg/dL — ABNORMAL HIGH (ref 70–99)
Glucose-Capillary: 200 mg/dL — ABNORMAL HIGH (ref 70–99)

## 2023-04-27 MED ORDER — HYDRALAZINE HCL 25 MG PO TABS
25.0000 mg | ORAL_TABLET | Freq: Two times a day (BID) | ORAL | Status: DC
  Administered 2023-04-27 – 2023-04-28 (×3): 25 mg via ORAL
  Filled 2023-04-27 (×3): qty 1

## 2023-04-27 MED ORDER — MAGNESIUM SULFATE 2 GM/50ML IV SOLN
2.0000 g | Freq: Once | INTRAVENOUS | Status: AC
  Administered 2023-04-27: 2 g via INTRAVENOUS
  Filled 2023-04-27: qty 50

## 2023-04-27 NOTE — Evaluation (Signed)
Occupational Therapy Evaluation/Discharge Patient Details Name: Brandy Gonzales MRN: [id number] DOB: 02-13-1941 Today's Date: 04/27/2023   History of Present Illness Patient is a 82 year old female with generalized weakness, pyelonephritis. History of dementia, HTN, GERD, CKD, anemia, DVT, glaucoma. Recent admit for new onset seizures.   Clinical Impression   Patient evaluated by Occupational Therapy with no further acute OT needs identified. All education has been completed and the patient has no further questions. Pt is currently functioning at baseline. Pt lives with family who provide assist for all BADL tasks. No follow-up Occupational Therapy or equipment needs. OT is signing off. Thank you for this referral.        If plan is discharge home, recommend the following: A lot of help with walking and/or transfers;A lot of help with bathing/dressing/bathroom;Supervision due to cognitive status    Functional Status Assessment  Patient has not had a recent decline in their functional status  Equipment Recommendations  None recommended by OT       Precautions / Restrictions Precautions Precautions: Fall Precaution Comments: dementia Restrictions Weight Bearing Restrictions Per Provider Order: No      Mobility Bed Mobility Overal bed mobility: Needs Assistance Bed Mobility: Supine to Sit     Supine to sit: Max assist, HOB elevated, Used rails     General bed mobility comments: Physical assistance required to bring BLE off EOB, With HOB elevated, OT assisted with bringing trunk off elebated HOB while pt did assist some with activity. Bad pad used to scoot hips towards EOB    Transfers Overall transfer level: Needs assistance Equipment used: None (1 person face to face transfer technique) Transfers: Sit to/from Stand, Bed to chair/wheelchair/BSC Sit to Stand: Max assist, From elevated surface     Step pivot transfers: Mod assist     General transfer comment: VC for  technique such as using hands to push from bed. VC for direction. Pt very slow moving and demonstrated a severe forward flexed posture.      Balance Overall balance assessment: Needs assistance Sitting-balance support: Bilateral upper extremity supported, Feet supported Sitting balance-Leahy Scale: Fair Sitting balance - Comments: Able to sit EOB without physical assist. Close CGA provided for safety.   Standing balance support: Single extremity supported, During functional activity Standing balance-Leahy Scale: Poor Standing balance comment: reliant on OT for external support.      ADL either performed or assessed with clinical judgement   ADL Overall ADL's : At baseline;Needs assistance/impaired      General ADL Comments: Pt requires extensive assist (~max-total assist) for BADL tasks which is her baseline     Vision Baseline Vision/History: 3 Glaucoma Ability to See in Adequate Light: 2 Moderately impaired Patient Visual Report: No change from baseline       Perception Perception: Not tested       Praxis Praxis: Not tested       Pertinent Vitals/Pain Pain Assessment Pain Assessment: Faces Faces Pain Scale: Hurts even more Pain Location: Reports that her rib cage hurts when attempting to scoot hips towards EOB Pain Descriptors / Indicators: Grimacing, Guarding, Discomfort Pain Intervention(s): Limited activity within patient's tolerance, Monitored during session     Extremity/Trunk Assessment Upper Extremity Assessment Upper Extremity Assessment: Generalized weakness   Lower Extremity Assessment Lower Extremity Assessment: Generalized weakness   Cervical / Trunk Assessment Cervical / Trunk Assessment: Kyphotic (severely forward flexed)   Communication Communication Communication: Hearing impairment Cueing Techniques: Verbal cues   Cognition Arousal: Alert Behavior During Therapy:  Flat affect Overall Cognitive Status: History of cognitive impairments - at  baseline       General Comments: Patient is oriented to person, place. Encouragement required for participation     General Comments  Son present during end of session and provided additional background information. HR: 60 bpm, SpO2 100% on RA            Home Living Family/patient expects to be discharged to:: Private residence Living Arrangements: Other relatives (multiple people. son reports he recently moved in also to provide respite for other family members) Available Help at Discharge: Family Type of Home: House Home Access: Ramped entrance     Home Layout: One level     Bathroom Shower/Tub: Producer, television/film/video: Handicapped height Bathroom Accessibility: Yes   Home Equipment: Agricultural consultant (2 wheels);Rollator (4 wheels);Standard Walker;Cane - single point;BSC/3in1;Wheelchair - manual;Hospital bed          Prior Functioning/Environment Prior Level of Function : Needs assist    Mobility Comments: per pt's daughter, she will do more for some family members and less for others; she walks into bathroom 3x/wk for shower; daughter transfers her in/out of car for appointments (uses wheelchair) ADLs Comments: pt's assist level varies depending on which family members are present (some do more for her and some make her do more); able to self-feed, transfer into shower onto BSC/shower seat        OT Problem List: Decreased strength         OT Goals(Current goals can be found in the care plan section) Acute Rehab OT Goals Patient Stated Goal: to eat french toast OT Goal Formulation: All assessment and education complete, DC therapy  OT Frequency:  1X visit       AM-PAC OT "6 Clicks" Daily Activity     Outcome Measure Help from another person eating meals?: A Little Help from another person taking care of personal grooming?: A Little Help from another person toileting, which includes using toliet, bedpan, or urinal?: Total Help from another person  bathing (including washing, rinsing, drying)?: Total Help from another person to put on and taking off regular upper body clothing?: Total Help from another person to put on and taking off regular lower body clothing?: Total 6 Click Score: 10   End of Session Equipment Utilized During Treatment: Gait belt Nurse Communication: Mobility status  Activity Tolerance: Patient tolerated treatment well Patient left: in chair;with call bell/phone within reach;with chair alarm set;with family/visitor present  OT Visit Diagnosis: Muscle weakness (generalized) (M62.81)                Time: 8295-6213 OT Time Calculation (min): 47 min Charges:  OT General Charges $OT Visit: 1 Visit OT Evaluation $OT Eval High Complexity: 1 High OT Treatments $Self Care/Home Management : 8-22 mins $Therapeutic Activity: 8-22 mins  Limmie Patricia, OTR/L,CBIS  Supplemental OT - [facility] and [facility] Secure Chat Preferred    Corisa Montini, Charisse March 04/27/2023, 1:09 PM

## 2023-04-27 NOTE — Plan of Care (Signed)
  Problem: Education: Goal: Ability to describe self-care measures that may prevent or decrease complications (Diabetes Survival Skills Education) will improve Outcome: Progressing Goal: Individualized Educational Video(s) Outcome: Progressing   

## 2023-04-27 NOTE — Progress Notes (Signed)
PROGRESS NOTE    Brandy Gonzales  WUJ:[id number] DOB: 10-01-1940 DOA: 04/25/2023 PCP: Tresa Garter, MD    Chief Complaint  Patient presents with   Weakness    Brief Narrative:  Patient pleasant 82 year old female history of dementia, hypertension, hyperlipidemia, GERD, CKD 3B, AOCD, history of DVT, glaucoma recently hospitalized for new onset seizures 04/01/2023-04/04/2023, was on Keppra presenting to the ED with progressive generalized weakness and abdominal pain x 1 day.  Workup in the ED with a urinalysis concerning for UTI, CT angiogram chest abdomen and pelvis with no evidence of acute aortic dissection or aneurysm, no significant stenosis, no evidence of acute occlusive disease, cortical hypoenhancement involving left greater than right kidney finding could be secondary to pyelonephritis though infarct is also included in differential given appearance of superior pole of the left kidney.  Slightly thickened wall appearance of the urinary bladder correlate with urinalysis.  Also concerns for constipation with moderate rectal distention by feces.  Patient placed empirically on IV Rocephin.   Assessment & Plan:   Principal Problem:   Pyelonephritis Active Problems:   HTN (hypertension)   GERD   Stage 3b chronic kidney disease (CKD) (HCC)   Type 2 diabetes mellitus with diabetic chronic kidney disease (HCC)   Alzheimer's dementia (HCC)   Seizure (HCC)   Constipation   General weakness  #1 acute bilateral pyelonephritis L > R, POA -Patient presented with generalized weakness, abdominal pain, flank pain. -Urinalysis concerning for UTI with pyuria. -CT angiogram abdomen/chest/pelvis concerning for pyelonephritis left greater than right. -Urine cultures preliminary results with > 100,000 colonies of gram-negative rods.   -Blood cultures pending with no growth to date.   -Continue IV Rocephin 2 g daily pending culture results and once urine cultures are finalized could  transition to oral antibiotics to treat for total of 10 to 14 days.  -Supportive care.    2.  Recently diagnosed seizure disorder -Patient recently diagnosed with seizure disorder during last hospitalization 04/01/2023-04/04/2023. 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 -Continue home regimen Keppra. -Seizure precautions. -Outpatient follow-up with neurology.  3.  Constipation -Patient noted with significant constipation on CT scan. -Continue Dulcolax suppository daily. -Continue MiraLAX 17 g daily, Senokot-S twice daily.  4.  CKD 3B -Baseline creatinine approximately 1.2. -Avoid nephrotoxic agents, hypotension, dehydration. -Follow.  5.  Type 2 diabetes mellitus with hyperglycemia -Hemoglobin A1c 5.8 (04/13/2023) -CBG noted at 123 this morning. -Continue to hold home regimen oral hypoglycemic agents. -SSI.  6.  GERD -PPI.  7.  Hypertension -BP is still elevated.   -Patient with no significant pain this morning.   -Continue home regimen Coreg 25 mg twice daily.   -Start hydralazine 25 mg twice daily for better blood pressure control.  8.  Generalized weakness -PT/OT. -Treat problem #1. -Fall precautions. -Likely needs home health which has been ordered.  9.  Dementia -Stable.  10.  History of DVT -Not on anticoagulation. -Status post IVC filter   DVT prophylaxis: Lovenox Code Status: Full Family Communication: Updated patient and son at bedside. Disposition: TBD  Status is: Inpatient Remains inpatient appropriate because: Severity of illness   Consultants:  None  Procedures:  CT angiogram chest/abdomen/pelvis 04/25/2023 CT head 04/25/2023 Chest x-ray 04/25/2023   Antimicrobials:  Anti-infectives (From admission, onward)    Start  Dose/Rate Route Frequency Ordered Stop   04/26/23 2300  cefTRIAXone (ROCEPHIN) 2 g in sodium chloride 0.9 % 100 mL IVPB        2 g 200 mL/hr over 30 Minutes Intravenous Every 24 hours 04/26/23 0753     04/25/23 2300  cefTRIAXone (ROCEPHIN) 1 g in sodium chloride 0.9 % 100 mL IVPB        1 g 200 mL/hr over 30 Minutes Intravenous  Once 04/25/23 2254 04/26/23 0207         Subjective: Patient sleeping but easily arousable.  Denies any chest pain or shortness of breath.  No abdominal pain.  States bilateral flank pain improving.  Son at bedside.    Objective: Vitals:   04/27/23 0501 04/27/23 0503 04/27/23 0838 04/27/23 0838  BP: (!) 170/83  (!) 173/75 (!) 197/80  Pulse: 61  (!) 56 (!) 58  Resp: 18   16  Temp: 97.6 F (36.4 C)  97.9 F (36.6 C) 97.9 F (36.6 C)  TempSrc: Oral  Oral Oral  SpO2: 100%  100% 100%  Weight:  48.4 kg    Height:        Intake/Output Summary (Last 24 hours) at 04/27/2023 0942 Last data filed at 04/27/2023 0502 Gross per 24 hour  Intake 643.34 ml  Output 350 ml  Net 293.34 ml   Filed Weights   04/25/23 2302 04/26/23 1329 04/27/23 0503  Weight: 48.4 kg 48.4 kg 48.4 kg    Examination:  General exam: NAD Respiratory system: Lungs clear to auscultation bilaterally anterior lung fields.  No wheezes, no crackles, no rhonchi.  Fair air movement.  Speaking in full sentences.   Cardiovascular system: Regular rate rhythm no murmurs rubs or gallops.  No JVD.  No pitting lower extremity edema. Gastrointestinal system: Abdomen is soft, nontender, nondistended, positive bowel sounds.  Decreased bilateral CVA TTP.  Central nervous system: Alert and oriented. No focal neurological deficits. Extremities: Symmetric 5 x 5 power. Skin: No rashes, lesions or ulcers Psychiatry: Judgement and insight appear normal. Mood & affect appropriate.     Data Reviewed: I have personally reviewed following labs and imaging studies  CBC: Recent Labs  Lab 04/25/23 1826  04/26/23 0540 04/27/23 0827  WBC 7.4 5.3 6.6  NEUTROABS 4.9  --  3.4  HGB 11.8* 10.1* 11.6*  HCT 35.2* 30.2* 35.2*  MCV 94.1 94.4 95.1  PLT 197 169 153    Basic Metabolic Panel: Recent Labs  Lab 04/25/23 1826 04/26/23 0540 04/27/23 0630  NA 145 141 145  K 4.1 4.0 4.3  CL 113* 115* 113*  CO2 22 22 22   GLUCOSE 174* 266* 125*  BUN 25* 23 22  CREATININE 1.21* 1.19* 1.10*  CALCIUM 9.9 9.1 9.6  MG 1.8 1.9 1.7  PHOS  --  2.9  --     GFR: Estimated Creatinine Clearance: 26.4 mL/min (A) (by C-G formula based on SCr of 1.1 mg/dL (H)).  Liver Function Tests: Recent Labs  Lab 04/25/23 1826 04/26/23 0540  AST 34 32  ALT 35 34  ALKPHOS 157* 125  BILITOT 1.1 0.6  PROT 5.5* 5.0*  ALBUMIN 3.0* 2.7*    CBG: Recent Labs  Lab 04/26/23 0846 04/26/23 1201 04/26/23 1544 04/26/23 2115 04/27/23 0837  GLUCAP 216* 220* 150* 143* 123*     Recent Results (from the past 240 hours)  Urine Culture     Status: Abnormal (Preliminary result)   Collection Time: 04/25/23 10:05 PM   Specimen: Urine, Random  Result  Value Ref Range Status   Specimen Description URINE, RANDOM  Final   Special Requests   Final    NONE Reflexed from [id number] Performed at [facility], [address], [place], [place] [address]    Culture >=100,000 COLONIES/mL GRAM NEGATIVE RODS (A)  Final   Report Status PENDING  Incomplete  Culture, blood (Routine X 2) w Reflex to ID Panel     Status: None (Preliminary result)   Collection Time: 04/26/23 11:17 AM   Specimen: BLOOD  Result Value Ref Range Status   Specimen Description BLOOD BLOOD RIGHT HAND  Final   Special Requests   Final    AEROBIC BOTTLE ONLY Blood Culture results may not be optimal due to an inadequate volume of blood received in culture bottles   Culture   Final    NO GROWTH < 24 HOURS Performed at [facility], [address] [place], [place] [address]    Report Status PENDING  Incomplete  Culture, blood (Routine X 2) w Reflex  to ID Panel     Status: None (Preliminary result)   Collection Time: 04/26/23 11:17 AM   Specimen: BLOOD  Result Value Ref Range Status   Specimen Description BLOOD BLOOD LEFT ARM  Final   Special Requests   Final    AEROBIC BOTTLE ONLY Blood Culture results may not be optimal due to an inadequate volume of blood received in culture bottles   Culture   Final    NO GROWTH < 24 HOURS Performed at [facility], [address], [place], [place] [address]    Report Status PENDING  Incomplete         Radiology Studies: CT Angio Chest/Abd/Pel for Dissection W and/or Wo Contrast Result Date: 04/25/2023 CLINICAL DATA:  Dementia weakness pain EXAM: CT ANGIOGRAPHY CHEST, ABDOMEN AND PELVIS TECHNIQUE: Non-contrast CT of the chest was initially obtained. Multidetector CT imaging through the chest, abdomen and pelvis was performed using the standard protocol during bolus administration of intravenous contrast. Multiplanar reconstructed images and MIPs were obtained and reviewed to evaluate the vascular anatomy. RADIATION DOSE REDUCTION: This exam was performed according to the departmental dose-optimization program which includes automated exposure control, adjustment of the mA and/or kV according to patient size and/or use of iterative reconstruction technique. CONTRAST:  75mL OMNIPAQUE IOHEXOL 350 MG/ML SOLN COMPARISON:  Chest x-ray 04/25/2023, CT 05/18/2022 FINDINGS: CTA CHEST FINDINGS Cardiovascular: Non contrasted images of the chest demonstrate no acute intramural hematoma. Nonaneurysmal aorta. No dissection is seen. Artifact obscures part of the aortic arch. Normal cardiac size. Coronary vascular calcification. No pericardial effusion Mediastinum/Nodes: Patent trachea. No thyroid mass. Borderline mediastinal lymph nodes. Right precarinal node measures 9 mm. Subcarinal node measures 10 mm. Multiple subcentimeter AP window nodes. Esophagus within normal limits. Lungs/Pleura: Small pleural  effusions. Dependent atelectasis at the bases. No acute airspace disease or pneumothorax. Musculoskeletal: Old appearing sternal fracture. Vertebral body heights are maintained. Review of the MIP images confirms the above findings. CTA ABDOMEN AND PELVIS FINDINGS VASCULAR Aorta: Normal caliber aorta without aneurysm, dissection, vasculitis or significant stenosis. Celiac: Patent without evidence of aneurysm, dissection, vasculitis or significant stenosis. SMA: Patent without evidence of aneurysm, dissection, vasculitis or significant stenosis. Renals: 2 right (very small accessory artery to the lower pole of the right kidney) and single left renal arteries are patent without evidence of aneurysm, dissection, vasculitis, fibromuscular dysplasia or significant stenosis. IMA: Patent without evidence of aneurysm, dissection, vasculitis or significant stenosis. Inflow: Patent without  evidence of aneurysm, dissection, vasculitis or significant stenosis. Veins: Suboptimally evaluated. IVC filter with apex in the infrarenal IVC. Review of the MIP images confirms the above findings. NON-VASCULAR Hepatobiliary: No focal hepatic abnormality. Intra hepatic biliary dilatation at the right hepatic lobe was better seen on contrast enhanced examination. Distended gallbladder without calcified stone. Pancreas: Unremarkable. No pancreatic ductal dilatation or surrounding inflammatory changes. Spleen: Normal in size without focal abnormality. Adrenals/Urinary Tract: Adrenal glands are within normal limits. Renal cortical scarring. Multiple left renal cysts for which no imaging follow-up is recommended. Cortical hypoenhancement involving the left greater than right kidneys, most evident at superior pole. The bladder is slightly thick walled. Stomach/Bowel: The stomach is nonenlarged. There is no dilated small bowel. Large stool burden suggests constipation. No acute bowel wall thickening. Diverticular disease of the sigmoid colon.  Moderate rectal distension by feces with some presacral edema. Lymphatic: No suspicious lymph nodes Reproductive: Hysterectomy.  No adnexal mass Other: No free air.  Generalized subcutaneous edema Musculoskeletal: Left hip hardware. Avascular necrosis of the femoral heads without collapse. Old left inferior pubic ramus fracture. Review of the MIP images confirms the above findings. IMPRESSION: 1. Negative for acute aortic dissection or aneurysm. No significant stenosis or evidence for acute occlusive disease. 2. Small pleural effusions with dependent atelectasis at the bases. 3. Cortical hypoenhancement involving the left greater than right kidneys, findings could be secondary to pyelonephritis though infarct is also included in the differential given appearance of superior pole left kidney. 4. Slightly thick-walled appearance of urinary bladder, correlate with urinalysis. 5. Large stool burden suggests constipation. Moderate rectal distension by feces. 6. Generalized subcutaneous edema. Electronically Signed   By: Jasmine Pang M.D.   On: 04/25/2023 21:41   CT HEAD WO CONTRAST ( ) Result Date: 04/25/2023 CLINICAL DATA:  Mental status change, unknown cause EXAM: CT HEAD WITHOUT CONTRAST TECHNIQUE: Contiguous axial images were obtained from the base of the skull through the vertex without intravenous contrast. RADIATION DOSE REDUCTION: This exam was performed according to the departmental dose-optimization program which includes automated exposure control, adjustment of the mA and/or kV according to patient size and/or use of iterative reconstruction technique. COMPARISON:  04/01/2023 FINDINGS: Brain: No evidence of acute infarction, hemorrhage, hydrocephalus, extra-axial collection or mass lesion/mass effect. Extensive low-density changes within the periventricular and subcortical white matter most compatible with chronic microvascular ischemic change. Moderate diffuse cerebral volume loss. Vascular:  Atherosclerotic calcifications involving the large vessels of the skull base. No unexpected hyperdense vessel. Skull: Normal. Negative for fracture or focal lesion. Sinuses/Orbits: No acute finding. Other: None. IMPRESSION: 1. No acute intracranial findings. 2. Advanced chronic microvascular ischemic change and cerebral volume loss. Electronically Signed   By: Duanne Guess D.O.   On: 04/25/2023 20:10   DG Chest Port 1 View Result Date: 04/25/2023 CLINICAL DATA:  Altered mental status EXAM: PORTABLE CHEST 1 VIEW COMPARISON:  Chest x-ray 10/14/2022 FINDINGS: The heart size and mediastinal contours are within normal limits. Both lungs are clear. The visualized skeletal structures are unremarkable. IMPRESSION: No active disease. Electronically Signed   By: Darliss Cheney M.D.   On: 04/25/2023 19:10        Scheduled Meds:  acetaminophen  650 mg Oral Once   aspirin EC  81 mg Oral Daily   bisacodyl  10 mg Rectal Daily   carvedilol  25 mg Oral BID WC   enoxaparin (LOVENOX) injection  30 mg Subcutaneous Q24H   hydrALAZINE  25 mg Oral BID   insulin aspart  0-5 Units Subcutaneous QHS   insulin aspart  0-9 Units Subcutaneous TID WC   levETIRAcetam  750 mg Oral BID   lidocaine  1 patch Transdermal Q24H   pantoprazole  40 mg Oral Daily   polyethylene glycol  17 g Oral Daily   senna-docusate  1 tablet Oral BID   Continuous Infusions:  cefTRIAXone (ROCEPHIN)  IV 2 g (04/26/23 2220)   magnesium sulfate bolus IVPB       LOS: 2 days    Time spent: 35 mins    Ramiro Harvest, MD [facility]   To contact the attending provider between 7A-7P or the covering provider during after hours 7P-7A, please log into the web site [email or web address] and access using universal [place] password for that web site. If you do not have the password, please call the hospital operator.  04/27/2023, 9:42 AM

## 2023-04-27 NOTE — Plan of Care (Signed)

## 2023-04-27 NOTE — Progress Notes (Signed)
Lab called- CBC is clotted- they will redraw this morning.

## 2023-04-28 ENCOUNTER — Other Ambulatory Visit (HOSPITAL_COMMUNITY): Payer: Self-pay

## 2023-04-28 DIAGNOSIS — K5900 Constipation, unspecified: Secondary | ICD-10-CM | POA: Diagnosis not present

## 2023-04-28 DIAGNOSIS — N12 Tubulo-interstitial nephritis, not specified as acute or chronic: Secondary | ICD-10-CM | POA: Diagnosis not present

## 2023-04-28 DIAGNOSIS — I10 Essential (primary) hypertension: Secondary | ICD-10-CM | POA: Diagnosis not present

## 2023-04-28 DIAGNOSIS — R569 Unspecified convulsions: Secondary | ICD-10-CM | POA: Diagnosis not present

## 2023-04-28 LAB — URINE CULTURE: Culture: >=100000 — AB

## 2023-04-28 LAB — CBC WITH DIFFERENTIAL/PLATELET
Abs Immature Granulocytes: 0.02 10*3/uL (ref 0.00–0.07)
Basophils Absolute: 0 10*3/uL (ref 0.0–0.1)
Basophils Relative: 1 %
Eosinophils Absolute: 0.3 10*3/uL (ref 0.0–0.5)
Eosinophils Relative: 4 %
HCT: 31.3 % — ABNORMAL LOW (ref 36.0–46.0)
Hemoglobin: 10.5 g/dL — ABNORMAL LOW (ref 12.0–15.0)
Immature Granulocytes: 0 %
Lymphocytes Relative: 32 %
Lymphs Abs: 1.9 10*3/uL (ref 0.7–4.0)
MCH: 31.4 pg (ref 26.0–34.0)
MCHC: 33.5 g/dL (ref 30.0–36.0)
MCV: 93.7 fL (ref 80.0–100.0)
Monocytes Absolute: 0.5 10*3/uL (ref 0.1–1.0)
Monocytes Relative: 9 %
Neutro Abs: 3.2 10*3/uL (ref 1.7–7.7)
Neutrophils Relative %: 54 %
Platelets: 149 10*3/uL — ABNORMAL LOW (ref 150–400)
RBC: 3.34 MIL/uL — ABNORMAL LOW (ref 3.87–5.11)
RDW: 13.3 % (ref 11.5–15.5)
WBC: 6 10*3/uL (ref 4.0–10.5)
nRBC: 0 % (ref 0.0–0.2)

## 2023-04-28 LAB — GLUCOSE, CAPILLARY
Glucose-Capillary: 154 mg/dL — ABNORMAL HIGH (ref 70–99)
Glucose-Capillary: 149 mg/dL — ABNORMAL HIGH (ref 70–99)

## 2023-04-28 LAB — BASIC METABOLIC PANEL
Anion gap: 11 (ref 5–15)
BUN: 26 mg/dL — ABNORMAL HIGH (ref 8–23)
CO2: 20 mmol/L — ABNORMAL LOW (ref 22–32)
Calcium: 9.3 mg/dL (ref 8.9–10.3)
Chloride: 110 mmol/L (ref 98–111)
Creatinine, Ser: 1.4 mg/dL — ABNORMAL HIGH (ref 0.44–1.00)
GFR, Estimated: 37 mL/min — ABNORMAL LOW (ref >60)
Glucose, Bld: 169 mg/dL — ABNORMAL HIGH (ref 70–99)
Potassium: 4.5 mmol/L (ref 3.5–5.1)
Sodium: 141 mmol/L (ref 135–145)

## 2023-04-28 LAB — MAGNESIUM: Magnesium: 2.3 mg/dL (ref 1.7–2.4)

## 2023-04-28 MED ORDER — HYDRALAZINE HCL 25 MG PO TABS
25.0000 mg | ORAL_TABLET | Freq: Two times a day (BID) | ORAL | 0 refills | Status: DC
  Filled 2023-04-28: qty 60, 30d supply, fill #0

## 2023-04-28 MED ORDER — SENNOSIDES-DOCUSATE SODIUM 8.6-50 MG PO TABS
1.0000 | ORAL_TABLET | Freq: Two times a day (BID) | ORAL | 0 refills | Status: DC
  Filled 2023-04-28: qty 60, 30d supply, fill #0

## 2023-04-28 MED ORDER — POLYETHYLENE GLYCOL 3350 17 GM/SCOOP PO POWD
17.0000 g | Freq: Every day | ORAL | 0 refills | Status: DC
  Filled 2023-04-28: qty 238, 14d supply, fill #0

## 2023-04-28 MED ORDER — CEFADROXIL 500 MG PO CAPS
500.0000 mg | ORAL_CAPSULE | Freq: Every day | ORAL | 0 refills | Status: AC
  Filled 2023-04-28: qty 5, 5d supply, fill #0

## 2023-04-28 MED ORDER — CEFADROXIL 500 MG PO CAPS: 500.0000 mg | ORAL_CAPSULE | Freq: Two times a day (BID) | ORAL | Status: DC

## 2023-04-28 MED ORDER — CEFADROXIL 500 MG PO CAPS
500.0000 mg | ORAL_CAPSULE | Freq: Every day | ORAL | Status: DC
  Administered 2023-04-28: 500 mg via ORAL
  Filled 2023-04-28: qty 1

## 2023-04-28 NOTE — TOC Progression Note (Addendum)
Transition of Care (TOC) - Progression Note   Spoke to patient and son [name] at bedside. Son aware discharge is today , requesting ambulance transport home. Confirmed address. Son lives with patient. States POA [name] is aware , still not feeling well.   [name] with [facility] aware discharge is today   PT[facility] paperwork on chart. Nurse aware and will message NCM when ready for PTAR to be called.   1159 Called PT[facility] , estimated pick up time is " within the hour or a hour and a half". Nurse aware  Patient Details  Name: Brandy Gonzales MRN: [id number] Date of Birth: 11/21/1940  Transition of Care [facility]) CM/SW Contact  Willim Turnage, Adria Devon, RN Phone Number: 04/28/2023, 11:18 AM  Clinical Narrative:       Expected Discharge Plan: Home w Home Health Services (await PT/OT evaluations) Barriers to Discharge: Continued Medical Work up  Expected Discharge Plan and Services   Discharge Planning Services: CM Consult Post Acute Care Choice: Home Health Living arrangements for the past 2 months: Single Family Home Expected Discharge Date: 04/28/23               DME Arranged:  (await PT evalauation)           HH Agency:  (see note)         Social Determinants of Health (SDOH) Interventions SDOH Screenings   Food Insecurity: No Food Insecurity (04/26/2023)  Recent Concern: Food Insecurity - Food Insecurity Present (04/13/2023)  Housing: Low Risk  (04/26/2023)  Transportation Needs: No Transportation Needs (04/26/2023)  Utilities: Not At Risk (04/26/2023)  Alcohol Screen: Low Risk  (03/24/2021)  Depression (PHQ2-9): High Risk (04/13/2023)  Financial Resource Strain: Medium Risk (04/13/2023)  Physical Activity: Unknown (04/13/2023)  Social Connections: Socially Isolated (04/13/2023)  Stress: No Stress Concern Present (04/13/2023)  Tobacco Use: Low Risk  (04/25/2023)    Readmission Risk Interventions    06/12/2022   11:19 AM 05/20/2022    4:35 PM  Readmission Risk Prevention Plan   Transportation Screening Complete Complete  PCP or Specialist Appt within 3-5 Days Complete Complete  HRI or Home Care Consult Complete --  Social Work Consult for Recovery Care Planning/Counseling Complete Complete  Palliative Care Screening Not Applicable Not Applicable  Medication Review Oceanographer) Complete Complete

## 2023-04-28 NOTE — Progress Notes (Signed)
PHARMACY NOTE:  ANTIMICROBIAL RENAL DOSAGE ADJUSTMENT  Current antimicrobial regimen includes a mismatch between antimicrobial dosage and estimated renal function.  As per policy approved by the Pharmacy & Therapeutics and Medical Executive Committees, the antimicrobial dosage will be adjusted accordingly.  Current antimicrobial dosage:  Cefadroxil 500mg  BID  Indication: pyelonephritis  Renal Function:  Estimated Creatinine Clearance: 20.8 mL/min (A) (by C-G formula based on SCr of 1.4 mg/dL (H)).    Antimicrobial dosage has been changed to:  Cefadroxil 500mg  daily  Thank you for allowing pharmacy to be a part of this patient's care.  Christoper Fabian, PharmD, BCPS Please see amion for complete clinical pharmacist phone list 04/28/2023 9:38 AM

## 2023-04-28 NOTE — Progress Notes (Signed)
Physical Therapy Treatment Patient Details Name: Brandy Gonzales MRN: [id number] DOB: [birth year] Today's Date: 04/28/2023   History of Present Illness Patient is a 82 year old female with generalized weakness, pyelonephritis. History of dementia, HTN, GERD, CKD, anemia, DVT, glaucoma. Recent admit for new onset seizures.    PT Comments  Pt awake on entry, reporting that she is going home today. PT recommends at least getting up as she is going home via [place] and they will not be there until later. Pt ultimately agreeable, requiring maxA for coming to EoB. Once on EOB, pt noted to have had a BM. RN present and agreeable to perform pericare in standing. Pt require maxA to stand. Pt requires cuing for upright posture but ultimately fatigued to point of needing total A for standing. Once clean pt requiring total A to return to supine. Pt will benefit from HHPT at discharge to maintain strength and reduce caregiver burden.     If plan is discharge home, recommend the following: Two people to help with walking and/or transfers;A lot of help with bathing/dressing/bathroom;Assistance with cooking/housework;Direct supervision/assist for medications management;Direct supervision/assist for financial management;Assist for transportation;Help with stairs or ramp for entrance;Supervision due to cognitive status   Can travel by private vehicle      No  Equipment Recommendations  None recommended by PT       Precautions / Restrictions Precautions Precautions: Fall Restrictions Weight Bearing Restrictions Per Provider Order: No     Mobility  Bed Mobility Overal bed mobility: Needs Assistance Bed Mobility: Supine to Sit, Sit to Supine, Rolling     Supine to sit: HOB elevated, Used rails, Max assist Sit to supine: Total assist   General bed mobility comments: max physical A and cuing for sequencing use of bed rail to pull hips towards EoB, pad scoot requiried to get hips to EoB and feet to the floor, pt  with complete fatigue and requiring totalA for return to bed after standing for pericare.    Transfers Overall transfer level: Needs assistance Equipment used: 1 person hand held assist Transfers: Sit to/from Stand Sit to Stand: Max assist, From elevated surface, Total assist           General transfer comment: pt found to have had BM. Instead of returning to bed RN agreeable to clean pt in standing. Pt requires maxA face to face power up, maximal cuing for upright posture, with fatigue pt requiring total A for maintaining upright before total clean and she returned to seated         Balance Overall balance assessment: Needs assistance Sitting-balance support: Bilateral upper extremity supported, Feet supported Sitting balance-Leahy Scale: Poor Sitting balance - Comments: poor progressing to fair to zero with ~3 min of standing                                    Cognition Arousal: Alert Behavior During Therapy: Flat affect Overall Cognitive Status: History of cognitive impairments - at baseline                                 General Comments: Patient is oriented to person, place. Looking forward to going home today           General Comments General comments (skin integrity, edema, etc.): son present during session      Pertinent Vitals/Pain Pain  Assessment Pain Assessment: Faces Faces Pain Scale: Hurts even more Pain Location: reports her stomach hurts with standing Pain Descriptors / Indicators: Moaning, Grimacing Pain Intervention(s): Limited activity within patient's tolerance, Monitored during session, Repositioned     PT Goals (current goals can now be found in the care plan section) Acute Rehab PT Goals Patient Stated Goal: patient unable to state. son was unsure if he wanted home versus rehab PT Goal Formulation: With family Time For Goal Achievement: 05/10/23 Potential to Achieve Goals: Fair Progress towards PT goals:  Progressing toward goals    Frequency    Min 1X/week       AM-PAC PT "6 Clicks" Mobility   Outcome Measure  Help needed turning from your back to your side while in a flat bed without using bedrails?: Total Help needed moving from lying on your back to sitting on the side of a flat bed without using bedrails?: A Lot Help needed moving to and from a bed to a chair (including a wheelchair)?: A Lot Help needed standing up from a chair using your arms (e.g., wheelchair or bedside chair)?: A Lot Help needed to walk in hospital room?: Total Help needed climbing 3-5 steps with a railing? : Total 6 Click Score: 9    End of Session   Activity Tolerance: Patient limited by fatigue Patient left: in bed;with call bell/phone within reach;with bed alarm set;with family/visitor present;with nursing/sitter in room (son, nurse,) Nurse Communication: Mobility status PT Visit Diagnosis: Unsteadiness on feet (R26.81);Other abnormalities of gait and mobility (R26.89);Muscle weakness (generalized) (M62.81)     Time: 5621-3086 PT Time Calculation (min) (ACUTE ONLY): 26 min  Charges:    $Gait Training: 8-22 mins $Therapeutic Activity: 8-22 mins PT General Charges $$ ACUTE PT VISIT: 1 Visit                     Vale Mousseau B. Beverely Risen PT, DPT Acute Rehabilitation Services Please use secure chat or  Call Office [phone]    Elon Alas Fleet 04/28/2023, 2:12 PM

## 2023-04-28 NOTE — Discharge Summary (Signed)
Physician Discharge Summary  Brandy Gonzales:[id number] DOB: 1940-08-12 DOA: 04/25/2023  PCP: Tresa Garter, MD  Admit date: 04/25/2023 Discharge date: 04/28/2023  Time spent: 55 minutes  Recommendations for Outpatient Follow-up:  Follow-up with [name], [name], MD in 2 weeks.  On follow-up patient need a basic metabolic profile done to follow-up on electrolytes and renal function.  Patient's blood pressure need to be reassessed as hydralazine was added to patient's home regimen.   Discharge Diagnoses:  Principal Problem:   Pyelonephritis Active Problems:   HTN (hypertension)   GERD   Stage 3b chronic kidney disease (CKD) (HCC)   Type 2 diabetes mellitus with diabetic chronic kidney disease (HCC)   Alzheimer's dementia (HCC)   Seizure (HCC)   Constipation   General weakness   Discharge Condition: Stable and improved.  Diet recommendation: Heart healthy  Filed Weights   04/26/23 1329 04/27/23 0503 04/28/23 0433  Weight: 48.4 kg 48.4 kg 48.9 kg    History of present illness:  HPI per Dr. Bonney Leitz Brandy Gonzales is a 82 y.o. female with medical history significant for dementia, hypertension, hyperlipidemia, GERD, CKD 3B, anemia of chronic disease, DVT, glaucoma, recently admitted for new onset seizures on 04/01/2023 and discharged on 04/04/2023, during that admission she had a CT head which demonstrated calcification versus right parietal cavernosa, the patient was started on Keppra 500 mg twice daily (MRI brain was done, and was evaluated for subclinical seizure), who presents to the ED for progressive generalized weakness and abdominal pain since yesterday.  No reported nausea, vomiting, fevers, or chills.   In the ED, afebrile, with uncontrolled hypertension 200/76.  UA positive for pyuria.  CT angio chest/abdomen/pelvis for dissection with and without contrast revealed no evidence of acute aortic dissection or aneurysm, no significant stenosis, and no evidence of acute  occlusive disease.  It showed cortical hypoenhancement involving the left greater than right kidneys, finding could be secondary to pyelonephritis though infarct is also included in the differential, given appearance of superior pole left kidney.  Slightly thick-walled appearance of the urinary bladder correlate with urinalysis.  Large stool burden suggest constipation.  Moderate rectal distention by feces.     The patient was started on Rocephin empirically for bilateral pyelonephritis left greater than right.  TRH, hospitalist service, was asked to admit.   ED Course: Temperature 97.5.  BP 186/77, pulse 70, respiratory 16, saturation 98% on room air.  Lab studies notable for BUN 25, creatinine 1.21, serum glucose 174, alkaline phosphatase 157, albumin 3.0, GFR 44.  Troponin 11, 13.  Previously 7.4, hemoglobin 11.8, platelet count 197.  Hospital Course:  #1 acute bilateral pyelonephritis L > R, POA -Patient presented with generalized weakness, abdominal pain, flank pain. -Urinalysis concerning for UTI with pyuria. -CT angiogram abdomen/chest/pelvis concerning for pyelonephritis left greater than right. -Urine cultures with > 100,000 colonies of E. coli. -Patient maintained on IV Rocephin during the hospitalization and transition to cefadroxil on discharge for 5 more days to complete a 7-day course of antibiotic treatment. -Antibiotic regimen and duration discussed with ID. -Patient improved clinically and will be discharged home in stable and improved condition. -Outpatient follow-up with PCP.   2.  Recently diagnosed seizure disorder -Patient recently diagnosed with seizure disorder during last hospitalization 04/01/2023-04/04/2023. 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 -  Patient  maintained on home regimen Keppra throughout the hospitalization.  -Patient had no seizures during the hospitalization.   -Outpatient follow-up with neurology as previously scheduled.     3.  Constipation -Patient noted with significant constipation on CT scan. -Patient maintained on a bowel regimen of MiraLAX 17 g daily, Senokot-S twice daily as well as Dulcolax suppository daily with good results.   -Patient be discharged home on a bowel regimen of MiraLAX daily and Senokot S twice daily.    4.  CKD 3B -Remained stable throughout the hospitalization.    5.  Type 2 diabetes mellitus with hyperglycemia -Hemoglobin A1c 5.8 (04/13/2023) -Patient's home regimen oral hypoglycemic agents were held during the hospitalization and patient maintained on sliding scale insulin.   -Home regimen oral hypoglycemic agents will be resumed on discharge.   6.  GERD -Patient maintained on PPI.   7.  Hypertension -BP noted to be elevated during the hospitalization.   -Patient's BP still elevated despite management of patient's pain.   -Patient maintained on home regimen Coreg 25 mg twice daily and patient started on hydralazine 25 mg twice daily for better blood pressure control.   -Outpatient follow-up with PCP.    8.  Generalized weakness -PT/OT. -See problem #1. -Fall precautions. -Likely needs home health which has been ordered.   9.  Dementia -Remains stable.   10.  History of DVT -Not on anticoagulation. -Status post IVC filter  11.  Pressure injury, POA Pressure Injury 04/26/23 Buttocks Mid Deep Tissue Pressure Injury - Purple or maroon localized area of discolored intact skin or blood-filled blister due to damage of underlying soft tissue from pressure and/or shear. moisture association (Active)  04/26/23 2155  Location: Buttocks  Location Orientation: Mid  Staging: Deep Tissue Pressure Injury - Purple or maroon localized area of discolored intact skin or blood-filled blister due to  damage of underlying soft tissue from pressure and/or shear.  Wound Description (Comments): moisture association  Present on Admission: Yes        Procedures: CT angiogram chest/abdomen/pelvis 04/25/2023 CT head 04/25/2023 Chest x-ray 04/25/2023  Consultations: Curb sided ID:  Discharge Exam: Vitals:   04/28/23 0441 04/28/23 0821  BP: (!) 157/81 (!) 152/79  Pulse: 65 63  Resp: 16 16  Temp: 97.8 F (36.6 C) 97.8 F (36.6 C)  SpO2: 100% 100%    General: NAD Cardiovascular: RRR no murmurs rubs or gallops.  No JVD.  No lower extremity edema. Respiratory: Clear to auscultation bilaterally.  No wheezes, no crackles, no rhonchi.  Fair air movement.  Speaking in full sentences. GI: Abdomen is soft, nontender, nondistended, positive bowel sounds.  No rebound.  No guarding.  No CVA TTP.  Discharge Instructions   Discharge Instructions     Diet - low sodium heart healthy   Complete by: As directed    Discharge wound care:   Complete by: As directed    Pressure Injury 04/26/23 Buttocks Mid Deep Tissue Pressure Injury - Purple or maroon localized area of discolored intact skin or blood-filled blister due to damage of underlying soft tissue from pressure and/or shear. moisture association   Increase activity slowly   Complete by: As directed       Allergies as of 04/28/2023       Reactions   Amlodipine Besylate Other (See Comments)   Hair loss   Atenolol Other (See Comments)   Fluid retention   Oxycodone Hcl Other (See Comments)   Dizziness   Pork-derived Products Other (See  Comments)   Religious preference   Propoxyphene N-acetaminophen Other (See Comments)   Unknown reaction   Enalapril Maleate Palpitations        Medication List     TAKE these medications    acetaminophen 500 MG tablet Commonly known as: TYLENOL Take 2 tablets (1,000 mg total) by mouth every 8 (eight) hours as needed for mild pain, moderate pain, fever or headache.   aspirin EC 81 MG  tablet Take 81 mg by mouth daily. Swallow whole.   carvedilol 25 MG tablet Commonly known as: COREG TAKE 1 TABLET(25 MG) BY MOUTH TWICE DAILY WITH A MEAL What changed: See the new instructions.   cefadroxil 500 MG capsule Commonly known as: DURICEF Take 1 capsule (500 mg total) by mouth daily for 5 days. Start taking on: April 29, 2023   glimepiride 2 MG tablet Commonly known as: AMARYL Take 0.5 tablets (1 mg total) by mouth daily before breakfast.   Gvoke HypoPen 1-Pack 1 MG/0.2ML Soaj Generic drug: Glucagon Inject 0.2 mLs into the skin daily as needed.   hydrALAZINE 25 MG tablet Commonly known as: APRESOLINE Take 1 tablet (25 mg total) by mouth 2 (two) times daily.   levETIRAcetam 750 MG tablet Commonly known as: KEPPRA Take 1 tablet (750 mg total) by mouth 2 (two) times daily.   omeprazole 20 MG capsule Commonly known as: PRILOSEC TAKE 1 CAPSULE(20 MG) BY MOUTH DAILY   polyethylene glycol powder 17 GM/SCOOP powder Commonly known as: GLYCOLAX/MIRALAX Take 1 capful (17 g) with water by mouth daily. Start taking on: April 29, 2023   senna-docusate 8.6-50 MG tablet Commonly known as: Senokot-S Take 1 tablet by mouth 2 (two) times daily.               Discharge Care Instructions  (From admission, onward)           Start     Ordered   04/28/23 0000  Discharge wound care:       Comments: Pressure Injury 04/26/23 Buttocks Mid Deep Tissue Pressure Injury - Purple or maroon localized area of discolored intact skin or blood-filled blister due to damage of underlying soft tissue from pressure and/or shear. moisture association   04/28/23 1058           Allergies  Allergen Reactions   Amlodipine Besylate Other (See Comments)    Hair loss   Atenolol Other (See Comments)    Fluid retention   Oxycodone Hcl Other (See Comments)    Dizziness    Pork-Derived Products Other (See Comments)    Religious preference   Propoxyphene N-Acetaminophen Other  (See Comments)    Unknown reaction   Enalapril Maleate Palpitations    Follow-up Information     [name], [name], MD. Schedule an appointment as soon as possible for a visit in 2 week(s).   Specialty: Internal Medicine Contact information: [address] [place] [address] [phone]                  The results of significant diagnostics from this hospitalization (including imaging, microbiology, ancillary and laboratory) are listed below for reference.    Significant Diagnostic Studies: CT Angio Chest/Abd/Pel for Dissection W and/or Wo Contrast Result Date: 04/25/2023 CLINICAL DATA:  Dementia weakness pain EXAM: CT ANGIOGRAPHY CHEST, ABDOMEN AND PELVIS TECHNIQUE: Non-contrast CT of the chest was initially obtained. Multidetector CT imaging through the chest, abdomen and pelvis was performed using the standard protocol during bolus administration of intravenous contrast. Multiplanar reconstructed  images and MIPs were obtained and reviewed to evaluate the vascular anatomy. RADIATION DOSE REDUCTION: This exam was performed according to the departmental dose-optimization program which includes automated exposure control, adjustment of the mA and/or kV according to patient size and/or use of iterative reconstruction technique. CONTRAST:  75mL OMNIPAQUE IOHEXOL 350 MG/ML SOLN COMPARISON:  Chest x-ray 04/25/2023, CT 05/18/2022 FINDINGS: CTA CHEST FINDINGS Cardiovascular: Non contrasted images of the chest demonstrate no acute intramural hematoma. Nonaneurysmal aorta. No dissection is seen. Artifact obscures part of the aortic arch. Normal cardiac size. Coronary vascular calcification. No pericardial effusion Mediastinum/Nodes: Patent trachea. No thyroid mass. Borderline mediastinal lymph nodes. Right precarinal node measures 9 mm. Subcarinal node measures 10 mm. Multiple subcentimeter AP window nodes. Esophagus within normal limits. Lungs/Pleura: Small pleural effusions.  Dependent atelectasis at the bases. No acute airspace disease or pneumothorax. Musculoskeletal: Old appearing sternal fracture. Vertebral body heights are maintained. Review of the MIP images confirms the above findings. CTA ABDOMEN AND PELVIS FINDINGS VASCULAR Aorta: Normal caliber aorta without aneurysm, dissection, vasculitis or significant stenosis. Celiac: Patent without evidence of aneurysm, dissection, vasculitis or significant stenosis. SMA: Patent without evidence of aneurysm, dissection, vasculitis or significant stenosis. Renals: 2 right (very small accessory artery to the lower pole of the right kidney) and single left renal arteries are patent without evidence of aneurysm, dissection, vasculitis, fibromuscular dysplasia or significant stenosis. IMA: Patent without evidence of aneurysm, dissection, vasculitis or significant stenosis. Inflow: Patent without evidence of aneurysm, dissection, vasculitis or significant stenosis. Veins: Suboptimally evaluated. IVC filter with apex in the infrarenal IVC. Review of the MIP images confirms the above findings. NON-VASCULAR Hepatobiliary: No focal hepatic abnormality. Intra hepatic biliary dilatation at the right hepatic lobe was better seen on contrast enhanced examination. Distended gallbladder without calcified stone. Pancreas: Unremarkable. No pancreatic ductal dilatation or surrounding inflammatory changes. Spleen: Normal in size without focal abnormality. Adrenals/Urinary Tract: Adrenal glands are within normal limits. Renal cortical scarring. Multiple left renal cysts for which no imaging follow-up is recommended. Cortical hypoenhancement involving the left greater than right kidneys, most evident at superior pole. The bladder is slightly thick walled. Stomach/Bowel: The stomach is nonenlarged. There is no dilated small bowel. Large stool burden suggests constipation. No acute bowel wall thickening. Diverticular disease of the sigmoid colon. Moderate rectal  distension by feces with some presacral edema. Lymphatic: No suspicious lymph nodes Reproductive: Hysterectomy.  No adnexal mass Other: No free air.  Generalized subcutaneous edema Musculoskeletal: Left hip hardware. Avascular necrosis of the femoral heads without collapse. Old left inferior pubic ramus fracture. Review of the MIP images confirms the above findings. IMPRESSION: 1. Negative for acute aortic dissection or aneurysm. No significant stenosis or evidence for acute occlusive disease. 2. Small pleural effusions with dependent atelectasis at the bases. 3. Cortical hypoenhancement involving the left greater than right kidneys, findings could be secondary to pyelonephritis though infarct is also included in the differential given appearance of superior pole left kidney. 4. Slightly thick-walled appearance of urinary bladder, correlate with urinalysis. 5. Large stool burden suggests constipation. Moderate rectal distension by feces. 6. Generalized subcutaneous edema. Electronically Signed   By: Jasmine Pang M.D.   On: 04/25/2023 21:41   CT HEAD WO CONTRAST ( ) Result Date: 04/25/2023 CLINICAL DATA:  Mental status change, unknown cause EXAM: CT HEAD WITHOUT CONTRAST TECHNIQUE: Contiguous axial images were obtained from the base of the skull through the vertex without intravenous contrast. RADIATION DOSE REDUCTION: This exam was performed according to the departmental dose-optimization program which  includes automated exposure control, adjustment of the mA and/or kV according to patient size and/or use of iterative reconstruction technique. COMPARISON:  04/01/2023 FINDINGS: Brain: No evidence of acute infarction, hemorrhage, hydrocephalus, extra-axial collection or mass lesion/mass effect. Extensive low-density changes within the periventricular and subcortical white matter most compatible with chronic microvascular ischemic change. Moderate diffuse cerebral volume loss. Vascular: Atherosclerotic  calcifications involving the large vessels of the skull base. No unexpected hyperdense vessel. Skull: Normal. Negative for fracture or focal lesion. Sinuses/Orbits: No acute finding. Other: None. IMPRESSION: 1. No acute intracranial findings. 2. Advanced chronic microvascular ischemic change and cerebral volume loss. Electronically Signed   By: Duanne Guess D.O.   On: 04/25/2023 20:10   DG Chest Port 1 View Result Date: 04/25/2023 CLINICAL DATA:  Altered mental status EXAM: PORTABLE CHEST 1 VIEW COMPARISON:  Chest x-ray 10/14/2022 FINDINGS: The heart size and mediastinal contours are within normal limits. Both lungs are clear. The visualized skeletal structures are unremarkable. IMPRESSION: No active disease. Electronically Signed   By: Darliss Cheney M.D.   On: 04/25/2023 19:10   Overnight EEG with video Result Date: 04/04/2023 Charlsie Quest, MD     04/04/2023  8:10 PM Patient Name: JUWANA RADZIEWICZ MRN: [id number] Epilepsy Attending: Charlsie Quest Referring Physician/Provider: Rejeana Brock, MD Duration: 04/03/2023 1343 to 04/04/2023 4098  Patient history: 82 yo F brought in with forced L gaze, L arm and L leg rhythmic twitching. EEG to evaluate for seizure  Level of alertness: Awake, asleep  AEDs during EEG study: LEV  Technical aspects: This EEG study was done with scalp electrodes positioned according to the 10-20 International system of electrode placement. Electrical activity was reviewed with band pass filter of 1-70Hz , sensitivity of 7 uV/mm, display speed of 64mm/sec with a 60Hz  notched filter applied as appropriate. EEG data were recorded continuously and digitally stored.  Video monitoring was available and reviewed as appropriate.  Description:  The posterior dominant rhythm consists of 7 Hz activity of moderate voltage (25-35 uV) seen predominantly in posterior head regions, symmetric and reactive to eye opening and eye closing. Sleep was characterized by vertex waves, sleep  spindles (12 to 14 Hz), maximal frontocentral region. EEG showed continuous generalized 3 to 6 Hz theta-delta slowing. Lateralized periodic discharges were noted arising from right temporoparietal region with fluctuating frequency of 0.5-4Hz , at times with overriding fast activity. Hyperventilation and photic stimulation were not performed.    ABNORMALITY - Lateralized periodic discharges, right temporoparietal region - Continuous slow, generalized  IMPRESSION: This study showed evidence of epileptogenicity arising from right temporoparietal region. This EEG pattern is on the ictal-interictal region with increased risk of seizure recurrence. Additionally there is moderate diffuse encephalopathy. No definite seizures were seen throughout the recording.  Charlsie Quest   MR BRAIN WO CONTRAST Result Date: 04/03/2023 CLINICAL DATA:  Seizure, new onset, no history of trauma. EXAM: MRI HEAD WITHOUT CONTRAST TECHNIQUE: Multiplanar, multiecho pulse sequences of the brain and surrounding structures were obtained without intravenous contrast. COMPARISON:  CT studies 2 days ago.  MRI 11/16/2021. FINDINGS: Brain: Diffusion imaging does not show any acute or subacute infarction or other cause of restricted diffusion. Chronic small-vessel ischemic changes are seen affecting the pons. There are numerous old small vessel infarctions of the cerebellum. Cerebral hemispheres show advanced chronic small-vessel ischemic changes affecting the thalami, basal ganglia and throughout the cerebral hemispheric deep and subcortical white matter. Old cortical infarctions are seen in the left frontal lobe and right parietal lobe.  Chronic foci of hemosiderin deposition/susceptibility artifact Prevident in the parieto-occipital junction regions on both sides. 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 No evidence of new hemorrhage since the prior studies. No  evidence of mass, hydrocephalus or extra-axial collection. Ventricular prominence is in proportion to the degree of deep white matter disease. Vascular: Major vessels at the base of the brain show flow. Skull and upper cervical spine: Negative Sinuses/Orbits: Clear/normal Other: None IMPRESSION: 1. No acute finding by MRI. Advanced chronic small-vessel ischemic changes of the pons, cerebellum, thalami, basal ganglia and cerebral hemispheric white matter. 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 2. Chronic foci of hemosiderin deposition/susceptibility artifact in the parieto-occipital junction regions on both sides. 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 No evidence of new hemorrhage since the prior studies. Electronically Signed   By: Paulina Fusi M.D.   On: 04/03/2023 11:55   Overnight EEG with video Result Date: 04/02/2023 Charlsie Quest, MD     04/02/2023  6:26 PM Patient Name: JENASCIA BRUNE MRN: [id number] Epilepsy Attending: Charlsie Quest Referring Physician/Provider: Erick Blinks, MD Duration: 04/01/2023 1403 to 04/02/2023 1403 Patient history: 82 yo F brought in with forced L gaze, L arm and L leg rhythmic twitching. EEG to evaluate for seizure Level of alertness: Awake, asleep AEDs during EEG study: LEV Technical aspects: This EEG study was done with scalp electrodes positioned according to the 10-20 International system of electrode placement. Electrical activity was reviewed with band pass filter of 1-70Hz , sensitivity of 7 uV/mm, display speed of 39mm/sec with a 60Hz  notched filter applied as appropriate. EEG data were recorded continuously and digitally stored.  Video monitoring was available and reviewed as appropriate. Description: At the beginning of the study, EEG showed sharply contoured 5 to 6 Hz theta slowing in right hemisphere, maximal right temporoparietal region  admixed with sharp waves.  Gradually EEG evolved into 2 to 3 Hz delta slowing.  No clinical signs were noted on video.  This EEG pattern was consistent with focal electrographic status epilepticus.  As antiseizure medications were adjusted, status epilepticus resolved on 04/01/2023 at 1411. Subsequently EEG showed continuous generalized 3-7hz  theta-delta slowing.  Gradually EEG improved and showed posterior dominant rhythm of 6 Hz activity of moderate voltage (25-35 uV) seen predominantly in posterior head regions, symmetric and reactive to eye opening and eye closing. Sleep was characterized by vertex waves, sleep spindles (12 to 14 Hz), maximal frontocentral region. Hyperventilation and photic stimulation were not performed.   ABNORMALITY -Electrographic status epilepticus, right temporoparietal region - Continuous slow, generalized IMPRESSION: At the beginning of the study, EEG was consistent with electrographic status epilepticus arising from right temporoparietal region.  As anti-seizure medications were adjusted, status epilepticus resolved on 04/01/2023 at 1411.  Subsequently EEG was suggestive of moderate diffuse encephalopathy. Charlsie Quest   CT VENOGRAM HEAD Result Date: 04/01/2023 CLINICAL DATA:  Code stroke.  Subcortical high density by head CT. EXAM: CT VENOGRAM HEAD TECHNIQUE: Venographic phase images of the brain were obtained following the administration of intravenous contrast. Multiplanar reformats and maximum intensity projections were generated. RADIATION DOSE REDUCTION: This exam was performed according to the departmental dose-optimization program which includes automated exposure control, adjustment of the mA and/or kV according to patient size and/or use of iterative reconstruction technique. CONTRAST:  75mL OMNIPAQUE IOHEXOL 350  MG/ML SOLN COMPARISON:  Preceding head CT. FINDINGS: The dural venous sinuses are diffusely patent no stricture or diverticulum seen. No evidence of central  or cortical venous thrombosis. The subcortical high density in the right parietal region is again noted without convincing adjacent enhancement. No abnormal parenchymal enhancement seen throughout the brain. IMPRESSION: No dural venous sinus thrombosis or other acute venous finding. Electronically Signed   By: Tiburcio Pea M.D.   On: 04/01/2023 13:10   CT ANGIO HEAD NECK W WO CM (CODE STROKE) Result Date: 04/01/2023 CLINICAL DATA:  Assess intracranial arteries. EXAM: CT ANGIOGRAPHY HEAD AND NECK WITH AND WITHOUT CONTRAST TECHNIQUE: Multidetector CT imaging of the head and neck was performed using the standard protocol during bolus administration of intravenous contrast. Multiplanar CT image reconstructions and MIPs were obtained to evaluate the vascular anatomy. Carotid stenosis measurements (when applicable) are obtained utilizing NASCET criteria, using the distal internal carotid diameter as the denominator. RADIATION DOSE REDUCTION: This exam was performed according to the departmental dose-optimization program which includes automated exposure control, adjustment of the mA and/or kV according to patient size and/or use of iterative reconstruction technique. CONTRAST:  75 cc Omnipaque 350 intravenous COMPARISON:  Head CT from earlier today. CT of the head neck 04/27/2022 FINDINGS: CTA NECK FINDINGS Aortic arch: Atheromatous calcification with 3 vessel branching. Right carotid system: Atheromatous plaque at the proximal ICA is mild. No flow reducing stenosis Left carotid system: Vessels are smoothly contoured and widely patent with less than typical atheromatous change Vertebral arteries: No proximal subclavian or vertebral stenosis. No beading or dissection. Skeleton: Negative. Other neck: Negative. Upper chest: Negative Review of the MIP images confirms the above findings CTA HEAD FINDINGS Anterior circulation: No significant stenosis, proximal occlusion, aneurysm, or vascular malformation. Atheromatous  calcification of the carotid siphons. Posterior circulation: The vertebral and basilar arteries are smoothly contoured and diffusely patent. Atheromatous irregularity of the posterior cerebral arteries asymmetric to the left but mild. No proximal flow reducing stenosis. Venous sinuses: Unremarkable Anatomic variants: Unremarkable Review of the MIP images confirms the above findings IMPRESSION: 1. No emergent finding including large vessel occlusion. 2. Mild for age atherosclerosis. No flow reducing stenosis or irregularity of major arteries in the head and neck. Electronically Signed   By: Tiburcio Pea M.D.   On: 04/01/2023 13:07   CT HEAD CODE STROKE WO CONTRAST Result Date: 04/01/2023 CLINICAL DATA:  Code stroke.  Unresponsive EXAM: CT HEAD WITHOUT CONTRAST TECHNIQUE: Contiguous axial images were obtained from the base of the skull through the vertex without intravenous contrast. RADIATION DOSE REDUCTION: This exam was performed according to the departmental dose-optimization program which includes automated exposure control, adjustment of the mA and/or kV according to patient size and/or use of iterative reconstruction technique. COMPARISON:  04/27/2022 brain MRI FINDINGS: Brain: No evidence of acute infarction, hemorrhage, hydrocephalus, extra-axial collection or mass lesion/mass effect. Chronic small vessel ischemic gliosis in the cerebral white matter that is confluent. Small chronic left cerebellar infarct also seen on prior brain MRI. There is cerebral volume loss with ventriculomegaly. High-density in the subcortical right parietal lobe measuring 6 mm, stable based on prior reformats and with T1 hyperintensity based on prior brain MRI, possible cavernoma. Vascular: No hyperdense vessel or unexpected calcification. Skull: Normal. Negative for fracture or focal lesion. Sinuses/Orbits: No acute finding. Other: Prelim sent in epic chat. ASPECTS [facility] Stroke Program Early CT Score) Not scored with this  history IMPRESSION: 1. No acute finding. 2. Extensive chronic small vessel disease. 3. Chronic mineralization  along the right parietal cortex, possible cavernoma. Electronically Signed   By: Tiburcio Pea M.D.   On: 04/01/2023 12:50    Microbiology: Recent Results (from the past 240 hours)  Urine Culture     Status: Abnormal   Collection Time: 04/25/23 10:05 PM   Specimen: Urine, Random  Result Value Ref Range Status   Specimen Description URINE, RANDOM  Final   Special Requests   Final    NONE Reflexed from [id number] Performed at [facility], [address] [place], [place] [address]    Culture >=100,000 COLONIES/mL ESCHERICHIA COLI (A)  Final   Report Status 04/28/2023 FINAL  Final   Organism ID, Bacteria ESCHERICHIA COLI (A)  Final      Susceptibility   Escherichia coli - MIC*    AMPICILLIN >=32 RESISTANT Resistant     CEFAZOLIN <=4 SENSITIVE Sensitive     CEFEPIME <=0.12 SENSITIVE Sensitive     CEFTRIAXONE <=0.25 SENSITIVE Sensitive     CIPROFLOXACIN <=0.25 SENSITIVE Sensitive     GENTAMICIN <=1 SENSITIVE Sensitive     IMIPENEM <=0.25 SENSITIVE Sensitive     NITROFURANTOIN <=16 SENSITIVE Sensitive     TRIMETH/SULFA >=320 RESISTANT Resistant     AMPICILLIN/SULBACTAM 16 INTERMEDIATE Intermediate     PIP/TAZO <=4 SENSITIVE Sensitive ug/mL    * >=100,000 COLONIES/mL ESCHERICHIA COLI  Culture, blood (Routine X 2) w Reflex to ID Panel     Status: None (Preliminary result)   Collection Time: 04/26/23 11:17 AM   Specimen: BLOOD  Result Value Ref Range Status   Specimen Description BLOOD BLOOD RIGHT HAND  Final   Special Requests   Final    AEROBIC BOTTLE ONLY Blood Culture results may not be optimal due to an inadequate volume of blood received in culture bottles   Culture   Final    NO GROWTH 2 DAYS Performed at [facility], [address] [place], [place] [address]    Report Status PENDING  Incomplete  Culture, blood (Routine X 2) w Reflex to ID Panel      Status: None (Preliminary result)   Collection Time: 04/26/23 11:17 AM   Specimen: BLOOD  Result Value Ref Range Status   Specimen Description BLOOD BLOOD LEFT ARM  Final   Special Requests   Final    AEROBIC BOTTLE ONLY Blood Culture results may not be optimal due to an inadequate volume of blood received in culture bottles   Culture   Final    NO GROWTH 2 DAYS Performed at [facility], [address], [place], [place] [address]    Report Status PENDING  Incomplete     Labs: Basic Metabolic Panel: Recent Labs  Lab 04/25/23 1826 04/26/23 0540 04/27/23 0630 04/28/23 0706  NA 145 141 145 141  K 4.1 4.0 4.3 4.5  CL 113* 115* 113* 110  CO2 22 22 22  20*  GLUCOSE 174* 266* 125* 169*  BUN 25* 23 22 26*  CREATININE 1.21* 1.19* 1.10* 1.40*  CALCIUM 9.9 9.1 9.6 9.3  MG 1.8 1.9 1.7 2.3  PHOS  --  2.9  --   --    Liver Function Tests: Recent Labs  Lab 04/25/23 1826 04/26/23 0540  AST 34 32  ALT 35 34  ALKPHOS 157* 125  BILITOT 1.1 0.6  PROT 5.5* 5.0*  ALBUMIN 3.0* 2.7*   No results for input(s): "LIPASE", "AMYLASE" in the last 168 hours. Recent Labs  Lab 04/25/23 1826  AMMONIA 17  CBC: Recent Labs  Lab 04/25/23 1826 04/26/23 0540 04/27/23 0827 04/28/23 0706  WBC 7.4 5.3 6.6 6.0  NEUTROABS 4.9  --  3.4 3.2  HGB 11.8* 10.1* 11.6* 10.5*  HCT 35.2* 30.2* 35.2* 31.3*  MCV 94.1 94.4 95.1 93.7  PLT 197 169 153 149*   Cardiac Enzymes: No results for input(s): "CKTOTAL", "CKMB", "CKMBINDEX", "TROPONINI" in the last 168 hours. BNP: BNP (last 3 results) Recent Labs    08/07/22 1625 10/15/22 0936  BNP 354.8* 162.0*    ProBNP (last 3 results) No results for input(s): "PROBNP" in the last 8760 hours.  CBG: Recent Labs  Lab 04/27/23 0837 04/27/23 1204 04/27/23 1633 04/27/23 2018 04/28/23 0821  GLUCAP 123* 178* 227* 200* 154*       Signed:  Ramiro Harvest MD.  [facility] 04/28/2023, 11:08 AM

## 2023-04-29 ENCOUNTER — Telehealth: Payer: Self-pay

## 2023-04-29 NOTE — Transitions of Care (Post Inpatient/ED Visit) (Signed)
   04/29/2023  Name: Brandy Gonzales MRN: [id number] DOB: 1941-03-11  Today's TOC FU Call Status: Today's TOC FU Call Status:: Unsuccessful Call (1st Attempt) Unsuccessful Call (1st Attempt) Date: 04/29/23  Attempted to reach the patient regarding the most recent Inpatient/ED visit.  Follow Up Plan: Additional outreach attempts will be made to reach the patient to complete the Transitions of Care (Post Inpatient/ED visit) call.   Signature Karena Addison, LPN [facility] Nurse Health Advisor Direct Dial [phone]

## 2023-04-30 ENCOUNTER — Other Ambulatory Visit: Payer: Self-pay

## 2023-04-30 ENCOUNTER — Inpatient Hospital Stay (HOSPITAL_COMMUNITY)
Admission: EM | Admit: 2023-04-30 | Discharge: 2023-05-03 | DRG: 305 | Disposition: A | Discharge status: Home Health | Payer: Medicare Other | Attending: Family Medicine | Admitting: Family Medicine

## 2023-04-30 ENCOUNTER — Encounter (HOSPITAL_COMMUNITY): Payer: Self-pay

## 2023-04-30 ENCOUNTER — Emergency Department (HOSPITAL_COMMUNITY): Payer: Medicare Other

## 2023-04-30 ENCOUNTER — Observation Stay (HOSPITAL_COMMUNITY): Payer: Medicare Other

## 2023-04-30 DIAGNOSIS — Z833 Family history of diabetes mellitus: Secondary | ICD-10-CM

## 2023-04-30 DIAGNOSIS — N12 Tubulo-interstitial nephritis, not specified as acute or chronic: Secondary | ICD-10-CM | POA: Diagnosis present

## 2023-04-30 DIAGNOSIS — I129 Hypertensive chronic kidney disease with stage 1 through stage 4 chronic kidney disease, or unspecified chronic kidney disease: Secondary | ICD-10-CM | POA: Diagnosis present

## 2023-04-30 DIAGNOSIS — D631 Anemia in chronic kidney disease: Secondary | ICD-10-CM | POA: Diagnosis present

## 2023-04-30 DIAGNOSIS — M546 Pain in thoracic spine: Secondary | ICD-10-CM | POA: Diagnosis present

## 2023-04-30 DIAGNOSIS — R5381 Other malaise: Secondary | ICD-10-CM | POA: Diagnosis not present

## 2023-04-30 DIAGNOSIS — K219 Gastro-esophageal reflux disease without esophagitis: Secondary | ICD-10-CM | POA: Diagnosis present

## 2023-04-30 DIAGNOSIS — Z91014 Allergy to mammalian meats: Secondary | ICD-10-CM | POA: Diagnosis not present

## 2023-04-30 DIAGNOSIS — Z800 Family history of malignant neoplasm of digestive organs: Secondary | ICD-10-CM

## 2023-04-30 DIAGNOSIS — Z841 Family history of disorders of kidney and ureter: Secondary | ICD-10-CM

## 2023-04-30 DIAGNOSIS — M545 Low back pain, unspecified: Secondary | ICD-10-CM

## 2023-04-30 DIAGNOSIS — R609 Edema, unspecified: Secondary | ICD-10-CM | POA: Diagnosis not present

## 2023-04-30 DIAGNOSIS — Z7984 Long term (current) use of oral hypoglycemic drugs: Secondary | ICD-10-CM

## 2023-04-30 DIAGNOSIS — Z888 Allergy status to other drugs, medicaments and biological substances status: Secondary | ICD-10-CM

## 2023-04-30 DIAGNOSIS — Z79899 Other long term (current) drug therapy: Secondary | ICD-10-CM

## 2023-04-30 DIAGNOSIS — Z7401 Bed confinement status: Secondary | ICD-10-CM

## 2023-04-30 DIAGNOSIS — Z803 Family history of malignant neoplasm of breast: Secondary | ICD-10-CM

## 2023-04-30 DIAGNOSIS — Z86718 Personal history of other venous thrombosis and embolism: Secondary | ICD-10-CM

## 2023-04-30 DIAGNOSIS — D649 Anemia, unspecified: Secondary | ICD-10-CM | POA: Diagnosis present

## 2023-04-30 DIAGNOSIS — I161 Hypertensive emergency: Secondary | ICD-10-CM | POA: Diagnosis not present

## 2023-04-30 DIAGNOSIS — Z0389 Encounter for observation for other suspected diseases and conditions ruled out: Secondary | ICD-10-CM | POA: Diagnosis not present

## 2023-04-30 DIAGNOSIS — R079 Chest pain, unspecified: Secondary | ICD-10-CM | POA: Diagnosis not present

## 2023-04-30 DIAGNOSIS — Z8249 Family history of ischemic heart disease and other diseases of the circulatory system: Secondary | ICD-10-CM

## 2023-04-30 DIAGNOSIS — E1140 Type 2 diabetes mellitus with diabetic neuropathy, unspecified: Secondary | ICD-10-CM | POA: Diagnosis present

## 2023-04-30 DIAGNOSIS — G40909 Epilepsy, unspecified, not intractable, without status epilepticus: Secondary | ICD-10-CM | POA: Diagnosis present

## 2023-04-30 DIAGNOSIS — N390 Urinary tract infection, site not specified: Secondary | ICD-10-CM | POA: Diagnosis not present

## 2023-04-30 DIAGNOSIS — R7989 Other specified abnormal findings of blood chemistry: Secondary | ICD-10-CM | POA: Diagnosis not present

## 2023-04-30 DIAGNOSIS — F0280 Dementia in other diseases classified elsewhere without behavioral disturbance: Secondary | ICD-10-CM | POA: Diagnosis present

## 2023-04-30 DIAGNOSIS — G309 Alzheimer's disease, unspecified: Secondary | ICD-10-CM | POA: Diagnosis present

## 2023-04-30 DIAGNOSIS — R54 Age-related physical debility: Secondary | ICD-10-CM | POA: Diagnosis present

## 2023-04-30 DIAGNOSIS — Z7982 Long term (current) use of aspirin: Secondary | ICD-10-CM | POA: Diagnosis not present

## 2023-04-30 DIAGNOSIS — R109 Unspecified abdominal pain: Secondary | ICD-10-CM | POA: Diagnosis not present

## 2023-04-30 DIAGNOSIS — E119 Type 2 diabetes mellitus without complications: Secondary | ICD-10-CM | POA: Diagnosis not present

## 2023-04-30 DIAGNOSIS — I160 Hypertensive urgency: Secondary | ICD-10-CM | POA: Diagnosis not present

## 2023-04-30 DIAGNOSIS — Z885 Allergy status to narcotic agent status: Secondary | ICD-10-CM | POA: Diagnosis not present

## 2023-04-30 DIAGNOSIS — Z993 Dependence on wheelchair: Secondary | ICD-10-CM

## 2023-04-30 DIAGNOSIS — Z87448 Personal history of other diseases of urinary system: Secondary | ICD-10-CM | POA: Diagnosis present

## 2023-04-30 DIAGNOSIS — R918 Other nonspecific abnormal finding of lung field: Secondary | ICD-10-CM | POA: Diagnosis not present

## 2023-04-30 DIAGNOSIS — N1832 Chronic kidney disease, stage 3b: Secondary | ICD-10-CM | POA: Diagnosis present

## 2023-04-30 DIAGNOSIS — K5730 Diverticulosis of large intestine without perforation or abscess without bleeding: Secondary | ICD-10-CM | POA: Diagnosis not present

## 2023-04-30 DIAGNOSIS — E1122 Type 2 diabetes mellitus with diabetic chronic kidney disease: Secondary | ICD-10-CM | POA: Diagnosis present

## 2023-04-30 DIAGNOSIS — Z8049 Family history of malignant neoplasm of other genital organs: Secondary | ICD-10-CM | POA: Diagnosis not present

## 2023-04-30 DIAGNOSIS — M7989 Other specified soft tissue disorders: Secondary | ICD-10-CM | POA: Diagnosis not present

## 2023-04-30 DIAGNOSIS — Z01818 Encounter for other preprocedural examination: Secondary | ICD-10-CM | POA: Diagnosis not present

## 2023-04-30 LAB — CBC
HCT: 34.4 % — ABNORMAL LOW (ref 36.0–46.0)
Hemoglobin: 11.4 g/dL — ABNORMAL LOW (ref 12.0–15.0)
MCH: 31.7 pg (ref 26.0–34.0)
MCHC: 33.1 g/dL (ref 30.0–36.0)
MCV: 95.6 fL (ref 80.0–100.0)
Platelets: 190 10*3/uL (ref 150–400)
RBC: 3.6 MIL/uL — ABNORMAL LOW (ref 3.87–5.11)
RDW: 13.2 % (ref 11.5–15.5)
WBC: 5.9 10*3/uL (ref 4.0–10.5)
nRBC: 0 % (ref 0.0–0.2)

## 2023-04-30 LAB — BASIC METABOLIC PANEL
Anion gap: 11 (ref 5–15)
BUN: 25 mg/dL — ABNORMAL HIGH (ref 8–23)
CO2: 24 mmol/L (ref 22–32)
Calcium: 9.9 mg/dL (ref 8.9–10.3)
Chloride: 108 mmol/L (ref 98–111)
Creatinine, Ser: 1.32 mg/dL — ABNORMAL HIGH (ref 0.44–1.00)
GFR, Estimated: 40 mL/min — ABNORMAL LOW (ref >60)
Glucose, Bld: 140 mg/dL — ABNORMAL HIGH (ref 70–99)
Potassium: 4.1 mmol/L (ref 3.5–5.1)
Sodium: 143 mmol/L (ref 135–145)

## 2023-04-30 LAB — TROPONIN I (HIGH SENSITIVITY)
Troponin I (High Sensitivity): 42 ng/L — ABNORMAL HIGH (ref <18)
Troponin I (High Sensitivity): 40 ng/L — ABNORMAL HIGH (ref <18)

## 2023-04-30 LAB — D-DIMER, QUANTITATIVE: D-Dimer, Quant: 5.8 ug{FEU}/mL — ABNORMAL HIGH (ref 0.00–0.50)

## 2023-04-30 LAB — BRAIN NATRIURETIC PEPTIDE: B Natriuretic Peptide: 238.6 pg/mL — ABNORMAL HIGH (ref 0.0–100.0)

## 2023-04-30 MED ORDER — CARVEDILOL 12.5 MG PO TABS
12.5000 mg | ORAL_TABLET | Freq: Two times a day (BID) | ORAL | Status: DC
  Administered 2023-04-30 – 2023-05-03 (×6): 12.5 mg via ORAL
  Filled 2023-04-30 (×6): qty 1

## 2023-04-30 MED ORDER — SODIUM CHLORIDE 0.9% FLUSH
3.0000 mL | Freq: Two times a day (BID) | INTRAVENOUS | Status: DC
  Administered 2023-04-30 – 2023-05-02 (×4): 3 mL via INTRAVENOUS

## 2023-04-30 MED ORDER — SMOG ENEMA
200.0000 mL | Freq: Once | RECTAL | Status: AC
  Administered 2023-04-30: 200 mL via RECTAL
  Filled 2023-04-30: qty 960

## 2023-04-30 MED ORDER — NITROGLYCERIN IN D5W 200-5 MCG/ML-% IV SOLN
0.0000 ug/min | INTRAVENOUS | Status: DC
Start: 2023-04-30 — End: 2023-05-03
  Administered 2023-04-30: 5 ug/min via INTRAVENOUS
  Filled 2023-04-30: qty 250

## 2023-04-30 MED ORDER — METOPROLOL TARTRATE 5 MG/5ML IV SOLN
5.0000 mg | Freq: Once | INTRAVENOUS | Status: AC
  Administered 2023-04-30: 5 mg via INTRAVENOUS
  Filled 2023-04-30: qty 5

## 2023-04-30 MED ORDER — SENNOSIDES-DOCUSATE SODIUM 8.6-50 MG PO TABS
1.0000 | ORAL_TABLET | Freq: Two times a day (BID) | ORAL | Status: DC
Start: 2023-04-30 — End: 2023-05-03
  Administered 2023-04-30 – 2023-05-03 (×6): 1 via ORAL
  Filled 2023-04-30 (×6): qty 1

## 2023-04-30 MED ORDER — SENNOSIDES-DOCUSATE SODIUM 8.6-50 MG PO TABS
1.0000 | ORAL_TABLET | Freq: Once | ORAL | Status: AC
  Administered 2023-04-30: 1 via ORAL
  Filled 2023-04-30: qty 1

## 2023-04-30 MED ORDER — ONDANSETRON HCL 4 MG PO TABS: 4.0000 mg | ORAL_TABLET | Freq: Four times a day (QID) | ORAL | Status: DC | PRN

## 2023-04-30 MED ORDER — ASPIRIN 81 MG PO TBEC
81.0000 mg | DELAYED_RELEASE_TABLET | Freq: Every day | ORAL | Status: DC
  Administered 2023-04-30 – 2023-05-03 (×4): 81 mg via ORAL
  Filled 2023-04-30 (×4): qty 1

## 2023-04-30 MED ORDER — CARVEDILOL 12.5 MG PO TABS: 12.5000 mg | ORAL_TABLET | Freq: Two times a day (BID) | ORAL | Status: DC

## 2023-04-30 MED ORDER — PANTOPRAZOLE SODIUM 40 MG PO TBEC
40.0000 mg | DELAYED_RELEASE_TABLET | Freq: Every day | ORAL | Status: DC
Start: 2023-04-30 — End: 2023-05-03
  Administered 2023-04-30 – 2023-05-03 (×4): 40 mg via ORAL
  Filled 2023-04-30 (×4): qty 1

## 2023-04-30 MED ORDER — CEFADROXIL 500 MG PO CAPS
500.0000 mg | ORAL_CAPSULE | Freq: Every day | ORAL | Status: AC
  Administered 2023-04-30 – 2023-05-03 (×4): 500 mg via ORAL
  Filled 2023-04-30 (×4): qty 1

## 2023-04-30 MED ORDER — HYDRALAZINE HCL 20 MG/ML IJ SOLN
10.0000 mg | INTRAMUSCULAR | Status: DC | PRN
  Administered 2023-05-01 – 2023-05-02 (×2): 10 mg via INTRAVENOUS
  Filled 2023-04-30 (×3): qty 1

## 2023-04-30 MED ORDER — LEVETIRACETAM 750 MG PO TABS
750.0000 mg | ORAL_TABLET | Freq: Two times a day (BID) | ORAL | Status: DC
  Administered 2023-04-30 – 2023-05-03 (×7): 750 mg via ORAL
  Filled 2023-04-30 (×8): qty 1

## 2023-04-30 MED ORDER — ALBUTEROL SULFATE (2.5 MG/3ML) 0.083% IN NEBU: 2.5000 mg | INHALATION_SOLUTION | Freq: Four times a day (QID) | RESPIRATORY_TRACT | Status: DC | PRN

## 2023-04-30 MED ORDER — HYDRALAZINE HCL 25 MG PO TABS
25.0000 mg | ORAL_TABLET | Freq: Two times a day (BID) | ORAL | Status: DC
  Administered 2023-04-30 – 2023-05-01 (×3): 25 mg via ORAL
  Filled 2023-04-30 (×3): qty 1

## 2023-04-30 MED ORDER — SENNOSIDES-DOCUSATE SODIUM 8.6-50 MG PO TABS: 1.0000 | ORAL_TABLET | Freq: Every evening | ORAL | Status: DC | PRN

## 2023-04-30 MED ORDER — CARVEDILOL 12.5 MG PO TABS: 25.0000 mg | ORAL_TABLET | Freq: Two times a day (BID) | ORAL | Status: DC

## 2023-04-30 MED ORDER — ONDANSETRON HCL 4 MG/2ML IJ SOLN: 4.0000 mg | Freq: Four times a day (QID) | INTRAMUSCULAR | Status: DC | PRN
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
## 2023-04-30 NOTE — ED Provider Notes (Addendum)
[facility] DEPARTMENT AT [facility] Provider Note   CSN: [id number] Arrival date & time: 04/30/23  7829     History  Chief Complaint  Patient presents with   Chest Pain    Brandy Gonzales is a 82 y.o. female history of recent pyelonephritis requiring admission, Bell's palsy, hypertension, diabetic neuropathy, CKD stage IIIb, DVT, diabetes Alzheimer's presented for chest pain.  Patient states his chest pain is been present since before she was admitted on the 15th for pyelonephritis.  Patient dates that chest pain is worse when moving but denies back pain, left arm or left jaw pain, abdominal pain, nauseous vomiting, shortness of breath, leg swelling, vision changes, headache.  Patient was discharged on antibiotics for her kidney infection but is not been able to pick them up.  Patient is unsure if she is taking her other medications.  Home Medications Prior to Admission medications   Medication Sig Start Date End Date Taking? Authorizing Provider  aspirin EC 81 MG tablet Take 81 mg by mouth daily. Swallow whole.   Yes [provider]  carvedilol (COREG) 25 MG tablet TAKE 1 TABLET(25 MG) BY MOUTH TWICE DAILY WITH A MEAL Patient taking differently: Take 25 mg by mouth daily. 03/18/23  Yes [name], [name], MD  cefadroxil (DURICEF) 500 MG capsule Take 1 capsule (500 mg total) by mouth daily for 5 days. 04/29/23 05/04/23 Yes [name], MD  glimepiride (AMARYL) 2 MG tablet Take 0.5 tablets (1 mg total) by mouth daily before breakfast. 02/09/23  Yes [name], [name], MD  Glucagon (GVOKE HYPOPEN 1-PACK) 1 MG/0.2ML SOAJ Inject 0.2 mLs into the skin daily as needed. 02/09/23  Yes [name], [name], MD  levETIRAcetam (KEPPRA) 750 MG tablet Take 1 tablet (750 mg total) by mouth 2 (two) times daily. 04/04/23 06/03/23 Yes Zigmund Daniel., MD  omeprazole (PRILOSEC) 20 MG capsule TAKE 1 CAPSULE(20 MG) BY MOUTH DAILY 03/18/23  Yes [name], [name], MD   acetaminophen (TYLENOL) 500 MG tablet Take 500-1,000 mg by mouth every 6 (six) hours as needed for moderate pain (pain score 4-6). Patient not taking: Reported on 04/30/2023    [provider]  hydrALAZINE (APRESOLINE) 25 MG tablet Take 1 tablet (25 mg total) by mouth 2 (two) times daily. Patient not taking: Reported on 04/30/2023 04/28/23   [name], MD  polyethylene glycol powder [employer]) 17 GM/SCOOP powder Take 1 capful (17 g) with water by mouth daily. Patient not taking: Reported on 04/30/2023 04/29/23   [name], MD  senna-docusate (SENOKOT-S) 8.6-50 MG tablet Take 1 tablet by mouth 2 (two) times daily. Patient not taking: Reported on 04/30/2023 04/28/23   [name], MD      Allergies    Amlodipine besylate, Atenolol, Oxycodone hcl, Pork-derived products, Propoxyphene n-acetaminophen, and Enalapril maleate    Review of Systems   Review of Systems  Cardiovascular:  Positive for chest pain.    Physical Exam Updated Vital Signs BP (!) 200/75   Pulse (!) 57   Temp (!) 97.5 F (36.4 C) (Oral)   Resp (!) 9   Ht 4\' 11"  (1.499 m)   Wt 48.9 kg   SpO2 98%   BMI 21.77 kg/m  Physical Exam Constitutional:      General: She is not in acute distress. Cardiovascular:     Rate and Rhythm: Normal rate and regular rhythm.     Pulses: Normal pulses.     Heart sounds: Normal heart  sounds.  Pulmonary:     Effort: Pulmonary effort is normal. No respiratory distress.     Breath sounds: Normal breath sounds.     Comments: Able to speak in full sentences Chest:     Chest wall: No tenderness.  Musculoskeletal:     Right lower leg: No tenderness. No edema.     Left lower leg: No tenderness. No edema.  Skin:    General: Skin is warm and dry.  Neurological:     Mental Status: She is alert.  Psychiatric:        Mood and Affect: Mood normal.     ED Results / Procedures / Treatments   Labs (all labs ordered are listed, but only abnormal  results are displayed) Labs Reviewed  BASIC METABOLIC PANEL - Abnormal; Notable for the following components:      Result Value   Glucose, Bld 140 (*)    BUN 25 (*)    Creatinine, Ser 1.32 (*)    GFR, Estimated 40 (*)    All other components within normal limits  CBC - Abnormal; Notable for the following components:   RBC 3.60 (*)    Hemoglobin 11.4 (*)    HCT 34.4 (*)    All other components within normal limits  TROPONIN I (HIGH SENSITIVITY) - Abnormal; Notable for the following components:   Troponin I (High Sensitivity) 42 (*)    All other components within normal limits  TROPONIN I (HIGH SENSITIVITY)    EKG EKG Interpretation Date/Time:  Friday April 30 2023 09:12:38 EST Ventricular Rate:  65 PR Interval:  145 QRS Duration:  82 QT Interval:  417 QTC Calculation: 434 R Axis:   3  Text Interpretation: Sinus rhythm Abnormal R-wave progression, early transition Borderline repolarization abnormality Confirmed by Edwin Dada ([id number]) on 04/30/2023 9:18:44 AM  Radiology DG Chest Port 1 View Result Date: 04/30/2023 CLINICAL DATA:  Chest pain, recent urinary tract infection EXAM: PORTABLE CHEST 1 VIEW COMPARISON:  04/25/2023 FINDINGS: Single frontal view of the chest demonstrates a stable cardiac silhouette. No airspace disease, effusion, or pneumothorax. No acute bony abnormalities. IMPRESSION: 1. No acute intrathoracic process. Electronically Signed   By: Sharlet Salina M.D.   On: 04/30/2023 09:31    Procedures .Critical Care  Performed by: Netta Corrigan, PA-C Authorized by: Netta Corrigan, PA-C   Critical care provider statement:    Critical care time (minutes):  30   Critical care time was exclusive of:  Separately billable procedures and treating other patients   Critical care was necessary to treat or prevent imminent or life-threatening deterioration of the following conditions: HTN emergency.   Critical care was time spent personally by me on the following  activities:  Blood draw for specimens, development of treatment plan with patient or surrogate, discussions with consultants, evaluation of patient's response to treatment, examination of patient, obtaining history from patient or surrogate, review of old charts, re-evaluation of patient's condition, pulse oximetry, ordering and review of radiographic studies, ordering and review of laboratory studies and ordering and performing treatments and interventions   I assumed direction of critical care for this patient from another provider in my specialty: no     Care discussed with: admitting provider       Medications Ordered in ED Medications  nitroGLYCERIN 50 mg in dextrose 5 % 250 mL (0.2 mg/mL) infusion (has no administration in time range)  metoprolol tartrate (LOPRESSOR) injection 5 mg (5 mg Intravenous Given 04/30/23 1033)  ED Course/ Medical Decision Making/ A&P             HEART Score: 5                    Medical Decision Making Amount and/or Complexity of Data Reviewed Labs: ordered. Radiology: ordered.  Risk Prescription drug management. Decision regarding hospitalization.   Brandy Gonzales 82 y.o. presented today for chest pain. Working DDx that I considered at this time includes, but not limited to, ACS, GERD, pulmonary embolism, community-acquired pneumonia, aortic dissection, pneumothorax, underlying bony abnormality, anemia, thyrotoxicosis, esophageal rupture, CHF exacerbation, valvular disorder, myocarditis, pericarditis, endocarditis, pericardial effusion/cardiac tamponade, pulmonary edema, gastritis/PUD, esophagitis, hypertensive urgency/emergency.  R/o Dx: ACS, GERD, pulmonary embolism, community-acquired pneumonia, aortic dissection, pneumothorax, underlying bony abnormality, anemia, thyrotoxicosis, esophageal rupture, CHF exacerbation, valvular disorder, myocarditis, pericarditis, endocarditis, pericardial effusion/cardiac tamponade, pulmonary edema, gastritis/PUD,  esophagitis: These are considered less likely due to history of present illness and physical exam findings. Aortic Dissection: less likely based on the location, quality, onset, and severity of symptoms in this case. Patient also has a lack of underlying history of AD or TAA.   Review of prior external notes: 04/28/2023 discharge summary  Unique Tests and My Interpretation:  EKG: Rate, rhythm, axis, intervals all examined and without medically relevant abnormality. ST segments without concerns for elevations Troponin: 42 CXR: No acute findings CBC: Unremarkable BMP: Unremarkable  Social Determinants of Health: none  Discussion with Independent Historian: None  Discussion of Management of Tests:  Arlyss Queen, MD Hospitalist  Risk: High: hospitalization or escalation of hospital-level care  Risk Stratification Score: HEART Score: 5  Staffed with Wallace Cullens  Plan: On exam patient was in no acute distress with reasonable vitals.  Patient's blood pressure when I first evaluated her was 183/79 and patient states that she is unsure if she is taking her blood pressure medications.  Patient states that the chest pain is only present when she moves and has been present for the past at least 5 to 6 days. Patient's physical was unremarkable. Labs and CXR will be ordered.  Patient had recent dissection so that was negative and so do not feel we need imaging at this time and we will get labs first.  Patient blood pressure was noted to be 206/78 at 1 point so we will give Lopressor she is post be taking beta-blocker.  Patient stable at this time.  Patient's blood pressure was still elevated 207 systolic after the Lopressor.  Labs did come back showing elevated troponin at 42 and so do suspect hypertensive emergency most likely due to not taking her medications at home and so we will start her on nitro drip and admit her to the hospitalist for progressive bed.  Patient stable for admission at this time.  I  spoke to the hospitalist and was accepted for admission.  Patient stable for admission at this time.  This chart was dictated using voice recognition software.  Despite best efforts to proofread,  errors can occur which can change the documentation meaning.   Final Clinical Impression(s) / ED Diagnoses Final diagnoses:  Hypertensive emergency    Rx / DC Orders ED Discharge Orders     None         Remi Deter 04/30/23 1227    Edwin Dada P, DO 05/07/23 0139

## 2023-04-30 NOTE — ED Triage Notes (Signed)
PT BIB [facility] from home, CC of chest pain rates 2/10. PT recently seen for kidney infection and [facility] relays that family told them they did not pick up PT's antibiotics because she has 'problems' with them. PT Aox4, has rough patch of skin near sacrum and slight swelling on R flank. [facility] VSS.

## 2023-04-30 NOTE — Transitions of Care (Post Inpatient/ED Visit) (Signed)
   04/30/2023  Name: Brandy Gonzales MRN: [id number] DOB: [birth year]  Today's TOC FU Call Status: Today's TOC FU Call Status:: Unsuccessful Call (2nd Attempt) Unsuccessful Call (1st Attempt) Date: 04/29/23 Unsuccessful Call (2nd Attempt) Date: 04/30/23  Attempted to reach the patient regarding the most recent Inpatient/ED visit.  Follow Up Plan: No further outreach attempts will be made at this time. We have been unable to contact the patient. Patient in hospital Signature Karena Addison, LPN [facility] Nurse Health Advisor Direct Dial [phone]

## 2023-04-30 NOTE — ED Notes (Signed)
PT takes her medications whole and is baseline demented. PT will say "no" to things you ask but will willingly comply if explained to her.PT does have a wound in her sacral area.

## 2023-04-30 NOTE — Progress Notes (Signed)
IV team consulted for difficult IV start with lab draw. Upon assessment, patient has 2 working PIV's. Spoke to primary RN and explained that IV team does not start PIV's specifically for lab draws because we cannot guarantee blood return.
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
## 2023-04-30 NOTE — ED Notes (Signed)
Patient transported to CT 

## 2023-05-01 ENCOUNTER — Observation Stay (HOSPITAL_COMMUNITY): Payer: Medicare Other

## 2023-05-01 DIAGNOSIS — Z0389 Encounter for observation for other suspected diseases and conditions ruled out: Secondary | ICD-10-CM | POA: Diagnosis not present

## 2023-05-01 DIAGNOSIS — E119 Type 2 diabetes mellitus without complications: Secondary | ICD-10-CM | POA: Diagnosis not present

## 2023-05-01 DIAGNOSIS — Z8249 Family history of ischemic heart disease and other diseases of the circulatory system: Secondary | ICD-10-CM | POA: Diagnosis not present

## 2023-05-01 DIAGNOSIS — Z888 Allergy status to other drugs, medicaments and biological substances status: Secondary | ICD-10-CM | POA: Diagnosis not present

## 2023-05-01 DIAGNOSIS — N12 Tubulo-interstitial nephritis, not specified as acute or chronic: Secondary | ICD-10-CM | POA: Diagnosis present

## 2023-05-01 DIAGNOSIS — Z01818 Encounter for other preprocedural examination: Secondary | ICD-10-CM | POA: Diagnosis not present

## 2023-05-01 DIAGNOSIS — Z7982 Long term (current) use of aspirin: Secondary | ICD-10-CM | POA: Diagnosis not present

## 2023-05-01 DIAGNOSIS — G309 Alzheimer's disease, unspecified: Secondary | ICD-10-CM | POA: Diagnosis present

## 2023-05-01 DIAGNOSIS — Z86718 Personal history of other venous thrombosis and embolism: Secondary | ICD-10-CM | POA: Diagnosis not present

## 2023-05-01 DIAGNOSIS — E1122 Type 2 diabetes mellitus with diabetic chronic kidney disease: Secondary | ICD-10-CM | POA: Diagnosis present

## 2023-05-01 DIAGNOSIS — R079 Chest pain, unspecified: Secondary | ICD-10-CM | POA: Diagnosis not present

## 2023-05-01 DIAGNOSIS — Z885 Allergy status to narcotic agent status: Secondary | ICD-10-CM | POA: Diagnosis not present

## 2023-05-01 DIAGNOSIS — N1832 Chronic kidney disease, stage 3b: Secondary | ICD-10-CM | POA: Diagnosis present

## 2023-05-01 DIAGNOSIS — Z8049 Family history of malignant neoplasm of other genital organs: Secondary | ICD-10-CM | POA: Diagnosis not present

## 2023-05-01 DIAGNOSIS — R7989 Other specified abnormal findings of blood chemistry: Secondary | ICD-10-CM

## 2023-05-01 DIAGNOSIS — Z7984 Long term (current) use of oral hypoglycemic drugs: Secondary | ICD-10-CM | POA: Diagnosis not present

## 2023-05-01 DIAGNOSIS — G40909 Epilepsy, unspecified, not intractable, without status epilepticus: Secondary | ICD-10-CM | POA: Diagnosis present

## 2023-05-01 DIAGNOSIS — I160 Hypertensive urgency: Secondary | ICD-10-CM | POA: Diagnosis not present

## 2023-05-01 DIAGNOSIS — Z833 Family history of diabetes mellitus: Secondary | ICD-10-CM | POA: Diagnosis not present

## 2023-05-01 DIAGNOSIS — Z993 Dependence on wheelchair: Secondary | ICD-10-CM | POA: Diagnosis not present

## 2023-05-01 DIAGNOSIS — M7989 Other specified soft tissue disorders: Secondary | ICD-10-CM

## 2023-05-01 DIAGNOSIS — Z7401 Bed confinement status: Secondary | ICD-10-CM | POA: Diagnosis not present

## 2023-05-01 DIAGNOSIS — Z91014 Allergy to mammalian meats: Secondary | ICD-10-CM | POA: Diagnosis not present

## 2023-05-01 DIAGNOSIS — R918 Other nonspecific abnormal finding of lung field: Secondary | ICD-10-CM | POA: Diagnosis not present

## 2023-05-01 DIAGNOSIS — Z79899 Other long term (current) drug therapy: Secondary | ICD-10-CM | POA: Diagnosis not present

## 2023-05-01 DIAGNOSIS — E1140 Type 2 diabetes mellitus with diabetic neuropathy, unspecified: Secondary | ICD-10-CM | POA: Diagnosis present

## 2023-05-01 DIAGNOSIS — K219 Gastro-esophageal reflux disease without esophagitis: Secondary | ICD-10-CM | POA: Diagnosis present

## 2023-05-01 DIAGNOSIS — D649 Anemia, unspecified: Secondary | ICD-10-CM | POA: Diagnosis not present

## 2023-05-01 DIAGNOSIS — R5381 Other malaise: Secondary | ICD-10-CM | POA: Diagnosis not present

## 2023-05-01 DIAGNOSIS — I161 Hypertensive emergency: Principal | ICD-10-CM

## 2023-05-01 DIAGNOSIS — D631 Anemia in chronic kidney disease: Secondary | ICD-10-CM | POA: Diagnosis present

## 2023-05-01 DIAGNOSIS — F0280 Dementia in other diseases classified elsewhere without behavioral disturbance: Secondary | ICD-10-CM | POA: Diagnosis present

## 2023-05-01 DIAGNOSIS — I129 Hypertensive chronic kidney disease with stage 1 through stage 4 chronic kidney disease, or unspecified chronic kidney disease: Secondary | ICD-10-CM | POA: Diagnosis present

## 2023-05-01 DIAGNOSIS — Z87448 Personal history of other diseases of urinary system: Secondary | ICD-10-CM | POA: Diagnosis not present

## 2023-05-01 LAB — BASIC METABOLIC PANEL
Anion gap: 6 (ref 5–15)
BUN: 21 mg/dL (ref 8–23)
CO2: 27 mmol/L (ref 22–32)
Calcium: 9.6 mg/dL (ref 8.9–10.3)
Chloride: 108 mmol/L (ref 98–111)
Creatinine, Ser: 1.32 mg/dL — ABNORMAL HIGH (ref 0.44–1.00)
GFR, Estimated: 40 mL/min — ABNORMAL LOW (ref >60)
Glucose, Bld: 168 mg/dL — ABNORMAL HIGH (ref 70–99)
Potassium: 4 mmol/L (ref 3.5–5.1)
Sodium: 141 mmol/L (ref 135–145)

## 2023-05-01 LAB — CBC
HCT: 32.3 % — ABNORMAL LOW (ref 36.0–46.0)
Hemoglobin: 10.8 g/dL — ABNORMAL LOW (ref 12.0–15.0)
MCH: 31.7 pg (ref 26.0–34.0)
MCHC: 33.4 g/dL (ref 30.0–36.0)
MCV: 94.7 fL (ref 80.0–100.0)
Platelets: 178 10*3/uL (ref 150–400)
RBC: 3.41 MIL/uL — ABNORMAL LOW (ref 3.87–5.11)
RDW: 13.1 % (ref 11.5–15.5)
WBC: 6.5 10*3/uL (ref 4.0–10.5)
nRBC: 0 % (ref 0.0–0.2)

## 2023-05-01 LAB — CULTURE, BLOOD (ROUTINE X 2)
Culture: NO GROWTH
Culture: NO GROWTH

## 2023-05-01 LAB — MRSA NEXT GEN BY PCR, NASAL: MRSA by PCR Next Gen: NOT DETECTED

## 2023-05-01 MED ORDER — HYDRALAZINE HCL 50 MG PO TABS
50.0000 mg | ORAL_TABLET | Freq: Two times a day (BID) | ORAL | Status: DC
  Administered 2023-05-01 – 2023-05-02 (×2): 50 mg via ORAL
  Filled 2023-05-01 (×2): qty 1

## 2023-05-01 MED ORDER — ENSURE ENLIVE PO LIQD
237.0000 mL | Freq: Two times a day (BID) | ORAL | Status: DC
  Administered 2023-05-03: 237 mL via ORAL

## 2023-05-01 MED ORDER — ORAL CARE MOUTH RINSE: 15.0000 mL | OROMUCOSAL | Status: DC | PRN

## 2023-05-01 NOTE — Assessment & Plan Note (Signed)
Patient with prior and age-indeterminate left lower extremity DVT last noted on Doppler ultrasound on 2/22.  Repeat study on 3/29 did not show any signs, but was limited.  Patient is sedentary.  Appears patient had previously had IVC filter placed as well from the 2000's. -Follow-up repeat Doppler ultrasound of lower extremities

## 2023-05-01 NOTE — Assessment & Plan Note (Signed)
Elevated D-dimer at 5.80.  Patient do have an history of prior DVT and IVC filter placed. Lower extremity venous Doppler was ordered-pending results VQ scan-trying to avoid contrast with CKD

## 2023-05-01 NOTE — TOC Progression Note (Signed)
Transition of Care [facility]) - Progression Note    Patient Details  Name: Brandy Gonzales MRN: [id number] Date of Birth: January 25, 1941  [facility] CM/SW Contact  Ronny Bacon, RN Phone Number: 05/01/2023, 9:33 AM  Clinical Narrative:  Due to patient history of Dementia, phone call attempt made to daughter to discuss MOON letter. Voicemail is to a person named Scarlette Calico, daughter name listed as [name]. Did not leave voicemail.         Expected Discharge Plan and Services                                               Social Determinants of Health (SDOH) Interventions SDOH Screenings   Food Insecurity: No Food Insecurity (05/01/2023)  Recent Concern: Food Insecurity - Food Insecurity Present (04/13/2023)  Housing: Low Risk  (05/01/2023)  Transportation Needs: No Transportation Needs (05/01/2023)  Utilities: Not At Risk (05/01/2023)  Alcohol Screen: Low Risk  (03/24/2021)  Depression (PHQ2-9): High Risk (04/13/2023)  Financial Resource Strain: Medium Risk (04/13/2023)  Physical Activity: Unknown (04/13/2023)  Social Connections: Socially Isolated (04/13/2023)  Stress: No Stress Concern Present (04/13/2023)  Tobacco Use: Low Risk  (04/30/2023)    Readmission Risk Interventions    06/12/2022   11:19 AM 05/20/2022    4:35 PM  Readmission Risk Prevention Plan  Transportation Screening Complete Complete  PCP or Specialist Appt within 3-5 Days Complete Complete  HRI or Home Care Consult Complete --  Social Work Consult for Recovery Care Planning/Counseling Complete Complete  Palliative Care Screening Not Applicable Not Applicable  Medication Review Oceanographer) Complete Complete

## 2023-05-01 NOTE — Hospital Course (Addendum)
Taken from H&P.  Brandy Gonzales is a 82 y.o. female with medical history significant of hypertension, diabetes mellitus type 2, left lower extremity DVT, CKD stage IIIb, and dementia who presents with reported complaints of chest pain.   Patient just recently been hospitalized from 04/01/2023 -04/04/2023 for new onset seizures, during that admission she had a CT head which demonstrated calcification versus right parietal cavernosa, the patient was started on Keppra 500 mg twice daily due to concern for subclinical seizures.  Subsequently patient was hospitalized 12/15-12/18 for generalized weakness and abdominal pain found to have acute bilateral pyelonephritis with cultures positive for E. coli.  Patient has been initially treated with IV Rocephin and discharged home to complete a total 7-day course of cefadroxil.    Patient was also having left flank swelling with tenderness.  History of some concerning syncopal episode on Coreg 25 mg twice daily, daughter decreased the dose to 12.5 mg twice daily.  Upon admission into the emergency department patient was found to be afebrile with blood pressures elevated up to 210/84, and O2 saturations maintained. Labs significant for high-sensitivity troponin 42.  Chest x-ray noted no acute  intrathoracic process.  Patient had been given metoprolol 5 mg IV x 1 dose and started on a nitroglycerin drip.   12/21: Blood pressure slowly improving, at 161/79.  CT abdomen and pelvis with no hydronephrosis or nephrolithiasis, no other acute intra-abdominal or pelvic pathology.  Noted to have small bilateral pleural effusions and bibasilar compression atelectasis.  BNP at 238, D-dimer 5.80 and troponin with a flat curve 42>.40.  Checking lower extremity venous Doppler and VQ scan, trying to avoid contrast with CKD and patient already received contrast about a week ago.  Pending echocardiogram  12/22: Hemodynamically stable, blood pressure remained mildly elevated-increasing  hydralazine to 75 mg twice daily.  Lower extremity venous Doppler negative for DVT.  Pending echocardiogram and VQ scan.  Daughter would like to have completed scans before going home

## 2023-05-01 NOTE — Assessment & Plan Note (Signed)
Patient with slowly worsening functional status and overall decline. She is basically bed and wheelchair bound for the past many months. -PT/OT evaluation

## 2023-05-01 NOTE — Assessment & Plan Note (Signed)
 Renal functions around baseline. -Monitor renal function -Avoid nephrotoxins

## 2023-05-01 NOTE — Assessment & Plan Note (Signed)
Seems well-controlled with A1c of 5.8. Holding home Amaryl -Carb modified diet -Continue to monitor

## 2023-05-01 NOTE — Progress Notes (Signed)
Progress Note   Patient: Brandy Gonzales [id number] DOB: November 14, 1940 DOA: 04/30/2023     0 DOS: the patient was seen and examined on 05/01/2023   Brief hospital course: Taken from H&P.  Brandy Gonzales is a 82 y.o. female with medical history significant of hypertension, diabetes mellitus type 2, left lower extremity DVT, CKD stage IIIb, and dementia who presents with reported complaints of chest pain.   Patient just recently been hospitalized from 04/01/2023 -04/04/2023 for new onset seizures, during that admission she had a CT head which demonstrated calcification versus right parietal cavernosa, the patient was started on Keppra 500 mg twice daily due to concern for subclinical seizures.  Subsequently patient was hospitalized 12/15-12/18 for generalized weakness and abdominal pain found to have acute bilateral pyelonephritis with cultures positive for E. coli.  Patient has been initially treated with IV Rocephin and discharged home to complete a total 7-day course of cefadroxil.    Patient was also having left flank swelling with tenderness.  History of some concerning syncopal episode on Coreg 25 mg twice daily, daughter decreased the dose to 12.5 mg twice daily.  Upon admission into the emergency department patient was found to be afebrile with blood pressures elevated up to 210/84, and O2 saturations maintained. Labs significant for high-sensitivity troponin 42.  Chest x-ray noted no acute  intrathoracic process.  Patient had been given metoprolol 5 mg IV x 1 dose and started on a nitroglycerin drip.   12/21: Blood pressure slowly improving, at 161/79.  CT abdomen and pelvis with no hydronephrosis or nephrolithiasis, no other acute intra-abdominal or pelvic pathology.  Noted to have small bilateral pleural effusions and bibasilar compression atelectasis.  BNP at 238, D-dimer 5.80 and troponin with a flat curve 42>.40.  Checking lower extremity venous Doppler and VQ scan, trying to avoid contrast  with CKD and patient already received contrast about a week ago.  Pending echocardiogram      Assessment and Plan: * Chest pain Mildly elevated troponin with a flat curve.  Echocardiogram ordered-pending.  Prior echocardiogram with normal EF and grade 1 diastolic dysfunction, mildly elevated BNP at 238 but clinically appears euvolemic.  Elevated D-dimer -Follow-up echocardiogram -Continue to monitor  Elevated d-dimer Elevated D-dimer at 5.80.  Patient do have an history of prior DVT and IVC filter placed. Lower extremity venous Doppler was ordered-pending results VQ scan-trying to avoid contrast with CKD  Hypertensive urgency Chest pressure most likely secondary to hypertensive urgency with significantly elevated blood pressure.  Per son patient was taking her medications.  Apparently she was taking half of the prescribed dose for concern of prior syncopal episode. Blood pressure improved but still elevated. -Continue Coreg at 12.5 mg twice daily -Increasing the dose of hydralazine to 50 mg twice daily -Continue to monitor  History of pyelonephritis Patient with history of recent E. coli UTI and pyelonephritis and still on antibiotic.  Repeat CT abdomen and pelvis with no acute abnormalities or concern. -Continue with cefadroxil to complete the course  Controlled type 2 diabetes mellitus without complication, without long-term current use of insulin (HCC) Seems well-controlled with A1c of 5.8. Holding home Amaryl -Carb modified diet -Continue to monitor  Stage 3b chronic kidney disease (CKD) (HCC) Renal functions around baseline. -Monitor renal function -Avoid nephrotoxins  Normocytic anemia Hemoglobin around baseline. -Continue to monitor  Seizure disorder [facility]) Patient recently diagnosed with was thought to be subclinical seizures during hospitalization in November of this year. -Continue Keppra  Debility Patient with slowly  worsening functional status and overall  decline. She is basically bed and wheelchair bound for the past many months. -PT/OT evaluation  History of DVT (deep vein thrombosis) Patient with prior and age-indeterminate left lower extremity DVT last noted on Doppler ultrasound on 2/22.  Repeat study on 3/29 did not show any signs, but was limited.  Patient is sedentary.  Appears patient had previously had IVC filter placed as well from the 2000's. -Follow-up repeat Doppler ultrasound of lower extremities  GERD (gastroesophageal reflux disease) -Continue Protonix      Subjective: Patient was feeling weak, per family progressive decline over month.  No chest pain or shortness of breath.  Physical Exam: Vitals:   05/01/23 0632 05/01/23 0759 05/01/23 0930 05/01/23 1100  BP: (!) 154/73 (!) 161/79 (!) 161/66 (!) 166/78  Pulse: 69 64  76  Resp: 12 11  13   Temp:  97.6 F (36.4 C)  97.8 F (36.6 C)  TempSrc:  Oral  Oral  SpO2: 99% 100%  98%  Weight:      Height:       General.  Frail elderly lady, in no acute distress. Pulmonary.  Lungs clear bilaterally, normal respiratory effort. CV.  Regular rate and rhythm, no JVD, rub or murmur. Abdomen.  Soft, nontender, nondistended, BS positive. CNS.  Alert and oriented .  No focal neurologic deficit. Extremities.  No edema, no cyanosis, pulses intact and symmetrical.   Data Reviewed: Prior data reviewed  Family Communication: Discussed with multiple family members at bedside which include son, sister and niece  Disposition: Status is: Observation The patient will require care spanning > 2 midnights and should be moved to inpatient because: Pending investigation  Planned Discharge Destination: Home with Home Health  Time spent: 50 minutes  This record has been created using Conservation officer, historic buildings. Errors have been sought and corrected,but may not always be located. Such creation errors do not reflect on the standard of care.   Author: Arnetha Courser, MD 05/01/2023  1:33 PM  For on call review [email or web address].

## 2023-05-01 NOTE — Assessment & Plan Note (Signed)
Hemoglobin around baseline. -Continue to monitor

## 2023-05-01 NOTE — Assessment & Plan Note (Signed)
Continue Protonix °

## 2023-05-01 NOTE — Assessment & Plan Note (Signed)
Chest pressure most likely secondary to hypertensive urgency with significantly elevated blood pressure.  Per son patient was taking her medications.  Apparently she was taking half of the prescribed dose for concern of prior syncopal episode. Blood pressure improved but still elevated. -Continue Coreg at 12.5 mg twice daily -Increasing the dose of hydralazine to 50 mg twice daily -Continue to monitor

## 2023-05-01 NOTE — Assessment & Plan Note (Signed)
Patient recently diagnosed with was thought to be subclinical seizures during hospitalization in November of this year. -Continue Keppra

## 2023-05-01 NOTE — Care Management Obs Status (Signed)
MEDICARE OBSERVATION STATUS NOTIFICATION   Patient Details  Name: Brandy Gonzales MRN: [id number] Date of Birth: 10/01/1940   Medicare Observation Status Notification Given:  Yes    Ronny Bacon, RN 05/01/2023, 9:40 AM

## 2023-05-01 NOTE — Assessment & Plan Note (Signed)
Patient with history of recent E. coli UTI and pyelonephritis and still on antibiotic.  Repeat CT abdomen and pelvis with no acute abnormalities or concern. -Continue with cefadroxil to complete the course

## 2023-05-01 NOTE — Progress Notes (Signed)
VASCULAR LAB    Bilateral lower extremity venous duplex has been performed.  See CV proc for preliminary results.   Lakeia Bradshaw, RVT 05/01/2023, 11:29 AM

## 2023-05-01 NOTE — Assessment & Plan Note (Signed)
Mildly elevated troponin with a flat curve.  Echocardiogram ordered-pending.  Prior echocardiogram with normal EF and grade 1 diastolic dysfunction, mildly elevated BNP at 238 but clinically appears euvolemic.  Elevated D-dimer -Follow-up echocardiogram -Continue to monitor

## 2023-05-02 ENCOUNTER — Inpatient Hospital Stay (HOSPITAL_COMMUNITY): Payer: Medicare Other

## 2023-05-02 DIAGNOSIS — R079 Chest pain, unspecified: Secondary | ICD-10-CM

## 2023-05-02 DIAGNOSIS — Z87448 Personal history of other diseases of urinary system: Secondary | ICD-10-CM | POA: Diagnosis not present

## 2023-05-02 DIAGNOSIS — I160 Hypertensive urgency: Secondary | ICD-10-CM | POA: Diagnosis not present

## 2023-05-02 DIAGNOSIS — R7989 Other specified abnormal findings of blood chemistry: Secondary | ICD-10-CM | POA: Diagnosis not present

## 2023-05-02 LAB — ECHOCARDIOGRAM COMPLETE
AR max vel: 1.45 cm2
AV Area VTI: 1.49 cm2
AV Area mean vel: 1.61 cm2
AV Mean grad: 3 mm[Hg]
AV Peak grad: 6.1 mm[Hg]
Ao pk vel: 1.23 m/s
Area-P 1/2: 2.56 cm2
Calc EF: 55.2 %
Height: 59 in
MV VTI: 1.49 cm2
S' Lateral: 2.8 cm
Single Plane A2C EF: 54.4 %
Single Plane A4C EF: 54.3 %
Weight: 1763.68 [oz_av]

## 2023-05-02 MED ORDER — HYDRALAZINE HCL 50 MG PO TABS
75.0000 mg | ORAL_TABLET | Freq: Two times a day (BID) | ORAL | Status: DC
  Administered 2023-05-02 – 2023-05-03 (×2): 75 mg via ORAL
  Filled 2023-05-02 (×2): qty 1

## 2023-05-02 NOTE — Assessment & Plan Note (Signed)
Mildly elevated troponin with a flat curve.  Echocardiogram ordered-pending.  Prior echocardiogram with normal EF and grade 1 diastolic dysfunction, mildly elevated BNP at 238 but clinically appears euvolemic.  Elevated D-dimer -Follow-up echocardiogram -Continue to monitor

## 2023-05-02 NOTE — Progress Notes (Signed)
Progress Note   Patient: Brandy Gonzales [id number]:[id number] DOB: Sep 06, 1940 DOA: 04/30/2023     1 DOS: the patient was seen and examined on 05/02/2023   Brief hospital course: Taken from H&P.  Brandy Gonzales is a 82 y.o. female with medical history significant of hypertension, diabetes mellitus type 2, left lower extremity DVT, CKD stage IIIb, and dementia who presents with reported complaints of chest pain.   Patient just recently been hospitalized from 04/01/2023 -04/04/2023 for new onset seizures, during that admission she had a CT head which demonstrated calcification versus right parietal cavernosa, the patient was started on Keppra 500 mg twice daily due to concern for subclinical seizures.  Subsequently patient was hospitalized 12/15-12/18 for generalized weakness and abdominal pain found to have acute bilateral pyelonephritis with cultures positive for E. coli.  Patient has been initially treated with IV Rocephin and discharged home to complete a total 7-day course of cefadroxil.    Patient was also having left flank swelling with tenderness.  History of some concerning syncopal episode on Coreg 25 mg twice daily, daughter decreased the dose to 12.5 mg twice daily.  Upon admission into the emergency department patient was found to be afebrile with blood pressures elevated up to 210/84, and O2 saturations maintained. Labs significant for high-sensitivity troponin 42.  Chest x-ray noted no acute  intrathoracic process.  Patient had been given metoprolol 5 mg IV x 1 dose and started on a nitroglycerin drip.   12/21: Blood pressure slowly improving, at 161/79.  CT abdomen and pelvis with no hydronephrosis or nephrolithiasis, no other acute intra-abdominal or pelvic pathology.  Noted to have small bilateral pleural effusions and bibasilar compression atelectasis.  BNP at 238, D-dimer 5.80 and troponin with a flat curve 42>.40.  Checking lower extremity venous Doppler and VQ scan, trying to avoid contrast  with CKD and patient already received contrast about a week ago.  Pending echocardiogram  12/22: Hemodynamically stable, blood pressure remained mildly elevated-increasing hydralazine to 75 mg twice daily.  Lower extremity venous Doppler negative for DVT.  Pending echocardiogram and VQ scan.  Daughter would like to have completed scans before going home    Assessment and Plan: * Chest pain Mildly elevated troponin with a flat curve.  Echocardiogram ordered-pending.  Prior echocardiogram with normal EF and grade 1 diastolic dysfunction, mildly elevated BNP at 238 but clinically appears euvolemic.  Elevated D-dimer -Follow-up echocardiogram -Continue to monitor  Elevated d-dimer Elevated D-dimer at 5.80.  Patient do have an history of prior DVT and IVC filter placed. Lower extremity venous Doppler negative for DVT VQ scan-trying to avoid contrast with CKD-still pending  Hypertensive urgency Chest pressure most likely secondary to hypertensive urgency with significantly elevated blood pressure.  Per son patient was taking her medications.  Apparently she was taking half of the prescribed dose for concern of prior syncopal episode. Blood pressure improved but still elevated. -Continue Coreg at 12.5 mg twice daily -Further increasing the dose of hydralazine to 75 mg twice daily as blood pressure remained elevated -Continue to monitor  History of pyelonephritis Patient with history of recent E. coli UTI and pyelonephritis and still on antibiotic.  Repeat CT abdomen and pelvis with no acute abnormalities or concern. -Continue with cefadroxil to complete the course  Controlled type 2 diabetes mellitus without complication, without long-term current use of insulin (HCC) Seems well-controlled with A1c of 5.8. Holding home Amaryl -Carb modified diet -Continue to monitor  Stage 3b chronic kidney disease (CKD) (HCC) Renal  functions around baseline. -Monitor renal function -Avoid  nephrotoxins  Normocytic anemia Hemoglobin around baseline. -Continue to monitor  Seizure disorder [facility]) Patient recently diagnosed with was thought to be subclinical seizures during hospitalization in November of this year. -Continue Keppra  Debility Patient with slowly worsening functional status and overall decline. She is basically bed and wheelchair bound for the past many months. -PT/OT evaluation  History of DVT (deep vein thrombosis) Patient with prior and age-indeterminate left lower extremity DVT last noted on Doppler ultrasound on 2/22.  Repeat study on 3/29 did not show any signs, but was limited.  Patient is sedentary.  Appears patient had previously had IVC filter placed as well from the 2000's. -Follow-up repeat Doppler ultrasound of lower extremities  GERD (gastroesophageal reflux disease) -Continue Protonix      Subjective: Patient was seen and examined today.  No new concern.  Physical Exam: Vitals:   05/02/23 0804 05/02/23 0900 05/02/23 1000 05/02/23 1100  BP: (!) 191/88 (!) 186/88 (!) 168/75 (!) 159/81  Pulse:    79  Resp:    14  Temp:    (!) 97.4 F (36.3 C)  TempSrc:    Oral  SpO2:    99%  Weight:      Height:       General.  Frail and malnourished elderly lady, in no acute distress. Pulmonary.  Lungs clear bilaterally, normal respiratory effort. CV.  Regular rate and rhythm, no JVD, rub or murmur. Abdomen.  Soft, nontender, nondistended, BS positive. CNS.  Alert and oriented .  No focal neurologic deficit. Extremities.  No edema, no cyanosis, pulses intact and symmetrical.   Data Reviewed: Prior data reviewed  Family Communication: Discussed with daughter on phone  Disposition: Status is: Inpatient.    Planned Discharge Destination: Home with Home Health  Time spent: 40 minutes  This record has been created using Conservation officer, historic buildings. Errors have been sought and corrected,but may not always be located. Such creation errors  do not reflect on the standard of care.   Author: Arnetha Courser, MD 05/02/2023 1:39 PM  For on call review [email or web address].

## 2023-05-02 NOTE — Assessment & Plan Note (Signed)
Elevated D-dimer at 5.80.  Patient do have an history of prior DVT and IVC filter placed. Lower extremity venous Doppler negative for DVT VQ scan-trying to avoid contrast with CKD-still pending

## 2023-05-02 NOTE — Progress Notes (Signed)
  Echocardiogram 2D Echocardiogram has been performed.  Ocie Doyne RDCS 05/02/2023, 1:38 PM

## 2023-05-02 NOTE — Assessment & Plan Note (Signed)
Chest pressure most likely secondary to hypertensive urgency with significantly elevated blood pressure.  Per son patient was taking her medications.  Apparently she was taking half of the prescribed dose for concern of prior syncopal episode. Blood pressure improved but still elevated. -Continue Coreg at 12.5 mg twice daily -Further increasing the dose of hydralazine to 75 mg twice daily as blood pressure remained elevated -Continue to monitor

## 2023-05-03 ENCOUNTER — Inpatient Hospital Stay (HOSPITAL_COMMUNITY): Payer: Medicare Other

## 2023-05-03 DIAGNOSIS — R079 Chest pain, unspecified: Secondary | ICD-10-CM | POA: Diagnosis not present

## 2023-05-03 LAB — BASIC METABOLIC PANEL
Anion gap: 7 (ref 5–15)
BUN: 22 mg/dL (ref 8–23)
CO2: 26 mmol/L (ref 22–32)
Calcium: 9.8 mg/dL (ref 8.9–10.3)
Chloride: 105 mmol/L (ref 98–111)
Creatinine, Ser: 1.27 mg/dL — ABNORMAL HIGH (ref 0.44–1.00)
GFR, Estimated: 42 mL/min — ABNORMAL LOW (ref >60)
Glucose, Bld: 251 mg/dL — ABNORMAL HIGH (ref 70–99)
Potassium: 4.4 mmol/L (ref 3.5–5.1)
Sodium: 138 mmol/L (ref 135–145)

## 2023-05-03 LAB — CBC
HCT: 36 % (ref 36.0–46.0)
Hemoglobin: 12.1 g/dL (ref 12.0–15.0)
MCH: 31.8 pg (ref 26.0–34.0)
MCHC: 33.6 g/dL (ref 30.0–36.0)
MCV: 94.7 fL (ref 80.0–100.0)
Platelets: 201 10*3/uL (ref 150–400)
RBC: 3.8 MIL/uL — ABNORMAL LOW (ref 3.87–5.11)
RDW: 13.2 % (ref 11.5–15.5)
WBC: 6.8 10*3/uL (ref 4.0–10.5)
nRBC: 0 % (ref 0.0–0.2)

## 2023-05-03 MED ORDER — HYDRALAZINE HCL 50 MG PO TABS: 50.0000 mg | ORAL_TABLET | Freq: Three times a day (TID) | ORAL | 0 refills | Status: DC

## 2023-05-03 MED ORDER — TECHNETIUM TO 99M ALBUMIN AGGREGATED
4.3000 | Freq: Once | INTRAVENOUS | Status: AC | PRN
  Administered 2023-05-03: 4.3 via INTRAVENOUS

## 2023-05-03 NOTE — Discharge Summary (Signed)
Physician Discharge Summary   Patient: Brandy Gonzales MRN: [id number] DOB: [birth year]  Admit date:     04/30/2023  Discharge date: {dischdate:26783}  Discharge Physician: Jacquelin Hawking   PCP: Tresa Garter, MD   Recommendations at discharge:  {Tip this will not be part of the note when signed- Example include specific recommendations for outpatient follow-up, pending tests to follow-up on. (Optional):26781}  ***  Discharge Diagnoses: Principal Problem:   Chest pain Active Problems:   Hypertensive urgency   Elevated d-dimer   History of pyelonephritis   Controlled type 2 diabetes mellitus without complication, without long-term current use of insulin (HCC)   Stage 3b chronic kidney disease (CKD) (HCC)   Normocytic anemia   Seizure disorder (HCC)   Debility   History of DVT (deep vein thrombosis)   GERD (gastroesophageal reflux disease)   Hypertensive emergency  Resolved Problems:   * No resolved hospital problems. [facility] Course: Taken from H&P.  Brandy Gonzales is a 82 y.o. female with medical history significant of hypertension, diabetes mellitus type 2, left lower extremity DVT, CKD stage IIIb, and dementia who presents with reported complaints of chest pain.   Patient just recently been hospitalized from 04/01/2023 -04/04/2023 for new onset seizures, during that admission she had a CT head which demonstrated calcification versus right parietal cavernosa, the patient was started on Keppra 500 mg twice daily due to concern for subclinical seizures.  Subsequently patient was hospitalized 12/15-12/18 for generalized weakness and abdominal pain found to have acute bilateral pyelonephritis with cultures positive for E. coli.  Patient has been initially treated with IV Rocephin and discharged home to complete a total 7-day course of cefadroxil.    Patient was also having left flank swelling with tenderness.  History of some concerning syncopal episode on Coreg 25 mg twice  daily, daughter decreased the dose to 12.5 mg twice daily.  Upon admission into the emergency department patient was found to be afebrile with blood pressures elevated up to 210/84, and O2 saturations maintained. Labs significant for high-sensitivity troponin 42.  Chest x-ray noted no acute  intrathoracic process.  Patient had been given metoprolol 5 mg IV x 1 dose and started on a nitroglycerin drip.   12/21: Blood pressure slowly improving, at 161/79.  CT abdomen and pelvis with no hydronephrosis or nephrolithiasis, no other acute intra-abdominal or pelvic pathology.  Noted to have small bilateral pleural effusions and bibasilar compression atelectasis.  BNP at 238, D-dimer 5.80 and troponin with a flat curve 42>.40.  Checking lower extremity venous Doppler and VQ scan, trying to avoid contrast with CKD and patient already received contrast about a week ago.  Pending echocardiogram  12/22: Hemodynamically stable, blood pressure remained mildly elevated-increasing hydralazine to 75 mg twice daily.  Lower extremity venous Doppler negative for DVT.  Pending echocardiogram and VQ scan.  Daughter would like to have completed scans before going home      Assessment and Plan: * Chest pain Mildly elevated troponin with a flat curve.  Echocardiogram ordered-pending.  Prior echocardiogram with normal EF and grade 1 diastolic dysfunction, mildly elevated BNP at 238 but clinically appears euvolemic.  Elevated D-dimer -Follow-up echocardiogram -Continue to monitor  Elevated d-dimer Elevated D-dimer at 5.80.  Patient do have an history of prior DVT and IVC filter placed. Lower extremity venous Doppler negative for DVT VQ scan-trying to avoid contrast with CKD-still pending  Hypertensive urgency Chest pressure most likely secondary to hypertensive urgency with significantly elevated blood pressure.  Per son patient was taking her medications.  Apparently she was taking half of the prescribed dose for  concern of prior syncopal episode. Blood pressure improved but still elevated. -Continue Coreg at 12.5 mg twice daily -Further increasing the dose of hydralazine to 75 mg twice daily as blood pressure remained elevated -Continue to monitor  History of pyelonephritis Patient with history of recent E. coli UTI and pyelonephritis and still on antibiotic.  Repeat CT abdomen and pelvis with no acute abnormalities or concern. -Continue with cefadroxil to complete the course  Controlled type 2 diabetes mellitus without complication, without long-term current use of insulin (HCC) Seems well-controlled with A1c of 5.8. Holding home Amaryl -Carb modified diet -Continue to monitor  Stage 3b chronic kidney disease (CKD) (HCC) Renal functions around baseline. -Monitor renal function -Avoid nephrotoxins  Normocytic anemia Hemoglobin around baseline. -Continue to monitor  Seizure disorder [facility]) Patient recently diagnosed with was thought to be subclinical seizures during hospitalization in November of this year. -Continue Keppra  Debility Patient with slowly worsening functional status and overall decline. She is basically bed and wheelchair bound for the past many months. -PT/OT evaluation  History of DVT (deep vein thrombosis) Patient with prior and age-indeterminate left lower extremity DVT last noted on Doppler ultrasound on 2/22.  Repeat study on 3/29 did not show any signs, but was limited.  Patient is sedentary.  Appears patient had previously had IVC filter placed as well from the 2000's. -Follow-up repeat Doppler ultrasound of lower extremities  GERD (gastroesophageal reflux disease) -Continue Protonix      {Tip this will not be part of the note when signed Body mass index is 21.77 kg/m. , ,  Active Pressure Injury/Wound(s)     Pressure Ulcer  Duration          Pressure Injury 04/26/23 Buttocks Mid Deep Tissue Pressure Injury - Purple or maroon localized area of  discolored intact skin or blood-filled blister due to damage of underlying soft tissue from pressure and/or shear. moisture association 6 days           (Optional):26781}  {(NOTE) Pain control PDMP Statment (Optional):26782} Consultants: *** Procedures performed: ***  Disposition: {Plan; Disposition:26390} Diet recommendation:  {Diet_Plan:26776} DISCHARGE MEDICATION: Allergies as of 05/03/2023       Reactions   Amlodipine Besylate Other (See Comments)   Hair loss   Atenolol Other (See Comments)   Fluid retention   Oxycodone Hcl Other (See Comments)   Dizziness   Pork-derived Products Other (See Comments)   Religious preference   Propoxyphene N-acetaminophen Other (See Comments)   Unknown reaction   Enalapril Maleate Palpitations     Med Rec must be completed prior to using this [facility]***       Discharge Exam: Filed Weights   05/01/23 0551 05/02/23 0429 05/03/23 0443  Weight: 49.5 kg 50 kg 48.9 kg   ***  Condition at discharge: {DC Condition:26389}  The results of significant diagnostics from this hospitalization (including imaging, microbiology, ancillary and laboratory) are listed below for reference.   Imaging Studies: ECHOCARDIOGRAM COMPLETE Result Date: 05/02/2023    ECHOCARDIOGRAM REPORT   Patient Name:   JEANY PAGLIUCA Date of Exam: 05/02/2023 Medical Rec #:  [id number]    Height:       59.0 in Accession #:    [id number]   Weight:       110.2 lb Date of Birth:  03/22/1941    BSA:          1.432 m  Patient Age:    82 years     BP:           169/84 mmHg Patient Gender: F            HR:           73 bpm. Exam Location:  Inpatient Procedure: 2D Echo, Cardiac Doppler and Color Doppler Indications:    CP  History:        Patient has prior history of Echocardiogram examinations, most                 recent 10/15/2022. Signs/Symptoms:Syncope; Risk Factors:Diabetes                 and Hypertension.  Sonographer:    Vern Claude Referring Phys: [id number] RONDELL A Wenberg  IMPRESSIONS  1. Calcified papillary muscle. Left ventricular ejection fraction, by estimation, is 60 to 65%. The left ventricle has normal function. The left ventricle has no regional wall motion abnormalities. Left ventricular diastolic parameters are consistent with Grade I diastolic dysfunction (impaired relaxation).  2. Right ventricular systolic function is normal. The right ventricular size is normal.  3. The mitral valve is degenerative. No evidence of mitral valve regurgitation. The mean mitral valve gradient is 3.0 mmHg.  4. Forme Fruste Bicuspid related to right-non calcification with normal stroke volume index and DVI. The aortic valve is bicuspid. Aortic valve regurgitation is not visualized.  5. The inferior vena cava is normal in size with greater than 50% respiratory variability, suggesting right atrial pressure of 3 mmHg. Comparison(s): No significant change from prior study. FINDINGS  Left Ventricle: Calcified papillary muscle. Left ventricular ejection fraction, by estimation, is 60 to 65%. The left ventricle has normal function. The left ventricle has no regional wall motion abnormalities. The left ventricular internal cavity size was normal in size. There is no left ventricular hypertrophy. Left ventricular diastolic parameters are consistent with Grade I diastolic dysfunction (impaired relaxation). Right Ventricle: The right ventricular size is normal. No increase in right ventricular wall thickness. Right ventricular systolic function is normal. Left Atrium: Left atrial size was normal in size. Right Atrium: Right atrial size was normal in size. Pericardium: There is no evidence of pericardial effusion. Mitral Valve: The mitral valve is degenerative in appearance. Mild mitral annular calcification. No evidence of mitral valve regurgitation. MV peak gradient, 9.2 mmHg. The mean mitral valve gradient is 3.0 mmHg. Tricuspid Valve: The tricuspid valve is normal in structure. Tricuspid valve  regurgitation is trivial. No evidence of tricuspid stenosis. Aortic Valve: Forme Fruste Bicuspid related to right-non calcification with normal stroke volume index and DVI. The aortic valve is bicuspid. Aortic valve regurgitation is not visualized. Aortic valve mean gradient measures 3.0 mmHg. Aortic valve peak gradient measures 6.1 mmHg. Aortic valve area, by VTI measures 1.49 cm. Pulmonic Valve: The pulmonic valve was normal in structure. Pulmonic valve regurgitation is not visualized. No evidence of pulmonic stenosis. Aorta: The aortic root and ascending aorta are structurally normal, with no evidence of dilitation. Venous: The inferior vena cava is normal in size with greater than 50% respiratory variability, suggesting right atrial pressure of 3 mmHg. IAS/Shunts: The atrial septum is grossly normal.  LEFT VENTRICLE PLAX 2D LVIDd:         4.00 cm     Diastology LVIDs:         2.80 cm     LV e' medial:    5.77 cm/s LV PW:  1.00 cm     LV E/e' medial:  11.4 LV IVS:        0.80 cm     LV e' lateral:   4.46 cm/s LVOT diam:     1.60 cm     LV E/e' lateral: 14.7 LV SV:         38 LV SV Index:   27 LVOT Area:     2.01 cm  LV Volumes (MOD) LV vol d, MOD A2C: 58.1 ml LV vol d, MOD A4C: 71.7 ml LV vol s, MOD A2C: 26.5 ml LV vol s, MOD A4C: 32.8 ml LV SV MOD A2C:     31.6 ml LV SV MOD A4C:     71.7 ml LV SV MOD BP:      37.5 ml RIGHT VENTRICLE            IVC RV Basal diam:  2.30 cm    IVC diam: 1.20 cm RV Mid diam:    1.90 cm RV S prime:     9.03 cm/s TAPSE (M-mode): 1.8 cm LEFT ATRIUM           Index        RIGHT ATRIUM          Index LA diam:      2.40 cm 1.68 cm/m   RA Area:     6.66 cm LA Vol (A2C): 36.8 ml 25.70 ml/m  RA Volume:   10.80 ml 7.54 ml/m LA Vol (A4C): 14.6 ml 10.20 ml/m  AORTIC VALVE                    PULMONIC VALVE AV Area (Vmax):    1.45 cm     PV Vmax:       0.94 m/s AV Area (Vmean):   1.61 cm     PV Peak grad:  3.6 mmHg AV Area (VTI):     1.49 cm AV Vmax:           123.00 cm/s AV  Vmean:          78.600 cm/s AV VTI:            0.257 m AV Peak Grad:      6.1 mmHg AV Mean Grad:      3.0 mmHg LVOT Vmax:         88.90 cm/s LVOT Vmean:        63.000 cm/s LVOT VTI:          0.190 m LVOT/AV VTI ratio: 0.74  AORTA Ao Root diam: 2.70 cm Ao Asc diam:  2.90 cm MITRAL VALVE MV Area (PHT): 2.56 cm     SHUNTS MV Area VTI:   1.49 cm     Systemic VTI:  0.19 m MV Peak grad:  9.2 mmHg     Systemic Diam: 1.60 cm MV Mean grad:  3.0 mmHg MV Vmax:       1.52 m/s MV Vmean:      73.6 cm/s MV Decel Time: 296 msec MV E velocity: 65.70 cm/s MV A velocity: 124.00 cm/s MV E/A ratio:  0.53 Riley Lam MD Electronically signed by Riley Lam MD Signature Date/Time: 05/02/2023/1:59:53 PM    Final    VAS [place] LOWER EXTREMITY VENOUS (DVT) Result Date: 05/02/2023  Lower Venous DVT Study Patient Name:  MAYRIN LASKA  Date of Exam:   05/01/2023 Medical Rec #: [id number]     Accession #:    [id number] Date of Birth:  Mar 25, 1941     Patient Gender: F Patient Age:   35 years Exam Location[facility]  [facility] Procedure:      VAS [place] LOWER EXTREMITY VENOUS (DVT) Referring Phys: Madelyn Flavors --------------------------------------------------------------------------------  Indications: Swelling, and chest pain.  Risk Factors: DVT DVT 05/04/22 left CFV, FV, Profunda vein, popliteal vein. Comparison Study: Prior negative left LEV done 08/07/22. Performing Technologist: Sherren Kerns RVS  Examination Guidelines: A complete evaluation includes B-mode imaging, spectral Doppler, color Doppler, and power Doppler as needed of all accessible portions of each vessel. Bilateral testing is considered an integral part of a complete examination. Limited examinations for reoccurring indications may be performed as noted. The reflux portion of the exam is performed with the patient in reverse Trendelenburg.  +---------+---------------+---------+-----------+---------------+-------------+ RIGHT     CompressibilityPhasicitySpontaneityProperties     Thrombus                                                                 Aging         +---------+---------------+---------+-----------+---------------+-------------+ CFV      Full           Yes      No         pulsatile                                                                waveform                     +---------+---------------+---------+-----------+---------------+-------------+ SFJ      Full                                                            +---------+---------------+---------+-----------+---------------+-------------+ FV Prox  Full                                                            +---------+---------------+---------+-----------+---------------+-------------+ FV Mid   Full                                                            +---------+---------------+---------+-----------+---------------+-------------+ FV DistalFull                                                            +---------+---------------+---------+-----------+---------------+-------------+ PFV      Full                                                            +---------+---------------+---------+-----------+---------------+-------------+  POP      Full           Yes      No         pulsatile                                                                waveform                     +---------+---------------+---------+-----------+---------------+-------------+ PTV      Full                                                            +---------+---------------+---------+-----------+---------------+-------------+ PERO     Full                                                            +---------+---------------+---------+-----------+---------------+-------------+   +---------+---------------+---------+-----------+----------+-------------------+ LEFT      CompressibilityPhasicitySpontaneityPropertiesThrombus Aging      +---------+---------------+---------+-----------+----------+-------------------+ CFV      Full           Yes      Yes                                      +---------+---------------+---------+-----------+----------+-------------------+ SFJ      Full                                                             +---------+---------------+---------+-----------+----------+-------------------+ FV Prox  Full                                                             +---------+---------------+---------+-----------+----------+-------------------+ FV Mid   Full                                                             +---------+---------------+---------+-----------+----------+-------------------+ FV DistalFull           Yes      Yes                                      +---------+---------------+---------+-----------+----------+-------------------+ PFV      Full                                                             +---------+---------------+---------+-----------+----------+-------------------+  POP      Full           Yes      Yes                                      +---------+---------------+---------+-----------+----------+-------------------+ PTV                                                   Not well visualized +---------+---------------+---------+-----------+----------+-------------------+ PERO     Full                                                             +---------+---------------+---------+-----------+----------+-------------------+     Summary: BILATERAL: - No evidence of deep vein thrombosis seen in the lower extremities, bilaterally. -No evidence of popliteal cyst, bilaterally.   *See table(s) above for measurements and observations. Electronically signed by Coral Else MD on 05/02/2023 at 12:12:45 AM.    Final    CT ABDOMEN PELVIS WO CONTRAST Result Date:  04/30/2023 CLINICAL DATA:  Abdominal pain.  Concern for kidney stone. EXAM: CT ABDOMEN AND PELVIS WITHOUT CONTRAST TECHNIQUE: Multidetector CT imaging of the abdomen and pelvis was performed following the standard protocol without IV contrast. RADIATION DOSE REDUCTION: This exam was performed according to the departmental dose-optimization program which includes automated exposure control, adjustment of the mA and/or kV according to patient size and/or use of iterative reconstruction technique. COMPARISON:  CT dated 04/25/2023. FINDINGS: Evaluation of this exam is limited in the absence of intravenous contrast as well as due to streak artifact caused by patient's arms. Lower chest: Small bilateral pleural effusions and bibasilar compressive atelectasis or infiltrate. There is coronary vascular calcification. No intra-abdominal free air or free fluid. Hepatobiliary: The liver is unremarkable. There is mild biliary ductal dilatation versus periportal edema. Layering sludge and/or small stones in the gallbladder. No pericholecystic fluid. Pancreas: Unremarkable. No pancreatic ductal dilatation or surrounding inflammatory changes. Spleen: Normal in size without focal abnormality. Adrenals/Urinary Tract: Mild bilateral adrenal thickening/hyperplasia. There is mild bilateral renal parenchyma atrophy. There is no hydronephrosis or nephrolithiasis on either side. There is a 1 cm ill-defined high attenuating lesion in the interpolar left kidney which is not characterized but may represent a complex cyst. Ultrasound may provide better evaluation if clinically indicated. The visualized ureters and urinary bladder appear unremarkable. Stomach/Bowel: There is sigmoid diverticulosis without active inflammatory changes. There is moderate stool throughout the colon. There is no bowel obstruction or active inflammation. The appendix is normal. Vascular/Lymphatic: Mild aortoiliac atherosclerotic disease. The IVC is unremarkable. An  infrarenal IVC filter noted. No portal venous gas. There is no adenopathy. Reproductive: Hysterectomy.  No suspicious adnexal masses. Other: Mild diffuse subcutaneous edema.  No fluid collection. Musculoskeletal: Osteopenia with degenerative changes of the spine. Left femoral neck ORIF. Avascular necrosis of the right femoral head. No acute osseous pathology. IMPRESSION: 1. No acute intra-abdominal or pelvic pathology. No hydronephrosis or nephrolithiasis. 2. Sigmoid diverticulosis. No bowel obstruction. Normal appendix. 3. Small bilateral pleural effusions and bibasilar compressive atelectasis or infiltrate. 4.  Aortic Atherosclerosis (ICD10-I70.0). Electronically Signed  By: Elgie Collard M.D.   On: 04/30/2023 18:51   DG Chest Port 1 View Result Date: 04/30/2023 CLINICAL DATA:  Chest pain, recent urinary tract infection EXAM: PORTABLE CHEST 1 VIEW COMPARISON:  04/25/2023 FINDINGS: Single frontal view of the chest demonstrates a stable cardiac silhouette. No airspace disease, effusion, or pneumothorax. No acute bony abnormalities. IMPRESSION: 1. No acute intrathoracic process. Electronically Signed   By: Sharlet Salina M.D.   On: 04/30/2023 09:31   CT Angio Chest/Abd/Pel for Dissection W and/or Wo Contrast Result Date: 04/25/2023 CLINICAL DATA:  Dementia weakness pain EXAM: CT ANGIOGRAPHY CHEST, ABDOMEN AND PELVIS TECHNIQUE: Non-contrast CT of the chest was initially obtained. Multidetector CT imaging through the chest, abdomen and pelvis was performed using the standard protocol during bolus administration of intravenous contrast. Multiplanar reconstructed images and MIPs were obtained and reviewed to evaluate the vascular anatomy. RADIATION DOSE REDUCTION: This exam was performed according to the departmental dose-optimization program which includes automated exposure control, adjustment of the mA and/or kV according to patient size and/or use of iterative reconstruction technique. CONTRAST:  75mL  OMNIPAQUE IOHEXOL 350 MG/ML SOLN COMPARISON:  Chest x-ray 04/25/2023, CT 05/18/2022 FINDINGS: CTA CHEST FINDINGS Cardiovascular: Non contrasted images of the chest demonstrate no acute intramural hematoma. Nonaneurysmal aorta. No dissection is seen. Artifact obscures part of the aortic arch. Normal cardiac size. Coronary vascular calcification. No pericardial effusion Mediastinum/Nodes: Patent trachea. No thyroid mass. Borderline mediastinal lymph nodes. Right precarinal node measures 9 mm. Subcarinal node measures 10 mm. Multiple subcentimeter AP window nodes. Esophagus within normal limits. Lungs/Pleura: Small pleural effusions. Dependent atelectasis at the bases. No acute airspace disease or pneumothorax. Musculoskeletal: Old appearing sternal fracture. Vertebral body heights are maintained. Review of the MIP images confirms the above findings. CTA ABDOMEN AND PELVIS FINDINGS VASCULAR Aorta: Normal caliber aorta without aneurysm, dissection, vasculitis or significant stenosis. Celiac: Patent without evidence of aneurysm, dissection, vasculitis or significant stenosis. SMA: Patent without evidence of aneurysm, dissection, vasculitis or significant stenosis. Renals: 2 right (very small accessory artery to the lower pole of the right kidney) and single left renal arteries are patent without evidence of aneurysm, dissection, vasculitis, fibromuscular dysplasia or significant stenosis. IMA: Patent without evidence of aneurysm, dissection, vasculitis or significant stenosis. Inflow: Patent without evidence of aneurysm, dissection, vasculitis or significant stenosis. Veins: Suboptimally evaluated. IVC filter with apex in the infrarenal IVC. Review of the MIP images confirms the above findings. NON-VASCULAR Hepatobiliary: No focal hepatic abnormality. Intra hepatic biliary dilatation at the right hepatic lobe was better seen on contrast enhanced examination. Distended gallbladder without calcified stone. Pancreas:  Unremarkable. No pancreatic ductal dilatation or surrounding inflammatory changes. Spleen: Normal in size without focal abnormality. Adrenals/Urinary Tract: Adrenal glands are within normal limits. Renal cortical scarring. Multiple left renal cysts for which no imaging follow-up is recommended. Cortical hypoenhancement involving the left greater than right kidneys, most evident at superior pole. The bladder is slightly thick walled. Stomach/Bowel: The stomach is nonenlarged. There is no dilated small bowel. Large stool burden suggests constipation. No acute bowel wall thickening. Diverticular disease of the sigmoid colon. Moderate rectal distension by feces with some presacral edema. Lymphatic: No suspicious lymph nodes Reproductive: Hysterectomy.  No adnexal mass Other: No free air.  Generalized subcutaneous edema Musculoskeletal: Left hip hardware. Avascular necrosis of the femoral heads without collapse. Old left inferior pubic ramus fracture. Review of the MIP images confirms the above findings. IMPRESSION: 1. Negative for acute aortic dissection or aneurysm. No significant stenosis or evidence for  acute occlusive disease. 2. Small pleural effusions with dependent atelectasis at the bases. 3. Cortical hypoenhancement involving the left greater than right kidneys, findings could be secondary to pyelonephritis though infarct is also included in the differential given appearance of superior pole left kidney. 4. Slightly thick-walled appearance of urinary bladder, correlate with urinalysis. 5. Large stool burden suggests constipation. Moderate rectal distension by feces. 6. Generalized subcutaneous edema. Electronically Signed   By: Jasmine Pang M.D.   On: 04/25/2023 21:41   CT HEAD WO CONTRAST ( ) Result Date: 04/25/2023 CLINICAL DATA:  Mental status change, unknown cause EXAM: CT HEAD WITHOUT CONTRAST TECHNIQUE: Contiguous axial images were obtained from the base of the skull through the vertex without  intravenous contrast. RADIATION DOSE REDUCTION: This exam was performed according to the departmental dose-optimization program which includes automated exposure control, adjustment of the mA and/or kV according to patient size and/or use of iterative reconstruction technique. COMPARISON:  04/01/2023 FINDINGS: Brain: No evidence of acute infarction, hemorrhage, hydrocephalus, extra-axial collection or mass lesion/mass effect. Extensive low-density changes within the periventricular and subcortical white matter most compatible with chronic microvascular ischemic change. Moderate diffuse cerebral volume loss. Vascular: Atherosclerotic calcifications involving the large vessels of the skull base. No unexpected hyperdense vessel. Skull: Normal. Negative for fracture or focal lesion. Sinuses/Orbits: No acute finding. Other: None. IMPRESSION: 1. No acute intracranial findings. 2. Advanced chronic microvascular ischemic change and cerebral volume loss. Electronically Signed   By: Duanne Guess D.O.   On: 04/25/2023 20:10   DG Chest Port 1 View Result Date: 04/25/2023 CLINICAL DATA:  Altered mental status EXAM: PORTABLE CHEST 1 VIEW COMPARISON:  Chest x-ray 10/14/2022 FINDINGS: The heart size and mediastinal contours are within normal limits. Both lungs are clear. The visualized skeletal structures are unremarkable. IMPRESSION: No active disease. Electronically Signed   By: Darliss Cheney M.D.   On: 04/25/2023 19:10   Overnight EEG with video Result Date: 04/04/2023 Charlsie Quest, MD     04/04/2023  8:10 PM Patient Name: KARINGTON BIRDSONG MRN: [id number] Epilepsy Attending: Charlsie Quest Referring Physician/Provider: Rejeana Brock, MD Duration: 04/03/2023 1343 to 04/04/2023 4098  Patient history: 82 yo F brought in with forced L gaze, L arm and L leg rhythmic twitching. EEG to evaluate for seizure  Level of alertness: Awake, asleep  AEDs during EEG study: LEV  Technical aspects: This EEG study was done  with scalp electrodes positioned according to the 10-20 International system of electrode placement. Electrical activity was reviewed with band pass filter of 1-70Hz , sensitivity of 7 uV/mm, display speed of 60mm/sec with a 60Hz  notched filter applied as appropriate. EEG data were recorded continuously and digitally stored.  Video monitoring was available and reviewed as appropriate.  Description:  The posterior dominant rhythm consists of 7 Hz activity of moderate voltage (25-35 uV) seen predominantly in posterior head regions, symmetric and reactive to eye opening and eye closing. Sleep was characterized by vertex waves, sleep spindles (12 to 14 Hz), maximal frontocentral region. EEG showed continuous generalized 3 to 6 Hz theta-delta slowing. Lateralized periodic discharges were noted arising from right temporoparietal region with fluctuating frequency of 0.5-4Hz , at times with overriding fast activity. Hyperventilation and photic stimulation were not performed.    ABNORMALITY - Lateralized periodic discharges, right temporoparietal region - Continuous slow, generalized  IMPRESSION: This study showed evidence of epileptogenicity arising from right temporoparietal region. This EEG pattern is on the ictal-interictal region with increased risk of seizure recurrence. Additionally there is moderate  diffuse encephalopathy. No definite seizures were seen throughout the recording.  Charlsie Quest   MR BRAIN WO CONTRAST Result Date: 04/03/2023 CLINICAL DATA:  Seizure, new onset, no history of trauma. EXAM: MRI HEAD WITHOUT CONTRAST TECHNIQUE: Multiplanar, multiecho pulse sequences of the brain and surrounding structures were obtained without intravenous contrast. COMPARISON:  CT studies 2 days ago.  MRI 11/16/2021. FINDINGS: Brain: Diffusion imaging does not show any acute or subacute infarction or other cause of restricted diffusion. Chronic small-vessel ischemic changes are seen affecting the pons. 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 Cerebral hemispheres show advanced chronic small-vessel ischemic changes affecting the thalami, basal ganglia and throughout the cerebral hemispheric deep and subcortical white matter. Old cortical infarctions are seen in the left frontal lobe and right parietal lobe. Chronic foci of hemosiderin deposition/susceptibility artifact Prevident in the parieto-occipital junction regions on both sides. 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 No evidence of new hemorrhage since the prior studies. No evidence of mass, hydrocephalus or extra-axial collection. Ventricular prominence is in proportion to the degree of deep white matter disease. Vascular: Major vessels at the base of the brain show flow. Skull and upper cervical spine: Negative Sinuses/Orbits: Clear/normal Other: None IMPRESSION: 1. No acute finding by MRI. Advanced chronic small-vessel ischemic changes of the pons, cerebellum, thalami, basal ganglia and cerebral hemispheric white matter. 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 2. Chronic foci of hemosiderin deposition/susceptibility artifact in the parieto-occipital junction regions on both sides. 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 No evidence of new hemorrhage since the prior studies. Electronically Signed   By: Paulina Fusi M.D.   On: 04/03/2023 11:55    Microbiology: Results for orders placed or performed during the hospital encounter of 04/30/23  MRSA Next Gen by PCR, Nasal     Status: None   Collection Time: 05/01/23  2:00 AM   Specimen: Nasal Mucosa; Nasal Swab  Result Value Ref Range Status   MRSA by PCR Next Gen NOT DETECTED NOT DETECTED Final    Comment: (NOTE) The GeneXpert MRSA Assay (FDA approved for NASAL specimens only), is one  component of a comprehensive MRSA colonization surveillance program. It is not intended to diagnose MRSA infection nor to guide or monitor treatment for MRSA infections. Test performance is not FDA approved in patients less than 66 years old. Performed at [facility], [address] [place], [place] [address]     Labs: CBC: Recent Labs  Lab 04/27/23 0827 04/28/23 0706 04/30/23 1020 05/01/23 0218 05/03/23 0904  WBC 6.6 6.0 5.9 6.5 6.8  NEUTROABS 3.4 3.2  --   --   --   HGB 11.6* 10.5* 11.4* 10.8* 12.1  HCT 35.2* 31.3* 34.4* 32.3* 36.0  MCV 95.1 93.7 95.6 94.7 94.7  PLT 153 149* 190 178 201   Basic Metabolic Panel: Recent Labs  Lab 04/27/23 0630 04/28/23 0706 04/30/23 1020 05/01/23 0218  NA 145 141 143 141  K 4.3 4.5 4.1 4.0  CL 113* 110 108 108  CO2 22 20* 24 27  GLUCOSE 125* 169* 140* 168*  BUN 22 26* 25* 21  CREATININE 1.10* 1.40* 1.32* 1.32*  CALCIUM 9.6 9.3 9.9 9.6  MG 1.7 2.3  --   --    Liver Function Tests: No results  for input(s): "AST", "ALT", "ALKPHOS", "BILITOT", "PROT", "ALBUMIN" in the last 168 hours. CBG: Recent Labs  Lab 04/27/23 1204 04/27/23 1633 04/27/23 2018 04/28/23 0821 04/28/23 1129  GLUCAP 178* 227* 200* 154* 149*    Discharge time spent: {LESS THAN/GREATER THAN:26388} 30 minutes.  Signed: Jacquelin Hawking, MD [facility] 05/03/2023

## 2023-05-03 NOTE — Progress Notes (Signed)
Went over discharge paperwork with daughter and all questions answered. PIV/Telemetry removed. All belongings at bedside.

## 2023-05-03 NOTE — Progress Notes (Incomplete)
PROGRESS NOTE    [name]  BMW:[id number] DOB: Dec 31, 1940 DOA: 04/30/2023 PCP: Tresa Garter, MD   Brief Narrative: Taken from H&P.  Brandy Gonzales is a 82 y.o. female with medical history significant of hypertension, diabetes mellitus type 2, left lower extremity DVT, CKD stage IIIb, and dementia who presents with reported complaints of chest pain.   Patient just recently been hospitalized from 04/01/2023 -04/04/2023 for new onset seizures, during that admission she had a CT head which demonstrated calcification versus right parietal cavernosa, the patient was started on Keppra 500 mg twice daily due to concern for subclinical seizures.  Subsequently patient was hospitalized 12/15-12/18 for generalized weakness and abdominal pain found to have acute bilateral pyelonephritis with cultures positive for E. coli.  Patient has been initially treated with IV Rocephin and discharged home to complete a total 7-day course of cefadroxil.    Patient was also having left flank swelling with tenderness.  History of some concerning syncopal episode on Coreg 25 mg twice daily, daughter decreased the dose to 12.5 mg twice daily.  Upon admission into the emergency department patient was found to be afebrile with blood pressures elevated up to 210/84, and O2 saturations maintained. Labs significant for high-sensitivity troponin 42.  Chest x-ray noted no acute  intrathoracic process.  Patient had been given metoprolol 5 mg IV x 1 dose and started on a nitroglycerin drip.   12/21: Blood pressure slowly improving, at 161/79.  CT abdomen and pelvis with no hydronephrosis or nephrolithiasis, no other acute intra-abdominal or pelvic pathology.  Noted to have small bilateral pleural effusions and bibasilar compression atelectasis.  BNP at 238, D-dimer 5.80 and troponin with a flat curve 42>.40.  Checking lower extremity venous Doppler and VQ scan, trying to avoid contrast with CKD and patient already received  contrast about a week ago.  Pending echocardiogram  12/22: Hemodynamically stable, blood pressure remained mildly elevated-increasing hydralazine to 75 mg twice daily.  Lower extremity venous Doppler negative for DVT.  Pending echocardiogram and VQ scan.  Daughter would like to have completed scans before going home       Assessment and Plan: * Chest pain Mildly elevated troponin with a flat curve.  Echocardiogram ordered-pending.  Prior echocardiogram with normal EF and grade 1 diastolic dysfunction, mildly elevated BNP at 238 but clinically appears euvolemic.  Elevated D-dimer -Follow-up echocardiogram -Continue to monitor  Elevated d-dimer Elevated D-dimer at 5.80.  Patient do have an history of prior DVT and IVC filter placed. Lower extremity venous Doppler negative for DVT VQ scan-trying to avoid contrast with CKD-still pending  Hypertensive urgency Chest pressure most likely secondary to hypertensive urgency with significantly elevated blood pressure.  Per son patient was taking her medications.  Apparently she was taking half of the prescribed dose for concern of prior syncopal episode. Blood pressure improved but still elevated. -Continue Coreg at 12.5 mg twice daily -Further increasing the dose of hydralazine to 75 mg twice daily as blood pressure remained elevated -Continue to monitor  History of pyelonephritis Patient with history of recent E. coli UTI and pyelonephritis and still on antibiotic.  Repeat CT abdomen and pelvis with no acute abnormalities or concern. -Continue with cefadroxil to complete the course  Controlled type 2 diabetes mellitus without complication, without long-term current use of insulin (HCC) Seems well-controlled with A1c of 5.8. Holding home Amaryl -Carb modified diet -Continue to monitor  Stage 3b chronic kidney disease (CKD) (HCC) Renal functions around baseline. -Monitor renal  function -Avoid nephrotoxins  Normocytic anemia Hemoglobin  around baseline. -Continue to monitor  Seizure disorder [facility]) Patient recently diagnosed with was thought to be subclinical seizures during hospitalization in November of this year. -Continue Keppra  Debility Patient with slowly worsening functional status and overall decline. She is basically bed and wheelchair bound for the past many months. -PT/OT evaluation  History of DVT (deep vein thrombosis) Patient with prior and age-indeterminate left lower extremity DVT last noted on Doppler ultrasound on 2/22.  Repeat study on 3/29 did not show any signs, but was limited.  Patient is sedentary.  Appears patient had previously had IVC filter placed as well from the 2000's. -Follow-up repeat Doppler ultrasound of lower extremities  GERD (gastroesophageal reflux disease) -Continue Protonix        Pressure Injury 04/26/23 Buttocks Mid Deep Tissue Pressure Injury - Purple or maroon localized area of discolored intact skin or blood-filled blister due to damage of underlying soft tissue from pressure and/or shear. moisture association (Active)  04/26/23 2155  Location: Buttocks  Location Orientation: Mid  Staging: Deep Tissue Pressure Injury - Purple or maroon localized area of discolored intact skin or blood-filled blister due to damage of underlying soft tissue from pressure and/or shear.  Wound Description (Comments): moisture association  Present on Admission: Yes  Dressing Type Foam - Lift dressing to assess site every shift 05/02/23 1945    DVT prophylaxis: *** Code Status:   Code Status: Full Code Family Communication: *** Disposition Plan: ***   Consultants:  ***  Procedures:  ***  Antimicrobials: ***    Subjective: No complaints this morning. Feels well.  Objective: BP (!) 163/68 (BP Location: Left Arm) Comment: taken twice\  Pulse 76   Temp 97.6 F (36.4 C) (Oral)   Resp 17   Ht 4\' 11"  (1.499 m)   Wt 48.9 kg   SpO2 100%   BMI 21.77 kg/m    Examination:  General exam: Appears calm and comfortable Respiratory system: Clear to auscultation. Respiratory effort normal. Cardiovascular system: S1 & S2 heard, Normal rate with regular rhythm. Gastrointestinal system: Abdomen is nondistended, soft and nontender. Normal bowel sounds heard. Central nervous system: Alert and oriented. No focal neurological deficits. Musculoskeletal: LLE edema. No calf tenderness Psychiatry: Judgement and insight appear normal. Mood & affect appropriate.    Data Reviewed: I have personally reviewed following labs and imaging studies  CBC Lab Results  Component Value Date   WBC 6.8 05/03/2023   RBC 3.80 (L) 05/03/2023   HGB 12.1 05/03/2023   HCT 36.0 05/03/2023   MCV 94.7 05/03/2023   MCH 31.8 05/03/2023   PLT 201 05/03/2023   MCHC 33.6 05/03/2023   RDW 13.2 05/03/2023   LYMPHSABS 1.9 04/28/2023   MONOABS 0.5 04/28/2023   EOSABS 0.3 04/28/2023   BASOSABS 0.0 04/28/2023     Last metabolic panel Lab Results  Component Value Date   NA 141 05/01/2023   K 4.0 05/01/2023   CL 108 05/01/2023   CO2 27 05/01/2023   BUN 21 05/01/2023   CREATININE 1.32 (H) 05/01/2023   GLUCOSE 168 (H) 05/01/2023   GFRNONAA 40 (L) 05/01/2023   GFRAA 33 (L) 05/03/2019   CALCIUM 9.6 05/01/2023   PHOS 2.9 04/26/2023   PROT 5.0 (L) 04/26/2023   ALBUMIN 2.7 (L) 04/26/2023   BILITOT 0.6 04/26/2023   ALKPHOS 125 04/26/2023   AST 32 04/26/2023   ALT 34 04/26/2023   ANIONGAP 6 05/01/2023    GFR: Estimated Creatinine Clearance: 22  mL/min (A) (by C-G formula based on SCr of 1.32 mg/dL (H)).  Recent Results (from the past 240 hours)  Urine Culture     Status: Abnormal   Collection Time: 04/25/23 10:05 PM   Specimen: Urine, Random  Result Value Ref Range Status   Specimen Description URINE, RANDOM  Final   Special Requests   Final    NONE Reflexed from [id number] Performed at [facility], [address] [place], [place] [address]    Culture  >=100,000 COLONIES/mL ESCHERICHIA COLI (A)  Final   Report Status 04/28/2023 FINAL  Final   Organism ID, Bacteria ESCHERICHIA COLI (A)  Final      Susceptibility   Escherichia coli - MIC*    AMPICILLIN >=32 RESISTANT Resistant     CEFAZOLIN <=4 SENSITIVE Sensitive     CEFEPIME <=0.12 SENSITIVE Sensitive     CEFTRIAXONE <=0.25 SENSITIVE Sensitive     CIPROFLOXACIN <=0.25 SENSITIVE Sensitive     GENTAMICIN <=1 SENSITIVE Sensitive     IMIPENEM <=0.25 SENSITIVE Sensitive     NITROFURANTOIN <=16 SENSITIVE Sensitive     TRIMETH/SULFA >=320 RESISTANT Resistant     AMPICILLIN/SULBACTAM 16 INTERMEDIATE Intermediate     PIP/TAZO <=4 SENSITIVE Sensitive ug/mL    * >=100,000 COLONIES/mL ESCHERICHIA COLI  Culture, blood (Routine X 2) w Reflex to ID Panel     Status: None   Collection Time: 04/26/23 11:17 AM   Specimen: BLOOD  Result Value Ref Range Status   Specimen Description BLOOD BLOOD RIGHT HAND  Final   Special Requests   Final    AEROBIC BOTTLE ONLY Blood Culture results may not be optimal due to an inadequate volume of blood received in culture bottles   Culture   Final    NO GROWTH 5 DAYS Performed at [facility], [address] [place], [place] [address]    Report Status 05/01/2023 FINAL  Final  Culture, blood (Routine X 2) w Reflex to ID Panel     Status: None   Collection Time: 04/26/23 11:17 AM   Specimen: BLOOD  Result Value Ref Range Status   Specimen Description BLOOD BLOOD LEFT ARM  Final   Special Requests   Final    AEROBIC BOTTLE ONLY Blood Culture results may not be optimal due to an inadequate volume of blood received in culture bottles   Culture   Final    NO GROWTH 5 DAYS Performed at [facility], [address], [place], [place] [address]    Report Status 05/01/2023 FINAL  Final  MRSA Next Gen by PCR, Nasal     Status: None   Collection Time: 05/01/23  2:00 AM   Specimen: Nasal Mucosa; Nasal Swab  Result Value Ref Range Status   MRSA by PCR Next  Gen NOT DETECTED NOT DETECTED Final    Comment: (NOTE) The GeneXpert MRSA Assay (FDA approved for NASAL specimens only), is one component of a comprehensive MRSA colonization surveillance program. It is not intended to diagnose MRSA infection nor to guide or monitor treatment for MRSA infections. Test performance is not FDA approved in patients less than 6 years old. Performed at [facility], [address] [place], [place] [address]       Radiology Studies: ECHOCARDIOGRAM COMPLETE Result Date: 05/02/2023    ECHOCARDIOGRAM REPORT   Patient Name:   Brandy Gonzales Date of Exam: 05/02/2023 Medical Rec #:  [id number]    Height:  59.0 in Accession #:    [id number]   Weight:       110.2 lb Date of Birth:  Mar 31, 1941    BSA:          1.432 m Patient Age:    76 years     BP:           169/84 mmHg Patient Gender: F            HR:           73 bpm. Exam Location:  Inpatient Procedure: 2D Echo, Cardiac Doppler and Color Doppler Indications:    CP  History:        Patient has prior history of Echocardiogram examinations, most                 recent 10/15/2022. Signs/Symptoms:Syncope; Risk Factors:Diabetes                 and Hypertension.  Sonographer:    Vern Claude Referring Phys: [id number] RONDELL A Berman IMPRESSIONS  1. Calcified papillary muscle. Left ventricular ejection fraction, by estimation, is 60 to 65%. The left ventricle has normal function. The left ventricle has no regional wall motion abnormalities. Left ventricular diastolic parameters are consistent with Grade I diastolic dysfunction (impaired relaxation).  2. Right ventricular systolic function is normal. The right ventricular size is normal.  3. The mitral valve is degenerative. No evidence of mitral valve regurgitation. The mean mitral valve gradient is 3.0 mmHg.  4. Forme Fruste Bicuspid related to right-non calcification with normal stroke volume index and DVI. The aortic valve is bicuspid. Aortic valve regurgitation is not  visualized.  5. The inferior vena cava is normal in size with greater than 50% respiratory variability, suggesting right atrial pressure of 3 mmHg. Comparison(s): No significant change from prior study. FINDINGS  Left Ventricle: Calcified papillary muscle. Left ventricular ejection fraction, by estimation, is 60 to 65%. The left ventricle has normal function. The left ventricle has no regional wall motion abnormalities. The left ventricular internal cavity size was normal in size. There is no left ventricular hypertrophy. Left ventricular diastolic parameters are consistent with Grade I diastolic dysfunction (impaired relaxation). Right Ventricle: The right ventricular size is normal. No increase in right ventricular wall thickness. Right ventricular systolic function is normal. Left Atrium: Left atrial size was normal in size. Right Atrium: Right atrial size was normal in size. Pericardium: There is no evidence of pericardial effusion. Mitral Valve: The mitral valve is degenerative in appearance. Mild mitral annular calcification. No evidence of mitral valve regurgitation. MV peak gradient, 9.2 mmHg. The mean mitral valve gradient is 3.0 mmHg. Tricuspid Valve: The tricuspid valve is normal in structure. Tricuspid valve regurgitation is trivial. No evidence of tricuspid stenosis. Aortic Valve: Forme Fruste Bicuspid related to right-non calcification with normal stroke volume index and DVI. The aortic valve is bicuspid. Aortic valve regurgitation is not visualized. Aortic valve mean gradient measures 3.0 mmHg. Aortic valve peak gradient measures 6.1 mmHg. Aortic valve area, by VTI measures 1.49 cm. Pulmonic Valve: The pulmonic valve was normal in structure. Pulmonic valve regurgitation is not visualized. No evidence of pulmonic stenosis. Aorta: The aortic root and ascending aorta are structurally normal, with no evidence of dilitation. Venous: The inferior vena cava is normal in size with greater than 50%  respiratory variability, suggesting right atrial pressure of 3 mmHg. IAS/Shunts: The atrial septum is grossly normal.  LEFT VENTRICLE PLAX 2D LVIDd:  4.00 cm     Diastology LVIDs:         2.80 cm     LV e' medial:    5.77 cm/s LV PW:         1.00 cm     LV E/e' medial:  11.4 LV IVS:        0.80 cm     LV e' lateral:   4.46 cm/s LVOT diam:     1.60 cm     LV E/e' lateral: 14.7 LV SV:         38 LV SV Index:   27 LVOT Area:     2.01 cm  LV Volumes (MOD) LV vol d, MOD A2C: 58.1 ml LV vol d, MOD A4C: 71.7 ml LV vol s, MOD A2C: 26.5 ml LV vol s, MOD A4C: 32.8 ml LV SV MOD A2C:     31.6 ml LV SV MOD A4C:     71.7 ml LV SV MOD BP:      37.5 ml RIGHT VENTRICLE            IVC RV Basal diam:  2.30 cm    IVC diam: 1.20 cm RV Mid diam:    1.90 cm RV S prime:     9.03 cm/s TAPSE (M-mode): 1.8 cm LEFT ATRIUM           Index        RIGHT ATRIUM          Index LA diam:      2.40 cm 1.68 cm/m   RA Area:     6.66 cm LA Vol (A2C): 36.8 ml 25.70 ml/m  RA Volume:   10.80 ml 7.54 ml/m LA Vol (A4C): 14.6 ml 10.20 ml/m  AORTIC VALVE                    PULMONIC VALVE AV Area (Vmax):    1.45 cm     PV Vmax:       0.94 m/s AV Area (Vmean):   1.61 cm     PV Peak grad:  3.6 mmHg AV Area (VTI):     1.49 cm AV Vmax:           123.00 cm/s AV Vmean:          78.600 cm/s AV VTI:            0.257 m AV Peak Grad:      6.1 mmHg AV Mean Grad:      3.0 mmHg LVOT Vmax:         88.90 cm/s LVOT Vmean:        63.000 cm/s LVOT VTI:          0.190 m LVOT/AV VTI ratio: 0.74  AORTA Ao Root diam: 2.70 cm Ao Asc diam:  2.90 cm MITRAL VALVE MV Area (PHT): 2.56 cm     SHUNTS MV Area VTI:   1.49 cm     Systemic VTI:  0.19 m MV Peak grad:  9.2 mmHg     Systemic Diam: 1.60 cm MV Mean grad:  3.0 mmHg MV Vmax:       1.52 m/s MV Vmean:      73.6 cm/s MV Decel Time: 296 msec MV E velocity: 65.70 cm/s MV A velocity: 124.00 cm/s MV E/A ratio:  0.53 Riley Lam MD Electronically signed by Riley Lam MD Signature Date/Time:  05/02/2023/1:59:53 PM    Final    VAS [place] LOWER EXTREMITY  VENOUS (DVT) Result Date: 05/02/2023  Lower Venous DVT Study Patient Name:  TYRICA STETSER  Date of Exam:   05/01/2023 Medical Rec #: [id number]     Accession #:    [id number] Date of Birth: April 13, 1941     Patient Gender: F Patient Age:   43 years Exam Location:  [facility] Procedure:      VAS [place] LOWER EXTREMITY VENOUS (DVT) Referring Phys: Madelyn Flavors --------------------------------------------------------------------------------  Indications: Swelling, and chest pain.  Risk Factors: DVT DVT 05/04/22 left CFV, FV, Profunda vein, popliteal vein. Comparison Study: Prior negative left LEV done 08/07/22. Performing Technologist: Sherren Kerns RVS  Examination Guidelines: A complete evaluation includes B-mode imaging, spectral Doppler, color Doppler, and power Doppler as needed of all accessible portions of each vessel. Bilateral testing is considered an integral part of a complete examination. Limited examinations for reoccurring indications may be performed as noted. The reflux portion of the exam is performed with the patient in reverse Trendelenburg.  +---------+---------------+---------+-----------+---------------+-------------+ RIGHT    CompressibilityPhasicitySpontaneityProperties     Thrombus                                                                 Aging         +---------+---------------+---------+-----------+---------------+-------------+ CFV      Full           Yes      No         pulsatile                                                                waveform                     +---------+---------------+---------+-----------+---------------+-------------+ SFJ      Full                                                            +---------+---------------+---------+-----------+---------------+-------------+ FV Prox  Full                                                             +---------+---------------+---------+-----------+---------------+-------------+ FV Mid   Full                                                            +---------+---------------+---------+-----------+---------------+-------------+ FV DistalFull                                                            +---------+---------------+---------+-----------+---------------+-------------+  PFV      Full                                                            +---------+---------------+---------+-----------+---------------+-------------+ POP      Full           Yes      No         pulsatile                                                                waveform                     +---------+---------------+---------+-----------+---------------+-------------+ PTV      Full                                                            +---------+---------------+---------+-----------+---------------+-------------+ PERO     Full                                                            +---------+---------------+---------+-----------+---------------+-------------+   +---------+---------------+---------+-----------+----------+-------------------+ LEFT     CompressibilityPhasicitySpontaneityPropertiesThrombus Aging      +---------+---------------+---------+-----------+----------+-------------------+ CFV      Full           Yes      Yes                                      +---------+---------------+---------+-----------+----------+-------------------+ SFJ      Full                                                             +---------+---------------+---------+-----------+----------+-------------------+ FV Prox  Full                                                             +---------+---------------+---------+-----------+----------+-------------------+ FV Mid   Full                                                              +---------+---------------+---------+-----------+----------+-------------------+ FV DistalFull  Yes      Yes                                      +---------+---------------+---------+-----------+----------+-------------------+ PFV      Full                                                             +---------+---------------+---------+-----------+----------+-------------------+ POP      Full           Yes      Yes                                      +---------+---------------+---------+-----------+----------+-------------------+ PTV                                                   Not well visualized +---------+---------------+---------+-----------+----------+-------------------+ PERO     Full                                                             +---------+---------------+---------+-----------+----------+-------------------+     Summary: BILATERAL: - No evidence of deep vein thrombosis seen in the lower extremities, bilaterally. -No evidence of popliteal cyst, bilaterally.   *See table(s) above for measurements and observations. Electronically signed by Coral Else MD on 05/02/2023 at 12:12:45 AM.    Final       LOS: 2 days    Jacquelin Hawking, MD [facility] 05/03/2023, 9:43 AM   If 7PM-7AM, please contact night-coverage [email or web address]

## 2023-05-03 NOTE — Discharge Instructions (Signed)
Brandy Gonzales,  You are in the hospital because of some chest pain.  We tried to evaluate what was going on but did not find anything dangerous that was causing your chest pain.  Thankfully, your symptoms have improved.  You did have some elevated blood pressure which could have contributed but this is unclear.  Your blood pressure medications were adjusted.  Please follow-up with your primary care doctor.

## 2023-05-03 NOTE — TOC Initial Note (Signed)
Transition of Care [facility]) - Initial/Assessment Note    Patient Details  Name: Brandy Gonzales MRN: [id number] Date of Birth: [birth year]  Transition of Care [facility]) CM/SW Contact:    Harriet Masson, RN Phone Number: 05/03/2023, 3:36 PM  Clinical Narrative:                  Sherron Monday to [name], daughter and POA, regarding transition needs.  Patient lives with son and is active with [facility] for PT, RN.  Notified Cheryl with [facility] of admit.  Patient has all needed DME. Address, Phone number and PCP verified.  Patient will need resumption home health PT OT orders   Expected Discharge Plan: Home w Home Health Services Barriers to Discharge: Continued Medical Work up   Patient Goals and CMS Choice Patient states their goals for this hospitalization and ongoing recovery are:: daugther, POA,donna  wants patient to return home CMS Medicare.gov Compare Post Acute Care list provided to:: Patient Represenative (must comment) Choice offered to / list presented to : [facility] POA / Guardian      Expected Discharge Plan and Services                                   HH Arranged: PT, RN [facility] Agency: [facility] Date [facility] Agency Contacted: 05/03/23 Time HH Agency Contacted: 1536 Representative spoke with at [facility] Agency: [name]  Prior Living Arrangements/Services     Patient language and need for interpreter reviewed:: Yes Do you feel safe going back to the place where you live?: Yes      Need for Family Participation in Patient Care: Yes (Comment) Care giver support system in place?: Yes (comment)   Criminal Activity/Legal Involvement Pertinent to Current Situation/Hospitalization: No - Comment as needed  Activities of Daily Living   ADL Screening (condition at time of admission) Independently performs ADLs?: No Does the patient have a NEW difficulty with bathing/dressing/toileting/self-feeding that is expected to last >3 days?: No Does the patient have a NEW  difficulty with getting in/out of bed, walking, or climbing stairs that is expected to last >3 days?: No Does the patient have a NEW difficulty with communication that is expected to last >3 days?: No Is the patient deaf or have difficulty hearing?: Yes Does the patient have difficulty seeing, even when wearing glasses/contacts?: No Does the patient have difficulty concentrating, remembering, or making decisions?: Yes  Permission Sought/Granted                  Emotional Assessment         Alcohol / Substance Use: Not Applicable Psych Involvement: No (comment)  Admission diagnosis:  Hypertensive emergency [I16.1] Chest pain [R07.9] Patient Active Problem List   Diagnosis Date Noted   Elevated d-dimer 05/01/2023   Hypertensive emergency 05/01/2023   Chest pain 04/30/2023   Hypertensive urgency 04/30/2023   History of pyelonephritis 04/30/2023   Controlled type 2 diabetes mellitus without complication, without long-term current use of insulin (HCC) 04/30/2023   Constipation 04/26/2023   Debility 04/26/2023   Pyelonephritis 04/25/2023   Seizure disorder (HCC) 04/01/2023   Alzheimer's dementia (HCC) 02/10/2023   Metabolic encephalopathy 02/08/2023   Unspecified Escherichia coli (E. coli) as the cause of diseases classified elsewhere 02/08/2023   History of Bell's palsy 10/15/2022   Hematoma of left foot 10/15/2022   Hematoma 10/09/2022   Normocytic anemia 06/11/2022   History of DVT (  deep vein thrombosis) 05/18/2022   Current long-term use of anticoagulant medication with history of deep venous thrombosis 04/2022 (DVT) 05/18/2022   History of insertion of IVC (inferior vena caval) filter 2006 05/18/2022   Swelling of left foot 05/18/2022   Type 2 diabetes mellitus with diabetic chronic kidney disease (HCC) 05/06/2022   Long term (current) use of oral hypoglycemic drugs 05/06/2022   Protein-calorie malnutrition, severe (HCC) 04/28/2022   Dyslipidemia 04/11/2020    Abdominal enlargement 04/05/2018   Stage 3b chronic kidney disease (CKD) (HCC) 11/30/2017   Leg wound, right 08/08/2015   OAB (overactive bladder) 04/22/2015   Leg wound, left 11/01/2014   Edema 11/01/2014   Seborrheic dermatitis 09/26/2013   Dysphagia 01/23/2013   Syncope 10/03/2012   Well adult exam 09/22/2012   Nonspecific abnormal electrocardiogram (ECG) (EKG) 09/22/2012   Osteoarthritis 05/19/2012   Glaucoma 09/10/2011   Dysuria 07/17/2009   Diabetic nephropathy (HCC) 06/27/2008   HTN (hypertension) 04/27/2007   GERD (gastroesophageal reflux disease) 04/27/2007   Long term (current) use of aspirin 05/12/1999   Personal history of (healed) traumatic fracture 05/12/1999   Personal history of other venous thrombosis and embolism 05/12/1999   Presence of other vascular implants and grafts 05/12/1999   PCP:  [name], [name], MD Pharmacy:   [id number] [place], [place] - [address] AT [facility] OF [address] [place] [address] Phone: [phone] Fax: [phone]  [id number] - [place] - [address] [place] [address] Phone: [phone] Fax: [phone]  [facility] #[id number] [place] Ditto, [place] - [address] AT [facility] OF [address] [place] [address] [place] [address] Phone: [phone] Fax: [phone]  [facility] [address] [place] [address] Phone: [phone] Fax: [phone]     Social Drivers of Health (SDOH) Social History: SDOH Screenings   Food Insecurity: No Food Insecurity (05/01/2023)  Recent Concern: Food Insecurity - Food Insecurity Present (04/13/2023)  Housing: Low Risk  (05/01/2023)  Transportation Needs: No Transportation Needs (05/01/2023)  Utilities: Not At Risk (05/01/2023)  Alcohol Screen: Low Risk  (03/24/2021)  Depression (PHQ2-9): High Risk  (04/13/2023)  Financial Resource Strain: Medium Risk (04/13/2023)  Physical Activity: Unknown (04/13/2023)  Social Connections: Socially Isolated (04/13/2023)  Stress: No Stress Concern Present (04/13/2023)  Tobacco Use: Low Risk  (04/30/2023)   SDOH Interventions:     Readmission Risk Interventions    06/12/2022   11:19 AM 05/20/2022    4:35 PM  Readmission Risk Prevention Plan  Transportation Screening Complete Complete  PCP or Specialist Appt within 3-5 Days Complete Complete  HRI or Home Care Consult Complete --  Social Work Consult for Recovery Care Planning/Counseling Complete Complete  Palliative Care Screening Not Applicable Not Applicable  Medication Review Oceanographer) Complete Complete

## 2023-05-03 NOTE — Plan of Care (Signed)
  Problem: Education: Goal: Knowledge of General Education information will improve Description: Including pain rating scale, medication(s)/side effects and non-pharmacologic comfort measures 05/03/2023 0352 by Billey Gosling D, RN Outcome: Progressing 05/03/2023 0352 by Excell Seltzer, Najib Colmenares D, RN Outcome: Progressing   Problem: Health Behavior/Discharge Planning: Goal: Ability to manage health-related needs will improve 05/03/2023 0352 by Billey Gosling D, RN Outcome: Progressing 05/03/2023 0352 by Excell Seltzer, Ivianna Notch D, RN Outcome: Progressing   Problem: Clinical Measurements: Goal: Ability to maintain clinical measurements within normal limits will improve 05/03/2023 0352 by Excell Seltzer, Teiara Baria D, RN Outcome: Progressing 05/03/2023 0352 by Excell Seltzer, Prabhjot Piscitello D, RN Outcome: Progressing Goal: Will remain free from infection 05/03/2023 0352 by Excell Seltzer, Yumalay Circle D, RN Outcome: Progressing 05/03/2023 0352 by Excell Seltzer Jada Fass D, RN Outcome: Progressing Goal: Diagnostic test results will improve 05/03/2023 0352 by Billey Gosling D, RN Outcome: Progressing 05/03/2023 0352 by Excell Seltzer, Jadis Pitter D, RN Outcome: Progressing Goal: Respiratory complications will improve 05/03/2023 0352 by Billey Gosling D, RN Outcome: Progressing 05/03/2023 0352 by Excell Seltzer, Zakyia Gagan D, RN Outcome: Progressing Goal: Cardiovascular complication will be avoided 05/03/2023 0352 by Billey Gosling D, RN Outcome: Progressing 05/03/2023 0352 by Billey Gosling D, RN Outcome: Progressing   Problem: Activity: Goal: Risk for activity intolerance will decrease 05/03/2023 0352 by Billey Gosling D, RN Outcome: Progressing 05/03/2023 0352 by Excell Seltzer, Mirella Gueye D, RN Outcome: Progressing   Problem: Nutrition: Goal: Adequate nutrition will be maintained 05/03/2023 0352 by Billey Gosling D, RN Outcome: Progressing 05/03/2023 0352 by Excell Seltzer, Merryl Buckels D, RN Outcome: Progressing   Problem: Coping: Goal: Level of anxiety will decrease 05/03/2023 0352 by Billey Gosling D, RN Outcome: Progressing 05/03/2023 0352 by Excell Seltzer, Jennie Bolar D, RN Outcome: Progressing   Problem: Elimination: Goal: Will not experience complications related to bowel motility 05/03/2023 0352 by Billey Gosling D, RN Outcome: Progressing 05/03/2023 0352 by Excell Seltzer, Daizy Outen D, RN Outcome: Progressing Goal: Will not experience complications related to urinary retention 05/03/2023 0352 by Billey Gosling D, RN Outcome: Progressing 05/03/2023 0352 by Excell Seltzer, Ediel Unangst D, RN Outcome: Progressing   Problem: Pain Management: Goal: General experience of comfort will improve 05/03/2023 0352 by Billey Gosling D, RN Outcome: Progressing 05/03/2023 0352 by Excell Seltzer, Addalyne Vandehei D, RN Outcome: Progressing   Problem: Safety: Goal: Ability to remain free from injury will improve 05/03/2023 0352 by Excell Seltzer, Calen Geister D, RN Outcome: Progressing 05/03/2023 0352 by Excell Seltzer, Jahfari Ambers D, RN Outcome: Progressing   Problem: Skin Integrity: Goal: Risk for impaired skin integrity will decrease 05/03/2023 0352 by Billey Gosling D, RN Outcome: Progressing 05/03/2023 0352 by Excell Seltzer, Lakina Mcintire D, RN Outcome: Progressing   Problem: Education: Goal: Understanding of cardiac disease, CV risk reduction, and recovery process will improve 05/03/2023 0352 by Billey Gosling D, RN Outcome: Progressing 05/03/2023 0352 by Billey Gosling D, RN Outcome: Progressing Goal: Individualized Educational Video(s) 05/03/2023 0352 by Billey Gosling D, RN Outcome: Progressing 05/03/2023 0352 by Billey Gosling D, RN Outcome: Progressing   Problem: Activity: Goal: Ability to tolerate increased activity will improve 05/03/2023 0352 by Billey Gosling D, RN Outcome: Progressing 05/03/2023 0352 by Excell Seltzer, Cassandr Cederberg D, RN Outcome: Progressing   Problem: Cardiac: Goal: Ability to achieve and maintain adequate cardiovascular perfusion will improve 05/03/2023 0352 by Billey Gosling D, RN Outcome: Progressing 05/03/2023 0352 by Excell Seltzer Habib Kise D,  RN Outcome: Progressing   Problem: Health Behavior/Discharge Planning: Goal: Ability to safely manage health-related needs after discharge will improve 05/03/2023 0352 by Billey Gosling D, RN Outcome: Progressing 05/03/2023 0352 by Excell Seltzer Galileah Piggee D, RN Outcome: Progressing

## 2023-05-03 NOTE — Evaluation (Signed)
Physical Therapy Evaluation Patient Details Name: Brandy Gonzales MRN: [id number] DOB: 1941/01/05 Today's Date: 05/03/2023  History of Present Illness  82 yo female admitted 12/20 with chest pain and left flank swelling. PMhx: recent admit 12/15-12/18 with pyelonephritis, generalized weakness. Dementia, HTN, GERD, CKD, anemia, DVT, glaucoma, seizure  Clinical Impression  Pt pleasantly confused, lives at home with family assist for all mobility, ADLs and IADLs. Per daughter she will walk with HHPT and family gets her in [facility] and to the shower on MWF. Pt with baseline dementia which inhibits ability to progress function and for pt to progress mobility on her own. Pt with decreased strength and function who will benefit from acute therapy trial to maximize strength and function. HHPT recommended at D/C with continued 24hr care from family.   HR 79-88 SpO2 93% on RA        If plan is discharge home, recommend the following: Two people to help with walking and/or transfers;A lot of help with bathing/dressing/bathroom;Assistance with cooking/housework;Direct supervision/assist for medications management;Direct supervision/assist for financial management;Assist for transportation;Help with stairs or ramp for entrance;Supervision due to cognitive status   Can travel by private vehicle        Equipment Recommendations None recommended by PT  Recommendations for Other Services       Functional Status Assessment Patient has had a recent decline in their functional status and/or demonstrates limited ability to make significant improvements in function in a reasonable and predictable amount of time     Precautions / Restrictions Precautions Precautions: Fall Precaution Comments: dementia      Mobility  Bed Mobility Overal bed mobility: Needs Assistance Bed Mobility: Rolling, Sidelying to Sit Rolling: Mod assist Sidelying to sit: Mod assist       General bed mobility comments: mod assist to  initiate moving legs toward EOB, roll to left and rise from surface with increased time and cues    Transfers Overall transfer level: Needs assistance   Transfers: Sit to/from Stand, Bed to chair/wheelchair/BSC Sit to Stand: Mod assist Stand pivot transfers: Mod assist         General transfer comment: mod assist to rise from bed with cues for hand placement and physical assist to rise from surface. Use of RW to stand and pivot to RW. Pt halfway through pivot when unable to continue stepping and recliner pulled to pt. Max +2 to scoot back in chair    Ambulation/Gait               General Gait Details: unable without +2 assist  Stairs            Wheelchair Mobility     Tilt Bed    Modified Rankin (Stroke Patients Only)       Balance Overall balance assessment: Needs assistance   Sitting balance-Leahy Scale: Fair Sitting balance - Comments: EOB with left lean with supervision, min cues for static sitting in midline   Standing balance support: Bilateral upper extremity supported Standing balance-Leahy Scale: Poor Standing balance comment: RW and mod physical assist from therapist                             Pertinent Vitals/Pain Pain Assessment Pain Assessment: No/denies pain    Home Living Family/patient expects to be discharged to:: Private residence Living Arrangements: Other relatives Available Help at Discharge: Family;Available 24 hours/day Type of Home: House Home Access: Ramped entrance  Home Layout: One level Home Equipment: Agricultural consultant (2 wheels);Rollator (4 wheels);Standard Walker;Cane - single point;BSC/3in1;Wheelchair - manual;Hospital bed      Prior Function Prior Level of Function : Needs assist             Mobility Comments: granndaughter stays with pt and helps her stand to [facility] on MWF for transport to shower, otherwise pt stays in bed. daughter reports pt was working with HHPT and will walk to the porch  with them ADLs Comments: significant assist for all ADLs, wears briefs, able to self feed with cues.     Extremity/Trunk Assessment   Upper Extremity Assessment Upper Extremity Assessment: Generalized weakness    Lower Extremity Assessment Lower Extremity Assessment: Generalized weakness    Cervical / Trunk Assessment Cervical / Trunk Assessment: Kyphotic  Communication   Communication Communication: Hearing impairment Cueing Techniques: Verbal cues;Gestural cues;Tactile cues  Cognition Arousal: Alert Behavior During Therapy: WFL for tasks assessed/performed Overall Cognitive Status: History of cognitive impairments - at baseline                                 General Comments: oriented to person and place not situation or time        General Comments      Exercises     Assessment/Plan    PT Assessment Patient needs continued PT services  PT Problem List Decreased strength;Decreased activity tolerance;Decreased balance;Decreased mobility;Decreased cognition;Decreased knowledge of use of DME;Decreased safety awareness       PT Treatment Interventions DME instruction;Gait training;Functional mobility training;Therapeutic activities;Therapeutic exercise;Balance training;Neuromuscular re-education;Cognitive remediation;Patient/family education    PT Goals (Current goals can be found in the Care Plan section)  Acute Rehab PT Goals Patient Stated Goal: return home, family wanting to continue HHPT PT Goal Formulation: With patient Time For Goal Achievement: 05/17/23 Potential to Achieve Goals: Fair    Frequency Min 1X/week     Co-evaluation               AM-PAC PT "6 Clicks" Mobility  Outcome Measure Help needed turning from your back to your side while in a flat bed without using bedrails?: A Lot Help needed moving from lying on your back to sitting on the side of a flat bed without using bedrails?: A Lot Help needed moving to and from a bed  to a chair (including a wheelchair)?: A Lot Help needed standing up from a chair using your arms (e.g., wheelchair or bedside chair)?: A Lot Help needed to walk in hospital room?: Total Help needed climbing 3-5 steps with a railing? : Total 6 Click Score: 10    End of Session   Activity Tolerance: Patient tolerated treatment well Patient left: in chair;with call bell/phone within reach;with chair alarm set Nurse Communication: Mobility status PT Visit Diagnosis: Unsteadiness on feet (R26.81);Other abnormalities of gait and mobility (R26.89);Muscle weakness (generalized) (M62.81)    Time: 1610-9604 PT Time Calculation (min) (ACUTE ONLY): 16 min   Charges:   PT Evaluation $PT Eval Moderate Complexity: 1 Mod   PT General Charges $$ ACUTE PT VISIT: 1 Visit         Merryl Hacker, PT Acute Rehabilitation Services Office: [phone]   Enedina Finner Gaberiel Youngblood 05/03/2023, 1:05 PM

## 2023-05-03 NOTE — TOC Transition Note (Signed)
Transition of Care [facility]) - Discharge Note   Patient Details  Name: Brandy Gonzales MRN: [id number] Date of Birth: [birth year]  [facility]) CM/SW Contact:  Harriet Masson, RN Phone Number: 05/03/2023, 4:12 PM   Clinical Narrative:    Patient stable for discharge.  [name] with [facility] aware of discharge.  Resumption orders placed.  Patient's daughter can transport home.    Final next level of care: Home w Home Health Services Barriers to Discharge: Barriers Resolved   Patient Goals and CMS Choice Patient states their goals for this hospitalization and ongoing recovery are:: daugther, POA,donna  wants patient to return home CMS Medicare.gov Compare Post Acute Care list provided to:: Patient Represenative (must comment) Choice offered to / list presented to : [facility] POA / Guardian      Discharge Placement               home        Discharge Plan and Services Additional resources added to the After Visit Summary for                            [facility] Arranged: PT, RN [facility] Agency: [facility] Date [facility] Agency Contacted: 05/03/23 Time HH Agency Contacted: 1536 Representative spoke with at [facility] Agency: [name]  Social Drivers of Health (SDOH) Interventions SDOH Screenings   Food Insecurity: No Food Insecurity (05/01/2023)  Recent Concern: Food Insecurity - Food Insecurity Present (04/13/2023)  Housing: Low Risk  (05/01/2023)  Transportation Needs: No Transportation Needs (05/01/2023)  Utilities: Not At Risk (05/01/2023)  Alcohol Screen: Low Risk  (03/24/2021)  Depression (PHQ2-9): High Risk (04/13/2023)  Financial Resource Strain: Medium Risk (04/13/2023)  Physical Activity: Unknown (04/13/2023)  Social Connections: Socially Isolated (04/13/2023)  Stress: No Stress Concern Present (04/13/2023)  Tobacco Use: Low Risk  (04/30/2023)     Readmission Risk Interventions    05/03/2023    4:12 PM 06/12/2022   11:19 AM 05/20/2022    4:35 PM   Readmission Risk Prevention Plan  Transportation Screening  Complete Complete  PCP or Specialist Appt within 3-5 Days Complete Complete Complete  HRI or Home Care Consult Complete Complete --  Social Work Consult for Recovery Care Planning/Counseling Complete Complete Complete  Palliative Care Screening Not Applicable Not Applicable Not Applicable  Medication Review Oceanographer) Complete Complete Complete

## 2023-05-04 ENCOUNTER — Telehealth: Payer: Self-pay | Admitting: *Deleted

## 2023-05-04 NOTE — Transitions of Care (Post Inpatient/ED Visit) (Signed)
05/04/2023  Name: Brandy Gonzales MRN: [id number] DOB: July 31, 1940  Today's TOC FU Call Status: Today's TOC FU Call Status:: Successful TOC FU Call Completed TOC FU Call Complete Date: 05/04/23 Patient's Name and Date of Birth confirmed.  Transition Care Management Follow-up Telephone Call Date of Discharge: 05/03/23 Discharge Facility: [facility]) Type of Discharge: Inpatient Admission Primary Inpatient Discharge Diagnosis:: HTN urgency/ crisis with chest pain How have you been since you were released from the hospital?: Better (per daughter/ caregiver/ HCPOA: "She is fine.  I guess she is having a hard time right now around her health; but she is fine at home.  The granddaughters are still helping her with everything and I manage all of her medicines and supervise everything") Any questions or concerns?: No  Items Reviewed: Did you receive and understand the discharge instructions provided?: Yes (thoroughly reviewed with patient's caregiver/ daughter who verbalizes good understanding of same) Medications obtained,verified, and reconciled?: Partial Review Completed (Partial medication review completed; daughter/ caregiver HCPOA manages medications and denies questions/ concerns around medications today) Reason for Partial Mediation Review: Daughter declines full review: she is at work and declines Any new allergies since your discharge?: No Dietary orders reviewed?: Yes Type of Diet Ordered:: "Whatever she wants" Do you have support at home?: Yes People in Home: grandchild(ren), child(ren), adult Name of Support/Comfort Primary Source: Daughter/ caregiver reports patient requires assistance with all care needs; resides with 2 adult granddaughters who provide all care assistance; patient's daughter/ HCPOA Brandy Gonzales is primary caregiver- manages all aspects of patient's care at home/ medications/ supervises all care activities- she reports other local family members also assist as  needed  Medications Reviewed Today: Medications Reviewed Today     Reviewed by Michaela Corner, RN (Registered Nurse) on 05/04/23 at 1625  Med List Status: <None>   Medication Order Taking? Sig Documenting Provider Last Dose Status Informant  acetaminophen (TYLENOL) 500 MG tablet [id number]  Take 500-1,000 mg by mouth every 6 (six) hours as needed for moderate pain (pain score 4-6).  Patient not taking: Reported on 04/30/2023   [provider]  Active Child, Pharmacy Records  aspirin EC 81 MG tablet [id number]  Take 81 mg by mouth daily. Swallow whole. [provider]  Active Child, Pharmacy Records           Med Note (CRUTHIS, CHLOE C   Sat Jan 30, 2023  9:25 AM)    carvedilol (COREG) 25 MG tablet [id number] Yes TAKE 1 TABLET(25 MG) BY MOUTH TWICE DAILY WITH A MEAL  Patient taking differently: Take 25 mg by mouth daily.   Gonzales, Brandy Quint, MD Taking Active Child, Pharmacy Records           Med Note [name]   Fri Apr 30, 2023 10:07 AM) Daughter states MD directed to give patient 12.5mg  BID. Daughter reports giving 25mg  once daily in the morning.   cefadroxil (DURICEF) 500 MG capsule [id number]  Take 1 capsule (500 mg total) by mouth daily for 5 days. Brandy Bong, MD  Active Child, Pharmacy Records           Med Note [name]   Fri Apr 30, 2023 10:00 AM) Mora Appl 12/19.   glimepiride (AMARYL) 2 MG tablet [id number]  Take 0.5 tablets (1 mg total) by mouth daily before breakfast. Gonzales, Brandy Quint, MD  Active Child, Pharmacy Records  Glucagon (GVOKE HYPOPEN 1-PACK) 1 MG/0.2ML SOAJ [id number]  Inject 0.2 mLs into the skin daily as needed.  Gonzales, Brandy Quint, MD  Active Child, Pharmacy Records           Med Note [name]   Fri Apr 30, 2023 10:03 AM) Loreli Slot last dose.   hydrALAZINE (APRESOLINE) 50 MG tablet [id number] No Take 1 tablet (50 mg total) by mouth 3 (three) times daily.  Patient not taking: Reported on 05/04/2023   [name], MD Not Taking Active            Med Note Michaela Corner   Tue May 04, 2023  4:21 PM) 05/04/23: Daughter/ Caregiver reports during [facility] call that she is NOT giving patient this medication: states she discussed this with the hospital discharging provider- she is concerned about the side effects patient experienced when she was on it previously  levETIRAcetam (KEPPRA) 750 MG tablet [id number]  Take 1 tablet (750 mg total) by mouth 2 (two) times daily. Zigmund Daniel., MD  Active Child, Pharmacy Records  omeprazole (PRILOSEC) 20 MG capsule [id number]  TAKE 1 CAPSULE(20 MG) BY MOUTH DAILY Gonzales, Brandy Quint, MD  Active Child, Pharmacy Records  polyethylene glycol powder (GLYCOLAX/MIRALAX) 17 GM/SCOOP powder [id number]  Take 1 capful (17 g) with water by mouth daily.  Patient not taking: Reported on 04/30/2023   Brandy Bong, MD  Active Child, Pharmacy Records  senna-docusate [facility]) 8.6-50 MG tablet [id number]  Take 1 tablet by mouth 2 (two) times daily.  Patient not taking: Reported on 04/30/2023   Brandy Bong, MD  Active Child, Pharmacy Records  Med List Note Brandy Gonzales, [place] 01/30/23 [id number]): Daughter handles medications.             Home Care and Equipment/Supplies: Were Home Health Services Ordered?: Yes Name of Home Health Agency:: [facility] Has Agency set up a time to come to your home?: No EMR reviewed for Home Health Orders: Orders present/patient has not received call (refer to CM for follow-up) (daughter reports she has the phone number for home health agency- they were already active in patient's care prior to recent hospitalization- she declines need for assistance in initiating home health services; states :it's no rush around the holidays") Any new equipment or medical supplies ordered?: No  Functional Questionnaire: Do you need assistance with bathing/showering or dressing?: Yes (daughter and granddaughters provide assistance with all self-care and  mobility needs) Do you need assistance with meal preparation?: Yes (daughter and granddaughters provide assistance with all self-care and mobility needs) Do you need assistance with eating?: No Do you have difficulty maintaining continence: Yes (daughter and granddaughters provide assistance with all self-care and mobility needs) Do you need assistance with getting out of bed/getting out of a chair/moving?: Yes (daughter and granddaughters provide assistance with all self-care and mobility needs) Do you have difficulty managing or taking your medications?: Yes (Daughter manages all aspects of medication administration)  Follow up appointments reviewed: PCP Follow-up appointment confirmed?: Yes Date of PCP follow-up appointment?: 05/16/22 (daughter states she is going to cancel this appointment due to her work schedule; Contacted scheduling care guide to facilitate re-scheduling: daughter refused each option presented due to her work schedule- states she will call to schedule herself) Follow-up Provider: PCP- covering NP for [facility] Follow-up appointment confirmed?: NA Do you need transportation to your follow-up appointment?: No Do you understand care options if your condition(s) worsen?: Yes-patient verbalized understanding  SDOH Interventions Today    Flowsheet Row Most Recent Value  SDOH Interventions   Food Insecurity Interventions Intervention Not  Indicated  [daughter/ caregiver HCPOA denies food insecurity today]  Housing Interventions Intervention Not Indicated  Transportation Interventions Intervention Not Indicated  [daughter/ caregiver HCPOA provides all transportation]  Utilities Interventions Intervention Not Indicated       Goals Addressed             This Visit's Progress    TOC 30-day Program Care Plan   On track    Current Barriers:  Medication management daughter manages all aspects of medication management- not currently giving hydralazine- states  patient had prior side effects when taking this medication and she is not currently going to administer- reports taking carvedilol 25 mg BID  Provider appointments has HFU appointment with covering NP at PCP office for 05/16/22: however- daughter is working that day and needs to re-schedule: attempted to facilitate re-scheduling, however, none of the options presented worked for the daughter around her work schedule- daughter states she will call PCP office herself to schedule Home Health services [facility] has been ordered to resume services post-hospital discharge on 05/03/23- they were previously active/ established in patient's care; daughter declines need for assistance in ensuring they will resume services- states she has their phone number and will call them herself Equipment/DME Needs thorough assessment of DME  RNCM Clinical Goal(s):  Patient will work with the Care Management team over the next 30 days to address Transition of Care Barriers: Medication Management Support at home Provider appointments Home Health services Equipment/DME take all medications exactly as prescribed and will call provider for medication related questions as evidenced by caregiver reporting during [facility] 30-day program outreaches by telephone attend all scheduled medical appointments: 05/16/22- PCP: depending on what daughter is able to get re-scheduled, around her work schedule as evidenced by review of same with caregiver during [facility] 30-day program outreaches not experience hospital admission as evidenced by review of EMR. Hospital Admissions in last 6 months = 5  through collaboration with RN Care manager, provider, and care team.   Interventions: Evaluation of current treatment plan related to  self management and patient's adherence to plan as established by provider  Transitions of Care:  New goal. 05/04/23 Doctor Visits  - discussed the importance of doctor visits Attempted to facilitate re-scheduling of PCP HFU  office visit from 05/16/22 due to caregiver's stated work conflict: none of the options available/ presented would work around her work schedule- she states she will schedule herself when she takes patient to [facility] 05/13/23 Partial medication review completed- daughter agreeable to completion of full review next week Brief review/ education provided around mechanism of action for differing BP meds; value/ benefit of monitoring/ recording blood pressures at home- daughter reports they do not currently plan to monitor BP's at home and do not plan to start monitoring Role of home health services with importance of participation/ ongoing engagement Re- provided my direct contact information should questions/ concerns/ needs arise post-TOC call, prior to next RN CM telephone visit    Patient Goals/Self-Care Activities: Participate in Transition of Care Program/Attend TOC scheduled calls Take all medications as prescribed Attend all scheduled provider appointments Call provider office for new concerns or questions  Use assistive devices as needed to prevent falls Continue working with the home health team that is involved in your care If you believe your condition is getting worse- contact your care providers (doctors) promptly- reaching out to your doctor early when you have concerns can prevent you from having to go to the hospital Please take steps to schedule an  appointment with your primary care doctor as soon as possible for a hospital follow up office visit Please go over all of your medications and medication question/ concerns with your care providers (doctor)  Follow Up Plan:  Telephone follow up appointment with care management team member scheduled for:  Friday 05/14/23 at 11:00 am         Caryl Pina, RN, BSN, Media planner  Transitions of Care  [facility] - [facility] [phone]: direct office

## 2023-05-04 NOTE — Patient Instructions (Signed)
Visit Information  Thank you for taking time to visit with me today. Please don't hesitate to contact me if I can be of assistance to you before our next scheduled telephone appointment.  Our next appointment is by telephone on Friday 05/14/23 at 11:00 am  Please call the care guide team at [phone] if you need to cancel or reschedule your appointment.   Patient Goals/Self-Care Activities: Participate in Transition of Care Program/Attend TOC scheduled calls Take all medications as prescribed Attend all scheduled provider appointments Call provider office for new concerns or questions  Use assistive devices as needed to prevent falls Continue working with the home health team that is involved in your care If you believe your condition is getting worse- contact your care providers (doctors) promptly- reaching out to your doctor early when you have concerns can prevent you from having to go to the hospital Please take steps to schedule an appointment with your primary care doctor as soon as possible for a hospital follow up office visit Please go over all of your medications and medication question/ concerns with your care providers (doctor)   Following is a copy of your care plan:   Goals Addressed             This Visit's Progress    TOC 30-day Program Care Plan   On track    Current Barriers:  Medication management daughter manages all aspects of medication management- not currently giving hydralazine- states patient had prior side effects when taking this medication and she is not currently going to administer- reports taking carvedilol 25 mg BID  Provider appointments has HFU appointment with covering NP at PCP office for 05/16/22: however- daughter is working that day and needs to re-schedule: attempted to facilitate re-scheduling, however, none of the options presented worked for the daughter around her work schedule- daughter states she will call PCP office herself to  schedule Home Health services [facility] has been ordered to resume services post-hospital discharge on 05/03/23- they were previously active/ established in patient's care; daughter declines need for assistance in ensuring they will resume services- states she has their phone number and will call them herself Equipment/DME Needs thorough assessment of DME  RNCM Clinical Goal(s):  Patient will work with the Care Management team over the next 30 days to address Transition of Care Barriers: Medication Management Support at home Provider appointments Home Health services Equipment/DME take all medications exactly as prescribed and will call provider for medication related questions as evidenced by caregiver reporting during [facility] 30-day program outreaches by telephone attend all scheduled medical appointments: 05/16/22- PCP: depending on what daughter is able to get re-scheduled, around her work schedule as evidenced by review of same with caregiver during [facility] 30-day program outreaches not experience hospital admission as evidenced by review of EMR. Hospital Admissions in last 6 months = 5  through collaboration with RN Care manager, provider, and care team.   Interventions: Evaluation of current treatment plan related to  self management and patient's adherence to plan as established by provider  Transitions of Care:  New goal. 05/04/23 Doctor Visits  - discussed the importance of doctor visits Attempted to facilitate re-scheduling of PCP HFU office visit from 05/16/22 due to caregiver's stated work conflict: none of the options available/ presented would work around her work schedule- she states she will schedule herself when she takes patient to [facility] 05/13/23 Partial medication review completed- daughter agreeable to completion of full review next week Brief review/ education provided  around mechanism of action for differing BP meds; value/ benefit of monitoring/ recording blood pressures at home- daughter  reports they do not currently plan to monitor BP's at home and do not plan to start monitoring Role of home health services with importance of participation/ ongoing engagement Re- provided my direct contact information should questions/ concerns/ needs arise post-TOC call, prior to next RN CM telephone visit    Patient Goals/Self-Care Activities: Participate in Transition of Care Program/Attend TOC scheduled calls Take all medications as prescribed Attend all scheduled provider appointments Call provider office for new concerns or questions  Use assistive devices as needed to prevent falls Continue working with the home health team that is involved in your care If you believe your condition is getting worse- contact your care providers (doctors) promptly- reaching out to your doctor early when you have concerns can prevent you from having to go to the hospital Please take steps to schedule an appointment with your primary care doctor as soon as possible for a hospital follow up office visit Please go over all of your medications and medication question/ concerns with your care providers (doctor)  Follow Up Plan:  Telephone follow up appointment with care management team member scheduled for:  Friday 05/14/23 at 11:00 am         Patient verbalizes understanding of instructions and care plan provided today and agrees to view in MyChart. Active MyChart status and patient understanding of how to access instructions and care plan via MyChart confirmed with patient.     Telephone follow up appointment with care management team member scheduled for:  Friday 05/14/23 at 11:00 am   If you are experiencing a Mental Health or Behavioral Health Crisis or need someone to talk to, please  call the Suicide and Crisis Lifeline: 988 call the [place] National Suicide Prevention Lifeline: 7032360370 or TTY: 602 149 3571 TTY (437) 541-4086) to talk to a trained counselor call 1-800-273-TALK (toll free, 24 hour  hotline) go to [facility] [address], [place] [phone]) call the [facility] Crisis Line: 469-280-8196 call 911   Caryl Pina, RN, BSN, CCRN Alumnus RN Care Manager  Transitions of Care  [facility] - [facility]  [place] [phone]: direct office

## 2023-05-05 ENCOUNTER — Encounter: Payer: Self-pay | Admitting: Internal Medicine

## 2023-05-09 DIAGNOSIS — E785 Hyperlipidemia, unspecified: Secondary | ICD-10-CM | POA: Diagnosis not present

## 2023-05-09 DIAGNOSIS — E538 Deficiency of other specified B group vitamins: Secondary | ICD-10-CM | POA: Diagnosis not present

## 2023-05-09 DIAGNOSIS — I130 Hypertensive heart and chronic kidney disease with heart failure and stage 1 through stage 4 chronic kidney disease, or unspecified chronic kidney disease: Secondary | ICD-10-CM | POA: Diagnosis not present

## 2023-05-09 DIAGNOSIS — Z7984 Long term (current) use of oral hypoglycemic drugs: Secondary | ICD-10-CM | POA: Diagnosis not present

## 2023-05-09 DIAGNOSIS — R4189 Other symptoms and signs involving cognitive functions and awareness: Secondary | ICD-10-CM | POA: Diagnosis not present

## 2023-05-09 DIAGNOSIS — F0280 Dementia in other diseases classified elsewhere without behavioral disturbance: Secondary | ICD-10-CM | POA: Diagnosis not present

## 2023-05-09 DIAGNOSIS — E1165 Type 2 diabetes mellitus with hyperglycemia: Secondary | ICD-10-CM | POA: Diagnosis not present

## 2023-05-09 DIAGNOSIS — K219 Gastro-esophageal reflux disease without esophagitis: Secondary | ICD-10-CM | POA: Diagnosis not present

## 2023-05-09 DIAGNOSIS — G9341 Metabolic encephalopathy: Secondary | ICD-10-CM | POA: Diagnosis not present

## 2023-05-09 DIAGNOSIS — G510 Bell's palsy: Secondary | ICD-10-CM | POA: Diagnosis not present

## 2023-05-09 DIAGNOSIS — L259 Unspecified contact dermatitis, unspecified cause: Secondary | ICD-10-CM | POA: Diagnosis not present

## 2023-05-09 DIAGNOSIS — I509 Heart failure, unspecified: Secondary | ICD-10-CM | POA: Diagnosis not present

## 2023-05-09 DIAGNOSIS — N3281 Overactive bladder: Secondary | ICD-10-CM | POA: Diagnosis not present

## 2023-05-09 DIAGNOSIS — G40901 Epilepsy, unspecified, not intractable, with status epilepticus: Secondary | ICD-10-CM | POA: Diagnosis not present

## 2023-05-09 DIAGNOSIS — G309 Alzheimer's disease, unspecified: Secondary | ICD-10-CM | POA: Diagnosis not present

## 2023-05-09 DIAGNOSIS — D500 Iron deficiency anemia secondary to blood loss (chronic): Secondary | ICD-10-CM | POA: Diagnosis not present

## 2023-05-09 DIAGNOSIS — N1832 Chronic kidney disease, stage 3b: Secondary | ICD-10-CM | POA: Diagnosis not present

## 2023-05-09 DIAGNOSIS — H409 Unspecified glaucoma: Secondary | ICD-10-CM | POA: Diagnosis not present

## 2023-05-09 DIAGNOSIS — E1140 Type 2 diabetes mellitus with diabetic neuropathy, unspecified: Secondary | ICD-10-CM | POA: Diagnosis not present

## 2023-05-09 DIAGNOSIS — E1122 Type 2 diabetes mellitus with diabetic chronic kidney disease: Secondary | ICD-10-CM | POA: Diagnosis not present

## 2023-05-09 DIAGNOSIS — E559 Vitamin D deficiency, unspecified: Secondary | ICD-10-CM | POA: Diagnosis not present

## 2023-05-09 DIAGNOSIS — B9620 Unspecified Escherichia coli [E. coli] as the cause of diseases classified elsewhere: Secondary | ICD-10-CM | POA: Diagnosis not present

## 2023-05-09 DIAGNOSIS — Z7982 Long term (current) use of aspirin: Secondary | ICD-10-CM | POA: Diagnosis not present

## 2023-05-09 DIAGNOSIS — N390 Urinary tract infection, site not specified: Secondary | ICD-10-CM | POA: Diagnosis not present

## 2023-05-09 DIAGNOSIS — Z95828 Presence of other vascular implants and grafts: Secondary | ICD-10-CM | POA: Diagnosis not present

## 2023-05-14 ENCOUNTER — Ambulatory Visit: Payer: Medicare Other

## 2023-05-14 ENCOUNTER — Other Ambulatory Visit: Payer: Self-pay | Admitting: *Deleted

## 2023-05-14 DIAGNOSIS — Z0000 Encounter for general adult medical examination without abnormal findings: Secondary | ICD-10-CM

## 2023-05-14 DIAGNOSIS — E1121 Type 2 diabetes mellitus with diabetic nephropathy: Secondary | ICD-10-CM | POA: Diagnosis not present

## 2023-05-14 NOTE — Patient Instructions (Signed)
 [name] , Thank you for taking time to come for your Medicare Wellness Visit. I appreciate your ongoing commitment to your health goals. Please review the following plan we discussed and let me know if I can assist you in the future.   Referrals/Orders/Follow-Ups/Clinician Recommendations: You are due for a foot exam and a urine sample to evaluate kidney function during your next visit.   Remember to discuss a referral for a eye doctor during visit.    This is a list of the screening recommended for you and due dates:  Health Maintenance  Topic Date Due   DEXA scan (bone density measurement)  Never done   Yearly kidney health urinalysis for diabetes  12/12/2021   Complete foot exam   05/26/2023*   Flu Shot  08/09/2023*   Eye exam for diabetics  09/08/2023*   Hemoglobin A1C  10/12/2023   Yearly kidney function blood test for diabetes  05/02/2024   Medicare Annual Wellness Visit  05/13/2024   DTaP/Tdap/Td vaccine (3 - Tdap) 10/31/2024   Pneumonia Vaccine  Completed   HPV Vaccine  Aged Out   COVID-19 Vaccine  Discontinued   Zoster (Shingles) Vaccine  Discontinued  *Topic was postponed. The date shown is not the original due date.    Advanced directives: (Copy Requested) Please bring a copy of your health care power of attorney and living will to the office to be added to your chart at your convenience.  Next Medicare Annual Wellness Visit scheduled for next year: Yes

## 2023-05-14 NOTE — Patient Instructions (Signed)
 Visit Information  Thank you for taking time to visit with me today. Please don't hesitate to contact me if I can be of assistance to you before our next scheduled telephone appointment.  Our next appointment is by telephone on Friday, 05/21/23 at 11:00 am  Please call the care guide team at [phone] if you need to cancel or reschedule your appointment.   Following are the goals we discussed today:  Patient Goals/Self-Care Activities: Participate in Transition of Care Program/Attend TOC scheduled calls Take all medications as prescribed Attend all scheduled provider appointments Call provider office for new concerns or questions  Use assistive devices as needed to prevent falls Continue working with the home health team that is involved in your care If you believe your condition is getting worse- contact your care providers (doctors) promptly- reaching out to your doctor early when you have concerns can prevent you from having to go to the hospital Please take steps to schedule an appointment with your primary care doctor as soon as possible for a hospital follow up office visit Please go over all of your medications and medication question/ concerns with your care providers (doctor)  If you are experiencing a Mental Health or Behavioral Health Crisis or need someone to talk to, please  call the Suicide and Crisis Lifeline: 988 call the [place]  National Suicide Prevention Lifeline: 571-844-4183 or TTY: 438-044-3174 TTY 681-089-2841) to talk to a trained counselor call 1-800-273-TALK (toll free, 24 hour hotline) go to [facility] Urgent Care [address] [place] [phone]) call the [facility] Crisis Line: [phone] call 911   Patient's daughter/ caregiver verbalizes understanding of instructions and care plan provided today and agrees to view in MyChart. Active MyChart status and patient understanding of how to access instructions and care  plan via MyChart confirmed with patient.     Auri Jahnke Mckinney Curtistine Pettitt, RN, BSN, Media Planner  Transitions of Care  [facility] - [facility] [phone]: direct office
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
## 2023-05-14 NOTE — Patient Outreach (Signed)
 Care Management  Transitions of Care Program Transitions of Care Post-discharge week 2/ day # 10   05/14/2023 Name: Brandy Gonzales MRN: [id number] DOB: [birth year]  Subjective: Brandy Gonzales is a 83 y.o. year old female who is a primary care patient of [name], Karlynn GAILS, MD. The Care Management team Engaged with patient Engaged with patient's daughter/ caregiver by telephone to assess and address transitions of care needs.   Consent to Services:  Patient's daughter was given information about care management services, agreed to services, and gave verbal consent to participate.  Enrolled 05/04/23  Assessment: per caregiver/ daughter [name]: She is doing fine, not having any problems.  She sleeps a whole lot, but that's how she has done for a long time.  I am taking her to nurse wellness exam today.  I still have not scheduled a new appointment with Dr. Garald, and I haven't called the home health agency yet.  I am working today.  We've been checking her blood pressures at home, and they are fine every time we take it.  The girls check her blood sugars, they must be okay because they haven't called me with any concerns          SDOH Interventions    Flowsheet Row Telephone from 05/04/2023 in [place] [facility] DEPARTMENT ED to Hosp-Admission (Discharged) from 04/25/2023 in [facility]  SURGICAL Telephone from 04/05/2023 in [place] POPULATION HEALTH DEPARTMENT Telephone from 10/19/2022 in [facility] Coord[facility] ED to Hosp-Admission (Discharged) from 10/14/2022 in [place] [facility] Telephone from 06/15/2022 in [facility]  SDOH Interventions        Food Insecurity Interventions Intervention Not Indicated  [daughter/ caregiver HCPOA denies food insecurity today] Intervention Not Indicated, Inpatient TOC Intervention Not Indicated Intervention Not Indicated -- Intervention Not  Indicated  Housing Interventions Intervention Not Indicated -- Intervention Not Indicated Intervention Not Indicated -- --  Transportation Interventions Intervention Not Indicated  [daughter/ caregiver HCPOA provides all transportation] -- Intervention Not Indicated  [daughter/ POA provides all transportation] Intervention Not Indicated Intervention Not Indicated, Inpatient TOC, Patient Resources (Friends/Family) Intervention Not Indicated  [family provides transportation]  Utilities Interventions Intervention Not Indicated -- Intervention Not Indicated -- -- --        Goals Addressed             This Visit's Progress    TOC 30-day Program Care Plan   On track    Current Barriers:  Medication management daughter manages all aspects of medication management- not currently giving hydralazine - states patient had prior side effects when taking this medication and she is not currently going to administer- reports taking carvedilol  25 mg BID  Provider appointments has HFU appointment with covering NP at PCP office for 05/16/22: however- daughter is working that day and needs to re-schedule: attempted to facilitate re-scheduling, however, none of the options presented worked for the daughter around her work schedule- daughter states she will call PCP office herself to schedule Home Health services [facility] has been ordered to resume services post-hospital discharge on 05/03/23- they were previously active/ established in patient's care; daughter declines need for assistance in ensuring they will resume services- states she has their phone number and will call them herself Equipment/DME Needs thorough assessment of DME  RNCM Clinical Goal(s):  Patient will work with the Care Management team over the next 30 days to address Transition of Care Barriers: Medication Management  Support at home Provider appointments Home Health services Equipment/DME take all medications exactly as prescribed and will  call provider for medication related questions as evidenced by caregiver reporting during [facility] 30-day program outreaches by telephone attend all scheduled medical appointments: 05/16/22- PCP: depending on what daughter is able to get re-scheduled, around her work schedule as evidenced by review of same with caregiver during [facility] 30-day program outreaches  05/14/23: daughter again reports she is unable to complete this visit and will re-schedule; she declines my assistance in doing so; states she will schedule when she takes patient for AWE scheduled later this afternoon not experience hospital admission as evidenced by review of EMR. Hospital Admissions in last 6 months = 5  through collaboration with RN Care manager, provider, and care team.   Interventions: Evaluation of current treatment plan related to  self management and patient's adherence to plan as established by provider  Transitions of Care:  Goal on track:  Yes. 05/14/23 Doctor Visits  - discussed the importance of doctor visits Again attempted to facilitate re-scheduling of PCP HFU office visit from 05/16/22 due to caregiver's stated work conflict: daughter declines- she states she will schedule herself when she takes patient to AWE later today 05/14/23 Full medication review completed- daughter denies questions/ concerns around medications- reports she organizes medications in weekly pill box and then patient's granddaughter's whom she lives with administers medications; Full medication review with updating medication list in EHR per patient's daughter report  Daughter tells me today that she has begun intermittent blood pressure monitoring for patient at home: states they have all been good; she gives me general range of always around 150/80; she does not have specific values written down for review Confirmed that patient's granddaughters whom she lives with monitor/ record blood sugars at home-- however, daughter/ caregiver reports, I don't  know how they run; they must be okay, because nobody has called me with any concerns Full DME assessment completed with caregiver- she denies additional need for DME during today's call Role of home health services with importance of participation/ ongoing engagement Confirmed patient's caregiver/ daughter has not yet called nor heard from [facility] agency: services were supposed to be resumed at time of 05/03/23 hospital discharge; care coordination outreach completed with [facility]- spoke with [facility]; she confirms that home health order had not been released within agency files at time of discharge-- states she will do so now and will call patient's daughter to schedule resumption of home visits  Patient Goals/Self-Care Activities: Participate in Transition of Care Program/Attend TOC scheduled calls Take all medications as prescribed Attend all scheduled provider appointments Call provider office for new concerns or questions  Use assistive devices as needed to prevent falls Continue working with the home health team that is involved in your care If you believe your condition is getting worse- contact your care providers (doctors) promptly- reaching out to your doctor early when you have concerns can prevent you from having to go to the hospital Please take steps to schedule an appointment with your primary care doctor as soon as possible for a hospital follow up office visit Please go over all of your medications and medication question/ concerns with your care providers (doctor)  Follow Up Plan:  Telephone follow up appointment with care management team member scheduled for:  Friday 05/21/23 at 11:00 am          Plan: Telephone follow up appointment with care management team member scheduled for:  Friday 05/21/23  at 11:00 am  Samyuktha Brau Mckinney Mitra Duling, RN, BSN, Media Planner  Transitions of Care  [facility] - [facility] [phone]: direct  office

## 2023-05-17 ENCOUNTER — Inpatient Hospital Stay: Payer: Medicare Other | Admitting: Family Medicine

## 2023-05-19 ENCOUNTER — Telehealth: Payer: Self-pay

## 2023-05-19 DIAGNOSIS — G40901 Epilepsy, unspecified, not intractable, with status epilepticus: Secondary | ICD-10-CM | POA: Diagnosis not present

## 2023-05-19 DIAGNOSIS — E1165 Type 2 diabetes mellitus with hyperglycemia: Secondary | ICD-10-CM | POA: Diagnosis not present

## 2023-05-19 DIAGNOSIS — I130 Hypertensive heart and chronic kidney disease with heart failure and stage 1 through stage 4 chronic kidney disease, or unspecified chronic kidney disease: Secondary | ICD-10-CM | POA: Diagnosis not present

## 2023-05-19 DIAGNOSIS — N390 Urinary tract infection, site not specified: Secondary | ICD-10-CM | POA: Diagnosis not present

## 2023-05-19 DIAGNOSIS — B9620 Unspecified Escherichia coli [E. coli] as the cause of diseases classified elsewhere: Secondary | ICD-10-CM | POA: Diagnosis not present

## 2023-05-19 DIAGNOSIS — G9341 Metabolic encephalopathy: Secondary | ICD-10-CM | POA: Diagnosis not present

## 2023-05-19 NOTE — Telephone Encounter (Signed)
 Copied from CRM [id number]. Topic: Clinical - Medical Advice >> May 19, 2023  2:46 PM Mercedes MATSU wrote:  Reason for CRM: Home health called and wants to know if the patients medications has changed, or if they need to do anything else with her besides the physical therapy. Reena Roys from Home health is asking to be called back @ [phone].

## 2023-05-21 ENCOUNTER — Other Ambulatory Visit: Payer: Self-pay | Admitting: *Deleted

## 2023-05-21 NOTE — Patient Instructions (Signed)
 Visit Information  Thank you for taking time to visit with me today. Please don't hesitate to contact me if I can be of assistance to you before our next scheduled telephone appointment.  Our next appointment is by telephone on Friday, 05/28/23 at 11:00 am  Please call the care guide team at [phone] if you need to cancel or reschedule your appointment.   Following are the goals we discussed today:  Patient Goals/Self-Care Activities: Participate in Transition of Care Program/Attend TOC scheduled calls Take all medications as prescribed Attend all scheduled provider appointments Call provider office for new concerns or questions  Use assistive devices as needed to prevent falls Continue working with the home health team that is involved in your care If you believe your condition is getting worse- contact your care providers (doctors) promptly- reaching out to your doctor early when you have concerns can prevent you from having to go to the hospital Please go over all of your medications and medication question/ concerns with your care providers (doctor)  If you are experiencing a Mental Health or Behavioral Health Crisis or need someone to talk to, please  call the Suicide and Crisis Lifeline: 988 call the [place]  National Suicide Prevention Lifeline: 838-691-8491 or TTY: 507-692-2242 TTY 306-190-4200) to talk to a trained counselor call 1-800-273-TALK (toll free, 24 hour hotline) go to [facility] [address], [place] [phone]) call the [facility] Crisis Line: (539)139-1374 call 911   Patient verbalizes understanding of instructions and care plan provided today and agrees to view in MyChart. Active MyChart status and patient understanding of how to access instructions and care plan via MyChart confirmed with patient.     Raneen Jaffer Mckinney Coltyn Hanning, RN, BSN, Media Planner  Transitions of Care  [facility] - [facility] [phone]: direct office

## 2023-05-21 NOTE — Patient Outreach (Signed)
 Care Management  Transitions of Care Program Transitions of Care Post-discharge week 3/ day # 17   05/21/2023 Name: Brandy Gonzales MRN: [id number] DOB: 16-Feb-1941  Subjective: Brandy Gonzales is a 83 y.o. year old female who is a primary care patient of [name], Karlynn GAILS, MD. The Care Management team Engaged with patient Engaged with patient's daughter/ caregiver by telephone to assess and address transitions of care needs.   Consent to Services:  Patient was given information about care management services, agreed to services, and gave verbal consent to participate.   Enrolled 05/04/23  Assessment: per caregiver/ daughter:  She is doing fine- really well, actually.  Blood pressures are running fine at home and when we took her to the [facility] last week.  She gets mad when we take her outside of the home to doctor appointments, but she gets over it as soon as we get her back home and settled.  Her granddaughters who live with her continue to provide her medications to her after I put them in the pill box; they prepare all of her meals and keep her depends cleaned up- she is incontinent.  Caregiver/ daughter denies specific clinical concerns today           SDOH Interventions    Flowsheet Row Clinical Support from 05/14/2023 in [facility] Telephone from 05/04/2023 in [place] POPULATION HEALTH DEPARTMENT ED to Hosp-Admission (Discharged) from 04/25/2023 in [facility]URGICAL Telephone from 04/05/2023 in [place] POPULATION HEALTH DEPARTMENT Telephone from 10/19/2022 in [facility] ED to Hosp-Admission (Discharged) from 10/14/2022 in [facility]  SDOH Interventions        Food Insecurity Interventions Intervention Not Indicated Intervention Not Indicated  [daughter/ caregiver HCPOA denies food insecurity today] Intervention Not Indicated, Inpatient TOC Intervention Not  Indicated Intervention Not Indicated --  Housing Interventions Intervention Not Indicated Intervention Not Indicated -- Intervention Not Indicated Intervention Not Indicated --  Transportation Interventions Intervention Not Indicated Intervention Not Indicated  [daughter/ caregiver HCPOA provides all transportation] -- Intervention Not Indicated  [daughter/ POA provides all transportation] Intervention Not Indicated Intervention Not Indicated, Inpatient TOC, Patient Resources (Friends/Family)  Utilities Interventions Intervention Not Indicated Intervention Not Indicated -- Intervention Not Indicated -- --  Alcohol Usage Interventions Intervention Not Indicated (Score <7) -- -- -- -- --  Financial Strain Interventions Intervention Not Indicated -- -- -- -- --  Physical Activity Interventions Intervention Not Indicated -- -- -- -- --  Stress Interventions Intervention Not Indicated -- -- -- -- --  Social Connections Interventions Intervention Not Indicated -- -- -- -- --  Health Literacy Interventions Intervention Not Indicated -- -- -- -- --        Goals Addressed             This Visit's Progress    TOC 30-day Program Care Plan   On track    Current Barriers:  Medication management daughter manages all aspects of medication management- not currently giving hydralazine - states patient had prior side effects when taking this medication and she is not currently going to administer- reports taking carvedilol  25 mg BID  Provider appointments has HFU appointment with covering NP at PCP office for 05/16/22: however- daughter is working that day and needs to re-schedule: attempted to facilitate re-scheduling, however, none of the options presented worked for the daughter around her work schedule- daughter states she will call PCP office  herself to schedule Home Health services [facility] has been ordered to resume services post-hospital discharge on 05/03/23- they were previously active/ established in  patient's care; daughter declines need for assistance in ensuring they will resume services- states she has their phone number and will call them herself Equipment/DME Needs thorough assessment of DME  RNCM Clinical Goal(s):  Patient will work with the Care Management team over the next 30 days to address Transition of Care Barriers: Medication Management Support at home Provider appointments Home Health services Equipment/DME take all medications exactly as prescribed and will call provider for medication related questions as evidenced by caregiver reporting during [facility] 30-day program outreaches by telephone attend all scheduled medical appointments: 05/16/22- PCP: depending on what daughter is able to get re-scheduled, around her work schedule as evidenced by review of same with caregiver during [facility] 30-day program outreaches  05/14/23: daughter again reports she is unable to complete this visit and will re-schedule; she declines my assistance in doing so; states she will schedule when she takes patient for [facility] scheduled later this afternoon not experience hospital admission as evidenced by review of EMR. Hospital Admissions in last 6 months = 5  through collaboration with RN Care manager, provider, and care team.   Interventions: Evaluation of current treatment plan related to  self management and patient's adherence to plan as established by provider  Transitions of Care:  Goal on track:  Yes. 05/21/23 Doctor Visits  - discussed the importance of doctor visits Confirmed caregiver transported/ attended with patient [facility] as scheduled on 05/14/23- she denies questions/ concerns around visit; states patient tolerated being transported to visit okay; stated she gets mad every time we put her in a car, but she gets over it as soon we get her back home and settled; she denied issues around getting patient to/ from office visit Confirmed caregiver now has scheduled PCP office visit: Wednesday 05/26/23  at 3:00 pm: verbalizes plans to transport/ attend with patient as scheduled Discussed possible resources for home repair needs: caregiver mentioned that it is difficult getting patient on/ off porch to attend provider appointments; her porch could use some repairs; states she has obtained estimates to have this work done, but they are expensive- she is unable to discuss today- but is willing to discuss at time of next outreach- I will reach out to leadership for guidance around getting caregiver information re: possible resources for ongoing discussion at our next scheduled outreach Confirmed with caregiver, no questions/ concerns around medications: she continues managing patient's medications; granddaughters that reside with patient provide medication daily when it is time Reviewed blood pressure monitoring at home: caregiver states they have all been good; she does not have specific values to share today- caregiver is at work and does not have list of home values Re- confirmed that patient's granddaughters whom she lives with monitor/ record blood sugars at home-- however, does not have specific values to share today- caregiver is at work and does not have list of home values Confirmed caregiver has communicated with home health services and they are currently active ([facility]) after care coordination outreach which was placed on 05/14/23- reports going fine; making regular visits Discussed/ provided education re: basic skin care with need for regular/ daily skin assessment, given caregiver report that patient is incontinent of both urine and stool: she reports she will share information with patient's granddaughters, who manage toileting needs and change her depends several times each day; discussed possible complications of skin breakdown and strategies  to prevent skin breakdown-- daughter will follow up with patient's granddaughters  to ensure no skin breakdown and to share education provided  today    Patient Goals/Self-Care Activities: Participate in Transition of Care Program/Attend [facility] scheduled calls Take all medications as prescribed Attend all scheduled provider appointments Call provider office for new concerns or questions  Use assistive devices as needed to prevent falls Continue working with the home health team that is involved in your care If you believe your condition is getting worse- contact your care providers (doctors) promptly- reaching out to your doctor early when you have concerns can prevent you from having to go to the hospital Please go over all of your medications and medication question/ concerns with your care providers (doctor)  Follow Up Plan:  Telephone follow up appointment with care management team member scheduled for:  Friday 05/28/23 at 11:00 am          Plan: Telephone follow up appointment with care management team member scheduled for:  Friday 05/28/23 at 11:00 am   Camelle Henkels Mckinney Sarabi Sockwell, RN, BSN, Media Planner  Transitions of Care  [facility] - [facility] [phone]: direct office

## 2023-05-23 NOTE — Telephone Encounter (Signed)
 What do I need to say?  Thank you

## 2023-05-25 NOTE — Telephone Encounter (Signed)
 Pt has an appointment with her provider to discuss medications.

## 2023-05-26 ENCOUNTER — Ambulatory Visit: Payer: Medicare Other | Admitting: Internal Medicine

## 2023-05-26 ENCOUNTER — Encounter: Payer: Self-pay | Admitting: Internal Medicine

## 2023-05-26 VITALS — BP 98/60 | HR 60 | Temp 98.0°F | Ht 59.0 in | Wt 107.0 lb

## 2023-05-26 DIAGNOSIS — I10 Essential (primary) hypertension: Secondary | ICD-10-CM

## 2023-05-26 DIAGNOSIS — Z95828 Presence of other vascular implants and grafts: Secondary | ICD-10-CM | POA: Diagnosis not present

## 2023-05-26 DIAGNOSIS — G301 Alzheimer's disease with late onset: Secondary | ICD-10-CM | POA: Diagnosis not present

## 2023-05-26 DIAGNOSIS — E1165 Type 2 diabetes mellitus with hyperglycemia: Secondary | ICD-10-CM | POA: Diagnosis not present

## 2023-05-26 DIAGNOSIS — N1832 Chronic kidney disease, stage 3b: Secondary | ICD-10-CM | POA: Diagnosis not present

## 2023-05-26 DIAGNOSIS — F02B18 Dementia in other diseases classified elsewhere, moderate, with other behavioral disturbance: Secondary | ICD-10-CM | POA: Diagnosis not present

## 2023-05-26 DIAGNOSIS — G40909 Epilepsy, unspecified, not intractable, without status epilepticus: Secondary | ICD-10-CM

## 2023-05-26 DIAGNOSIS — E1122 Type 2 diabetes mellitus with diabetic chronic kidney disease: Secondary | ICD-10-CM

## 2023-05-26 MED ORDER — LEVETIRACETAM 750 MG PO TABS: 750.0000 mg | ORAL_TABLET | Freq: Two times a day (BID) | ORAL | 1 refills | Status: DC

## 2023-05-26 NOTE — Assessment & Plan Note (Signed)
 Low BP. Reduce Coreg  - 12.5 mg bid

## 2023-05-26 NOTE — Progress Notes (Signed)
 Subjective:  Patient ID: Brandy Gonzales, female    DOB: [birth year]  Age: 83 y.o. MRN: [id number]  CC: Hospitalization Follow-up   HPI Brandy Gonzales presents for a post hospital f/u - HTN, ?PE?DVT ruled out, seizures  Per hx: " Admit date:     04/30/2023  Discharge date: 05/03/2023  Discharge Physician: Aneita Keens, MD    PCP: Genia Kettering, MD    Recommendations at discharge:  PCP visit for hospital follow-up   Discharge Diagnoses: Principal Problem:   Chest pain Active Problems:   Hypertensive urgency   Elevated d-dimer   History of pyelonephritis   Controlled type 2 diabetes mellitus without complication, without long-term current use of insulin  (HCC)   Stage 3b chronic kidney disease (CKD) (HCC)   Normocytic anemia   Seizure disorder (HCC)   Debility   History of DVT (deep vein thrombosis)   GERD (gastroesophageal reflux disease)   Hypertensive emergency   Resolved Problems:   * No resolved hospital problems. [facility] Course: Brandy Gonzales is a 83 y.o. female with a history of hypertension, diabetes mellitus type 2, left lower extremity DVT, CKD stage IIIb, dementia, seizures.  Patient presented secondary to chest pain versus left-sided abdominal swelling.  Workup for chest pain was unremarkable for etiology with resolution of symptoms.  While admitted, patient's blood pressure was actively managed with adjustment of antihypertensive regimen.     Assessment and Plan:   Chest pain Associated mildly elevated troponin with peak of 42. Chest x-ray without evidence of cardiopulmonary disease. EKG significant for non-specific t-wave changes in anterolateral leads that are unchanged from prior EKG. Transthoracic Echocardiogram ordered and significant for an LVEF of 60-65% with no regional wall motion abnormalities. D-dimer elevated but workup for DVT/PE was negative. Chest pain resolved prior to discharge. Possibly related to hypertension or possibly  musculoskeletal vs GI in etiology.   Elevated d-dimer Bilateral lower extremity venous duplex and V/Q scan negative for evidence of DVT/PT.   Severe asymptomatic hypertension Medication regimen adjusted. Patient's hydralazine  increased to 50 mg TID. Continue Coreg .   History of pyelonephritis Continue Cefadroxil .   CKD stage IIIb Stable.   Normocytic anemia Transient.   Seizure disorder Continue Keppra    GERD Continue Prilosec.     Consultants: None Procedures performed: Transthoracic Echocardiogram  Disposition: Home health Diet recommendation: Cardiac and Carb modified diet"      Outpatient Medications Prior to Visit  Medication Sig Dispense Refill   acetaminophen  (TYLENOL ) 500 MG tablet Take 500-1,000 mg by mouth every 6 (six) hours as needed for moderate pain (pain score 4-6).     aspirin  EC 81 MG tablet Take 81 mg by mouth daily. Swallow whole.     carvedilol  (COREG ) 25 MG tablet TAKE 1 TABLET(25 MG) BY MOUTH TWICE DAILY WITH A MEAL (Patient taking differently: Take 25 mg by mouth daily.) 180 tablet 1   glimepiride  (AMARYL ) 2 MG tablet Take 0.5 tablets (1 mg total) by mouth daily before breakfast. 90 tablet 1   Glucagon  (GVOKE HYPOPEN  1-PACK) 1 MG/0.2ML SOAJ Inject 0.2 mLs into the skin daily as needed. 0.4 mL 3   levETIRAcetam  (KEPPRA ) 750 MG tablet Take 1 tablet (750 mg total) by mouth 2 (two) times daily. 60 tablet 1   omeprazole  (PRILOSEC) 20 MG capsule TAKE 1 CAPSULE(20 MG) BY MOUTH DAILY 90 capsule 3   hydrALAZINE  (APRESOLINE ) 50 MG tablet Take 1 tablet (50 mg total) by mouth 3 (three) times daily. 90 tablet  0   polyethylene glycol powder (GLYCOLAX /MIRALAX ) 17 GM/SCOOP powder Take 1 capful (17 g) with water by mouth daily. (Patient not taking: Reported on 05/14/2023) 238 g 0   senna-docusate (SENOKOT-S) 8.6-50 MG tablet Take 1 tablet by mouth 2 (two) times daily. (Patient not taking: Reported on 05/14/2023) 60 tablet 0   No facility-administered medications prior to  visit.    ROS: Review of Systems  Constitutional:  Positive for fatigue. Negative for activity change, appetite change, chills and unexpected weight change.  HENT:  Negative for congestion, mouth sores and sinus pressure.   Eyes:  Negative for visual disturbance.  Respiratory:  Negative for cough and chest tightness.   Gastrointestinal:  Negative for abdominal pain and nausea.  Genitourinary:  Negative for difficulty urinating, frequency and vaginal pain.  Musculoskeletal:  Negative for back pain and gait problem.  Skin:  Negative for pallor and rash.  Neurological:  Positive for weakness. Negative for dizziness, tremors, numbness and headaches.  Hematological:  Does not bruise/bleed easily.  Psychiatric/Behavioral:  Positive for decreased concentration. Negative for confusion, sleep disturbance and suicidal ideas. The patient is not nervous/anxious.     Objective:  BP 98/60 (BP Location: Right Arm, Patient Position: Sitting, Cuff Size: Normal)   Pulse 60   Temp 98 F (36.7 C) (Oral)   Ht 4\' 11"  (1.499 m)   Wt 107 lb (48.5 kg)   SpO2 98%   BMI 21.61 kg/m   BP Readings from Last 3 Encounters:  05/26/23 98/60  05/03/23 (!) 146/69  04/28/23 (!) 152/79    Wt Readings from Last 3 Encounters:  05/26/23 107 lb (48.5 kg)  05/03/23 107 lb 12.9 oz (48.9 kg)  04/28/23 107 lb 12.9 oz (48.9 kg)    Physical Exam Constitutional:      General: She is not in acute distress.    Appearance: She is well-developed.  HENT:     Head: Normocephalic.     Right Ear: External ear normal.     Left Ear: External ear normal.     Nose: Nose normal.  Eyes:     General:        Right eye: No discharge.        Left eye: No discharge.     Conjunctiva/sclera: Conjunctivae normal.     Pupils: Pupils are equal, round, and reactive to light.  Neck:     Thyroid : No thyromegaly.     Vascular: No JVD.     Trachea: No tracheal deviation.  Cardiovascular:     Rate and Rhythm: Normal rate and regular  rhythm.     Heart sounds: Normal heart sounds.  Pulmonary:     Effort: No respiratory distress.     Breath sounds: No stridor. No wheezing.  Abdominal:     General: Bowel sounds are normal. There is no distension.     Palpations: Abdomen is soft. There is no mass.     Tenderness: There is no abdominal tenderness. There is no guarding or rebound.  Musculoskeletal:        General: No tenderness.     Cervical back: Normal range of motion and neck supple. No rigidity.  Lymphadenopathy:     Cervical: No cervical adenopathy.  Skin:    Findings: No erythema or rash.  Neurological:     Mental Status: Mental status is at baseline.     Cranial Nerves: No cranial nerve deficit.     Motor: No abnormal muscle tone.     Coordination: Coordination  normal.     Deep Tendon Reflexes: Reflexes normal.  Psychiatric:        Thought Content: Thought content normal.   Sleepy in a w/c  Lab Results  Component Value Date   WBC 6.8 05/03/2023   HGB 12.1 05/03/2023   HCT 36.0 05/03/2023   PLT 201 05/03/2023   GLUCOSE 251 (H) 05/03/2023   CHOL 140 10/21/2021   TRIG 157.0 (H) 10/21/2021   HDL 37.00 (L) 10/21/2021   LDLDIRECT 78.9 10/22/2008   LDLCALC 72 10/21/2021   ALT 34 04/26/2023   AST 32 04/26/2023   NA 138 05/03/2023   K 4.4 05/03/2023   CL 105 05/03/2023   CREATININE 1.27 (H) 05/03/2023   BUN 22 05/03/2023   CO2 26 05/03/2023   TSH 4.03 04/13/2023   INR 1.1 04/01/2023   HGBA1C 5.8 04/13/2023   MICROALBUR <0.7 12/12/2020    VAS US  LOWER EXTREMITY VENOUS (DVT) Result Date: 05/02/2023  Lower Venous DVT Study Patient Name:  Brandy Gonzales  Date of Exam:   05/01/2023 Medical Rec #: [id number]     Accession #:    [id number] Date of Birth: Dec 03, 1940     Patient Gender: F Patient Age:   51 years Exam Location:  [facility] Procedure:      VAS US  LOWER EXTREMITY VENOUS (DVT) Referring Phys: Manny Sees --------------------------------------------------------------------------------   Indications: Swelling, and chest pain.  Risk Factors: DVT DVT 05/04/22 left CFV, FV, Profunda vein, popliteal vein. Comparison Study: Prior negative left LEV done 08/07/22. Performing Technologist: Carleene Chase RVS  Examination Guidelines: A complete evaluation includes B-mode imaging, spectral Doppler, color Doppler, and power Doppler as needed of all accessible portions of each vessel. Bilateral testing is considered an integral part of a complete examination. Limited examinations for reoccurring indications may be performed as noted. The reflux portion of the exam is performed with the patient in reverse Trendelenburg.  +---------+---------------+---------+-----------+---------------+-------------+ RIGHT    CompressibilityPhasicitySpontaneityProperties     Thrombus                                                                 Aging         +---------+---------------+---------+-----------+---------------+-------------+ CFV      Full           Yes      No         pulsatile                                                                waveform                     +---------+---------------+---------+-----------+---------------+-------------+ SFJ      Full                                                            +---------+---------------+---------+-----------+---------------+-------------+ FV  Prox  Full                                                            +---------+---------------+---------+-----------+---------------+-------------+ FV Mid   Full                                                            +---------+---------------+---------+-----------+---------------+-------------+ FV DistalFull                                                            +---------+---------------+---------+-----------+---------------+-------------+ PFV      Full                                                             +---------+---------------+---------+-----------+---------------+-------------+ POP      Full           Yes      No         pulsatile                                                                waveform                     +---------+---------------+---------+-----------+---------------+-------------+ PTV      Full                                                            +---------+---------------+---------+-----------+---------------+-------------+ PERO     Full                                                            +---------+---------------+---------+-----------+---------------+-------------+   +---------+---------------+---------+-----------+----------+-------------------+ LEFT     CompressibilityPhasicitySpontaneityPropertiesThrombus Aging      +---------+---------------+---------+-----------+----------+-------------------+ CFV      Full           Yes      Yes                                      +---------+---------------+---------+-----------+----------+-------------------+ SFJ      Full                                                             +---------+---------------+---------+-----------+----------+-------------------+  FV Prox  Full                                                             +---------+---------------+---------+-----------+----------+-------------------+ FV Mid   Full                                                             +---------+---------------+---------+-----------+----------+-------------------+ FV DistalFull           Yes      Yes                                      +---------+---------------+---------+-----------+----------+-------------------+ PFV      Full                                                             +---------+---------------+---------+-----------+----------+-------------------+ POP      Full           Yes      Yes                                       +---------+---------------+---------+-----------+----------+-------------------+ PTV                                                   Not well visualized +---------+---------------+---------+-----------+----------+-------------------+ PERO     Full                                                             +---------+---------------+---------+-----------+----------+-------------------+     Summary: BILATERAL: - No evidence of deep vein thrombosis seen in the lower extremities, bilaterally. -No evidence of popliteal cyst, bilaterally.   *See table(s) above for measurements and observations. Electronically signed by Genny Kid MD on 05/02/2023 at 12:12:45 AM.    Final    CT ABDOMEN PELVIS WO CONTRAST Result Date: 04/30/2023 CLINICAL DATA:  Abdominal pain.  Concern for kidney stone. EXAM: CT ABDOMEN AND PELVIS WITHOUT CONTRAST TECHNIQUE: Multidetector CT imaging of the abdomen and pelvis was performed following the standard protocol without IV contrast. RADIATION DOSE REDUCTION: This exam was performed according to the departmental dose-optimization program which includes automated exposure control, adjustment of the mA and/or kV according to patient size and/or use of iterative reconstruction technique. COMPARISON:  CT dated 04/25/2023. FINDINGS: Evaluation of this exam is limited in the absence of intravenous contrast as well as due to streak artifact caused by patient's arms. Lower  chest: Small bilateral pleural effusions and bibasilar compressive atelectasis or infiltrate. There is coronary vascular calcification. No intra-abdominal free air or free fluid. Hepatobiliary: The liver is unremarkable. There is mild biliary ductal dilatation versus periportal edema. Layering sludge and/or small stones in the gallbladder. No pericholecystic fluid. Pancreas: Unremarkable. No pancreatic ductal dilatation or surrounding inflammatory changes. Spleen: Normal in size without focal abnormality.  Adrenals/Urinary Tract: Mild bilateral adrenal thickening/hyperplasia. There is mild bilateral renal parenchyma atrophy. There is no hydronephrosis or nephrolithiasis on either side. There is a 1 cm ill-defined high attenuating lesion in the interpolar left kidney which is not characterized but may represent a complex cyst. Ultrasound may provide better evaluation if clinically indicated. The visualized ureters and urinary bladder appear unremarkable. Stomach/Bowel: There is sigmoid diverticulosis without active inflammatory changes. There is moderate stool throughout the colon. There is no bowel obstruction or active inflammation. The appendix is normal. Vascular/Lymphatic: Mild aortoiliac atherosclerotic disease. The IVC is unremarkable. An infrarenal IVC filter noted. No portal venous gas. There is no adenopathy. Reproductive: Hysterectomy.  No suspicious adnexal masses. Other: Mild diffuse subcutaneous edema.  No fluid collection. Musculoskeletal: Osteopenia with degenerative changes of the spine. Left femoral neck ORIF. Avascular necrosis of the right femoral head. No acute osseous pathology. IMPRESSION: 1. No acute intra-abdominal or pelvic pathology. No hydronephrosis or nephrolithiasis. 2. Sigmoid diverticulosis. No bowel obstruction. Normal appendix. 3. Small bilateral pleural effusions and bibasilar compressive atelectasis or infiltrate. 4.  Aortic Atherosclerosis (ICD10-I70.0). Electronically Signed   By: Angus Bark M.D.   On: 04/30/2023 18:51   DG Chest Port 1 View Result Date: 04/30/2023 CLINICAL DATA:  Chest pain, recent urinary tract infection EXAM: PORTABLE CHEST 1 VIEW COMPARISON:  04/25/2023 FINDINGS: Single frontal view of the chest demonstrates a stable cardiac silhouette. No airspace disease, effusion, or pneumothorax. No acute bony abnormalities. IMPRESSION: 1. No acute intrathoracic process. Electronically Signed   By: Bobbye Burrow M.D.   On: 04/30/2023 09:31    Assessment &  Plan:   Problem List Items Addressed This Visit     RESOLVED: Uncontrolled type 2 diabetes mellitus with hyperglycemia, without long-term current use of insulin  (HCC) (Chronic)   Cont on Glimepiride  1 mg - nl CBGs Check A1c q 3 months Use protein drinks daily GVOKE pen prn      HTN (hypertension) - Primary   Low BP. Reduce Coreg  - 12.5 mg bid      History of insertion of IVC (inferior vena caval) filter 2006   No recent PE/DVT - ruled out      Type 2 diabetes mellitus with diabetic chronic kidney disease (HCC)   Monitoring GFR      Alzheimer's dementia (HCC)   Worse. Sleepy on the seizure medicine.  Discussed with daughter Neurol appt is in Feb 2025      Seizure disorder [facility])   Sleepy on the seizure medicine.  Discussed with daughter Neurol appt is in Feb 2025 Dr Samara Crest On Keppra  750 bid         No orders of the defined types were placed in this encounter.     Follow-up: No follow-ups on file.  Anitra Barn, MD

## 2023-05-26 NOTE — Assessment & Plan Note (Signed)
 No recent PE/DVT - ruled out

## 2023-05-26 NOTE — Assessment & Plan Note (Signed)
 Monitoring GFR

## 2023-05-26 NOTE — Assessment & Plan Note (Signed)
 Worse. Sleepy on the seizure medicine.  Discussed with daughter Neurol appt is in Feb 2025

## 2023-05-26 NOTE — Assessment & Plan Note (Signed)
 Cont on Glimepiride  1 mg - nl CBGs Check A1c q 3 months Use protein drinks daily GVOKE pen prn

## 2023-05-26 NOTE — Assessment & Plan Note (Addendum)
 Sleepy on the seizure medicine.  Discussed with daughter Neurol appt is in Feb 2025 Dr Samara Crest On Keppra  750 bid

## 2023-05-28 ENCOUNTER — Telehealth: Payer: Self-pay | Admitting: Internal Medicine

## 2023-05-28 ENCOUNTER — Other Ambulatory Visit: Payer: Self-pay | Admitting: *Deleted

## 2023-05-28 DIAGNOSIS — I130 Hypertensive heart and chronic kidney disease with heart failure and stage 1 through stage 4 chronic kidney disease, or unspecified chronic kidney disease: Secondary | ICD-10-CM | POA: Diagnosis not present

## 2023-05-28 DIAGNOSIS — N390 Urinary tract infection, site not specified: Secondary | ICD-10-CM | POA: Diagnosis not present

## 2023-05-28 DIAGNOSIS — G40901 Epilepsy, unspecified, not intractable, with status epilepticus: Secondary | ICD-10-CM | POA: Diagnosis not present

## 2023-05-28 DIAGNOSIS — B9620 Unspecified Escherichia coli [E. coli] as the cause of diseases classified elsewhere: Secondary | ICD-10-CM | POA: Diagnosis not present

## 2023-05-28 DIAGNOSIS — E1165 Type 2 diabetes mellitus with hyperglycemia: Secondary | ICD-10-CM | POA: Diagnosis not present

## 2023-05-28 DIAGNOSIS — G9341 Metabolic encephalopathy: Secondary | ICD-10-CM | POA: Diagnosis not present

## 2023-05-28 NOTE — Patient Instructions (Signed)
Visit Information  Thank you for taking time to visit with me today. Please don't hesitate to contact me if I can be of assistance to you before our next scheduled telephone appointment.  Our next appointment is by telephone on Friday, 06/04/23 at 11:00 am  Please call the care guide team at [phone] if you need to cancel or reschedule your appointment.  Following are the goals we discussed today:  Patient Goals/Self-Care Activities: Participate in Transition of Care Program/Attend TOC scheduled calls Take all medications as prescribed Attend all scheduled provider appointments Call provider office for new concerns or questions  Use assistive devices as needed to prevent falls Continue working with the home health team that is involved in your care If you believe your condition is getting worse- contact your care providers (doctors) promptly- reaching out to your doctor early when you have concerns can prevent you from having to go to the hospital Please go over all of your medications and medication question/ concerns with your care providers (doctor) Please continue to monitor patient's skin daily; keep her skin as clean and dry as possible; change her Depends diapers regularly   If you are experiencing a Mental Health or Behavioral Health Crisis or need someone to talk to, please call the Suicide and Crisis Lifeline: 988 call the [place] National Suicide Prevention Lifeline: (917)303-9161 or TTY: (620)450-6429 TTY 843-443-1688) to talk to a trained counselor call 1-800-273-TALK (toll free, 24 hour hotline) go to [facility] [address] [place] [phone]) call the [facility] Crisis Line: 713-737-6473 call 911   Patient's caregiver/ daughter verbalizes understanding of instructions and care plan provided today and agrees to view in MyChart. Active MyChart status and caregiver understanding of how to access instructions and care plan  via MyChart confirmed with patient.     Caryl Pina, RN, BSN, Media planner  Transitions of Care  [facility] [phone]: direct office

## 2023-05-28 NOTE — Patient Outreach (Signed)
Care Management  Transitions of Care Program Transitions of Care Post-discharge week 4/ day # 24   05/28/2023 Name: Brandy Gonzales MRN: [id number] DOB: 09/21/1940  Subjective: Brandy Gonzales is a 83 y.o. year old female who is a primary care patient of [name], [name], MD. The Care Management team Engaged with patient Engaged with patient's daughter/ caregiver Brandy Gonzales by telephone to assess and address transitions of care needs.   Consent to Services:  Patient's daughter/ caregiver was given information about care management services, agreed to services, and gave verbal consent to participate.  Enrolled 05/04/23  Assessment: Per caregiver/ daughter Brandy Gonzales: " The doctor visit went great; we got a good report, and there were no changes to her medications; Dr. Posey Rea agreed with my decision to not give her the hydralazine; her blood pressures have been fine, and he doesn't want it to drop too low.  He also told us we could just start checking her blood sugars at home twice a week, so that is what we are doing.  The girls are looking at her skin daily and they tell me that her skin breakdown is healing well-- getting better every week.  They are taking good care of her skin.  I don't need the resource for the porch repair anymore-- I just hired someone to replace her porch and the work starts next week;"    Daughter denies specific clinical concerns today and reports patient is "doing great"          SDOH Interventions    Flowsheet Row Patient Outreach from 05/28/2023 in [facility] POPULATION HEALTH DEPARTMENT Clinical Support from 05/14/2023 in [facility] [place] Telephone from 05/04/2023 in [place] POPULATION HEALTH DEPARTMENT ED to Hosp-Admission (Discharged) from 04/25/2023 in [facility] 6 NORTH  SURGICAL Telephone from 04/05/2023 in [place] POPULATION HEALTH DEPARTMENT Telephone from 10/19/2022 in [facility] Community Care  Coordination  SDOH Interventions        Food Insecurity Interventions Intervention Not Indicated  [Today, caregiver/ daughter Brandy Gonzales denies food insecurity] Intervention Not Indicated Intervention Not Indicated  [daughter/ caregiver HCPOA denies food insecurity today] Intervention Not Indicated, Inpatient TOC Intervention Not Indicated Intervention Not Indicated  Housing Interventions -- Intervention Not Indicated Intervention Not Indicated -- Intervention Not Indicated Intervention Not Indicated  Transportation Interventions -- Intervention Not Indicated Intervention Not Indicated  [daughter/ caregiver HCPOA provides all transportation] -- Intervention Not Indicated  [daughter/ POA provides all transportation] Intervention Not Indicated  Utilities Interventions -- Intervention Not Indicated Intervention Not Indicated -- Intervention Not Indicated --  Alcohol Usage Interventions -- Intervention Not Indicated (Score <7) -- -- -- --  Financial Strain Interventions -- Intervention Not Indicated -- -- -- --  Physical Activity Interventions -- Intervention Not Indicated -- -- -- --  Stress Interventions -- Intervention Not Indicated -- -- -- --  Social Connections Interventions -- Intervention Not Indicated -- -- -- --  Health Literacy Interventions -- Intervention Not Indicated -- -- -- --       Goals Addressed             This Visit's Progress    TOC 30-day Program Care Plan   On track    Current Barriers:  Medication management daughter manages all aspects of medication management- not currently giving hydralazine- states patient had prior side effects when taking this medication and she is not currently going to administer- reports taking carvedilol 25 mg BID  Provider appointments has HFU  appointment with covering NP at PCP office for 05/16/22: however- daughter is working that day and needs to re-schedule: attempted to facilitate re-scheduling, however, none of the options presented worked for  the daughter around her work schedule- daughter states she will call PCP office herself to schedule Home Health services [facility] has been ordered to resume services post-hospital discharge on 05/03/23- they were previously active/ established in patient's care; daughter declines need for assistance in ensuring they will resume services- states she has their phone number and will call them herself Equipment/DME Needs thorough assessment of DME  RNCM Clinical Goal(s):  Patient will work with the Care Management team over the next 30 days to address Transition of Care Barriers: Medication Management Support at home Provider appointments Home Health services Equipment/DME take all medications exactly as prescribed and will call provider for medication related questions as evidenced by caregiver reporting during [facility] 30-day program outreaches by telephone attend all scheduled medical appointments: 05/16/22- PCP: depending on what daughter is able to get re-scheduled, around her work schedule as evidenced by review of same with caregiver during [facility] 30-day program outreaches  05/14/23: daughter again reports she is unable to complete this visit and will re-schedule; she declines my assistance in doing so; states she will schedule when she takes patient for AWE scheduled later this afternoon not experience hospital admission as evidenced by review of EMR. Hospital Admissions in last 6 months = 5  through collaboration with RN Care manager, provider, and care team.   Interventions: Evaluation of current treatment plan related to  self management and patient's adherence to plan as established by provider  Transitions of Care:  Goal on track:  Yes. 05/28/23 Doctor Visits  - discussed the importance of doctor visits Confirmed caregiver transported/ attended with patient scheduled PCP office visit 05/26/23: reviewed visit with caregiver who reports "went great; we got a good report, and there were no changes to  her medications; Dr. Posey Rea agreed with my decision to not give her the hydralazine; her blood pressures have been fine, and he doesn't want it to drop too low.  He also told us we could just start checking her blood sugars at home twice a week, so that is what we are doing.  The girls are looking at her skin daily and they tell me that her skin breakdown is healing well-- getting better every week.  They are taking good care of her skin.  I don't need the resource for the porch repair anymore-- I just hired someone to replace her porch and the work starts next week;"  Daughter denies specific clinical concerns today and reports patient is "doing great" Confirmed with caregiver, no questions/ concerns around medications: she continues managing patient's medications; granddaughters that reside with patient provide medication daily when it is time; confirms no recent changes to medications- reports 100% adherence to medication regimen Re- confirmed that patient's granddaughters whom she lives with monitor/ record blood sugars at home-- however, does not have specific values to share today- caregiver is at work and does not have list of home values- states PCP has approved blood sugar monitoring twice weekly as of office visit on 05/25/22 "because her numbers are so good" Confirmed patient's granddaughters whom reside with  her continue periodic blood pressure monitoring at home: daughter does not have specific blood pressure readings to review with me today, but reports "they are all normal and look fine" Confirmed with caregiver home health services currently active and ongoing with [facility]-- reports "  going fine; making regular visits;" however, caregiver verbalizes some frustration with their scheduling process, states, "they call me all the time and switch the visit schedule around;" encouraged her ongoing communication/ patient participation with home health team Reinforced previously provided education  re: basic skin care with need for regular/ daily skin assessment, given recent caregiver report that patient is incontinent of both urine and stool: she reports she has shared information with patient's granddaughters, who manage toileting needs and "change her depends" several times each day; reinforced possible complications of skin breakdown and strategies to prevent skin breakdown-- daughter verbalized she discussed with patient's granddaughters who care for her at home: states they told her "she has very small areas on her bottom that they care for and keep clean and as dry as they can; they tell me that the areas have actually improved and are getting smaller every week since she got home from the hospital- daughter will continue to monitor/ communicate with patient's granddaughters at home, who provide daily care for patient Full medication review with updating medication list in EHR per patient caregiver report:  no concerns or discrepancies identified; caregiver manages medications and denies questions/ concerns around medications today- daughter continues to prepare medications in pill planner box weekly, and then granddaughters who reside with patient ensure they administer daily as prescribed Reviewed upcoming provider office visits: 06/04/23: neurology provider-- confirmed daughter aware of/ has plans to transport- attend along with patient as scheduled  Patient Goals/Self-Care Activities: Participate in Transition of Care Program/Attend TOC scheduled calls Take all medications as prescribed Attend all scheduled provider appointments Call provider office for new concerns or questions  Use assistive devices as needed to prevent falls Continue working with the home health team that is involved in your care If you believe your condition is getting worse- contact your care providers (doctors) promptly- reaching out to your doctor early when you have concerns can prevent you from having to go to  the hospital Please go over all of your medications and medication question/ concerns with your care providers (doctor) Please continue to monitor patient's skin daily; keep her skin as clean and dry as possible; change her Depends diapers regularly  Follow Up Plan:  Telephone follow up appointment with care management team member scheduled for:  Friday 06/04/23 at 11:00 am          Plan: Telephone follow up appointment with care management team member scheduled for:   Friday 06/04/23 at 11:00 am  Caryl Pina, RN, BSN, Media planner  Transitions of Care  [facility] - [facility] [phone]: direct office

## 2023-05-28 NOTE — Telephone Encounter (Signed)
Patient had a pt evaluation tooday and pt and family declined pt services

## 2023-06-04 ENCOUNTER — Other Ambulatory Visit: Payer: Self-pay | Admitting: *Deleted

## 2023-06-04 DIAGNOSIS — G40901 Epilepsy, unspecified, not intractable, with status epilepticus: Secondary | ICD-10-CM | POA: Diagnosis not present

## 2023-06-04 DIAGNOSIS — E1165 Type 2 diabetes mellitus with hyperglycemia: Secondary | ICD-10-CM | POA: Diagnosis not present

## 2023-06-04 DIAGNOSIS — G9341 Metabolic encephalopathy: Secondary | ICD-10-CM | POA: Diagnosis not present

## 2023-06-04 DIAGNOSIS — B9620 Unspecified Escherichia coli [E. coli] as the cause of diseases classified elsewhere: Secondary | ICD-10-CM | POA: Diagnosis not present

## 2023-06-04 DIAGNOSIS — N390 Urinary tract infection, site not specified: Secondary | ICD-10-CM | POA: Diagnosis not present

## 2023-06-04 DIAGNOSIS — I130 Hypertensive heart and chronic kidney disease with heart failure and stage 1 through stage 4 chronic kidney disease, or unspecified chronic kidney disease: Secondary | ICD-10-CM | POA: Diagnosis not present

## 2023-06-04 NOTE — Patient Instructions (Signed)
Visit Information  Thank you for taking time to visit with me today. Please don't hesitate to contact me if I can be of assistance to you before our next scheduled telephone appointment.  Your next appointment is by telephone on Friday 06/18/23 at 11:00 am  Please call the care guide team at [phone] if you need to cancel or reschedule your appointment.   Following are the goals we discussed today:  Patient Goals/Self-Care Activities: Take all medications as prescribed Attend all scheduled provider appointments Call provider office for new concerns or questions  Use assistive devices as needed to prevent falls- walker/ wheelchair Continue working with the home health team that is involved in your care If you believe your condition is getting worse- contact your care providers (doctors) promptly- reaching out to your doctor early when you have concerns can prevent you from having to go to the hospital Please continue to monitor patient's skin daily; keep her skin as clean and dry as possible; change her Depends diapers regularly  If you are experiencing a Mental Health or Behavioral Health Crisis or need someone to talk to, please  call the Suicide and Crisis Lifeline: 988 call the [place] National Suicide Prevention Lifeline: 3125396060 or TTY: 279-754-1823 TTY 248-434-7102) to talk to a trained counselor call 1-800-273-TALK (toll free, 24 hour hotline) go to [facility] [address] [place] [phone]) call the [facility] Crisis Line: 970-786-9085 call 911   Caregiver verbalizes understanding of instructions and care plan provided today and agrees to view in MyChart. Active MyChart status and patient understanding of how to access instructions and care plan via MyChart confirmed with caregiver.     Caryl Pina, RN, BSN, Media planner  Transitions of Care  [facility] - [facility] [phone]: direct office

## 2023-06-04 NOTE — Patient Outreach (Signed)
Care Management  Transitions of Care Program Transitions of Care Post-discharge week # 5/ day # 31 TOC 30-day program case closure   06/04/2023 Name: Brandy Gonzales MRN: [id number] DOB: August 30, 1940  Subjective: Brandy Gonzales is a 83 y.o. year old female who is a primary care patient of [name], [name], MD. The Care Management team Engaged with patient Engaged with patient's daughter/ caregiver [name] by telephone to assess and address transitions of care needs.   Consent to Services:  Patient was given information about care management services, agreed to services, and gave verbal consent to participate.  Enrolled 05/04/23; TOC 30-day program case closure: 06/04/23  Assessment: per daughter/ caregiver: "She is doing fine; no issues or problems; I am sick and had to miss work today; I am staying home and keeping in touch with her and the girls (granddaughters reside with and provide daily care for patient) every day by phone.  I have no concerns or issues around her condition."  TOC 30--day outreach completed; patient has successfully met/ accomplished her established goals for [facility] 30-day program without hospital readmission and was scheduled for ongoing follow up with longitudinal RN CM for telephone visit on 06/18/23          SDOH Interventions    Flowsheet Row Patient Outreach from 05/28/2023 in [place] POPULATION HEALTH DEPARTMENT Clinical Support from 05/14/2023 in [facility] [place] Telephone from 05/04/2023 in [place] POPULATION HEALTH DEPARTMENT ED to Hosp-Admission (Discharged) from 04/25/2023 in [facility] 6 NORTH  SURGICAL Telephone from 04/05/2023 in [place] POPULATION HEALTH DEPARTMENT Telephone from 10/19/2022 in [facility] Community Care Coordination  SDOH Interventions        Food Insecurity Interventions Intervention Not Indicated  [Today, caregiver/ daughter [name] denies food insecurity] Intervention Not  Indicated Intervention Not Indicated  [daughter/ caregiver HCPOA denies food insecurity today] Intervention Not Indicated, Inpatient TOC Intervention Not Indicated Intervention Not Indicated  Housing Interventions -- Intervention Not Indicated Intervention Not Indicated -- Intervention Not Indicated Intervention Not Indicated  Transportation Interventions -- Intervention Not Indicated Intervention Not Indicated  [daughter/ caregiver HCPOA provides all transportation] -- Intervention Not Indicated  [daughter/ POA provides all transportation] Intervention Not Indicated  Utilities Interventions -- Intervention Not Indicated Intervention Not Indicated -- Intervention Not Indicated --  Alcohol Usage Interventions -- Intervention Not Indicated (Score <7) -- -- -- --  Financial Strain Interventions -- Intervention Not Indicated -- -- -- --  Physical Activity Interventions -- Intervention Not Indicated -- -- -- --  Stress Interventions -- Intervention Not Indicated -- -- -- --  Social Connections Interventions -- Intervention Not Indicated -- -- -- --  Health Literacy Interventions -- Intervention Not Indicated -- -- -- --        Goals Addressed             This Visit's Progress    TOC 30-day Program Care Plan   On track    Current Barriers:  Medication management daughter manages all aspects of medication management- not currently giving hydralazine- states patient had prior side effects when taking this medication and she is not currently going to administer- reports taking carvedilol 25 mg BID  Provider appointments has HFU appointment with covering NP at PCP office for 05/16/22: however- daughter is working that day and needs to re-schedule: attempted to facilitate re-scheduling, however, none of the options presented worked for the daughter around her work schedule- daughter states she will call PCP office herself  to schedule Home Health services [facility] has been ordered to resume services  post-hospital discharge on 05/03/23- they were previously active/ established in patient's care; daughter declines need for assistance in ensuring they will resume services- states she has their phone number and will call them herself Equipment/DME Needs thorough assessment of DME  RNCM Clinical Goal(s):  Patient will work with the Care Management team over the next 30 days to address Transition of Care Barriers: Medication Management Support at home Provider appointments Home Health services Equipment/DME take all medications exactly as prescribed and will call provider for medication related questions as evidenced by caregiver reporting during [facility] 30-day program outreaches by telephone attend all scheduled medical appointments: 05/16/22- PCP: depending on what daughter is able to get re-scheduled, around her work schedule as evidenced by review of same with caregiver during [facility] 30-day program outreaches  05/14/23: daughter again reports she is unable to complete this visit and will re-schedule; she declines my assistance in doing so; states she will schedule when she takes patient for AWE scheduled later this afternoon not experience hospital admission as evidenced by review of EMR. Hospital Admissions in last 6 months = 5  through collaboration with RN Care manager, provider, and care team.   Interventions: Evaluation of current treatment plan related to  self management and patient's adherence to plan as established by provider  Transitions of Care:  Goal Met. 06/04/23 Doctor Visits  - discussed the importance of doctor visits Discussed current clinical condition with daughter/ caregiver: "She is doing fine; no issues or problems; I am sick and had to miss work today; I am staying home and keeping in touch with her and the girls (granddaughters reside with and provide daily care for patient) every day by phone.  I have no concerns or issues around her condition." Reviewed upcoming neurology  provider appointment on 06/25/23: re- confirmed daughter aware of/ has plans to transport- attend along with patient as scheduled Confirmed with caregiver, no questions/ concerns around medications: she continues managing patient's medications; granddaughters that reside with patient provide medication daily when it is time; confirms no recent changes to medications- reports 100% adherence to medication regimen Re- confirmed that patient's granddaughters whom she lives with occasionally monitor/ record blood sugars/ blood pressures at home-- however, caregiver does not have specific values to share today- caregiver is sick and at home today- reports no changes to patient's general plan of care at home: reiterates that PCP advised monitoring blood sugars and blood sugars "once or twice a week" at home, which she reports is being completed; states "nothing out of the ordinary, nothing concerning" with recent monitoring at home, per patient's granddaughters who care for her day-day Confirmed with caregiver home health services currently active and ongoing with [facility]-- reports "going fine; making regular visits;" denies concerns around home health services, aside from general scheduling issues due to caregiver's work schedule- confirms she has phone number for home health team Reinforced previously provided education re: basic skin care with need for regular/ daily skin assessment, given caregiver report that patient is incontinent of both urine and stool: she again reports patient's granddaughters manage toileting needs and "change her depends" several times each day; states she has provided additional guidance/ education to granddaughters; reinforced possible complications of skin breakdown and strategies to prevent skin breakdown-- daughter verbalizes very good understanding of same and again reports "very small areas on her bottom that they care for and keep clean and as dry as they can; they tell  me that the  areas have actually improved and are getting smaller every week since she got home from the hospital;"  confirmed patient continues to wear depends "all the time" TOC 30--day outreach completed; patient has successfully met/ accomplished her established goals for [facility] 30-day program without hospital readmission and was scheduled for ongoing follow up with longitudinal RN CM for telephone visit on 06/18/23  Patient Goals/Self-Care Activities: Take all medications as prescribed Attend all scheduled provider appointments Call provider office for new concerns or questions  Use assistive devices as needed to prevent falls- walker/ wheelchair Continue working with the home health team that is involved in your care If you believe your condition is getting worse- contact your care providers (doctors) promptly- reaching out to your doctor early when you have concerns can prevent you from having to go to the hospital Please continue to monitor patient's skin daily; keep her skin as clean and dry as possible; change her Depends diapers regularly  Follow Up Plan:  Telephone follow up appointment with care management team member scheduled for:  Friday 06/18/23 at 11:00 am with longitudinal RN CM          Plan: Telephone follow up appointment with care management team member scheduled for:   Friday 06/18/23 at 11:00 am with longitudinal RN CM [place]  Caryl Pina, RN, BSN, CCRN Alumnus RN Care Manager  Transitions of Care  [facility] - [facility] [phone]: direct office

## 2023-06-07 DIAGNOSIS — I130 Hypertensive heart and chronic kidney disease with heart failure and stage 1 through stage 4 chronic kidney disease, or unspecified chronic kidney disease: Secondary | ICD-10-CM | POA: Diagnosis not present

## 2023-06-07 DIAGNOSIS — G9341 Metabolic encephalopathy: Secondary | ICD-10-CM | POA: Diagnosis not present

## 2023-06-07 DIAGNOSIS — B9620 Unspecified Escherichia coli [E. coli] as the cause of diseases classified elsewhere: Secondary | ICD-10-CM | POA: Diagnosis not present

## 2023-06-07 DIAGNOSIS — N390 Urinary tract infection, site not specified: Secondary | ICD-10-CM | POA: Diagnosis not present

## 2023-06-07 DIAGNOSIS — E1165 Type 2 diabetes mellitus with hyperglycemia: Secondary | ICD-10-CM | POA: Diagnosis not present

## 2023-06-07 DIAGNOSIS — G40901 Epilepsy, unspecified, not intractable, with status epilepticus: Secondary | ICD-10-CM | POA: Diagnosis not present

## 2023-06-18 ENCOUNTER — Ambulatory Visit: Payer: Self-pay

## 2023-06-18 NOTE — Patient Instructions (Signed)
 Visit Information  Thank you for taking time to visit with me today. Please don't hesitate to contact me if I can be of assistance to you.   Following are the goals we discussed today:  Continue to take medications as prescribed. Continue to attend provider visits as scheduled Continue to eat healthy, lean meats, vegetables, fruits, avoid saturated and transfats Contact provider with health questions or concerns as needed Continue to check blood sugar as recommended and notify provider if questions or concerns Continue to check blood pressure as recommended by provider and as needed. Contact provider if questions or concerns   Our next appointment is by telephone on 07/16/23 at 11:00 am  Please call the care guide team at [phone] if you need to cancel or reschedule your appointment.   If you are experiencing a Mental Health or Behavioral Health Crisis or need someone to talk to, please call the Suicide and Crisis Lifeline: 988 call the [place]  National Suicide Prevention Lifeline: 872-248-9109 or TTY: 706 284 5924 TTY 646-407-2902) to talk to a trained counselor   Heddy Shutter, RN, MSN, BSN, CCM [place]  [facility], Population Health Case Manager Phone: [phone]

## 2023-06-18 NOTE — Patient Outreach (Signed)
  Care Coordination   Initial Visit Note   06/18/2023 Name: Brandy Gonzales MRN: [id number] DOB: Dec 19, 1940  Brandy Gonzales is a 83 y.o. year old female who sees [name], Aleksei V, MD for primary care. I spoke with daughter, [name] (DPR), by phone today.  What matters to the patients health and wellness today?  Referral from [facility] nurse. Per daughter, patient is doing well. She has had follow up visit with PCP on 05/26/23. Daughter reports that patient's grandchildren are taking care of patient at home. She states they keep up with patient blood pressure and blood sugar readings. Daughter states, per PCP, they only have to check patient BP and BS once a week now. Daughter is responsible for managing patients medications and appointments. She denies any education al needs at this time. Per daughter, patient is not not getting up as much as she was when therapist were involved.   Goals Addressed             This Visit's Progress    Maintain or improve health       Interventions Today    Flowsheet Row Most Recent Value  Chronic Disease   Chronic disease during today's visit Other, Hypertension (HTN), Diabetes  [alzheimers disease]  General Interventions   General Interventions Discussed/Reviewed General Interventions Discussed, Doctor Visits  [Evaluation of current treatment plan for health condition and patient's adherence to plan.]  Doctor Visits Discussed/Reviewed Doctor Visits Discussed, PCP, Specialist  PCP/Specialist Visits Compliance with follow-up visit  [reviewed upcoming provider visits. confirmed patient has transportation]  Exercise Interventions   Exercise Discussed/Reviewed Exercise Discussed, Physical Activity  Physical Activity Discussed/Reviewed Physical Activity Reviewed  Education Interventions   Education Provided Provided Education  [discussed importance of socialization with alzheimer-provided contact number for [employer] [phone]  Provided Verbal  Education On Blood Sugar Monitoring, When to see the doctor, Medication, Community Resources  [advised to take medications as prescribed, attend provider visits as scheduled, contact provider with health questions/concern, discussed importance of routine check of BP/BS.]  Pharmacy Interventions   Pharmacy Dicussed/Reviewed Pharmacy Topics Reviewed  [medciations reviewed]  Safety Interventions   Safety Discussed/Reviewed Safety Discussed            SDOH assessments and interventions completed:  Yes recently completed.   Care Coordination Interventions:  Yes, provided   Follow up plan: Follow up call scheduled for 07/16/23    Encounter Outcome:  Patient Visit Completed   Heddy Shutter, RN, MSN, BSN, CCM [place]  [facility], Population Health Case Manager Phone: [phone]

## 2023-06-25 ENCOUNTER — Ambulatory Visit (INDEPENDENT_AMBULATORY_CARE_PROVIDER_SITE_OTHER): Payer: Medicare Other | Admitting: Neurology

## 2023-06-25 ENCOUNTER — Encounter: Payer: Self-pay | Admitting: Neurology

## 2023-06-25 VITALS — BP 156/92 | HR 73 | Ht 59.0 in

## 2023-06-25 DIAGNOSIS — F03B0 Unspecified dementia, moderate, without behavioral disturbance, psychotic disturbance, mood disturbance, and anxiety: Secondary | ICD-10-CM

## 2023-06-25 DIAGNOSIS — Z5181 Encounter for therapeutic drug level monitoring: Secondary | ICD-10-CM | POA: Diagnosis not present

## 2023-06-25 DIAGNOSIS — G40909 Epilepsy, unspecified, not intractable, without status epilepticus: Secondary | ICD-10-CM

## 2023-06-25 MED ORDER — LEVETIRACETAM 500 MG PO TABS: 500.0000 mg | ORAL_TABLET | Freq: Two times a day (BID) | ORAL | 3 refills | Status: DC

## 2023-06-25 NOTE — Patient Instructions (Addendum)
Decrease Keppra to 500 mg twice daily Will check a Keppra level today Continue on your other medications Please contact me if you do have a mental seizure Follow-up in 6 months or sooner if worse
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
## 2023-06-29 ENCOUNTER — Encounter: Payer: Self-pay | Admitting: Neurology

## 2023-06-29 LAB — LEVETIRACETAM LEVEL: Levetiracetam Lvl: 43.9 ug/mL — ABNORMAL HIGH (ref 10.0–40.0)

## 2023-07-08 ENCOUNTER — Encounter (HOSPITAL_COMMUNITY): Payer: Self-pay

## 2023-07-08 ENCOUNTER — Emergency Department (HOSPITAL_COMMUNITY): Payer: Medicare Other

## 2023-07-08 ENCOUNTER — Inpatient Hospital Stay (HOSPITAL_COMMUNITY)
Admission: EM | Admit: 2023-07-08 | Discharge: 2023-07-10 | DRG: 056 | Disposition: A | Discharge status: Hospice | Payer: Medicare Other | Attending: Family Medicine | Admitting: Family Medicine

## 2023-07-08 ENCOUNTER — Other Ambulatory Visit: Payer: Self-pay

## 2023-07-08 DIAGNOSIS — E860 Dehydration: Secondary | ICD-10-CM | POA: Diagnosis present

## 2023-07-08 DIAGNOSIS — E43 Unspecified severe protein-calorie malnutrition: Secondary | ICD-10-CM | POA: Diagnosis present

## 2023-07-08 DIAGNOSIS — G9340 Encephalopathy, unspecified: Principal | ICD-10-CM

## 2023-07-08 DIAGNOSIS — R64 Cachexia: Secondary | ICD-10-CM | POA: Diagnosis present

## 2023-07-08 DIAGNOSIS — E1121 Type 2 diabetes mellitus with diabetic nephropathy: Secondary | ICD-10-CM | POA: Diagnosis present

## 2023-07-08 DIAGNOSIS — I700 Atherosclerosis of aorta: Secondary | ICD-10-CM | POA: Diagnosis not present

## 2023-07-08 DIAGNOSIS — R739 Hyperglycemia, unspecified: Secondary | ICD-10-CM | POA: Diagnosis not present

## 2023-07-08 DIAGNOSIS — Z681 Body mass index (BMI) 19 or less, adult: Secondary | ICD-10-CM

## 2023-07-08 DIAGNOSIS — Z7984 Long term (current) use of oral hypoglycemic drugs: Secondary | ICD-10-CM

## 2023-07-08 DIAGNOSIS — E1122 Type 2 diabetes mellitus with diabetic chronic kidney disease: Secondary | ICD-10-CM | POA: Diagnosis present

## 2023-07-08 DIAGNOSIS — K5730 Diverticulosis of large intestine without perforation or abscess without bleeding: Secondary | ICD-10-CM | POA: Diagnosis not present

## 2023-07-08 DIAGNOSIS — Z5986 Financial insecurity: Secondary | ICD-10-CM

## 2023-07-08 DIAGNOSIS — E872 Acidosis, unspecified: Secondary | ICD-10-CM | POA: Diagnosis present

## 2023-07-08 DIAGNOSIS — E875 Hyperkalemia: Secondary | ICD-10-CM

## 2023-07-08 DIAGNOSIS — Z800 Family history of malignant neoplasm of digestive organs: Secondary | ICD-10-CM

## 2023-07-08 DIAGNOSIS — N1832 Chronic kidney disease, stage 3b: Secondary | ICD-10-CM | POA: Diagnosis present

## 2023-07-08 DIAGNOSIS — R918 Other nonspecific abnormal finding of lung field: Secondary | ICD-10-CM | POA: Diagnosis not present

## 2023-07-08 DIAGNOSIS — F0280 Dementia in other diseases classified elsewhere without behavioral disturbance: Secondary | ICD-10-CM | POA: Diagnosis present

## 2023-07-08 DIAGNOSIS — G301 Alzheimer's disease with late onset: Secondary | ICD-10-CM | POA: Diagnosis not present

## 2023-07-08 DIAGNOSIS — E1165 Type 2 diabetes mellitus with hyperglycemia: Secondary | ICD-10-CM | POA: Diagnosis present

## 2023-07-08 DIAGNOSIS — E878 Other disorders of electrolyte and fluid balance, not elsewhere classified: Secondary | ICD-10-CM | POA: Diagnosis present

## 2023-07-08 DIAGNOSIS — Z841 Family history of disorders of kidney and ureter: Secondary | ICD-10-CM

## 2023-07-08 DIAGNOSIS — F02C18 Dementia in other diseases classified elsewhere, severe, with other behavioral disturbance: Secondary | ICD-10-CM | POA: Diagnosis not present

## 2023-07-08 DIAGNOSIS — Z66 Do not resuscitate: Secondary | ICD-10-CM | POA: Diagnosis present

## 2023-07-08 DIAGNOSIS — N179 Acute kidney failure, unspecified: Secondary | ICD-10-CM | POA: Diagnosis present

## 2023-07-08 DIAGNOSIS — I10 Essential (primary) hypertension: Secondary | ICD-10-CM | POA: Diagnosis present

## 2023-07-08 DIAGNOSIS — Z7401 Bed confinement status: Secondary | ICD-10-CM

## 2023-07-08 DIAGNOSIS — R1013 Epigastric pain: Secondary | ICD-10-CM | POA: Diagnosis not present

## 2023-07-08 DIAGNOSIS — Z8249 Family history of ischemic heart disease and other diseases of the circulatory system: Secondary | ICD-10-CM

## 2023-07-08 DIAGNOSIS — E785 Hyperlipidemia, unspecified: Secondary | ICD-10-CM | POA: Diagnosis present

## 2023-07-08 DIAGNOSIS — N390 Urinary tract infection, site not specified: Secondary | ICD-10-CM

## 2023-07-08 DIAGNOSIS — G40909 Epilepsy, unspecified, not intractable, without status epilepticus: Secondary | ICD-10-CM | POA: Diagnosis present

## 2023-07-08 DIAGNOSIS — K219 Gastro-esophageal reflux disease without esophagitis: Secondary | ICD-10-CM | POA: Diagnosis present

## 2023-07-08 DIAGNOSIS — Z515 Encounter for palliative care: Secondary | ICD-10-CM

## 2023-07-08 DIAGNOSIS — Z803 Family history of malignant neoplasm of breast: Secondary | ICD-10-CM

## 2023-07-08 DIAGNOSIS — I129 Hypertensive chronic kidney disease with stage 1 through stage 4 chronic kidney disease, or unspecified chronic kidney disease: Secondary | ICD-10-CM | POA: Diagnosis present

## 2023-07-08 DIAGNOSIS — Z604 Social exclusion and rejection: Secondary | ICD-10-CM | POA: Diagnosis present

## 2023-07-08 DIAGNOSIS — I6523 Occlusion and stenosis of bilateral carotid arteries: Secondary | ICD-10-CM | POA: Diagnosis not present

## 2023-07-08 DIAGNOSIS — Z95828 Presence of other vascular implants and grafts: Secondary | ICD-10-CM | POA: Diagnosis not present

## 2023-07-08 DIAGNOSIS — G309 Alzheimer's disease, unspecified: Principal | ICD-10-CM | POA: Diagnosis present

## 2023-07-08 DIAGNOSIS — R404 Transient alteration of awareness: Secondary | ICD-10-CM | POA: Diagnosis not present

## 2023-07-08 DIAGNOSIS — R627 Adult failure to thrive: Secondary | ICD-10-CM | POA: Diagnosis present

## 2023-07-08 DIAGNOSIS — Z91014 Allergy to mammalian meats: Secondary | ICD-10-CM

## 2023-07-08 DIAGNOSIS — I6782 Cerebral ischemia: Secondary | ICD-10-CM | POA: Diagnosis not present

## 2023-07-08 DIAGNOSIS — H409 Unspecified glaucoma: Secondary | ICD-10-CM | POA: Diagnosis present

## 2023-07-08 DIAGNOSIS — Z91128 Patient's intentional underdosing of medication regimen for other reason: Secondary | ICD-10-CM

## 2023-07-08 DIAGNOSIS — D696 Thrombocytopenia, unspecified: Secondary | ICD-10-CM | POA: Diagnosis present

## 2023-07-08 DIAGNOSIS — Z7982 Long term (current) use of aspirin: Secondary | ICD-10-CM

## 2023-07-08 DIAGNOSIS — Z86718 Personal history of other venous thrombosis and embolism: Secondary | ICD-10-CM

## 2023-07-08 DIAGNOSIS — Z885 Allergy status to narcotic agent status: Secondary | ICD-10-CM

## 2023-07-08 DIAGNOSIS — R531 Weakness: Secondary | ICD-10-CM | POA: Diagnosis not present

## 2023-07-08 DIAGNOSIS — Z7189 Other specified counseling: Secondary | ICD-10-CM

## 2023-07-08 DIAGNOSIS — E870 Hyperosmolality and hypernatremia: Secondary | ICD-10-CM

## 2023-07-08 DIAGNOSIS — Z86711 Personal history of pulmonary embolism: Secondary | ICD-10-CM

## 2023-07-08 DIAGNOSIS — K828 Other specified diseases of gallbladder: Secondary | ICD-10-CM | POA: Diagnosis not present

## 2023-07-08 DIAGNOSIS — Z1152 Encounter for screening for COVID-19: Secondary | ICD-10-CM | POA: Diagnosis not present

## 2023-07-08 DIAGNOSIS — Z79899 Other long term (current) drug therapy: Secondary | ICD-10-CM

## 2023-07-08 DIAGNOSIS — K8020 Calculus of gallbladder without cholecystitis without obstruction: Secondary | ICD-10-CM | POA: Diagnosis present

## 2023-07-08 DIAGNOSIS — Z888 Allergy status to other drugs, medicaments and biological substances status: Secondary | ICD-10-CM

## 2023-07-08 DIAGNOSIS — Z8049 Family history of malignant neoplasm of other genital organs: Secondary | ICD-10-CM

## 2023-07-08 DIAGNOSIS — Z833 Family history of diabetes mellitus: Secondary | ICD-10-CM

## 2023-07-08 DIAGNOSIS — K838 Other specified diseases of biliary tract: Secondary | ICD-10-CM | POA: Diagnosis not present

## 2023-07-08 DIAGNOSIS — Z886 Allergy status to analgesic agent status: Secondary | ICD-10-CM

## 2023-07-08 DIAGNOSIS — Z5941 Food insecurity: Secondary | ICD-10-CM

## 2023-07-08 DIAGNOSIS — R109 Unspecified abdominal pain: Secondary | ICD-10-CM | POA: Diagnosis not present

## 2023-07-08 LAB — COMPREHENSIVE METABOLIC PANEL
ALT: 40 U/L (ref 0–44)
AST: 23 U/L (ref 15–41)
Albumin: 2.5 g/dL — ABNORMAL LOW (ref 3.5–5.0)
Alkaline Phosphatase: 104 U/L (ref 38–126)
BUN: 120 mg/dL — ABNORMAL HIGH (ref 8–23)
CO2: 20 mmol/L — ABNORMAL LOW (ref 22–32)
Calcium: 8.6 mg/dL — ABNORMAL LOW (ref 8.9–10.3)
Chloride: >130 mmol/L (ref 98–111)
Creatinine, Ser: 2.5 mg/dL — ABNORMAL HIGH (ref 0.44–1.00)
GFR, Estimated: 19 mL/min — ABNORMAL LOW (ref >60)
Glucose, Bld: 299 mg/dL — ABNORMAL HIGH (ref 70–99)
Potassium: 5.8 mmol/L — ABNORMAL HIGH (ref 3.5–5.1)
Sodium: 159 mmol/L — ABNORMAL HIGH (ref 135–145)
Total Bilirubin: 1.7 mg/dL — ABNORMAL HIGH (ref 0.0–1.2)
Total Protein: 5.2 g/dL — ABNORMAL LOW (ref 6.5–8.1)

## 2023-07-08 LAB — URINALYSIS, ROUTINE W REFLEX MICROSCOPIC
Bilirubin Urine: NEGATIVE
Glucose, UA: NEGATIVE mg/dL
Ketones, ur: NEGATIVE mg/dL
Nitrite: NEGATIVE
Protein, ur: 30 mg/dL — AB
Specific Gravity, Urine: 1.013 (ref 1.005–1.030)
WBC, UA: >50 WBC/hpf (ref 0–5)
pH: 5 (ref 5.0–8.0)

## 2023-07-08 LAB — CBC WITH DIFFERENTIAL/PLATELET
Abs Immature Granulocytes: 0.08 10*3/uL — ABNORMAL HIGH (ref 0.00–0.07)
Basophils Absolute: 0 10*3/uL (ref 0.0–0.1)
Basophils Relative: 0 %
Eosinophils Absolute: 0 10*3/uL (ref 0.0–0.5)
Eosinophils Relative: 0 %
HCT: 47.2 % — ABNORMAL HIGH (ref 36.0–46.0)
Hemoglobin: 14.4 g/dL (ref 12.0–15.0)
Immature Granulocytes: 1 %
Lymphocytes Relative: 11 %
Lymphs Abs: 1.7 10*3/uL (ref 0.7–4.0)
MCH: 31.6 pg (ref 26.0–34.0)
MCHC: 30.5 g/dL (ref 30.0–36.0)
MCV: 103.5 fL — ABNORMAL HIGH (ref 80.0–100.0)
Monocytes Absolute: 0.8 10*3/uL (ref 0.1–1.0)
Monocytes Relative: 5 %
Neutro Abs: 12.9 10*3/uL — ABNORMAL HIGH (ref 1.7–7.7)
Neutrophils Relative %: 83 %
Platelets: 91 10*3/uL — ABNORMAL LOW (ref 150–400)
RBC: 4.56 MIL/uL (ref 3.87–5.11)
RDW: 14.3 % (ref 11.5–15.5)
WBC: 15.5 10*3/uL — ABNORMAL HIGH (ref 4.0–10.5)
nRBC: 0 % (ref 0.0–0.2)

## 2023-07-08 LAB — RESP PANEL BY RT-PCR (RSV, FLU A&B, COVID)  RVPGX2
Influenza A by PCR: NEGATIVE
Influenza B by PCR: NEGATIVE
Resp Syncytial Virus by PCR: NEGATIVE
SARS Coronavirus 2 by RT PCR: NEGATIVE

## 2023-07-08 LAB — TROPONIN I (HIGH SENSITIVITY)
Troponin I (High Sensitivity): 52 ng/L — ABNORMAL HIGH (ref <18)
Troponin I (High Sensitivity): 48 ng/L — ABNORMAL HIGH (ref <18)

## 2023-07-08 LAB — CBG MONITORING, ED: Glucose-Capillary: 291 mg/dL — ABNORMAL HIGH (ref 70–99)

## 2023-07-08 LAB — MAGNESIUM: Magnesium: 3 mg/dL — ABNORMAL HIGH (ref 1.7–2.4)

## 2023-07-08 LAB — LIPASE, BLOOD: Lipase: 27 U/L (ref 11–51)

## 2023-07-08 MED ORDER — DEXTROSE 50 % IV SOLN
1.0000 | Freq: Once | INTRAVENOUS | Status: AC
  Administered 2023-07-08: 50 mL via INTRAVENOUS
  Filled 2023-07-08: qty 50

## 2023-07-08 MED ORDER — SODIUM CHLORIDE 0.9 % IV SOLN
1.0000 g | Freq: Once | INTRAVENOUS | Status: AC
  Administered 2023-07-08: 1 g via INTRAVENOUS
  Filled 2023-07-08: qty 10

## 2023-07-08 MED ORDER — DEXTROSE-SODIUM CHLORIDE 5-0.45 % IV SOLN: INTRAVENOUS | Status: DC

## 2023-07-08 MED ORDER — SODIUM CHLORIDE 0.9 % IV BOLUS
1000.0000 mL | Freq: Once | INTRAVENOUS | Status: AC
  Administered 2023-07-08: 1000 mL via INTRAVENOUS

## 2023-07-08 MED ORDER — INSULIN ASPART 100 UNIT/ML IV SOLN
5.0000 [IU] | Freq: Once | INTRAVENOUS | Status: AC
  Administered 2023-07-08: 5 [IU] via INTRAVENOUS
  Filled 2023-07-08: qty 0.05

## 2023-07-08 NOTE — ED Provider Notes (Signed)
 [place] EMERGENCY DEPARTMENT AT [facility] Provider Note   CSN: [id number] Arrival date & time: 07/08/23  1452     History {Add pertinent medical, surgical, social history, OB history to HPI:1} Chief Complaint  Patient presents with   Failure To Thrive    Brandy Gonzales is a 83 y.o. female.  Level 5 caveat secondary to dementia.  Patient brought in by ambulance from home where she lives with her son who is helping with the history.  He said at baseline she is bedbound, cannot feed herself.  Talks but does not recognize family.  For the last few weeks she has been not eating at all not taking her medicines.  Usually feed her baby food but she has been not swallowing it now.  No cough vomiting diarrhea.  No known fever.  The history is provided by a relative. The history is limited by the condition of the patient.  Altered Mental Status Presenting symptoms: lethargy   Episode history:  Continuous Timing:  Constant Progression:  Worsening Chronicity:  New      Home Medications Prior to Admission medications   Medication Sig Start Date End Date Taking? Authorizing Provider  acetaminophen (TYLENOL) 500 MG tablet Take 500-1,000 mg by mouth every 6 (six) hours as needed for moderate pain (pain score 4-6).    [provider]  aspirin EC 81 MG tablet Take 81 mg by mouth once a week. Swallow whole on wednesday    [provider]  carvedilol (COREG) 25 MG tablet TAKE 1 TABLET(25 MG) BY MOUTH TWICE DAILY WITH A MEAL Patient taking differently: Take 12.5 mg by mouth 2 (two) times daily with a meal. 03/18/23   [name], [name], MD  glimepiride (AMARYL) 2 MG tablet Take 0.5 tablets (1 mg total) by mouth daily before breakfast. 02/09/23   [name], [name], MD  Glucagon (GVOKE HYPOPEN 1-PACK) 1 MG/0.2ML SOAJ Inject 0.2 mLs into the skin daily as needed. 02/09/23   [name], [name], MD  levETIRAcetam (KEPPRA) 500 MG tablet Take 1 tablet (500 mg total) by  mouth 2 (two) times daily. 06/25/23 06/19/24  Windell Norfolk, MD  omeprazole (PRILOSEC) 20 MG capsule TAKE 1 CAPSULE(20 MG) BY MOUTH DAILY 03/18/23   [name], [name], MD      Allergies    Amlodipine besylate, Atenolol, Oxycodone hcl, Pork-derived products, Propoxyphene n-acetaminophen, and Enalapril maleate    Review of Systems   Review of Systems  Unable to perform ROS: Dementia    Physical Exam Updated Vital Signs BP (!) 129/99 (BP Location: Left Arm)   Pulse 91   Temp 98.2 F (36.8 C) (Rectal)   Resp 18   Ht 4\' 11"  (1.499 m)   Wt 36.3 kg   SpO2 98%   BMI 16.16 kg/m  Physical Exam Vitals and nursing note reviewed.  Constitutional:      General: She is not in acute distress.    Appearance: Normal appearance. She is well-developed.  HENT:     Head: Normocephalic and atraumatic.  Eyes:     Conjunctiva/sclera: Conjunctivae normal.  Cardiovascular:     Rate and Rhythm: Normal rate and regular rhythm.     Heart sounds: No murmur heard. Pulmonary:     Effort: Pulmonary effort is normal. No respiratory distress.     Breath sounds: Normal breath sounds.  Abdominal:     Palpations: Abdomen is soft.     Tenderness: There is no abdominal tenderness. There is no guarding or  rebound.  Musculoskeletal:        General: No swelling.     Cervical back: Neck supple.  Skin:    General: Skin is warm and dry.     Capillary Refill: Capillary refill takes less than 2 seconds.     Comments: She has a sacral superficial ulceration approximately 5 cm  Neurological:     Comments: She will open her eyes but does not speak and not following any type of commands to perform a neurologic exam.     ED Results / Procedures / Treatments   Labs (all labs ordered are listed, but only abnormal results are displayed) Labs Reviewed  RESP PANEL BY RT-PCR (RSV, FLU A&B, COVID)  [id number]  COMPREHENSIVE METABOLIC PANEL  LIPASE, BLOOD  CBC WITH DIFFERENTIAL/PLATELET  URINALYSIS, ROUTINE W REFLEX  MICROSCOPIC  TROPONIN I (HIGH SENSITIVITY)    EKG None  Radiology No results found.  Procedures Procedures  {Document cardiac monitor, telemetry assessment procedure when appropriate:1}  Medications Ordered in ED Medications  sodium chloride 0.9 % bolus 1,000 mL (has no administration in time range)    ED Course/ Medical Decision Making/ A&P   {   Click here for ABCD2, HEART and other calculatorsREFRESH Note before signing :1}                              Medical Decision Making Amount and/or Complexity of Data Reviewed Labs: ordered. Radiology: ordered.   This patient complains of ***; this involves an extensive number of treatment Options and is a complaint that carries with it a high risk of complications and morbidity. The differential includes ***  I ordered, reviewed and interpreted labs, which included *** I ordered medication *** and reviewed PMP when indicated. I ordered imaging studies which included *** and I independently    visualized and interpreted imaging which showed *** Additional history obtained from *** Previous records obtained and reviewed *** I consulted *** and discussed lab and imaging findings and discussed disposition.  Cardiac monitoring reviewed, *** Social determinants considered, *** Critical Interventions: ***  After the interventions stated above, I reevaluated the patient and found *** Admission and further testing considered, ***   {Document critical care time when appropriate:1} {Document review of labs and clinical decision tools ie heart score, Chads2Vasc2 etc:1}  {Document your independent review of radiology images, and any outside records:1} {Document your discussion with family members, caretakers, and with consultants:1} {Document social determinants of health affecting pt's care:1} {Document your decision making why or why not admission, treatments were needed:1} Final Clinical Impression(s) / ED Diagnoses Final  diagnoses:  None    Rx / DC Orders ED Discharge Orders     None

## 2023-07-08 NOTE — ED Triage Notes (Addendum)
 Patient BIB [facility] from home. Has not been taking her oral medications, eating or drinking for 2 days. Is scratching at a sore on her buttocks. No responding to questions in triage.

## 2023-07-08 NOTE — ED Notes (Signed)
 Tried to get patients tempeture 3 times was unsuccessful.

## 2023-07-08 NOTE — ED Notes (Signed)
 [place] at bedside

## 2023-07-09 DIAGNOSIS — G309 Alzheimer's disease, unspecified: Secondary | ICD-10-CM | POA: Diagnosis present

## 2023-07-09 DIAGNOSIS — R64 Cachexia: Secondary | ICD-10-CM | POA: Diagnosis present

## 2023-07-09 DIAGNOSIS — Z7189 Other specified counseling: Secondary | ICD-10-CM | POA: Diagnosis not present

## 2023-07-09 DIAGNOSIS — E43 Unspecified severe protein-calorie malnutrition: Secondary | ICD-10-CM | POA: Diagnosis present

## 2023-07-09 DIAGNOSIS — E785 Hyperlipidemia, unspecified: Secondary | ICD-10-CM | POA: Diagnosis present

## 2023-07-09 DIAGNOSIS — F02C18 Dementia in other diseases classified elsewhere, severe, with other behavioral disturbance: Secondary | ICD-10-CM | POA: Diagnosis not present

## 2023-07-09 DIAGNOSIS — E860 Dehydration: Secondary | ICD-10-CM | POA: Diagnosis present

## 2023-07-09 DIAGNOSIS — Z66 Do not resuscitate: Secondary | ICD-10-CM | POA: Diagnosis present

## 2023-07-09 DIAGNOSIS — N390 Urinary tract infection, site not specified: Secondary | ICD-10-CM

## 2023-07-09 DIAGNOSIS — R404 Transient alteration of awareness: Secondary | ICD-10-CM | POA: Diagnosis not present

## 2023-07-09 DIAGNOSIS — E875 Hyperkalemia: Secondary | ICD-10-CM | POA: Diagnosis present

## 2023-07-09 DIAGNOSIS — Z515 Encounter for palliative care: Secondary | ICD-10-CM

## 2023-07-09 DIAGNOSIS — N179 Acute kidney failure, unspecified: Secondary | ICD-10-CM | POA: Diagnosis present

## 2023-07-09 DIAGNOSIS — Z1152 Encounter for screening for COVID-19: Secondary | ICD-10-CM | POA: Diagnosis not present

## 2023-07-09 DIAGNOSIS — Z681 Body mass index (BMI) 19 or less, adult: Secondary | ICD-10-CM | POA: Diagnosis not present

## 2023-07-09 DIAGNOSIS — E1165 Type 2 diabetes mellitus with hyperglycemia: Secondary | ICD-10-CM | POA: Diagnosis present

## 2023-07-09 DIAGNOSIS — D696 Thrombocytopenia, unspecified: Secondary | ICD-10-CM | POA: Diagnosis present

## 2023-07-09 DIAGNOSIS — F0280 Dementia in other diseases classified elsewhere without behavioral disturbance: Secondary | ICD-10-CM | POA: Diagnosis present

## 2023-07-09 DIAGNOSIS — E872 Acidosis, unspecified: Secondary | ICD-10-CM | POA: Diagnosis present

## 2023-07-09 DIAGNOSIS — G301 Alzheimer's disease with late onset: Secondary | ICD-10-CM | POA: Diagnosis not present

## 2023-07-09 DIAGNOSIS — R627 Adult failure to thrive: Secondary | ICD-10-CM

## 2023-07-09 DIAGNOSIS — E878 Other disorders of electrolyte and fluid balance, not elsewhere classified: Secondary | ICD-10-CM | POA: Diagnosis present

## 2023-07-09 DIAGNOSIS — G40909 Epilepsy, unspecified, not intractable, without status epilepticus: Secondary | ICD-10-CM | POA: Diagnosis present

## 2023-07-09 DIAGNOSIS — E1122 Type 2 diabetes mellitus with diabetic chronic kidney disease: Secondary | ICD-10-CM | POA: Diagnosis present

## 2023-07-09 DIAGNOSIS — N1832 Chronic kidney disease, stage 3b: Secondary | ICD-10-CM | POA: Diagnosis present

## 2023-07-09 DIAGNOSIS — E870 Hyperosmolality and hypernatremia: Secondary | ICD-10-CM | POA: Diagnosis present

## 2023-07-09 DIAGNOSIS — I129 Hypertensive chronic kidney disease with stage 1 through stage 4 chronic kidney disease, or unspecified chronic kidney disease: Secondary | ICD-10-CM | POA: Diagnosis present

## 2023-07-09 DIAGNOSIS — Z95828 Presence of other vascular implants and grafts: Secondary | ICD-10-CM | POA: Diagnosis not present

## 2023-07-09 LAB — BASIC METABOLIC PANEL
BUN: 118 mg/dL — ABNORMAL HIGH (ref 8–23)
CO2: 16 mmol/L — ABNORMAL LOW (ref 22–32)
Calcium: 8.7 mg/dL — ABNORMAL LOW (ref 8.9–10.3)
Chloride: >130 mmol/L (ref 98–111)
Creatinine, Ser: 2.03 mg/dL — ABNORMAL HIGH (ref 0.44–1.00)
GFR, Estimated: 24 mL/min — ABNORMAL LOW (ref >60)
Glucose, Bld: 530 mg/dL (ref 70–99)
Potassium: 4.7 mmol/L (ref 3.5–5.1)
Sodium: 157 mmol/L — ABNORMAL HIGH (ref 135–145)

## 2023-07-09 LAB — BLOOD GAS, VENOUS
Acid-base deficit: 4.8 mmol/L — ABNORMAL HIGH (ref 0.0–2.0)
Bicarbonate: 21.1 mmol/L (ref 20.0–28.0)
O2 Saturation: 63.6 %
Patient temperature: 37
pCO2, Ven: 41 mm[Hg] — ABNORMAL LOW (ref 44–60)
pH, Ven: 7.32 (ref 7.25–7.43)
pO2, Ven: 42 mm[Hg] (ref 32–45)

## 2023-07-09 LAB — GLUCOSE, CAPILLARY
Glucose-Capillary: 200 mg/dL — ABNORMAL HIGH (ref 70–99)
Glucose-Capillary: 184 mg/dL — ABNORMAL HIGH (ref 70–99)

## 2023-07-09 LAB — CBG MONITORING, ED
Glucose-Capillary: 446 mg/dL — ABNORMAL HIGH (ref 70–99)
Glucose-Capillary: 340 mg/dL — ABNORMAL HIGH (ref 70–99)

## 2023-07-09 LAB — PHOSPHORUS: Phosphorus: 3.5 mg/dL (ref 2.5–4.6)

## 2023-07-09 LAB — MAGNESIUM: Magnesium: 2.5 mg/dL — ABNORMAL HIGH (ref 1.7–2.4)

## 2023-07-09 LAB — BETA-HYDROXYBUTYRIC ACID
Beta-Hydroxybutyric Acid: 0.16 mmol/L (ref 0.05–0.27)
Beta-Hydroxybutyric Acid: 0.12 mmol/L (ref 0.05–0.27)
Beta-Hydroxybutyric Acid: 0.07 mmol/L (ref 0.05–0.27)

## 2023-07-09 LAB — TSH: TSH: 0.773 u[IU]/mL (ref 0.350–4.500)

## 2023-07-09 MED ORDER — HYDRALAZINE HCL 20 MG/ML IJ SOLN: 10.0000 mg | Freq: Four times a day (QID) | INTRAMUSCULAR | Status: DC | PRN

## 2023-07-09 MED ORDER — INSULIN ASPART 100 UNIT/ML IJ SOLN
0.0000 [IU] | Freq: Three times a day (TID) | INTRAMUSCULAR | Status: DC
Start: 2023-07-09 — End: 2023-07-10
  Administered 2023-07-09: 11 [IU] via SUBCUTANEOUS
  Administered 2023-07-09: 3 [IU] via SUBCUTANEOUS
  Administered 2023-07-10: 2 [IU] via SUBCUTANEOUS
  Filled 2023-07-09: qty 0.15

## 2023-07-09 MED ORDER — ENSURE ENLIVE PO LIQD
237.0000 mL | Freq: Two times a day (BID) | ORAL | Status: DC
  Administered 2023-07-10 (×2): 237 mL via ORAL

## 2023-07-09 MED ORDER — LEVETIRACETAM IN NACL 500 MG/100ML IV SOLN
500.0000 mg | Freq: Two times a day (BID) | INTRAVENOUS | Status: DC
  Administered 2023-07-09 – 2023-07-10 (×3): 500 mg via INTRAVENOUS
  Filled 2023-07-09 (×4): qty 100

## 2023-07-09 MED ORDER — INSULIN ASPART 100 UNIT/ML IJ SOLN
0.0000 [IU] | Freq: Every day | INTRAMUSCULAR | Status: DC
Start: 2023-07-09 — End: 2023-07-10
  Filled 2023-07-09: qty 0.05

## 2023-07-09 MED ORDER — ONDANSETRON HCL 4 MG PO TABS
4.0000 mg | ORAL_TABLET | Freq: Four times a day (QID) | ORAL | Status: DC | PRN
Start: 2023-07-09 — End: 2023-07-10

## 2023-07-09 MED ORDER — SODIUM CHLORIDE 0.45 % IV SOLN: INTRAVENOUS | Status: AC

## 2023-07-09 MED ORDER — LACTATED RINGERS IV BOLUS
20.0000 mL/kg | Freq: Once | INTRAVENOUS | Status: AC
  Administered 2023-07-09: 726 mL via INTRAVENOUS

## 2023-07-09 MED ORDER — SODIUM CHLORIDE 0.9 % IV SOLN
1.0000 g | INTRAVENOUS | Status: DC
  Administered 2023-07-09: 1 g via INTRAVENOUS
  Filled 2023-07-09: qty 10

## 2023-07-09 MED ORDER — ONDANSETRON HCL 4 MG/2ML IJ SOLN: 4.0000 mg | Freq: Four times a day (QID) | INTRAMUSCULAR | Status: DC | PRN

## 2023-07-09 NOTE — Progress Notes (Signed)
 [facility] Liaison Note  Received request from [place], Transitions of Care Manager, for hospice services at home after discharge. Spoke with [name], daughter, to initiate education related to hospice philosophy, services, and team approach to care. Patient/family verbalized understanding of information given. Per discussion, the plan is for discharge home as soon as possible.  DME needs discussed. Patient has a hospital bed that needs lower rails and a working crank system, wheelchair, bedside commode, wheeled and nonwheeled walker in the home. Patient/family requests the following equipment for delivery: Working hospital bed with upper and lower rails. The address has been verified and is correct in the chart. [name] is the family contact to arrange time of equipment delivery.  [name] is aware that she will also receive a call to schedule our first hospice assessment visit, which will likely occur on Monday.   Please send signed and completed DNR home with patient/family. Please provide prescriptions at discharge as needed to ensure ongoing symptom management.   [facility] information and contact numbers given to [name]. Above information shared with Worthy Rancher, Transitions of Care Manager. Please call with any questions or concerns.   Thank you for the opportunity to participate in this patient's care.   Glenna Fellows BSN, Charity fundraiser, OCN [employer] [phone]

## 2023-07-09 NOTE — ED Notes (Signed)
 Stopped D5%/45%NaCl but 850 ml had already ran in.  Started LR.
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
## 2023-07-09 NOTE — ED Notes (Signed)
 Palliative care Dr in room discussing care options with pts son.  Also, IV team in room working on starting another IV for pt.  She states pts IV was leaking so it was removed and she is working on opposite side.  She will pull labs needed for me.

## 2023-07-09 NOTE — Progress Notes (Signed)
 Initial Nutrition Assessment  DOCUMENTATION CODES:   Severe malnutrition in context of chronic illness (significant dementia)  INTERVENTION:  If diet advanced from NPO: Glucerna Shake po TID, each supplement provides 220 kcal and 10 grams of protein   Continue to offer tolerated foods for meals to encourage intake and comfort  NUTRITION DIAGNOSIS:   Severe Malnutrition related to chronic illness (significant dementia) as evidenced by energy intake < or equal to 75% for > or equal to 1 month, severe fat depletion, severe muscle depletion, percent weight loss.  GOAL:  Patient will meet greater than or equal to 90% of their needs  MONITOR:  Weight trends, Diet advancement  REASON FOR ASSESSMENT:  Consult Assessment of nutrition requirement/status, Poor PO  ASSESSMENT:   Pt with hx of significant dementia, HTN, HLD, GERD, diverticulitis, AKI (05/2022), CKD 3, sepsis d/t UTI (01/2023), and diabetes. Pt hx of peripheral vascular thrombectomy 05/2022. Admitted from home by her son for poor PO and declining medications at home, diagnosed FTT.  Spoke with pt's daughter at bedside during assessment. Pt awake but not oriented and could not speak. Pt's daughter reports pt stopped eating 48 hours ago and would not take any medications. Pt has not been responding to family for about a week.  PTA, pt's daughter reports pt would be fed 3 meals per day which consisted of comfort and familiar foods to pt like chicken, mashed potatoes, and green beans. Pt's daughter reports she regularly monitored pt's intake to avoid issues with glucose, pt's kidneys, or pt's HTN. In the week leading up to admission, pt's daughter noticed pt was not chewing or swallowing solid foods anymore and teeth were starting to fall out so pt's daughter began offering baby foods instead. Pt's daughter reported offering a variety of baby foods every 2 hours throughout the day from Monday until Friday when pt was brought to the  hospital. Pt would drink 1-2 [employer] Protein shakes per day and pt's daughter reported pt had spiked glucose with Ensure and would like to avoid those. Discussed offering Glucerna as diet advances to help calorie and protein intake.   Pt's wt had dropped about 6# from June of 2024 to September of 2024, but remained stable after that until January 2025. Pt's daughter reported noticing drastic weight loss recently and said pt is "just bones". From 05/26/23 at pt's last office visit, wt went from 48.5kg to 36.3kg (taken 07/08/23) which shows 12.2kg (26.8#) loss in 6 weeks. This indicates a 25% weight loss in 6 weeks which is clinically significant. Nutrition focused physical exam shows severe fat and muscle depletions.  Pt daughter reports pt was bedbound at baseline and requires full assistance from family. Pt's daughter reports pt has sacral wound which may be infected and is concerned about sepsis. Wound has not been charted on previously.  Medications reviewed and include: novolog  Labs reviewed: CBG 340, sodium 157, chloride >130, Magnesium 2.5, GFR 24  NUTRITION - FOCUSED PHYSICAL EXAM:  Flowsheet Row Most Recent Value  Orbital Region Severe depletion  Upper Arm Region Severe depletion  Thoracic and Lumbar Region Severe depletion  Buccal Region Severe depletion  Temple Region Severe depletion  Clavicle Bone Region Severe depletion  Clavicle and Acromion Bone Region Severe depletion  Scapular Bone Region Severe depletion  Dorsal Hand Severe depletion  Patellar Region Severe depletion  Anterior Thigh Region Severe depletion  Posterior Calf Region Severe depletion  Edema (RD Assessment) None  Hair Reviewed  Eyes Reviewed  Mouth Reviewed  [  missing most of her teeth, no dentures]  Skin Reviewed  Nails Reviewed   Diet Order:   Diet Order             Diet NPO time specified  Diet effective now                   EDUCATION NEEDS:  Not appropriate for education at this  time  Skin:  Skin Assessment: Skin Integrity Issues: Skin Integrity Issues:: Other (Comment) Other: pt's daughter reported sacral wound on pt which has yet to be addressed and daughter was concerned of sepsis from wound infection  Last BM:  unknown  Height:  Ht Readings from Last 1 Encounters:  07/08/23 4\' 11"  (1.499 m)    Weight:  Wt Readings from Last 1 Encounters:  07/08/23 36.3 kg    Ideal Body Weight:  44.8 kg  BMI:  Body mass index is 16.16 kg/m.  Estimated Nutritional Needs:   Kcal:  1300-1500  Protein:  60-70g  Fluid:  1.3-1.5L  Louis Meckel Dietetic Intern
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
## 2023-07-09 NOTE — ED Notes (Signed)
 Pt's HCPOA at this time has refused the NM Hepatobiliary Liver Function.  I spoke with her in pts room and relayed message to [place], in [place]

## 2023-07-09 NOTE — ED Provider Notes (Signed)
 Patient signed out to me by Dr. Charm Barges with ultrasound pending.  Patient with failure to thrive, acute kidney injury, mild hyperkalemia, hypernatremia and hemoconcentration with UTI.  CT scan raised concern for gallbladder disease.  Ultrasound now shows mild gallbladder wall thickening with a stone, no Murphy sign.  HIDA scan recommended and has been ordered.  Patient had been administered Rocephin for her UTI already.  Patient had been given a fluid bolus and insulin/dextrose to treat her electrolyte metabolic abnormalities.  Discussed with hospitalist who agreed to admit, asked for repeat basic metabolic panel.  For some reason it took many hours to obtain this be met.  It has now returned at 8 AM and patient now with further acidosis and elevation of glucose.  Will place on insulin drip for hyperglycemia, possible DKA.  Beta hydroxybutyric acid ordered.  Awaiting hospitalist admission.   Gilda Crease, MD 07/09/23 [phone]

## 2023-07-09 NOTE — Care Management (Addendum)
 Transition of Care [facility]) - Emergency Department Mini Assessment   Patient Details  Name: Brandy Gonzales MRN: [id number] Date of Birth: 18-Jul-1940  Transition of Care [facility]) CM/SW Contact:    Lavenia Atlas, RN Phone Number: 07/09/2023, 1:08 PM   Clinical Narrative: Received secure chat for assistance with home hospice services, No TOC consult on file. Per chart review patient has PMH: dementia, HTN, hyperlipidemia, GERD, CKD, glaucoma,seizure disorder, bedbound at baseline. Patient currently at [facility] ED. This RNCM spoke with patient's HCPOA: daughter Theodoro Parma with family (patient's son, pastor and daughter inlaw) at bedside. HCPOA reports they understand what hospice is however unsure of options. [name] reports patient currently has home health services with [facility], unsure of which services. [name] also reports prior to admission patient is bedbound and has DME: wheelchair and hospital bed with [employer] (hospital bed: needs crank and siderails). This RNCM offered home hospice choice ([email or web address]) list. HCPOA chose [facility] for home hospice services. HCPOA requesting if patient's neurologist is aware patient is in the hospital, this RNCM advised I'm unaware.  This RNCM notified Shawn with [facility] of home hospice referral. Ines Bloomer will contact patient's HCPOA daughter [name].  Per chart review patient is be admitted inpatient to room 1535 for further eval and management.  - 2:20pm Received message from [facility] with [facility] who reports [facility] will follow for home hospice with DME delivery today.   TOC will continue to follow for needs.     ED Mini Assessment: What brought you to the Emergency Department? : Patient's family reports patient has not been taking her oral medications, eating or drinking.  Barriers to Discharge: Continued Medical Work up  [employer] interventions: Coordinate hospice services     Interventions which prevented an admission or readmission: Hospice    Patient  Contact and Communications        ,          Patient states their goals for this hospitalization and ongoing recovery are:: to obtain hospice services and feel better CMS Medicare.gov Compare Post Acute Care list provided to:: Patient Represenative (must comment) Choice offered to / list presented to : [facility] POA / Guardian (Daughter: Theodoro Parma)  Admission diagnosis:  Failure to thrive in adult [R62.7] Patient Active Problem List   Diagnosis Date Noted   Failure to thrive in adult 07/09/2023   Elevated d-dimer 05/01/2023   Hypertensive emergency 05/01/2023   Chest pain 04/30/2023   Hypertensive urgency 04/30/2023   History of pyelonephritis 04/30/2023   Controlled type 2 diabetes mellitus without complication, without long-term current use of insulin (HCC) 04/30/2023   Constipation 04/26/2023   Debility 04/26/2023   Pyelonephritis 04/25/2023   Seizure disorder (HCC) 04/01/2023   Alzheimer's dementia (HCC) 02/10/2023   Metabolic encephalopathy 02/08/2023   Unspecified Escherichia coli (E. coli) as the cause of diseases classified elsewhere 02/08/2023   History of Bell's palsy 10/15/2022   Hematoma of left foot 10/15/2022   Hematoma 10/09/2022   Normocytic anemia 06/11/2022   History of DVT (deep vein thrombosis) 05/18/2022   Current long-term use of anticoagulant medication with history of deep venous thrombosis 04/2022 (DVT) 05/18/2022   History of insertion of IVC (inferior vena caval) filter 2006 05/18/2022   Swelling of left foot 05/18/2022   Hyperlipidemia, unspecified 05/11/2022   Type 2 diabetes mellitus with diabetic chronic kidney disease (HCC) 05/06/2022   Long term (current) use of oral hypoglycemic drugs 05/06/2022   Protein-calorie malnutrition, severe (HCC) 04/28/2022   Dyslipidemia 04/11/2020   Abdominal enlargement  04/05/2018   Stage 3b chronic kidney disease (CKD) (HCC) 11/30/2017   Leg wound, right 08/08/2015   OAB (overactive bladder) 04/22/2015   Leg  wound, left 11/01/2014   Edema 11/01/2014   Seborrheic dermatitis 09/26/2013   Dysphagia 01/23/2013   Syncope 10/03/2012   Well adult exam 09/22/2012   Nonspecific abnormal electrocardiogram (ECG) (EKG) 09/22/2012   Osteoarthritis 05/19/2012   Glaucoma 09/10/2011   Dysuria 07/17/2009   Diabetic nephropathy (HCC) 06/27/2008   HTN (hypertension) 04/27/2007   GERD (gastroesophageal reflux disease) 04/27/2007   Long term (current) use of aspirin 05/12/1999   Personal history of (healed) traumatic fracture 05/12/1999   Personal history of other venous thrombosis and embolism 05/12/1999   Presence of other vascular implants and grafts 05/12/1999   PCP:  [name], [name], MD Pharmacy:   [id number] - [place], [place] - [address] AT [facility] [address] [place] [address] Phone: [phone] Fax: [phone]  [id number] - [place] - [address]  ([place]) [place] [address] Phone: [phone] Fax: [phone]  [facility][id number] [place], [place] - [address] AT [facility] [address] [place] [place] [address] [place] [address] Phone: [phone] Fax: [phone]  [facility] [address] [place] [address] Phone: [phone] Fax: [phone]
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
## 2023-07-10 DIAGNOSIS — R627 Adult failure to thrive: Secondary | ICD-10-CM | POA: Diagnosis not present

## 2023-07-10 DIAGNOSIS — G301 Alzheimer's disease with late onset: Secondary | ICD-10-CM | POA: Diagnosis not present

## 2023-07-10 DIAGNOSIS — G40909 Epilepsy, unspecified, not intractable, without status epilepticus: Secondary | ICD-10-CM | POA: Diagnosis not present

## 2023-07-10 DIAGNOSIS — E43 Unspecified severe protein-calorie malnutrition: Secondary | ICD-10-CM | POA: Diagnosis not present

## 2023-07-10 DIAGNOSIS — F02C18 Dementia in other diseases classified elsewhere, severe, with other behavioral disturbance: Secondary | ICD-10-CM

## 2023-07-10 LAB — COMPREHENSIVE METABOLIC PANEL
ALT: 40 U/L (ref 0–44)
AST: 35 U/L (ref 15–41)
Albumin: 2.4 g/dL — ABNORMAL LOW (ref 3.5–5.0)
Alkaline Phosphatase: 117 U/L (ref 38–126)
BUN: 97 mg/dL — ABNORMAL HIGH (ref 8–23)
CO2: 15 mmol/L — ABNORMAL LOW (ref 22–32)
Calcium: 9.4 mg/dL (ref 8.9–10.3)
Chloride: >130 mmol/L (ref 98–111)
Creatinine, Ser: 1.68 mg/dL — ABNORMAL HIGH (ref 0.44–1.00)
GFR, Estimated: 30 mL/min — ABNORMAL LOW (ref >60)
Glucose, Bld: 131 mg/dL — ABNORMAL HIGH (ref 70–99)
Potassium: 4.8 mmol/L (ref 3.5–5.1)
Sodium: 163 mmol/L (ref 135–145)
Total Bilirubin: 0.8 mg/dL (ref 0.0–1.2)
Total Protein: 5.4 g/dL — ABNORMAL LOW (ref 6.5–8.1)

## 2023-07-10 LAB — CBC
HCT: 46.9 % — ABNORMAL HIGH (ref 36.0–46.0)
Hemoglobin: 13.7 g/dL (ref 12.0–15.0)
MCH: 31.6 pg (ref 26.0–34.0)
MCHC: 29.2 g/dL — ABNORMAL LOW (ref 30.0–36.0)
MCV: 108.1 fL — ABNORMAL HIGH (ref 80.0–100.0)
Platelets: 90 10*3/uL — ABNORMAL LOW (ref 150–400)
RBC: 4.34 MIL/uL (ref 3.87–5.11)
RDW: 14.3 % (ref 11.5–15.5)
WBC: 16 10*3/uL — ABNORMAL HIGH (ref 4.0–10.5)
nRBC: 0 % (ref 0.0–0.2)

## 2023-07-10 LAB — GLUCOSE, CAPILLARY
Glucose-Capillary: 114 mg/dL — ABNORMAL HIGH (ref 70–99)
Glucose-Capillary: 133 mg/dL — ABNORMAL HIGH (ref 70–99)
Glucose-Capillary: 187 mg/dL — ABNORMAL HIGH (ref 70–99)

## 2023-07-10 MED ORDER — SODIUM CHLORIDE 0.45 % IV SOLN: INTRAVENOUS | Status: DC

## 2023-07-10 NOTE — TOC Initial Note (Signed)
 Transition of Care [facility]) - Initial/Assessment Note    Patient Details  Name: Brandy Gonzales MRN: [id number] Date of Birth: January 07, 1941  Transition of [facility]) CM/SW Contact:    Adrian Prows, RN Phone Number: 07/10/2023, 4:22 PM  Clinical Narrative:                 D/C orders received; spoke w/ pt's dtr Brandy Gonzales [phone]); she confirmed d/c address: [address] [place] [address]; transport by [facility] as she is a [facility] patient; [facility] called at [phone]; spoke w/ Operator # [id number]; she was notified [facility] patient; she verbalized understanding; no TOC needs.    Barriers to Discharge: No Barriers Identified   Patient Goals and CMS Choice Patient states their goals for this hospitalization and ongoing recovery are:: to obtain hospice services and feel better CMS Medicare.gov Compare Post Acute Care list provided to:: Patient Represenative (must comment) Choice offered to / list presented to : [facility] POA / Guardian (Daughter: Brandy Gonzales)      Expected Discharge Plan and Services         Expected Discharge Date: 07/10/23                                    Prior Living Arrangements/Services                       Activities of Daily Living   ADL Screening (condition at time of admission) Independently performs ADLs?: No Does the patient have a NEW difficulty with bathing/dressing/toileting/self-feeding that is expected to last >3 days?: No Does the patient have a NEW difficulty with getting in/out of bed, walking, or climbing stairs that is expected to last >3 days?: No Does the patient have a NEW difficulty with communication that is expected to last >3 days?: No Is the patient deaf or have difficulty hearing?: Yes Does the patient have difficulty seeing, even when wearing glasses/contacts?: No Does the patient have difficulty concentrating, remembering, or making decisions?: Yes  Permission Sought/Granted                  Emotional Assessment               Admission diagnosis:  Hyperkalemia [E87.5] Lower urinary tract infectious disease [N39.0] Hypernatremia [E87.0] Failure to thrive in adult [R62.7] Acute encephalopathy [G93.40] AKI (acute kidney injury) (HCC) [N17.9] Patient Active Problem List   Diagnosis Date Noted   Failure to thrive in adult 07/09/2023   DNR (do not resuscitate) 07/09/2023   Lower urinary tract infectious disease 07/09/2023   Counseling and coordination of care 07/09/2023   Goals of care, counseling/discussion 07/09/2023   Palliative care encounter 07/09/2023   Elevated d-dimer 05/01/2023   Hypertensive emergency 05/01/2023   Chest pain 04/30/2023   Hypertensive urgency 04/30/2023   History of pyelonephritis 04/30/2023   Controlled type 2 diabetes mellitus without complication, without long-term current use of insulin (HCC) 04/30/2023   Constipation 04/26/2023   Debility 04/26/2023   Pyelonephritis 04/25/2023   Seizure disorder (HCC) 04/01/2023   Alzheimer's dementia (HCC) 02/10/2023   Metabolic encephalopathy 02/08/2023   Unspecified Escherichia coli (E. coli) as the cause of diseases classified elsewhere 02/08/2023   History of Bell's palsy 10/15/2022   Hematoma of left foot 10/15/2022   Hematoma 10/09/2022   Normocytic anemia 06/11/2022   History of DVT (deep vein thrombosis) 05/18/2022   Current long-term  use of anticoagulant medication with history of deep venous thrombosis 04/2022 (DVT) 05/18/2022   History of insertion of IVC (inferior vena caval) filter 2006 05/18/2022   Swelling of left foot 05/18/2022   Hyperlipidemia, unspecified 05/11/2022   Type 2 diabetes mellitus with diabetic chronic kidney disease (HCC) 05/06/2022   Long term (current) use of oral hypoglycemic drugs 05/06/2022   Protein-calorie malnutrition, severe (HCC) 04/28/2022   Dyslipidemia 04/11/2020   Abdominal enlargement 04/05/2018   Stage 3b chronic kidney disease (CKD) (HCC) 11/30/2017   Leg wound, right 08/08/2015    OAB (overactive bladder) 04/22/2015   Leg wound, left 11/01/2014   Edema 11/01/2014   Seborrheic dermatitis 09/26/2013   Dysphagia 01/23/2013   Syncope 10/03/2012   Well adult exam 09/22/2012   Nonspecific abnormal electrocardiogram (ECG) (EKG) 09/22/2012   Osteoarthritis 05/19/2012   Glaucoma 09/10/2011   Dysuria 07/17/2009   Diabetic nephropathy (HCC) 06/27/2008   HTN (hypertension) 04/27/2007   GERD (gastroesophageal reflux disease) 04/27/2007   Long term (current) use of aspirin 05/12/1999   Personal history of (healed) traumatic fracture 05/12/1999   Personal history of other venous thrombosis and embolism 05/12/1999   Presence of other vascular implants and grafts 05/12/1999   PCP:  [name], [name], MD Pharmacy:   [id number] [place], [place] - [address] AT [facility] OF [address] [place] [address] Phone: [phone] Fax: [phone]  [id number] - [place] - [address] [place] ([place] [address] Phone: [phone] Fax: [phone]  [facility][id number] [place], [place] - [address] AT [facility] OF [address] [place] [address] Phone: [phone] Fax: [phone]  [facility] [address] [place] [address] Phone: [phone] Fax: [phone]     Social Drivers of Health (SDOH) Social History: SDOH Screenings   Food Insecurity: Patient Unable To Answer (07/09/2023)  Recent Concern: Food Insecurity - Food Insecurity Present (05/14/2023)  Housing: Patient Unable To Answer (07/09/2023)  Transportation Needs: Patient Unable To Answer (07/09/2023)  Utilities: Patient Unable To Answer (07/09/2023)  Alcohol Screen: Low Risk  (05/14/2023)  Depression (PHQ2-9): High Risk (04/13/2023)  Financial Resource Strain: Medium Risk (05/14/2023)  Physical Activity: Inactive  (05/14/2023)  Social Connections: Patient Unable To Answer (07/09/2023)  Recent Concern: Social Connections - Socially Isolated (05/14/2023)  Stress: Patient Unable To Answer (05/14/2023)  Tobacco Use: Low Risk  (07/08/2023)  Health Literacy: Inadequate Health Literacy (05/14/2023)   SDOH Interventions:     Readmission Risk Interventions    05/03/2023    4:12 PM 06/12/2022   11:19 AM 05/20/2022    4:35 PM  Readmission Risk Prevention Plan  Transportation Screening  Complete Complete  PCP or Specialist Appt within 3-5 Days Complete Complete Complete  HRI or Home Care Consult Complete Complete --  Social Work Consult for Recovery Care Planning/Counseling Complete Complete Complete  Palliative Care Screening Not Applicable Not Applicable Not Applicable  Medication Review Oceanographer) Complete Complete Complete

## 2023-07-10 NOTE — Hospital Course (Addendum)
 83 year old woman PMH including advanced dementia, seizure disorder who presented with several day history of no oral intake.  Admitted for failure to thrive.  Workup was inconclusive and after further discussion with family, decision was made to go home with hospice.  Consultants None   Procedures/Events None

## 2023-07-10 NOTE — Discharge Summary (Signed)
 Physician Discharge Summary   Patient: Brandy Gonzales MRN: [id number] DOB: 1940-08-31  Admit date:     07/08/2023  Discharge date: 07/10/23  Discharge Physician: Brendia Sacks   PCP: Tresa Garter, MD   Recommendations at discharge:   Home with hospice.  Failure to thrive, not eating.  Death expected within 2 weeks.  Discharge Diagnoses: Principal Problem:   Failure to thrive in adult Active Problems:   Diabetic nephropathy (HCC)   HTN (hypertension)   Stage 3b chronic kidney disease (CKD) (HCC)   Seizure disorder (HCC)   GERD (gastroesophageal reflux disease)   Dyslipidemia   Protein-calorie malnutrition, severe (HCC)   Alzheimer's dementia (HCC)   Personal history of other venous thrombosis and embolism   DNR (do not resuscitate)   Lower urinary tract infectious disease   Counseling and coordination of care   Goals of care, counseling/discussion   Palliative care encounter  Resolved Problems:   * No resolved hospital problems. *  Hospital Course: 83 year old woman PMH including advanced dementia, seizure disorder who presented with several day history of no oral intake.  Admitted for failure to thrive.  Workup was inconclusive and after further discussion with family, decision was made to go home with hospice.  Consultants None   Procedures/Events None   Advanced dementia Failure to thrive Advanced dementia at baseline.  Stopped eating and taking medicines. Workup was inconclusive with abnormal urinalysis, CT and ultrasound. Exam was benign, clinically doubt infection, laboratory studies do not correlate. Regardless and discussion with decision maker daughter power of attorney, decision was made for discharge home with hospice and no attempts at curative treatment.  Cholelithiasis Abnormal CT and ultrasound of the right upper quadrant Afebrile, noted leukocytosis.  Abnormal imaging with possibility of cholecystitis.  Patient poor surgical candidate and  family elected to take patient home with hospice.   Hyperkalemia Treated in the emergency department   Hypernatremia/hyperchloremia Secondary to failure to thrive   AKI on CKD stage IIIb: Secondary to failure to thrive.   Well-controlled type 2 diabetes: -Last A1c 5.8%.   Stop medications, comfort care, hospice.   Seizure disorder: Can continue Keppra.   History of DVT/PE: Status post IVC filter   Thrombocytopenia No further evaluation suggested   Severe protein calorie malnutrition with BMI of 16  Discussed in detail with daughter at bedside.  Plan for home comfort measures.  She understands death is expected in the near future.  Medications aimed at comfort.  We discussed usual hospice medications on discharge including morphine and Ativan, daughter declined both.  She just wants to utilize Tylenol, she reports patient has multiple intolerances.      Disposition:  Home with hospice Diet recommendation:  As desired DISCHARGE MEDICATION: Allergies as of 07/10/2023       Reactions   Amlodipine Besylate Other (See Comments)   Hair loss   Atenolol Other (See Comments)   Fluid retention   Oxycodone Hcl Other (See Comments)   Dizziness   Pork-derived Products Other (See Comments)   Religious preference   Propoxyphene N-acetaminophen Other (See Comments)   Unknown reaction   Enalapril Maleate Palpitations        Medication List     STOP taking these medications    aspirin EC 81 MG tablet   carvedilol 25 MG tablet Commonly known as: COREG   glimepiride 2 MG tablet Commonly known as: AMARYL   Gvoke HypoPen 1-Pack 1 MG/0.2ML Soaj Generic drug: Glucagon   omeprazole 20 MG capsule Commonly  known as: PRILOSEC       TAKE these medications    acetaminophen 500 MG tablet Commonly known as: TYLENOL Take 500-1,000 mg by mouth every 6 (six) hours as needed for moderate pain (pain score 4-6).   levETIRAcetam 500 MG tablet Commonly known as: KEPPRA Take 1  tablet (500 mg total) by mouth 2 (two) times daily.       Does not speak  Discharge Exam: Filed Weights   07/08/23 1507  Weight: 36.3 kg   Physical Exam Vitals reviewed.  Constitutional:      General: She is not in acute distress.    Appearance: She is not ill-appearing or toxic-appearing.  Cardiovascular:     Rate and Rhythm: Normal rate and regular rhythm.     Heart sounds: No murmur heard. Pulmonary:     Effort: Pulmonary effort is normal. No respiratory distress.     Breath sounds: No wheezing, rhonchi or rales.  Neurological:     Mental Status: She is alert.     Comments: Does not speak or respond to examiner but eyes are open and she makes eye contact.      Condition at discharge: fair  The results of significant diagnostics from this hospitalization (including imaging, microbiology, ancillary and laboratory) are listed below for reference.   Imaging Studies: US Abdomen Limited RUQ (LIVER/GB) Result Date: 07/09/2023 CLINICAL DATA:  Epigastric abdominal pain EXAM: ULTRASOUND ABDOMEN LIMITED RIGHT UPPER QUADRANT COMPARISON:  Same day CT abdomen and pelvis FINDINGS: Gallbladder: Stones and sludge in the gallbladder with gallbladder wall thickening measuring 4 mm. Negative sonographic Murphy's sign. No pericholecystic fluid. Common bile duct: Diameter: 4 mm.  No intrahepatic biliary dilation. Liver: No focal lesion identified. Within normal limits in parenchymal echogenicity. Portal vein is patent on color Doppler imaging with normal direction of blood flow towards the liver. Other: None. IMPRESSION: Cholelithiasis with gallbladder wall thickening. Negative sonographic Murphy's sign. Findings are suspicious but not definitive for acute cholecystitis. Consider HIDA scan. Electronically Signed   By: Minerva Fester M.D.   On: 07/09/2023 00:55   CT ABDOMEN PELVIS WO CONTRAST Result Date: 07/08/2023 CLINICAL DATA:  Acute nonlocalized abdominal pain. Not eating or drinking for 2  days. Decreased level of responsiveness. EXAM: CT ABDOMEN AND PELVIS WITHOUT CONTRAST TECHNIQUE: Multidetector CT imaging of the abdomen and pelvis was performed following the standard protocol without IV contrast. RADIATION DOSE REDUCTION: This exam was performed according to the departmental dose-optimization program which includes automated exposure control, adjustment of the mA and/or kV according to patient size and/or use of iterative reconstruction technique. COMPARISON:  04/30/2023 FINDINGS: Lower chest: Fibrosis or atelectasis in the lung bases. Probable emphysematous changes. Hepatobiliary: Stones or sludge in the gallbladder with mild gallbladder distention and wall thickening. Changes may represent acute cholecystitis in the appropriate clinical setting. No bile duct dilatation. No focal liver lesions. Pancreas: Unremarkable. No pancreatic ductal dilatation or surrounding inflammatory changes. Spleen: Normal in size without focal abnormality. Adrenals/Urinary Tract: Adrenal glands are unremarkable. Kidneys are normal, without renal calculi, focal lesion, or hydronephrosis. Bladder wall is mildly thickened, possibly cystitis. Correlate with urinalysis. Stomach/Bowel: Stomach and small bowel are decompressed. Scattered stool in the colon without distention. Colonic diverticulosis without evidence of acute diverticulitis. No wall thickening or inflammatory changes are seen. The appendix is normal. Vascular/Lymphatic: Calcification of the aorta. No aneurysm. No significant lymphadenopathy. Inferior vena caval filter is in place. Reproductive: Status post hysterectomy. No adnexal masses. Other: No free air or free fluid in the  abdomen. Abdominal wall musculature appears intact. Edema in the subcutaneous fat. No loculated collections. Musculoskeletal: Previous internal fixation of the left hip. Old fracture deformity of the left inferior pubic ramus. Degenerative changes in the spine. Curvilinear calcification  in the humeral heads, greater on the right, suggesting avascular necrosis. IMPRESSION: 1. Stones or sludge in the gallbladder with mild gallbladder distention and wall thickening may indicate cholecystitis. No bile duct dilatation. 2. Bladder wall thickening may indicate cystitis. Correlate with urinalysis. 3. Aortic atherosclerosis.  Vena cava filter in place. 4. No evidence of bowel obstruction or inflammation. 5. Calcification in the femoral heads, greater on the right, possibly avascular necrosis. 6. Emphysematous changes and fibrosis in the lung bases. Electronically Signed   By: Burman Nieves M.D.   On: 07/08/2023 21:46   CT Head Wo Contrast Result Date: 07/08/2023 CLINICAL DATA:  Altered level of consciousness EXAM: CT HEAD WITHOUT CONTRAST TECHNIQUE: Contiguous axial images were obtained from the base of the skull through the vertex without intravenous contrast. RADIATION DOSE REDUCTION: This exam was performed according to the departmental dose-optimization program which includes automated exposure control, adjustment of the mA and/or kV according to patient size and/or use of iterative reconstruction technique. COMPARISON:  04/25/2023 FINDINGS: Brain: Extensive hypodensities throughout the periventricular and subcortical white matter consistent with chronic small vessel ischemic changes, stable. No evidence of acute infarct or hemorrhage. Lateral ventricles and midline structures are stable. No acute extra-axial fluid collections. No mass effect. Stable cerebral atrophy. Vascular: Stable atherosclerosis of the internal carotid arteries. No hyperdense vessel. Skull: Normal. Negative for fracture or focal lesion. Sinuses/Orbits: No acute finding. Other: None. IMPRESSION: 1. No acute intracranial process. 2. Diffuse cerebral atrophy, with extensive chronic small-vessel ischemic changes as above. Electronically Signed   By: Sharlet Salina M.D.   On: 07/08/2023 21:41   DG Chest Port 1 View Result Date:  07/08/2023 CLINICAL DATA:  Weakness.  Not eating or drinking for 2 days. EXAM: PORTABLE CHEST 1 VIEW COMPARISON:  05/03/2023 FINDINGS: Heart size and pulmonary vascularity are normal. Emphysematous changes and scattered fibrosis in the lungs. Vague nodular opacity over the right lower lung probably represents a prominent nipple shadow. No pleural effusion or pneumothorax. Mediastinal contours appear intact. Calcification of the aorta. Degenerative changes in the spine and shoulders. IMPRESSION: Emphysematous changes in the lungs. No evidence of active pulmonary disease. Electronically Signed   By: Burman Nieves M.D.   On: 07/08/2023 18:30    Microbiology: Results for orders placed or performed during the hospital encounter of 07/08/23  Resp panel by RT-PCR (RSV, Flu A&B, Covid) Anterior Nasal Swab     Status: None   Collection Time: 07/08/23  4:16 PM   Specimen: Anterior Nasal Swab  Result Value Ref Range Status   SARS Coronavirus 2 by RT PCR NEGATIVE NEGATIVE Final    Comment: (NOTE) SARS-CoV-2 target nucleic acids are NOT DETECTED.  The SARS-CoV-2 RNA is generally detectable in upper respiratory specimens during the acute phase of infection. The lowest concentration of SARS-CoV-2 viral copies this assay can detect is 138 copies/mL. A negative result does not preclude SARS-Cov-2 infection and should not be used as the sole basis for treatment or other patient management decisions. A negative result may occur with  improper specimen collection/handling, submission of specimen other than nasopharyngeal swab, presence of viral mutation(s) within the areas targeted by this assay, and inadequate number of viral copies(<138 copies/mL). A negative result must be combined with clinical observations, patient history, and epidemiological information.  The expected result is Negative.  Fact Sheet for Patients:  BloggerCourse.com  Fact Sheet for Healthcare Providers:   SeriousBroker.it  This test is no t yet approved or cleared by the [place] FDA and  has been authorized for detection and/or diagnosis of SARS-CoV-2 by FDA under an Emergency Use Authorization (EUA). This EUA will remain  in effect (meaning this test can be used) for the duration of the COVID-19 declaration under Section 564(b)(1) of the Act, 21 U.S.C.section 360bbb-3(b)(1), unless the authorization is terminated  or revoked sooner.       Influenza A by PCR NEGATIVE NEGATIVE Final   Influenza B by PCR NEGATIVE NEGATIVE Final    Comment: (NOTE) The Xpert Xpress SARS-CoV-2/FLU/RSV plus assay is intended as an aid in the diagnosis of influenza from Nasopharyngeal swab specimens and should not be used as a sole basis for treatment. Nasal washings and aspirates are unacceptable for Xpert Xpress SARS-CoV-2/FLU/RSV testing.  Fact Sheet for Patients: BloggerCourse.com  Fact Sheet for Healthcare Providers: SeriousBroker.it  This test is not yet approved or cleared by the [place] FDA and has been authorized for detection and/or diagnosis of SARS-CoV-2 by FDA under an Emergency Use Authorization (EUA). This EUA will remain in effect (meaning this test can be used) for the duration of the COVID-19 declaration under Section 564(b)(1) of the Act, 21 U.S.C. section 360bbb-3(b)(1), unless the authorization is terminated or revoked.     Resp Syncytial Virus by PCR NEGATIVE NEGATIVE Final    Comment: (NOTE) Fact Sheet for Patients: BloggerCourse.com  Fact Sheet for Healthcare Providers: SeriousBroker.it  This test is not yet approved or cleared by the [place] FDA and has been authorized for detection and/or diagnosis of SARS-CoV-2 by FDA under an Emergency Use Authorization (EUA). This EUA will remain in effect (meaning this test can be used) for  the duration of the COVID-19 declaration under Section 564(b)(1) of the Act, 21 U.S.C. section 360bbb-3(b)(1), unless the authorization is terminated or revoked.  Performed at [facility], [address], [place], [place] [address]   Urine Culture (for pregnant, neutropenic or urologic patients or patients with an indwelling urinary catheter)     Status: Abnormal (Preliminary result)   Collection Time: 07/08/23  8:42 PM   Specimen: Urine, Clean Catch  Result Value Ref Range Status   Specimen Description   Final    URINE, CLEAN CATCH Performed at [facility], [address], [place], [place] [address]    Special Requests   Final    NONE Performed at [facility], [address] [place], [place] [address]    Culture (A)  Final    >=100,000 COLONIES/mL ESCHERICHIA COLI SUSCEPTIBILITIES TO FOLLOW CULTURE REINCUBATED FOR BETTER GROWTH Performed at [facility], [address], [place], [place] [address]    Report Status PENDING  Incomplete    Labs: CBC: Recent Labs  Lab 07/08/23 1639 07/10/23 0510  WBC 15.5* 16.0*  NEUTROABS 12.9*  --   HGB 14.4 13.7  HCT 47.2* 46.9*  MCV 103.5* 108.1*  PLT 91* 90*   Basic Metabolic Panel: Recent Labs  Lab 07/08/23 1812 07/09/23 0657 07/09/23 0947 07/10/23 0510  NA 159* 157*  --  163*  K 5.8* 4.7  --  4.8  CL >130* >130*  --  >130*  CO2 20* 16*  --  15*  GLUCOSE 299* 530*  --  131*  BUN 120* 118*  --  97*  CREATININE  2.50* 2.03*  --  1.68*  CALCIUM 8.6* 8.7*  --  9.4  MG 3.0*  --  2.5*  --   PHOS  --   --  3.5  --    Liver Function Tests: Recent Labs  Lab 07/08/23 1812 07/10/23 0510  AST 23 35  ALT 40 40  ALKPHOS 104 117  BILITOT 1.7* 0.8  PROT 5.2* 5.4*  ALBUMIN 2.5* 2.4*   CBG: Recent Labs  Lab 07/09/23 1246 07/09/23 1730 07/09/23 2054 07/10/23 0803 07/10/23 1210  GLUCAP 340* 200* 184* 114* 133*    Discharge time spent: greater than 30  minutes.  Signed: Brendia Sacks, MD [facility] 07/10/2023

## 2023-07-12 ENCOUNTER — Telehealth: Payer: Self-pay | Admitting: *Deleted

## 2023-07-12 DIAGNOSIS — I82402 Acute embolism and thrombosis of unspecified deep veins of left lower extremity: Secondary | ICD-10-CM | POA: Diagnosis not present

## 2023-07-12 DIAGNOSIS — F03B0 Unspecified dementia, moderate, without behavioral disturbance, psychotic disturbance, mood disturbance, and anxiety: Secondary | ICD-10-CM | POA: Diagnosis not present

## 2023-07-12 DIAGNOSIS — I10 Essential (primary) hypertension: Secondary | ICD-10-CM | POA: Diagnosis not present

## 2023-07-12 DIAGNOSIS — G40909 Epilepsy, unspecified, not intractable, without status epilepticus: Secondary | ICD-10-CM | POA: Diagnosis not present

## 2023-07-12 DIAGNOSIS — F0150 Vascular dementia without behavioral disturbance: Secondary | ICD-10-CM | POA: Diagnosis not present

## 2023-07-12 DIAGNOSIS — E785 Hyperlipidemia, unspecified: Secondary | ICD-10-CM | POA: Diagnosis not present

## 2023-07-12 DIAGNOSIS — I679 Cerebrovascular disease, unspecified: Secondary | ICD-10-CM | POA: Diagnosis not present

## 2023-07-12 DIAGNOSIS — N1832 Chronic kidney disease, stage 3b: Secondary | ICD-10-CM | POA: Diagnosis not present

## 2023-07-12 DIAGNOSIS — L8995 Pressure ulcer of unspecified site, unstageable: Secondary | ICD-10-CM | POA: Diagnosis not present

## 2023-07-12 DIAGNOSIS — E43 Unspecified severe protein-calorie malnutrition: Secondary | ICD-10-CM | POA: Diagnosis not present

## 2023-07-12 DIAGNOSIS — E119 Type 2 diabetes mellitus without complications: Secondary | ICD-10-CM | POA: Diagnosis not present

## 2023-07-12 DIAGNOSIS — N10 Acute tubulo-interstitial nephritis: Secondary | ICD-10-CM | POA: Diagnosis not present

## 2023-07-12 LAB — URINE CULTURE: Culture: >=100000 — AB

## 2023-07-12 NOTE — Transitions of Care (Post Inpatient/ED Visit) (Signed)
 07/12/2023  Name: Brandy Gonzales MRN: [id number] DOB: 03/30/1941  Today's TOC FU Call Status: Today's TOC FU Call Status:: Successful TOC FU Call Completed TOC FU Call Complete Date: 07/12/23 Patient's Name and Date of Birth confirmed.  Transition Care Management Follow-up Telephone Call Date of Discharge: 07/10/23 Discharge Facility: [facility]) Type of Discharge: Inpatient Admission Primary Inpatient Discharge Diagnosis:: Failure to Thrive: discharged home with hospice services through [facility] How have you been since you were released from the hospital?: Same ("Things are about the same, hospice team is coming this afternoon for the initial visit.  I will probably change the Wednesday 07/14/22 office visit with Dr. Demetrius Charity to a virtual or telephone appointment; the nurse can call me on Friday 07/16/23 as scheduled") Any questions or concerns?: No  Items Reviewed: Did you receive and understand the discharge instructions provided?: Yes Medications obtained,verified, and reconciled?: No Medications Not Reviewed Reasons:: Other: (confirmed hospice services at home are active; confirmed no concerns or questions around medications) Any new allergies since your discharge?: No Dietary orders reviewed?: No Do you have support at home?: Yes People in Home: child(ren), adult Name of Support/Comfort Primary Source: Reports independent in self-care activities; supportive local daughter and grandchildren assists as/ if needed/ indicated  Medications Reviewed Today: Medications Reviewed Today     Reviewed by Michaela Corner, RN (Registered Nurse) on 07/12/23 at 1245  Med List Status: <None>   Medication Order Taking? Sig Documenting Provider Last Dose Status Informant  acetaminophen (TYLENOL) 500 MG tablet [id number] No Take 500-1,000 mg by mouth every 6 (six) hours as needed for moderate pain (pain score 4-6). [provider] Past Month Active Child, Pharmacy Records  levETIRAcetam  (KEPPRA) 500 MG tablet [id number] No Take 1 tablet (500 mg total) by mouth 2 (two) times daily. Windell Norfolk, MD 07/07/2023 Active Child, Pharmacy Records  Med List Note Epimenio Sarin, Angelica Pou 01/30/23 2536): Daughter handles medications.            Home Care and Equipment/Supplies: Were Home Health Services Ordered?: No Any new equipment or medical supplies ordered?: No  Functional Questionnaire: Do you need assistance with bathing/showering or dressing?: Yes (family assisting with all self-care needs; verified hospice services at home are active with initial home visit scheduled this afternoon 07/12/23) Do you need assistance with meal preparation?: Yes (family assisting with all self-care needs; verified hospice services at home are active with initial home visit scheduled this afternoon 07/12/23) Do you need assistance with eating?: Yes (family assisting with all self-care needs; verified hospice services at home are active with initial home visit scheduled this afternoon 07/12/23) Do you have difficulty maintaining continence: Yes (family assisting with all self-care needs; verified hospice services at home are active with initial home visit scheduled this afternoon 07/12/23) Do you need assistance with getting out of bed/getting out of a chair/moving?: Yes (family assisting with all self-care needs; verified hospice services at home are active with initial home visit scheduled this afternoon 07/12/23) Do you have difficulty managing or taking your medications?: Yes (family assisting with all self-care needs; verified hospice services at home are active with initial home visit scheduled this afternoon 07/12/23)  Follow up appointments reviewed: PCP Follow-up appointment confirmed?: Yes Date of PCP follow-up appointment?: 07/14/23 Follow-up Provider: PCP: daughter reports she plans to change this appointment to either virtual or telephone- confirms hospice services are active but reports, "they  told us Dr. Posey Rea could still be involved" Specialist Hospital Follow-up appointment confirmed?: NA (verified  not indicated per hospital discharging provider discharge notes) Do you need transportation to your follow-up appointment?: No Do you understand care options if your condition(s) worsen?: Yes-patient verbalized understanding  SDOH Interventions Today    Flowsheet Row Most Recent Value  SDOH Interventions   Food Insecurity Interventions Patient Declined  [caregiver declined- assessment deferred: verified patient is active with hospice services at home]  Housing Interventions Intervention Not Indicated  Transportation Interventions Intervention Not Indicated  [caregiver declined- assessment deferred: verified patient is active with hospice services at home]  Utilities Interventions Patient Declined  [caregiver declined- assessment deferred: verified patient is active with hospice services at home]      Interventions Today    Flowsheet Row Most Recent Value  Chronic Disease   Chronic disease during today's visit Other  [Failure to Thrive]  General Interventions   General Interventions Discussed/Reviewed General Interventions Discussed, Doctor Visits, Communication with  Doctor Visits Discussed/Reviewed PCP, Doctor Visits Discussed  Communication with PCP/Specialists, RN  [made PCP and longitudinal RN CM aware that patient was discharged from hospital on 07/10/23 with hospice services at home-- daughter wants to keep upcoming appointments with PCP on 07/14/23 and with RN CM on 07/16/23]  Education Interventions   Education Provided Provided Education  Provided Verbal Education On Other  [Reinforcement around role of hospice team]      TOC Interventions Today    Flowsheet Row Most Recent Value  TOC Interventions   TOC Interventions Discussed/Reviewed TOC Interventions Discussed  [verified patient is active with hospice services at home post-hospital discharge on 07/10/23: she is  currently active with longitudinal RN CM with 07/16/23 telephone visit that daughter wishes to maintain]      Total time spent from review to signing of note/ including any care coordination interventions:  34 minutes  Pls call/ message for questions,  Caryl Pina, RN, BSN, Media planner  Transitions of Care  [facility] [phone]: direct office

## 2023-07-13 DIAGNOSIS — G40909 Epilepsy, unspecified, not intractable, without status epilepticus: Secondary | ICD-10-CM | POA: Diagnosis not present

## 2023-07-13 DIAGNOSIS — I10 Essential (primary) hypertension: Secondary | ICD-10-CM | POA: Diagnosis not present

## 2023-07-13 DIAGNOSIS — N1832 Chronic kidney disease, stage 3b: Secondary | ICD-10-CM | POA: Diagnosis not present

## 2023-07-13 DIAGNOSIS — F03B0 Unspecified dementia, moderate, without behavioral disturbance, psychotic disturbance, mood disturbance, and anxiety: Secondary | ICD-10-CM | POA: Diagnosis not present

## 2023-07-13 DIAGNOSIS — I679 Cerebrovascular disease, unspecified: Secondary | ICD-10-CM | POA: Diagnosis not present

## 2023-07-13 DIAGNOSIS — E119 Type 2 diabetes mellitus without complications: Secondary | ICD-10-CM | POA: Diagnosis not present

## 2023-07-14 ENCOUNTER — Ambulatory Visit: Payer: Medicare Other | Admitting: Internal Medicine

## 2023-07-14 DIAGNOSIS — I679 Cerebrovascular disease, unspecified: Secondary | ICD-10-CM | POA: Diagnosis not present

## 2023-07-14 DIAGNOSIS — I10 Essential (primary) hypertension: Secondary | ICD-10-CM | POA: Diagnosis not present

## 2023-07-14 DIAGNOSIS — F03B0 Unspecified dementia, moderate, without behavioral disturbance, psychotic disturbance, mood disturbance, and anxiety: Secondary | ICD-10-CM | POA: Diagnosis not present

## 2023-07-14 DIAGNOSIS — G40909 Epilepsy, unspecified, not intractable, without status epilepticus: Secondary | ICD-10-CM | POA: Diagnosis not present

## 2023-07-14 DIAGNOSIS — N1832 Chronic kidney disease, stage 3b: Secondary | ICD-10-CM | POA: Diagnosis not present

## 2023-07-14 DIAGNOSIS — E119 Type 2 diabetes mellitus without complications: Secondary | ICD-10-CM | POA: Diagnosis not present

## 2023-07-15 DIAGNOSIS — I10 Essential (primary) hypertension: Secondary | ICD-10-CM | POA: Diagnosis not present

## 2023-07-15 DIAGNOSIS — F03B0 Unspecified dementia, moderate, without behavioral disturbance, psychotic disturbance, mood disturbance, and anxiety: Secondary | ICD-10-CM | POA: Diagnosis not present

## 2023-07-15 DIAGNOSIS — G40909 Epilepsy, unspecified, not intractable, without status epilepticus: Secondary | ICD-10-CM | POA: Diagnosis not present

## 2023-07-15 DIAGNOSIS — N1832 Chronic kidney disease, stage 3b: Secondary | ICD-10-CM | POA: Diagnosis not present

## 2023-07-15 DIAGNOSIS — I679 Cerebrovascular disease, unspecified: Secondary | ICD-10-CM | POA: Diagnosis not present

## 2023-07-15 DIAGNOSIS — E119 Type 2 diabetes mellitus without complications: Secondary | ICD-10-CM | POA: Diagnosis not present

## 2023-07-16 ENCOUNTER — Ambulatory Visit: Payer: Self-pay

## 2023-07-16 ENCOUNTER — Telehealth: Payer: Self-pay

## 2023-07-16 DIAGNOSIS — G40909 Epilepsy, unspecified, not intractable, without status epilepticus: Secondary | ICD-10-CM | POA: Diagnosis not present

## 2023-07-16 DIAGNOSIS — I10 Essential (primary) hypertension: Secondary | ICD-10-CM | POA: Diagnosis not present

## 2023-07-16 DIAGNOSIS — E119 Type 2 diabetes mellitus without complications: Secondary | ICD-10-CM | POA: Diagnosis not present

## 2023-07-16 DIAGNOSIS — N1832 Chronic kidney disease, stage 3b: Secondary | ICD-10-CM | POA: Diagnosis not present

## 2023-07-16 DIAGNOSIS — F03B0 Unspecified dementia, moderate, without behavioral disturbance, psychotic disturbance, mood disturbance, and anxiety: Secondary | ICD-10-CM | POA: Diagnosis not present

## 2023-07-16 DIAGNOSIS — I679 Cerebrovascular disease, unspecified: Secondary | ICD-10-CM | POA: Diagnosis not present

## 2023-07-16 NOTE — Patient Outreach (Signed)
 Care Coordination   Follow Up Visit Note   07/16/2023 Name: BELISSA KOOY MRN: [id number] DOB: 1940/07/03  AMIKA TASSIN is a 83 y.o. year old female who sees [name], [name], MD for primary care. I spoke with daughter, Theodoro Parma, by phone today.  What matters to the patients health and wellness today?  Received return call from patient daughter, who reports patient is with Hospice care. She denies any additional needs.   Goals Addressed             This Visit's Progress    COMPLETED: Maintain or improve health       Interventions Today    Flowsheet Row Most Recent Value  General Interventions   General Interventions Discussed/Reviewed General Interventions Reviewed  [assessed if any additonal care management needs. confirmed patient remains active with Hospice. active listening and support. case closed]            SDOH assessments and interventions completed:  No  Care Coordination Interventions:  Yes, provided   Follow up plan: No further intervention required.   Encounter Outcome:  Patient Visit Completed   Kathyrn Sheriff, RN, MSN, BSN, CCM   [facility], Population Health Case Manager Phone: [phone]

## 2023-07-16 NOTE — Patient Outreach (Signed)
 Care Coordination   07/16/2023 Name: Brandy Gonzales MRN: [id number] DOB: 07-Aug-1940   Care Coordination Outreach Attempts:  An unsuccessful outreach was attempted for an appointment today. No answer. HIPAA compliant voice message left. Per review of chart, patient is active with Hospice.   Follow Up Plan:  Await return call. No further outreach attempts will be made at this time.   Encounter Outcome:  No Answer   Care Coordination Interventions:  No, not indicated    Kathyrn Sheriff, RN, MSN, BSN, CCM [place]  [facility], Population Health Case Manager Phone: [phone]

## 2023-07-17 DIAGNOSIS — N1832 Chronic kidney disease, stage 3b: Secondary | ICD-10-CM | POA: Diagnosis not present

## 2023-07-17 DIAGNOSIS — I10 Essential (primary) hypertension: Secondary | ICD-10-CM | POA: Diagnosis not present

## 2023-07-17 DIAGNOSIS — G40909 Epilepsy, unspecified, not intractable, without status epilepticus: Secondary | ICD-10-CM | POA: Diagnosis not present

## 2023-07-17 DIAGNOSIS — E119 Type 2 diabetes mellitus without complications: Secondary | ICD-10-CM | POA: Diagnosis not present

## 2023-07-17 DIAGNOSIS — F03B0 Unspecified dementia, moderate, without behavioral disturbance, psychotic disturbance, mood disturbance, and anxiety: Secondary | ICD-10-CM | POA: Diagnosis not present

## 2023-07-17 DIAGNOSIS — I679 Cerebrovascular disease, unspecified: Secondary | ICD-10-CM | POA: Diagnosis not present

## 2023-07-21 ENCOUNTER — Telehealth: Payer: Self-pay

## 2023-07-21 NOTE — Telephone Encounter (Signed)
 Copied from CRM [id number]. Topic: General - Deceased Patient >> July 22, 2023 11:21 AM Pascal Lux wrote: Name of caller: Melvyn Neth  Date of death: Jul 18, 2023   Name of funeral home: [facility]  Phone number of funeral home: [phone]  Provider that needs to sign form: Dr. Posey Rea  Timeline for signing: N/A

## 2023-07-22 NOTE — Telephone Encounter (Signed)
 Done

## 2023-08-10 DEATH — deceased

## 2024-01-19 ENCOUNTER — Ambulatory Visit: Payer: Medicare Other | Admitting: Neurology
